# Patient Record
Sex: Male | Born: 1945 | Race: White | Hispanic: No | Smoking: Former smoker
Health system: Southern US, Community
[De-identification: ages and names within clinical notes are randomized; demographics above are authoritative.]

## PROBLEM LIST (undated history)

## (undated) DIAGNOSIS — I739 Peripheral vascular disease, unspecified: Secondary | ICD-10-CM

## (undated) DIAGNOSIS — I83009 Varicose veins of unspecified lower extremity with ulcer of unspecified site: Secondary | ICD-10-CM

## (undated) DIAGNOSIS — N390 Urinary tract infection, site not specified: Secondary | ICD-10-CM

## (undated) DIAGNOSIS — Z515 Encounter for palliative care: Secondary | ICD-10-CM

## (undated) DIAGNOSIS — M109 Gout, unspecified: Secondary | ICD-10-CM

## (undated) DIAGNOSIS — N179 Acute kidney failure, unspecified: Secondary | ICD-10-CM

## (undated) DIAGNOSIS — Z8719 Personal history of other diseases of the digestive system: Secondary | ICD-10-CM

## (undated) DIAGNOSIS — I10 Essential (primary) hypertension: Secondary | ICD-10-CM

## (undated) DIAGNOSIS — E7800 Pure hypercholesterolemia, unspecified: Secondary | ICD-10-CM

## (undated) DIAGNOSIS — N189 Chronic kidney disease, unspecified: Secondary | ICD-10-CM

## (undated) DIAGNOSIS — I5032 Chronic diastolic (congestive) heart failure: Secondary | ICD-10-CM

## (undated) DIAGNOSIS — L02419 Cutaneous abscess of limb, unspecified: Secondary | ICD-10-CM

## (undated) DIAGNOSIS — M1990 Unspecified osteoarthritis, unspecified site: Secondary | ICD-10-CM

## (undated) DIAGNOSIS — I482 Chronic atrial fibrillation, unspecified: Secondary | ICD-10-CM

## (undated) DIAGNOSIS — R739 Hyperglycemia, unspecified: Secondary | ICD-10-CM

## (undated) DIAGNOSIS — R600 Localized edema: Secondary | ICD-10-CM

## (undated) DIAGNOSIS — I4891 Unspecified atrial fibrillation: Secondary | ICD-10-CM

## (undated) DIAGNOSIS — J9601 Acute respiratory failure with hypoxia: Secondary | ICD-10-CM

## (undated) DIAGNOSIS — J810 Acute pulmonary edema: Secondary | ICD-10-CM

## (undated) DIAGNOSIS — L97909 Non-pressure chronic ulcer of unspecified part of unspecified lower leg with unspecified severity: Secondary | ICD-10-CM

## (undated) DIAGNOSIS — R0902 Hypoxemia: Secondary | ICD-10-CM

## (undated) DIAGNOSIS — A419 Sepsis, unspecified organism: Secondary | ICD-10-CM

## (undated) DIAGNOSIS — R609 Edema, unspecified: Secondary | ICD-10-CM

## (undated) DIAGNOSIS — L03119 Cellulitis of unspecified part of limb: Secondary | ICD-10-CM

## (undated) DIAGNOSIS — J9602 Acute respiratory failure with hypercapnia: Secondary | ICD-10-CM

## (undated) DIAGNOSIS — G9340 Encephalopathy, unspecified: Secondary | ICD-10-CM

## (undated) DIAGNOSIS — Z9989 Dependence on other enabling machines and devices: Secondary | ICD-10-CM

## (undated) DIAGNOSIS — G4733 Obstructive sleep apnea (adult) (pediatric): Secondary | ICD-10-CM

## (undated) DIAGNOSIS — D649 Anemia, unspecified: Secondary | ICD-10-CM

## (undated) DIAGNOSIS — Z7189 Other specified counseling: Secondary | ICD-10-CM

## (undated) DIAGNOSIS — L89300 Pressure ulcer of unspecified buttock, unstageable: Secondary | ICD-10-CM

## (undated) HISTORY — DX: Encounter for palliative care: Z51.5

## (undated) HISTORY — DX: Localized edema: R60.0

## (undated) HISTORY — DX: Sepsis, unspecified organism: A41.9

## (undated) HISTORY — DX: Varicose veins of unspecified lower extremity with ulcer of unspecified site: I83.009

## (undated) HISTORY — DX: Acute respiratory failure with hypoxia: J96.01

## (undated) HISTORY — PX: NO PAST SURGERIES: SHX2092

## (undated) HISTORY — DX: Acute respiratory failure with hypercapnia: J96.02

## (undated) HISTORY — DX: Other specified counseling: Z71.89

## (undated) HISTORY — DX: Obstructive sleep apnea (adult) (pediatric): G47.33

## (undated) HISTORY — DX: Dependence on other enabling machines and devices: Z99.89

## (undated) HISTORY — DX: Personal history of other diseases of the digestive system: Z87.19

## (undated) HISTORY — DX: Hypoxemia: R09.02

## (undated) HISTORY — DX: Unspecified atrial fibrillation: I48.91

## (undated) HISTORY — DX: Urinary tract infection, site not specified: N39.0

## (undated) HISTORY — DX: Edema, unspecified: R60.9

## (undated) HISTORY — DX: Encephalopathy, unspecified: G93.40

## (undated) HISTORY — DX: Essential (primary) hypertension: I10

## (undated) HISTORY — DX: Acute pulmonary edema: J81.0

## (undated) HISTORY — DX: Varicose veins of unspecified lower extremity with ulcer of unspecified site: L97.909

---

## 2002-12-08 DIAGNOSIS — I5032 Chronic diastolic (congestive) heart failure: Secondary | ICD-10-CM

## 2002-12-08 HISTORY — DX: Chronic diastolic (congestive) heart failure: I50.32

## 2003-01-27 ENCOUNTER — Ambulatory Visit (HOSPITAL_COMMUNITY): Admission: RE | Admit: 2003-01-27 | Discharge: 2003-01-27 | Payer: Self-pay | Admitting: Internal Medicine

## 2003-01-27 ENCOUNTER — Encounter: Payer: Self-pay | Admitting: Internal Medicine

## 2004-08-08 ENCOUNTER — Ambulatory Visit: Payer: Self-pay | Admitting: *Deleted

## 2004-08-16 ENCOUNTER — Ambulatory Visit: Payer: Self-pay | Admitting: Family Medicine

## 2004-11-20 ENCOUNTER — Ambulatory Visit: Payer: Self-pay | Admitting: Family Medicine

## 2004-12-04 ENCOUNTER — Ambulatory Visit: Payer: Self-pay | Admitting: Family Medicine

## 2005-04-08 ENCOUNTER — Ambulatory Visit: Payer: Self-pay | Admitting: Family Medicine

## 2005-05-01 ENCOUNTER — Ambulatory Visit: Payer: Self-pay | Admitting: Family Medicine

## 2005-06-02 ENCOUNTER — Ambulatory Visit: Payer: Self-pay | Admitting: Family Medicine

## 2011-08-12 ENCOUNTER — Ambulatory Visit: Payer: Self-pay | Admitting: Cardiology

## 2011-09-10 ENCOUNTER — Encounter: Payer: Self-pay | Admitting: Cardiology

## 2011-09-11 ENCOUNTER — Ambulatory Visit: Payer: Self-pay | Admitting: Cardiology

## 2011-09-11 ENCOUNTER — Encounter (HOSPITAL_BASED_OUTPATIENT_CLINIC_OR_DEPARTMENT_OTHER): Payer: Medicare HMO | Attending: Internal Medicine

## 2011-10-09 ENCOUNTER — Encounter (HOSPITAL_BASED_OUTPATIENT_CLINIC_OR_DEPARTMENT_OTHER): Payer: Self-pay

## 2011-11-07 ENCOUNTER — Ambulatory Visit: Payer: Self-pay | Admitting: Cardiology

## 2011-11-27 ENCOUNTER — Ambulatory Visit: Payer: Self-pay | Admitting: Cardiology

## 2012-01-01 ENCOUNTER — Ambulatory Visit: Payer: Self-pay | Admitting: Cardiology

## 2012-01-29 ENCOUNTER — Ambulatory Visit: Payer: Self-pay | Admitting: Cardiology

## 2012-03-04 ENCOUNTER — Ambulatory Visit: Payer: Self-pay | Admitting: Cardiology

## 2012-04-20 ENCOUNTER — Encounter: Payer: Self-pay | Admitting: *Deleted

## 2012-04-26 ENCOUNTER — Ambulatory Visit (INDEPENDENT_AMBULATORY_CARE_PROVIDER_SITE_OTHER): Payer: Self-pay | Admitting: Cardiology

## 2012-04-26 ENCOUNTER — Encounter: Payer: Self-pay | Admitting: Cardiology

## 2012-04-26 VITALS — BP 180/104 | HR 80 | Ht 69.0 in | Wt 323.1 lb

## 2012-04-26 DIAGNOSIS — I509 Heart failure, unspecified: Secondary | ICD-10-CM

## 2012-04-26 DIAGNOSIS — R0602 Shortness of breath: Secondary | ICD-10-CM

## 2012-04-26 DIAGNOSIS — I10 Essential (primary) hypertension: Secondary | ICD-10-CM

## 2012-04-26 DIAGNOSIS — I4891 Unspecified atrial fibrillation: Secondary | ICD-10-CM

## 2012-04-26 MED ORDER — APIXABAN 5 MG PO TABS: 5.0000 mg | ORAL_TABLET | Freq: Two times a day (BID) | ORAL | Status: DC

## 2012-04-26 MED ORDER — CARVEDILOL 25 MG PO TABS: 25.0000 mg | ORAL_TABLET | Freq: Two times a day (BID) | ORAL | Status: DC

## 2012-04-26 MED ORDER — POTASSIUM CHLORIDE 20 MEQ PO PACK: 20.0000 meq | PACK | Freq: Every day | ORAL | Status: DC

## 2012-04-26 MED ORDER — FUROSEMIDE 40 MG PO TABS: 40.0000 mg | ORAL_TABLET | Freq: Every day | ORAL | Status: DC

## 2012-04-26 NOTE — Patient Instructions (Signed)
Your physician recommends that you schedule a follow-up appointment in: 2 weeks with Dr. Shirlee Latch.  (OK to overbook)  Your physician has requested that you have an echocardiogram in 1 week.. Echocardiography is a painless test that uses sound waves to create images of your heart. It provides your doctor with information about the size and shape of your heart and how well your heart's chambers and valves are working. This procedure takes approximately one hour. There are no restrictions for this procedure.  Your physician recommends that you return for lab work in: 1 week  BMET, BNP, Lipids  Stop Metoprolol  Try to get Eliquis (Apixiban) if too expensive, call us to change.  Start Coreg 25mg  twice daily  Lasix 40mg  daily  Potassium chloride daily

## 2012-04-27 ENCOUNTER — Telehealth: Payer: Self-pay | Admitting: Cardiology

## 2012-04-27 DIAGNOSIS — I4891 Unspecified atrial fibrillation: Secondary | ICD-10-CM | POA: Insufficient documentation

## 2012-04-27 NOTE — Telephone Encounter (Addendum)
Per pt he has a bad reaction to is Lebatolol please let Dr. Shirlee Latch. Pt also wanted to let us know that the apixaban is a little expensive can he get something that is cheaper

## 2012-04-27 NOTE — Telephone Encounter (Signed)
Patient called no answer.LMTC. 

## 2012-04-27 NOTE — Assessment & Plan Note (Addendum)
Uncontrolled HTN x years.  Likely this played a significant role in his atrial fibrillation.  As above, stopping metoprolol and using high dose Coreg.  Also starting Lasix.  Will likely need additional medication titration.  I will await echo and LV fxn evaluation to decide on this.

## 2012-04-27 NOTE — Assessment & Plan Note (Addendum)
Jesus [name] is in atrial fibrillation today and was in atrial fibrillation in 8/12.  Rate is controlled on tid metoprolol.  He is not on anticoagulation. CHADSVASC score is at least 3, so he ought to be on anticoagulation.  Transportation was an issue for coumadin.   - Start apixaban 5 mg bid (check creatinine today).  If too expensive, will assess for rivaroxaban or dabigatran.  - Given severe HTN, will stop metoprolol and have him take Coreg 25 mg bid.  - Cardioversion would be an option in the future (needs at least 3 wks apixaban prior).

## 2012-04-27 NOTE — Assessment & Plan Note (Addendum)
Patient is significantly volume overloaded on exam today with elevate neck veins and impressive edema.  He has chronic orthopnea.  I am going to start him on Lasix 40 mg daily + KCl 20 mEq daily.  I will get an echo to assess LV systolic function.  BMET/BNP in 1 week.   I will check lipids.

## 2012-04-27 NOTE — Progress Notes (Signed)
PCP: Dr. Shanda Bumps Copland  66 yo with history of atrial fibrillation and CHF presents for cardiology evaluation.  Jesus [name] has a long history of lower extremity edema.  He was told as early as 2003 that he has CHF but never saw a cardiologist prior to today.  He was seen by Dr. Patsy Lager in 8/12 at [facility] and was noted to be in atrial fibrillation.  CXR showed cardiomegaly.  He was started on coumadin but stopped it and did not followup again at Urgent Care due to transportation problems for INR checks.  He is in atrial fibrillation still today.  He is not very mobile because of obesity and severe bilateral knee pain from arthritis.  He has some exertional dyspnea but greater limiting factor is knee pain.  He has chronic orthopnea and sleeps in a recliner.  No recent PND.  No chest pain.  BP today is 180/104, which he says is good for him. Usually when checked, SBP runs in the 200s-210s.   ECG: atrial fibrillation at 83, LAFB, lateral TWIs, poor anterior R wave progression.   Labs (8/12): K 4.6, creatinine 0.62, BNP 227, HCT 39.9  PMH: 1. Obesity 2. Atrial fibrillation: First noted in 8/12, probably persistent.  3. CHF 4. HTN: since late teens, poor control.  5. Chronic lower extremity edema.  6. OA knees: severe.   FH: Father with atrial fibrillation.   SH: Divorced.  Lives in [place].  Used to be an appliance repairman but now on disability.  Nonsmoker.    ROS: All systems reviewed and negative except as per HPI.   Current Outpatient Prescriptions  Medication Sig Dispense Refill  . lisinopril (PRINIVIL,ZESTRIL) 40 MG tablet Take 0.5 tablets (20 mg total) by mouth 2 (two) times daily. 1/2 tablet dail      . apixaban (ELIQUIS) 5 MG TABS tablet Take 1 tablet (5 mg total) by mouth 2 (two) times daily.  60 tablet  3  . carvedilol (COREG) 25 MG tablet Take 1 tablet (25 mg total) by mouth 2 (two) times daily with a meal.  60 tablet  3  . furosemide (LASIX) 40 MG tablet Take 1 tablet (40  mg total) by mouth daily.  30 tablet  3  . potassium chloride (K-LOR) 20 MEQ packet Take 20 mEq by mouth daily.  30 tablet  3    BP 180/104  Pulse 80  Ht 5\' 9"  (1.753 m)  Wt 323 lb 1.9 oz (146.566 kg)  BMI 47.72 kg/m2 General: NAD, obese.  Neck: JVP 12-14 cm, no thyromegaly or thyroid nodule.  Lungs: Clear to auscultation bilaterally with normal respiratory effort. CV: Nondisplaced PMI.  Heart irregular S1/S2, no S3/S4, 1/6 SEM RUSB. 2+ chronic edema to knees bilaterally.  No carotid bruit.  Unable to palpate pedal pulses due to edema. .  Abdomen: Soft, nontender, no hepatosplenomegaly, no distention.  Skin: Intact without lesions or rashes.  Neurologic: Alert and oriented x 3.  Psych: Normal affect. Extremities: No clubbing or cyanosis.  HEENT: Normal.

## 2012-04-28 NOTE — Telephone Encounter (Signed)
Patient called back was told Dr.McLean wanted to know name of blood pressure medication he could not take.States he took Labetalol 10 months ago but had to stop due to it making him feel bad.Also stated apixaban is too expensive.Fowarded to Dr.McLean for advice.

## 2012-04-28 NOTE — Telephone Encounter (Signed)
Patient retuning nurse CP call, he can be reached at [phone]. Patient will be home for the next hour

## 2012-04-28 NOTE — Telephone Encounter (Signed)
Patient called phone rings busy. 

## 2012-04-28 NOTE — Telephone Encounter (Signed)
Patient called no answer.LMTC. 

## 2012-04-28 NOTE — Telephone Encounter (Signed)
Can check with his insurance on Xarelto 20 mg daily.  If that is too expensive, will need to do coumadin.

## 2012-04-30 NOTE — Telephone Encounter (Signed)
Patient called no answer.LMTC. 

## 2012-05-05 ENCOUNTER — Encounter: Payer: Self-pay | Admitting: Cardiology

## 2012-05-05 NOTE — Telephone Encounter (Signed)
LMTCB

## 2012-05-06 MED ORDER — APIXABAN 5 MG PO TABS: 5.0000 mg | ORAL_TABLET | Freq: Two times a day (BID) | ORAL | Status: DC

## 2012-05-06 MED ORDER — RIVAROXABAN 20 MG PO TABS: 20.0000 mg | ORAL_TABLET | Freq: Every day | ORAL | Status: DC

## 2012-05-06 NOTE — Telephone Encounter (Signed)
Spoke with pt again. Xarelto 20mg  is going to be $45 the same as apixaban. Pt is still trying to make a decision about apixaban/ xarelto or coumadin. We discussed and I emphasized the importance of taking one of these medications and stroke risk with at fib. Pt states for right now he will take apixaban but will call me back today if he decides on coumadin. Pt states he is not having a problem with any other medication. I emphasized the importance of coming for lab and echo 05/07/12.

## 2012-05-06 NOTE — Telephone Encounter (Signed)
Spoke with pt

## 2012-05-07 ENCOUNTER — Ambulatory Visit (INDEPENDENT_AMBULATORY_CARE_PROVIDER_SITE_OTHER): Payer: 59 | Admitting: *Deleted

## 2012-05-07 ENCOUNTER — Other Ambulatory Visit: Payer: Self-pay

## 2012-05-07 ENCOUNTER — Telehealth: Payer: Self-pay | Admitting: Cardiology

## 2012-05-07 ENCOUNTER — Ambulatory Visit (HOSPITAL_COMMUNITY): Payer: Medicare FFS | Attending: Cardiology

## 2012-05-07 DIAGNOSIS — I10 Essential (primary) hypertension: Secondary | ICD-10-CM

## 2012-05-07 DIAGNOSIS — R0989 Other specified symptoms and signs involving the circulatory and respiratory systems: Secondary | ICD-10-CM | POA: Insufficient documentation

## 2012-05-07 DIAGNOSIS — I4891 Unspecified atrial fibrillation: Secondary | ICD-10-CM

## 2012-05-07 DIAGNOSIS — I509 Heart failure, unspecified: Secondary | ICD-10-CM | POA: Insufficient documentation

## 2012-05-07 DIAGNOSIS — R0609 Other forms of dyspnea: Secondary | ICD-10-CM | POA: Insufficient documentation

## 2012-05-07 DIAGNOSIS — I517 Cardiomegaly: Secondary | ICD-10-CM | POA: Insufficient documentation

## 2012-05-07 DIAGNOSIS — R609 Edema, unspecified: Secondary | ICD-10-CM

## 2012-05-07 LAB — BASIC METABOLIC PANEL
BUN: 15 mg/dL (ref 6–23)
Chloride: 90 mEq/L — ABNORMAL LOW (ref 96–112)
Potassium: 3.3 mEq/L — ABNORMAL LOW (ref 3.5–5.1)

## 2012-05-07 LAB — LIPID PANEL
Cholesterol: 164 mg/dL (ref 0–200)
HDL: 30.4 mg/dL — ABNORMAL LOW (ref >39.00)
VLDL: 17.4 mg/dL (ref 0.0–40.0)

## 2012-05-07 LAB — BRAIN NATRIURETIC PEPTIDE: Pro B Natriuretic peptide (BNP): 388 pg/mL — ABNORMAL HIGH (ref 0.0–100.0)

## 2012-05-07 NOTE — Telephone Encounter (Signed)
Error

## 2012-05-11 ENCOUNTER — Other Ambulatory Visit: Payer: Self-pay | Admitting: *Deleted

## 2012-05-11 DIAGNOSIS — I429 Cardiomyopathy, unspecified: Secondary | ICD-10-CM

## 2012-05-12 ENCOUNTER — Other Ambulatory Visit (INDEPENDENT_AMBULATORY_CARE_PROVIDER_SITE_OTHER): Payer: 59

## 2012-05-12 DIAGNOSIS — I429 Cardiomyopathy, unspecified: Secondary | ICD-10-CM

## 2012-05-12 DIAGNOSIS — I10 Essential (primary) hypertension: Secondary | ICD-10-CM

## 2012-05-12 LAB — BASIC METABOLIC PANEL
BUN: 11 mg/dL (ref 6–23)
Calcium: 9 mg/dL (ref 8.4–10.5)
GFR: 114.27 mL/min (ref >60.00)
Glucose, Bld: 142 mg/dL — ABNORMAL HIGH (ref 70–99)
Sodium: 137 mEq/L (ref 135–145)

## 2012-05-18 ENCOUNTER — Encounter: Payer: Self-pay | Admitting: Cardiology

## 2012-05-18 ENCOUNTER — Ambulatory Visit (INDEPENDENT_AMBULATORY_CARE_PROVIDER_SITE_OTHER): Payer: 59 | Admitting: Cardiology

## 2012-05-18 VITALS — BP 165/100 | HR 78 | Ht 69.0 in | Wt 336.0 lb

## 2012-05-18 DIAGNOSIS — I4891 Unspecified atrial fibrillation: Secondary | ICD-10-CM

## 2012-05-18 DIAGNOSIS — I509 Heart failure, unspecified: Secondary | ICD-10-CM

## 2012-05-18 DIAGNOSIS — I10 Essential (primary) hypertension: Secondary | ICD-10-CM

## 2012-05-18 MED ORDER — FUROSEMIDE 40 MG PO TABS: 40.0000 mg | ORAL_TABLET | Freq: Two times a day (BID) | ORAL | Status: DC

## 2012-05-18 MED ORDER — AMLODIPINE BESYLATE 5 MG PO TABS: 5.0000 mg | ORAL_TABLET | Freq: Every day | ORAL | Status: DC

## 2012-05-18 MED ORDER — WARFARIN SODIUM 5 MG PO TABS: ORAL_TABLET | ORAL | Status: DC

## 2012-05-18 MED ORDER — POTASSIUM CHLORIDE CRYS ER 20 MEQ PO TBCR: EXTENDED_RELEASE_TABLET | ORAL | Status: DC

## 2012-05-18 NOTE — Assessment & Plan Note (Signed)
Resistant HTN.  BP better but still quite high.  -  I will have him start amlodipine 5 mg daily in addition to his other meds today.   -  Resistant HTN could be due to high levels of aldosterone.  Next step will be addition of spironolactone.  - Will need eventual evaluation for OSA.    He will followup with the PA in 2 weeks and me in 1 month.

## 2012-05-18 NOTE — Assessment & Plan Note (Signed)
Jesus [name] is in atrial fibrillation still today.  Rate is controlled on Coreg.  He is still not on anticoagulation. CHADSVASC score is at least 3, so he ought to be on anticoagulation.   - Unable to afford apixaban or Xarelto.  Will start coumadin.   - Coreg for rate control.   - Cardioversion would be an option in the future (needs at least 3 wks apixaban prior).  Holding NSR may help with CHF.

## 2012-05-18 NOTE — Progress Notes (Signed)
PCP: Dr. Shanda Bumps Copland  66 yo with history of atrial fibrillation and diastolic CHF presents for cardiology evaluation.  Jesus [name] has a long history of lower extremity edema.  He was told as early as 2003 that he has CHF.  He was seen by Dr. Patsy Lager in 8/12 at [facility] and was noted to be in atrial fibrillation.  CXR showed cardiomegaly.  He was started on coumadin but stopped it and did not followup again at Urgent [facility] due to transportation problems for INR checks.  He has been in atrial fibrillation when seen in this office.  He is not very mobile because of obesity and severe bilateral knee pain from arthritis.  He has some exertional dyspnea but greater limiting factor is knee pain.  He has chronic orthopnea and sleeps in a recliner.  No recent PND.  No chest pain.  At last appointment, I adjusted his BP regimen.  BP is still high but seems to be lower on average.  I also started him on Lasix at last appointment.  Per patient, this has not affected his breathing or his lower extremity edema.  He now has some venous stasis ulcerations on his right lower leg.  He followsup with Dr. Lajoyce Corners for this.  I wanted him to start either apixaban or Xarelto for anticoagulation, but they were too expensive.  He is still not on a blood thinner.   Labs (8/12): K 4.6, creatinine 0.62, BNP 227, HCT 39.9 Labs (5/13): LDL 116, HDL 30, proBNP 388 Labs (6/13): K 3.1, creatinine 0.7, proBNP 282, immunofixation with no monoclonal protein.   PMH: 1. Obesity 2. Atrial fibrillation: First noted in 8/12, probably persistent.  3. Diastolic CHF: Echo (5/13) with severe LVH, biatrial enlargement, EF 50-55%.  4. HTN: since late teens, poor control.  5. Chronic lower extremity edema.  6. OA knees: severe.   FH: Father with atrial fibrillation.   SH: Divorced.  Lives in [place].  Used to be an appliance repairman but now on disability.  Nonsmoker.    ROS: All systems reviewed and negative except as per HPI.    Current Outpatient Prescriptions  Medication Sig Dispense Refill  . carvedilol (COREG) 25 MG tablet Take 1 tablet (25 mg total) by mouth 2 (two) times daily with a meal.  60 tablet  3  . lisinopril (PRINIVIL,ZESTRIL) 40 MG tablet Take 20 mg by mouth 2 (two) times daily.       Marland Kitchen amLODipine (NORVASC) 5 MG tablet Take 1 tablet (5 mg total) by mouth daily.  30 tablet  6  . furosemide (LASIX) 40 MG tablet Take 1 tablet (40 mg total) by mouth 2 (two) times daily.  60 tablet  6  . potassium chloride SA (K-DUR,KLOR-CON) 20 MEQ tablet Take 2 tablets twice a day  120 tablet  6  . warfarin (COUMADIN) 5 MG tablet Take one daily or as directed  30 tablet  0    BP 165/100  Pulse 78  Ht 5\' 9"  (1.753 m)  Wt 152.409 kg (336 lb)  BMI 49.62 kg/m2  SpO2 95% General: NAD, obese.  Neck: JVP 10 cm, no thyromegaly or thyroid nodule.  Lungs: Clear to auscultation bilaterally with normal respiratory effort. CV: Nondisplaced PMI.  Heart irregular S1/S2, no S3/S4, 1/6 SEM RUSB. 2+ chronic edema to knees bilaterally.  No carotid bruit.  Unable to palpate pedal pulses due to edema. .  Abdomen: Soft, nontender, no hepatosplenomegaly, no distention.  Skin: Right lower leg is erythematous  and has probable venous stasis ulcerations.   Neurologic: Alert and oriented x 3.  Psych: Normal affect. Extremities: No clubbing or cyanosis.

## 2012-05-18 NOTE — Assessment & Plan Note (Signed)
Acute on chronic diastolic CHF.  He continues to be volume overloaded on exam.  He has venous stasis ulcers on his right lower leg.  This leg is also erythematous though he says that this is a considerable improvement.  - Increase Lasix to 40 mg bid and KCl to 40 mEq bid.   - BMET/BNP on Monday.

## 2012-05-18 NOTE — Patient Instructions (Signed)
Increase lasix(furosemide) to 40mg  twice a day.  Increase KCL(potassium) to 40 mEq twice a day. This will be two 20 mEq tablets twice a day.  Start amlodipine 5mg  daily.  Start coumadin(warfarin) 5mg  daily.  Schedule an appointment in [facility] on Monday June 17,2013.  Your physician recommends that you return for lab work on Monday June 17,2013.---BMET/BNP  Your physician recommends that you schedule a follow-up appointment in: 2 weeks with Jesus [name]   Your physician recommends that you schedule a follow-up appointment in: 1 month with Jesus [name].

## 2012-05-19 ENCOUNTER — Telehealth: Payer: Self-pay | Admitting: *Deleted

## 2012-05-19 NOTE — Telephone Encounter (Signed)
I have attempted to send new prescriptions for amlodipine, warfarin, lasix, and KCl into [facility] since yesterday. I have been in contact with the pt several times to let him know I have not been in touch with [facility] either electronically or by telephone to get these prescriptions to them. Pt declined to have these filled at another pharmacy. Pt told me he would go to [facility] to see what was going on. I have not been able to contact the pt since earlier today.
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
## 2012-05-24 ENCOUNTER — Other Ambulatory Visit (INDEPENDENT_AMBULATORY_CARE_PROVIDER_SITE_OTHER): Payer: 59

## 2012-05-24 ENCOUNTER — Ambulatory Visit (INDEPENDENT_AMBULATORY_CARE_PROVIDER_SITE_OTHER): Payer: 59 | Admitting: *Deleted

## 2012-05-24 DIAGNOSIS — I4891 Unspecified atrial fibrillation: Secondary | ICD-10-CM

## 2012-05-24 DIAGNOSIS — I10 Essential (primary) hypertension: Secondary | ICD-10-CM

## 2012-05-24 DIAGNOSIS — Z7901 Long term (current) use of anticoagulants: Secondary | ICD-10-CM

## 2012-05-24 LAB — POCT INR: INR: 1.3

## 2012-05-24 NOTE — Patient Instructions (Addendum)

## 2012-05-25 LAB — BASIC METABOLIC PANEL
CO2: 30 mEq/L (ref 19–32)
Calcium: 9.1 mg/dL (ref 8.4–10.5)
Chloride: 95 mEq/L — ABNORMAL LOW (ref 96–112)
Creatinine, Ser: 0.7 mg/dL (ref 0.4–1.5)
Glucose, Bld: 91 mg/dL (ref 70–99)

## 2012-05-26 ENCOUNTER — Telehealth: Payer: Self-pay | Admitting: Cardiology

## 2012-05-26 NOTE — Telephone Encounter (Signed)
Spoke with pt. Pt states he started amlodipine about 7 days ago. He has swelling in both legs that pt states began the day he started amlodipine. The swelling in his legs has gradually progressed to above his knees to his testicles. Pt states he increased furosemide the same day he started amlodipine. Pt denies any increase in SOB. Pt has not taken amlodipine today. I will forward to Dr Shirlee Latch for review and recommendations.

## 2012-05-26 NOTE — Telephone Encounter (Signed)
New msg Pt wants to know about side effects from amlodipine. He is having some swelling. Please call back

## 2012-05-26 NOTE — Telephone Encounter (Signed)
Have him stop amlodipine (swelling is a side effect) and start spironolactone 25 mg daily with BMET in 1 week.

## 2012-05-27 ENCOUNTER — Other Ambulatory Visit: Payer: 59

## 2012-05-27 NOTE — Telephone Encounter (Signed)
LMTCB

## 2012-05-28 ENCOUNTER — Other Ambulatory Visit: Payer: Self-pay | Admitting: *Deleted

## 2012-05-28 NOTE — Telephone Encounter (Signed)
Spoke with pt. He has not taken amlodipine since 05/26/12. His swelling may have improved slightly. Pt states he does not have any other symptoms.  He will not take anymore amlodipine. He does not want to start spironolactone right now. He states he will call me back the first of the week to let me know his decision about starting spironolactone.

## 2012-05-28 NOTE — Telephone Encounter (Signed)
He is not going to get better until he gets his BP controlled.  Would call him next week to see what he decides.

## 2012-05-31 NOTE — Telephone Encounter (Signed)
LMTCB

## 2012-06-01 ENCOUNTER — Ambulatory Visit: Payer: 59 | Admitting: Physician Assistant

## 2012-06-01 ENCOUNTER — Emergency Department (HOSPITAL_COMMUNITY)
Admission: EM | Admit: 2012-06-01 | Discharge: 2012-06-01 | Disposition: A | Discharge status: Home / Self Care | Payer: Medicare HMO | Attending: Emergency Medicine | Admitting: Emergency Medicine

## 2012-06-01 ENCOUNTER — Encounter (HOSPITAL_COMMUNITY): Payer: Self-pay | Admitting: *Deleted

## 2012-06-01 DIAGNOSIS — I4891 Unspecified atrial fibrillation: Secondary | ICD-10-CM | POA: Insufficient documentation

## 2012-06-01 DIAGNOSIS — I509 Heart failure, unspecified: Secondary | ICD-10-CM | POA: Insufficient documentation

## 2012-06-01 DIAGNOSIS — Z7901 Long term (current) use of anticoagulants: Secondary | ICD-10-CM | POA: Insufficient documentation

## 2012-06-01 DIAGNOSIS — Z79899 Other long term (current) drug therapy: Secondary | ICD-10-CM | POA: Insufficient documentation

## 2012-06-01 DIAGNOSIS — N5089 Other specified disorders of the male genital organs: Secondary | ICD-10-CM | POA: Insufficient documentation

## 2012-06-01 DIAGNOSIS — R600 Localized edema: Secondary | ICD-10-CM

## 2012-06-01 DIAGNOSIS — E669 Obesity, unspecified: Secondary | ICD-10-CM | POA: Insufficient documentation

## 2012-06-01 DIAGNOSIS — R609 Edema, unspecified: Secondary | ICD-10-CM | POA: Insufficient documentation

## 2012-06-01 DIAGNOSIS — I10 Essential (primary) hypertension: Secondary | ICD-10-CM | POA: Insufficient documentation

## 2012-06-01 LAB — BASIC METABOLIC PANEL
BUN: 7 mg/dL (ref 6–23)
CO2: 32 mEq/L (ref 19–32)
Calcium: 9.5 mg/dL (ref 8.4–10.5)
Chloride: 88 mEq/L — ABNORMAL LOW (ref 96–112)
Creatinine, Ser: 0.62 mg/dL (ref 0.50–1.35)
GFR calc Af Amer: >90 mL/min (ref >90)
GFR calc non Af Amer: >90 mL/min (ref >90)
Glucose, Bld: 127 mg/dL — ABNORMAL HIGH (ref 70–99)
Potassium: 2.9 mEq/L — ABNORMAL LOW (ref 3.5–5.1)
Sodium: 131 mEq/L — ABNORMAL LOW (ref 135–145)

## 2012-06-01 LAB — URINALYSIS, ROUTINE W REFLEX MICROSCOPIC
Bilirubin Urine: NEGATIVE
Glucose, UA: NEGATIVE mg/dL
Hgb urine dipstick: NEGATIVE
Ketones, ur: NEGATIVE mg/dL
Leukocytes, UA: NEGATIVE
Nitrite: NEGATIVE
Protein, ur: 100 mg/dL — AB
Specific Gravity, Urine: 1.011 (ref 1.005–1.030)
Urobilinogen, UA: 1 mg/dL (ref 0.0–1.0)
pH: 5.5 (ref 5.0–8.0)

## 2012-06-01 LAB — PROTIME-INR
INR: 1.23 (ref 0.00–1.49)
Prothrombin Time: 15.8 seconds — ABNORMAL HIGH (ref 11.6–15.2)

## 2012-06-01 LAB — URINE MICROSCOPIC-ADD ON

## 2012-06-01 MED ORDER — POTASSIUM CHLORIDE CRYS ER 20 MEQ PO TBCR
60.0000 meq | EXTENDED_RELEASE_TABLET | Freq: Once | ORAL | Status: AC
  Administered 2012-06-01: 60 meq via ORAL
  Filled 2012-06-01: qty 3

## 2012-06-01 MED ORDER — FUROSEMIDE 10 MG/ML IJ SOLN
80.0000 mg | Freq: Once | INTRAMUSCULAR | Status: AC
  Administered 2012-06-01: 80 mg via INTRAVENOUS
  Filled 2012-06-01: qty 8

## 2012-06-01 NOTE — ED Notes (Signed)
Patient states that he has had increase in groin swelling since Friday.  Pt was recently started on new medication and has had increase in swelling in his legs, abdomen and testicles.  Pt denies any increase in SOB.  Pt complaining of pain to his groin.

## 2012-06-01 NOTE — Discharge Instructions (Signed)
Take your medications are prescribed. Your lasix (furosemide) will help with your swelling. You need to take your coumadin (warfarin) or other medications as prescribed as well. I encourage you to discuss with your PCP and cardiologist more if you have further concerns with any medications you are prescribed.  Peripheral Edema You have swelling in your legs (peripheral edema). This swelling is due to excess accumulation of salt and water in your body. Edema may be a sign of heart, kidney or liver disease, or a side effect of a medication. It may also be due to problems in the leg veins. Elevating your legs and using special support stockings may be very helpful, if the cause of the swelling is due to poor venous circulation. Avoid long periods of standing, whatever the cause. Treatment of edema depends on identifying the cause. Chips, pretzels, pickles and other salty foods should be avoided. Restricting salt in your diet is almost always needed. Water pills (diuretics) are often used to remove the excess salt and water from your body via urine. These medicines prevent the kidney from reabsorbing sodium. This increases urine flow. Diuretic treatment may also result in lowering of potassium levels in your body. Potassium supplements may be needed if you have to use diuretics daily. Daily weights can help you keep track of your progress in clearing your edema. You should call your caregiver for follow up care as recommended. SEEK IMMEDIATE MEDICAL CARE IF:   You have increased swelling, pain, redness, or heat in your legs.   You develop shortness of breath, especially when lying down.   You develop chest or abdominal pain, weakness, or fainting.   You have a fever.  Document Released: 01/01/2005 Document Revised: 11/13/2011 Document Reviewed: 12/12/2009 [facility] Patient Information 2012 [place], [place].  Edema Edema is a buildup of fluids. It is most common in the feet, ankles, and legs. This  happens more as a person ages. It may affect one or both legs. HOME CARE   Raise (elevate) the legs or ankles above the level of the heart while lying down.   Avoid sitting or standing still for a long time.   Exercise the legs to help the puffiness (swelling) go down.   A low-salt diet may help lessen the puffiness.   Only take medicine as told by your doctor.  GET HELP RIGHT AWAY IF:   You develop shortness of breath or chest pain.   You cannot breathe when you lie down.   You have more puffiness that does not go away with treatment.   You develop pain or redness in the areas that are puffy.   You have a temperature by mouth above 102 F (38.9 C), not controlled by medicine.   You gain 3 lb/1.4 kg or more in 1 day or 5 lb/2.3 kg in a week.  MAKE SURE YOU:   Understand these instructions.   Will watch your condition.   Will get help right away if you are not doing well or get worse.  Document Released: 05/12/2008 Document Revised: 11/13/2011 Document Reviewed: 05/12/2008 [facility] Patient Information 2012 [place], [place].Scrotal Swelling Scrotal swelling may occur on one or both sides of the scrotum. Pain may also occur with swelling. Early evaluation with your caregiver and treatment is important.  CAUSES   Injury.   Infection.   An ingrown hair or abrasion in the area.   Repeated rubbing from tight-fitting underwear.   Poor hygiene.   A weakened area in the muscles around the  groin (hernia). A hernia can allow abdominal contents to push into the scrotum.   Fluid around the testicle (hydrocele).   Enlarged vein around the testicle (varicocele).   Certain medical treatments or existing conditions.   A recent genital surgery or procedure.   The spermatic cord becomes twisted in the scrotum, which cuts off blood supply (testicular torsion).   Testicular cancer.  DIAGNOSIS A physical exam and medical history may be performed. You will be asked questions  about recent activity and where and when the swelling began. Further tests may be performed to confirm the diagnosis, such as an imaging test (ultrasound or MRI). TREATMENT Treatment will depend on the cause of the swelling. Treatment may include:  Self care (rest, ice, limiting activity, scrotal support).   Pain medicine.   Taking medicines to treat an infection.   Surgery or help from a specialist (urologist).  HOME CARE INSTRUCTIONS  Rest and limit activity until the swelling goes away. Lying down is the preferred position.   Put ice on the scrotum.   Put ice in a plastic bag.   Place a towel between your skin and the bag.   Leave the ice on for 15 to 20 minutes, 3 to 4 times a day for 1 to 2 days.   Place a rolled towel under the testicles for support.   Wear loose-fitting clothing or an athletic support cup for comfort.   Take all medicines as directed by your caregiver.   Perform a monthly self-exam of the scrotum and penis. Feel for changes. Ask your caregiver how to perform a monthly self-exam if you are unsure.  There are a few things you can do to help prevent injury or infection that can lead to swelling:  Wear an athletic support and protective cup during sports activities.   Practice safe sex. Wear condoms.   Follow all of your caregiver's recommendations after a surgical procedure. Discuss appropriate time frames for healing and bedrest with your surgeon.   Get vaccinated against mumps, measles, and rubella (MMR).   Use good body mechanics during activity and when lifting heavy objects. Avoid bearing down or straining.   Drink enough fluids to keep your urine clear or pale yellow. Always empty the bladder completely when urinating to avoid infection.  Finding out the results of your test Ask when your test results will be ready. Make sure you get your test results. SEEK MEDICAL CARE IF:  You have a sudden (acute) onset of pain that is persistent and not  improving.   You notice a heavy feeling or fluid in the scrotum.   You have pain or burning while urinating.   You have blood in the urine or semen.   You feel a lump around the testicle.   You notice that one testicle is larger than the other (slight variation is normal).   You have a persistent dull ache or pain in the groin or scrotum.   You need instruction on performing a monthly self-exam.  SEEK IMMEDIATE MEDICAL CARE IF:  The pain does not go away or becomes severe.   You have a fever.   You have pain or vomiting that cannot be controlled.   You notice significant redness or swelling of one or both sides of the scrotum.  MAKE SURE YOU:  Understand these instructions.   Will watch your condition.   Will get help right away if you are not doing well or get worse.  Document Released: 12/27/2010 Document  Revised: 11/13/2011 Document Reviewed: 12/27/2010 [facility] Patient Information 2012 [place], [place].Edema Edema is a buildup of fluids. It is most common in the feet, ankles, and legs. This happens more as a person ages. It may affect one or both legs. HOME CARE   Raise (elevate) the legs or ankles above the level of the heart while lying down.   Avoid sitting or standing still for a long time.   Exercise the legs to help the puffiness (swelling) go down.   A low-salt diet may help lessen the puffiness.   Only take medicine as told by your doctor.  GET HELP RIGHT AWAY IF:   You develop shortness of breath or chest pain.   You cannot breathe when you lie down.   You have more puffiness that does not go away with treatment.   You develop pain or redness in the areas that are puffy.   You have a temperature by mouth above 102 F (38.9 C), not controlled by medicine.   You gain 3 lb/1.4 kg or more in 1 day or 5 lb/2.3 kg in a week.  MAKE SURE YOU:   Understand these instructions.   Will watch your condition.   Will get help right away if you are not  doing well or get worse.  Document Released: 05/12/2008 Document Revised: 11/13/2011 Document Reviewed: 05/12/2008 [facility] Patient Information 2012 [place], [place].

## 2012-06-01 NOTE — ED Provider Notes (Signed)
History    66 rolled male with worsening lower extremity edema. Patient has a history of edema but has been worsening over the last several days. Patient's medications recently changed. Was recently started amlodipine but this was just stopped yesterday because it felt it may be contributing. Patient took himself off several his medications out of frustration. He stopped taking multiple medications including his Lasix and Coumadin. Patient with no other complaints. Denies any chest pain or shortness of breath. No fevers or chills. No urinary complaints.  CSN: [id number]  Arrival date & time 06/01/12  4098   First MD Initiated Contact with Patient 06/01/12 [id number]      Chief Complaint  Patient presents with  . Groin Swelling    (Consider location/radiation/quality/duration/timing/severity/associated sxs/prior treatment) HPI  Past Medical History  Diagnosis Date  . Hypertension   . Obese   . H/O: GI bleed   . Venous stasis ulcer   . Atrial fibrillation   . Edema of lower extremity   . CHF (congestive heart failure)     History reviewed. No pertinent past surgical history.  Family History  Problem Relation Age of Onset  . Arrhythmia Father     History  Substance Use Topics  . Smoking status: Never Smoker   . Smokeless tobacco: Not on file  . Alcohol Use: Not on file      Review of Systems   Review of symptoms negative unless otherwise noted in HPI.   Allergies  Amlodipine besylate  Home Medications   Current Outpatient Rx  Name Route Sig Dispense Refill  . CARVEDILOL 25 MG PO TABS Oral Take 25 mg by mouth 2 (two) times daily with a meal.    . FUROSEMIDE 40 MG PO TABS Oral Take 40 mg by mouth 2 (two) times daily.    Marland Kitchen LISINOPRIL 40 MG PO TABS Oral Take 20 mg by mouth 2 (two) times daily. Either takes 1/2 tablet (20mg ) twice a day or 1 tablet (40mg ) once a day    . METOPROLOL TARTRATE 100 MG PO TABS Oral Take 100 mg by mouth 2 (two) times daily.    Marland Kitchen POTASSIUM  CHLORIDE CRYS ER 20 MEQ PO TBCR Oral Take 40 mEq by mouth 2 (two) times daily.    . WARFARIN SODIUM 5 MG PO TABS Oral Take 5 mg by mouth daily.      BP 193/96  Pulse 87  Temp 97.9 F (36.6 C) (Oral)  Resp 18  SpO2 98%  Physical Exam  Nursing note and vitals reviewed. Constitutional: No distress.       Laying in bed. NAD. Obese.  HENT:  Head: Normocephalic and atraumatic.  Eyes: Conjunctivae are normal. Right eye exhibits no discharge. Left eye exhibits no discharge.  Neck: Neck supple.  Cardiovascular: Normal rate, regular rhythm and normal heart sounds.  Exam reveals no gallop and no friction rub.   No murmur heard. Pulmonary/Chest: Effort normal and breath sounds normal. No respiratory distress.  Abdominal: Soft. He exhibits no distension. There is no tenderness.  Musculoskeletal: He exhibits edema. He exhibits no tenderness.       b/l LE wrapped. Edema extending up thighs and scrotum.  Neurological: He is alert.  Skin: Skin is warm and dry.  Psychiatric: He has a normal mood and affect. His behavior is normal. Thought content normal.    ED Course  Procedures (including critical care time)  Labs Reviewed  BASIC METABOLIC PANEL - Abnormal; Notable for the following:    Sodium  131 (*)     Potassium 2.9 (*)     Chloride 88 (*)     Glucose, Bld 127 (*)     All other components within normal limits  URINALYSIS, ROUTINE W REFLEX MICROSCOPIC - Abnormal; Notable for the following:    Protein, ur 100 (*)     All other components within normal limits  PROTIME-INR - Abnormal; Notable for the following:    Prothrombin Time 15.8 (*)     All other components within normal limits  URINE MICROSCOPIC-ADD ON - Abnormal; Notable for the following:    Casts HYALINE CASTS (*)     All other components within normal limits   No results found.   1. Bilateral leg edema   2. Scrotal edema       MDM  66 year old male with worsening lower extremity edema and scrotal edema. Suspect  multifactorial. Possibly partially related to amlodipine use which was just discontinued. Patient has also chosen to stop taking his Lasix and Coumadin because he was frustrated and felt that they may be continuing to his edema as well. He did not discuss this with his prescribing physician first. Patient is not sure why he is taking the Coumadin, but I assume is because of his history of atrial fibrillation. Discussed with patient the risk of stroke and the benefit of anticoagulation. INR is normal consistent with his history of noncompliance. Pt states he will begin retaking.  Discussed the need to have an INR rechecked at the end of this week or early next week. Patient seems reasonable now that he better understands benefits of taking and that not contributing to his swelling. Explained to patient that by also not taking Lasix is probably actually making symptoms worse. He was given an IV dose here in emergency room. Understands the benefit taking this medicine as prescribed. Hypokalemia. This was repleted. Patient was instructed the importance of taking his potassium supplements at home as well. Hypertension remains poorly controlled at this time but no indication for emergent reduction. Feel that medications changes or adjustments are more appropriate for those that can continue to follow him as an outpatient. Patient does have edema, but he has no respiratory complaints. Renal function is normal. Pt does drink beer but he has no known history of hepatic dysfunction. Doubt DVT. Other medication on patient's med list was reviewed with him as well. I think a large part of his frustration is simply education. Seems to have a better understanding at this time expresses intent on being compliant. Encouraged to follow-up with his PCP and cardiologist and that he needs to discuss any concerns he has in the future concerning medicine or other aspects of his care.        Raeford Razor, MD 06/06/12 2340

## 2012-06-01 NOTE — ED Notes (Signed)
R.N notified of abnormal BP, patient a little anxious, will recheck.

## 2012-06-01 NOTE — ED Notes (Signed)
Patients abdomen is distended at this time.  Pt states that it is non tender. Pt abdomen is soft upon palpation.  Daughter states that abdomen is distended. Pt has a wound care nurse that keeps his lower extremities wrapped with coban to help reduced lower leg weeping.  Pt states that he has noticed swelling in bilateral thighs. Pitting edema noted at this time.

## 2012-06-02 ENCOUNTER — Inpatient Hospital Stay (HOSPITAL_COMMUNITY)
Admission: AD | Admit: 2012-06-02 | Discharge: 2012-06-11 | DRG: 292 | Disposition: A | Discharge status: Home Health | Payer: Medicare HMO | Source: Ambulatory Visit | Attending: Cardiology | Admitting: Cardiology

## 2012-06-02 ENCOUNTER — Encounter (HOSPITAL_COMMUNITY): Payer: Self-pay | Admitting: Physician Assistant

## 2012-06-02 ENCOUNTER — Telehealth: Payer: Self-pay | Admitting: Cardiology

## 2012-06-02 ENCOUNTER — Telehealth: Payer: Self-pay | Admitting: Internal Medicine

## 2012-06-02 DIAGNOSIS — I4891 Unspecified atrial fibrillation: Secondary | ICD-10-CM | POA: Diagnosis present

## 2012-06-02 DIAGNOSIS — Z79899 Other long term (current) drug therapy: Secondary | ICD-10-CM

## 2012-06-02 DIAGNOSIS — I5033 Acute on chronic diastolic (congestive) heart failure: Secondary | ICD-10-CM

## 2012-06-02 DIAGNOSIS — E876 Hypokalemia: Secondary | ICD-10-CM | POA: Diagnosis present

## 2012-06-02 DIAGNOSIS — M1990 Unspecified osteoarthritis, unspecified site: Secondary | ICD-10-CM | POA: Diagnosis present

## 2012-06-02 DIAGNOSIS — I509 Heart failure, unspecified: Secondary | ICD-10-CM | POA: Diagnosis present

## 2012-06-02 DIAGNOSIS — N289 Disorder of kidney and ureter, unspecified: Secondary | ICD-10-CM | POA: Diagnosis not present

## 2012-06-02 DIAGNOSIS — I10 Essential (primary) hypertension: Secondary | ICD-10-CM

## 2012-06-02 DIAGNOSIS — Z6841 Body Mass Index (BMI) 40.0 and over, adult: Secondary | ICD-10-CM

## 2012-06-02 DIAGNOSIS — Z7901 Long term (current) use of anticoagulants: Secondary | ICD-10-CM

## 2012-06-02 DIAGNOSIS — R7309 Other abnormal glucose: Secondary | ICD-10-CM | POA: Diagnosis present

## 2012-06-02 DIAGNOSIS — I872 Venous insufficiency (chronic) (peripheral): Secondary | ICD-10-CM | POA: Diagnosis present

## 2012-06-02 DIAGNOSIS — M109 Gout, unspecified: Secondary | ICD-10-CM | POA: Diagnosis not present

## 2012-06-02 DIAGNOSIS — L98499 Non-pressure chronic ulcer of skin of other sites with unspecified severity: Secondary | ICD-10-CM | POA: Diagnosis present

## 2012-06-02 HISTORY — DX: Chronic diastolic (congestive) heart failure: I50.32

## 2012-06-02 HISTORY — DX: Morbid (severe) obesity due to excess calories: E66.01

## 2012-06-02 HISTORY — DX: Hyperglycemia, unspecified: R73.9

## 2012-06-02 HISTORY — DX: Gout, unspecified: M10.9

## 2012-06-02 LAB — DIFFERENTIAL
Lymphocytes Relative: 9 % — ABNORMAL LOW (ref 12–46)
Lymphs Abs: 0.6 10*3/uL — ABNORMAL LOW (ref 0.7–4.0)
Monocytes Relative: 12 % (ref 3–12)
Neutro Abs: 5.3 10*3/uL (ref 1.7–7.7)
Neutrophils Relative %: 75 % (ref 43–77)

## 2012-06-02 LAB — URINALYSIS, ROUTINE W REFLEX MICROSCOPIC
Glucose, UA: NEGATIVE mg/dL
Hgb urine dipstick: NEGATIVE
Ketones, ur: NEGATIVE mg/dL
Protein, ur: NEGATIVE mg/dL

## 2012-06-02 LAB — APTT: aPTT: 33 seconds (ref 24–37)

## 2012-06-02 LAB — CBC
Hemoglobin: 13.3 g/dL (ref 13.0–17.0)
RBC: 4.46 MIL/uL (ref 4.22–5.81)
WBC: 7 10*3/uL (ref 4.0–10.5)

## 2012-06-02 LAB — PROTIME-INR: INR: 1.17 (ref 0.00–1.49)

## 2012-06-02 MED ORDER — NITROGLYCERIN 0.4 MG SL SUBL: 0.4000 mg | SUBLINGUAL_TABLET | SUBLINGUAL | Status: DC | PRN

## 2012-06-02 MED ORDER — ENOXAPARIN SODIUM 150 MG/ML ~~LOC~~ SOLN
150.0000 mg | Freq: Two times a day (BID) | SUBCUTANEOUS | Status: DC
  Filled 2012-06-02 (×2): qty 1

## 2012-06-02 MED ORDER — LISINOPRIL 20 MG PO TABS
20.0000 mg | ORAL_TABLET | Freq: Two times a day (BID) | ORAL | Status: DC
  Administered 2012-06-02 – 2012-06-11 (×18): 20 mg via ORAL
  Filled 2012-06-02 (×20): qty 1

## 2012-06-02 MED ORDER — CARVEDILOL 25 MG PO TABS
25.0000 mg | ORAL_TABLET | Freq: Two times a day (BID) | ORAL | Status: DC
  Administered 2012-06-02 – 2012-06-11 (×18): 25 mg via ORAL
  Filled 2012-06-02 (×20): qty 1

## 2012-06-02 MED ORDER — ENOXAPARIN SODIUM 150 MG/ML ~~LOC~~ SOLN
150.0000 mg | SUBCUTANEOUS | Status: AC
  Administered 2012-06-02: 150 mg via SUBCUTANEOUS
  Filled 2012-06-02: qty 1

## 2012-06-02 MED ORDER — SODIUM CHLORIDE 0.9 % IV SOLN: 250.0000 mL | INTRAVENOUS | Status: DC | PRN

## 2012-06-02 MED ORDER — WARFARIN - PHARMACIST DOSING INPATIENT
Freq: Every day | Status: DC
  Administered 2012-06-04 – 2012-06-09 (×3)

## 2012-06-02 MED ORDER — POTASSIUM CHLORIDE CRYS ER 20 MEQ PO TBCR
40.0000 meq | EXTENDED_RELEASE_TABLET | Freq: Two times a day (BID) | ORAL | Status: DC
  Administered 2012-06-02 – 2012-06-03 (×3): 40 meq via ORAL
  Filled 2012-06-02: qty 4
  Filled 2012-06-02 (×4): qty 2

## 2012-06-02 MED ORDER — WARFARIN SODIUM 7.5 MG PO TABS
7.5000 mg | ORAL_TABLET | Freq: Once | ORAL | Status: DC
  Filled 2012-06-02: qty 1

## 2012-06-02 MED ORDER — ZOLPIDEM TARTRATE 5 MG PO TABS: 5.0000 mg | ORAL_TABLET | Freq: Every evening | ORAL | Status: DC | PRN

## 2012-06-02 MED ORDER — SODIUM CHLORIDE 0.9 % IJ SOLN
3.0000 mL | Freq: Two times a day (BID) | INTRAMUSCULAR | Status: DC
  Administered 2012-06-02 – 2012-06-10 (×13): 3 mL via INTRAVENOUS
  Administered 2012-06-10: 10:00:00 via INTRAVENOUS
  Administered 2012-06-11: 3 mL via INTRAVENOUS

## 2012-06-02 MED ORDER — ACETAMINOPHEN 325 MG PO TABS: 650.0000 mg | ORAL_TABLET | ORAL | Status: DC | PRN

## 2012-06-02 MED ORDER — FUROSEMIDE 10 MG/ML IJ SOLN
80.0000 mg | Freq: Two times a day (BID) | INTRAMUSCULAR | Status: DC
  Administered 2012-06-02 – 2012-06-04 (×5): 80 mg via INTRAVENOUS
  Filled 2012-06-02 (×7): qty 8

## 2012-06-02 MED ORDER — WARFARIN SODIUM 10 MG PO TABS
10.0000 mg | ORAL_TABLET | Freq: Once | ORAL | Status: DC
  Filled 2012-06-02: qty 1

## 2012-06-02 MED ORDER — POTASSIUM CHLORIDE CRYS ER 20 MEQ PO TBCR
40.0000 meq | EXTENDED_RELEASE_TABLET | Freq: Once | ORAL | Status: AC
  Administered 2012-06-02: 40 meq via ORAL

## 2012-06-02 MED ORDER — SODIUM CHLORIDE 0.9 % IJ SOLN: 3.0000 mL | INTRAMUSCULAR | Status: DC | PRN

## 2012-06-02 MED ORDER — ALPRAZOLAM 0.25 MG PO TABS: 0.2500 mg | ORAL_TABLET | Freq: Two times a day (BID) | ORAL | Status: DC | PRN

## 2012-06-02 MED ORDER — ONDANSETRON HCL 4 MG/2ML IJ SOLN: 4.0000 mg | Freq: Four times a day (QID) | INTRAMUSCULAR | Status: DC | PRN

## 2012-06-02 MED ORDER — WARFARIN SODIUM 5 MG PO TABS
5.0000 mg | ORAL_TABLET | Freq: Once | ORAL | Status: AC
  Administered 2012-06-02: 5 mg via ORAL
  Filled 2012-06-02: qty 1

## 2012-06-02 MED ORDER — ENOXAPARIN SODIUM 150 MG/ML ~~LOC~~ SOLN
150.0000 mg | Freq: Once | SUBCUTANEOUS | Status: DC
  Filled 2012-06-02: qty 1

## 2012-06-02 NOTE — Telephone Encounter (Signed)
Per pt call he wants to talk to Jesus [name] because he spoke with her regarding this on Friday and he wants to talk to her right away regarding swelling in his extreme scrotum

## 2012-06-02 NOTE — H&P (Signed)
History and Physical  Patient ID: Marqueze Evangelist Patient ID: Jaser Fullen MRN: [id number], DOB/AGE: 02-12-1946 66 y.o. Date of Encounter: 06/02/2012  Primary Physician: Kaleen Mask, MD Primary Cardiologist: DM  Chief Complaint:  Swelling  HPI: Mr. Luecke is a 66 year old male with a history of heart failure. He was seen by Dr. Shirlee Latch and had some edema so his Lasix and potassium were increased. The patient feels that he is compliant with his medications and denies any recent salt load. However, he feels that he has put on a lot of fluid. He does not weigh himself daily but states that his abdominal girth is larger and he has significant scrotal edema. Scrotal edema worsened over the last 48 hours and he called the office. Admission was arranged for him as an outpatient and he is here for diuresis. He denies a significant change in his chronic dyspnea on exertion, and has not had PND or orthopnea.  Past Medical History  Diagnosis Date  . Hypertension   . Obese   . H/O: GI bleed   . Venous stasis ulcer   . Atrial fibrillation   . Edema of lower extremity   . CHF (congestive heart failure)      Surgical History: No pertinent past surgical history.   I have reviewed the patient's current medications. Medication Sig  carvedilol (COREG) 25 MG tablet Take 25 mg by mouth 2 (two) times daily with a meal.  furosemide (LASIX) 40 MG tablet Take 40 mg by mouth 2 (two) times daily.  lisinopril (PRINIVIL,ZESTRIL) 40 MG tablet Take 20 mg by mouth 2 (two) times daily. Either takes 1/2 tablet (20mg ) twice a day or 1 tablet (40mg ) once a day  metoprolol (LOPRESSOR) 100 MG tablet Take 100 mg by mouth 2 (two) times daily.  potassium chloride SA (K-DUR,KLOR-CON) 20 MEQ tablet Take 40 mEq by mouth 2 (two) times daily.  warfarin (COUMADIN) 5 MG tablet Take 5 mg by mouth daily.   Allergies:  Allergies  Allergen Reactions  . Amlodipine Besylate Swelling   History   Social History    . Marital Status: Divorced    Spouse Name: N/A    Number of Children: N/A  . Years of Education: N/A   Occupational History  . Retired    Social History Main Topics  . Smoking status: Never Smoker   . Smokeless tobacco: Not on file  . Alcohol Use: Not on file  . Drug Use: No  . Sexually Active:    Other Topics Concern  . Not on file   Social History Narrative   Patient lives alone.   Family History  Problem Relation Age of Onset  . Arrhythmia Father     Review of Systems:  he has chronic lower extremity edema. His legs were wrapped 3 days ago and be wrapping is to be changed once a week. He has not had recent illnesses fevers or chills. He does not cough or wheeze. He has not had any GI issues. He has had some difficulty urinating today because of the scrotal and penile swelling but otherwise no dysuria. He denies any bleeding issues.   Full 14-point review of systems otherwise negative except as noted above.   Physical Exam: General: Well developed, well nourished, male in no acute distress. Head: Normocephalic, atraumatic, sclera non-icteric, no xanthomas, nares are without discharge. Dentition:  for  Neck:  No carotid bruits. JVD not elevated. No thyromegally Lungs: Good expansion bilaterally. without wheezes or rhonchi. Some Rales but  no crackles  Heart: IRRegular rate and rhythm with S1 S2.  No S3 or S4.  No murmur, no rubs, or gallops appreciated. Abdomen: Soft, non-tender, distended with normoactive bowel sounds. No hepatomegaly. No rebound/guarding. No obvious abdominal masses, obvious scrotal swelling Msk:  Strength and tone appear normal for age. No joint deformities or effusions, no spine or costo-vertebral angle tenderness. Extremities: No clubbing or cyanosis. bilateral lower extremity  Edema, not further assessed because of compression wraps.  Distal pedal pulses are 2+ in upper extrem, not palpable and lower extremities because of the compression wraps, capillary  refill approximately 5 seconds  Neuro: Alert and oriented X 3. Moves all extremities spontaneously. No focal deficits noted. Psych:  Responds to questions appropriately with a normal affect. Skin: No rashes or lesions noted  Labs:   [facility] 06/01/12 1208  INR 1.23    Lab 06/01/12 1208  NA 131*  K 2.9*  CL 88*  CO2 32  BUN 7  CREATININE 0.62  CALCIUM 9.5  PROT --  BILITOT --  ALKPHOS --  ALT --  AST --  GLUCOSE 127*    Lab Results  Component Value Date   CHOL 164 05/07/2012   HDL 30.40* 05/07/2012   LDLCALC 116* 05/07/2012   TRIG 87.0 05/07/2012   Pro B Natriuretic peptide (BNP)  Date/Time Value Range Status  05/24/2012  3:08 PM 390.0* 0.0 - 100.0 pg/mL Final  05/12/2012 10:48 AM 382.0* 0.0 - 100.0 pg/mL Final   Radiology/Studies:  No results found.   Echo: 05/07/2012  Study Conclusions - Left ventricle: The cavity size was normal. Wall thickness was increased in a pattern of severe LVH. Systolic function was normal. The estimated ejection fraction was in the range of 50% to 55%. Wall motion was normal; there were no regional wall motion abnormalities. - Mitral valve: Calcified annulus. - Left atrium: The atrium was moderately dilated. - Right ventricle: The cavity size was mildly dilated. - Right atrium: The atrium was mildly dilated. Impressions: - Technically difficult; severe LVH noted and biatrial enlargement; consider infiltrative cardiomyopathy such as amyloid.  ECG: pending  ASSESSMENT AND PLAN:  Principal Problem:  *Acute on chronic diastolic CHF (congestive heart failure) - admit, diuresis with IV Lasix, monitor electrolytes closely and supplement potassium. Encourage him to obtain scales and consider HHRN to help with compliance.   Otherwise, continue home Rx and follow. Will use full strength Lovenox if INR sub-therapeutic. Give 120 meq K+ today and follow labs. Active Problems:  Atrial fibrillation Anticoagulation with coumadin   Hypertension Hypokalemia  Signed,  Theodore Demark [name] 06/02/2012, 11:33 AM  Cardiology Attending  Patient seen and examined. Agree with above. Lewayne Bunting, M.D.

## 2012-06-02 NOTE — Telephone Encounter (Signed)
Spoke with pt. Pt to be admitted to [facility] today.

## 2012-06-02 NOTE — Telephone Encounter (Signed)
Spoke with pt. Pt states he has been to ED and has also seen a urologist. Pt states he was given IV lasix in the hospital and sent home. Pt states the swelling in his scrotum is worse and not better. The swelling in his feet and legs is about the same as it has been.  I will review with Dr Shirlee Latch.

## 2012-06-02 NOTE — Telephone Encounter (Signed)
Patient contact me tonight (1205am) to discuss testicular swelling.  He was seen by ER today and received IV lasix and was discharged to home.  He reports that he is "frustrated," but his symptoms do not sound like they have changed.  I have recommended that he be evaluated by a physician in the morning to see if his lasix needs to be increased or if her needs outpatient IV lasix.  See ER note and phone notes from Dr. Shirlee Latch for further details.   Gwendalyn Ege

## 2012-06-02 NOTE — Telephone Encounter (Signed)
Reviewed with Dr Shirlee Latch. Per Dr Shirlee Latch will admit to hospital. Spoke with pt and he is aware bed is ready at [facility]. Lavella Hammock has been notified pt to be admitted to hospital.

## 2012-06-02 NOTE — Progress Notes (Signed)
Utilization review completed.  

## 2012-06-02 NOTE — Progress Notes (Addendum)
ANTICOAGULATION CONSULT NOTE - Initial Consult  Pharmacy Consult for lovenox/coumadin Indication: atrial fibrillation  Allergies  Allergen Reactions  . Amlodipine Besylate Swelling   Labs:  [facility] 06/01/12 1208  HGB --  HCT --  PLT --  APTT --  LABPROT 15.8*  INR 1.23  HEPARINUNFRC --  CREATININE 0.62  CKTOTAL --  CKMB --  TROPONINI --    The CrCl is unknown because both a height and weight (above a minimum accepted value) are required for this calculation.   Medical History: Past Medical History  Diagnosis Date  . Hypertension   . Obese   . H/O: GI bleed   . Venous stasis ulcer   . Atrial fibrillation   . Edema of lower extremity   . CHF (congestive heart failure)     Medications:  Prescriptions prior to admission  Medication Sig Dispense Refill  . carvedilol (COREG) 25 MG tablet Take 25 mg by mouth 2 (two) times daily with a meal.      . furosemide (LASIX) 40 MG tablet Take 40 mg by mouth 2 (two) times daily.      Marland Kitchen lisinopril (PRINIVIL,ZESTRIL) 40 MG tablet Take 20 mg by mouth 2 (two) times daily. Either takes 1/2 tablet (20mg ) twice a day or 1 tablet (40mg ) once a day      . metoprolol (LOPRESSOR) 100 MG tablet Take 100 mg by mouth 2 (two) times daily.      . potassium chloride SA (K-DUR,KLOR-CON) 20 MEQ tablet Take 40 mEq by mouth 2 (two) times daily.      Marland Kitchen warfarin (COUMADIN) 5 MG tablet Take 5 mg by mouth daily.        Assessment: 66 yo who was admitted for CHF. He has been on coumadin for afib. His admission INR is 1.23. Lovenox has been ordered for bridging while his INR is subtherapeutic.   Goal of Therapy:  INR 2-3 Monitor platelets by anticoagulation protocol: Yes   Plan:  Coumadin 10mg  x1 Daily PT/INR Lovenox 150mg  SQ bid CBC q3days  Jesus [name] 06/02/2012,12:25 PM  Wt documented incorrect. Pt received one dose of lovenox 150mg  which comes out to be 1.5mg /kg/dose. Change to prophylaxis this AM

## 2012-06-03 ENCOUNTER — Encounter (HOSPITAL_COMMUNITY): Payer: Self-pay | Admitting: General Practice

## 2012-06-03 DIAGNOSIS — I5033 Acute on chronic diastolic (congestive) heart failure: Principal | ICD-10-CM

## 2012-06-03 DIAGNOSIS — I509 Heart failure, unspecified: Secondary | ICD-10-CM

## 2012-06-03 DIAGNOSIS — I4891 Unspecified atrial fibrillation: Secondary | ICD-10-CM

## 2012-06-03 LAB — BASIC METABOLIC PANEL
CO2: 36 mEq/L — ABNORMAL HIGH (ref 19–32)
CO2: 33 mEq/L — ABNORMAL HIGH (ref 19–32)
Calcium: 9.1 mg/dL (ref 8.4–10.5)
Chloride: 91 mEq/L — ABNORMAL LOW (ref 96–112)
Chloride: 92 mEq/L — ABNORMAL LOW (ref 96–112)
Chloride: 90 mEq/L — ABNORMAL LOW (ref 96–112)
GFR calc Af Amer: >90 mL/min (ref >90)
GFR calc non Af Amer: >90 mL/min (ref >90)
Potassium: 3.1 mEq/L — ABNORMAL LOW (ref 3.5–5.1)
Potassium: 3.3 mEq/L — ABNORMAL LOW (ref 3.5–5.1)
Sodium: 138 mEq/L (ref 135–145)
Sodium: 135 mEq/L (ref 135–145)

## 2012-06-03 LAB — PROTIME-INR: Prothrombin Time: 15.4 seconds — ABNORMAL HIGH (ref 11.6–15.2)

## 2012-06-03 LAB — HEMOGLOBIN A1C
Hgb A1c MFr Bld: 6.8 % — ABNORMAL HIGH (ref <5.7)
Mean Plasma Glucose: 148 mg/dL — ABNORMAL HIGH (ref <117)

## 2012-06-03 LAB — CBC
MCH: 29.9 pg (ref 26.0–34.0)
Platelets: 231 10*3/uL (ref 150–400)
RBC: 4.32 MIL/uL (ref 4.22–5.81)
RDW: 13.7 % (ref 11.5–15.5)
WBC: 6.5 10*3/uL (ref 4.0–10.5)

## 2012-06-03 LAB — MAGNESIUM: Magnesium: 1.4 mg/dL — ABNORMAL LOW (ref 1.5–2.5)

## 2012-06-03 MED ORDER — WARFARIN SODIUM 10 MG PO TABS
10.0000 mg | ORAL_TABLET | Freq: Once | ORAL | Status: AC
  Administered 2012-06-03: 10 mg via ORAL
  Filled 2012-06-03: qty 1

## 2012-06-03 MED ORDER — POTASSIUM CHLORIDE CRYS ER 20 MEQ PO TBCR
EXTENDED_RELEASE_TABLET | ORAL | Status: AC
  Administered 2012-06-03: 20 meq
  Filled 2012-06-03: qty 2

## 2012-06-03 MED ORDER — ENOXAPARIN SODIUM 40 MG/0.4ML ~~LOC~~ SOLN
40.0000 mg | SUBCUTANEOUS | Status: DC
Start: 2012-06-03 — End: 2012-06-11
  Administered 2012-06-03 – 2012-06-10 (×8): 40 mg via SUBCUTANEOUS
  Filled 2012-06-03 (×9): qty 0.4

## 2012-06-03 MED ORDER — SPIRONOLACTONE 25 MG PO TABS
25.0000 mg | ORAL_TABLET | Freq: Every day | ORAL | Status: DC
  Administered 2012-06-03 – 2012-06-11 (×9): 25 mg via ORAL
  Filled 2012-06-03 (×10): qty 1

## 2012-06-03 MED ORDER — WARFARIN SODIUM 7.5 MG PO TABS
7.5000 mg | ORAL_TABLET | Freq: Once | ORAL | Status: DC
  Filled 2012-06-03: qty 1

## 2012-06-03 MED ORDER — POTASSIUM CHLORIDE CRYS ER 20 MEQ PO TBCR
40.0000 meq | EXTENDED_RELEASE_TABLET | Freq: Three times a day (TID) | ORAL | Status: DC
  Administered 2012-06-03 – 2012-06-10 (×22): 40 meq via ORAL
  Filled 2012-06-03 (×27): qty 2

## 2012-06-03 MED ORDER — MAGNESIUM SULFATE 40 MG/ML IJ SOLN
2.0000 g | Freq: Once | INTRAMUSCULAR | Status: AC
  Administered 2012-06-03: 2 g via INTRAVENOUS
  Filled 2012-06-03: qty 50

## 2012-06-03 MED ORDER — POTASSIUM CHLORIDE CRYS ER 20 MEQ PO TBCR
40.0000 meq | EXTENDED_RELEASE_TABLET | ORAL | Status: AC
  Administered 2012-06-03 (×2): 40 meq via ORAL

## 2012-06-03 MED ORDER — ENOXAPARIN SODIUM 100 MG/ML ~~LOC~~ SOLN
100.0000 mg | Freq: Two times a day (BID) | SUBCUTANEOUS | Status: DC
  Filled 2012-06-03 (×2): qty 1

## 2012-06-03 MED ORDER — POTASSIUM CHLORIDE CRYS ER 20 MEQ PO TBCR
40.0000 meq | EXTENDED_RELEASE_TABLET | Freq: Once | ORAL | Status: AC
  Administered 2012-06-03: 40 meq via ORAL

## 2012-06-03 MED ORDER — POTASSIUM CHLORIDE 20 MEQ PO PACK: 40.0000 meq | PACK | Freq: Once | ORAL | Status: DC

## 2012-06-03 NOTE — Progress Notes (Signed)
Patient ID: Jesus [name], male   DOB: Apr 11, 1946, 66 y.o.   MRN: [id number]    SUBJECTIVE: Patient diuresed well yesterday.  Still very swollen.      . carvedilol  25 mg Oral BID WC  . enoxaparin (LOVENOX) injection  150 mg Subcutaneous NOW  . enoxaparin (LOVENOX) injection  40 mg Subcutaneous Q24H  . furosemide  80 mg Intravenous BID  . lisinopril  20 mg Oral BID  . potassium chloride  40 mEq Oral Once  . potassium chloride SA  40 mEq Oral BID  . potassium chloride  40 mEq Oral Once  . sodium chloride  3 mL Intravenous Q12H  . spironolactone  25 mg Oral Daily  . warfarin  5 mg Oral ONCE-1800  . Warfarin - Pharmacist Dosing Inpatient   Does not apply q1800  . DISCONTD: enoxaparin (LOVENOX) injection  100 mg Subcutaneous Q12H  . DISCONTD: enoxaparin (LOVENOX) injection  150 mg Subcutaneous Once  . DISCONTD: enoxaparin (LOVENOX) injection  150 mg Subcutaneous Q12H  . DISCONTD: warfarin  10 mg Oral ONCE-1800  . DISCONTD: warfarin  7.5 mg Oral ONCE-1800      Filed Vitals:   06/02/12 1642 06/02/12 2138 06/03/12 0500 06/03/12 0600  BP: 161/84 155/91 184/95 164/89  Pulse: 88 65 71   Temp:  98.2 F (36.8 C) 98.7 F (37.1 C)   TempSrc:  Oral Oral   Resp:  20 20   Height:      Weight:   107.4 kg (236 lb 12.4 oz)   SpO2:  94% 94%     Intake/Output Summary (Last 24 hours) at 06/03/12 0821 Last data filed at 06/03/12 0556  Gross per 24 hour  Intake    240 ml  Output   2725 ml  Net  -2485 ml    LABS: Basic Metabolic Panel:  Basename 06/03/12 0500 06/02/12 1315 06/01/12 1208  NA 137 -- 131*  K 3.1* -- 2.9*  CL 91* -- 88*  CO2 33* -- 32  GLUCOSE 116* -- 127*  BUN 10 -- 7  CREATININE 0.72 -- 0.62  CALCIUM 9.4 -- 9.5  MG -- 1.5 --  PHOS -- -- --   Liver Function Tests: No results found for this basename: AST:2,ALT:2,ALKPHOS:2,BILITOT:2,PROT:2,ALBUMIN:2 in the last 72 hours No results found for this basename: LIPASE:2,AMYLASE:2 in the last 72 hours CBC:  Basename  06/03/12 0500 06/02/12 1315  WBC 6.5 7.0  NEUTROABS -- 5.3  HGB 12.9* 13.3  HCT 38.7* 39.6  MCV 89.6 88.8  PLT 231 215   Cardiac Enzymes: No results found for this basename: CKTOTAL:3,CKMB:3,CKMBINDEX:3,TROPONINI:3 in the last 72 hours BNP: No components found with this basename: POCBNP:3 D-Dimer: No results found for this basename: DDIMER:2 in the last 72 hours Hemoglobin A1C:  Basename 06/02/12 1315  HGBA1C 6.8*   Fasting Lipid Panel: No results found for this basename: CHOL,HDL,LDLCALC,TRIG,CHOLHDL,LDLDIRECT in the last 72 hours Thyroid Function Tests: No results found for this basename: TSH,T4TOTAL,FREET3,T3FREE,THYROIDAB in the last 72 hours Anemia Panel: No results found for this basename: VITAMINB12,FOLATE,FERRITIN,TIBC,IRON,RETICCTPCT in the last 72 hours  RADIOLOGY: No results found.  PHYSICAL EXAM General: NAD Neck: JVP 12 cm, no thyromegaly or thyroid nodule.  Lungs: Clear to auscultation bilaterally with normal respiratory effort. CV: Nondisplaced PMI.  Heart irregular S1/S2, no S3/S4, no murmur.  2+ chronic edema to thighs.  Scrotal edema.  No carotid bruit.  Abdomen: Soft, nontender, no hepatosplenomegaly, no distention.  Neurologic: Alert and oriented x 3.  Psych: Normal affect. Extremities:  No clubbing or cyanosis.   TELEMETRY: Reviewed telemetry pt in atrial fibrillation  ASSESSMENT AND PLAN:  66 yo with history of chronic atrial fibrillation and diastolic CHF was admitted with volume overload.  1. Atrial fibrillation: Continue coumadin, don't have to overlap with therapeutic Lovenox.  Good rate control with Coreg.  2. Acute on chronic diastolic CHF: Needs more diuresis. Continue IV Lasix (good I/Os).  Replete K.  3. HTN: Uncontrolled.  Add spironolactone 25 daily.  Cannot take norvasc (worsens edema).  Next step probably clonidine.   Jesus [name] 06/03/2012 8:24 AM

## 2012-06-03 NOTE — Care Management Note (Unsigned)
    Page 1 of 2   06/09/2012     4:13:28 PM   CARE MANAGEMENT NOTE 06/09/2012  Patient:  Jesus [name], Jesus [name]   Account Number:  [id number]  Date Initiated:  06/02/2012  Documentation initiated by:  Donn Pierini  Subjective/Objective Assessment:   Pt admitted with CHF     Action/Plan:   PTA pt lived at home   Anticipated DC Date:  06/05/2012   Anticipated DC Plan:  HOME W HOME HEALTH SERVICES      DC Planning Services  CM consult      Choice offered to / List presented to:             Status of service:  In process, will continue to follow Medicare Important Message given?   (If response is "NO", the following Medicare IM given date fields will be blank) Date Medicare IM given:   Date Additional Medicare IM given:    Discharge Disposition:    Per UR Regulation:  Reviewed for med. necessity/level of care/duration of stay  If discussed at Long Length of Stay Meetings, dates discussed:   06/09/2012    Comments:  PCP- Windle Guard OLIVER  06-09-12 [address] RN,BSN [phone] CSW did offer choice to pt and he was not ready to choose [facility] at this time. CM will continue to monitor for disposition needs.  06-09-12 Pt was discussed in Long Length of stay meeting today.  06-07-12 1440 Tomi Bamberger, RN, BSN [phone] Pt will benefit from PT/OT consult. CM will continue to f/u for disposition needs.   06/03/12- 1530- Donn Pierini RN, BSN [phone] Spoke with pt at bedside regarding potential d/c needs. Per conversation pt lives alone, has sister [name]) and close friend [name])  to assist. Pt uses cane at times. Pt states that he goes to Dr. Jeannetta Nap for is yearly physical, but is also followed by Dr. Waldron Labs for his compression wraps on bil. LE. Pt uses local pharm. for medications ([facility]) and has medication coverage. Spoke to pt about possible HH-RN to follow him at discharge- pt states that he does not feel like he will need HH services-  and declines at this time. He does say however that if something changes he will be open to discussion. NCM to continue to follow.  06/02/12- 1345- Donn Pierini RN, BSN [phone] UR completed,

## 2012-06-03 NOTE — Consult Note (Signed)
WOC consult Note Reason for Consult: Consult requested for bilat leg wraps.  Pt states he is followed by [place] [facility] for compression wraps Q week.  These were applied Monday and are not due to be changed until next Monday.  He denies any wounds under wraps and requests that wraps be left in place until he can resume follow-up with [place] [facility]. Please re-consult if patient still in Hospital after Monday and assistance is needed with changing wraps. Will not plan to follow further unless re-consulted.  [address] RN, MSN, [employer]  [phone]

## 2012-06-03 NOTE — Clinical Documentation Improvement (Signed)
BMI DOCUMENTATION CLARIFICATION QUERY  THIS DOCUMENT IS NOT A PERMANENT PART OF THE MEDICAL RECORD  TO RESPOND TO THE THIS QUERY, FOLLOW THE INSTRUCTIONS BELOW:  1. If needed, update documentation for the patient's encounter via the notes activity.  2. Access this query again and click edit on the In [employer].  3. After updating, or not, click F2 to complete all highlighted (required) fields concerning your review. Select "additional documentation in the medical record" OR "no additional documentation provided".  4. Click Sign note button.  5. The deficiency will fall out of your In Basket *Please let us know if you are not able to complete this workflow by phone or e-mail (listed below).         06/03/12  Dear Dr. Shirlee Latch Marton Redwood  In an effort to better capture your patient's severity of illness, reflect appropriate length of stay and utilization of resources, a review of the patient medical record has revealed the following indicators.    Based on your clinical judgment, please clarify and document in a progress note and/or discharge summary the clinical condition associated with the following supporting information:  In responding to this query please exercise your independent judgment.  The fact that a query is asked, does not imply that any particular answer is desired or expected.   Possible Clinical conditions:  Morbid Obesity W/ BMI= 49.7  Other condition  Cannot Clinically determine    Risk Factors: Noted obese per 6/26 progress notes.  Sign & Symptoms: Weight: 336lbs  Height:  5'9" BMI=    49.7   Reviewed: No additional documentation provided.                               Thank You,  Marciano Sequin,  Clinical Documentation Specialist:  Pager: [phone]  Health Information Management [place]

## 2012-06-03 NOTE — Progress Notes (Addendum)
ANTICOAGULATION CONSULT NOTE - Initial Consult  Pharmacy Consult for coumadin Indication: atrial fibrillation  Allergies  Allergen Reactions  . Amlodipine Besylate Swelling   Labs:  Basename 06/03/12 0500 06/02/12 1315 06/01/12 1208  HGB 12.9* 13.3 --  HCT 38.7* 39.6 --  PLT 231 215 --  APTT -- 33 --  LABPROT 15.4* 15.1 15.8*  INR 1.19 1.17 1.23  HEPARINUNFRC -- -- --  CREATININE 0.72 -- 0.62  CKTOTAL -- -- --  CKMB -- -- --  TROPONINI -- -- --    Estimated Creatinine Clearance: 109.7 ml/min (by C-G formula based on Cr of 0.72).   Medical History: Past Medical History  Diagnosis Date  . Hypertension   . Obese   . H/O: GI bleed   . Venous stasis ulcer   . Atrial fibrillation   . Edema of lower extremity   . CHF (congestive heart failure)     Medications:  Prescriptions prior to admission  Medication Sig Dispense Refill  . carvedilol (COREG) 25 MG tablet Take 25 mg by mouth 2 (two) times daily with a meal.      . furosemide (LASIX) 40 MG tablet Take 40 mg by mouth 2 (two) times daily.      Marland Kitchen lisinopril (PRINIVIL,ZESTRIL) 40 MG tablet Take 20 mg by mouth 2 (two) times daily. Either takes 1/2 tablet (20mg ) twice a day or 1 tablet (40mg ) once a day      . metoprolol (LOPRESSOR) 100 MG tablet Take 100 mg by mouth 2 (two) times daily.      . potassium chloride SA (K-DUR,KLOR-CON) 20 MEQ tablet Take 40 mEq by mouth 2 (two) times daily.      Marland Kitchen warfarin (COUMADIN) 5 MG tablet Take 5 mg by mouth daily.        Assessment: 66 yo who was admitted for CHF. He has been on coumadin for afib. His admission INR is 1.19. Lovenox has been changed to prophylaxis dose since his afib is stable.   Goal of Therapy:  INR 2-3 Monitor platelets by anticoagulation protocol: Yes   Plan:  Coumadin 10mg  PO x1 Daily PT/INR Lovenox 40mg  qday

## 2012-06-04 LAB — PROTIME-INR: INR: 1.26 (ref 0.00–1.49)

## 2012-06-04 LAB — BASIC METABOLIC PANEL
CO2: 34 mEq/L — ABNORMAL HIGH (ref 19–32)
GFR calc Af Amer: >90 mL/min (ref >90)
GFR calc non Af Amer: >90 mL/min (ref >90)
GFR calc non Af Amer: >90 mL/min (ref >90)
Glucose, Bld: 113 mg/dL — ABNORMAL HIGH (ref 70–99)
Glucose, Bld: 93 mg/dL (ref 70–99)
Potassium: 3.6 mEq/L (ref 3.5–5.1)
Potassium: 3.1 mEq/L — ABNORMAL LOW (ref 3.5–5.1)
Sodium: 136 mEq/L (ref 135–145)
Sodium: 135 mEq/L (ref 135–145)

## 2012-06-04 LAB — CBC
HCT: 39.6 % (ref 39.0–52.0)
RDW: 13.8 % (ref 11.5–15.5)
WBC: 6.5 10*3/uL (ref 4.0–10.5)

## 2012-06-04 MED ORDER — MAGNESIUM OXIDE 400 (241.3 MG) MG PO TABS
400.0000 mg | ORAL_TABLET | Freq: Every day | ORAL | Status: DC
  Administered 2012-06-04 – 2012-06-11 (×8): 400 mg via ORAL
  Filled 2012-06-04 (×8): qty 1

## 2012-06-04 MED ORDER — MAGNESIUM SULFATE IN D5W 10-5 MG/ML-% IV SOLN
1.0000 g | Freq: Once | INTRAVENOUS | Status: AC
  Administered 2012-06-04: 1 g via INTRAVENOUS
  Filled 2012-06-04: qty 100

## 2012-06-04 MED ORDER — MAGNESIUM SULFATE 50 % IJ SOLN: 1.0000 g | Freq: Once | INTRAMUSCULAR | Status: DC

## 2012-06-04 MED ORDER — CLONIDINE HCL 0.1 MG PO TABS
0.1000 mg | ORAL_TABLET | Freq: Two times a day (BID) | ORAL | Status: DC
  Administered 2012-06-04 – 2012-06-06 (×6): 0.1 mg via ORAL
  Filled 2012-06-04 (×8): qty 1

## 2012-06-04 MED ORDER — FUROSEMIDE 10 MG/ML IJ SOLN
80.0000 mg | Freq: Three times a day (TID) | INTRAMUSCULAR | Status: DC
  Administered 2012-06-04 – 2012-06-09 (×15): 80 mg via INTRAVENOUS
  Filled 2012-06-04 (×18): qty 8

## 2012-06-04 MED ORDER — WARFARIN SODIUM 10 MG PO TABS
10.0000 mg | ORAL_TABLET | Freq: Once | ORAL | Status: AC
  Administered 2012-06-04: 10 mg via ORAL
  Filled 2012-06-04: qty 1

## 2012-06-04 NOTE — Progress Notes (Signed)
Patient ID: Jesus [name], male   DOB: Aug 05, 1946, 66 y.o.   MRN: [id number]     SUBJECTIVE: Continuing to diurese.  He is concerned because his scrotum is still very swollen.      . carvedilol  25 mg Oral BID WC  . cloNIDine  0.1 mg Oral BID  . enoxaparin (LOVENOX) injection  40 mg Subcutaneous Q24H  . furosemide  80 mg Intravenous BID  . lisinopril  20 mg Oral BID  . magnesium sulfate 1 - 4 g bolus IVPB  2 g Intravenous Once  . potassium chloride SA      . potassium chloride  40 mEq Oral Once  . potassium chloride SA  40 mEq Oral TID  . potassium chloride  40 mEq Oral Q3H  . sodium chloride  3 mL Intravenous Q12H  . spironolactone  25 mg Oral Daily  . warfarin  10 mg Oral ONCE-1800  . warfarin  10 mg Oral ONCE-1800  . Warfarin - Pharmacist Dosing Inpatient   Does not apply q1800  . DISCONTD: potassium chloride  40 mEq Oral Once  . DISCONTD: potassium chloride SA  40 mEq Oral BID  . DISCONTD: warfarin  7.5 mg Oral ONCE-1800      Filed Vitals:   06/03/12 1736 06/03/12 2049 06/03/12 2148 06/04/12 0512  BP: 170/81 167/114 170/81 166/81  Pulse: 68 73  66  Temp:  98.4 F (36.9 C)  99.1 F (37.3 C)  TempSrc:  Oral  Oral  Resp:  20  20  Height:      Weight:    144.8 kg (319 lb 3.6 oz)  SpO2:  90%  93%    Intake/Output Summary (Last 24 hours) at 06/04/12 0826 Last data filed at 06/04/12 0325  Gross per 24 hour  Intake      0 ml  Output   2550 ml  Net  -2550 ml    LABS: Basic Metabolic Panel:  Basename 06/04/12 0605 06/03/12 2143 06/03/12 1425  NA 136 135 --  K 3.6 3.3* --  CL 91* 90* --  CO2 34* 33* --  GLUCOSE 113* 185* --  BUN 10 10 --  CREATININE 0.68 0.71 --  CALCIUM 9.5 9.1 --  MG 1.7 -- 1.4*  PHOS -- -- --   Liver Function Tests: No results found for this basename: AST:2,ALT:2,ALKPHOS:2,BILITOT:2,PROT:2,ALBUMIN:2 in the last 72 hours No results found for this basename: LIPASE:2,AMYLASE:2 in the last 72 hours CBC:  Basename 06/04/12 0605  06/03/12 0500 06/02/12 1315  WBC 6.5 6.5 --  NEUTROABS -- -- 5.3  HGB 12.7* 12.9* --  HCT 39.6 38.7* --  MCV 91.7 89.6 --  PLT 218 231 --   Cardiac Enzymes: No results found for this basename: CKTOTAL:3,CKMB:3,CKMBINDEX:3,TROPONINI:3 in the last 72 hours BNP: No components found with this basename: POCBNP:3 D-Dimer: No results found for this basename: DDIMER:2 in the last 72 hours Hemoglobin A1C:  Basename 06/02/12 1315  HGBA1C 6.8*   Fasting Lipid Panel: No results found for this basename: CHOL,HDL,LDLCALC,TRIG,CHOLHDL,LDLDIRECT in the last 72 hours Thyroid Function Tests: No results found for this basename: TSH,T4TOTAL,FREET3,T3FREE,THYROIDAB in the last 72 hours Anemia Panel: No results found for this basename: VITAMINB12,FOLATE,FERRITIN,TIBC,IRON,RETICCTPCT in the last 72 hours  RADIOLOGY: No results found.  PHYSICAL EXAM General: NAD Neck: JVP 12 cm, no thyromegaly or thyroid nodule.  Lungs: Clear to auscultation bilaterally with normal respiratory effort. CV: Nondisplaced PMI.  Heart irregular S1/S2, no S3/S4, no murmur.  2+ chronic edema to thighs.  Scrotal edema.  No carotid bruit.  Abdomen: Soft, nontender, no hepatosplenomegaly, no distention.  Neurologic: Alert and oriented x 3.  Psych: Normal affect. Extremities: No clubbing or cyanosis.   TELEMETRY: Reviewed telemetry pt in atrial fibrillation  ASSESSMENT AND PLAN:  66 yo with history of chronic atrial fibrillation and diastolic CHF was admitted with volume overload.  1. Atrial fibrillation: Continue coumadin, don't have to overlap with therapeutic Lovenox.  Good rate control with Coreg.  2. Acute on chronic diastolic CHF: Needs more diuresis. Continue IV Lasix (good I/Os) but increase to q 8 hrs.  Replete K.  3. HTN: Uncontrolled.  Add clonidine today.  Cannot take amlodipine.   [name] 06/04/2012 8:26 AM

## 2012-06-04 NOTE — Progress Notes (Signed)
Nutrition Brief Note  RD pulled to patient due to Nutrition Risk Report: unintentional weight loss > 10 lbs within the past month per admission nutrition screen. Patient reports this is due to fluid loss; currently diuresing. PO intake 100% on a Heart Healthy diet. BMI = 49.7 (Obesity Class III). No nutrition intervention indicated at this time. Please consult RD as needed.  Alger Memos Pager #: [phone]

## 2012-06-04 NOTE — Progress Notes (Signed)
ANTICOAGULATION CONSULT NOTE - Follow Up Consult  Pharmacy Consult for coumadin Indication: atrial fibrillation  Allergies  Allergen Reactions  . Amlodipine Besylate Swelling    "started off low and ended up severe; if, in fact, that is what's causing the swelling" (06/03/12)    Patient Measurements: Height: 5\' 9"  (175.3 cm) Weight: 319 lb 3.6 oz (144.8 kg) IBW/kg (Calculated) : 70.7   Vital Signs: Temp: 99.1 F (37.3 C) (06/28 0512) Temp src: Oral (06/28 0512) BP: 166/81 mmHg (06/28 0512) Pulse Rate: 66  (06/28 0512)  Labs:  Basename 06/04/12 0605 06/03/12 2143 06/03/12 1425 06/03/12 0500 06/02/12 1315  HGB 12.7* -- -- 12.9* --  HCT 39.6 -- -- 38.7* 39.6  PLT 218 -- -- 231 215  APTT -- -- -- -- 33  LABPROT 16.1* -- -- 15.4* 15.1  INR 1.26 -- -- 1.19 1.17  HEPARINUNFRC -- -- -- -- --  CREATININE 0.68 0.71 0.76 -- --  CKTOTAL -- -- -- -- --  CKMB -- -- -- -- --  TROPONINI -- -- -- -- --    Estimated Creatinine Clearance: 128.9 ml/min (by C-G formula based on Cr of 0.68).   Medications:  Scheduled:    . carvedilol  25 mg Oral BID WC  . cloNIDine  0.1 mg Oral BID  . enoxaparin (LOVENOX) injection  40 mg Subcutaneous Q24H  . furosemide  80 mg Intravenous BID  . lisinopril  20 mg Oral BID  . magnesium sulfate 1 - 4 g bolus IVPB  2 g Intravenous Once  . potassium chloride SA      . potassium chloride  40 mEq Oral Once  . potassium chloride SA  40 mEq Oral TID  . potassium chloride  40 mEq Oral Q3H  . sodium chloride  3 mL Intravenous Q12H  . spironolactone  25 mg Oral Daily  . warfarin  10 mg Oral ONCE-1800  . Warfarin - Pharmacist Dosing Inpatient   Does not apply q1800  . DISCONTD: enoxaparin (LOVENOX) injection  100 mg Subcutaneous Q12H  . DISCONTD: enoxaparin (LOVENOX) injection  150 mg Subcutaneous Once  . DISCONTD: enoxaparin (LOVENOX) injection  150 mg Subcutaneous Q12H  . DISCONTD: potassium chloride  40 mEq Oral Once  . DISCONTD: potassium chloride SA   40 mEq Oral BID  . DISCONTD: warfarin  7.5 mg Oral ONCE-1800    Assessment: 66 yo who was admitted for CHF. He has been on coumadin for afib and INR was subtherapeutic on coumadin 5mg /day PTA. INR=1.26 today  Goal of Therapy:  INR 2-3 Monitor platelets by anticoagulation protocol: Yes   Plan:  -Coumadin 10mg  po x1 today -PT/INR daily  Harland German, Pharm D 06/04/2012 7:52 AM

## 2012-06-04 NOTE — Progress Notes (Signed)
Inpatient Diabetes Program Recommendations  AACE/ADA: New Consensus Statement on Inpatient Glycemic Control (2009)  Target Ranges:  Prepandial:   less than 140 mg/dL      Peak postprandial:   less than 180 mg/dL (1-2 hours)      Critically ill patients:  140 - 180 mg/dL   Reason for Visit: WNUU7O of 6.8%  Inpatient Diabetes Program Recommendations HgbA1C: HgbA1C is 6.8%   Question diagnosis of diabetes?  Consider checking CBGs AC & HS.  Note:

## 2012-06-05 DIAGNOSIS — I10 Essential (primary) hypertension: Secondary | ICD-10-CM

## 2012-06-05 LAB — BASIC METABOLIC PANEL
BUN: 12 mg/dL (ref 6–23)
Chloride: 89 mEq/L — ABNORMAL LOW (ref 96–112)
GFR calc Af Amer: >90 mL/min (ref >90)
Potassium: 3.7 mEq/L (ref 3.5–5.1)
Sodium: 134 mEq/L — ABNORMAL LOW (ref 135–145)

## 2012-06-05 LAB — PROTIME-INR
INR: 1.38 (ref 0.00–1.49)
Prothrombin Time: 17.2 seconds — ABNORMAL HIGH (ref 11.6–15.2)

## 2012-06-05 MED ORDER — DILTIAZEM HCL ER COATED BEADS 180 MG PO CP24
180.0000 mg | ORAL_CAPSULE | Freq: Every day | ORAL | Status: DC
  Administered 2012-06-05 – 2012-06-06 (×2): 180 mg via ORAL
  Filled 2012-06-05 (×3): qty 1

## 2012-06-05 MED ORDER — WARFARIN SODIUM 2.5 MG PO TABS
12.5000 mg | ORAL_TABLET | Freq: Once | ORAL | Status: AC
  Administered 2012-06-05: 12.5 mg via ORAL
  Filled 2012-06-05: qty 1

## 2012-06-05 NOTE — Progress Notes (Signed)
PROGRESS NOTE  Subjective:   Pt is a 66 yo with chronic diastolic CHF and A-fib.  I and O are not accurate.  He cannot measure his urine output because of swollen scrotum and the need to urinate into a towel.  Wt has not been done yet today. EF is 50-55%.  Severe LVH with DD.   Objective:    Vital Signs:   Temp:  [98 F (36.7 C)-98.2 F (36.8 C)] 98 F (36.7 C) 06-23-2023 2200) Pulse Rate:  [71-75] 75  06-23-23 2200) Resp:  [15-19] 15  June 23, 2023 2200) BP: (168-194)/(87-110) 176/87 mmHg 06-23-2023 2249) SpO2:  [93 %] 93 % Jun 23, 2023 2200)  Last BM Date: 06/03/12   24-hour weight change: Weight change:   Weight trends: Filed Weights   06/02/12 1136 06/03/12 0500 22-Jun-2012 0512  Weight: 336 lb (152.409 kg) 236 lb 12.4 oz (107.4 kg) 319 lb 3.6 oz (144.8 kg)    Intake/Output:  06-23-23 0701 - 06/29 0700 In: 1080 [P.O.:1080] Out: 325 [Urine:325] Total I/O In: 960 [P.O.:960] Out: -    Physical Exam: BP 176/87  Pulse 75  Temp 98 F (36.7 C) (Oral)  Resp 15  Ht 5\' 9"  (1.753 m)  Wt 319 lb 3.6 oz (144.8 kg)  BMI 47.14 kg/m2  SpO2 93%  General: Vital signs reviewed and noted. Well-developed, well-nourished, in no acute distress; alert, appropriate and cooperative .  Head: Normocephalic, atraumatic.  Eyes: conjunctivae/corneas clear.  EOM's intact.   Throat: normal  Neck: Supple. Normal carotids. No JVD  Lungs:  Clear to auscultation  Heart: Irregularly irregular,  With normal  S1 S2. No murmurs, gallops or rubs  Abdomen:  Soft, non-tender, non-distended with normoactive bowel sounds. No hepatomegaly. No rebound/guarding. No abdominal masses.  Edematous scrotum.  Extremities: Tense, brawny edema bilaterally.  Neurologic: A&O X3, CN II - XII are grossly intact. Motor strength is 5/5 in the all 4 extremities.  Psych: Responds to questions appropriately with normal affect.    Labs: BMET:  Basename 06/05/12 0605 2012-06-22 1638 Jun 22, 2012 0605 06/03/12 1425  NA 134* 135 -- --  K 3.7 3.1*  -- --  CL 89* 88* -- --  CO2 33* 37* -- --  GLUCOSE 114* 93 -- --  BUN 12 11 -- --  CREATININE 0.75 0.75 -- --  CALCIUM 9.4 9.7 -- --  MG -- -- 1.7 1.4*  PHOS -- -- -- --    Liver function tests: No results found for this basename: AST:2,ALT:2,ALKPHOS:2,BILITOT:2,PROT:2,ALBUMIN:2 in the last 72 hours No results found for this basename: LIPASE:2,AMYLASE:2 in the last 72 hours  CBC:  Basename Jun 22, 2012 0605 06/03/12 0500 06/02/12 1315  WBC 6.5 6.5 --  NEUTROABS -- -- 5.3  HGB 12.7* 12.9* --  HCT 39.6 38.7* --  MCV 91.7 89.6 --  PLT 218 231 --    Cardiac Enzymes: No results found for this basename: CKTOTAL:4,CKMB:4,TROPONINI:4 in the last 72 hours  Coagulation Studies:  Basename 06/05/12 0605 Jun 22, 2012 0605 06/03/12 0500 06/02/12 1315  LABPROT 17.2* 16.1* 15.4* 15.1  INR 1.38 1.26 1.19 1.17     Tele  : a-Fib  Medications:    Infusions:    Scheduled Medications:    . carvedilol  25 mg Oral BID WC  . cloNIDine  0.1 mg Oral BID  . enoxaparin (LOVENOX) injection  40 mg Subcutaneous Q24H  . furosemide  80 mg Intravenous Q8H  . lisinopril  20 mg Oral BID  . magnesium oxide  400 mg Oral Daily  .  potassium chloride SA  40 mEq Oral TID  . sodium chloride  3 mL Intravenous Q12H  . spironolactone  25 mg Oral Daily  . warfarin  10 mg Oral ONCE-1800  . warfarin  12.5 mg Oral ONCE-1800  . Warfarin - Pharmacist Dosing Inpatient   Does not apply q1800    Assessment/ Plan:     Acute on chronic diastolic CHF (congestive heart failure) (06/02/2012)   Continue aggressive diuresis.  Atrial fibrillation (04/27/2012) Rate is well controlled.  Hypertension (04/27/2012)  will add Diltiazem 180 CD qd.  - should also help with his diastolic dysfunction  Disposition:   Length of Stay: 3  Vesta Mixer, Montez Hageman., MD, [facility] 06/05/2012, 12:35 PM Office [phone] Pager [phone]

## 2012-06-05 NOTE — Progress Notes (Signed)
ANTICOAGULATION CONSULT NOTE - Follow Up Consult  Pharmacy Consult for coumadin Indication: atrial fibrillation  Allergies  Allergen Reactions  . Amlodipine Besylate Swelling    "started off low and ended up severe; if, in fact, that is what's causing the swelling" (06/03/12)    Patient Measurements: Height: 5\' 9"  (175.3 cm) Weight: 319 lb 3.6 oz (144.8 kg) IBW/kg (Calculated) : 70.7   Vital Signs: Temp: 98 F (36.7 C) (06/28 2200) BP: 176/87 mmHg (06/28 2249) Pulse Rate: 75  (06/28 2200)  Labs:  Basename 06/05/12 0605 06/04/12 1638 06/04/12 0605 06/03/12 0500 06/02/12 1315  HGB -- -- 12.7* 12.9* --  HCT -- -- 39.6 38.7* 39.6  PLT -- -- 218 231 215  APTT -- -- -- -- 33  LABPROT 17.2* -- 16.1* 15.4* --  INR 1.38 -- 1.26 1.19 --  HEPARINUNFRC -- -- -- -- --  CREATININE 0.75 0.75 0.68 -- --  CKTOTAL -- -- -- -- --  CKMB -- -- -- -- --  TROPONINI -- -- -- -- --    Estimated Creatinine Clearance: 128.9 ml/min (by C-G formula based on Cr of 0.75).   Medications:  Scheduled:     . carvedilol  25 mg Oral BID WC  . cloNIDine  0.1 mg Oral BID  . enoxaparin (LOVENOX) injection  40 mg Subcutaneous Q24H  . furosemide  80 mg Intravenous Q8H  . lisinopril  20 mg Oral BID  . magnesium oxide  400 mg Oral Daily  . magnesium sulfate 1 - 4 g bolus IVPB  1 g Intravenous Once  . potassium chloride SA  40 mEq Oral TID  . sodium chloride  3 mL Intravenous Q12H  . spironolactone  25 mg Oral Daily  . warfarin  10 mg Oral ONCE-1800  . Warfarin - Pharmacist Dosing Inpatient   Does not apply q1800  . DISCONTD: furosemide  80 mg Intravenous BID  . DISCONTD: magnesium sulfate  1 g Intravenous Once    Assessment: 66 yo who was admitted for CHF. He has been on coumadin for afib and INR was subtherapeutic (1.23) on coumadin 5mg /day PTA. INR=1.38 today. Minimal INR response after 2 doses of 10mg .  Goal of Therapy:  INR 2-3 Monitor platelets by anticoagulation protocol: Yes   Plan:    -Coumadin 12.5mg  po x1 today -If for home today consider coumadin 10mg /day with INR check on Monday. -PT/INR daily  Harland German, Pharm D 06/05/2012 7:47 AM

## 2012-06-05 NOTE — Progress Notes (Addendum)
Patient had a 2.01 second pause @ 3:24 AM. Patient was asymptomatic and resting comfortably. Afebrile P 60 R22 BP 180/72 02 dropping to the 80's on RA. Patient placed on 2.5 L N/C. 02 saturations increased to 98 % on 2.5 L N/C. Dr. On call Notified and made aware. Will continue to monitor patient.

## 2012-06-06 LAB — CBC
MCHC: 32.8 g/dL (ref 30.0–36.0)
Platelets: 232 10*3/uL (ref 150–400)
RDW: 14.1 % (ref 11.5–15.5)

## 2012-06-06 LAB — PROTIME-INR
INR: 1.39 (ref 0.00–1.49)
Prothrombin Time: 17.3 seconds — ABNORMAL HIGH (ref 11.6–15.2)

## 2012-06-06 LAB — URIC ACID: Uric Acid, Serum: 10.2 mg/dL — ABNORMAL HIGH (ref 4.0–7.8)

## 2012-06-06 MED ORDER — BARRIER CREAM NON-SPECIFIED
1.0000 "application " | TOPICAL_CREAM | Freq: Two times a day (BID) | TOPICAL | Status: DC
  Administered 2012-06-06 – 2012-06-11 (×11): 1 via TOPICAL
  Filled 2012-06-06: qty 1

## 2012-06-06 MED ORDER — WARFARIN SODIUM 10 MG PO TABS
12.5000 mg | ORAL_TABLET | Freq: Once | ORAL | Status: AC
  Administered 2012-06-06: 12.5 mg via ORAL
  Filled 2012-06-06: qty 1

## 2012-06-06 NOTE — Progress Notes (Addendum)
SUBJECTIVE:Depressed. Breathing some better, but continues scrotal edema.   Principal Problem:  *Acute on chronic diastolic CHF (congestive heart failure) Active Problems:  Atrial fibrillation  Hypertension   LABS: Basic Metabolic Panel:  Basename 06/05/12 0605 06/04/12 1638 06/04/12 0605 06/03/12 1425  NA 134* 135 -- --  K 3.7 3.1* -- --  CL 89* 88* -- --  CO2 33* 37* -- --  GLUCOSE 114* 93 -- --  BUN 12 11 -- --  CREATININE 0.75 0.75 -- --  CALCIUM 9.4 9.7 -- --  MG -- -- 1.7 1.4*  PHOS -- -- -- --   Liver Function Tests: CBC:  Basename 06/06/12 0515 06/04/12 0605  WBC 9.3 6.5  NEUTROABS -- --  HGB 12.3* 12.7*  HCT 37.5* 39.6  MCV 90.8 91.7  PLT 232 218   Echocardiogram: Left ventricle: The cavity size was normal. Wall thickness was increased in a pattern of severe LVH. Systolic function was normal. The estimated ejection fraction was in the range of 50% to 55%. Wall motion was normal; there were no regional wall motion abnormalities. - Mitral valve: Calcified annulus. - Left atrium: The atrium was moderately dilated. - Right ventricle: The cavity size was mildly dilated. - Right atrium: The atrium was mildly dilated. Impressions:   Technically difficult; severe LVH noted and biatrial enlargement; consider infiltrative cardiomyopathy such as amyloid.    PHYSICAL EXAM BP 138/74  Pulse 56  Temp 98.1 F (36.7 C) (Oral)  Resp 20  Ht 5\' 9"  (1.753 m)  Wt 307 lb 15.7 oz (139.7 kg)  BMI 45.48 kg/m2  SpO2 94%Wt Loss: 8 lbs from highest recorded weight on 06/04/12, but 21 lbs from admission recorded weight. General: Well developed, well nourished, in no acute distress Head: Eyes PERRLA, No xanthomas.   Normal cephalic and atramatic  Lungs: Clear bilaterally to auscultation and percussion. Heart: HRRR S1 S2, No MRG .  Pulses are 2+ & equal.            No carotid bruit. No JVD.  No abdominal bruits. No femoral bruits. Abdomen: Bowel sounds are positive,  abdomen soft and non-tender without masses or                  Hernia's noted. Msk:  Back normal, normal gait. Normal strength and tone for age. Massive scrotal edema.  Extremities: No clubbing, cyanosis 1-2+ edema .  DP +1 Neuro: Alert and oriented X 3. Psych: Flat affect, responds appropriately  TELEMETRY: Reviewed telemetry pt in Atrial fib with LVH  ASSESSMENT AND PLAN:  1. Acute on Chronic Diastolic Heart Failure: I am uncertain of his dry wt. He continues to have good diuresis but into a towel as his scrotum is very edematous. He is down 21 lbs since admission weight. He is still on Lasix, 80 mg Q 8 hours and spironolactone 25 mg with no diureses recorded because of not being able to measure. Going by weights only. I think he needs another 20 lbs more of diureses. Creatinine 0.75. He is not anemic. Will check CMET to evaluate albumin status and LFT's. Continue current diuretic regimen. Consider scrotal sling and peri-care to avoid skin breakdown with use of towel to urinate into. Consider urology consults for insertion of urinary catheter.  2. Hypertension: Blood pressure controlled at present. Cardizem 180 mg was added yesterday by Dr. Elease Hashimoto.   3. Atrial fibrillation: Heart rate is well controlled at present. He remains on coumadin per pharmacy.  Bettey Mare. Lyman Bishop NP Conley Rolls  [facility] 06/06/2012, 8:58 AM  Attending Note:   The patient was seen and examined.  Agree with assessment and plan as noted above.  Pt continues to diurese.  Doing well with Lasix.  I am concerned about skin break down. We may be able to consider Foley if his edema improves a bit more. Lungs remain clear   Alvia Grove., MD, [facility] 06/06/2012, 1:06 PM

## 2012-06-06 NOTE — Progress Notes (Signed)
ANTICOAGULATION CONSULT NOTE - Follow Up Consult  Pharmacy Consult for coumadin Indication: atrial fibrillation  Allergies  Allergen Reactions  . Amlodipine Besylate Swelling    "started off low and ended up severe; if, in fact, that is what's causing the swelling" (06/03/12)    Patient Measurements: Height: 5\' 9"  (175.3 cm) Weight: 307 lb 15.7 oz (139.7 kg) IBW/kg (Calculated) : 70.7   Vital Signs: Temp: 98.1 F (36.7 C) (06/30 0500) BP: 138/74 mmHg (06/30 0500) Pulse Rate: 56  (06/30 0500)  Labs:  Basename 06/06/12 0515 06/05/12 0605 06/04/12 1638 06/04/12 0605  HGB 12.3* -- -- 12.7*  HCT 37.5* -- -- 39.6  PLT 232 -- -- 218  APTT -- -- -- --  LABPROT 17.3* 17.2* -- 16.1*  INR 1.39 1.38 -- 1.26  HEPARINUNFRC -- -- -- --  CREATININE -- 0.75 0.75 0.68  CKTOTAL -- -- -- --  CKMB -- -- -- --  TROPONINI -- -- -- --    Estimated Creatinine Clearance: 126.3 ml/min (by C-G formula based on Cr of 0.75).   Medications:  Scheduled:     . carvedilol  25 mg Oral BID WC  . cloNIDine  0.1 mg Oral BID  . diltiazem  180 mg Oral Daily  . enoxaparin (LOVENOX) injection  40 mg Subcutaneous Q24H  . furosemide  80 mg Intravenous Q8H  . lisinopril  20 mg Oral BID  . magnesium oxide  400 mg Oral Daily  . potassium chloride SA  40 mEq Oral TID  . sodium chloride  3 mL Intravenous Q12H  . spironolactone  25 mg Oral Daily  . warfarin  12.5 mg Oral ONCE-1800  . Warfarin - Pharmacist Dosing Inpatient   Does not apply q1800    Assessment: 66 yo who was admitted for CHF. He has been on coumadin for afib and INR was subtherapeutic (1.23) on coumadin 5mg /day PTA. INR=1.39 today. Minimal INR response after 2 doses of 10mg  and 1 dose of 12.5mg . Dose increased to 12.5mg  06/05/12.  Goal of Therapy:  INR 2-3 Monitor platelets by anticoagulation protocol: Yes   Plan:  -Coumadin 12.5mg  po x1 today -If for home today consider coumadin 12.5mg /day    Harland German, Pharm D 06/06/2012 8:03  AM

## 2012-06-07 LAB — COMPREHENSIVE METABOLIC PANEL
AST: 14 U/L (ref 0–37)
Albumin: 3.3 g/dL — ABNORMAL LOW (ref 3.5–5.2)
BUN: 25 mg/dL — ABNORMAL HIGH (ref 6–23)
Calcium: 9.5 mg/dL (ref 8.4–10.5)
Chloride: 91 mEq/L — ABNORMAL LOW (ref 96–112)
Creatinine, Ser: 0.89 mg/dL (ref 0.50–1.35)
Total Bilirubin: 0.9 mg/dL (ref 0.3–1.2)

## 2012-06-07 LAB — PROTIME-INR: Prothrombin Time: 20.9 seconds — ABNORMAL HIGH (ref 11.6–15.2)

## 2012-06-07 MED ORDER — HYDROCERIN EX CREA
TOPICAL_CREAM | Freq: Every day | CUTANEOUS | Status: DC
  Administered 2012-06-07 – 2012-06-11 (×5): via TOPICAL
  Filled 2012-06-07: qty 113

## 2012-06-07 MED ORDER — CLONIDINE HCL 0.2 MG PO TABS
0.2000 mg | ORAL_TABLET | Freq: Two times a day (BID) | ORAL | Status: DC
  Administered 2012-06-07 (×2): 0.2 mg via ORAL
  Filled 2012-06-07 (×4): qty 1

## 2012-06-07 MED ORDER — METOLAZONE 2.5 MG PO TABS
2.5000 mg | ORAL_TABLET | Freq: Once | ORAL | Status: AC
  Administered 2012-06-07: 2.5 mg via ORAL
  Filled 2012-06-07: qty 1

## 2012-06-07 MED ORDER — WARFARIN SODIUM 2.5 MG PO TABS
12.5000 mg | ORAL_TABLET | Freq: Once | ORAL | Status: AC
  Administered 2012-06-07: 12.5 mg via ORAL
  Filled 2012-06-07: qty 1

## 2012-06-07 NOTE — Progress Notes (Signed)
Pt's HR dropping down to 38 nonsustained with rate returning to 40-50s afib while sleeping.  Pt awakened and denies any complaints.  Oxygen reapplied at 2L per [place], noted pt has paused at night previously.  Will continue to monitor.

## 2012-06-07 NOTE — Plan of Care (Signed)
Problem: Phase II Progression Outcomes Goal: Walk in hall or up in chair TID Outcome: Completed/Met Date Met:  06/07/12 Up to chair/recliner most of the day today

## 2012-06-07 NOTE — Progress Notes (Signed)
ANTICOAGULATION CONSULT NOTE - Follow Up Consult  Pharmacy Consult for coumadin Indication: atrial fibrillation  Allergies  Allergen Reactions  . Amlodipine Besylate Swelling    "started off low and ended up severe; if, in fact, that is what's causing the swelling" (06/03/12)   Assessment: 66 yo who was admitted for CHF. He has been on coumadin for afib and INR was subtherapeutic (1.23) on coumadin 5mg /day PTA. INR=1.77 today. Minimal INR response after 2 doses of 10mg  and 1 dose of 12.5mg . Dose increased to 12.5mg  06/05/12.  He has started to trend up now with dose increase.  No bleeding complications noted.    Goal of Therapy:  INR 2-3 Monitor platelets by anticoagulation protocol: Yes   Plan:  -Coumadin 12.5mg  po x1 today -Monitor CBC for bleeding complications -Monitor AM PT/INR  Patient Measurements: Height: 5\' 9"  (175.3 cm) Weight: 306 lb 7 oz (139 kg) IBW/kg (Calculated) : 70.7   Vital Signs: Temp: 98.3 F (36.8 C) (07/01 0500) BP: 149/81 mmHg (07/01 0500) Pulse Rate: 55  (07/01 0500)  Labs:  Basename 06/07/12 0630 06/06/12 0515 06/05/12 0605 06/04/12 1638  HGB -- 12.3* -- --  HCT -- 37.5* -- --  PLT -- 232 -- --  APTT -- -- -- --  LABPROT 20.9* 17.3* 17.2* --  INR 1.77* 1.39 1.38 --  HEPARINUNFRC -- -- -- --  CREATININE 0.89 -- 0.75 0.75  CKTOTAL -- -- -- --  CKMB -- -- -- --  TROPONINI -- -- -- --   Estimated Creatinine Clearance: 113.2 ml/min (by C-G formula based on Cr of 0.89).  Medications:  Scheduled:     . barrier cream  1 application Topical Q12H  . carvedilol  25 mg Oral BID WC  . cloNIDine  0.2 mg Oral BID  . enoxaparin (LOVENOX) injection  40 mg Subcutaneous Q24H  . furosemide  80 mg Intravenous Q8H  . lisinopril  20 mg Oral BID  . magnesium oxide  400 mg Oral Daily  . potassium chloride SA  40 mEq Oral TID  . sodium chloride  3 mL Intravenous Q12H  . spironolactone  25 mg Oral Daily  . warfarin  12.5 mg Oral ONCE-1800  . Warfarin -  Pharmacist Dosing Inpatient   Does not apply [id number]   Nadara Mustard, PharmD., MS Clinical Pharmacist Pager:  [phone]  Thank you for allowing pharmacy to be part of this patients care team.  06/07/2012 9:18 AM

## 2012-06-07 NOTE — Progress Notes (Signed)
Patient ID: Jesus [name], male   DOB: March 31, 1946, 66 y.o.   MRN: [id number]     SUBJECTIVE: Continuing to diurese.  Still with scrotal swelling and having to urinate into towel.  Weight coming down, not as fast yesterday.       . barrier cream  1 application Topical Q12H  . carvedilol  25 mg Oral BID WC  . cloNIDine  0.2 mg Oral BID  . enoxaparin (LOVENOX) injection  40 mg Subcutaneous Q24H  . furosemide  80 mg Intravenous Q8H  . lisinopril  20 mg Oral BID  . magnesium oxide  400 mg Oral Daily  . potassium chloride SA  40 mEq Oral TID  . sodium chloride  3 mL Intravenous Q12H  . spironolactone  25 mg Oral Daily  . warfarin  12.5 mg Oral ONCE-1800  . Warfarin - Pharmacist Dosing Inpatient   Does not apply q1800  . DISCONTD: cloNIDine  0.1 mg Oral BID  . DISCONTD: diltiazem  180 mg Oral Daily      Filed Vitals:   06/06/12 0500 06/06/12 1400 06/06/12 2100 06/07/12 0500  BP: 138/74 137/77 165/93 149/81  Pulse: 56 68 78 55  Temp: 98.1 F (36.7 C) 98.4 F (36.9 C) 99.3 F (37.4 C) 98.3 F (36.8 C)  TempSrc:  Oral    Resp: 20 20 20 20   Height:      Weight: 139.7 kg (307 lb 15.7 oz)   139 kg (306 lb 7 oz)  SpO2: 94% 93% 91% 94%    Intake/Output Summary (Last 24 hours) at 06/07/12 0742 Last data filed at 06/06/12 2100  Gross per 24 hour  Intake   1200 ml  Output      0 ml  Net   1200 ml    LABS: Basic Metabolic Panel:  Basename 06/05/12 0605 06/04/12 1638  NA 134* 135  K 3.7 3.1*  CL 89* 88*  CO2 33* 37*  GLUCOSE 114* 93  BUN 12 11  CREATININE 0.75 0.75  CALCIUM 9.4 9.7  MG -- --  PHOS -- --   Liver Function Tests: No results found for this basename: AST:2,ALT:2,ALKPHOS:2,BILITOT:2,PROT:2,ALBUMIN:2 in the last 72 hours No results found for this basename: LIPASE:2,AMYLASE:2 in the last 72 hours CBC:  Basename 06/06/12 0515  WBC 9.3  NEUTROABS --  HGB 12.3*  HCT 37.5*  MCV 90.8  PLT 232    RADIOLOGY: No results found.  PHYSICAL EXAM General:  NAD Neck: JVP 10 cm, no thyromegaly or thyroid nodule.  Lungs: Clear to auscultation bilaterally with normal respiratory effort. CV: Nondisplaced PMI.  Heart irregular S1/S2, no S3/S4, no murmur.  2+ chronic edema to thighs.  Scrotal edema.  No carotid bruit.  Abdomen: Soft, nontender, no hepatosplenomegaly, no distention.  Neurologic: Alert and oriented x 3.  Psych: Normal affect. Extremities: No clubbing or cyanosis.   TELEMETRY: Reviewed telemetry pt in atrial fibrillation.  Occasional brady to upper 30s during sleep.   ASSESSMENT AND PLAN:  66 yo with history of chronic atrial fibrillation and diastolic CHF was admitted with volume overload.  1. Atrial fibrillation: Continue coumadin, don't have to overlap with therapeutic Lovenox.  Good rate control with Coreg.  Will stop diltiazem given increased edema with CCBs as well as nocturnal bradycardia.  2. Acute on chronic diastolic CHF: Needs more diuresis. Continue IV Lasix.  Replete K.  If creatinine stable this morning, will likely add a dose of metolazone.  Refuses foley this morning.  Will have wound nurse  look at wrapped legs and scrotum.  If he has significant scrotal breakdown, will push foley harder.  3. HTN: Better.  Increase clonidine.  As above, will not use diltiazem.   [name] 06/07/2012 7:42 AM

## 2012-06-07 NOTE — Progress Notes (Signed)
UR Completed Kishia Shackett Graves-Bigelow, RN,BSN [phone]  

## 2012-06-07 NOTE — Progress Notes (Signed)
CSW received referral for potential disposition needs. .Clinical social worker continuing to follow pt to assist with pt dc plans and further csw needs.   Catha Gosselin, Theresia Majors  [phone] .06/07/2012 1246pm

## 2012-06-07 NOTE — Consult Note (Signed)
WOC consult Note Reason for Consult: Consult requested for BLE.  Pt has been followed by Dr Lajoyce Corners as an outpatient for leg wraps and states they were due to be discontinued today in the office.  Denies any open wounds under the bilat compression wraps, and states he is to start wearing compression stockings which he will have someone bring in from home. Wound type: Removed a combination of 3 layer Profore and also una boot wraps. No open wounds or drainage noted, dry scaly skin to BLE. No further need for topical wound care.  Plan:  Pt will begin wearing compression stockings as was previously ordered.  Eucerin cream to assist with removal of dry scaly skin. Will not plan to follow further unless re-consulted.  [address], RN, MSN, [employer]  [phone]

## 2012-06-08 LAB — BASIC METABOLIC PANEL
Calcium: 10.1 mg/dL (ref 8.4–10.5)
Chloride: 87 mEq/L — ABNORMAL LOW (ref 96–112)
Creatinine, Ser: 0.9 mg/dL (ref 0.50–1.35)
GFR calc Af Amer: >90 mL/min (ref >90)
Sodium: 135 mEq/L (ref 135–145)

## 2012-06-08 LAB — PROTIME-INR
INR: 1.85 — ABNORMAL HIGH (ref 0.00–1.49)
Prothrombin Time: 21.7 seconds — ABNORMAL HIGH (ref 11.6–15.2)

## 2012-06-08 MED ORDER — CLONIDINE HCL 0.3 MG PO TABS
0.3000 mg | ORAL_TABLET | Freq: Two times a day (BID) | ORAL | Status: DC
  Administered 2012-06-08 – 2012-06-10 (×6): 0.3 mg via ORAL
  Filled 2012-06-08 (×8): qty 1

## 2012-06-08 MED ORDER — POTASSIUM CHLORIDE 20 MEQ PO PACK: 40.0000 meq | PACK | Freq: Once | ORAL | Status: DC

## 2012-06-08 MED ORDER — COLCHICINE 0.6 MG PO TABS
0.6000 mg | ORAL_TABLET | Freq: Two times a day (BID) | ORAL | Status: DC
  Administered 2012-06-08 – 2012-06-11 (×7): 0.6 mg via ORAL
  Filled 2012-06-08 (×8): qty 1

## 2012-06-08 MED ORDER — WARFARIN SODIUM 7.5 MG PO TABS
15.0000 mg | ORAL_TABLET | Freq: Once | ORAL | Status: AC
  Administered 2012-06-08: 15 mg via ORAL
  Filled 2012-06-08: qty 2

## 2012-06-08 MED ORDER — METOLAZONE 2.5 MG PO TABS
2.5000 mg | ORAL_TABLET | Freq: Once | ORAL | Status: AC
  Administered 2012-06-08: 2.5 mg via ORAL
  Filled 2012-06-08: qty 1

## 2012-06-08 MED ORDER — POTASSIUM CHLORIDE CRYS ER 20 MEQ PO TBCR
40.0000 meq | EXTENDED_RELEASE_TABLET | Freq: Once | ORAL | Status: AC
  Administered 2012-06-08: 40 meq via ORAL

## 2012-06-08 NOTE — Clinical Social Work Psychosocial (Signed)
     Clinical Social Work Department BRIEF PSYCHOSOCIAL ASSESSMENT 06/08/2012  Patient:  Jesus [name], Jesus [name]     Account Number:  [id number]     Admit date:  06/02/2012  Clinical Social Worker:  Doree Albee  Date/Time:  06/08/2012 02:00 PM  Referred by:  RN  Date Referred:  06/08/2012 Referred for  SNF Placement   Other Referral:   Interview type:  Patient Other interview type:    PSYCHOSOCIAL DATA Living Status:  ALONE Admitted from facility:   Level of care:   Primary support name:  [name] Martinezlopez Primary support relationship to patient:  CHILD, ADULT Degree of support available:   strong    CURRENT CONCERNS Current Concerns  Post-Acute Placement   Other Concerns:    SOCIAL WORK ASSESSMENT / PLAN CSW and Rn case manager met with pt to discuss pt current living enivronemnt and pt discharge plans.    Pt shared that he currently lives alone however has very supportive son and daughter. Pt states that he has been dealing with the gout pain and the aches from previous injuries. Pt states some days are worse than the other but he makes the best of it.    Pt is very concerned that the medical team is going to push patient to do something he does not wish to do so. CSW and Rn case manager ensure patient that all decisions would be made by patient as long as pt was said to be competent.    Pt, Rn case mnager, and csw discussed the importance of working with phsycial therapy as patient MD will not allow patient to discharge home if not up and walking.    Pt felt that PT would say he had to go to rehab. CSW and RN said that pt had the right to make the decision that was best for him as long as pt was willing to work with physical therapy and provide evidence that patient is able to get up and provide care for himself.    Pt agreed to work with physical therapy again. At this time pt plan is to dc home iwth home health services. CSW will sign off at this time.    Assessment/plan status:  No Further Intervention Required Other assessment/ plan:   Information/referral to community resources:   Home health, Skilled nursing for short term rehab, physical therapy information    PATIENTS/FAMILYS RESPONSE TO PLAN OF CARE: Pt thanked csw for concern and support. Pt thanked csw for understanding and respecting pt concern to to make his own decisions. CSW ensured pt that rn case manager and csw would advocate for pt wishes as long as it was a safe discharge plan.

## 2012-06-08 NOTE — Progress Notes (Signed)
PT Cancellation Note  Treatment cancelled today due to patient's refusal to participate secondary to gout pain. Patient feels that this will decrease over the next day or so and will be happy to participate at this time, but currently at rest he has no pain and does not wish to stand from chair as it is too painful.  Edwyna Perfect, PT  Pager [phone]  06/08/2012, 8:55 AM

## 2012-06-08 NOTE — Progress Notes (Signed)
ANTICOAGULATION CONSULT NOTE - Follow Up Consult  Pharmacy Consult for coumadin Indication: atrial fibrillation  Allergies  Allergen Reactions  . Amlodipine Besylate Swelling    "started off low and ended up severe; if, in fact, that is what's causing the swelling" (06/03/12)   Assessment: 66 yo who was admitted for CHF. He has chronic Atrial Fibrillation for which he has been on chronic Warfarin therapy.   INR=1.85 today, no CBC but no s/s of bleeding. Dose summary as follows:    6/26 Warfarin 5 mg  6/27   Warfarin 10mg   6/28   Warfarin 10mg   6/29   Warfarin 12.5mg   6/30 Warfarin 12.5mg   7/01 Warfarin 12.5mg   Home Dose:  Listed as Warfarin 5mg  daily - but was sub-therapeutic on admit with INR = 1.23  Goal of Therapy:  INR 2-3 Monitor platelets by anticoagulation protocol: Yes   Plan:  -Coumadin 15mg  po x1 today -Monitor CBC for bleeding complications -Monitor AM PT/INR  Patient Measurements: Height: 5\' 9"  (175.3 cm) Weight: 299 lb 4.8 oz (135.762 kg) IBW/kg (Calculated) : 70.7   Vital Signs: Temp: 98.2 F (36.8 C) (07/02 0500) Temp src: Oral (07/02 0500) BP: 154/82 mmHg (07/02 0500) Pulse Rate: 76  (07/02 0500)  Labs:  Basename 06/08/12 0540 06/07/12 0630 06/06/12 0515  HGB -- -- 12.3*  HCT -- -- 37.5*  PLT -- -- 232  APTT -- -- --  LABPROT 21.7* 20.9* 17.3*  INR 1.85* 1.77* 1.39  HEPARINUNFRC -- -- --  CREATININE 0.90 0.89 --  CKTOTAL -- -- --  CKMB -- -- --  TROPONINI -- -- --   Estimated Creatinine Clearance: 110.4 ml/min (by C-G formula based on Cr of 0.9).  Medications:  Scheduled:     . barrier cream  1 application Topical Q12H  . carvedilol  25 mg Oral BID WC  . cloNIDine  0.2 mg Oral BID  . enoxaparin (LOVENOX) injection  40 mg Subcutaneous Q24H  . furosemide  80 mg Intravenous Q8H  . lisinopril  20 mg Oral BID  . magnesium oxide  400 mg Oral Daily  . potassium chloride SA  40 mEq Oral TID  . sodium chloride  3 mL Intravenous Q12H  .  spironolactone  25 mg Oral Daily  . warfarin  12.5 mg Oral ONCE-1800  . Warfarin - Pharmacist Dosing Inpatient   Does not apply [id number]   Nadara Mustard, PharmD., MS Clinical Pharmacist Pager:  [phone]  Thank you for allowing pharmacy to be part of this patients care team.  06/08/2012 8:55 AM

## 2012-06-08 NOTE — Progress Notes (Signed)
Patient ID: Jesus [name], male   DOB: Sep 23, 1946, 66 y.o.   MRN: [id number]     SUBJECTIVE: Good diuresis yesterday with addition of metolazone.  Scrotal swelling coming down.  Now with right knee pain making it hard to bear weight.  Has history of gout.        . barrier cream  1 application Topical Q12H  . carvedilol  25 mg Oral BID WC  . cloNIDine  0.3 mg Oral BID  . colchicine  0.6 mg Oral BID  . enoxaparin (LOVENOX) injection  40 mg Subcutaneous Q24H  . furosemide  80 mg Intravenous Q8H  . hydrocerin   Topical Daily  . lisinopril  20 mg Oral BID  . magnesium oxide  400 mg Oral Daily  . metolazone  2.5 mg Oral Once  . metolazone  2.5 mg Oral Once  . potassium chloride  40 mEq Oral Once  . potassium chloride SA  40 mEq Oral TID  . sodium chloride  3 mL Intravenous Q12H  . spironolactone  25 mg Oral Daily  . warfarin  12.5 mg Oral ONCE-1800  . Warfarin - Pharmacist Dosing Inpatient   Does not apply q1800  . DISCONTD: cloNIDine  0.2 mg Oral BID      Filed Vitals:   06/07/12 1038 06/07/12 1354 06/07/12 2100 06/08/12 0500  BP: 128/89 121/65 140/68 154/82  Pulse:  58 70 76  Temp:  97.5 F (36.4 C) 98.2 F (36.8 C) 98.2 F (36.8 C)  TempSrc:  Oral Oral Oral  Resp:  18 20 18   Height:      Weight:    135.762 kg (299 lb 4.8 oz)  SpO2:  94% 97% 95%    Intake/Output Summary (Last 24 hours) at 06/08/12 0813 Last data filed at 06/07/12 1355  Gross per 24 hour  Intake    643 ml  Output      0 ml  Net    643 ml    LABS: Basic Metabolic Panel:  Basename 06/08/12 0540 06/07/12 0630  NA 135 135  K 3.7 4.3  CL 87* 91*  CO2 36* 33*  GLUCOSE 119* 108*  BUN 28* 25*  CREATININE 0.90 0.89  CALCIUM 10.1 9.5  MG -- --  PHOS -- --   Liver Function Tests:  Basename 06/07/12 0630  AST 14  ALT 11  ALKPHOS 154*  BILITOT 0.9  PROT 7.0  ALBUMIN 3.3*   No results found for this basename: LIPASE:2,AMYLASE:2 in the last 72 hours CBC:  Basename 06/06/12 0515  WBC 9.3   NEUTROABS --  HGB 12.3*  HCT 37.5*  MCV 90.8  PLT 232    RADIOLOGY: No results found.  PHYSICAL EXAM General: NAD Neck: JVP 8-9 cm, no thyromegaly or thyroid nodule.  Lungs: Clear to auscultation bilaterally with normal respiratory effort. CV: Nondisplaced PMI.  Heart irregular S1/S2, no S3/S4, no murmur.  1+ chronic edema to thighs.  Scrotal edema.  No carotid bruit.  Abdomen: Soft, nontender, no hepatosplenomegaly, no distention.  Neurologic: Alert and oriented x 3.  Psych: Normal affect. Extremities: No clubbing or cyanosis.  MSK: Right knee warm  TELEMETRY: Reviewed telemetry pt in atrial fibrillation.    ASSESSMENT AND PLAN:  66 yo with history of chronic atrial fibrillation and diastolic CHF was admitted with volume overload.  1. Atrial fibrillation: Continue coumadin, don't have to overlap with therapeutic Lovenox.  Good rate control with Coreg.   2. Acute on chronic diastolic CHF: Improving volume status.  Leg and scrotal swelling down, JVP lower.  Will give additional dose of metolazone 2.5 along with Lasix today.  Will probably transition to po torsemide tomorrow if he diureses well.  3. HTN: Better.  Increase clonidine to 0.3 bid.  Avoid CCBs with concern that this worsened edema.  4. Knee pain: Concern for gout in right knee.  Will start colchicine 0.6 mg bid today, then once daily.  Will check uric acid.  He was on allopurinol at some point in past.  5. Dispo: Possible discharge Thursday (after 1 day po diuretics).  He will need to be able to walk.  PT/OT.   [name] 06/08/2012 8:13 AM

## 2012-06-09 LAB — CBC
MCH: 29.9 pg (ref 26.0–34.0)
MCHC: 33.2 g/dL (ref 30.0–36.0)
Platelets: 339 10*3/uL (ref 150–400)
RBC: 4.61 MIL/uL (ref 4.22–5.81)

## 2012-06-09 LAB — URIC ACID: Uric Acid, Serum: 12.5 mg/dL — ABNORMAL HIGH (ref 4.0–7.8)

## 2012-06-09 LAB — PROTIME-INR
INR: 1.8 — ABNORMAL HIGH (ref 0.00–1.49)
Prothrombin Time: 21.2 seconds — ABNORMAL HIGH (ref 11.6–15.2)

## 2012-06-09 LAB — BASIC METABOLIC PANEL
Calcium: 10.4 mg/dL (ref 8.4–10.5)
GFR calc Af Amer: 85 mL/min — ABNORMAL LOW (ref >90)
GFR calc non Af Amer: 74 mL/min — ABNORMAL LOW (ref >90)
Potassium: 3.7 mEq/L (ref 3.5–5.1)
Sodium: 133 mEq/L — ABNORMAL LOW (ref 135–145)

## 2012-06-09 MED ORDER — WARFARIN SODIUM 7.5 MG PO TABS
15.0000 mg | ORAL_TABLET | Freq: Once | ORAL | Status: AC
  Administered 2012-06-09: 15 mg via ORAL
  Filled 2012-06-09: qty 2

## 2012-06-09 MED ORDER — POTASSIUM CHLORIDE 20 MEQ PO PACK
20.0000 meq | PACK | Freq: Once | ORAL | Status: DC
  Filled 2012-06-09: qty 1

## 2012-06-09 MED ORDER — TORSEMIDE 20 MG PO TABS
60.0000 mg | ORAL_TABLET | Freq: Every day | ORAL | Status: DC
  Administered 2012-06-09 – 2012-06-11 (×3): 60 mg via ORAL
  Filled 2012-06-09 (×4): qty 3

## 2012-06-09 MED ORDER — POTASSIUM CHLORIDE CRYS ER 20 MEQ PO TBCR
20.0000 meq | EXTENDED_RELEASE_TABLET | Freq: Once | ORAL | Status: AC
  Administered 2012-06-09: 20 meq via ORAL

## 2012-06-09 NOTE — Progress Notes (Signed)
ANTICOAGULATION CONSULT NOTE - Follow Up Consult  Pharmacy Consult for coumadin Indication: atrial fibrillation  Allergies  Allergen Reactions  . Amlodipine Besylate Swelling    "started off low and ended up severe; if, in fact, that is what's causing the swelling" (06/03/12)   Assessment: 66 yo who was admitted for CHF. He has chronic Atrial Fibrillation for which he has been on chronic Warfarin therapy - although admitting INR was only 1.2.  I discussed his therapy with him and told him he would likely need a higher dose at discharge.  He seemed to think it was ok if his level was low since he had not had a stroke or clots.  I explained to him that he was fortunate and that he was putting himself at risk for either.  He understood and seemed to be ok with higher dose.   INR=1.80 today, CBC is stable and he has no s/s of bleeding.  His INR dropped a little, but expect an up trend tomorrow after bumping his dose to 15mg .   (He will definitely need a higher discharge dose when he goes home).  Dose summary as follows:    6/26 Warfarin 5 mg  6/27   Warfarin 10mg   6/28   Warfarin 10mg   6/29   Warfarin 12.5mg   6/30 Warfarin 12.5mg   7/01 Warfarin 12.5mg   7/02 Warfarin 15 mg   Home Dose:  Listed as Warfarin 5mg  daily - but was sub-therapeutic on admit with INR = 1.23  Goal of Therapy:  INR 2-3 Monitor platelets by anticoagulation protocol: Yes   Plan:  -Coumadin 15mg  po x1 today -Monitor CBC for bleeding complications -Monitor AM PT/INR  Patient Measurements: Height: 5\' 9"  (175.3 cm) Weight: 293 lb 4.8 oz (133.04 kg) IBW/kg (Calculated) : 70.7   Vital Signs: Temp: 98.5 F (36.9 C) (07/03 0500) Temp src: Oral (07/03 0500) BP: 139/85 mmHg (07/03 0500) Pulse Rate: 67  (07/03 0500)  Labs:  Basename 06/09/12 0500 06/08/12 0540 06/07/12 0630  HGB 13.8 -- --  HCT 41.6 -- --  PLT 339 -- --  APTT -- -- --  LABPROT 21.2* 21.7* 20.9*  INR 1.80* 1.85* 1.77*  HEPARINUNFRC -- -- --    CREATININE 1.03 0.90 0.89  CKTOTAL -- -- --  CKMB -- -- --  TROPONINI -- -- --   Estimated Creatinine Clearance: 95.4 ml/min (by C-G formula based on Cr of 1.03).  Medications:  Scheduled:     . barrier cream  1 application Topical Q12H  . carvedilol  25 mg Oral BID WC  . cloNIDine  0.2 mg Oral BID  . enoxaparin (LOVENOX) injection  40 mg Subcutaneous Q24H  . furosemide  80 mg Intravenous Q8H  . lisinopril  20 mg Oral BID  . magnesium oxide  400 mg Oral Daily  . potassium chloride SA  40 mEq Oral TID  . sodium chloride  3 mL Intravenous Q12H  . spironolactone  25 mg Oral Daily  . warfarin  12.5 mg Oral ONCE-1800  . Warfarin - Pharmacist Dosing Inpatient   Does not apply [id number]   Nadara Mustard, PharmD., MS Clinical Pharmacist Pager:  [phone]  Thank you for allowing pharmacy to be part of this patients care team.  06/09/2012 10:21 AM

## 2012-06-09 NOTE — Progress Notes (Signed)
Csw provided patient with list of home health agencies to patient. Pt stated that he is still unsure if he will use home health services. Pt plans to discuss with pt son regarding if patient will utilize home health services. .No further Clinical Social Work needs, signing off.   Catha Gosselin, Theresia Majors  [phone] .06/09/2012 1552pm

## 2012-06-09 NOTE — Progress Notes (Signed)
06-09-12 Pt was discussed in Long Length of stay meeting today.   

## 2012-06-09 NOTE — Evaluation (Signed)
Physical Therapy Evaluation Patient Details Name: Jesus [name] MRN: [id number] DOB: February 26, 1946 Today's Date: 06/09/2012 Time: 4098-1191 PT Time Calculation (min): 22 min  PT Assessment / Plan / Recommendation Clinical Impression   Patient presents with CHF and gout exacerbations. He is currently limited functionally by his left knee pain. I feel that he will progress well with decreased pain and be able to return home with home health therapies. We will follow acutely to facilitate that transition.     PT Assessment  Patient needs continued PT services    Follow Up Recommendations  Home health PT;Supervision - Intermittent    Barriers to Discharge  None      Equipment Recommendations  Rolling walker with 5" wheels    Recommendations for Other Services  None  Frequency Min 3X/week    Precautions / Restrictions Precautions Precautions: Fall   Pertinent Vitals/Pain Pain of left knee related to gout exacerbation.      Mobility  Bed Mobility Details for Bed Mobility Assistance: N/T - Patient has been sleeping in the recliner. Transfers Transfers: Sit to Stand Sit to Stand: 4: Min assist;With upper extremity assist;From chair/3-in-1 Stand to Sit: 4: Min guard;With upper extremity assist;To chair/3-in-1 Details for Transfer Assistance: Patient required education on correct hand placement to and from device. He has difficulty initiating stand from lower surface secondary to insufficient left knee flexion due to pain and pain with weight bearing left leg.  Ambulation/Gait Ambulation/Gait Assistance: 4: Min guard Ambulation Distance (Feet): 30 Feet Assistive device: Rolling walker Ambulation/Gait Assistance Details: Improved stride length with increased distance. Education on safe use of device. Patient self correcting posture.  Gait Pattern: Step-through pattern;Decreased stride length;Decreased stance time - left Stood and stepped on and off the scale with verbal cues for step  sequencing and required minimal assistance only.     PT Diagnosis: Difficulty walking;Generalized weakness  PT Problem List: Decreased strength;Decreased range of motion;Decreased activity tolerance;Decreased mobility;Pain;Decreased knowledge of use of DME PT Treatment Interventions: DME instruction;Gait training;Stair training;Therapeutic activities;Therapeutic exercise;Patient/family education   PT Goals Acute Rehab PT Goals PT Goal Formulation: With patient Time For Goal Achievement: 06/16/12 Potential to Achieve Goals: Good Pt will go Supine/Side to Sit: with modified independence PT Goal: Supine/Side to Sit - Progress: Goal set today Pt will go Sit to Supine/Side: with modified independence PT Goal: Sit to Supine/Side - Progress: Goal set today Pt will go Sit to Stand: with modified independence;with upper extremity assist PT Goal: Sit to Stand - Progress: Goal set today Pt will go Stand to Sit: with modified independence;with upper extremity assist PT Goal: Stand to Sit - Progress: Goal set today Pt will Ambulate: 51 - 150 feet;with modified independence;with least restrictive assistive device PT Goal: Ambulate - Progress: Goal set today Pt will Go Up / Down Stairs: 1-2 stairs (simulated door frame use) PT Goal: Up/Down Stairs - Progress: Goal set today  Visit Information  Last PT Received On: 06/09/12 Assistance Needed: +1    Subjective Data  Subjective: Patient reports that his left knee is feeling a little better Patient Stated Goal: Home at discharge   Prior Functioning  Home Living Lives With: Alone Available Help at Discharge: Family;Available PRN/intermittently Type of Home: House Home Access: Stairs to enter [employer] of Steps: 1 Entrance Stairs-Rails:  (post/door frame) Home Layout: One level Bathroom Shower/Tub: Engineer, manufacturing systems: Standard Home Adaptive Equipment: Straight cane Prior Function Level of Independence: Independent  with assistive device(s) Able to Take Stairs?: Yes Driving: Yes Vocation:  Retired Musician: No difficulties Dominant Hand: Right    Cognition  Overall Cognitive Status: Appears within functional limits for tasks assessed/performed Arousal/Alertness: Awake/alert Orientation Level: Appears intact for tasks assessed Behavior During Session: [facility] for tasks performed    Extremity/Trunk Assessment Right Upper Extremity Assessment RUE ROM/Strength/Tone: Within functional levels RUE Coordination: WFL - gross/fine motor Left Upper Extremity Assessment LUE ROM/Strength/Tone: Within functional levels LUE Coordination: WFL - gross/fine motor Trunk Assessment Trunk Assessment: Normal Limited left knee flexion secondary to pain limitations. Left knee strength 3/5. Pain also with SLR limiting his ability to move left leg into hip abduction/adduction on the recliner without assistance.    Balance Balance Balance Assessed: Yes Static Standing Balance Static Standing - Level of Assistance: 4: Min assist Static Standing - Comment/# of Minutes: 7 High Level Balance High Level Balance Activites: Side stepping;Turns High Level Balance Comments: with walker without imbalance. Restrictions related to left stance only.   End of Session PT - End of Session Equipment Utilized During Treatment: Gait belt Activity Tolerance: Patient limited by pain Patient left:  (with OT) Nurse Communication: Mobility status   Edwyna Perfect, PT  Pager [phone]  06/09/2012, 9:23 AM

## 2012-06-09 NOTE — Evaluation (Addendum)
Occupational Therapy Evaluation Patient Details Name: Jesus [name] MRN: [id number] DOB: [birth year] Today's Date: 06/09/2012 Time: 7829-5621 OT Time Calculation (min): 19 min  OT Assessment / Plan / Recommendation Clinical Impression  66 yo male admitted for scrotal edema and fluid overload. Pt progressing well with therapy and will improve as swelling and gout pain decrease. OT to follow acutely.     OT Assessment  Patient needs continued OT Services    Follow Up Recommendations  Home health OT    Barriers to Discharge      Equipment Recommendations  None recommended by OT    Recommendations for Other Services    Frequency  Min 2X/week    Precautions / Restrictions Precautions Precautions: Fall   Pertinent Vitals/Pain Monitored and stable Pt has scrotal sling present in room however still too swollen to don at this time. OT to address sling management when appropriate and swelling under control.     ADL  Eating/Feeding: Performed;Modified independent Where Assessed - Eating/Feeding: Chair Grooming: Performed;Teeth care;Min guard Where Assessed - Grooming: Unsupported standing Lower Body Bathing: Performed;Moderate assistance Where Assessed - Lower Body Bathing: Supported sitting Upper Body Dressing: Performed;Supervision/safety Where Assessed - Upper Body Dressing: Supported sitting Equipment Used: Rolling walker ADL Comments: Pt completing PT evaluation and standing at sink level for OT to  begin OT evaluation. Pt completed oral care standing. pt completed UB bathing arm pits. Pt requesting to sit to perform peri hygiene. pt with enlarged swollen scrotum. Pt educated on redness at buttock and pressure relief in sitting position. pt is able to complete weight shiting independently. Pt educated on using towels to get elevation at scrotum to decrease edema and decrease pain. Pt experiencing gout in LEs and reports Lt > Rt. Pt was able to apply lotion and barrier cream to  scrotum to decrease risk of skin break down. Pt with uncontrollable urgency to void bladder. (pt has sling for scrotum present in room )    OT Diagnosis: Generalized weakness;Acute pain  OT Problem List: Decreased activity tolerance;Impaired balance (sitting and/or standing);Decreased knowledge of use of DME or AE;Decreased knowledge of precautions;Pain OT Treatment Interventions: Self-care/ADL training;DME and/or AE instruction;Therapeutic activities;Patient/family education;Balance training   OT Goals Acute Rehab OT Goals OT Goal Formulation: With patient Time For Goal Achievement: 06/23/12 Potential to Achieve Goals: Good ADL Goals Pt Will Perform Upper Body Bathing: with modified independence;Standing at sink ADL Goal: Upper Body Bathing - Progress: Goal set today Pt Will Perform Lower Body Bathing: with modified independence;Standing at sink;with adaptive equipment ADL Goal: Lower Body Bathing - Progress: Goal set today Pt Will Perform Upper Body Dressing: with modified independence;Sit to stand from chair;Sit to stand from bed ADL Goal: Upper Body Dressing - Progress: Goal set today Pt Will Perform Lower Body Dressing: with modified independence;Sit to stand from chair;Sit to stand from bed;with adaptive equipment ADL Goal: Lower Body Dressing - Progress: Goal set today Pt Will Transfer to Toilet: with modified independence;3-in-1;Ambulation ADL Goal: Toilet Transfer - Progress: Goal set today Pt Will Perform Toileting - Clothing Manipulation: with modified independence;Sitting on 3-in-1 or toilet ADL Goal: Toileting - Clothing Manipulation - Progress: Goal set today Pt Will Perform Toileting - Hygiene: with modified independence;Sit to stand from 3-in-1/toilet ADL Goal: Toileting - Hygiene - Progress: Goal set today Miscellaneous OT Goals Miscellaneous OT Goal #1: Pt will don /doff sling to help with scrotal edema mod I as precursor to d/c home. OT Goal: Miscellaneous Goal #1 -  Progress: Goal set today  Visit Information  Last OT Received On: 06/09/12 Assistance Needed: +1    Subjective Data  Subjective: "this whole experience has been a bit humiliating"- pt expressing experience with swollen scrotum Patient Stated Goal: pt get back to being able to get around   Prior Functioning  Home Living Lives With: Alone Available Help at Discharge: Family;Available PRN/intermittently Type of Home: House Home Access: Stairs to enter [employer] of Steps: 1 Home Layout: One level Bathroom Shower/Tub: Engineer, manufacturing systems: Standard Home Adaptive Equipment: Straight cane Prior Function Level of Independence: Independent with assistive device(s) Able to Take Stairs?: Yes Driving: Yes Vocation: Retired Musician: No difficulties Dominant Hand: Right    Cognition  Overall Cognitive Status: Appears within functional limits for tasks assessed/performed Arousal/Alertness: Awake/alert Orientation Level: Oriented X4 / Intact Behavior During Session: WFL for tasks performed    Extremity/Trunk Assessment Right Upper Extremity Assessment RUE ROM/Strength/Tone: Within functional levels RUE Coordination: WFL - gross/fine motor Left Upper Extremity Assessment LUE ROM/Strength/Tone: Within functional levels LUE Coordination: WFL - gross/fine motor Trunk Assessment Trunk Assessment: Normal   Mobility Transfers Transfers: Stand to Sit Stand to Sit: 4: Min guard;To chair/3-in-1;With upper extremity assist Details for Transfer Assistance: pt sequencing task by saying the steps out loud to verify with OT that sequence is correct. Pt able to correctly position RW against chair reach back with BIL UE and lower body into chair with controlled descend.   Exercise    Balance Balance Balance Assessed: Yes Static Standing Balance Static Standing - Level of Assistance: 4: Min assist Static Standing - Comment/# of Minutes: 7  End of  Session OT - End of Session Activity Tolerance: Patient tolerated treatment well Patient left: in chair;with call bell/phone within reach Nurse Communication: Mobility status   Discussed with pt the risk of UTI currently with voiding into towels in chair. Need for frequent hygiene and skin break down risk. Pt demonstrated understanding and completed LB hygiene during session.    Harrel Carina [facility] 06/09/2012, 8:52 AM Pager: [phone]

## 2012-06-09 NOTE — Progress Notes (Signed)
Patient ID: Jesus [name], male   DOB: 05-03-1946, 66 y.o.   MRN: [id number]     SUBJECTIVE: Good diuresis yesterday with metolazone.  Scrotal swelling coming down.  Getting colchicine for gout in knee, feeling a bit better. Walked a little yesterday.    . barrier cream  1 application Topical Q12H  . carvedilol  25 mg Oral BID WC  . cloNIDine  0.3 mg Oral BID  . colchicine  0.6 mg Oral BID  . enoxaparin (LOVENOX) injection  40 mg Subcutaneous Q24H  . hydrocerin   Topical Daily  . lisinopril  20 mg Oral BID  . magnesium oxide  400 mg Oral Daily  . metolazone  2.5 mg Oral Once  . potassium chloride  20 mEq Oral Once  . potassium chloride SA  40 mEq Oral TID  . potassium chloride  40 mEq Oral Once  . sodium chloride  3 mL Intravenous Q12H  . spironolactone  25 mg Oral Daily  . torsemide  60 mg Oral Daily  . warfarin  15 mg Oral ONCE-1800  . Warfarin - Pharmacist Dosing Inpatient   Does not apply q1800  . DISCONTD: furosemide  80 mg Intravenous Q8H      Filed Vitals:   06/08/12 1300 06/08/12 2100 06/09/12 0500 06/09/12 0800  BP: 126/70 137/79 139/85   Pulse: 62 63 67   Temp: 98.2 F (36.8 C) 99.5 F (37.5 C) 98.5 F (36.9 C)   TempSrc:  Oral Oral   Resp: 20 18 18    Height:      Weight:    133.04 kg (293 lb 4.8 oz)  SpO2: 94% 93% 94%    No intake or output data in the 24 hours ending 06/09/12 0852  LABS: Basic Metabolic Panel:  Basename 06/09/12 0500 06/08/12 0540  NA 133* 135  K 3.7 3.7  CL 84* 87*  CO2 36* 36*  GLUCOSE 126* 119*  BUN 37* 28*  CREATININE 1.03 0.90  CALCIUM 10.4 10.1  MG -- --  PHOS -- --   Liver Function Tests:  Basename 06/07/12 0630  AST 14  ALT 11  ALKPHOS 154*  BILITOT 0.9  PROT 7.0  ALBUMIN 3.3*   No results found for this basename: LIPASE:2,AMYLASE:2 in the last 72 hours CBC:  Basename 06/09/12 0500  WBC 7.1  NEUTROABS --  HGB 13.8  HCT 41.6  MCV 90.2  PLT 339    RADIOLOGY: No results found.  PHYSICAL  EXAM General: NAD Neck: JVP 7 cm, no thyromegaly or thyroid nodule.  Lungs: Clear to auscultation bilaterally with normal respiratory effort. CV: Nondisplaced PMI.  Heart irregular S1/S2, no S3/S4, no murmur.  1+ chronic edema 1/3 to knees bilateral.  Scrotal edema improved.  No carotid bruit.  Abdomen: Soft, nontender, no hepatosplenomegaly, no distention.  Neurologic: Alert and oriented x 3.  Psych: Normal affect. Extremities: No clubbing or cyanosis.  MSK: Right knee warm  TELEMETRY: Reviewed telemetry pt in atrial fibrillation.    ASSESSMENT AND PLAN:  66 yo with history of chronic atrial fibrillation and diastolic CHF was admitted with volume overload.  1. Atrial fibrillation: Continue coumadin, don't have to overlap with therapeutic Lovenox.  Good rate control with Coreg.   2. Acute on chronic diastolic CHF: Volume much improved, 42 lbs down from admission.  BUN higher today.  Change to po torsemide and stop IV Lasix.  No more metolazone.  3. HTN: Now well-controlled.  4. Knee pain: Concern for gout in right knee.  Uric acid high.  Doing better with bid colchicine. 5. Dispo: Possible discharge Thursday.  Needs to walk today.  Will need home health/PT and coumadin followup.   I will need to see him in the office in 1 week.   Jesus [name] 06/09/2012 8:52 AM

## 2012-06-10 LAB — BASIC METABOLIC PANEL
BUN: 49 mg/dL — ABNORMAL HIGH (ref 6–23)
Calcium: 10.3 mg/dL (ref 8.4–10.5)
Creatinine, Ser: 1.28 mg/dL (ref 0.50–1.35)
GFR calc Af Amer: 66 mL/min — ABNORMAL LOW (ref >90)
GFR calc non Af Amer: 57 mL/min — ABNORMAL LOW (ref >90)

## 2012-06-10 LAB — PROTIME-INR
INR: 2.15 — ABNORMAL HIGH (ref 0.00–1.49)
Prothrombin Time: 24.4 seconds — ABNORMAL HIGH (ref 11.6–15.2)

## 2012-06-10 MED ORDER — WARFARIN SODIUM 7.5 MG PO TABS
15.0000 mg | ORAL_TABLET | Freq: Once | ORAL | Status: AC
  Administered 2012-06-10: 15 mg via ORAL
  Filled 2012-06-10: qty 2

## 2012-06-10 NOTE — Progress Notes (Signed)
ANTICOAGULATION CONSULT NOTE - Follow Up Consult  Pharmacy Consult for coumadin Indication: atrial fibrillation  Allergies  Allergen Reactions  . Amlodipine Besylate Swelling    "started off low and ended up severe; if, in fact, that is what's causing the swelling" (06/03/12)   Assessment: 66 yo who was admitted for CHF. He has chronic Atrial Fibrillation for which he has been on chronic Warfarin therapy - although admitting INR was only 1.2.  I discussed his therapy with him and told him he would likely need a higher dose at discharge.  INR= 2.15 today and within the desired goal range, CBC is stable and he has no s/s of bleeding.      (He will definitely need a higher discharge dose when he goes home).   Dose summary as follows:    6/26 Warfarin 5 mg  6/27   Warfarin 10mg   6/28   Warfarin 10mg   6/29   Warfarin 12.5mg   6/30 Warfarin 12.5mg   7/01 Warfarin 12.5mg   7/02 Warfarin 15 mg  7/03 Warfarin 15 mg    Home Dose:  Listed as Warfarin 5mg  daily - but was sub-therapeutic on admit with INR = 1.23  Goal of Therapy:  INR 2-3 Monitor platelets by anticoagulation protocol: Yes   Plan:   Will give Warfarin 15 mg again today and see if he continues to trend up.  It would be nice if we could avoid an alternating regimen due to compliance issues.  Monitor CBC for bleeding complications  Monitor AM PT/INR  Patient Measurements: Height: 5\' 9"  (175.3 cm) Weight: 293 lb 4.8 oz (133.04 kg) IBW/kg (Calculated) : 70.7   Vital Signs: Temp: 98.5 F (36.9 C) (07/04 0500) Temp src: Oral (07/04 0500) BP: 126/76 mmHg (07/04 1014) Pulse Rate: 69  (07/04 0500)  Labs:  Basename 06/10/12 0608 06/09/12 0500 06/08/12 0540  HGB -- 13.8 --  HCT -- 41.6 --  PLT -- 339 --  APTT -- -- --  LABPROT 24.4* 21.2* 21.7*  INR 2.15* 1.80* 1.85*  HEPARINUNFRC -- -- --  CREATININE 1.28 1.03 0.90  CKTOTAL -- -- --  CKMB -- -- --  TROPONINI -- -- --   Estimated Creatinine Clearance: 76.8 ml/min  (by C-G formula based on Cr of 1.28).  Medications:  Scheduled:     . barrier cream  1 application Topical Q12H  . carvedilol  25 mg Oral BID WC  . cloNIDine  0.2 mg Oral BID  . enoxaparin (LOVENOX) injection  40 mg Subcutaneous Q24H  . furosemide  80 mg Intravenous Q8H  . lisinopril  20 mg Oral BID  . magnesium oxide  400 mg Oral Daily  . potassium chloride SA  40 mEq Oral TID  . sodium chloride  3 mL Intravenous Q12H  . spironolactone  25 mg Oral Daily  . warfarin  12.5 mg Oral ONCE-1800  . Warfarin - Pharmacist Dosing Inpatient   Does not apply [id number]   Nadara Mustard, PharmD., MS Clinical Pharmacist Pager:  [phone]  Thank you for allowing pharmacy to be part of this patients care team.  06/10/2012 10:33 AM

## 2012-06-10 NOTE — Progress Notes (Signed)
Patient ID: Jesus [name], male   DOB: 02-15-1946, 66 y.o.   MRN: [id number]   SUBJECTIVE: Patient is feeling better. He still has some scrotal edema. There is still some peripheral edema. Unfortunately measurement of his I&O is very incomplete. Be continued as significant weight loss. He's lost 43 pounds.   Filed Vitals:   06/09/12 0800 06/09/12 1400 06/09/12 2100 06/10/12 0500  BP:  147/77 95/60 153/85  Pulse:  69 61 69  Temp:  98.6 F (37 C) 98.4 F (36.9 C) 98.5 F (36.9 C)  TempSrc:  Oral Oral Oral  Resp:  18 17 17   Height:      Weight: 293 lb 4.8 oz (133.04 kg)     SpO2:  96% 96% 93%    Intake/Output Summary (Last 24 hours) at 06/10/12 0840 Last data filed at 06/09/12 2300  Gross per 24 hour  Intake      0 ml  Output      1 ml  Net     -1 ml    LABS: Basic Metabolic Panel:  Basename 06/10/12 0608 06/09/12 0500  NA 133* 133*  K 4.2 3.7  CL 84* 84*  CO2 34* 36*  GLUCOSE 123* 126*  BUN 49* 37*  CREATININE 1.28 1.03  CALCIUM 10.3 10.4  MG -- --  PHOS -- --   Liver Function Tests: No results found for this basename: AST:2,ALT:2,ALKPHOS:2,BILITOT:2,PROT:2,ALBUMIN:2 in the last 72 hours No results found for this basename: LIPASE:2,AMYLASE:2 in the last 72 hours CBC:  Basename 06/09/12 0500  WBC 7.1  NEUTROABS --  HGB 13.8  HCT 41.6  MCV 90.2  PLT 339   Cardiac Enzymes: No results found for this basename: CKTOTAL:3,CKMB:3,CKMBINDEX:3,TROPONINI:3 in the last 72 hours BNP: No components found with this basename: POCBNP:3 D-Dimer: No results found for this basename: DDIMER:2 in the last 72 hours Hemoglobin A1C: No results found for this basename: HGBA1C in the last 72 hours Fasting Lipid Panel: No results found for this basename: CHOL,HDL,LDLCALC,TRIG,CHOLHDL,LDLDIRECT in the last 72 hours Thyroid Function Tests: No results found for this basename: TSH,T4TOTAL,FREET3,T3FREE,THYROIDAB in the last 72 hours  RADIOLOGY: No results found.  PHYSICAL EXAM    Patient is oriented to person time and place. Affect is normal. He's sitting in a chair. Lungs reveal scattered rales. Cardiac exam reveals S1 and S2. There no clicks or significant murmurs. He still has 1+ edema in his legs. There is significant venous stasis change. He still has scrotal edema although it is markedly improved.   TELEMETRY: I have reviewed telemetry June 10, 2012, there is atrial fibrillation with controlled rate.   ASSESSMENT AND PLAN:   *Acute on chronic diastolic CHF (congestive heart failure)     There is continued improvement. I feel the patient is not ready for discharge today.   Atrial fibrillation   Atrial fibrillation rate is controlled.   Hypertension   Systolic pressure is mildly elevated. No change in medicine today.   Renal insufficiency   There is slight increase in BUN and creatinine today. We will continue to follow this.   Willa Rough 06/10/2012 8:40 AM

## 2012-06-11 ENCOUNTER — Encounter (HOSPITAL_COMMUNITY): Payer: Self-pay | Admitting: Physician Assistant

## 2012-06-11 LAB — BASIC METABOLIC PANEL
BUN: 54 mg/dL — ABNORMAL HIGH (ref 6–23)
Calcium: 10.6 mg/dL — ABNORMAL HIGH (ref 8.4–10.5)
Creatinine, Ser: 1.24 mg/dL (ref 0.50–1.35)
GFR calc non Af Amer: 59 mL/min — ABNORMAL LOW (ref >90)
Glucose, Bld: 122 mg/dL — ABNORMAL HIGH (ref 70–99)
Potassium: 4.4 mEq/L (ref 3.5–5.1)

## 2012-06-11 MED ORDER — COLCHICINE 0.6 MG PO TABS: 0.6000 mg | ORAL_TABLET | Freq: Every day | ORAL | Status: DC

## 2012-06-11 MED ORDER — MAGNESIUM OXIDE 400 (241.3 MG) MG PO TABS: 400.0000 mg | ORAL_TABLET | Freq: Every day | ORAL | Status: AC

## 2012-06-11 MED ORDER — MAGNESIUM OXIDE 400 (241.3 MG) MG PO TABS: 400.0000 mg | ORAL_TABLET | Freq: Every day | ORAL | Status: DC

## 2012-06-11 MED ORDER — WARFARIN SODIUM 5 MG PO TABS: 5.0000 mg | ORAL_TABLET | ORAL | Status: DC

## 2012-06-11 MED ORDER — LISINOPRIL 40 MG PO TABS: 20.0000 mg | ORAL_TABLET | Freq: Two times a day (BID) | ORAL | Status: DC

## 2012-06-11 MED ORDER — CLONIDINE HCL 0.2 MG PO TABS
0.4000 mg | ORAL_TABLET | Freq: Two times a day (BID) | ORAL | Status: DC
  Administered 2012-06-11: 0.4 mg via ORAL
  Filled 2012-06-11 (×2): qty 2

## 2012-06-11 MED ORDER — POTASSIUM CHLORIDE CRYS ER 20 MEQ PO TBCR
40.0000 meq | EXTENDED_RELEASE_TABLET | Freq: Two times a day (BID) | ORAL | Status: DC
  Administered 2012-06-11: 40 meq via ORAL

## 2012-06-11 MED ORDER — WARFARIN SODIUM 2.5 MG PO TABS
12.5000 mg | ORAL_TABLET | Freq: Once | ORAL | Status: DC
  Filled 2012-06-11: qty 1

## 2012-06-11 MED ORDER — TORSEMIDE 20 MG PO TABS: 60.0000 mg | ORAL_TABLET | Freq: Every day | ORAL | Status: DC

## 2012-06-11 MED ORDER — CLONIDINE HCL 0.2 MG PO TABS: 0.4000 mg | ORAL_TABLET | Freq: Two times a day (BID) | ORAL | Status: DC

## 2012-06-11 MED ORDER — SPIRONOLACTONE 25 MG PO TABS: 25.0000 mg | ORAL_TABLET | Freq: Every day | ORAL | Status: DC

## 2012-06-11 MED ORDER — POTASSIUM CHLORIDE CRYS ER 20 MEQ PO TBCR: 40.0000 meq | EXTENDED_RELEASE_TABLET | Freq: Every day | ORAL | Status: DC

## 2012-06-11 NOTE — Progress Notes (Signed)
Patient ID: Jesus [name], male   DOB: 1946/04/07, 66 y.o.   MRN: [id number]     SUBJECTIVE: On po diuretic now, weight stable.  Able to walk down hall with PT this morning.  Knee pain is better.    . barrier cream  1 application Topical Q12H  . carvedilol  25 mg Oral BID WC  . cloNIDine  0.4 mg Oral BID  . colchicine  0.6 mg Oral BID  . hydrocerin   Topical Daily  . lisinopril  20 mg Oral BID  . magnesium oxide  400 mg Oral Daily  . potassium chloride SA  40 mEq Oral TID  . sodium chloride  3 mL Intravenous Q12H  . spironolactone  25 mg Oral Daily  . torsemide  60 mg Oral Daily  . warfarin  15 mg Oral ONCE-1800  . Warfarin - Pharmacist Dosing Inpatient   Does not apply q1800  . DISCONTD: cloNIDine  0.3 mg Oral BID  . DISCONTD: enoxaparin (LOVENOX) injection  40 mg Subcutaneous Q24H      Filed Vitals:   06/10/12 1400 06/10/12 2116 06/11/12 0631 06/11/12 0638  BP: 148/84 143/44 170/98 132/68  Pulse: 66 76 61   Temp: 97.8 F (36.6 C) 98.2 F (36.8 C) 97.6 F (36.4 C)   TempSrc: Oral Oral Oral   Resp: 17 20 20    Height:      Weight:   133.358 kg (294 lb)   SpO2: 97% 96% 96%     Intake/Output Summary (Last 24 hours) at 06/11/12 0746 Last data filed at 06/10/12 0900  Gross per 24 hour  Intake    240 ml  Output      0 ml  Net    240 ml    LABS: Basic Metabolic Panel:  Basename 06/10/12 0608 06/09/12 0500  NA 133* 133*  K 4.2 3.7  CL 84* 84*  CO2 34* 36*  GLUCOSE 123* 126*  BUN 49* 37*  CREATININE 1.28 1.03  CALCIUM 10.3 10.4  MG -- --  PHOS -- --   Liver Function Tests: No results found for this basename: AST:2,ALT:2,ALKPHOS:2,BILITOT:2,PROT:2,ALBUMIN:2 in the last 72 hours No results found for this basename: LIPASE:2,AMYLASE:2 in the last 72 hours CBC:  Basename 06/09/12 0500  WBC 7.1  NEUTROABS --  HGB 13.8  HCT 41.6  MCV 90.2  PLT 339    RADIOLOGY: No results found.  PHYSICAL EXAM General: NAD Neck: JVP 7 cm, no thyromegaly or thyroid  nodule.  Lungs: Clear to auscultation bilaterally with normal respiratory effort. CV: Nondisplaced PMI.  Heart irregular S1/S2, no S3/S4, no murmur.  1+ chronic edema 1/3 to knees bilateral.  Scrotal edema improved.  No carotid bruit.  Abdomen: Soft, nontender, no hepatosplenomegaly, no distention.  Neurologic: Alert and oriented x 3.  Psych: Normal affect. Extremities: No clubbing or cyanosis.  MSK: Right knee less warm  TELEMETRY: Reviewed telemetry pt in atrial fibrillation.    ASSESSMENT AND PLAN:  66 yo with history of chronic atrial fibrillation and diastolic CHF was admitted with volume overload.  1. Atrial fibrillation: Continue coumadin, INR now therapeutic.  2. Acute on chronic diastolic CHF: Volume much improved, 42 lbs down from admission.  He is now on po torsemide with stable weight for the last couple days. 3. HTN: Better.  Increase clonidine to 0.4 mg bid.  Avoid CCBs with worsening edema.  4. Knee pain: Concern for gout in right knee.  Uric acid high.  Doing better with colchicine. 5. Dispo:  Can be discharged today.  Will need home health and PT.  Will need to see me in the office next week, BMET next week, and will need INR at coumadin clinic (can overbook to see me).  Meds: torsemide 60 daily, Coreg 25 bid, clonidine 0.4 bid, colchicine 0.6 daily, lisinopril 20 bid, KCl 40 daily, spironolactone 25 daily, warfarin.  [name] 06/11/2012 7:46 AM

## 2012-06-11 NOTE — Discharge Summary (Signed)
Discharge Summary   Patient ID: Jesus [name] MRN: [id number], DOB/AGE: September 27, 1946 66 y.o. Admit date: 06/02/2012 D/C date:     06/11/2012   Primary Discharge Diagnoses:  1. Acute on chronic diastolic CHF - diuresed 42lbs on IV Lasix/po metolazone - EF 50-55% by echo 04/2012 - concern noted on that echo for ?infiltrative CM but immunofixation 05/12/12 normal per Dr. Alford Highland notes 2. Atrial fibrillation - first noted in 8/12, probably persistent 3. HTN, uncontrolled - concern for orsened edema with CCB so these are avoided in this patient 4. Knee pain possibly secondary to gout 5. Morbid obesity BMI 43.4 6. Hyperglycemia (possible diabetes mellitus), for f/u with PCP  Secondary Discharge Diagnoses:  1. Chronic LEE 2. Osteoarthritis 3. H/o GI bleed reported in chart 4. Chronic venous stasis ulcers  Hospital Course: Jesus [name] is a 66 y/o M with a hx of CHF, HTN, atrial fibrillation who presented to [facility] with complaints of abdominal swelling and scotal edema. He was seen in the office earlier in June by Dr. Shirlee Latch and Lasix/K were increased. However, he continued to feel as though he was retaining more fluid. He reported compliance with medications and denied any recent salt load. Admission for 06/02/12 was arranged for him as an outpatient for diuresis. He was placed on IV Lasix and supplemental potassium. The patient's INR was subtherapeutic at 1.23 on admission and he was initially started on Lovenox for bridging but this was stopped and he was just placed on Coumadin (note in Dr. Alford Highland 05/2010 note, the patient did not follow up previously as an OP due to transportation problems for INR checks and could not afford the newer agents). Spironolactone was added for uncontrolled HTN. Clonidine and diltiazem were subsequently added, but there was some concern that calcium channel blockers were contributing to his edema so diltiazem was stopped. He was noted to have nocturnal  bradycardia but was asymptomatic and this was while sleeping. On 06/07/12 he was diuresing but still had significant scrotal edema requiring him to urinate into a towel, thus metolazone was added to his regimen which did help. He also reported knee pain and Dr. Shirlee Latch was concerned for gout and thus started colchicine (uric acid was high) which improved his pain. On 06/09/12, IV lasix was stopped and changed to PO torsemide. There was slight bump in BUN/Cr and this was monitored, d/c Cr is 1.54. He was extensively counselled regarding Coumadin compliance. He is overall down 42lbs from admission. He is feeling better and has ambulated in the hall. Dr. Shirlee Latch has seen and examined him today and feels he is stable for discharge. Per pharmacy recommendations, due to high dosing required in hospital of Coumadin, he will likely need an alternating dose of Coumadin 15mg  and 12.5mg  to maintain target range.   Discharge Vitals: Blood pressure 132/68, pulse 61, temperature 97.6 F (36.4 C), temperature source Oral, resp. rate 20, height 5\' 9"  (1.753 m), weight 294 lb (133.358 kg), SpO2 96.00%.  Labs: Lab Results  Component Value Date   WBC 7.1 06/09/2012   HGB 13.8 06/09/2012   HCT 41.6 06/09/2012   MCV 90.2 06/09/2012   PLT 339 06/09/2012     Lab 06/11/12 0615 06/07/12 0630  NA 132* --  K 4.4 --  CL 84* --  CO2 33* --  BUN 54* --  CREATININE 1.24 --  CALCIUM 10.6* --  PROT -- 7.0  BILITOT -- 0.9  ALKPHOS -- 154*  ALT -- 11  AST -- 14  GLUCOSE 122* --     Diagnostic Studies/Procedures   1. Prior echo 05/07/12 - 2D Echo 05/07/12 Study Conclusions - Left ventricle: The cavity size was normal. Wall thickness was increased in a pattern of severe LVH. Systolic function was normal. The estimated ejection fraction was in the range of 50% to 55%. Wall motion was normal; there were no regional wall motion abnormalities. - Mitral valve: Calcified annulus. - Left atrium: The atrium was moderately dilated. - Right  ventricle: The cavity size was mildly dilated. - Right atrium: The atrium was mildly dilated. Impressions: - Technically difficult; severe LVH noted and biatrial enlargement; consider infiltrative cardiomyopathy such as amyloid. F/u immunofixation results 05/12/12 - normal  Discharge Medications   Medication List  As of 06/11/2012  9:09 AM   STOP taking these medications         furosemide 40 MG tablet      metoprolol 100 MG tablet         TAKE these medications         carvedilol 25 MG tablet   Commonly known as: COREG   Take 25 mg by mouth 2 (two) times daily with a meal.      cloNIDine 0.2 MG tablet   Commonly known as: CATAPRES   Take 2 tablets (0.4 mg total) by mouth 2 (two) times daily.      colchicine 0.6 MG tablet   Take 1 tablet (0.6 mg total) by mouth daily.      lisinopril 40 MG tablet   Commonly known as: PRINIVIL,ZESTRIL   Take 0.5 tablets (20 mg total) by mouth 2 (two) times daily.      magnesium oxide 400 (241.3 MG) MG tablet   Commonly known as: MAG-OX   Take 1 tablet (400 mg total) by mouth daily.      potassium chloride SA 20 MEQ tablet   Commonly known as: K-DUR,KLOR-CON   Take 2 tablets (40 mEq total) by mouth daily.      spironolactone 25 MG tablet   Commonly known as: ALDACTONE   Take 1 tablet (25 mg total) by mouth daily.      torsemide 20 MG tablet   Commonly known as: DEMADEX   Take 3 tablets (60 mg total) by mouth daily.      warfarin 5 MG tablet   Commonly known as: COUMADIN   Take 1 tablet (5 mg total) by mouth as directed. FOR NOW, YOU WILL ALTERNATE 2.5 TABLETS (12.5mg ) AND 3 TABLETS (15mg ) EVERY OTHER DAY. Take 2 and a half tablets on Friday 06/11/12. Take 3 tablets on Saturday 06/12/12. Take 2 and a half tablets on Sunday 06/13/12. On Monday, please have home health draw your blood for your Coumadin level check and they will tell you what your next dose should be.           Note that the patient will go home with [facility], PT, OT. Initially  Home health Aide was selected by Dr. Shirlee Latch but this was addended by Dr. Myrtis Ser to choose [facility] so that the patient could have lab draws at home to help encourage compliance (especially with Coumadin).  Disposition   The patient will be discharged in stable condition to home. Discharge Orders    Future Appointments: Provider: Department: Dept Phone: Center:   06/18/2012 11:30 AM Laurey Morale, MD [facility] [phone] [id number]     Future Orders Please Complete By Expires   Diet - low sodium heart healthy  Increase activity slowly      Discharge instructions      Comments:   Please review the special instructions for heart failure patients at the bottom of your discharge after-visit summary paperwork.     Follow-up Information    Follow up with Primary Care Doctor. (Your blood sugars were elevated in the hospital indicating that you may be at risk for diabetes. Please see your primary care doctor  within 1 week to discuss this diagnosis. They will also need to follow your gout.)       Follow up with [name], MD. (July 12th, 2013 at 11:30AM )    Contact information:   [address] [place] [address] [phone]       Follow up with Home Health. (Please have home health draw your INR ("Coumadin level") on Monday 06/14/12, results to [facility]. Please have them draw a BMET and Magnesium level on Thursday 06/17/12, results to Dr. Alford Highland office. )            Duration of Discharge Encounter: Greater than 30 minutes including physician and PA time.  Signed, Trystyn Dolley [name] 06/11/2012, 9:09 AM

## 2012-06-11 NOTE — Progress Notes (Signed)
   CARE MANAGEMENT NOTE 06/11/2012  Patient:  Jesus [name], Jesus [name]   Account Number:  [id number]  Date Initiated:  06/02/2012  Documentation initiated by:  Donn Pierini  Subjective/Objective Assessment:   Pt admitted with CHF     Action/Plan:   PTA pt lived at home  Met with pt re HH needs and d/c plans, pt selected [facility] for [facility] and will need to order DME from [place] due to insurance. Pt does not wish to order 3:1 now, Rolling walker only.   Anticipated DC Date:  06/11/2012   Anticipated DC Plan:  HOME W HOME HEALTH SERVICES      DC Planning Services  CM consult      PAC Choice  DURABLE MEDICAL EQUIPMENT  HOME HEALTH   Choice offered to / List presented to:  C-1 Patient   DME arranged  WALKER - ROLLING      DME agency  [facility]     HH arranged  HH-1 RN  HH-2 PT  HH-3 OT      [facility] agency  [facility]   Status of service:  Completed, signed off Medicare Important Message given?   (If response is "NO", the following Medicare IM given date fields will be blank) Date Medicare IM given:   Date Additional Medicare IM given:    Discharge Disposition:  HOME W HOME HEALTH SERVICES  Per UR Regulation:  Reviewed for med. necessity/level of care/duration of stay  If discussed at Long Length of Stay Meetings, dates discussed:   06/09/2012    Comments:  PCP- Windle Guard OLIVER 06/11/2012 CM met with pt who was not clear on cost of HH, this CM explained insurance coverage of HH and DME and pt was agreeabe [facility] and [employer] for rolling walker. Johny Shock RN MPH case Manager [phone]  06-09-12 [address], [place] [phone] CSW did offer choice to pt and he was not ready to choose [facility] at this time. CM will continue to monitor for disposition needs.  06-09-12 Pt was discussed in Long Length of stay meeting today.  06-07-12 1440 Tomi Bamberger, RN, BSN [phone] Pt will benefit from PT/OT consult. CM will continue to f/u for  disposition needs.   06/03/12- 1530- Donn Pierini RN, BSN [phone] Spoke with pt at bedside regarding potential d/c needs. Per conversation pt lives alone, has sister [name]) and close friend [name])  to assist. Pt uses cane at times. Pt states that he goes to Dr. Jeannetta Nap for is yearly physical, but is also followed by Dr. Waldron Labs for his compression wraps on bil. LE. Pt uses local pharm. for medications ([facility]) and has medication coverage. Spoke to pt about possible HH-RN to follow him at discharge- pt states that he does not feel like he will need HH services- and declines at this time. He does say however that if something changes he will be open to discussion. NCM to continue to follow.  06/02/12- 1345- Donn Pierini RN, BSN [phone] UR completed,

## 2012-06-11 NOTE — Progress Notes (Signed)
Reviewed discharge instructions with patient and family, they stated their understanding.  Reinforced daily weights and when to call doctor.  Also discussed following up with primary care physician regarding blood sugars.  Patient awaiting rolling walker delivery before discharge home.  Colman Cater

## 2012-06-11 NOTE — Progress Notes (Signed)
Physical Therapy Treatment Patient Details Name: Jesus [name] MRN: [id number] DOB: 1946-06-11 Today's Date: 06/11/2012 Time: 3086-5784 PT Time Calculation (min): 20 min  PT Assessment / Plan / Recommendation Comments on Treatment Session  Patient with improved functional mobility. I would recommend home health PT but patient would like to know equipment costs etc before he agrees.     Follow Up Recommendations  Home health PT;Supervision - Intermittent    Barriers to Discharge  None      Equipment Recommendations  Rolling walker with 5" wheels;3 in 1 bedside comode    Recommendations for Other Services  None  Frequency Min 3X/week   Plan Discharge plan remains appropriate;Frequency remains appropriate    Precautions / Restrictions Precautions Precautions: Fall   Pertinent Vitals/Pain VSS/ Pain of left posterior knee, but decreased since earlier in the week.    Mobility  Transfers Sit to Stand: 5: Supervision;With upper extremity assist;From chair/3-in-1 Stand to Sit: 5: Supervision;With upper extremity assist;To chair/3-in-1 Details for Transfer Assistance: Reviewed correct hand placement and foot positioning for improved initiation of stand. Improved knee flexion secondary to decr. pain and decreased scrotal swelling allowing patient to place feet closer together and underneath self.  Ambulation/Gait Ambulation/Gait Assistance: 5: Supervision Ambulation Distance (Feet): 75 Feet Assistive device: Rolling walker Ambulation/Gait Assistance Details: Improved speed and efficiency with increased distance secondary to increased stride length.  Gait Pattern: Step-through pattern;Decreased stride length;Decreased stance time - left    Exercises Other Exercises Other Exercises: Reviewed ankle pumps and seated knee flexion and extension exercises. Patient has been performing by himself as well as massaging his knee. Technique is correct.    PT Goals Acute Rehab PT Goals PT Goal:  Sit to Stand - Progress: Progressing toward goal PT Goal: Stand to Sit - Progress: Progressing toward goal PT Goal: Ambulate - Progress: Progressing toward goal  Visit Information  Last PT Received On: 06/11/12 Assistance Needed: +1    Subjective Data  Subjective: Patient reports knee much improved - only pain is at the posterior knee with tenderness of the hamstring tendon area. Patient Stated Goal: Home, but would like to know cost of needed therapy and equipment etc.    Cognition  Overall Cognitive Status: Appears within functional limits for tasks assessed/performed Arousal/Alertness: Awake/alert Orientation Level: Appears intact for tasks assessed Behavior During Session: [facility] for tasks performed    Balance  High Level Balance High Level Balance Comments: No evidence of imbalance with gait with walker  End of Session PT - End of Session Equipment Utilized During Treatment: Gait belt Activity Tolerance: Patient limited by pain Patient left: in chair;with call bell/phone within reach Nurse Communication: Mobility status   Edwyna Perfect, PT  Pager [phone]  06/11/2012, 7:51 AM

## 2012-06-11 NOTE — Progress Notes (Addendum)
ANTICOAGULATION CONSULT NOTE - Follow Up Consult  Pharmacy Consult for coumadin Indication: atrial fibrillation  Allergies  Allergen Reactions  . Amlodipine Besylate Swelling    "started off low and ended up severe; if, in fact, that is what's causing the swelling" (06/03/12)   Assessment: 66 yo who was admitted for CHF. He has chronic Atrial Fibrillation for which he has been on chronic Warfarin therapy - although admitting INR was only 1.2.  I discussed his therapy with him and told him he would likely need a higher dose at discharge.  INR= 2.45 today and within the desired goal range, CBC is stable and he has no s/s of bleeding.      (He will definitely need a higher discharge dose when he goes home).   Dose summary as follows:    6/26 Warfarin 5 mg  6/27   Warfarin 10mg   6/28   Warfarin 10mg   6/29   Warfarin 12.5mg   6/30 Warfarin 12.5mg   7/01 Warfarin 12.5mg   7/02 Warfarin 15 mg  7/03 Warfarin 15 mg  7/04 Warfarin 15 mg    Home Dose:  Listed as Warfarin 5mg  daily - but was sub-therapeutic on admit with INR = 1.23  Goal of Therapy:  INR 2-3 Monitor platelets by anticoagulation protocol: Yes   Plan:   He will likely need an alternating dose of Warfarin 15 mg and 12.5mg  to maintain target range.  Will give 12.5mg  Warfarin today if not discharged.  Monitor CBC for bleeding complications  Monitor AM PT/INR  Patient Measurements: Height: 5\' 9"  (175.3 cm) Weight: 294 lb (133.358 kg) IBW/kg (Calculated) : 70.7   Vital Signs: Temp: 97.6 F (36.4 C) (07/05 0631) Temp src: Oral (07/05 0631) BP: 132/68 mmHg (07/05 0638) Pulse Rate: 61  (07/05 0631)  Labs:  Basename 06/11/12 0615 06/10/12 0608 06/09/12 0500  HGB -- -- 13.8  HCT -- -- 41.6  PLT -- -- 339  APTT -- -- --  LABPROT 27.0* 24.4* 21.2*  INR 2.45* 2.15* 1.80*  HEPARINUNFRC -- -- --  CREATININE 1.24 1.28 1.03  CKTOTAL -- -- --  CKMB -- -- --  TROPONINI -- -- --   Estimated Creatinine Clearance: 79.4  ml/min (by C-G formula based on Cr of 1.24).  Medications:  Scheduled:     . barrier cream  1 application Topical Q12H  . carvedilol  25 mg Oral BID WC  . cloNIDine  0.2 mg Oral BID  . enoxaparin (LOVENOX) injection  40 mg Subcutaneous Q24H  . furosemide  80 mg Intravenous Q8H  . lisinopril  20 mg Oral BID  . magnesium oxide  400 mg Oral Daily  . potassium chloride SA  40 mEq Oral TID  . sodium chloride  3 mL Intravenous Q12H  . spironolactone  25 mg Oral Daily  . warfarin  12.5 mg Oral ONCE-1800  . Warfarin - Pharmacist Dosing Inpatient   Does not apply [id number]   Nadara Mustard, PharmD., MS Clinical Pharmacist Pager:  [phone]  Thank you for allowing pharmacy to be part of this patients care team.  06/11/2012 8:06 AM

## 2012-06-14 ENCOUNTER — Ambulatory Visit: Payer: Self-pay | Admitting: Cardiology

## 2012-06-14 ENCOUNTER — Telehealth: Payer: Self-pay | Admitting: Cardiology

## 2012-06-14 DIAGNOSIS — I4891 Unspecified atrial fibrillation: Secondary | ICD-10-CM

## 2012-06-14 DIAGNOSIS — I10 Essential (primary) hypertension: Secondary | ICD-10-CM

## 2012-06-14 LAB — POCT INR: INR: 4.7

## 2012-06-14 NOTE — Telephone Encounter (Signed)
Pt was discharged Friday and on his papers said that he needs INR on Monday and home health is to do it but he has not heard anything and he is not going to sit around all day waiting so he needs to know what to do

## 2012-06-17 ENCOUNTER — Encounter: Payer: Self-pay | Admitting: Cardiology

## 2012-06-17 ENCOUNTER — Ambulatory Visit: Payer: Self-pay | Admitting: Cardiology

## 2012-06-17 DIAGNOSIS — I10 Essential (primary) hypertension: Secondary | ICD-10-CM

## 2012-06-17 DIAGNOSIS — I4891 Unspecified atrial fibrillation: Secondary | ICD-10-CM

## 2012-06-17 LAB — POCT INR: INR: 2.6

## 2012-06-18 ENCOUNTER — Ambulatory Visit (INDEPENDENT_AMBULATORY_CARE_PROVIDER_SITE_OTHER): Payer: Medicare FFS | Admitting: Cardiology

## 2012-06-18 ENCOUNTER — Encounter: Payer: Self-pay | Admitting: Cardiology

## 2012-06-18 VITALS — BP 132/78 | HR 70 | Ht 69.0 in | Wt 301.0 lb

## 2012-06-18 DIAGNOSIS — I509 Heart failure, unspecified: Secondary | ICD-10-CM

## 2012-06-18 DIAGNOSIS — I10 Essential (primary) hypertension: Secondary | ICD-10-CM

## 2012-06-18 DIAGNOSIS — I4891 Unspecified atrial fibrillation: Secondary | ICD-10-CM

## 2012-06-18 LAB — BRAIN NATRIURETIC PEPTIDE: Pro B Natriuretic peptide (BNP): 319 pg/mL — ABNORMAL HIGH (ref 0.0–100.0)

## 2012-06-18 LAB — BASIC METABOLIC PANEL
Calcium: 9.9 mg/dL (ref 8.4–10.5)
Chloride: 89 mEq/L — ABNORMAL LOW (ref 96–112)
Creatinine, Ser: 1.2 mg/dL (ref 0.4–1.5)
GFR: 61.98 mL/min (ref >60.00)

## 2012-06-18 NOTE — Patient Instructions (Addendum)
Your physician recommends that you have  lab work today--BMET/BNP  .Your physician recommends that you schedule a follow-up appointment in: 3 weeks with Dr Shirlee Latch.

## 2012-06-20 NOTE — Assessment & Plan Note (Signed)
Chronic diastolic CHF in setting of long-standing HTN.  Suspect hypertensive cardiomyopathy.  He was diuresed over 40 lbs while in the hospital.  Volume status looks much better today.  BP is controlled.  Continue current torsemide and KCl for now.  Will check BMET/BNP today.  Followup in 3 wks.

## 2012-06-20 NOTE — Assessment & Plan Note (Signed)
BP is much better controlled on current regimen.  Continue current medications. Hopefully BP control will help control CHF.

## 2012-06-20 NOTE — Progress Notes (Signed)
Patient ID: Jesus [name], male   DOB: October 13, 1946, 66 y.o.   MRN: [id number] PCP: Dr. Jeannetta Nap  66 yo with history of atrial fibrillation, resistant HTN, and diastolic CHF presents for cardiology followup.  Jesus [name] has a long history of lower extremity edema.  He was told as early as 2003 that he has CHF.  He was seen by Dr. Patsy Lager in 8/12 at Urgent Care and was noted to be in atrial fibrillation.  CXR showed cardiomegaly.  He was started on coumadin but stopped it and did not followup again at Urgent Care due to transportation problems for INR checks.  He has been in atrial fibrillation when seen in this office.  He is not very mobile because of obesity and severe bilateral knee pain from arthritis.  He developed progressive lower extremity edema and scrotal edema to the point that he was not able to walk due to discomfort.  I admitted him recently for over a week.  He was diuresed > 40 lbs with significantly improved edema.  He developed a gout flare while in the hospital that improved with colchicine.    Currently, he is doing well.  He does not have significant exertional dyspnea but is limited by his knee pain from OA.  No chest pain.  He remains in atrial fibrillation today.    Labs (8/12): K 4.6, creatinine 0.62, BNP 227, HCT 39.9 Labs (5/13): LDL 116, HDL 30, proBNP 388 Labs (6/13): K 3.1, creatinine 0.7, proBNP 282, immunofixation with no monoclonal protein.  Labs (7/13): K 4.4, creatinine 1.24  PMH: 1. Obesity 2. Atrial fibrillation: First noted in 8/12, persistent.  3. Diastolic CHF: Echo (5/13) with severe LVH, biatrial enlargement, EF 50-55%.  4. HTN: since late teens, poor control.  5. Chronic lower extremity edema.  6. OA knees: severe.  7. Gout  FH: Father with atrial fibrillation.   SH: Divorced.  Lives in [place].  Used to be an appliance repairman but now on disability.  Nonsmoker.    ROS: All systems reviewed and negative except as per HPI.   Current Outpatient  Prescriptions  Medication Sig Dispense Refill  . carvedilol (COREG) 25 MG tablet Take 25 mg by mouth 2 (two) times daily with a meal.      . cloNIDine (CATAPRES) 0.2 MG tablet Take 2 tablets (0.4 mg total) by mouth 2 (two) times daily.  120 tablet  6  . colchicine 0.6 MG tablet Take 1 tablet (0.6 mg total) by mouth daily.  30 tablet  0  . lisinopril (PRINIVIL,ZESTRIL) 40 MG tablet Take 0.5 tablets (20 mg total) by mouth 2 (two) times daily.      . magnesium oxide (MAG-OX) 400 (241.3 MG) MG tablet Take 1 tablet (400 mg total) by mouth daily.  30 tablet  2  . potassium chloride SA (K-DUR,KLOR-CON) 20 MEQ tablet Take 2 tablets (40 mEq total) by mouth daily.      Marland Kitchen spironolactone (ALDACTONE) 25 MG tablet Take 1 tablet (25 mg total) by mouth daily.  30 tablet  6  . torsemide (DEMADEX) 20 MG tablet Take 3 tablets (60 mg total) by mouth daily.  90 tablet  6  . warfarin (COUMADIN) 5 MG tablet Take 1 tablet (5 mg total) by mouth as directed. FOR NOW, YOU WILL ALTERNATE 2.5 TABLETS (12.5mg ) AND 3 TABLETS (15mg ) EVERY OTHER DAY. Take 2 and a half tablets on Friday 06/11/12. Take 3 tablets on Saturday 06/12/12. Take 2 and a half tablets on Sunday 06/13/12.  On Monday, please have home health draw your blood for your Coumadin level check and they will tell you what your next dose should be.  90 tablet  2    BP 132/78  Pulse 70  Ht 5\' 9"  (1.753 m)  Wt 301 lb (136.533 kg)  BMI 44.45 kg/m2  SpO2 98% General: NAD, obese.  Neck: JVP 7-8 cm, no thyromegaly or thyroid nodule.  Lungs: Clear to auscultation bilaterally with normal respiratory effort. CV: Nondisplaced PMI.  Heart irregular S1/S2, no S3/S4, 1/6 SEM RUSB. 1+ edema 1/4 to knees bilaterally.  No carotid bruit.  Unable to palpate pedal pulses due to edema. .  Abdomen: Soft, nontender, no hepatosplenomegaly, no distention.  Skin: Right lower leg is erythematous and has probable venous stasis ulcerations.   Neurologic: Alert and oriented x 3.  Psych: Normal  affect. Extremities: No clubbing or cyanosis.

## 2012-06-20 NOTE — Assessment & Plan Note (Signed)
Chronic atrial fibrillation on coumadin.  He has been in atrial fibrillation for at least a year. I do not think cardioversion would be likely to be successful given the time in atrial fibrillation, and he is not interested in cardioversion anyway.    He snores loudly.  Will need to address the issue of sleep apnea at next appointment.

## 2012-06-21 ENCOUNTER — Telehealth: Payer: Self-pay | Admitting: Cardiology

## 2012-06-21 NOTE — Telephone Encounter (Signed)
Patient returning nurse call, he can be reached at [phone]

## 2012-06-21 NOTE — Telephone Encounter (Signed)
Pt.notified

## 2012-06-22 ENCOUNTER — Ambulatory Visit: Payer: Self-pay | Admitting: Cardiology

## 2012-06-22 DIAGNOSIS — I4891 Unspecified atrial fibrillation: Secondary | ICD-10-CM

## 2012-06-22 DIAGNOSIS — I10 Essential (primary) hypertension: Secondary | ICD-10-CM

## 2012-06-22 LAB — POCT INR: INR: 3.1

## 2012-06-29 ENCOUNTER — Ambulatory Visit: Payer: Self-pay | Admitting: Pharmacist

## 2012-06-29 DIAGNOSIS — I4891 Unspecified atrial fibrillation: Secondary | ICD-10-CM

## 2012-06-29 DIAGNOSIS — I10 Essential (primary) hypertension: Secondary | ICD-10-CM

## 2012-07-06 ENCOUNTER — Ambulatory Visit: Payer: Self-pay | Admitting: Pharmacist

## 2012-07-06 DIAGNOSIS — I10 Essential (primary) hypertension: Secondary | ICD-10-CM

## 2012-07-06 DIAGNOSIS — I4891 Unspecified atrial fibrillation: Secondary | ICD-10-CM

## 2012-07-07 ENCOUNTER — Ambulatory Visit (INDEPENDENT_AMBULATORY_CARE_PROVIDER_SITE_OTHER): Payer: Medicare HMO | Admitting: Cardiology

## 2012-07-07 ENCOUNTER — Encounter: Payer: Self-pay | Admitting: Cardiology

## 2012-07-07 VITALS — BP 132/82 | HR 75 | Ht 69.0 in | Wt 296.0 lb

## 2012-07-07 DIAGNOSIS — I509 Heart failure, unspecified: Secondary | ICD-10-CM

## 2012-07-07 DIAGNOSIS — I4891 Unspecified atrial fibrillation: Secondary | ICD-10-CM

## 2012-07-07 DIAGNOSIS — I5032 Chronic diastolic (congestive) heart failure: Secondary | ICD-10-CM

## 2012-07-07 DIAGNOSIS — I10 Essential (primary) hypertension: Secondary | ICD-10-CM

## 2012-07-07 NOTE — Patient Instructions (Addendum)
Your physician recommends that you return for a FASTING lipid profile in 2 months.  Your physician recommends that you schedule a follow-up appointment in: 2 months with Dr Shirlee Latch. You can have the lab done the same day you see Dr Shirlee Latch.  Your physician has recommended that you have a sleep study. This test records several body functions during sleep, including: brain activity, eye movement, oxygen and carbon dioxide blood levels, heart rate and rhythm, breathing rate and rhythm, the flow of air through your mouth and nose, snoring, body muscle movements, and chest and belly movement. Home sleep study done by [facility] Pulmonary [facility].

## 2012-07-07 NOTE — Assessment & Plan Note (Signed)
BP is much better controlled on current regimen.  Continue current medications. Hopefully BP control will help control CHF.  He has symptoms/signs that could be consistent with OSA.  This certainly can perpetuate HTN.  I will see if we can arrange for a home sleep study.

## 2012-07-07 NOTE — Progress Notes (Signed)
Patient ID: Jesus [name], male   DOB: 21-Feb-1946, 66 y.o.   MRN: [id number] PCP: Dr. Jeannetta Nap  66 yo with history of atrial fibrillation, resistant HTN, and diastolic CHF presents for cardiology followup.  Jesus [name] has a long history of lower extremity edema.  He was told as early as 2003 that he has CHF.  He was seen by Dr. Patsy Lager in 8/12 at [facility] and was noted to be in atrial fibrillation.  CXR showed cardiomegaly.  He was started on coumadin but stopped it and did not followup again at Urgent Care due to transportation problems for INR checks.  He has been in atrial fibrillation when seen in this office.  He is not very mobile because of obesity and severe bilateral knee pain from arthritis.  He developed progressive lower extremity edema and scrotal edema to the point that he was not able to walk due to discomfort.  I admitted him in 6/13 and he was diuresed > 40 lbs with significantly improved edema.    Currently, he is doing well.  Weight is down another 5 lbs compared to prior appointment.  He does not have significant exertional dyspnea but is limited by his knee pain from OA.  No chest pain.  He remains in atrial fibrillation today.  He does report significantly snoring and some daytime sleepiness.  BP remains controlled.   Labs (8/12): K 4.6, creatinine 0.62, BNP 227, HCT 39.9 Labs (5/13): LDL 116, HDL 30, proBNP 388 Labs (6/13): K 3.1, creatinine 0.7, proBNP 282, immunofixation with no monoclonal protein.  Labs (7/13): K 4.4, creatinine 1.24, BNP 319  PMH: 1. Obesity 2. Atrial fibrillation: First noted in 8/12, persistent.  3. Diastolic CHF: Echo (5/13) with severe LVH, biatrial enlargement, EF 50-55%.  4. HTN: since late teens.  5. Chronic lower extremity edema.  6. OA knees: severe.  7. Gout  FH: Father with atrial fibrillation.   SH: Divorced.  Lives in [place].  Used to be an appliance repairman but now on disability.  Nonsmoker.    ROS: All systems reviewed and  negative except as per HPI.   Current Outpatient Prescriptions  Medication Sig Dispense Refill  . carvedilol (COREG) 25 MG tablet Take 25 mg by mouth 2 (two) times daily with a meal.      . cloNIDine (CATAPRES) 0.2 MG tablet Take 2 tablets (0.4 mg total) by mouth 2 (two) times daily.  120 tablet  6  . colchicine 0.6 MG tablet Take 1 tablet (0.6 mg total) by mouth daily.  30 tablet  0  . lisinopril (PRINIVIL,ZESTRIL) 40 MG tablet Take 0.5 tablets (20 mg total) by mouth 2 (two) times daily.      . magnesium oxide (MAG-OX) 400 (241.3 MG) MG tablet Take 1 tablet (400 mg total) by mouth daily.  30 tablet  2  . potassium chloride SA (K-DUR,KLOR-CON) 20 MEQ tablet Take 2 tablets (40 mEq total) by mouth daily.      Marland Kitchen spironolactone (ALDACTONE) 25 MG tablet Take 1 tablet (25 mg total) by mouth daily.  30 tablet  6  . torsemide (DEMADEX) 20 MG tablet Take 3 tablets (60 mg total) by mouth daily.  90 tablet  6  . warfarin (COUMADIN) 5 MG tablet Take 1 tablet (5 mg total) by mouth as directed. FOR NOW, YOU WILL ALTERNATE 2.5 TABLETS (12.5mg ) AND 3 TABLETS (15mg ) EVERY OTHER DAY. Take 2 and a half tablets on Friday 06/11/12. Take 3 tablets on Saturday 06/12/12. Take 2  and a half tablets on Sunday 06/13/12. On Monday, please have home health draw your blood for your Coumadin level check and they will tell you what your next dose should be.  90 tablet  2    BP 132/82  Pulse 75  Ht 5\' 9"  (1.753 m)  Wt 296 lb (134.265 kg)  BMI 43.71 kg/m2  SpO2 97% General: NAD, obese.  Neck: Thick, JVP 7 cm, no thyromegaly or thyroid nodule.  Lungs: Clear to auscultation bilaterally with normal respiratory effort. CV: Nondisplaced PMI.  Heart irregular S1/S2, no S3/S4, 1/6 SEM RUSB. 1+ edema 1/4 to knees bilaterally.  No carotid bruit.  Unable to palpate pedal pulses due to edema. .  Abdomen: Soft, nontender, no hepatosplenomegaly, no distention.  Skin: Right lower leg is erythematous and has probable venous stasis ulcerations.     Neurologic: Alert and oriented x 3.  Psych: Normal affect. Extremities: No clubbing or cyanosis.

## 2012-07-07 NOTE — Assessment & Plan Note (Signed)
Chronic atrial fibrillation on coumadin.  He has been in atrial fibrillation for at least a year. I do not think cardioversion would be likely to be successful given the time in atrial fibrillation, and he is not interested in cardioversion anyway.

## 2012-07-07 NOTE — Assessment & Plan Note (Signed)
Chronic diastolic CHF in setting of long-standing HTN.  Suspect hypertensive cardiomyopathy.  He was diuresed over 40 lbs while in the hospital, weight is down another 6 lbs since last appointment.  BP is controlled.  Continue current torsemide and KCl.

## 2012-07-08 ENCOUNTER — Telehealth: Payer: Self-pay | Admitting: Cardiology

## 2012-07-08 NOTE — Telephone Encounter (Signed)
Received order for home sleep test for patient.  [employer] states no authorization is required, however in order for complete payment the study must be performed in conjunction with a complete and comprehensive sleep evaluation.    I spoke with [facility] to confirm this.    Per Dr. Shirlee Latch he is okay with the patient being set up for a sleep consult as long as the patient agrees.  I left a message for Mr. Lamoreaux yesterday and he returned my call.  He verbalized full understanding as to why he would need the consult first and appreciated [place] not going ahead with the test and having him end up with an unexpected bill.    He was not ready to make the decision if he wanted the consult, but stated he would think about it and give [place] a call back.  I gave him my direct number and advised him I would speak with Dr. Alford Highland nurse to get the orders in so we could set up the consult as soon as he was ready and I also made sure he knew he could ask for his nurse directly if he would prefer.    Mr. Delapena stated he would call in a few days and did mention he would probably go along with the consult.  I will follow up with him next week if we haven't heard from him.

## 2012-07-09 NOTE — Telephone Encounter (Signed)
LMTCB for pt. I was going to see if pt wants to schedule a consult with pulmonary  for sleep consult.

## 2012-07-09 NOTE — Telephone Encounter (Signed)
LMTCB

## 2012-07-09 NOTE — Telephone Encounter (Signed)
F/u  Patient returning call back to nurse. Will call back on Monday.

## 2012-07-12 ENCOUNTER — Telehealth: Payer: Self-pay | Admitting: Cardiology

## 2012-07-12 ENCOUNTER — Other Ambulatory Visit: Payer: Self-pay | Admitting: *Deleted

## 2012-07-12 MED ORDER — WARFARIN SODIUM 5 MG PO TABS: 5.0000 mg | ORAL_TABLET | ORAL | Status: DC

## 2012-07-12 MED ORDER — TORSEMIDE 20 MG PO TABS: 60.0000 mg | ORAL_TABLET | Freq: Every day | ORAL | Status: DC

## 2012-07-12 MED ORDER — POTASSIUM CHLORIDE CRYS ER 20 MEQ PO TBCR: 40.0000 meq | EXTENDED_RELEASE_TABLET | Freq: Every day | ORAL | Status: DC

## 2012-07-12 MED ORDER — SPIRONOLACTONE 25 MG PO TABS: 25.0000 mg | ORAL_TABLET | Freq: Every day | ORAL | Status: DC

## 2012-07-12 NOTE — Telephone Encounter (Signed)
Close  

## 2012-07-13 NOTE — Telephone Encounter (Signed)
LMTCB

## 2012-07-14 NOTE — Telephone Encounter (Signed)
F/u  Patient calling wants to put  sleep study  On hold for right now.

## 2012-07-20 ENCOUNTER — Ambulatory Visit: Payer: Self-pay | Admitting: Cardiovascular Disease

## 2012-07-20 DIAGNOSIS — I10 Essential (primary) hypertension: Secondary | ICD-10-CM

## 2012-07-20 DIAGNOSIS — I4891 Unspecified atrial fibrillation: Secondary | ICD-10-CM

## 2012-07-26 ENCOUNTER — Other Ambulatory Visit: Payer: Self-pay | Admitting: *Deleted

## 2012-07-26 MED ORDER — LISINOPRIL 40 MG PO TABS: 20.0000 mg | ORAL_TABLET | Freq: Two times a day (BID) | ORAL | Status: DC

## 2012-07-26 MED ORDER — CARVEDILOL 25 MG PO TABS: 25.0000 mg | ORAL_TABLET | Freq: Two times a day (BID) | ORAL | Status: DC

## 2012-07-26 MED ORDER — CLONIDINE HCL 0.2 MG PO TABS: 0.4000 mg | ORAL_TABLET | Freq: Two times a day (BID) | ORAL | Status: DC

## 2012-07-28 ENCOUNTER — Other Ambulatory Visit: Payer: Self-pay | Admitting: *Deleted

## 2012-07-28 MED ORDER — CARVEDILOL 25 MG PO TABS: 25.0000 mg | ORAL_TABLET | Freq: Two times a day (BID) | ORAL | Status: DC

## 2012-07-30 ENCOUNTER — Other Ambulatory Visit: Payer: Self-pay | Admitting: *Deleted

## 2012-07-30 MED ORDER — CLONIDINE HCL 0.2 MG PO TABS: 0.4000 mg | ORAL_TABLET | Freq: Two times a day (BID) | ORAL | Status: DC

## 2012-07-30 MED ORDER — LISINOPRIL 40 MG PO TABS: 20.0000 mg | ORAL_TABLET | Freq: Two times a day (BID) | ORAL | Status: DC

## 2012-07-30 MED ORDER — CARVEDILOL 25 MG PO TABS: 25.0000 mg | ORAL_TABLET | Freq: Two times a day (BID) | ORAL | Status: DC

## 2012-08-10 ENCOUNTER — Ambulatory Visit: Payer: Self-pay | Admitting: Cardiovascular Disease

## 2012-08-10 DIAGNOSIS — I4891 Unspecified atrial fibrillation: Secondary | ICD-10-CM

## 2012-08-10 DIAGNOSIS — I10 Essential (primary) hypertension: Secondary | ICD-10-CM

## 2012-09-06 ENCOUNTER — Other Ambulatory Visit (INDEPENDENT_AMBULATORY_CARE_PROVIDER_SITE_OTHER): Payer: Medicare HMO

## 2012-09-06 ENCOUNTER — Ambulatory Visit (INDEPENDENT_AMBULATORY_CARE_PROVIDER_SITE_OTHER): Payer: Medicare HMO

## 2012-09-06 ENCOUNTER — Ambulatory Visit (INDEPENDENT_AMBULATORY_CARE_PROVIDER_SITE_OTHER): Payer: Medicare HMO | Admitting: Cardiology

## 2012-09-06 ENCOUNTER — Encounter: Payer: Self-pay | Admitting: Cardiology

## 2012-09-06 VITALS — BP 152/84 | HR 62 | Ht 69.0 in | Wt 296.1 lb

## 2012-09-06 DIAGNOSIS — R0989 Other specified symptoms and signs involving the circulatory and respiratory systems: Secondary | ICD-10-CM

## 2012-09-06 DIAGNOSIS — I4891 Unspecified atrial fibrillation: Secondary | ICD-10-CM

## 2012-09-06 DIAGNOSIS — I509 Heart failure, unspecified: Secondary | ICD-10-CM

## 2012-09-06 DIAGNOSIS — Z7901 Long term (current) use of anticoagulants: Secondary | ICD-10-CM

## 2012-09-06 DIAGNOSIS — I10 Essential (primary) hypertension: Secondary | ICD-10-CM

## 2012-09-06 DIAGNOSIS — I5032 Chronic diastolic (congestive) heart failure: Secondary | ICD-10-CM

## 2012-09-06 LAB — BASIC METABOLIC PANEL
BUN: 36 mg/dL — ABNORMAL HIGH (ref 6–23)
Chloride: 89 mEq/L — ABNORMAL LOW (ref 96–112)
GFR: 49.34 mL/min — ABNORMAL LOW (ref >60.00)
Potassium: 5.5 mEq/L — ABNORMAL HIGH (ref 3.5–5.1)
Sodium: 123 mEq/L — ABNORMAL LOW (ref 135–145)

## 2012-09-06 LAB — LIPID PANEL
Cholesterol: 204 mg/dL — ABNORMAL HIGH (ref 0–200)
HDL: 36.5 mg/dL — ABNORMAL LOW (ref >39.00)
Triglycerides: 106 mg/dL (ref 0.0–149.0)
VLDL: 21.2 mg/dL (ref 0.0–40.0)

## 2012-09-06 LAB — BRAIN NATRIURETIC PEPTIDE: Pro B Natriuretic peptide (BNP): 351 pg/mL — ABNORMAL HIGH (ref 0.0–100.0)

## 2012-09-06 LAB — POCT INR: INR: 1.9

## 2012-09-06 NOTE — Progress Notes (Signed)
Patient ID: Jesus [name], male   DOB: November 10, 1946, 66 y.o.   MRN: [id number] PCP: Dr. Jeannetta Nap  66 yo with history of atrial fibrillation, resistant HTN, and diastolic CHF presents for cardiology followup.  Jesus [name] has a long history of lower extremity edema.  He was told as early as 2003 that he has CHF.  He was seen by Dr. Patsy Lager in 8/12 at Urgent Care and was noted to be in atrial fibrillation.  CXR showed cardiomegaly.  He was started on coumadin but stopped it and did not followup again at Urgent Care due to transportation problems for INR checks.  He has been in atrial fibrillation when seen in this office.  He is not very mobile because of obesity and severe bilateral knee pain from arthritis.  He developed progressive lower extremity edema and scrotal edema to the point that he was not able to walk due to discomfort.  I admitted him in 6/13 and he was diuresed > 40 lbs with significantly improved edema.    Currently, he is doing well.  Weight is stable compared to prior appointment.  He does not have significant exertional dyspnea but is limited by his knee pain from OA.  No chest pain.  He remains in atrial fibrillation today.  He does report significantly snoring and some daytime sleepiness but has not wanted to have a sleep study done.  BP is mildly elevated today but he has not taken his clonidine.  When he checks, his BP tends to range from 120s-150s.  Labs (8/12): K 4.6, creatinine 0.62, BNP 227, HCT 39.9 Labs (5/13): LDL 116, HDL 30, proBNP 388 Labs (6/13): K 3.1, creatinine 0.7, proBNP 282, immunofixation with no monoclonal protein.  Labs (7/13): K 4.4, creatinine 1.24, BNP 319  PMH: 1. Obesity 2. Atrial fibrillation: First noted in 8/12, persistent.  3. Diastolic CHF: Echo (5/13) with severe LVH, biatrial enlargement, EF 50-55%.  4. HTN: since late teens.  5. Chronic lower extremity edema.  6. OA knees: severe.  7. Gout  FH: Father with atrial fibrillation.   SH: Divorced.   Lives in [place].  Used to be an appliance repairman but now on disability.  Nonsmoker.    Current Outpatient Prescriptions  Medication Sig Dispense Refill  . carvedilol (COREG) 25 MG tablet Take 1 tablet (25 mg total) by mouth 2 (two) times daily with a meal.  180 tablet  1  . cloNIDine (CATAPRES) 0.2 MG tablet Take 2 tablets (0.4 mg total) by mouth 2 (two) times daily.  360 tablet  1  . colchicine 0.6 MG tablet Take 1 tablet (0.6 mg total) by mouth daily.  30 tablet  0  . lisinopril (PRINIVIL,ZESTRIL) 40 MG tablet Take 0.5 tablets (20 mg total) by mouth 2 (two) times daily.  90 tablet  1  . magnesium oxide (MAG-OX) 400 (241.3 MG) MG tablet Take 1 tablet (400 mg total) by mouth daily.  30 tablet  2  . potassium chloride SA (K-DUR,KLOR-CON) 20 MEQ tablet Take 2 tablets (40 mEq total) by mouth daily.  180 tablet  1  . spironolactone (ALDACTONE) 25 MG tablet Take 1 tablet (25 mg total) by mouth daily.  90 tablet  1  . torsemide (DEMADEX) 20 MG tablet Take 3 tablets (60 mg total) by mouth daily.  270 tablet  1  . warfarin (COUMADIN) 5 MG tablet Take 1 tablet (5 mg total) by mouth as directed.  105 tablet  1  BP 152/84  Pulse 62  Ht 5\' 9"  (1.753 m)  Wt 296 lb 1.9 oz (134.319 kg)  BMI 43.73 kg/m2  SpO2 92% General: NAD, obese.  Neck: Thick, JVP 7 cm, no thyromegaly or thyroid nodule.  Lungs: Clear to auscultation bilaterally with normal respiratory effort. CV: Nondisplaced PMI.  Heart irregular S1/S2, no S3/S4, 1/6 SEM RUSB. 1+ edema 1/4 to knees bilaterally.  No carotid bruit.  Unable to palpate pedal pulses due to edema. .  Abdomen: Soft, nontender, no hepatosplenomegaly, no distention.  Skin: Right lower leg is erythematous and has probable venous stasis ulcerations.   Neurologic: Alert and oriented x 3.  Psych: Normal affect. Extremities: No clubbing or cyanosis.   Assessment/Plan: Chronic Diastolic CHF    Chronic diastolic CHF in setting of long-standing HTN. Suspect hypertensive cardiomyopathy. BP is under better control.  Volume looks ok today. Continue current torsemide and KCl. Atrial fibrillation Chronic atrial fibrillation on coumadin. He has been in atrial fibrillation for at least a year. I do not think cardioversion would be likely to be successful given the time in atrial fibrillation, and he is not interested in cardioversion anyway.  I offered to let him use Xarelto or apixaban instead of coumadin, but he is uncomfortable with these medications because they have not been available for long.  Hypertension  BP is doing well most of the time.  I will continue current regimen for now.  Suspect OSA contributes to HTN, but he does not want a sleep study.   [name] 09/06/2012 12:10 PM

## 2012-09-06 NOTE — Patient Instructions (Addendum)
Your physician recommends that you have a FASTING lipid profile /BMET/BNP today.  Your physician wants you to follow-up in: 4 months with Dr Shirlee Latch. (January 2014).  You will receive a reminder letter in the mail two months in advance. If you don't receive a letter, please call our office to schedule the follow-up appointment.

## 2012-09-07 ENCOUNTER — Other Ambulatory Visit: Payer: Self-pay | Admitting: *Deleted

## 2012-09-07 DIAGNOSIS — E875 Hyperkalemia: Secondary | ICD-10-CM

## 2012-09-10 ENCOUNTER — Other Ambulatory Visit (INDEPENDENT_AMBULATORY_CARE_PROVIDER_SITE_OTHER): Payer: Medicare HMO

## 2012-09-10 DIAGNOSIS — Z79899 Other long term (current) drug therapy: Secondary | ICD-10-CM

## 2012-09-10 DIAGNOSIS — I5032 Chronic diastolic (congestive) heart failure: Secondary | ICD-10-CM

## 2012-09-10 DIAGNOSIS — I10 Essential (primary) hypertension: Secondary | ICD-10-CM

## 2012-09-10 DIAGNOSIS — E875 Hyperkalemia: Secondary | ICD-10-CM

## 2012-09-10 LAB — BASIC METABOLIC PANEL
CO2: 29 mEq/L (ref 19–32)
Chloride: 85 mEq/L — ABNORMAL LOW (ref 96–112)
Potassium: 4.9 mEq/L (ref 3.5–5.1)
Sodium: 123 mEq/L — ABNORMAL LOW (ref 135–145)

## 2012-09-10 LAB — LIPID PANEL
Total CHOL/HDL Ratio: 6
VLDL: 32 mg/dL (ref 0.0–40.0)

## 2012-09-10 LAB — LDL CHOLESTEROL, DIRECT: Direct LDL: 138.2 mg/dL

## 2012-09-13 ENCOUNTER — Other Ambulatory Visit: Payer: Self-pay | Admitting: *Deleted

## 2012-09-13 DIAGNOSIS — I5032 Chronic diastolic (congestive) heart failure: Secondary | ICD-10-CM

## 2012-09-21 ENCOUNTER — Other Ambulatory Visit (INDEPENDENT_AMBULATORY_CARE_PROVIDER_SITE_OTHER): Payer: Medicare HMO

## 2012-09-21 DIAGNOSIS — I5032 Chronic diastolic (congestive) heart failure: Secondary | ICD-10-CM

## 2012-09-21 LAB — BASIC METABOLIC PANEL
CO2: 27 mEq/L (ref 19–32)
Calcium: 9.7 mg/dL (ref 8.4–10.5)
Glucose, Bld: 105 mg/dL — ABNORMAL HIGH (ref 70–99)
Potassium: 4.7 mEq/L (ref 3.5–5.1)
Sodium: 125 mEq/L — ABNORMAL LOW (ref 135–145)

## 2012-09-22 ENCOUNTER — Other Ambulatory Visit: Payer: Self-pay | Admitting: *Deleted

## 2012-09-22 MED ORDER — CLONIDINE HCL 0.2 MG PO TABS: 0.4000 mg | ORAL_TABLET | Freq: Two times a day (BID) | ORAL | Status: DC

## 2012-09-22 MED ORDER — SPIRONOLACTONE 25 MG PO TABS: 25.0000 mg | ORAL_TABLET | Freq: Every day | ORAL | Status: DC

## 2012-09-22 MED ORDER — TORSEMIDE 20 MG PO TABS: 60.0000 mg | ORAL_TABLET | Freq: Every day | ORAL | Status: DC

## 2012-09-22 MED ORDER — CARVEDILOL 25 MG PO TABS: 25.0000 mg | ORAL_TABLET | Freq: Two times a day (BID) | ORAL | Status: DC

## 2012-09-27 ENCOUNTER — Ambulatory Visit (INDEPENDENT_AMBULATORY_CARE_PROVIDER_SITE_OTHER): Payer: Medicare HMO | Admitting: *Deleted

## 2012-09-27 DIAGNOSIS — Z7901 Long term (current) use of anticoagulants: Secondary | ICD-10-CM

## 2012-09-27 DIAGNOSIS — I4891 Unspecified atrial fibrillation: Secondary | ICD-10-CM

## 2012-09-27 DIAGNOSIS — I10 Essential (primary) hypertension: Secondary | ICD-10-CM

## 2012-09-27 LAB — POCT INR: INR: 1.4

## 2012-10-22 ENCOUNTER — Ambulatory Visit: Payer: Self-pay | Admitting: Cardiology

## 2012-10-22 ENCOUNTER — Telehealth: Payer: Self-pay | Admitting: *Deleted

## 2012-10-22 DIAGNOSIS — I10 Essential (primary) hypertension: Secondary | ICD-10-CM

## 2012-10-22 DIAGNOSIS — I4891 Unspecified atrial fibrillation: Secondary | ICD-10-CM

## 2012-10-22 NOTE — Telephone Encounter (Signed)
Pt called to inform us that he is not going to take his coumadin anymore. He doesn't want to try any of the other drugs on market. He states he is aware of his risks, states he will take his chances.

## 2013-04-05 ENCOUNTER — Other Ambulatory Visit: Payer: Self-pay | Admitting: *Deleted

## 2013-04-05 MED ORDER — TORSEMIDE 20 MG PO TABS: 60.0000 mg | ORAL_TABLET | Freq: Every day | ORAL | Status: DC

## 2013-04-05 MED ORDER — LISINOPRIL 40 MG PO TABS: 20.0000 mg | ORAL_TABLET | Freq: Two times a day (BID) | ORAL | Status: DC

## 2013-04-06 ENCOUNTER — Telehealth: Payer: Self-pay | Admitting: Cardiology

## 2013-04-06 NOTE — Telephone Encounter (Addendum)
New problem   Torsemide  20 mg     Lisinopril  40 mg .    [facility]

## 2013-04-07 NOTE — Telephone Encounter (Signed)
Returned patient's call about medication. Was unable to reach patient. Medication was sent in yesterday 04/06/13 to [facility] with a 90 day rx with 1RF with a reminder to make a follow up appointment.   Micki Riley, CMA

## 2013-04-07 NOTE — Telephone Encounter (Signed)
Follow up call     Patient calling back regarding rx refills.

## 2013-04-08 ENCOUNTER — Other Ambulatory Visit: Payer: Self-pay | Admitting: *Deleted

## 2013-04-08 DIAGNOSIS — R209 Unspecified disturbances of skin sensation: Secondary | ICD-10-CM

## 2013-04-14 ENCOUNTER — Encounter: Payer: Self-pay | Admitting: Vascular Surgery

## 2013-04-15 ENCOUNTER — Ambulatory Visit (INDEPENDENT_AMBULATORY_CARE_PROVIDER_SITE_OTHER): Payer: Medicare HMO | Admitting: Vascular Surgery

## 2013-04-15 ENCOUNTER — Encounter (INDEPENDENT_AMBULATORY_CARE_PROVIDER_SITE_OTHER): Payer: Medicare HMO | Admitting: *Deleted

## 2013-04-15 ENCOUNTER — Encounter: Payer: Self-pay | Admitting: Vascular Surgery

## 2013-04-15 VITALS — BP 174/95 | HR 64 | Ht 69.0 in | Wt 301.7 lb

## 2013-04-15 DIAGNOSIS — I739 Peripheral vascular disease, unspecified: Secondary | ICD-10-CM

## 2013-04-15 DIAGNOSIS — I872 Venous insufficiency (chronic) (peripheral): Secondary | ICD-10-CM

## 2013-04-15 DIAGNOSIS — R209 Unspecified disturbances of skin sensation: Secondary | ICD-10-CM

## 2013-04-15 DIAGNOSIS — I83893 Varicose veins of bilateral lower extremities with other complications: Secondary | ICD-10-CM

## 2013-04-15 NOTE — Addendum Note (Signed)
Addended by: Adria Dill L on: 04/15/2013 01:40 PM   Modules accepted: Orders

## 2013-04-15 NOTE — Progress Notes (Signed)
[facility]  Referred by:  Kaleen Mask, MD [address] [place], [place] [address]  Reason for referral: Swollen bilateral legs  History of Present Illness  Jesus [name] is a 66 y.o. (06-Jun-1946) male who presents with chief complaint: B swollen leg.  Patient notes, onset of swelling 4-5 years ago, associated with no obvious triggers.  The patient has had no history of DVT, no history of varicose vein, known history of venous stasis ulcers, no history of  Lymphedema and known history of skin changes in lower legs.  There is no family history of venous disorders.  The patient has used OTC knee high compression stockings in the past.  Previously he has had an episode with frank ulceration in his lower leg requiring compressive therapy.  The patient denies any bursting sensation but does accumulate fluid in his lower leg as the day progresses.  Past Medical History  Diagnosis Date  . Hypertension     Note: possible increased edema in past with CCBs, so these are avoided in this patient  . Morbid obesity   . H/O: GI bleed     "cause I took too much aspirin"  . Venous stasis ulcer   . Atrial fibrillation     First noted in 8/12, probably persistent  . Edema of lower extremity   . Chronic diastolic CHF (congestive heart failure) 12/2002    a. EF 50-55% by echo 04/2012  . Gout   . Hyperglycemia     Noted 05/2012    Past Surgical History  Procedure Laterality Date  . No past surgeries      History   Social History  . Marital Status: Divorced    Spouse Name: N/A    Number of Children: N/A  . Years of Education: N/A   Occupational History  . Retired    Social History Main Topics  . Smoking status: Former Smoker    Types: Cigarettes    Quit date: 12/08/1965  . Smokeless tobacco: Never Used     Comment: "social cigarette smoker in my late teens"  . Alcohol Use: 8.4 oz/week    14 Cans of beer per week  . Drug Use: No  . Sexually  Active: Not Currently   Other Topics Concern  . Not on file   Social History Narrative   Patient lives alone.    Family History  Problem Relation Age of Onset  . Arrhythmia Father   . Hypertension Father   . Cancer Mother     cervical    Current Outpatient Prescriptions on File Prior to Visit  Medication Sig Dispense Refill  . carvedilol (COREG) 25 MG tablet Take 1 tablet (25 mg total) by mouth 2 (two) times daily with a meal.  180 tablet  3  . lisinopril (PRINIVIL,ZESTRIL) 40 MG tablet Take 0.5 tablets (20 mg total) by mouth 2 (two) times daily.  90 tablet  1  . spironolactone (ALDACTONE) 25 MG tablet Take 1 tablet (25 mg total) by mouth daily.  90 tablet  3  . torsemide (DEMADEX) 20 MG tablet Take 3 tablets (60 mg total) by mouth daily.  270 tablet  1  . cloNIDine (CATAPRES) 0.2 MG tablet Take 2 tablets (0.4 mg total) by mouth 2 (two) times daily.  360 tablet  3  . colchicine 0.6 MG tablet Take 1 tablet (0.6 mg total) by mouth daily.  30 tablet  0  . magnesium oxide (MAG-OX) 400 (241.3 MG) MG  tablet Take 1 tablet (400 mg total) by mouth daily.  30 tablet  2  . warfarin (COUMADIN) 5 MG tablet Take 1 tablet (5 mg total) by mouth as directed.  105 tablet  1   No current facility-administered medications on file prior to visit.    Allergies  Allergen Reactions  . Amlodipine Besylate Swelling    "started off low and ended up severe; if, in fact, that is what's causing the swelling" (06/03/12)    REVIEW OF SYSTEMS:  (Positives checked otherwise negative)  CARDIOVASCULAR:  [ ]  chest pain, [ ]  chest pressure, [ ]  palpitations, [ ]  shortness of breath when laying flat, [ ]  shortness of breath with exertion,   [x]  pain in feet when walking, [ ]  pain in feet when laying flat, [ ]  history of blood clot in veins (DVT), [ ]  history of phlebitis, [x]  swelling in legs, [ ]  varicose veins  PULMONARY:  [ ]  productive cough, [ ]  asthma, [ ]  wheezing  NEUROLOGIC:  [ ]  weakness in arms or  legs, [x]  numbness in arms or legs, [ ]  difficulty speaking or slurred speech, [ ]  temporary loss of vision in one eye, [ ]  dizziness  HEMATOLOGIC:  [ ]  bleeding problems, [ ]  problems with blood clotting too easily  MUSCULOSKEL:  [x]  joint pain: L ankle and R knee injury , [ ]  joint swelling  GASTROINTEST:  [ ]   Vomiting blood, [ ]   Blood in stool     GENITOURINARY:  [ ]   Burning with urination, [ ]   Blood in urine  PSYCHIATRIC:  [ ]  history of major depression  INTEGUMENTARY:  [ ]  rashes, [ ]  ulcers  CONSTITUTIONAL:  [ ]  fever, [ ]  chills  Physical Examination  Filed Vitals:   04/15/13 1142  BP: 174/95  Pulse: 64  Height: 5\' 9"  (1.753 m)  Weight: 301 lb 11.2 oz (136.85 kg)  SpO2: 98%   Body mass index is 44.53 kg/(m^2).  General: A&O x 3, WD, morbidly obese  Head: [place]/AT  Ear/Nose/Throat: Hearing grossly intact, nares w/o erythema or drainage, oropharynx w/o Erythema/Exudate  Eyes: PERRLA, EOMI  Neck: Supple, no nuchal rigidity, no palpable LAD  Pulmonary: Sym exp, good air movt, CTAB, no rales, rhonchi, & wheezing  Cardiac: RRR, Nl S1, S2, no Murmurs, rubs or gallops  Vascular: Vessel Right Left  Radial Palpable Palpable  Ulnar Palpable Palpable  Brachial Palpable Palpable  Carotid Palpable, without bruit Palpable, without bruit  Aorta Not palpable N/A  Femoral Palpable Palpable  Popliteal Not palpable Not palpable  PT Faintly Palpable Faintly Palpable  DP Faintly Palpable Faintly Palpable   Gastrointestinal: soft, NTND, -G/R, - HSM, - masses, - CVAT B, aorta not palpable due to obesity  Musculoskeletal: M/S 5/5 throughout , Extremities without ischemic changes , B lower leg cyanotic, visible varicosities, LDS bilaterally, 1-2+ edema  Neurologic: CN 2-12 intact , Pain and light touch intact in extremities , Motor exam as listed above  Psychiatric: Judgment intact, Mood & affect appropriate for pt's clinical situation  Dermatologic: See M/S exam for  extremity exam, no rashes otherwise noted  Lymph : No Cervical, Axillary, or Inguinal lymphadenopathy   Non-Invasive Vascular Imaging  ABI (Date: 04/15/13)  R: [place], DP: tri, PT: bi  L: 1.04, DP: Bi, PT: Bi  Outside Studies/Documentation 5 pages of outside documents were reviewed including: outside clinic chart.  Medical Decision Making  Jesus [name] is a 67 y.o. male who presents with: chronic venous insufficiency (  C5), minimal BLE PAD.   The patient clinically has evidence of healed prior venous stasis ulcer with evidence of lipodermatosclerosis.  To prevent development of further progression and venous stasis ulceration, confirmatory studies and compressive therapy will be initiated.  Based on the patient's history and examination, I recommend: thigh high compression stockings 20-30 mm Hg.  The patient will follow up in 4 weeks with the following studies: BLE venous insufficiency duplex.  Thank you for allowing [place] to participate in this patient's care.  Leonides Sake, MD [facility] Office: [phone] Pager: [phone]  04/15/2013, 12:30 PM

## 2013-04-20 ENCOUNTER — Encounter: Payer: Self-pay | Admitting: Nephrology

## 2013-05-26 ENCOUNTER — Encounter: Payer: Self-pay | Admitting: Vascular Surgery

## 2013-05-27 ENCOUNTER — Ambulatory Visit: Payer: Medicare HMO | Admitting: Vascular Surgery

## 2013-06-08 ENCOUNTER — Telehealth: Payer: Self-pay | Admitting: Cardiology

## 2013-06-08 NOTE — Telephone Encounter (Signed)
New Prob     Pt states he has gained some weight and wants to know if an increase in fluid pill is necessary. Please call.

## 2013-06-08 NOTE — Telephone Encounter (Signed)
Pt.notified

## 2013-06-08 NOTE — Telephone Encounter (Signed)
Increase torsemide for now to 80 mg daily, get BMET/BNP in 1 week, needs to see me in the office ASAP. Suspect he has significant volume.

## 2013-06-08 NOTE — Telephone Encounter (Signed)
Pt states his weight was 288lbs when he was discharged from the hospital 1 year ago. In the last 2 months his weight has gradually increased to 315lbs. Pt states he does weigh regularly and it can vary up or down by as much as 10 pounds in 24 hours. He thinks his eating habits are not good, he eats late at night and the wrong foods. He does notice a some  increase in the size of his calves, he does use bilateral knee high support stockings. He has been to a vascular doctor for evaluation.  He takes torsemide 60mg  daily and is asking if this should be increased. I will forward to Dr Shirlee Latch for review.

## 2013-06-08 NOTE — Telephone Encounter (Signed)
Spoke with patient. Pt states he has gained weight in the last 2 months.

## 2013-06-09 ENCOUNTER — Encounter: Payer: Self-pay | Admitting: Cardiology

## 2013-06-09 ENCOUNTER — Ambulatory Visit (INDEPENDENT_AMBULATORY_CARE_PROVIDER_SITE_OTHER): Payer: Medicare HMO | Admitting: Cardiology

## 2013-06-09 VITALS — BP 148/90 | HR 55 | Ht 69.0 in | Wt 317.0 lb

## 2013-06-09 DIAGNOSIS — I4891 Unspecified atrial fibrillation: Secondary | ICD-10-CM

## 2013-06-09 DIAGNOSIS — I5032 Chronic diastolic (congestive) heart failure: Secondary | ICD-10-CM

## 2013-06-09 DIAGNOSIS — I10 Essential (primary) hypertension: Secondary | ICD-10-CM

## 2013-06-09 LAB — BASIC METABOLIC PANEL
CO2: 29 mEq/L (ref 19–32)
Calcium: 9.1 mg/dL (ref 8.4–10.5)
Creatinine, Ser: 1 mg/dL (ref 0.4–1.5)
GFR: 81.08 mL/min (ref >60.00)
Glucose, Bld: 133 mg/dL — ABNORMAL HIGH (ref 70–99)

## 2013-06-09 MED ORDER — POTASSIUM CHLORIDE CRYS ER 20 MEQ PO TBCR: EXTENDED_RELEASE_TABLET | ORAL | Status: DC

## 2013-06-09 MED ORDER — METOLAZONE 2.5 MG PO TABS: ORAL_TABLET | ORAL | Status: DC

## 2013-06-09 MED ORDER — TORSEMIDE 20 MG PO TABS: ORAL_TABLET | ORAL | Status: DC

## 2013-06-09 NOTE — Patient Instructions (Addendum)
Increase torsemide to 100mg  daily in the morning. This will be 5 of your 20mg  tablet daily in the morning at the same time.  Start metolazone(zaroxlyn) 2.5 mg weekly on Fridays 30 minutes before you take torsemide.  Start KCL 40 mEq(2 of a 20 mEq tablet) weekly on Fridays when you take metolazone.   Your physician recommends that you have  lab work today---BMET/BNP   Your physician has requested that you have an echocardiogram. Echocardiography is a painless test that uses sound waves to create images of your heart. It provides your doctor with information about the size and shape of your heart and how well your heart's chambers and valves are working. This procedure takes approximately one hour. There are no restrictions for this procedure.   Your physician recommends that you return for lab work in: 2 weeks--BMET/BNP.   Your physician recommends that you schedule a follow-up appointment in: 1 month with PA/NP.

## 2013-06-09 NOTE — Progress Notes (Signed)
Patient ID: Jesus [name], male   DOB: [birth year], 67 y.o.   MRN: [id number] PCP: Dr. Jeannetta Nap  67 yo with history of chronic atrial fibrillation, resistant HTN, and diastolic CHF presents for cardiology followup.  [name] has a long history of lower extremity edema.  He was told as early as 2003 that he has CHF.  He was seen by Dr. Patsy [name] in 8/12 at [facility] and was noted to be in atrial fibrillation.  He has been in atrial fibrillation chronically since that time.  He is not very mobile because of obesity and severe bilateral knee pain from arthritis.  He developed progressive lower extremity edema and scrotal edema to the point that he was not able to walk due to discomfort.  I admitted him in 6/13 and he was diuresed > 40 lbs with significantly improved edema.    He has not followed up since last fall.  At that time, I had him on coumadin but he stopped it some time around 11/13.  He has not had stroke-like symptoms.  He also stopped clonidine because it made him feel "strange."  He says that SBP tends to run around 140.  He checks it daily.  Today, BP was 148/90. He is taking his other medications as ordered except he increased his torsemide from 60 to 80 about a month ago due to increased weight and lower extremity edema.  Today, weight is up 21 lbs compared to last appointment.  He states that his legs and abdomen have been swelling more.  He is not very active but does not think that exertional dyspnea has worsened . He is mainly limited by knee pain.  He does get short of breath walking longer distances (100 yards).  He has not had labs done in this office since 10/13 but says that he has been getting labs through his nephrologist.   ECG: atrial fibrillation at 55, LAFB, lateral T wave inversions  Labs (8/12): K 4.6, creatinine 0.62, BNP 227, HCT 39.9 Labs (5/13): LDL 116, HDL 30, proBNP 388 Labs (6/13): K 3.1, creatinine 0.7, proBNP 282, immunofixation with no monoclonal protein.  Labs  (7/13): K 4.4, creatinine 1.24, BNP 319 Labs (10/13): K 4.7, creatinine 1.3, LDL 138  PMH: 1. Obesity 2. Atrial fibrillation: First noted in 8/12, persistent.  3. Diastolic CHF: Echo (5/13) with severe LVH, biatrial enlargement, EF 50-55%.  4. HTN: since late teens.  5. Chronic lower extremity edema/venous insufficiency.  6. OA knees: severe.  7. Gout  FH: Father with atrial fibrillation.   SH: Divorced.  Lives in [place].  Used to be an appliance repairman but now on disability.  Nonsmoker.    ROS: All systems reviewed and negative except as per HPI.   Current Outpatient Prescriptions  Medication Sig Dispense Refill  . carvedilol (COREG) 25 MG tablet Take 1 tablet (25 mg total) by mouth 2 (two) times daily with a meal.  180 tablet  3  . colchicine 0.6 MG tablet Take 1 tablet (0.6 mg total) by mouth daily.  30 tablet  0  . lisinopril (PRINIVIL,ZESTRIL) 40 MG tablet Take 0.5 tablets (20 mg total) by mouth 2 (two) times daily.  90 tablet  1  . magnesium oxide (MAG-OX) 400 (241.3 MG) MG tablet Take 1 tablet (400 mg total) by mouth daily.  30 tablet  2  . spironolactone (ALDACTONE) 25 MG tablet Take 1 tablet (25 mg total) by mouth daily.  90 tablet  3  . warfarin (COUMADIN)  5 MG tablet Take 1 tablet (5 mg total) by mouth as directed.  105 tablet  1  . metolazone (ZAROXOLYN) 2.5 MG tablet 1 tablet weekly on Fridays 30 minutes before you take torsemide  10 tablet  1  . potassium chloride SA (K-DUR,KLOR-CON) 20 MEQ tablet 2 tablets (total 40 mEq) daily on the day you take metolazone (zaroxlyn)  15 tablet  1  . torsemide (DEMADEX) 20 MG tablet 5 tablets (total 100mg ) at the same time in the morning  450 tablet  0   No current facility-administered medications for this visit.                                                                BP 148/90  Pulse 55  Ht 5\' 9"  (1.753 m)  Wt 317 lb (143.79 kg)  BMI 46.79 kg/m2 General: NAD, obese.  Neck: Thick, JVP 10 cm, no  thyromegaly or thyroid nodule.  Lungs: Clear to auscultation bilaterally with normal respiratory effort. CV: Nondisplaced PMI.  Heart irregular S1/S2, no S3/S4, 1/6 SEM RUSB. 1+ edema to knees bilaterally.  No carotid bruit.  Unable to palpate pedal pulses due to edema. .  Abdomen: Soft, nontender, no hepatosplenomegaly, mild distention. Skin: Right lower leg is erythematous and has probable venous stasis ulcerations.   Neurologic: Alert and oriented x 3.  Psych: Normal affect. Extremities: No clubbing or cyanosis.   Assessment/Plan: Chronic Diastolic CHF  Chronic diastolic CHF in setting of long-standing HTN and atrial fibrillation. Suspect hypertensive cardiomyopathy based on last echo.  Weight is up and he looks volume overloaded on exam.  He already increased torsemide to 80 mg daily about a month ago.   - Echo to reassess LV and RV function. - Increase torsemide to 100 mg daily witih metolazone 2.5 mg 30 minutes pre-torsemide once a week. On metolazone days only, he will take KCl 40 mEq. - BMET/BNP today and in 2 wks.  - Followup in office in 1 month.  Atrial fibrillation Chronic atrial fibrillation.  Rate is controlled.  We talked at length about anticoagulation today.  He does not want to take coumadin anymore ("my father lived to 18 with atrial fibrillation and did not have a stroke") and he does not want to take a NOAC ("I researched them on the internet").  I strongly encouraged anticoagulation.  He will think more about coumadin but does not think that he will restart it.  Hypertension  Hypertensive cardiomyopathy with severe LVH.  BP seems reasonably controlled, typically SBP < 150. He is no longer taking clonidine.  He is taking Coreg, lisinopril, and spironolactone.  Could consider of hydralazine in the future but not sure he would be compliant with it (cannot take amlodipine due to worsening swelling).  Suspect OSA contributes to HTN, but he does not want a sleep study.  I  emphasized to him the need for regular BMETs given spironolactone and lisinopril use.  He understands this.   Jesus [name] 06/09/2013 11:02 AM

## 2013-06-23 ENCOUNTER — Other Ambulatory Visit: Payer: Medicare HMO

## 2013-06-23 ENCOUNTER — Other Ambulatory Visit (HOSPITAL_COMMUNITY): Payer: Medicare HMO

## 2013-07-06 ENCOUNTER — Other Ambulatory Visit: Payer: Medicare HMO

## 2013-07-06 ENCOUNTER — Other Ambulatory Visit (HOSPITAL_COMMUNITY): Payer: Medicare HMO

## 2013-07-08 ENCOUNTER — Ambulatory Visit: Payer: Medicare HMO | Admitting: Vascular Surgery

## 2013-07-12 ENCOUNTER — Other Ambulatory Visit: Payer: Self-pay | Admitting: *Deleted

## 2013-07-12 DIAGNOSIS — I5032 Chronic diastolic (congestive) heart failure: Secondary | ICD-10-CM

## 2013-07-12 MED ORDER — POTASSIUM CHLORIDE CRYS ER 20 MEQ PO TBCR: EXTENDED_RELEASE_TABLET | ORAL | Status: DC

## 2013-07-14 ENCOUNTER — Other Ambulatory Visit: Payer: Self-pay

## 2013-07-14 DIAGNOSIS — I5032 Chronic diastolic (congestive) heart failure: Secondary | ICD-10-CM

## 2013-07-14 MED ORDER — SPIRONOLACTONE 25 MG PO TABS: 25.0000 mg | ORAL_TABLET | Freq: Every day | ORAL | Status: DC

## 2013-07-14 MED ORDER — CARVEDILOL 25 MG PO TABS: 25.0000 mg | ORAL_TABLET | Freq: Two times a day (BID) | ORAL | Status: DC

## 2013-07-14 MED ORDER — METOLAZONE 2.5 MG PO TABS: ORAL_TABLET | ORAL | Status: DC

## 2013-07-14 MED ORDER — TORSEMIDE 20 MG PO TABS: ORAL_TABLET | ORAL | Status: DC

## 2013-07-14 MED ORDER — LISINOPRIL 40 MG PO TABS: 20.0000 mg | ORAL_TABLET | Freq: Two times a day (BID) | ORAL | Status: DC

## 2013-07-14 MED ORDER — POTASSIUM CHLORIDE CRYS ER 20 MEQ PO TBCR: EXTENDED_RELEASE_TABLET | ORAL | Status: DC

## 2013-07-18 ENCOUNTER — Other Ambulatory Visit: Payer: Self-pay | Admitting: *Deleted

## 2013-07-18 DIAGNOSIS — I5032 Chronic diastolic (congestive) heart failure: Secondary | ICD-10-CM

## 2013-07-18 MED ORDER — TORSEMIDE 20 MG PO TABS: ORAL_TABLET | ORAL | Status: DC

## 2013-07-20 ENCOUNTER — Encounter: Payer: Self-pay | Admitting: Vascular Surgery

## 2013-07-21 ENCOUNTER — Ambulatory Visit: Payer: Medicare HMO | Admitting: Vascular Surgery

## 2013-07-21 ENCOUNTER — Encounter: Payer: Self-pay | Admitting: Vascular Surgery

## 2013-07-22 ENCOUNTER — Ambulatory Visit: Payer: Medicare HMO | Admitting: Vascular Surgery

## 2013-07-28 ENCOUNTER — Encounter: Payer: Self-pay | Admitting: Cardiology

## 2013-07-28 NOTE — Telephone Encounter (Deleted)
Error

## 2013-08-01 ENCOUNTER — Ambulatory Visit: Payer: Medicare HMO | Admitting: Physician Assistant

## 2013-08-25 ENCOUNTER — Encounter: Payer: Self-pay | Admitting: Vascular Surgery

## 2013-08-26 ENCOUNTER — Ambulatory Visit: Payer: Medicare HMO | Admitting: Vascular Surgery

## 2013-09-09 ENCOUNTER — Ambulatory Visit: Payer: Medicare HMO | Admitting: Vascular Surgery

## 2013-09-09 ENCOUNTER — Other Ambulatory Visit (HOSPITAL_COMMUNITY): Payer: Medicare HMO

## 2013-09-30 ENCOUNTER — Ambulatory Visit: Payer: Medicare HMO | Admitting: Vascular Surgery

## 2013-09-30 ENCOUNTER — Other Ambulatory Visit (HOSPITAL_COMMUNITY): Payer: Medicare HMO

## 2014-02-09 ENCOUNTER — Encounter: Payer: Self-pay | Admitting: Vascular Surgery

## 2014-02-10 ENCOUNTER — Encounter (HOSPITAL_COMMUNITY): Payer: Medicare HMO

## 2014-02-10 ENCOUNTER — Ambulatory Visit: Payer: Medicare HMO | Admitting: Vascular Surgery

## 2014-02-16 ENCOUNTER — Encounter: Payer: Self-pay | Admitting: Vascular Surgery

## 2014-02-17 ENCOUNTER — Encounter: Payer: Self-pay | Admitting: Vascular Surgery

## 2014-02-17 ENCOUNTER — Ambulatory Visit (INDEPENDENT_AMBULATORY_CARE_PROVIDER_SITE_OTHER): Payer: Medicare HMO | Admitting: Vascular Surgery

## 2014-02-17 VITALS — BP 173/85 | HR 104 | Ht 69.0 in | Wt 279.5 lb

## 2014-02-17 DIAGNOSIS — I779 Disorder of arteries and arterioles, unspecified: Secondary | ICD-10-CM | POA: Insufficient documentation

## 2014-02-17 DIAGNOSIS — I701 Atherosclerosis of renal artery: Secondary | ICD-10-CM | POA: Insufficient documentation

## 2014-02-17 DIAGNOSIS — I743 Embolism and thrombosis of arteries of the lower extremities: Secondary | ICD-10-CM

## 2014-02-17 DIAGNOSIS — I872 Venous insufficiency (chronic) (peripheral): Secondary | ICD-10-CM

## 2014-02-17 NOTE — Addendum Note (Signed)
Addended by: Fransisco HertzHEN, BRIAN L on: 02/17/2014 06:31 PM   Modules accepted: Level of Service

## 2014-02-17 NOTE — Progress Notes (Signed)
Referred by:  Gerre Pebbles, [name] [address] [place], [place] [address]  Reason for referral: abnormal CTA  History of Present Illness  Jesus [name] is a 68 y.o. (12/22/1945) male who presents with cc: re-establishing care.  This patient has previous been seen in this office for chronic venous insufficiency (C5) and mild PAD.  The patient denies any recent problems with his CVI and denies any rest pain or IC.  Apparently, he underwent recently extensive work in [place] including non-invasive studies and CTA.  He returns to evaluation of those results.  The patient denies any recent ulcers or any gangrene.  Past Medical History  Diagnosis Date  . Hypertension     Note: possible increased edema in past with CCBs, so these are avoided in this patient  . Morbid obesity   . H/O: GI bleed     "cause I took too much aspirin"  . Venous stasis ulcer   . Atrial fibrillation     First noted in 8/12, probably persistent  . Edema of lower extremity   . Chronic diastolic CHF (congestive heart failure) 12/2002    a. EF 50-55% by echo 04/2012  . Gout   . Hyperglycemia     Noted 05/2012    Past Surgical History  Procedure Laterality Date  . No past surgeries     History   Social History  . Marital Status: Divorced    Spouse Name: N/A    Number of Children: N/A  . Years of Education: N/A   Occupational History  . Retired    Social History Main Topics  . Smoking status: Former Smoker    Types: Cigarettes    Quit date: 12/08/1965  . Smokeless tobacco: Never Used     Comment: "social cigarette smoker in my late teens"  . Alcohol Use: 8.4 oz/week    14 Cans of beer per week  . Drug Use: No  . Sexual Activity: Not Currently   Other Topics Concern  . Not on file   Social History Narrative   Patient lives alone.   Family History  Problem Relation Age of Onset  . Arrhythmia Father   . Hypertension Father   . Cancer Mother     cervical   Current Outpatient  Prescriptions on File Prior to Visit  Medication Sig Dispense Refill  . carvedilol (COREG) 25 MG tablet Take 1 tablet (25 mg total) by mouth 2 (two) times daily with a meal.  180 tablet  3  . lisinopril (PRINIVIL,ZESTRIL) 40 MG tablet Take 0.5 tablets (20 mg total) by mouth 2 (two) times daily.  90 tablet  1  . metolazone (ZAROXOLYN) 2.5 MG tablet 1 tablet weekly on Fridays 30 minutes before you take torsemide  10 tablet  1  . potassium chloride SA (K-DUR,KLOR-CON) 20 MEQ tablet 2 tablets (total 40 mEq) on the day you take metolazone (zaroxlyn)  16 tablet  1  . torsemide (DEMADEX) 20 MG tablet 5 tablets (total 100mg ) at the same time in the morning  450 tablet  0  . colchicine 0.6 MG tablet Take 1 tablet (0.6 mg total) by mouth daily.  30 tablet  0  . spironolactone (ALDACTONE) 25 MG tablet Take 1 tablet (25 mg total) by mouth daily.  90 tablet  3   No current facility-administered medications on file prior to visit.   Allergies  Allergen Reactions  . Amlodipine Besylate Swelling    "started off low and ended up  severe; if, in fact, that is what's causing the swelling" (06/03/12)   REVIEW OF SYSTEMS:  (Positives checked otherwise negative)  CARDIOVASCULAR:  []  chest pain, []  chest pressure, []  palpitations, []  shortness of breath when laying flat, []  shortness of breath with exertion,  []  pain in feet when walking, []  pain in feet when laying flat, []  history of blood clot in veins (DVT), []  history of phlebitis, [x]  swelling in legs, []  varicose veins  PULMONARY:  []  productive cough, []  asthma, []  wheezing  NEUROLOGIC:  []  weakness in arms or legs, []  numbness in arms or legs, []  difficulty speaking or slurred speech, []  temporary loss of vision in one eye, []  dizziness  HEMATOLOGIC:  []  bleeding problems, []  problems with blood clotting too easily  MUSCULOSKEL:  []  joint pain, []  joint swelling  GASTROINTEST:  []  vomiting blood, []  blood in stool     GENITOURINARY:  []  burning with  urination, []  blood in urine  PSYCHIATRIC:  []  history of major depression  INTEGUMENTARY:  []  rashes, [x]  h/o venous ulcers  CONSTITUTIONAL:  []  fever, []  chills  For VQI Use Only  PRE-ADM LIVING: Home  AMB STATUS: Ambulatory  CAD Sx: None  PRIOR CHF: None  STRESS TEST: [x]  No, [ ]  Normal, [ ]  + ischemia, [ ]  + MI, [ ]  Both  Physical Examination  Filed Vitals:   02/17/14 0946  BP: 173/85  Pulse: 104  Height: 5\' 9"  (1.753 m)  Weight: 279 lb 8 oz (126.78 kg)  SpO2: 99%   Body mass index is 41.26 kg/(m^2).  General: A&O x 3, WD, morbidly obese  Head: [place]/AT  Ear/Nose/Throat: Hearing grossly intact, nares w/o erythema or drainage, oropharynx w/o Erythema/Exudate, Mallampati score: 3  Eyes: PERRLA, EOMI  Neck: Supple, no nuchal rigidity, no palpable LAD  Pulmonary: Sym exp, good air movt, CTAB, no rales, rhonchi, & wheezing  Cardiac: RRR, Nl S1, S2, no Murmurs, rubs or gallops  Vascular: Vessel Right Left  Radial Palpable Palpable  Brachial Palpable Palpable  Carotid Palpable, without bruit Palpable, without bruit  Aorta Not palpable N/A  Femoral Palpable Palpable  Popliteal Not palpable Not palpable  PT Palpable Palpable  DP Palpable Palpable   Gastrointestinal: soft, NTND, -G/R, - HSM, - masses, - CVAT B  Musculoskeletal: M/S 5/5 throughout , Extremities without ischemic changes , B LDS, edema 1+  Neurologic: CN 2-12 intact , Pain and light touch intact in extremities  Motor exam as listed above  Psychiatric: Judgment intact, Mood & affect appropriate for pt's clinical situation  Dermatologic: See M/S exam for extremity exam, no rashes otherwise noted  Lymph : No Cervical, Axillary, or Inguinal lymphadenopathy   Non-Invasive Vascular Imaging  Outside ABI (Date: 12/26/13)  R: 1.19  L: 0.83 I cannot review the images as I do not have access to the study.  Outside CTA (Date: 01/25/14)  No significant aortoiliac stenosis  50% narrowing mid  right renal artery  60-70% stenosis in the proximal left renal artery  50% narrowing in th eupper right popliteal artery.  Three vessel runoff to the right ankle.  50% narrowing in the distal left superficial femoral artery and 50% narrowing in the distal left popliteal artery.  Three vessel runoff to the left ankle.  2.8 cm cystic lesion in the uncinate processs of the pancreas.  MRCP is recommended. I cannot review the images as I do not have access to the study.  Outside Studies/Documentation 20 pages of outside documents were reviewed  including: outpatient clinic notes, recent CTA and non-invasive studies.  Medical Decision Making  Jesus [name] is a [age] y.o. male who presents with: BLE CVI (C5), asx BLE PAD, possible B RAS   I would continued with compressive therapy (20-30 mm Hg) for this patient's CVI.  The patient notes compliance has been spotty.  This patient CTA and non-invasive demonstrate multiple bed of atherosclerosis with already evidence of renal stenosis and BLE arterial stenosis.  The CORAL trial essentially has been the nail in the coffin for renal artery stenting, so except for definite cases of renovascular hypertension or kidney salvage, I no longer perform RA stenting prophylactically.  The PAD should be best managed medically and through lifestyle changes  I discussed in depth with the patient the nature of atherosclerosis, and emphasized the importance of maximal medical management including strict control of blood pressure, blood glucose, and lipid levels, antiplatelet agent, obtaining regular exercise, and cessation of smoking.    The patient is aware that without maximal medical management the underlying atherosclerotic disease process will progress, limiting the benefit of any interventions. The patient is currently on a statin: Lipitor. The patient is currently not on an anti-platelet.  I told the patient to start taking ASA 81 mg PO daily.  I would  continued with annual ABI in this patient to monitor the progression of his PAD.  Thank you for allowing us to participate in this patient's care.  Leonides SakeBrian Chen, MD [facility] Office: [phone] Pager: [phone]  02/17/2014, 6:21 PM

## 2014-02-20 NOTE — Addendum Note (Signed)
Addended by: Sharee PimpleMCCHESNEY, MARILYN K on: 02/20/2014 04:29 PM   Modules accepted: Orders

## 2014-02-22 ENCOUNTER — Encounter: Payer: Self-pay | Admitting: Family Medicine

## 2014-03-21 NOTE — Telephone Encounter (Signed)
This encounter was created in error - please disregard.

## 2014-07-28 ENCOUNTER — Ambulatory Visit: Payer: Medicare HMO | Admitting: Medical

## 2014-07-28 DIAGNOSIS — Z0289 Encounter for other administrative examinations: Secondary | ICD-10-CM

## 2014-08-24 ENCOUNTER — Encounter: Payer: Self-pay | Admitting: Vascular Surgery

## 2014-08-25 ENCOUNTER — Ambulatory Visit: Payer: Medicare HMO | Admitting: Vascular Surgery

## 2014-08-25 ENCOUNTER — Encounter (HOSPITAL_COMMUNITY): Payer: Medicare HMO

## 2014-12-11 DIAGNOSIS — L03116 Cellulitis of left lower limb: Secondary | ICD-10-CM | POA: Diagnosis not present

## 2014-12-11 DIAGNOSIS — I10 Essential (primary) hypertension: Secondary | ICD-10-CM | POA: Diagnosis not present

## 2014-12-11 DIAGNOSIS — L03115 Cellulitis of right lower limb: Secondary | ICD-10-CM | POA: Diagnosis not present

## 2014-12-11 DIAGNOSIS — I5030 Unspecified diastolic (congestive) heart failure: Secondary | ICD-10-CM | POA: Diagnosis not present

## 2014-12-13 DIAGNOSIS — L03115 Cellulitis of right lower limb: Secondary | ICD-10-CM | POA: Diagnosis not present

## 2014-12-13 DIAGNOSIS — I5030 Unspecified diastolic (congestive) heart failure: Secondary | ICD-10-CM | POA: Diagnosis not present

## 2014-12-13 DIAGNOSIS — L03116 Cellulitis of left lower limb: Secondary | ICD-10-CM | POA: Diagnosis not present

## 2014-12-13 DIAGNOSIS — I10 Essential (primary) hypertension: Secondary | ICD-10-CM | POA: Diagnosis not present

## 2014-12-15 DIAGNOSIS — L03115 Cellulitis of right lower limb: Secondary | ICD-10-CM | POA: Diagnosis not present

## 2014-12-15 DIAGNOSIS — I10 Essential (primary) hypertension: Secondary | ICD-10-CM | POA: Diagnosis not present

## 2014-12-15 DIAGNOSIS — I5030 Unspecified diastolic (congestive) heart failure: Secondary | ICD-10-CM | POA: Diagnosis not present

## 2014-12-15 DIAGNOSIS — L03116 Cellulitis of left lower limb: Secondary | ICD-10-CM | POA: Diagnosis not present

## 2014-12-18 DIAGNOSIS — L03116 Cellulitis of left lower limb: Secondary | ICD-10-CM | POA: Diagnosis not present

## 2014-12-18 DIAGNOSIS — I5030 Unspecified diastolic (congestive) heart failure: Secondary | ICD-10-CM | POA: Diagnosis not present

## 2014-12-18 DIAGNOSIS — L03115 Cellulitis of right lower limb: Secondary | ICD-10-CM | POA: Diagnosis not present

## 2014-12-18 DIAGNOSIS — I10 Essential (primary) hypertension: Secondary | ICD-10-CM | POA: Diagnosis not present

## 2014-12-19 DIAGNOSIS — I509 Heart failure, unspecified: Secondary | ICD-10-CM | POA: Diagnosis not present

## 2014-12-19 DIAGNOSIS — R600 Localized edema: Secondary | ICD-10-CM | POA: Diagnosis not present

## 2014-12-20 DIAGNOSIS — I517 Cardiomegaly: Secondary | ICD-10-CM | POA: Diagnosis not present

## 2014-12-20 DIAGNOSIS — I10 Essential (primary) hypertension: Secondary | ICD-10-CM | POA: Diagnosis not present

## 2014-12-20 DIAGNOSIS — I509 Heart failure, unspecified: Secondary | ICD-10-CM | POA: Diagnosis not present

## 2014-12-20 DIAGNOSIS — E780 Pure hypercholesterolemia: Secondary | ICD-10-CM | POA: Diagnosis not present

## 2014-12-20 DIAGNOSIS — J811 Chronic pulmonary edema: Secondary | ICD-10-CM | POA: Diagnosis not present

## 2014-12-21 DIAGNOSIS — I509 Heart failure, unspecified: Secondary | ICD-10-CM | POA: Diagnosis not present

## 2014-12-21 DIAGNOSIS — R0600 Dyspnea, unspecified: Secondary | ICD-10-CM | POA: Diagnosis not present

## 2014-12-22 DIAGNOSIS — L03116 Cellulitis of left lower limb: Secondary | ICD-10-CM | POA: Diagnosis not present

## 2014-12-22 DIAGNOSIS — L03115 Cellulitis of right lower limb: Secondary | ICD-10-CM | POA: Diagnosis not present

## 2014-12-22 DIAGNOSIS — I10 Essential (primary) hypertension: Secondary | ICD-10-CM | POA: Diagnosis not present

## 2014-12-22 DIAGNOSIS — I5030 Unspecified diastolic (congestive) heart failure: Secondary | ICD-10-CM | POA: Diagnosis not present

## 2014-12-25 DIAGNOSIS — I5030 Unspecified diastolic (congestive) heart failure: Secondary | ICD-10-CM | POA: Diagnosis not present

## 2014-12-25 DIAGNOSIS — I10 Essential (primary) hypertension: Secondary | ICD-10-CM | POA: Diagnosis not present

## 2014-12-25 DIAGNOSIS — L03116 Cellulitis of left lower limb: Secondary | ICD-10-CM | POA: Diagnosis not present

## 2014-12-25 DIAGNOSIS — L03115 Cellulitis of right lower limb: Secondary | ICD-10-CM | POA: Diagnosis not present

## 2014-12-27 DIAGNOSIS — I5030 Unspecified diastolic (congestive) heart failure: Secondary | ICD-10-CM | POA: Diagnosis not present

## 2014-12-27 DIAGNOSIS — I10 Essential (primary) hypertension: Secondary | ICD-10-CM | POA: Diagnosis not present

## 2014-12-27 DIAGNOSIS — L03115 Cellulitis of right lower limb: Secondary | ICD-10-CM | POA: Diagnosis not present

## 2014-12-27 DIAGNOSIS — L03116 Cellulitis of left lower limb: Secondary | ICD-10-CM | POA: Diagnosis not present

## 2014-12-30 DIAGNOSIS — I5030 Unspecified diastolic (congestive) heart failure: Secondary | ICD-10-CM | POA: Diagnosis not present

## 2014-12-30 DIAGNOSIS — I10 Essential (primary) hypertension: Secondary | ICD-10-CM | POA: Diagnosis not present

## 2014-12-30 DIAGNOSIS — L03115 Cellulitis of right lower limb: Secondary | ICD-10-CM | POA: Diagnosis not present

## 2014-12-30 DIAGNOSIS — L03116 Cellulitis of left lower limb: Secondary | ICD-10-CM | POA: Diagnosis not present

## 2015-01-01 DIAGNOSIS — I10 Essential (primary) hypertension: Secondary | ICD-10-CM | POA: Diagnosis not present

## 2015-01-01 DIAGNOSIS — I5030 Unspecified diastolic (congestive) heart failure: Secondary | ICD-10-CM | POA: Diagnosis not present

## 2015-01-01 DIAGNOSIS — L03116 Cellulitis of left lower limb: Secondary | ICD-10-CM | POA: Diagnosis not present

## 2015-01-01 DIAGNOSIS — L03115 Cellulitis of right lower limb: Secondary | ICD-10-CM | POA: Diagnosis not present

## 2015-01-02 DIAGNOSIS — I4891 Unspecified atrial fibrillation: Secondary | ICD-10-CM | POA: Diagnosis not present

## 2015-01-02 DIAGNOSIS — I739 Peripheral vascular disease, unspecified: Secondary | ICD-10-CM | POA: Diagnosis not present

## 2015-01-02 DIAGNOSIS — K922 Gastrointestinal hemorrhage, unspecified: Secondary | ICD-10-CM | POA: Diagnosis not present

## 2015-01-02 DIAGNOSIS — I5023 Acute on chronic systolic (congestive) heart failure: Secondary | ICD-10-CM | POA: Diagnosis not present

## 2015-01-02 DIAGNOSIS — R600 Localized edema: Secondary | ICD-10-CM | POA: Diagnosis not present

## 2015-01-02 DIAGNOSIS — I10 Essential (primary) hypertension: Secondary | ICD-10-CM | POA: Diagnosis not present

## 2015-01-02 DIAGNOSIS — I509 Heart failure, unspecified: Secondary | ICD-10-CM | POA: Diagnosis not present

## 2015-01-03 DIAGNOSIS — I10 Essential (primary) hypertension: Secondary | ICD-10-CM | POA: Diagnosis not present

## 2015-01-03 DIAGNOSIS — I5030 Unspecified diastolic (congestive) heart failure: Secondary | ICD-10-CM | POA: Diagnosis not present

## 2015-01-03 DIAGNOSIS — L03115 Cellulitis of right lower limb: Secondary | ICD-10-CM | POA: Diagnosis not present

## 2015-01-03 DIAGNOSIS — L03116 Cellulitis of left lower limb: Secondary | ICD-10-CM | POA: Diagnosis not present

## 2015-01-04 ENCOUNTER — Encounter (HOSPITAL_COMMUNITY): Payer: Self-pay | Admitting: Emergency Medicine

## 2015-01-04 ENCOUNTER — Emergency Department (HOSPITAL_COMMUNITY): Payer: Commercial Managed Care - HMO

## 2015-01-04 ENCOUNTER — Inpatient Hospital Stay (HOSPITAL_COMMUNITY)
Admission: EM | Admit: 2015-01-04 | Discharge: 2015-01-12 | DRG: 291 | Disposition: A | Discharge status: Home / Self Care | Payer: Commercial Managed Care - HMO | Attending: Internal Medicine | Admitting: Internal Medicine

## 2015-01-04 DIAGNOSIS — I5033 Acute on chronic diastolic (congestive) heart failure: Secondary | ICD-10-CM | POA: Diagnosis not present

## 2015-01-04 DIAGNOSIS — I5030 Unspecified diastolic (congestive) heart failure: Secondary | ICD-10-CM

## 2015-01-04 DIAGNOSIS — N508 Other specified disorders of male genital organs: Secondary | ICD-10-CM | POA: Diagnosis present

## 2015-01-04 DIAGNOSIS — I4891 Unspecified atrial fibrillation: Secondary | ICD-10-CM | POA: Diagnosis present

## 2015-01-04 DIAGNOSIS — Z888 Allergy status to other drugs, medicaments and biological substances status: Secondary | ICD-10-CM

## 2015-01-04 DIAGNOSIS — R32 Unspecified urinary incontinence: Secondary | ICD-10-CM | POA: Diagnosis present

## 2015-01-04 DIAGNOSIS — Z87891 Personal history of nicotine dependence: Secondary | ICD-10-CM

## 2015-01-04 DIAGNOSIS — M109 Gout, unspecified: Secondary | ICD-10-CM | POA: Diagnosis present

## 2015-01-04 DIAGNOSIS — I872 Venous insufficiency (chronic) (peripheral): Secondary | ICD-10-CM | POA: Diagnosis not present

## 2015-01-04 DIAGNOSIS — Z6841 Body Mass Index (BMI) 40.0 and over, adult: Secondary | ICD-10-CM

## 2015-01-04 DIAGNOSIS — Z7982 Long term (current) use of aspirin: Secondary | ICD-10-CM

## 2015-01-04 DIAGNOSIS — E871 Hypo-osmolality and hyponatremia: Secondary | ICD-10-CM | POA: Diagnosis not present

## 2015-01-04 DIAGNOSIS — I10 Essential (primary) hypertension: Secondary | ICD-10-CM | POA: Diagnosis not present

## 2015-01-04 DIAGNOSIS — I130 Hypertensive heart and chronic kidney disease with heart failure and stage 1 through stage 4 chronic kidney disease, or unspecified chronic kidney disease: Principal | ICD-10-CM | POA: Diagnosis present

## 2015-01-04 DIAGNOSIS — I517 Cardiomegaly: Secondary | ICD-10-CM | POA: Diagnosis not present

## 2015-01-04 DIAGNOSIS — I739 Peripheral vascular disease, unspecified: Secondary | ICD-10-CM | POA: Diagnosis not present

## 2015-01-04 DIAGNOSIS — I43 Cardiomyopathy in diseases classified elsewhere: Secondary | ICD-10-CM | POA: Diagnosis present

## 2015-01-04 DIAGNOSIS — R0989 Other specified symptoms and signs involving the circulatory and respiratory systems: Secondary | ICD-10-CM | POA: Insufficient documentation

## 2015-01-04 DIAGNOSIS — R609 Edema, unspecified: Secondary | ICD-10-CM | POA: Diagnosis not present

## 2015-01-04 DIAGNOSIS — I481 Persistent atrial fibrillation: Secondary | ICD-10-CM | POA: Diagnosis not present

## 2015-01-04 DIAGNOSIS — D649 Anemia, unspecified: Secondary | ICD-10-CM | POA: Diagnosis present

## 2015-01-04 DIAGNOSIS — E876 Hypokalemia: Secondary | ICD-10-CM | POA: Diagnosis present

## 2015-01-04 DIAGNOSIS — N189 Chronic kidney disease, unspecified: Secondary | ICD-10-CM | POA: Diagnosis present

## 2015-01-04 DIAGNOSIS — M170 Bilateral primary osteoarthritis of knee: Secondary | ICD-10-CM | POA: Diagnosis present

## 2015-01-04 DIAGNOSIS — I509 Heart failure, unspecified: Secondary | ICD-10-CM | POA: Diagnosis not present

## 2015-01-04 DIAGNOSIS — I482 Chronic atrial fibrillation: Secondary | ICD-10-CM | POA: Diagnosis not present

## 2015-01-04 DIAGNOSIS — I480 Paroxysmal atrial fibrillation: Secondary | ICD-10-CM | POA: Diagnosis not present

## 2015-01-04 DIAGNOSIS — Z602 Problems related to living alone: Secondary | ICD-10-CM | POA: Diagnosis present

## 2015-01-04 DIAGNOSIS — R600 Localized edema: Secondary | ICD-10-CM | POA: Insufficient documentation

## 2015-01-04 DIAGNOSIS — J811 Chronic pulmonary edema: Secondary | ICD-10-CM | POA: Diagnosis not present

## 2015-01-04 DIAGNOSIS — N179 Acute kidney failure, unspecified: Secondary | ICD-10-CM | POA: Diagnosis not present

## 2015-01-04 LAB — BASIC METABOLIC PANEL
ANION GAP: 8 (ref 5–15)
BUN: 6 mg/dL (ref 6–23)
CO2: 35 mmol/L — AB (ref 19–32)
Calcium: 8.8 mg/dL (ref 8.4–10.5)
Chloride: 88 mmol/L — ABNORMAL LOW (ref 96–112)
Creatinine, Ser: 0.93 mg/dL (ref 0.50–1.35)
GFR calc Af Amer: >90 mL/min (ref >90)
GFR calc non Af Amer: 84 mL/min — ABNORMAL LOW (ref >90)
Glucose, Bld: 106 mg/dL — ABNORMAL HIGH (ref 70–99)
Potassium: 3.2 mmol/L — ABNORMAL LOW (ref 3.5–5.1)
SODIUM: 131 mmol/L — AB (ref 135–145)

## 2015-01-04 LAB — CBC WITH DIFFERENTIAL/PLATELET
BASOS ABS: 0 10*3/uL (ref 0.0–0.1)
Basophils Relative: 0 % (ref 0–1)
Eosinophils Absolute: 0.6 10*3/uL (ref 0.0–0.7)
Eosinophils Relative: 8 % — ABNORMAL HIGH (ref 0–5)
HCT: 35.4 % — ABNORMAL LOW (ref 39.0–52.0)
Hemoglobin: 11.4 g/dL — ABNORMAL LOW (ref 13.0–17.0)
Lymphocytes Relative: 6 % — ABNORMAL LOW (ref 12–46)
Lymphs Abs: 0.4 10*3/uL — ABNORMAL LOW (ref 0.7–4.0)
MCH: 26 pg (ref 26.0–34.0)
MCHC: 32.2 g/dL (ref 30.0–36.0)
MCV: 80.8 fL (ref 78.0–100.0)
MONOS PCT: 12 % (ref 3–12)
Monocytes Absolute: 0.9 10*3/uL (ref 0.1–1.0)
NEUTROS ABS: 5.2 10*3/uL (ref 1.7–7.7)
Neutrophils Relative %: 74 % (ref 43–77)
Platelets: 294 10*3/uL (ref 150–400)
RBC: 4.38 MIL/uL (ref 4.22–5.81)
RDW: 14.7 % (ref 11.5–15.5)
WBC: 7.1 10*3/uL (ref 4.0–10.5)

## 2015-01-04 LAB — I-STAT TROPONIN, ED: TROPONIN I, POC: 0.01 ng/mL (ref 0.00–0.08)

## 2015-01-04 LAB — BRAIN NATRIURETIC PEPTIDE: B Natriuretic Peptide: 218.4 pg/mL — ABNORMAL HIGH (ref 0.0–100.0)

## 2015-01-04 MED ORDER — ASPIRIN EC 81 MG PO TBEC
81.0000 mg | DELAYED_RELEASE_TABLET | Freq: Every day | ORAL | Status: DC
  Administered 2015-01-05 – 2015-01-07 (×3): 81 mg via ORAL
  Filled 2015-01-04 (×4): qty 1

## 2015-01-04 MED ORDER — ACETAMINOPHEN 650 MG RE SUPP: 650.0000 mg | Freq: Four times a day (QID) | RECTAL | Status: DC | PRN

## 2015-01-04 MED ORDER — CARVEDILOL 25 MG PO TABS
25.0000 mg | ORAL_TABLET | Freq: Two times a day (BID) | ORAL | Status: DC
  Administered 2015-01-05 – 2015-01-08 (×7): 25 mg via ORAL
  Filled 2015-01-04 (×12): qty 1

## 2015-01-04 MED ORDER — FUROSEMIDE 10 MG/ML IJ SOLN
40.0000 mg | Freq: Once | INTRAMUSCULAR | Status: AC
  Administered 2015-01-04: 40 mg via INTRAVENOUS
  Filled 2015-01-04: qty 4

## 2015-01-04 MED ORDER — ONDANSETRON HCL 4 MG/2ML IJ SOLN: 4.0000 mg | Freq: Four times a day (QID) | INTRAMUSCULAR | Status: DC | PRN

## 2015-01-04 MED ORDER — COLCHICINE 0.6 MG PO TABS
0.6000 mg | ORAL_TABLET | Freq: Every day | ORAL | Status: DC
Start: 2015-01-05 — End: 2015-01-12
  Administered 2015-01-05 – 2015-01-12 (×6): 0.6 mg via ORAL
  Filled 2015-01-04 (×8): qty 1

## 2015-01-04 MED ORDER — POTASSIUM CHLORIDE CRYS ER 20 MEQ PO TBCR
20.0000 meq | EXTENDED_RELEASE_TABLET | Freq: Two times a day (BID) | ORAL | Status: DC
  Administered 2015-01-04 – 2015-01-05 (×2): 20 meq via ORAL
  Filled 2015-01-04 (×3): qty 1

## 2015-01-04 MED ORDER — ATORVASTATIN CALCIUM 80 MG PO TABS
80.0000 mg | ORAL_TABLET | Freq: Every day | ORAL | Status: DC
  Administered 2015-01-05 – 2015-01-10 (×6): 80 mg via ORAL
  Filled 2015-01-04 (×8): qty 1

## 2015-01-04 MED ORDER — ONDANSETRON HCL 4 MG PO TABS: 4.0000 mg | ORAL_TABLET | Freq: Four times a day (QID) | ORAL | Status: DC | PRN

## 2015-01-04 MED ORDER — HEPARIN SODIUM (PORCINE) 5000 UNIT/ML IJ SOLN
5000.0000 [IU] | Freq: Three times a day (TID) | INTRAMUSCULAR | Status: DC
  Filled 2015-01-04 (×5): qty 1

## 2015-01-04 MED ORDER — ACETAMINOPHEN 325 MG PO TABS: 650.0000 mg | ORAL_TABLET | Freq: Four times a day (QID) | ORAL | Status: DC | PRN

## 2015-01-04 MED ORDER — LISINOPRIL 20 MG PO TABS
20.0000 mg | ORAL_TABLET | Freq: Two times a day (BID) | ORAL | Status: DC
  Administered 2015-01-04 – 2015-01-07 (×7): 20 mg via ORAL
  Filled 2015-01-04 (×9): qty 1

## 2015-01-04 MED ORDER — SPIRONOLACTONE 25 MG PO TABS
25.0000 mg | ORAL_TABLET | Freq: Every day | ORAL | Status: DC
Start: 2015-01-05 — End: 2015-01-12
  Administered 2015-01-05 – 2015-01-12 (×8): 25 mg via ORAL
  Filled 2015-01-04 (×8): qty 1

## 2015-01-04 MED ORDER — DOCUSATE SODIUM 100 MG PO CAPS
100.0000 mg | ORAL_CAPSULE | Freq: Two times a day (BID) | ORAL | Status: DC
  Administered 2015-01-06 – 2015-01-11 (×6): 100 mg via ORAL
  Filled 2015-01-04 (×17): qty 1

## 2015-01-04 MED ORDER — SODIUM CHLORIDE 0.9 % IJ SOLN
3.0000 mL | Freq: Two times a day (BID) | INTRAMUSCULAR | Status: DC
  Administered 2015-01-04 – 2015-01-12 (×6): 3 mL via INTRAVENOUS

## 2015-01-04 MED ORDER — FUROSEMIDE 10 MG/ML IJ SOLN
80.0000 mg | Freq: Two times a day (BID) | INTRAMUSCULAR | Status: DC
  Administered 2015-01-05: 80 mg via INTRAVENOUS
  Filled 2015-01-04 (×3): qty 8

## 2015-01-04 NOTE — Progress Notes (Signed)
Pt refusing subQ heparin tonight. Pt states he will decide in the morning whether he wants it. Pt educated on importance of taking medicine and reason for medicine. Pt verbalized understanding.

## 2015-01-04 NOTE — ED Provider Notes (Signed)
CSN: [id number]     Arrival date & time 01/04/15  1827 History   First MD Initiated Contact with Patient 01/04/15 1908     Chief Complaint  Patient presents with  . Shortness of Breath     (Consider location/radiation/quality/duration/timing/severity/associated sxs/prior Treatment) HPI   This is a 69 year old male with a history of chronic diastolic heart failure. His primary care physician is Gerre Pebbles in [facility]. The patient is brought in by his daughter for one month of worsening swelling and shortness of breath. The patient states that he has had significantly worsening lower extremity edema and now is having scrotal swelling as well. He states that his scrotum is "the size of small cantaloupes." He states that this happens once every few years and he has to be admitted for diuresis. The patient takes 40 mg of Lasix 3 times daily. He states he has been doing this, however, has noticed significant weight gain. He is also having significant weeping from the bilateral lower extremities and his chronic venous insufficiency. Patient has had worsening shortness of breath with ambulation over the past month and it has progressively worsened. He denies any chest pain. Denies fevers, chills, myalgias, arthralgias. Denies DOE, SOB, chest tightness or pressure, radiation to left arm, jaw or back, or diaphoresis. Denies dysuria, flank pain, suprapubic pain, frequency, urgency, or hematuria. Denies headaches, light headedness, weakness, visual disturbances. Denies abdominal pain, nausea, vomiting, diarrhea or constipation.    Past Medical History  Diagnosis Date  . Hypertension   . Morbid obesity   . H/O: GI bleed     "cause I took too much aspirin"  . Venous stasis ulcer   . Atrial fibrillation     a. chronic   . Edema of lower extremity   . Chronic diastolic CHF (congestive heart failure) 12/2002    a. EF 50-55% by echo 04/2012  . Gout   . Hyperglycemia     Noted 05/2012    Past Surgical History  Procedure Laterality Date  . No past surgeries     Family History  Problem Relation Age of Onset  . Arrhythmia Father   . Hypertension Father   . Cancer Mother     cervical   History  Substance Use Topics  . Smoking status: Former Smoker    Types: Cigarettes    Quit date: 12/08/1965  . Smokeless tobacco: Never Used     Comment: "social cigarette smoker in my late teens"  . Alcohol Use: 8.4 oz/week    14 Cans of beer per week    Review of Systems  Ten systems reviewed and are negative for acute change, except as noted in the HPI.    Allergies  Amlodipine besylate  Home Medications   Prior to Admission medications   Medication Sig Start Date End Date Taking? Authorizing Provider  atorvastatin (LIPITOR) 80 MG tablet Take 80 mg by mouth daily.   Yes Historical Provider, MD  carvedilol (COREG) 25 MG tablet Take 1 tablet (25 mg total) by mouth 2 (two) times daily with a meal. 07/14/13  Yes Laurey Morale, MD  lisinopril (PRINIVIL,ZESTRIL) 40 MG tablet Take 0.5 tablets (20 mg total) by mouth 2 (two) times daily. Patient taking differently: Take 60 mg by mouth 2 (two) times daily.  07/14/13  Yes Laurey Morale, MD  metolazone (ZAROXOLYN) 2.5 MG tablet 1 tablet weekly on Fridays 30 minutes before you take torsemide 07/14/13  Yes Laurey Morale, MD  pantoprazole (PROTONIX) 40  MG tablet Take 40 mg by mouth daily.   Yes Historical Provider, MD  potassium chloride SA (K-DUR,KLOR-CON) 20 MEQ tablet 2 tablets (total 40 mEq) on the day you take metolazone (zaroxlyn) 07/14/13  Yes Laurey Morale, MD  torsemide (DEMADEX) 20 MG tablet 5 tablets (total ) at the same time in the morning 07/18/13  Yes Laurey Morale, MD  colchicine 0.6 MG tablet Take 1 tablet (0.6 mg total) by mouth daily. Patient not taking: Reported on 01/05/2015 06/11/12 06/11/13  Dayna N Dunn, [name]  spironolactone (ALDACTONE) 25 MG tablet Take 1 tablet (25 mg total) by mouth daily. Patient not  taking: Reported on 01/05/2015 07/14/13 07/14/14  Laurey Morale, MD   BP 107/53 mmHg  Pulse 68  Temp(Src) 97.9 F (36.6 C) (Oral)  Resp 18  Ht  (1.778 m)  Wt 292 lb 15.9 oz (132.9 kg)  BMI 42.04 kg/m2  SpO2 99% Physical Exam  Constitutional: He is oriented to person, place, and time. He appears well-developed and well-nourished. No distress.  HENT:  Head: Normocephalic and atraumatic.  Eyes: Conjunctivae are normal. No scleral icterus.  Neck: Normal range of motion. Neck supple.  Cardiovascular: Normal rate, regular rhythm and normal heart sounds.   Bilateral significant peripheral edema, weeping legs, chronic venous stasis dermatitis.  Pulmonary/Chest: Effort normal. No respiratory distress. He has rales (crackles heard in the bilateral lung bases).  Abdominal: Soft. There is no tenderness.  Genitourinary:  Significant swelling of the bilateral scrotum. Enlarged(size of 2 small grapefruits) . No signs of cellulitis.  Musculoskeletal: He exhibits no edema.  Neurological: He is alert and oriented to person, place, and time.  Skin: Skin is warm and dry. He is not diaphoretic.  Psychiatric: His behavior is normal.  Nursing note and vitals reviewed.   ED Course  Procedures (including critical care time) Labs Review Labs Reviewed  BASIC METABOLIC PANEL - Abnormal; Notable for the following:    Sodium 131 (*)    Potassium 3.2 (*)    Chloride 88 (*)    CO2 35 (*)    Glucose, Bld 106 (*)    GFR calc non Af Amer 84 (*)    All other components within normal limits  BRAIN NATRIURETIC PEPTIDE - Abnormal; Notable for the following:    B Natriuretic Peptide 218.4 (*)    All other components within normal limits  CBC WITH DIFFERENTIAL/PLATELET - Abnormal; Notable for the following:    Hemoglobin 11.4 (*)    HCT 35.4 (*)    Lymphocytes Relative 6 (*)    Lymphs Abs 0.4 (*)    Eosinophils Relative 8 (*)    All other components within normal limits  BASIC METABOLIC PANEL -  Abnormal; Notable for the following:    Sodium 133 (*)    Potassium 3.0 (*)    Chloride 90 (*)    CO2 37 (*)    Glucose, Bld 112 (*)    GFR calc non Af Amer 85 (*)    All other components within normal limits  CBC WITH DIFFERENTIAL/PLATELET - Abnormal; Notable for the following:    Hemoglobin 11.0 (*)    HCT 34.2 (*)    MCH 25.9 (*)    Lymphocytes Relative 7 (*)    Lymphs Abs 0.5 (*)    Monocytes Relative 13 (*)    Eosinophils Relative 7 (*)    All other components within normal limits  IRON AND TIBC - Abnormal; Notable for the following:  Iron 26 (*)    Saturation Ratios 8 (*)    All other components within normal limits  BASIC METABOLIC PANEL - Abnormal; Notable for the following:    Sodium 133 (*)    Potassium 3.3 (*)    Chloride 90 (*)    CO2 35 (*)    Glucose, Bld 105 (*)    Creatinine, Ser 1.43 (*)    GFR calc non Af Amer 49 (*)    GFR calc Af Amer 57 (*)    All other components within normal limits  BASIC METABOLIC PANEL - Abnormal; Notable for the following:    Sodium 132 (*)    Chloride 88 (*)    CO2 35 (*)    Glucose, Bld 123 (*)    Creatinine, Ser 1.55 (*)    GFR calc non Af Amer 44 (*)    GFR calc Af Amer 51 (*)    All other components within normal limits  BASIC METABOLIC PANEL - Abnormal; Notable for the following:    Sodium 132 (*)    Chloride 85 (*)    CO2 37 (*)    Glucose, Bld 186 (*)    BUN 26 (*)    Creatinine, Ser 1.82 (*)    GFR calc non Af Amer 36 (*)    GFR calc Af Amer 42 (*)    All other components within normal limits  BASIC METABOLIC PANEL - Abnormal; Notable for the following:    Sodium 131 (*)    Chloride 84 (*)    CO2 35 (*)    Glucose, Bld 146 (*)    BUN 30 (*)    Creatinine, Ser 1.77 (*)    GFR calc non Af Amer 38 (*)    GFR calc Af Amer 44 (*)    All other components within normal limits  CBC - Abnormal; Notable for the following:    Hemoglobin 11.0 (*)    HCT 34.8 (*)    MCH 25.7 (*)    All other components within  normal limits  BASIC METABOLIC PANEL - Abnormal; Notable for the following:    Sodium 130 (*)    Potassium 3.3 (*)    Chloride 81 (*)    CO2 39 (*)    Glucose, Bld 111 (*)    BUN 34 (*)    Creatinine, Ser 1.56 (*)    GFR calc non Af Amer 44 (*)    GFR calc Af Amer 51 (*)    All other components within normal limits  GLUCOSE, CAPILLARY - Abnormal; Notable for the following:    Glucose-Capillary 168 (*)    All other components within normal limits  GLUCOSE, CAPILLARY - Abnormal; Notable for the following:    Glucose-Capillary 116 (*)    All other components within normal limits  GLUCOSE, CAPILLARY - Abnormal; Notable for the following:    Glucose-Capillary 183 (*)    All other components within normal limits  GLUCOSE, CAPILLARY - Abnormal; Notable for the following:    Glucose-Capillary 108 (*)    All other components within normal limits  BASIC METABOLIC PANEL - Abnormal; Notable for the following:    Sodium 128 (*)    Chloride 80 (*)    CO2 33 (*)    Glucose, Bld 171 (*)    BUN 39 (*)    Creatinine, Ser 1.52 (*)    GFR calc non Af Amer 45 (*)    GFR calc Af Amer 53 (*)  All other components within normal limits  GLUCOSE, CAPILLARY - Abnormal; Notable for the following:    Glucose-Capillary 170 (*)    All other components within normal limits  FERRITIN  I-STAT TROPOININ, ED  I-STAT TROPOININ, ED    Imaging Review No results found.   EKG Interpretation   Date/Time:  Thursday January 04 2015 18:53:19 EST Ventricular Rate:  79 PR Interval:    QRS Duration: 104 QT Interval:  412 QTC Calculation: 472 R Axis:   9 Text Interpretation:  Atrial fibrillation Inferior infarct , age  undetermined T wave abnormality, consider lateral ischemia or digitalis  effect Abnormal ECG ED PHYSICIAN INTERPRETATION AVAILABLE IN [facility] Confirmed by TEST, Record ([id number]) on 01/06/2015 10:00:27 AM      MDM   Final diagnoses:  Diastolic congestive heart failure,  unspecified congestive heart failure chronicity  Peripheral edema  Pulmonary vascular congestion    Patient here with severe peripheral edema secondary to his heart failure. I have ordered 40 mg of IV Lasix. His EKG showed chronic atrial fibrillation. It did not see the patient takes in a coagulation therapy.  Patient with pulmonary vascular congestion . Pro bnp does not appear elevated however the patient appears to be in chf.  Hypokalemia, repleted orally. Patient will be admitted .       Arthor Captain, [name] 01/11/15 1556  Richardean Canal, MD 01/11/15 [phone]

## 2015-01-04 NOTE — ED Notes (Signed)
Report attempted 

## 2015-01-04 NOTE — Progress Notes (Signed)
Pt arrived to floor in NAD, pt A/Ox4, VSS. Pt steady on feet, independent at home and no history of falls. Pt oriented to room and floor and updated on plan of care. Pt requesting to be hooked up to an IV to get the fluid off of his legs. Pt educated that he will be taking IV lasix twice a day to help with fluid. Pt also educated on importance of measuring output. Will continue to monitor. Huel Coventryosenberger, Diamone Whistler A, RN

## 2015-01-04 NOTE — H&P (Signed)
[facility] History and Physical  Jesus [name] ZHY:[id number] DOB: 1946-03-21 DOA: 01/04/2015  Referring physician: ED physician PCP: [name],[name], [name]   Chief Complaint: Swelling  HPI:  [name] is a 69yo man with PMH of Afib (not on coumadin), diastolic CHF, HTN, PVD, CAD who presents for swelling.  [name] notes that in the last month he has had a progressive swelling that starts in his legs and progresses upwards.  He now has scrotal swelling and weeping from his legs.  He has had something like this before about 2 years ago.  He weighs himself daily and has noted a 20 pound weight gain.  He sleeps in a recliner, so does not have orthopnea.  He denies PND or chest pain.  He denies DOE to me. He does report a dull sense of fullness in his legs and occasional lightheadedness.  He has been taking all of his medications as prescribed.  He reports being on Lasix 40mg  TID though his med rec notes torsemide and zaroxolyn.   His last visit with Cardiology was about 1.5 years ago.  Per that note, he is not on anticoagulation due to personal choice.  He was on torsemide and metolazone at that time and did not follow up with them.   Assessment and Plan: Acute on chronic diastolic CHF (congestive heart failure) - BNP only mildly elevated - He reports being on lasix, it is not clear if he is following with a cardiologist at this time - Admit to telemetry - Lasix 80mg  BID, monitor renal function  - strict I/O, daily weight - EKG with atrial fibrillation, he denies chest pain, TnI X 1 negative  - Consider HF consult in the AM - Repeat TTE ordered (last in 2013, EF normal but severe LVH) - Continue beta blocker, ace-I, statin - Consider starting baby aspirin  Hypokalemia - Replete orally - Check daily, BID if needed while on high dose lasix  Atrial fibrillation - Not on coumadin due to personal choice - Coreg continued as it is a chronic medications - Telemetry     Hypertension - Continuing home medications including coreg, lisinopril, diuretics - Monitor renal function    Chronic venous insufficiency - Wound care in the AM, consider UNNA boots  Normocytic anemia - Check iron panel, MCV borderline low.   Radiological Exams on Admission: Dg Chest 2 View  01/04/2015   CLINICAL DATA:  Chronic shortness of breath. Lower extremity swelling. Congestive heart failure.  EXAM: CHEST  2 VIEW  COMPARISON:  None.  FINDINGS: Mild to moderate cardiomegaly noted as well as ectasia of the thoracic aorta. Pulmonary vascular congestion is seen, however there is no evidence of frank pulmonary edema or focal infiltrate. No evidence of pleural effusion. Old bilateral rib fracture deformities incidentally noted.  IMPRESSION: Cardiomegaly and pulmonary vascular congestion.  No acute findings.   Electronically Signed   By: Jesus RosenthalJohn  [name] M.D.   On: 01/04/2015 19:34   Code Status: Full Family Communication: Pt at bedside Disposition Plan: Admit for further evaluation    Review of Systems:  Constitutional: Negative for fever, chills and malaise/fatigue. Negative for diaphoresis.  HENT: Negative for hearing loss, ear pain, nosebleeds Eyes: Negative for blurred vision, double vision Respiratory: Negative for cough, hemoptysis, sputum production, shortness of breath, wheezing  Cardiovascular: + for peripheral edema, scrotal swelling. Negative for chest pain, palpitations, orthopnea, claudication  Gastrointestinal: Negative for nausea, vomiting and abdominal pain. Negative for heartburn, constipation, blood in stool and melena.  Genitourinary: +  scrotal swelling. Negative for dysuria, urgency, frequency  Musculoskeletal: Negative for myalgias, back pain, joint pain and falls.  Skin: Negative for itching and rash.  Neurological: Negative for dizziness and weakness. Negative for tingling, tremors, sensory change, speech change, focal weakness, loss of consciousness and headaches.   Endo/Heme/Allergies: Negative for environmental allergies and polydipsia. Does not bruise/bleed easily.  Psychiatric/Behavioral: The patient is not nervous/anxious.      Past Medical History  Diagnosis Date  . Hypertension     Note: possible increased edema in past with CCBs, so these are avoided in this patient  . Morbid obesity   . H/O: GI bleed     "cause I took too much aspirin"  . Venous stasis ulcer   . Atrial fibrillation     First noted in 8/12, probably persistent  . Edema of lower extremity   . Chronic diastolic CHF (congestive heart failure) 12/2002    a. EF 50-55% by echo 04/2012  . Gout   . Hyperglycemia     Noted 05/2012    Past Surgical History  Procedure Laterality Date  . No past surgeries      Social History:  reports that he quit smoking about 49 years ago. His smoking use included Cigarettes. He has never used smokeless tobacco. He reports that he drinks about 8.4 oz of alcohol per week. He reports that he does not use illicit drugs.  Allergies  Allergen Reactions  . Amlodipine Besylate Swelling    "started off low and ended up severe; if, in fact, that is what's causing the swelling" (06/03/12)    Family History  Problem Relation Age of Onset  . Arrhythmia Father   . Hypertension Father   . Cancer Mother     cervical    Prior to Admission medications   Medication Sig Start Date End Date Taking? Authorizing Provider  atorvastatin (LIPITOR) 80 MG tablet Take 80 mg by mouth daily at 6 PM.  11/29/13 11/29/14  Historical Provider, MD  carvedilol (COREG) 25 MG tablet Take 1 tablet (25 mg total) by mouth 2 (two) times daily with a meal. 07/14/13   Laurey Morale, MD  colchicine 0.6 MG tablet Take 1 tablet (0.6 mg total) by mouth daily. 06/11/12 06/11/13  Dayna N Dunn, [name]  lisinopril (PRINIVIL,ZESTRIL) 20 MG tablet Take 20 mg by mouth. 11/29/13 11/29/14  Historical Provider, MD  lisinopril (PRINIVIL,ZESTRIL) 40 MG tablet Take 0.5 tablets (20 mg total) by mouth  2 (two) times daily. 07/14/13   Laurey Morale, MD  metolazone (ZAROXOLYN) 2.5 MG tablet 1 tablet weekly on Fridays 30 minutes before you take torsemide 07/14/13   Laurey Morale, MD  potassium chloride SA (K-DUR,KLOR-CON) 20 MEQ tablet 2 tablets (total 40 mEq) on the day you take metolazone (zaroxlyn) 07/14/13   Laurey Morale, MD  spironolactone (ALDACTONE) 25 MG tablet Take 1 tablet (25 mg total) by mouth daily. 07/14/13 07/14/14  Laurey Morale, MD  torsemide (DEMADEX) 20 MG tablet 5 tablets (total ) at the same time in the morning 07/18/13   Laurey Morale, MD    Physical Exam: Filed Vitals:   01/04/15 2115 01/04/15 2130 01/04/15 2200 01/04/15 2243  BP: 129/67 143/69 143/81 147/70  Pulse: 80 88 89 69  Temp:    98.8 F (37.1 C)  TempSrc:    Oral  Resp: Height:     (1.778 m)  Weight:    298 lb 11.6 oz (135.5  kg)  SpO2: 97% 98% 97% 98%    Physical Exam  Constitutional: Appears well-developed and well-nourished. No distress.  HENT: Normocephalic.  Oropharynx is clear and moist.  Eyes: Conjunctivae normal. no scleral icterus.  Neck: Normal ROM. Neck supple. + JVD. CVS: RR, NR, S1/S2 +, no murmurs Pulmonary: Effort and breath sounds normal, no rhonchi, wheezes, rales.  Abdominal: Soft. BS +,  no distension, tenderness Musculoskeletal: + LE edema to knees, + weeping wounds in LE bilaterally, + changes of chronic stasis to mid calf bilaterally.  + dry skin on feet.   GU: + significant scrotal swelling.  Neuro: Alert. Grossly normal.  Skin: Skin on LE is moist, weeping clear fluid, no apparent infections.  Skin is chronically changed with dark patches and pink moist patches.   Psychiatric: Normal mood and affect. Behavior, judgment, thought content normal.   Labs on Admission:  Basic Metabolic Panel:  Recent Labs Lab 01/04/15 1853  NA 131*  K 3.2*  CL 88*  CO2 35*  GLUCOSE 106*  BUN 6  CREATININE 0.93  CALCIUM 8.8   CBC:  Recent Labs Lab  01/04/15 1853  WBC 7.1  NEUTROABS 5.2  HGB 11.4*  HCT 35.4*  MCV 80.8  PLT 294    EKG: Atrial fibrillation, no acute ST changes or TW changes.   Debe Coder, MD  [phone]  If 7PM-7AM, please contact night-coverage [email or web address] Password [email or web address] 01/05/2015, 1:23 AM

## 2015-01-04 NOTE — ED Notes (Signed)
Pt here with hx of CHF, fluid overload and SOB; pt sts hx of same; pt with swollen testicles and feet and legs with wheeping wounds

## 2015-01-05 ENCOUNTER — Encounter (HOSPITAL_COMMUNITY): Payer: Self-pay | Admitting: Urology

## 2015-01-05 DIAGNOSIS — I5033 Acute on chronic diastolic (congestive) heart failure: Secondary | ICD-10-CM

## 2015-01-05 DIAGNOSIS — I481 Persistent atrial fibrillation: Secondary | ICD-10-CM

## 2015-01-05 LAB — BASIC METABOLIC PANEL
ANION GAP: 6 (ref 5–15)
BUN: 8 mg/dL (ref 6–23)
CO2: 37 mmol/L — ABNORMAL HIGH (ref 19–32)
Calcium: 8.6 mg/dL (ref 8.4–10.5)
Chloride: 90 mmol/L — ABNORMAL LOW (ref 96–112)
Creatinine, Ser: 0.91 mg/dL (ref 0.50–1.35)
GFR calc non Af Amer: 85 mL/min — ABNORMAL LOW (ref >90)
GLUCOSE: 112 mg/dL — AB (ref 70–99)
POTASSIUM: 3 mmol/L — AB (ref 3.5–5.1)
SODIUM: 133 mmol/L — AB (ref 135–145)

## 2015-01-05 LAB — CBC WITH DIFFERENTIAL/PLATELET
Basophils Absolute: 0 10*3/uL (ref 0.0–0.1)
Basophils Relative: 0 % (ref 0–1)
Eosinophils Absolute: 0.5 10*3/uL (ref 0.0–0.7)
Eosinophils Relative: 7 % — ABNORMAL HIGH (ref 0–5)
HEMATOCRIT: 34.2 % — AB (ref 39.0–52.0)
HEMOGLOBIN: 11 g/dL — AB (ref 13.0–17.0)
LYMPHS PCT: 7 % — AB (ref 12–46)
Lymphs Abs: 0.5 10*3/uL — ABNORMAL LOW (ref 0.7–4.0)
MCH: 25.9 pg — ABNORMAL LOW (ref 26.0–34.0)
MCHC: 32.2 g/dL (ref 30.0–36.0)
MCV: 80.5 fL (ref 78.0–100.0)
MONO ABS: 0.9 10*3/uL (ref 0.1–1.0)
Monocytes Relative: 13 % — ABNORMAL HIGH (ref 3–12)
Neutro Abs: 5 10*3/uL (ref 1.7–7.7)
Neutrophils Relative %: 73 % (ref 43–77)
PLATELETS: 294 10*3/uL (ref 150–400)
RBC: 4.25 MIL/uL (ref 4.22–5.81)
RDW: 15 % (ref 11.5–15.5)
WBC: 6.8 10*3/uL (ref 4.0–10.5)

## 2015-01-05 LAB — IRON AND TIBC
IRON: 26 ug/dL — AB (ref 42–165)
Saturation Ratios: 8 % — ABNORMAL LOW (ref 20–55)
TIBC: 332 ug/dL (ref 215–435)
UIBC: 306 ug/dL (ref 125–400)

## 2015-01-05 LAB — FERRITIN: FERRITIN: 69 ng/mL (ref 22–322)

## 2015-01-05 MED ORDER — POTASSIUM CHLORIDE CRYS ER 20 MEQ PO TBCR
20.0000 meq | EXTENDED_RELEASE_TABLET | Freq: Once | ORAL | Status: AC
  Administered 2015-01-05: 20 meq via ORAL
  Filled 2015-01-05: qty 1

## 2015-01-05 MED ORDER — APIXABAN 5 MG PO TABS
5.0000 mg | ORAL_TABLET | Freq: Two times a day (BID) | ORAL | Status: DC
  Administered 2015-01-05 – 2015-01-12 (×15): 5 mg via ORAL
  Filled 2015-01-05 (×16): qty 1

## 2015-01-05 MED ORDER — POTASSIUM CHLORIDE CRYS ER 20 MEQ PO TBCR
40.0000 meq | EXTENDED_RELEASE_TABLET | Freq: Two times a day (BID) | ORAL | Status: DC
  Administered 2015-01-05 – 2015-01-12 (×15): 40 meq via ORAL
  Filled 2015-01-05 (×19): qty 2

## 2015-01-05 MED ORDER — METOLAZONE 5 MG PO TABS
5.0000 mg | ORAL_TABLET | Freq: Two times a day (BID) | ORAL | Status: DC
  Administered 2015-01-05 – 2015-01-11 (×14): 5 mg via ORAL
  Filled 2015-01-05 (×16): qty 1

## 2015-01-05 MED ORDER — FUROSEMIDE 10 MG/ML IJ SOLN
15.0000 mg/h | INTRAMUSCULAR | Status: DC
  Administered 2015-01-05 – 2015-01-11 (×7): 15 mg/h via INTRAVENOUS
  Filled 2015-01-05 (×20): qty 25

## 2015-01-05 MED ORDER — IBUPROFEN 800 MG PO TABS
800.0000 mg | ORAL_TABLET | Freq: Once | ORAL | Status: AC
  Administered 2015-01-05: 800 mg via ORAL
  Filled 2015-01-05: qty 1

## 2015-01-05 NOTE — Progress Notes (Signed)
Patient ID: Jesus [name]  male  NFA:[id number]    DOB: 10/22/1946    DOA: 01/04/2015  PCP: Miki Kins  Primary cardiologist: Dr. Shirlee Latch  Brief history of present illness  Patient is a 69 year old male with atrial fibrillation not on Coumadin, diastolic CHF, hypertension, PVD, CAD presented with peripheral edema and gaining of weight, 20 pounds. Patient noted that in the last month he has had progressive swelling that started in his legs and progressively upwards, now has scrotal swelling and weeping from his legs. Patient has been weighing himself daily and noted a 20 pound weight gain. He sleeps in a recliner, denies chest pain or dyspnea on exertion. Patient reports that he has been taking his medications. However he has not followed up with cardiology since last year as he moved to [place], [place].  Assessment/Plan: Principal Problem:   Acute on chronic diastolic CHF (congestive heart failure) - Continue strict I's and O's, daily weights, placed on IV diuresis. - Follow 2-D echo, wound care also consulted, will need Unna boots - Continue aspirin, beta blocker, statin - Cardiology consult called  - Lives alone, will benefit from [facility], needs compliance with cardiology appointments.  Active Problems:   Atrial fibrillation - Currently heart rate controlled, continue beta blocker. - CHADS Vasc 3.  - Per office notes, Dr. Shirlee Latch has discussed regarding anticoagulation in the past, patient does not want to be on anticoagulation. He was on Coumadin prior however did not want to take Coumadin anymore and does not want to be on NOAC either.    Hypertension With hypertensive cardiomyopathy, severe LVH - Continue beta blocker, lisinopril, spironolactone, Lasix    Morbid obesity - Patient counseled on diet and weight control    Peripheral vascular disease - Continue aspirin    Chronic venous insufficiency -wound Care consulted   DVT Prophylaxis:Heparin subcutaneous    Code Status:Full code   Family Communication:  Disposition:  Consultants:  Cardiology   Procedures:  None   Antibiotics:  none    Subjective: Patient seen and examined, significant peripheral edema, weeping with edema up to scrotum, no chest pain, fevers or chills or coughing   Objective: Weight change:   Intake/Output Summary (Last 24 hours) at 01/05/15 1141 Last data filed at 01/05/15 0839  Gross per 24 hour  Intake    980 ml  Output    450 ml  Net    530 ml   Blood pressure 126/59, pulse 60, temperature 98.2 F (36.8 C), temperature source Oral, resp. rate 18, height  (1.778 m), weight 135.5 kg (298 lb 11.6 oz), SpO2 97 %.  Physical Exam: General: Alert and awake, oriented x3, not in any acute distress. CVS: S1-S2 clear, no murmur rubs or gallops Chest: Decreased breath sounds at the bases Abdomen: soft nontender, nondistended, normal bowel sounds  Extremities: no cyanosis, clubbing. Scrotal edema with 3+ pitting edema, weeping Neuro: Cranial nerves II-XII intact, no focal neurological deficits  Lab Results: Basic Metabolic Panel:  Recent Labs Lab 01/04/15 1853 01/05/15 0425  NA 131* 133*  K 3.2* 3.0*  CL 88* 90*  CO2 35* 37*  GLUCOSE 106* 112*  BUN 6 8  CREATININE 0.93 0.91  CALCIUM 8.8 8.6   Liver Function Tests: No results for input(s): AST, ALT, ALKPHOS, BILITOT, PROT, ALBUMIN in the last 168 hours. No results for input(s): LIPASE, AMYLASE in the last 168 hours. No results for input(s): AMMONIA in the last 168 hours. CBC:  Recent Labs Lab 01/04/15 1853  01/05/15 0425  WBC 7.1 6.8  NEUTROABS 5.2 5.0  HGB 11.4* 11.0*  HCT 35.4* 34.2*  MCV 80.8 80.5  PLT 294 294   Cardiac Enzymes: No results for input(s): CKTOTAL, CKMB, CKMBINDEX, TROPONINI in the last 168 hours. BNP: Invalid input(s): POCBNP CBG: No results for input(s): GLUCAP in the last 168 hours.   Micro Results: No results found for this or any previous visit  (from the past 240 hour(s)).  Studies/Results: Dg Chest 2 View  01/04/2015   CLINICAL DATA:  Chronic shortness of breath. Lower extremity swelling. Congestive heart failure.  EXAM: CHEST  2 VIEW  COMPARISON:  None.  FINDINGS: Mild to moderate cardiomegaly noted as well as ectasia of the thoracic aorta. Pulmonary vascular congestion is seen, however there is no evidence of frank pulmonary edema or focal infiltrate. No evidence of pleural effusion. Old bilateral rib fracture deformities incidentally noted.  IMPRESSION: Cardiomegaly and pulmonary vascular congestion.  No acute findings.   Electronically Signed   By: Myles RosenthalJohn  Stahl M.D.   On: 01/04/2015 19:34    Medications: Scheduled Meds: . aspirin EC  81 mg Oral Daily  . atorvastatin  80 mg Oral q1800  . carvedilol  25 mg Oral BID WC  . colchicine  0.6 mg Oral Daily  . docusate sodium  100 mg Oral BID  . furosemide  80 mg Intravenous Q12H  . heparin  5,000 Units Subcutaneous 3 times per day  . lisinopril  20 mg Oral BID  . potassium chloride SA  20 mEq Oral BID  . sodium chloride  3 mL Intravenous Q12H  . spironolactone  25 mg Oral Daily   Time spent 25 minutes   LOS: 1 day   Telitha Plath M.D. [facility] 01/05/2015, 11:41 AM Pager: [phone]  If 7PM-7AM, please contact night-coverage [email or web address] Password [email or web address]

## 2015-01-05 NOTE — Progress Notes (Addendum)
Pt still requesting to be hooked to an IV to get the fluid off of his body. Pt asking to speak to the on call doctor and requesting that on call doctor visits him to see how much fluid he has on his body. Pt states that he is concerned that he is not voiding as much as he should be. Pt states he could be taking pills at home if that is all that he is getting in the hospital. Pt informed that he is receiving IV lasix here at the hospital.   K Schorr paged via amion textpage, pt has had 2 incontinent episodes since first dose of lasix, and 450ml output since second dose on floor. Pt refusing to have RN assist him in elevating legs and use scrotal support at this time. Pt sitting in recliner with feet on floor. Will continue to monitor Jesus Coventryosenberger, Jesus Wahba A, RN

## 2015-01-05 NOTE — Consult Note (Signed)
CARDIOLOGY CONSULT NOTE   Patient ID: Jesus [name] MRN: [id number] DOB/AGE: [birth year] 69 y.o.  Admit date: 01/04/2015  Primary Physician   [name],[name], [name] Primary Cardiologist   Dr. Shirlee Latch Reason for Consultation   CHF  HPI: Jesus [name] is a 69 y.o. male with a history of morbid obesity, chronic atrial fibrillation, resistant HTN, chronic LE edema and chronic diastolic CHF who was admitted to [facility] on [facility]04[facility]15 for LE edema.   Jesus [name] was previously followed by Dr. Shirlee Latch but moved to [facility] and transferred care to Dr. Bing Matter. He last saw him about 2 weeks ago and was doing well. Over the past 9 days he noted that he has had significant weight gain, LE edema with weeping and now scrotal edema. He weighs himself daily and said he is now up 20 lbs above his dry weight of 280 lbs. He denies recent salt indiscretion. No recent illnesses, coughs, fevers or chills. He denies SOB, he sleeps in a recliner as he has chronic orthopnea.He denies PND or chest pain. He denies DOE. He does report a dull sense of fullness in his legs and occasional lightheadedness. He has been taking all of his medications as prescribed. He reports being on Lasix  TID.  His last visit with Cardiology was about 1.5 years ago. Per that note, he is not on anticoagulation due to personal choice. He was on torsemide and metolazone at that time.   Past Medical History  Diagnosis Date  . Hypertension     Note: possible increased edema in past with CCBs, so these are avoided in this patient  . Morbid obesity   . H/O: GI bleed     "cause I took too much aspirin"  . Venous stasis ulcer   . Atrial fibrillation     First noted in 8/12, probably persistent  . Edema of lower extremity   . Chronic diastolic CHF (congestive heart failure) 12/2002    a. EF 50-55% by echo 04/2012  . Gout   . Hyperglycemia     Noted 05/2012     Past Surgical History  Procedure Laterality Date  . No past surgeries       Allergies  Allergen Reactions  . Amlodipine Besylate Swelling    "started off low and ended up severe; if, in fact, that is what's causing the swelling" (06/03/12)    I have reviewed the patient's current medications . aspirin EC  81 mg Oral Daily  . atorvastatin  80 mg Oral q1800  . carvedilol  25 mg Oral BID WC  . colchicine  0.6 mg Oral Daily  . docusate sodium  100 mg Oral BID  . furosemide  80 mg Intravenous Q12H  . heparin  5,000 Units Subcutaneous 3 times per day  . lisinopril  20 mg Oral BID  . potassium chloride SA  20 mEq Oral BID  . sodium chloride  3 mL Intravenous Q12H  . spironolactone  25 mg Oral Daily     acetaminophen **OR** acetaminophen, ondansetron **OR** ondansetron (ZOFRAN) IV  Prior to Admission medications   Medication Sig Start Date End Date Taking? Authorizing Provider  atorvastatin (LIPITOR) 80 MG tablet Take 80 mg by mouth daily.   Yes Historical Provider, MD  carvedilol (COREG) 25 MG tablet Take 1 tablet (25 mg total) by mouth 2 (two) times daily with a meal. 07/14/13  Yes Laurey Morale, MD  lisinopril (PRINIVIL,ZESTRIL) 40 MG tablet Take 0.5 tablets (20 mg total) by  mouth 2 (two) times daily. Patient taking differently: Take 60 mg by mouth 2 (two) times daily.  07/14/13  Yes Laurey Morale, MD  metolazone (ZAROXOLYN) 2.5 MG tablet 1 tablet weekly on Fridays 30 minutes before you take torsemide 07/14/13  Yes Laurey Morale, MD  pantoprazole (PROTONIX) 40 MG tablet Take 40 mg by mouth daily.   Yes Historical Provider, MD  potassium chloride SA (K-DUR,KLOR-CON) 20 MEQ tablet 2 tablets (total 40 mEq) on the day you take metolazone (zaroxlyn) 07/14/13  Yes Laurey Morale, MD  torsemide (DEMADEX) 20 MG tablet 5 tablets (total 100mg ) at the same time in the morning 07/18/13  Yes Laurey Morale, MD  colchicine 0.6 MG tablet Take 1 tablet (0.6 mg total) by mouth daily. Patient not taking: Reported on 01/05/2015 06/11/12 06/11/13  Dayna N Dunn, [name]  spironolactone  (ALDACTONE) 25 MG tablet Take 1 tablet (25 mg total) by mouth daily. Patient not taking: Reported on 01/05/2015 07/14/13 07/14/14  Laurey Morale, MD     History   Social History  . Marital Status: Divorced    Spouse Name: N/A    Number of Children: N/A  . Years of Education: N/A   Occupational History  . Retired    Social History Main Topics  . Smoking status: Former Smoker    Types: Cigarettes    Quit date: 12/08/1965  . Smokeless tobacco: Never Used     Comment: "social cigarette smoker in my late teens"  . Alcohol Use: 8.4 oz/week    14 Cans of beer per week  . Drug Use: No  . Sexual Activity: Not Currently   Other Topics Concern  . Not on file   Social History Narrative   Patient lives alone.    Family Status  Relation Status Death Age  . Father Deceased   . Mother Deceased    Family History  Problem Relation Age of Onset  . Arrhythmia Father   . Hypertension Father   . Cancer Mother     cervical     ROS:  Full 14 point review of systems complete and found to be negative unless listed above.  Physical Exam: Blood pressure 126/59, pulse 60, temperature 98.2 F (36.8 C), temperature source Oral, resp. rate 18, height 5\' 10"  (1.778 m), weight 298 lb 11.6 oz (135.5 kg), SpO2 97 %.  General: Well developed, well nourished, male in no acute distress Head: Eyes PERRLA, No xanthomas.   Normocephalic and atraumatic, oropharynx without edema or exudate.   Lungs: decreased lung sounds at bases Heart: irregularly irregular Neck: No carotid bruits. No lymphadenopathy. I did not see JVD due to body habitus. Abdomen: Bowel sounds present, abdomen soft and non-tender without masses or hernias noted. Msk:  No spine or cva tenderness. No weakness, no joint deformities or effusions. Extremities: No clubbing or cyanosis. 2+ LE edema bilaterally. Legs are wrapped Neuro: Alert and oriented X 3. No focal deficits noted. Psych:  Good affect, responds appropriately Skin: No rashes  or lesions noted.  Labs:   Lab Results  Component Value Date   WBC 6.8 01/05/2015   HGB 11.0* 01/05/2015   HCT 34.2* 01/05/2015   MCV 80.5 01/05/2015   PLT 294 01/05/2015   No results for input(s): INR in the last 72 hours.  Recent Labs Lab 01/05/15 0425  NA 133*  K 3.0*  CL 90*  CO2 37*  BUN 8  CREATININE 0.91  CALCIUM 8.6  GLUCOSE 112*  Recent Labs  01/04/15 1930  TROPIPOC 0.01     2D ECHO Study Date: 05/07/2012 LV EF: 50% - 55% Study Conclusions - Left ventricle: The cavity size was normal. Wall thickness   was increased in a pattern of severe LVH. Systolic   function was normal. The estimated ejection fraction was   in the range of 50% to 55%. Wall motion was normal; there   were no regional wall motion abnormalities. - Mitral valve: Calcified annulus. - Left atrium: The atrium was moderately dilated. - Right ventricle: The cavity size was mildly dilated. - Right atrium: The atrium was mildly dilated. Impressions: - Technically difficult; severe LVH noted and biatrial   enlargement; consider infiltrative cardiomyopathy such as   amyloid.   ECG: HR 64. Atrial fibrillation   Radiology:  Dg Chest 2 View  01/04/2015   CLINICAL DATA:  Chronic shortness of breath. Lower extremity swelling. Congestive heart failure.  EXAM: CHEST  2 VIEW  COMPARISON:  None.  FINDINGS: Mild to moderate cardiomegaly noted as well as ectasia of the thoracic aorta. Pulmonary vascular congestion is seen, however there is no evidence of frank pulmonary edema or focal infiltrate. No evidence of pleural effusion. Old bilateral rib fracture deformities incidentally noted.  IMPRESSION: Cardiomegaly and pulmonary vascular congestion.  No acute findings.   Electronically Signed   By: Myles RosenthalJohn  Stahl M.D.   On: 01/04/2015 19:34    ASSESSMENT AND PLAN:    Principal Problem:   Acute on chronic diastolic CHF (congestive heart failure) Active Problems:   Atrial fibrillation   Hypertension    Peripheral vascular disease   Chronic venous insufficiency   Acute exacerbation of CHF (congestive heart failure)  Jesus [name] is a 69 y.o. male with a history of morbid obesity, chronic atrial fibrillation, resistant HTN, chronic LE edema and chronic diastolic CHF who [facility] for LE edema.   Acute on chronic diastolic CHF- with R>L sx -- Echo (5/13) with severe LVH, biatrial enlargement, EF 50-55%. Repeat ECHO pending -- Placed on 80mg  IV Lasix BID. Net positive 520mL. No subsequent weights, will make sure daily weights are ordered with strict I/Os. -- Continue spironolactone 25mg .  -- Will start Lasix gtt at 15mg /hr and metolazone 5mg  BID. Will supplement K with Kdur 40mg  BID. Patient reports that Lasix gtt was the only thing that has worked for him in the past.  Chronic atrial fibrillation. Rate is controlled. Previously declined long term anticoagulation. I talked to him about starting coumadin or NOAC (renal function okay). He is willing to try something for thromboembolic prophylaxis  -- Will start Eliqis 5mg  BID and have care management see to ensure he has coverage for this.   Chronic LE edema- long history of this, will be hard to distinguish what is chronic from CHF exacerbation.   Obesity- diet and exercise. 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 Shirlee LatchMcLean and travel in from [place] for his appointments.  SignedAllena Katz: THOMPSON, KATHRYN R, [name] 01/05/2015 11:33 AM  Pager [phone]  Co-Sign MD  Patient seen and examined with Carlean JewsKatie Thompson, [name]. We discussed all aspects of the encounter. I agree with the assessment and plan as stated above. He has marked volume overload with R >>L HF not responding well to bolus IV lasix. He says lasix gtt worked in  the past. Will switch to lasix gtt and  metolazone. Repeat echo. Will need anticoagulation for AF. Favor Eliquis if he can afford. Will check with case manager. Will also need outpatient sleep study. Switch back to torsemide at d/c.   Deionna Marcantonio,MD 1:55 PM

## 2015-01-05 NOTE — Progress Notes (Signed)
2Orthopedic Tech Progress Note Patient Details:  Jesus [name] 06-29-1946 [id number]  Ortho Devices Type of Ortho Device: Roland RackUnna boot Ortho Device/Splint Location: bilateral Ortho Device/Splint Interventions: Application   [name] 01/05/2015, 10:16 AM

## 2015-01-05 NOTE — Consult Note (Addendum)
WOC wound consult note Reason for Consult: Consult requested for bilat legs. He has chronic venous stasis changes to feet and calf areas.  Wound type: No open wounds Drainage (amount, consistency, odor) Large amt yellow drainage weeping from skin, no odor Periwound: Dry crusted peeling skin to BLE Dressing procedure/placement/frequency: Pt was wearing Una boots to reduce edema prior to admission and these were being changed 3Xweek. He appears to be well-informed regarding topical treatment. Continue present paln of care.  [facility].  Paged ortho tech to apply to BLE and change Q M/W/F while in hospital. Please re-consult if further assistance is needed.  Thank-you,  Cammie Mcgeeawn Madix Blowe MSN, RN, CWOCN, [place]AP, CNS [phone]

## 2015-01-05 NOTE — Care Management Note (Signed)
    Page 1 of 2   01/12/2015     11:00:25 AM CARE MANAGEMENT NOTE 01/12/2015  Patient:  Jesus [name],Jesus [name]   Account Number:  [id number]  Date Initiated:  01/05/2015  Documentation initiated by:  Norina Cowper  Subjective/Objective Assessment:   Pt adm on 01/04/15 with LE swelling, CHF.  PTA, pt resides at home alone.  [facility].     Action/Plan:   Will need res[facility] care [facility] to [facility].  Will cont to follow for dc needs as pt progresses.  Pt agrees to community case mgmt follow up [facility].   Anticipated DC Date:  01/12/2015   Anticipated DC Plan:  HOME W HOME HEALTH SERVICES      DC Planning Services  CM consult  Other      [facility] Choice  Resumption Of Svcs/PTA Provider   Choice offered to / List presented to:  C-1 Patient        HH arranged  HH-1 RN  HH-2 PT  HH-10 DISEASE MANAGEMENT      HH [facility]   Status of service:  Completed, signed off Medicare Important Message given?  YES (If response is "NO", the following Medicare IM given date fields will be blank) Date Medicare IM given:  01/08/2015 Medicare IM given by:  Margy Sumler Date Additional Medicare IM given:  01/11/2015 Additional Medicare IM given by:  Sidney AceJULIE Ming Mcmannis  [facility]charge Disposition:  [facility]  Per UR Regulation:  Reviewed for med. necessity/level of care/duration of stay  If discussed at Long Length of Stay Meetings, dates discussed:   01/11/2015    Comments:  01/12/15 Sidney AceJulie Harlee Pursifull, RN, BSN [phone] Pt for dc home today; MD to write [facility] orders [facility].  Start of care 24-48h post dc date.  Will fax [facility] at [phone].  per Francine [employer] rep:  $47.00/ no auth required  patient can use: [facility], [facility], and [facility]   01/11/15 Sidney AceJulie Vear Staton, RN, BSN [phone] Pt on Eliquis therapy.  Will check copay for med, and give free 30 trial card.  01/05/15 Sidney AceJulie Akili Cuda, RN, BSN  [phone] CM consult to see if NOACs are covered:  Pt has [facility] HMO through [facility], and  I am sure he will have some sort of coverage for NOACs.  Unfortunately, at the time of receipt of this consult, I am unable to check any benefit information, as it is after hours.  Best advice would be to start on desired NOAC, and if pt still here on Monday, will be happy to check copay information.

## 2015-01-05 NOTE — Consult Note (Signed)
Referral received from inpatient RNCM in progression meeting. Met with the patient at bedside to offer [place] Management services as benefit of [employer]. Patient agreeable to services and will receive post hospital discharge call and will be evaluated for monthly home visits. Consent form signed and folder with [place] Management information given. Patient is currently active with [facility] for dressing changes to bilateral legs. Of note, [facility] Care Management services does not replace or interfere with any services that are arranged by inpatient case management or social work. For additional questions or referrals please contact, Natividad Brood, RN BSN CCM, [facility] Liaison at [phone].

## 2015-01-05 NOTE — Progress Notes (Signed)
Pt notified RN that he was treated for fluid overload "2 years ago" and was "hooked up to an IV all of the time". Pt states this is the only thing that worked for him. Pt states that "you are doing nothing to help me" and that he could do the same thing for himself at home. Pt educated on current medication orders, pt receiving 80mg  IV lasix BID. Pt states he doesn't want to stay in hospital multiple days if he is wasting his time.   Pt also educated on importance of keeping legs elevated and scrotum supported in bed. Pt remains sitting on side of bed without scrotal support.   Will continue to educate and monitor patient tonight.

## 2015-01-05 NOTE — Progress Notes (Signed)
Pt's HR sustaining 40's and low 50's, pt sleeping comfortably with no complaints.  Will continue to monitor.

## 2015-01-06 DIAGNOSIS — I10 Essential (primary) hypertension: Secondary | ICD-10-CM

## 2015-01-06 DIAGNOSIS — I509 Heart failure, unspecified: Secondary | ICD-10-CM

## 2015-01-06 DIAGNOSIS — I482 Chronic atrial fibrillation: Secondary | ICD-10-CM

## 2015-01-06 LAB — BASIC METABOLIC PANEL
Anion gap: 8 (ref 5–15)
BUN: 15 mg/dL (ref 6–23)
CO2: 35 mmol/L — ABNORMAL HIGH (ref 19–32)
Calcium: 9.2 mg/dL (ref 8.4–10.5)
Chloride: 90 mmol/L — ABNORMAL LOW (ref 96–112)
Creatinine, Ser: 1.43 mg/dL — ABNORMAL HIGH (ref 0.50–1.35)
GFR calc non Af Amer: 49 mL/min — ABNORMAL LOW (ref >90)
GFR, EST AFRICAN AMERICAN: 57 mL/min — AB (ref >90)
GLUCOSE: 105 mg/dL — AB (ref 70–99)
Potassium: 3.3 mmol/L — ABNORMAL LOW (ref 3.5–5.1)
Sodium: 133 mmol/L — ABNORMAL LOW (ref 135–145)

## 2015-01-06 NOTE — Progress Notes (Signed)
  Echocardiogram 2D Echocardiogram has been performed.  Janalyn HarderWest, Nadiya Pieratt R 01/06/2015, 2:48 PM

## 2015-01-06 NOTE — Progress Notes (Addendum)
Patient ID: Jesus [name]  male  [id number]    DOB: 1946-03-23    DOA: 01/04/2015  PCP: Jesus KinsAVIS,[name], [name]  Primary cardiologist: Dr. Shirlee LatchMclean  Brief history of present illness  Patient is a 69 year old male with atrial fibrillation not on Coumadin, diastolic CHF, hypertension, PVD, CAD presented with peripheral edema and gaining of weight, 20 pounds. Patient noted that in the last month he has had progressive swelling that started in his legs and progressively upwards, now has scrotal swelling and weeping from his legs. Patient has been weighing himself daily and noted a 20 pound weight gain. He sleeps in a recliner, denies chest pain or dyspnea on exertion. Patient reports that he has been taking his medications. However he has not followed up with cardiology since last year as [place] moved [place].  Assessment/Plan: Principal Problem:   Acute on chronic diastolic CHF (congestive heart failure) - Placed on Lasix drip, heart failure team following still positive balance of 1.5 L, weight has not changed at all from admission  - 2-D echo pending, legs wrapped by wound care - Continue aspirin, beta blocker, statin -  Lives alone, [facility], needs compliance with cardiology appointments.  Active Problems:   Atrial fibrillation - Currently heart rate controlled, continue beta blocker. - CHADS Vasc 3, started on eliquis    Hypertension With hypertensive cardiomyopathy, severe LVH - Continue beta blocker, lisinopril, spironolactone, Lasix drip    Morbid obesity - Patient counseled on diet and weight control    Peripheral vascular disease - Continue aspirin    Chronic venous insufficiency -wound Care consulted   DVT Prophylaxis:Heparin subcutaneous   Code Status:Full code   Family Communication:  Disposition:  Consultants:  Cardiology   Procedures:  None   Antibiotics:  none    Subjective: Patient seen and examined, feeling somewhat  better, sitting in the recliner, no acute issues overnight  Objective: Weight change: 0.035 kg (1.2 oz)  Intake/Output Summary (Last 24 hours) at 01/06/15 1007 Last data filed at 01/06/15 0905  Gross per 24 hour  Intake 1329.75 ml  Output    350 ml  Net 979.75 ml   Blood pressure 114/68, pulse 58, temperature 98.4 F (36.9 C), temperature source Oral, resp. rate 18, height 5\' 10"  (1.778 m), weight 135.535 kg (298 lb 12.8 oz), SpO2 99 %.  Physical Exam: General: Alert and awake, oriented x3, not in any acute distress. CVS: S1-S2 clear, no murmur rubs or gallops Chest: Decreased breath sounds at the bases Abdomen: soft nontender, nondistended, normal bowel sounds  Extremities: no cyanosis, clubbing. Legs wrapped Neuro: Cranial nerves II-XII intact, no focal neurological deficits  Lab Results: Basic Metabolic Panel:  Recent Labs Lab 01/05/15 0425 01/06/15 0541  NA 133* 133*  K 3.0* 3.3*  CL 90* 90*  CO2 37* 35*  GLUCOSE 112* 105*  BUN 8 15  CREATININE 0.91 1.43*  CALCIUM 8.6 9.2   Liver Function Tests: No results for input(s): AST, ALT, ALKPHOS, BILITOT, PROT, ALBUMIN in the last 168 hours. No results for input(s): LIPASE, AMYLASE in the last 168 hours. No results for input(s): AMMONIA in the last 168 hours. CBC:  Recent Labs Lab 01/04/15 1853 01/05/15 0425  WBC 7.1 6.8  NEUTROABS 5.2 5.0  HGB 11.4* 11.0*  HCT 35.4* 34.2*  MCV 80.8 80.5  PLT 294 294   Cardiac Enzymes: No results for input(s): CKTOTAL, CKMB, CKMBINDEX, TROPONINI in the last 168 hours. BNP: Invalid input(s): POCBNP CBG: No results  for input(s): GLUCAP in the last 168 hours.   Micro Results: No results found for this or any previous visit (from the past 240 hour(s)).  Studies/Results: Dg Chest 2 View  01/04/2015   CLINICAL DATA:  Chronic shortness of breath. Lower extremity swelling. Congestive heart failure.  EXAM: CHEST  2 VIEW  COMPARISON:  None.  FINDINGS: Mild to moderate  cardiomegaly noted as well as ectasia of the thoracic aorta. Pulmonary vascular congestion is seen, however there is no evidence of frank pulmonary edema or focal infiltrate. No evidence of pleural effusion. Old bilateral rib fracture deformities incidentally noted.  IMPRESSION: Cardiomegaly and pulmonary vascular congestion.  No acute findings.   Electronically Signed   By: Myles Rosenthal M.D.   On: 01/04/2015 19:34    Medications: Scheduled Meds: . apixaban  5 mg Oral BID  . aspirin EC  81 mg Oral Daily  . atorvastatin  80 mg Oral q1800  . carvedilol  25 mg Oral BID WC  . colchicine  0.6 mg Oral Daily  . docusate sodium  100 mg Oral BID  . lisinopril  20 mg Oral BID  . metolazone  5 mg Oral BID  . potassium chloride  40 mEq Oral BID  . sodium chloride  3 mL Intravenous Q12H  . spironolactone  25 mg Oral Daily   Time spent 25 minutes   LOS: 2 days   [name] M.D. [facility] 01/06/2015, 10:07 AM Pager: [phone]  If 7PM-7AM, please contact night-coverage [email or web address] Password [email or web address]

## 2015-01-06 NOTE — Progress Notes (Signed)
Pt is on lasix drip and is incontinent at times, refused condom cath.

## 2015-01-06 NOTE — Progress Notes (Signed)
Patient Name: Jesus [name] Date of Encounter: 01/06/2015  Principal Problem:   Acute on chronic diastolic CHF (congestive heart failure) Active Problems:   Atrial fibrillation   Hypertension   Morbid obesity   Peripheral vascular disease   Chronic venous insufficiency   Length of Stay: 2  SUBJECTIVE  69 y.o. male with a history of morbid obesity, chronic atrial fibrillation, resistant HTN, chronic LE edema and chronic diastolic CHF who was admitted to [facility] on 01/04/15 for LE edema/scrotal edema, roughly 20 lb weight gain. Despite furosemide drip and metolazone, no change in weight yet. Incontinent, unable to measure UO accurately. Refuses urinary catheter.  CURRENT MEDS . apixaban  5 mg Oral BID  . aspirin EC  81 mg Oral Daily  . atorvastatin  80 mg Oral q1800  . carvedilol  25 mg Oral BID WC  . colchicine  0.6 mg Oral Daily  . docusate sodium  100 mg Oral BID  . lisinopril  20 mg Oral BID  . metolazone  5 mg Oral BID  . potassium chloride  40 mEq Oral BID  . sodium chloride  3 mL Intravenous Q12H  . spironolactone  25 mg Oral Daily    OBJECTIVE   Intake/Output Summary (Last 24 hours) at 01/06/15 1006 Last data filed at 01/06/15 0905  Gross per 24 hour  Intake 1329.75 ml  Output    350 ml  Net 979.75 ml   Filed Weights   01/04/15 2243 01/06/15 0400  Weight: 298 lb 11.6 oz (135.5 kg) 298 lb 12.8 oz (135.535 kg)    PHYSICAL EXAM Filed Vitals:   01/05/15 1419 01/05/15 2052 01/05/15 2347 01/06/15 0400  BP: 113/54 137/70 116/63 114/68  Pulse:  64 70 58  Temp:  98.6 F (37 C) 98 F (36.7 C) 98.4 F (36.9 C)  TempSrc:  Oral Oral Oral  Resp:  18 20 18   Height:      Weight:    298 lb 12.8 oz (135.535 kg)  SpO2:  97% 96% 99%   General: Alert, oriented x3, no distress Head: no evidence of trauma, PERRL, EOMI, no exophtalmos or lid lag, no myxedema, no xanthelasma; normal ears, nose and oropharynx Neck: normal jugular venous pulsations and no hepatojugular  reflux; brisk carotid pulses without delay and no carotid bruits Chest: clear to auscultation, no signs of consolidation by percussion or palpation, normal fremitus, symmetrical and full respiratory excursions Cardiovascular: normal position and quality of the apical impulse, regular rhythm, normal first and second heart sounds, no rubs or gallops, no murmur Abdomen: no tenderness or distention, no masses by palpation, no abnormal pulsatility or arterial bruits, normal bowel sounds, no hepatosplenomegaly Extremities: no clubbing, cyanosis, massive bilateral edema and scrotal edema; 2+ radial, ulnar and brachial pulses bilaterally; 2+ right femoral, 2+ left femoral, unable to palpate posterior tibial and dorsalis pedis pulses; no subclavian or femoral bruits Neurological: grossly nonfocal  LABS  CBC  Recent Labs  01/04/15 1853 01/05/15 0425  WBC 7.1 6.8  NEUTROABS 5.2 5.0  HGB 11.4* 11.0*  HCT 35.4* 34.2*  MCV 80.8 80.5  PLT 294 294   Basic Metabolic Panel  Recent Labs  01/05/15 0425 01/06/15 0541  NA 133* 133*  K 3.0* 3.3*  CL 90* 90*  CO2 37* 35*  GLUCOSE 112* 105*  BUN 8 15  CREATININE 0.91 1.43*  CALCIUM 8.6 9.2    Radiology Studies Imaging results have been reviewed and Dg Chest 2 View  01/04/2015   CLINICAL DATA:  Chronic shortness of breath. Lower extremity swelling. Congestive heart failure.  EXAM: CHEST  2 VIEW  COMPARISON:  None.  FINDINGS: Mild to moderate cardiomegaly noted as well as ectasia of the thoracic aorta. Pulmonary vascular congestion is seen, however there is no evidence of frank pulmonary edema or focal infiltrate. No evidence of pleural effusion. Old bilateral rib fracture deformities incidentally noted.  IMPRESSION: Cardiomegaly and pulmonary vascular congestion.  No acute findings.   Electronically Signed   By: Myles Rosenthal M.D.   On: 01/04/2015 19:34    TELE AF, controlled rate  ECG AF, old sharp inferior Q waves  ASSESSMENT AND  PLAN  Massive volume overload, unable to quickly evaluate response to diuretic due to inability to quantify UO.  Continue IV lasix, will monitor via daily weights and degree of edema. AF well rate controlled. On Eliquis. Monitor and replace K.  Thurmon Fair, MD, [facility] [phone] office [phone] pager 01/06/2015 10:06 AM

## 2015-01-07 DIAGNOSIS — R609 Edema, unspecified: Secondary | ICD-10-CM | POA: Insufficient documentation

## 2015-01-07 LAB — BASIC METABOLIC PANEL
Anion gap: 9 (ref 5–15)
BUN: 22 mg/dL (ref 6–23)
CALCIUM: 9.2 mg/dL (ref 8.4–10.5)
CO2: 35 mmol/L — AB (ref 19–32)
CREATININE: 1.55 mg/dL — AB (ref 0.50–1.35)
Chloride: 88 mmol/L — ABNORMAL LOW (ref 96–112)
GFR calc Af Amer: 51 mL/min — ABNORMAL LOW (ref >90)
GFR, EST NON AFRICAN AMERICAN: 44 mL/min — AB (ref >90)
Glucose, Bld: 123 mg/dL — ABNORMAL HIGH (ref 70–99)
POTASSIUM: 3.6 mmol/L (ref 3.5–5.1)
Sodium: 132 mmol/L — ABNORMAL LOW (ref 135–145)

## 2015-01-07 NOTE — Progress Notes (Addendum)
Patient Name: Jesus [name] Date of Encounter: 01/07/2015  Principal Problem:   Acute on chronic diastolic CHF (congestive heart failure) Active Problems:   Atrial fibrillation   Hypertension   Morbid obesity   Peripheral vascular disease   Chronic venous insufficiency   Length of Stay: 3  SUBJECTIVE  Still has massive lower extremity and scrotal edema Unable to measure urine output Echocardiogram was a frustratingly limited study due to his unwillingness to lie down. Images were sufficient to state that left ventricular systolic function remains normal, but unable to assess left ventricular filling pressure or pulmonary artery pressure. No obvious left-sided valve abnormalities, but tricuspid valve could not be well assessed.  CURRENT MEDS . apixaban  5 mg Oral BID  . aspirin EC  81 mg Oral Daily  . atorvastatin  80 mg Oral q1800  . carvedilol  25 mg Oral BID WC  . colchicine  0.6 mg Oral Daily  . docusate sodium  100 mg Oral BID  . lisinopril  20 mg Oral BID  . metolazone  5 mg Oral BID  . potassium chloride  40 mEq Oral BID  . sodium chloride  3 mL Intravenous Q12H  . spironolactone  25 mg Oral Daily    OBJECTIVE   Intake/Output Summary (Last 24 hours) at 01/07/15 0911 Last data filed at 01/07/15 [id number]  Gross per 24 hour  Intake   1815 ml  Output      0 ml  Net   1815 ml   Filed Weights   01/04/15 2243 01/06/15 0400 01/07/15 0500  Weight: 298 lb 11.6 oz (135.5 kg) 298 lb 12.8 oz (135.535 kg) 297 lb 12.8 oz (135.081 kg)    PHYSICAL EXAM Filed Vitals:   01/06/15 1448 01/06/15 2053 01/07/15 0014 01/07/15 0500  BP: 116/55 104/60 102/57 107/93  Pulse: 97 64 60 70  Temp: 97.4 F (36.3 C) 97.3 F (36.3 C) 98 F (36.7 C) 98 F (36.7 C)  TempSrc: Oral Oral Oral Oral  Resp: 20 20 20 18   Height:      Weight:    297 lb 12.8 oz (135.081 kg)  SpO2: 99% 96% 98% 96%   General: Alert, oriented x3, no distress, morbid obesity Head: no evidence of trauma, PERRL,  EOMI, no exophtalmos or lid lag, no myxedema, no xanthelasma; normal ears, nose and oropharynx Neck: normal jugular venous pulsations and no hepatojugular reflux; brisk carotid pulses without delay and no carotid bruits Chest: clear to auscultation, no signs of consolidation by percussion or palpation, normal fremitus, symmetrical and full respiratory excursions Cardiovascular: normal position and quality of the apical impulse, irregular rhythm, normal first and second heart sounds, no rubs or gallops, no murmur Abdomen: no tenderness or distention, no masses by palpation, no abnormal pulsatility or arterial bruits, normal bowel sounds, no hepatosplenomegaly Extremities: no clubbing, cyanosis, massive bilateral edema and scrotal edema; 2+ radial, ulnar and brachial pulses bilaterally; 2+ right femoral, 2+ left femoral, unable to palpate posterior tibial and dorsalis pedis pulses; no subclavian or femoral bruits Neurological: grossly nonfocal  LABS  CBC  Recent Labs  01/04/15 1853 01/05/15 0425  WBC 7.1 6.8  NEUTROABS 5.2 5.0  HGB 11.4* 11.0*  HCT 35.4* 34.2*  MCV 80.8 80.5  PLT 294 294   Basic Metabolic Panel  Recent Labs  01/06/15 0541 01/07/15 0323  NA 133* 132*  K 3.3* 3.6  CL 90* 88*  CO2 35* 35*  GLUCOSE 105* 123*  BUN 15 22  CREATININE 1.43* 1.55*  CALCIUM 9.2 9.2   Radiology Studies Imaging results have been reviewed and No results found.  TELE No significant new arrhythmia   ASSESSMENT AND PLAN Frustrating limitations in clinical and laboratory data due to his body habitus, incontinence and poor quality echo images. He seems to be slowly losing weight, which is the only real way we have of monitoring his diuresis. Renal function has deteriorated slightly. He seems to have advanced right heart failure, but unfortunately cannot directly assess the right ventricle by echocardiography or measure his PA pressure. May need right heart catheterization if renal function  deteriorates further.   Thurmon Fair, MD, [facility] [phone] office [phone] pager 01/07/2015 9:11 AM

## 2015-01-07 NOTE — Progress Notes (Signed)
Patient ID: Jesus [name]  male  ZOX:[id number]    DOB: [birth year]    DOA: 01/04/2015  PCP: Miki Kins  Primary cardiologist: Dr. Shirlee Latch  Brief history of present illness  Patient is a 69 year old male with atrial fibrillation not on Coumadin, diastolic CHF, hypertension, PVD, CAD presented with peripheral edema and gaining of weight, 20 pounds. Patient noted that in the last month he has had progressive swelling that started in his legs and progressively upwards, now has scrotal swelling and weeping from his legs. Patient has been weighing himself daily and noted a 20 pound weight gain. He sleeps in a recliner, denies chest pain or dyspnea on exertion. Patient reports that he has been taking his medications. However he has not followed up with cardiology since last year as he moved to [place], [place].  Assessment/Plan: Principal Problem:   Acute on chronic diastolic CHF (congestive heart failure) - Placed on Lasix drip, heart failure team following, 3.3 L in positive balance, weight only down 1lbs?  - legs wrapped by wound care. 2-D echo showed EF of 55-60% however poor study - Continue aspirin, beta blocker, statin -  Lives alone, will benefit from [facility] network, needs compliance with cardiology appointments.  Active Problems:   Atrial fibrillation - Currently heart rate controlled, continue beta blocker. - CHADS Vasc 3, started on eliquis    Hypertension With hypertensive cardiomyopathy, severe LVH - Continue beta blocker, lisinopril, spironolactone, Lasix drip    Morbid obesity - Patient counseled on diet and weight control    Peripheral vascular disease - Continue aspirin    Chronic venous insufficiency -wound Care consulted   DVT Prophylaxis:Heparin subcutaneous   Code Status:Full code   Family Communication:  Disposition:  Consultants:  Cardiology   Procedures:  None   Antibiotics:  none    Subjective: Still significant edema, sitting in the  recliner, no shortness of breath, no chest pain  Objective: Weight change: -0.454 kg (-1 lb)  Intake/Output Summary (Last 24 hours) at 01/07/15 1039 Last data filed at 01/07/15 0814  Gross per 24 hour  Intake   1815 ml  Output      0 ml  Net   1815 ml   Blood pressure 107/93, pulse 70, temperature 98 F (36.7 C), temperature source Oral, resp. rate 18, height  (1.778 m), weight 135.081 kg (297 lb 12.8 oz), SpO2 96 %.  Physical Exam: General: Alert and awake, oriented x3, not in any acute distress. CVS: S1-S2 clear, no murmur rubs or gallops Chest: Decreased breath sounds at the bases Abdomen: soft nontender, nondistended, normal bowel sounds  Extremities: no cyanosis, clubbing. Legs wrapped, scrotal edema Neuro: Cranial nerves II-XII intact, no focal neurological deficits  Lab Results: Basic Metabolic Panel:  Recent Labs Lab 01/06/15 0541 01/07/15 0323  NA 133* 132*  K 3.3* 3.6  CL 90* 88*  CO2 35* 35*  GLUCOSE 105* 123*  BUN 15 22  CREATININE 1.43* 1.55*  CALCIUM 9.2 9.2   Liver Function Tests: No results for input(s): AST, ALT, ALKPHOS, BILITOT, PROT, ALBUMIN in the last 168 hours. No results for input(s): LIPASE, AMYLASE in the last 168 hours. No results for input(s): AMMONIA in the last 168 hours. CBC:  Recent Labs Lab 01/04/15 1853 01/05/15 0425  WBC 7.1 6.8  NEUTROABS 5.2 5.0  HGB 11.4* 11.0*  HCT 35.4* 34.2*  MCV 80.8 80.5  PLT 294 294   Cardiac Enzymes: No results for input(s): CKTOTAL, CKMB, CKMBINDEX, TROPONINI in the last 168  hours. BNP: Invalid input(s): POCBNP CBG: No results for input(s): GLUCAP in the last 168 hours.   Micro Results: No results found for this or any previous visit (from the past 240 hour(s)).  Studies/Results: Dg Chest 2 View  01/04/2015   CLINICAL DATA:  Chronic shortness of breath. Lower extremity swelling. Congestive heart failure.  EXAM: CHEST  2 VIEW  COMPARISON:  None.  FINDINGS: Mild to moderate  cardiomegaly noted as well as ectasia of the thoracic aorta. Pulmonary vascular congestion is seen, however there is no evidence of frank pulmonary edema or focal infiltrate. No evidence of pleural effusion. Old bilateral rib fracture deformities incidentally noted.  IMPRESSION: Cardiomegaly and pulmonary vascular congestion.  No acute findings.   Electronically Signed   By: Myles RosenthalJohn  Stahl M.D.   On: 01/04/2015 19:34    Medications: Scheduled Meds: . apixaban  5 mg Oral BID  . aspirin EC  81 mg Oral Daily  . atorvastatin  80 mg Oral q1800  . carvedilol  25 mg Oral BID WC  . colchicine  0.6 mg Oral Daily  . docusate sodium  100 mg Oral BID  . lisinopril  20 mg Oral BID  . metolazone  5 mg Oral BID  . potassium chloride  40 mEq Oral BID  . sodium chloride  3 mL Intravenous Q12H  . spironolactone  25 mg Oral Daily   Time spent 25 minutes   LOS: 3 days   Loany Neuroth M.D. [facility] 01/07/2015, 10:39 AM Pager: [phone]  If 7PM-7AM, please contact night-coverage [email or web address] Password [email or web address]

## 2015-01-08 DIAGNOSIS — N179 Acute kidney failure, unspecified: Secondary | ICD-10-CM

## 2015-01-08 LAB — BASIC METABOLIC PANEL
ANION GAP: 10 (ref 5–15)
BUN: 26 mg/dL — AB (ref 6–23)
CO2: 37 mmol/L — ABNORMAL HIGH (ref 19–32)
CREATININE: 1.82 mg/dL — AB (ref 0.50–1.35)
Calcium: 9 mg/dL (ref 8.4–10.5)
Chloride: 85 mmol/L — ABNORMAL LOW (ref 96–112)
GFR calc Af Amer: 42 mL/min — ABNORMAL LOW (ref >90)
GFR, EST NON AFRICAN AMERICAN: 36 mL/min — AB (ref >90)
Glucose, Bld: 186 mg/dL — ABNORMAL HIGH (ref 70–99)
Potassium: 3.6 mmol/L (ref 3.5–5.1)
SODIUM: 132 mmol/L — AB (ref 135–145)

## 2015-01-08 MED ORDER — POTASSIUM CHLORIDE CRYS ER 20 MEQ PO TBCR
40.0000 meq | EXTENDED_RELEASE_TABLET | Freq: Once | ORAL | Status: AC
  Administered 2015-01-08: 40 meq via ORAL

## 2015-01-08 NOTE — Progress Notes (Signed)
Patient ID: Jesus [name], male   DOB: January 16, 1946, 69 y.o.   MRN: [id number]   SUBJECTIVE: Still very edematous.  Unable to measure UOP (urinating into towel).  Creatinine up slightly to 1.5.   Scheduled Meds: . apixaban  5 mg Oral BID  . atorvastatin  80 mg Oral q1800  . carvedilol  25 mg Oral BID WC  . colchicine  0.6 mg Oral Daily  . docusate sodium  100 mg Oral BID  . metolazone  5 mg Oral BID  . potassium chloride  40 mEq Oral BID  . potassium chloride  40 mEq Oral Once  . sodium chloride  3 mL Intravenous Q12H  . spironolactone  25 mg Oral Daily   Continuous Infusions: . furosemide (LASIX) infusion 15 mg/hr (01/06/15 2114)   PRN Meds:.acetaminophen **OR** acetaminophen, ondansetron **OR** ondansetron (ZOFRAN) IV    Filed Vitals:   01/07/15 1730 01/07/15 2027 01/08/15 0227 01/08/15 0533  BP: 92/50 91/52 127/76 114/53  Pulse:  67 77 63  Temp:  97.7 F (36.5 C)    TempSrc:  Oral    Resp:  18  20  Height:      Weight:    297 lb 8 oz (134.945 kg)  SpO2:  93% 99% 95%    Intake/Output Summary (Last 24 hours) at 01/08/15 0755 Last data filed at 01/08/15 0530  Gross per 24 hour  Intake   2325 ml  Output    925 ml  Net   1400 ml    LABS: Basic Metabolic Panel:  Recent Labs  40/98/1101/30/16 0541 01/07/15 0323  NA 133* 132*  K 3.3* 3.6  CL 90* 88*  CO2 35* 35*  GLUCOSE 105* 123*  BUN 15 22  CREATININE 1.43* 1.55*  CALCIUM 9.2 9.2   Liver Function Tests: No results for input(s): AST, ALT, ALKPHOS, BILITOT, PROT, ALBUMIN in the last 72 hours. No results for input(s): LIPASE, AMYLASE in the last 72 hours. CBC: No results for input(s): WBC, NEUTROABS, HGB, HCT, MCV, PLT in the last 72 hours. Cardiac Enzymes: No results for input(s): CKTOTAL, CKMB, CKMBINDEX, TROPONINI in the last 72 hours. BNP: Invalid input(s): POCBNP D-Dimer: No results for input(s): DDIMER in the last 72 hours. Hemoglobin A1C: No results for input(s): HGBA1C in the last 72 hours. Fasting  Lipid Panel: No results for input(s): CHOL, HDL, LDLCALC, TRIG, CHOLHDL, LDLDIRECT in the last 72 hours. Thyroid Function Tests: No results for input(s): TSH, T4TOTAL, T3FREE, THYROIDAB in the last 72 hours.  Invalid input(s): FREET3 Anemia Panel: No results for input(s): VITAMINB12, FOLATE, FERRITIN, TIBC, IRON, RETICCTPCT in the last 72 hours.  RADIOLOGY: Dg Chest 2 View  01/04/2015   CLINICAL DATA:  Chronic shortness of breath. Lower extremity swelling. Congestive heart failure.  EXAM: CHEST  2 VIEW  COMPARISON:  None.  FINDINGS: Mild to moderate cardiomegaly noted as well as ectasia of the thoracic aorta. Pulmonary vascular congestion is seen, however there is no evidence of frank pulmonary edema or focal infiltrate. No evidence of pleural effusion. Old bilateral rib fracture deformities incidentally noted.  IMPRESSION: Cardiomegaly and pulmonary vascular congestion.  No acute findings.   Electronically Signed   By: Jesus [name]  Stahl M.D.   On: 01/04/2015 19:34    PHYSICAL EXAM General: NAD Neck: JVP difficult, ?8-9 cm, no thyromegaly or thyroid nodule.  Lungs: Clear to auscultation bilaterally with normal respiratory effort. CV: Nondisplaced PMI.  Heart irregular S1/S2, no S3/S4, no murmur. 2+ edema to thighs, scrotal edema.  Abdomen: Obese, soft, nontender, no hepatosplenomegaly, no distention.  Neurologic: Alert and oriented x 3.  Psych: Normal affect. Extremities: No clubbing or cyanosis.   TELEMETRY: Reviewed telemetry pt in atrial fibrillation HR 60s  ASSESSMENT AND PLAN: 69 yo with history of chronic atrial fibrillation, diastolic CHF, HTN presented with acute on chronic diastolic CHF.  1. Atrial fibrillation: Chronic x years.  Good rate control with Coreg.  He agrees to start Eliquis (had refused anticoagulation in the past).  2. Acute on chronic diastolic CHF: Prominent leg and scrotal edema but JVP difficult to assess.  Creatinine is up some.  Very difficult to assess progress  as he is incontinent of urine and refuses a foley.  Probably still has volume overload (not just venous insufficiency).  Diurese today, make NPO at midnight for probable RHC tomorrow. Continue Lasix gtt and metolazone.  3. AKI: Creatinine up to 1.5.  BP soft, stop lisinopril.  Watch closely with further diuresis, plan RHC tomorrow.   Jesus [name] 01/08/2015 7:59 AM

## 2015-01-08 NOTE — Progress Notes (Signed)
Heart Failure Navigator Consult Note  Presentation: Jesus [name] is a 69 year old male with atrial fibrillation not on Coumadin, diastolic CHF, hypertension, PVD, CAD presented with peripheral edema and gaining of weight, 20 pounds. Patient noted that in the last month he has had progressive swelling that started in his legs and progressively upwards, now has scrotal swelling and weeping from his legs. Patient has been weighing himself daily and noted a 20 pound weight gain. He sleeps in a recliner, denies chest pain or dyspnea on exertion. Patient reports that he has been taking his medications. However he has not followed up with cardiology since last year as he moved to [place], [place]   Past Medical History  Diagnosis Date  . Hypertension   . Morbid obesity   . H/O: GI bleed     "cause I took too much aspirin"  . Venous stasis ulcer   . Atrial fibrillation     a. chronic   . Edema of lower extremity   . Chronic diastolic CHF (congestive heart failure) 12/2002    a. EF 50-55% by echo 04/2012  . Gout   . Hyperglycemia     Noted 05/2012    History   Social History  . Marital Status: Divorced    Spouse Name: N/A    Number of Children: N/A  . Years of Education: N/A   Occupational History  . Retired    Social History Main Topics  . Smoking status: Former Smoker    Types: Cigarettes    Quit date: 12/08/1965  . Smokeless tobacco: Never Used     Comment: "social cigarette smoker in my late teens"  . Alcohol Use: 8.4 oz/week    14 Cans of beer per week  . Drug Use: No  . Sexual Activity: Not Currently   Other Topics Concern  . None   Social History Narrative   Patient lives alone.    ECHO:Study Conclusions--01/06/15  - Procedure narrative: Transthoracic echocardiography. Image quality was adequate. The study was technically difficult, as a result of poor acoustic windows, poor sound wave transmission, poor patient compliance, restricted patient mobility, and  body habitus. - Left ventricle: There was moderate concentric hypertrophy. Systolic function was normal. The estimated ejection fraction was in the range of 55% to 60%. Although no diagnostic regional wall motion abnormality was identified, this possibility cannot be completely excluded on the basis of this study. - Left atrium: The atrium was dilated. - Right ventricle: The cavity size was dilated. - Right atrium: The atrium was dilated. - Atrial septum: No defect or patent foramen ovale was identified.  Impressions:  - Very limited study. No apical views or Doppler analysis.  Transthoracic echocardiography. M-mode, complete 2D, spectral Doppler, and color Doppler. Birthdate: Patient birthdate: 1946-03-18. Age: Patient is 69 yr old. Sex: Gender: male. BMI: 42.8 kg/m^2. Blood pressure:   114/68 Patient status: Inpatient. Study date: Study date: 01/06/2015. Study time: 02:01 PM. Location: Bedside.  BNP    Component Value Date/Time   PROBNP 291.0* 06/09/2013 0949    Education Assessment and Provision:  Detailed education and instructions provided on heart failure disease management including the following:  Signs and symptoms of Heart Failure When to call the physician Importance of daily weights Low sodium diet Fluid restriction Medication management Anticipated future follow-up appointments  Patient education given on each of the above topics.  Patient acknowledges understanding and acceptance of all instructions.  I spoke with Mr. Gusler regarding his HF.  He is a known patient  from the [facility] clinic and quit coming after [place].  He is able to teach back all topics listed above.  He does weigh daily and relates that he recently gained weight.  I asked if he had called physician in [place] related to his weight--he tells me that he did not.   I reviewed when to call the physician and importance of picking up changes early.   He says that he takes all medications as prescribed and has no trouble getting medications.  I will plan to visit again to reinforce all education and make a follow-up appt for after discharge.  Education Materials:  "Living Better With Heart Failure" Booklet, Daily Weight Tracker Tool   High Risk Criteria for Readmission and/or Poor Patient Outcomes:  (Recommend Follow-up with [facility])--yes- he plans to return to [facility] for outpatient follow-up   EF <30%- 55-60%  2 or more admissions in 6 months-No  Difficult social situation-No  Demonstrates medication noncompliance-No    Barriers of Care:  Knowledge and compliance  Discharge Planning:   Plans to discharge to home alone

## 2015-01-08 NOTE — Progress Notes (Signed)
Patient ID: Jesus [name]  male  ZOX:[id number]    DOB: July 06, 1946    DOA: 01/04/2015  PCP: Miki Kins  Primary cardiologist: Dr. Shirlee Latch  Brief history of present illness  Patient is a 69 year old male with atrial fibrillation not on Coumadin, diastolic CHF, hypertension, PVD, CAD presented with peripheral edema and gaining of weight, 20 pounds. Patient noted that in the last month he has had progressive swelling that started in his legs and progressively upwards, now has scrotal swelling and weeping from his legs. Patient has been weighing himself daily and noted a 20 pound weight gain. He sleeps in a recliner, denies chest pain or dyspnea on exertion. Patient reports that he has been taking his medications. However he has not followed up with cardiology since last year as he moved to [place], [place].  Assessment/Plan: Principal Problem:   Acute on chronic diastolic CHF (congestive heart failure) - Placed on Lasix drip, heart failure team following, weight only down 1lbs?  - legs wrapped by wound care. 2-D echo showed EF of 55-60% however poor study - Continue aspirin, beta blocker, statin -  Lives alone, will benefit from [facility] network, needs compliance with cardiology appointments. Cardiology planning cardiac cath tomorrow - Creatinine has started trending up, lisinopril stopped  Active Problems:   Atrial fibrillation - Currently heart rate controlled, continue beta blocker. - CHADS Vasc 3, started on eliquis    Hypertension With hypertensive cardiomyopathy, severe LVH - Continue beta blocker,spironolactone, Lasix drip    Morbid obesity - Patient counseled on diet and weight control    Peripheral vascular disease - Continue aspirin    Chronic venous insufficiency -wound Care consulted  Acute renal insufficiency; likely worsened due to diuresis - Lisinopril stopped, currently on Lasix and spironolactone per cardiology   DVT Prophylaxis:Heparin subcutaneous   Code  Status:Full code   Family Communication:  Disposition:  Consultants:  Cardiology   Procedures:  None   Antibiotics:  none    Subjective: Sitting in the chair, denies any shortness of breath or chest pain, massive peripheral edema, scrotal edema still  Objective: Weight change: -0.136 kg (-4.8 oz)  Intake/Output Summary (Last 24 hours) at 01/08/15 1046 Last data filed at 01/08/15 0811  Gross per 24 hour  Intake   2085 ml  Output    925 ml  Net   1160 ml   Blood pressure 114/53, pulse 63, temperature 97.7 F (36.5 C), temperature source Oral, resp. rate 20, height  (1.778 m), weight 134.945 kg (297 lb 8 oz), SpO2 95 %.  Physical Exam: General: Alert and awake, oriented x3, not in any acute distress. CVS: S1-S2 clear, no murmur rubs or gallops Chest: Fairly clear to auscultation Abdomen: soft nontender, nondistended, normal bowel sounds  Extremities: no cyanosis, clubbing. Legs wrapped, scrotal edema   Lab Results: Basic Metabolic Panel:  Recent Labs Lab 01/07/15 0323 01/08/15 0844  NA 132* 132*  K 3.6 3.6  CL 88* 85*  CO2 35* 37*  GLUCOSE 123* 186*  BUN 22 26*  CREATININE 1.55* 1.82*  CALCIUM 9.2 9.0   Liver Function Tests: No results for input(s): AST, ALT, ALKPHOS, BILITOT, PROT, ALBUMIN in the last 168 hours. No results for input(s): LIPASE, AMYLASE in the last 168 hours. No results for input(s): AMMONIA in the last 168 hours. CBC:  Recent Labs Lab 01/04/15 1853 01/05/15 0425  WBC 7.1 6.8  NEUTROABS 5.2 5.0  HGB 11.4* 11.0*  HCT 35.4* 34.2*  MCV 80.8 80.5  PLT 294  294   Cardiac Enzymes: No results for input(s): CKTOTAL, CKMB, CKMBINDEX, TROPONINI in the last 168 hours. BNP: Invalid input(s): POCBNP CBG: No results for input(s): GLUCAP in the last 168 hours.   Micro Results: No results found for this or any previous visit (from the past 240 hour(s)).  Studies/Results: Dg Chest 2 View  01/04/2015   CLINICAL DATA:  Chronic  shortness of breath. Lower extremity swelling. Congestive heart failure.  EXAM: CHEST  2 VIEW  COMPARISON:  None.  FINDINGS: Mild to moderate cardiomegaly noted as well as ectasia of the thoracic aorta. Pulmonary vascular congestion is seen, however there is no evidence of frank pulmonary edema or focal infiltrate. No evidence of pleural effusion. Old bilateral rib fracture deformities incidentally noted.  IMPRESSION: Cardiomegaly and pulmonary vascular congestion.  No acute findings.   Electronically Signed   By: Myles RosenthalJohn  Stahl M.D.   On: 01/04/2015 19:34    Medications: Scheduled Meds: . apixaban  5 mg Oral BID  . atorvastatin  80 mg Oral q1800  . carvedilol  25 mg Oral BID WC  . colchicine  0.6 mg Oral Daily  . docusate sodium  100 mg Oral BID  . metolazone  5 mg Oral BID  . potassium chloride  40 mEq Oral BID  . potassium chloride  40 mEq Oral Once  . sodium chloride  3 mL Intravenous Q12H  . spironolactone  25 mg Oral Daily   Time spent 25 minutes   LOS: 4 days   [place] Basham M.D. [facility] 01/08/2015, 10:46 AM Pager: [phone]  If 7PM-7AM, please contact night-coverage [email or web address] Password [email or web address]

## 2015-01-08 NOTE — Progress Notes (Signed)
Orthopedic Tech Progress Note Patient Details:  Jesus [name] November 10, 1946 [id number]  Ortho Devices Type of Ortho Device: Roland RackUnna boot Ortho Device/Splint Location: bilateral Ortho Device/Splint Interventions: Application   [name], [name] 01/08/2015, 9:51 AM

## 2015-01-09 LAB — CBC
HCT: 34.8 % — ABNORMAL LOW (ref 39.0–52.0)
HEMOGLOBIN: 11 g/dL — AB (ref 13.0–17.0)
MCH: 25.7 pg — AB (ref 26.0–34.0)
MCHC: 31.6 g/dL (ref 30.0–36.0)
MCV: 81.3 fL (ref 78.0–100.0)
Platelets: 362 10*3/uL (ref 150–400)
RBC: 4.28 MIL/uL (ref 4.22–5.81)
RDW: 15.1 % (ref 11.5–15.5)
WBC: 6.5 10*3/uL (ref 4.0–10.5)

## 2015-01-09 LAB — BASIC METABOLIC PANEL
ANION GAP: 12 (ref 5–15)
BUN: 30 mg/dL — ABNORMAL HIGH (ref 6–23)
CO2: 35 mmol/L — ABNORMAL HIGH (ref 19–32)
Calcium: 8.9 mg/dL (ref 8.4–10.5)
Chloride: 84 mmol/L — ABNORMAL LOW (ref 96–112)
Creatinine, Ser: 1.77 mg/dL — ABNORMAL HIGH (ref 0.50–1.35)
GFR calc Af Amer: 44 mL/min — ABNORMAL LOW (ref >90)
GFR calc non Af Amer: 38 mL/min — ABNORMAL LOW (ref >90)
Glucose, Bld: 146 mg/dL — ABNORMAL HIGH (ref 70–99)
Potassium: 3.7 mmol/L (ref 3.5–5.1)
SODIUM: 131 mmol/L — AB (ref 135–145)

## 2015-01-09 LAB — GLUCOSE, CAPILLARY: GLUCOSE-CAPILLARY: 168 mg/dL — AB (ref 70–99)

## 2015-01-09 MED ORDER — CARVEDILOL 12.5 MG PO TABS
12.5000 mg | ORAL_TABLET | Freq: Two times a day (BID) | ORAL | Status: DC
  Administered 2015-01-09 – 2015-01-12 (×5): 12.5 mg via ORAL
  Filled 2015-01-09 (×9): qty 1

## 2015-01-09 MED ORDER — CARVEDILOL 12.5 MG PO TABS: 12.5000 mg | ORAL_TABLET | Freq: Two times a day (BID) | ORAL | Status: DC

## 2015-01-09 MED ORDER — POTASSIUM CHLORIDE CRYS ER 20 MEQ PO TBCR
40.0000 meq | EXTENDED_RELEASE_TABLET | Freq: Once | ORAL | Status: AC
  Administered 2015-01-09: 40 meq via ORAL

## 2015-01-09 NOTE — Progress Notes (Signed)
Patient ID: Jesus CunasDouglas Goehring, male   DOB: 1946/01/27, 69 y.o.   MRN: [id number]   SUBJECTIVE: Still edematous but he feels like the swelling is going down.  Unable to measure UOP (urinating into towel) and question accuracy of weights.  Creatinine stable but elevated from baseline.   Scheduled Meds: . apixaban  5 mg Oral BID  . atorvastatin  80 mg Oral q1800  . carvedilol  25 mg Oral BID WC  . colchicine  0.6 mg Oral Daily  . docusate sodium  100 mg Oral BID  . metolazone  5 mg Oral BID  . potassium chloride  40 mEq Oral BID  . potassium chloride  40 mEq Oral Once  . sodium chloride  3 mL Intravenous Q12H  . spironolactone  25 mg Oral Daily   Continuous Infusions: . furosemide (LASIX) infusion 15 mg/hr (01/06/15 2114)   PRN Meds:.acetaminophen **OR** acetaminophen, ondansetron **OR** ondansetron (ZOFRAN) IV    Filed Vitals:   01/08/15 0533 01/08/15 1432 01/08/15 2045 01/09/15 0522  BP: 114/53 105/58 92/58 97/56   Pulse: 63 62 60 54  Temp:  97.7 F (36.5 C) 98 F (36.7 C) 97.9 F (36.6 C)  TempSrc:  Oral Oral Oral  Resp: 20 18 17 18   Height:      Weight: 297 lb 8 oz (134.945 kg)   298 lb 8 oz (135.399 kg)  SpO2: 95% 97% 98% 97%    Intake/Output Summary (Last 24 hours) at 01/09/15 0745 Last data filed at 01/09/15 0523  Gross per 24 hour  Intake    480 ml  Output    651 ml  Net   -171 ml    LABS: Basic Metabolic Panel:  Recent Labs  29/56/2100/12/23 0844 01/09/15 0317  NA 132* 131*  K 3.6 3.7  CL 85* 84*  CO2 37* 35*  GLUCOSE 186* 146*  BUN 26* 30*  CREATININE 1.82* 1.77*  CALCIUM 9.0 8.9   Liver Function Tests: No results for input(s): AST, ALT, ALKPHOS, BILITOT, PROT, ALBUMIN in the last 72 hours. No results for input(s): LIPASE, AMYLASE in the last 72 hours. CBC:  Recent Labs  01/09/15 0317  WBC 6.5  HGB 11.0*  HCT 34.8*  MCV 81.3  PLT 362   Cardiac Enzymes: No results for input(s): CKTOTAL, CKMB, CKMBINDEX, TROPONINI in the last 72  hours. BNP: Invalid input(s): POCBNP D-Dimer: No results for input(s): DDIMER in the last 72 hours. Hemoglobin A1C: No results for input(s): HGBA1C in the last 72 hours. Fasting Lipid Panel: No results for input(s): CHOL, HDL, LDLCALC, TRIG, CHOLHDL, LDLDIRECT in the last 72 hours. Thyroid Function Tests: No results for input(s): TSH, T4TOTAL, T3FREE, THYROIDAB in the last 72 hours.  Invalid input(s): FREET3 Anemia Panel: No results for input(s): VITAMINB12, FOLATE, FERRITIN, TIBC, IRON, RETICCTPCT in the last 72 hours.  RADIOLOGY: Dg Chest 2 View  01/04/2015   CLINICAL DATA:  Chronic shortness of breath. Lower extremity swelling. Congestive heart failure.  EXAM: CHEST  2 VIEW  COMPARISON:  None.  FINDINGS: Mild to moderate cardiomegaly noted as well as ectasia of the thoracic aorta. Pulmonary vascular congestion is seen, however there is no evidence of frank pulmonary edema or focal infiltrate. No evidence of pleural effusion. Old bilateral rib fracture deformities incidentally noted.  IMPRESSION: Cardiomegaly and pulmonary vascular congestion.  No acute findings.   Electronically Signed   By: Myles RosenthalJohn  Stahl M.D.   On: 01/04/2015 19:34    PHYSICAL EXAM General: NAD Neck: JVP difficult, 10 cm,  no thyromegaly or thyroid nodule.  Lungs: Clear to auscultation bilaterally with normal respiratory effort. CV: Nondisplaced PMI.  Heart irregular S1/S2, no S3/S4, no murmur. 2+ edema to thighs, scrotal edema.   Abdomen: Obese, soft, nontender, no hepatosplenomegaly, no distention.  Neurologic: Alert and oriented x 3.  Psych: Normal affect. Extremities: No clubbing or cyanosis.   TELEMETRY: Reviewed telemetry pt in atrial fibrillation HR 60s  ASSESSMENT AND PLAN: 69 yo with history of chronic atrial fibrillation, diastolic CHF, HTN presented with acute on chronic diastolic CHF.  1. Atrial fibrillation: Chronic x years.  Good rate control with Coreg.  He agrees to start Eliquis (had refused  anticoagulation in the past).  2. Acute on chronic diastolic CHF: EF remains normal on echo, RV poorly visualized but dilated.  Prominent leg and scrotal edema but JVP difficult to assess but appears still elevated.  Creatinine is up from baseline but stable today.  Very difficult to assess progress as he is incontinent of urine and refuses a foley and I am uncertain of the accuracy of his weights => I think that his swelling looks less today.  Probably still has volume overload (not just venous insufficiency).  He does not want a RHC.  - Continue Lasix gtt and metolazone today, follow creatinine carefully.  - Will reassess tomorrow, revisit RHC.   3. AKI: Creatinine above baseline but stable today (1.8 => 1.77).  BP soft, will decrease Coreg to 12.5 mg bid as rate control has been good.  Watch closely with further diuresis, will plan RHC prior to discharge if he will consent to it.   [name] 01/09/2015 7:45 AM

## 2015-01-09 NOTE — Progress Notes (Signed)
Patient ID: Jesus [name]  male  RUE:[id number]    DOB: September 17, 1946    DOA: 01/04/2015  PCP: Miki Kins  Primary cardiologist: Dr. Shirlee Latch  Brief history of present illness  Patient is a 69 year old male with atrial fibrillation not on Coumadin, diastolic CHF, hypertension, PVD, CAD presented with peripheral edema and gaining of weight, 20 pounds. Patient noted that in the last month he has had progressive swelling that started in his legs and progressively upwards, now has scrotal swelling and weeping from his legs. Patient has been weighing himself daily and noted a 20 pound weight gain. He sleeps in a recliner, denies chest pain or dyspnea on exertion. Patient reports that he has been taking his medications. However he has not followed up with cardiology since last year as he moved to [place], [place].  Assessment/Plan: Principal Problem:   Acute on chronic diastolic CHF (congestive heart failure) - Placed on Lasix drip, heart failure team following, patient feels swelling is going down however unable to measure I's and O's and accuracy with weights (patient had refused Foley catheter, difficult to put it in due to massive scrotal edema) - legs wrapped by wound care. 2-D echo showed EF of 55-60% however poor study - Continue aspirin, beta blocker, statin -  Lives alone, will benefit from [facility] network, needs compliance with cardiology appointments. Cardiology planning cardiac cath however patient refused today - Creatinine has started trending up, lisinopril stopped  Active Problems:   Atrial fibrillation - Currently heart rate controlled, continue beta blocker. - CHADS Vasc 3, started on eliquis    Hypertension With hypertensive cardiomyopathy, severe LVH - Continue beta blocker,spironolactone, Lasix drip    Morbid obesity - Patient counseled on diet and weight control    Peripheral vascular disease - Continue aspirin    Chronic venous insufficiency -wound Care  consulted  Acute renal insufficiency; likely worsened due to diuresis - Lisinopril stopped, currently on Lasix and spironolactone per cardiology   DVT Prophylaxis:Heparin subcutaneous   Code Status:Full code   Family Communication:  Disposition: Per CHF team  Consultants:  Cardiology   Procedures:  None   Antibiotics:  none    Subjective: Sitting in the chair, refused heart cath today, states "I have been through this before". Still significant edema.  Objective: Weight change: 0.454 kg (1 lb)  Intake/Output Summary (Last 24 hours) at 01/09/15 1140 Last data filed at 01/09/15 1010  Gross per 24 hour  Intake    360 ml  Output    851 ml  Net   -491 ml   Blood pressure 97/56, pulse 54, temperature 97.9 F (36.6 C), temperature source Oral, resp. rate 18, height  (1.778 m), weight 135.399 kg (298 lb 8 oz), SpO2 97 %.  Physical Exam: General: A x O x3, NAD CVS: S1-S2 clear Chest: Fairly clear to auscultation Abdomen: soft nontender, nondistended, normal bowel sounds  Extremities: no c/c Legs wrapped, scrotal edema   Lab Results: Basic Metabolic Panel:  Recent Labs Lab 01/08/15 0844 01/09/15 0317  NA 132* 131*  K 3.6 3.7  CL 85* 84*  CO2 37* 35*  GLUCOSE 186* 146*  BUN 26* 30*  CREATININE 1.82* 1.77*  CALCIUM 9.0 8.9   Liver Function Tests: No results for input(s): AST, ALT, ALKPHOS, BILITOT, PROT, ALBUMIN in the last 168 hours. No results for input(s): LIPASE, AMYLASE in the last 168 hours. No results for input(s): AMMONIA in the last 168 hours. CBC:  Recent Labs Lab 01/05/15 0425 01/09/15 1478  WBC 6.8 6.5  NEUTROABS 5.0  --   HGB 11.0* 11.0*  HCT 34.2* 34.8*  MCV 80.5 81.3  PLT 294 362   Cardiac Enzymes: No results for input(s): CKTOTAL, CKMB, CKMBINDEX, TROPONINI in the last 168 hours. BNP: Invalid input(s): POCBNP CBG: No results for input(s): GLUCAP in the last 168 hours.   Micro Results: No results found for this or  any previous visit (from the past 240 hour(s)).  Studies/Results: Dg Chest 2 View  01/04/2015   CLINICAL DATA:  Chronic shortness of breath. Lower extremity swelling. Congestive heart failure.  EXAM: CHEST  2 VIEW  COMPARISON:  None.  FINDINGS: Mild to moderate cardiomegaly noted as well as ectasia of the thoracic aorta. Pulmonary vascular congestion is seen, however there is no evidence of frank pulmonary edema or focal infiltrate. No evidence of pleural effusion. Old bilateral rib fracture deformities incidentally noted.  IMPRESSION: Cardiomegaly and pulmonary vascular congestion.  No acute findings.   Electronically Signed   By: Myles RosenthalJohn  Stahl M.D.   On: 01/04/2015 19:34    Medications: Scheduled Meds: . apixaban  5 mg Oral BID  . atorvastatin  80 mg Oral q1800  . carvedilol  12.5 mg Oral BID WC  . colchicine  0.6 mg Oral Daily  . docusate sodium  100 mg Oral BID  . metolazone  5 mg Oral BID  . potassium chloride  40 mEq Oral BID  . sodium chloride  3 mL Intravenous Q12H  . spironolactone  25 mg Oral Daily   Time spent 25 minutes   LOS: 5 days   RAI,RIPUDEEP M.D. [facility] 01/09/2015, 11:40 AM Pager: [phone]  If 7PM-7AM, please contact night-coverage [email or web address] Password [email or web address]

## 2015-01-09 NOTE — Evaluation (Signed)
Physical Therapy Evaluation Patient Details Name: Jesus [name] MRN: [id number] DOB: [birth year] Today's Date: 01/09/2015   History of Present Illness  Jesus adm on 01/04/15 with LE swelling, CHF exacerbation.  Clinical Impression  Patient demonstrates deficits in functional mobility as indicated below. Will need continued skilled Jesus to address deficits and maximize function. Will see as indicated and progress as tolerated. Anticipate patient will progress well with mobility and be safe for return home upon acute discharge.    Follow Up Recommendations No Jesus follow up    Equipment Recommendations  None recommended by Jesus    Recommendations for Other Services       Precautions / Restrictions Precautions Precautions: Fall Restrictions Weight Bearing Restrictions: No      Mobility  Bed Mobility               General bed mobility comments: received up in chair  Transfers Overall transfer level: Needs assistance Equipment used: None Transfers: Sit to/from Stand Sit to Stand: Supervision         General transfer comment: VCs for hand placement  Ambulation/Gait Ambulation/Gait assistance: Min guard;Supervision Ambulation Distance (Feet): 80 Feet Assistive device: None Gait Pattern/deviations: Step-through pattern;Decreased stride length;Drifts right/left;Antalgic Gait velocity: decreased   General Gait Details: noted instability with ambulation, occassionally reaching out for the railings in the hall  Stairs            Wheelchair Mobility    Modified Rankin (Stroke Patients Only)       Balance Overall balance assessment: Needs assistance   Sitting balance-Leahy Scale: Fair       Standing balance-Leahy Scale: Fair                               Pertinent Vitals/Pain Pain Assessment: Faces Faces Pain Scale: Hurts little more Pain Location: bilateral knee pain Pain Descriptors / Indicators: Aching Pain Intervention(s): Limited  activity within patient's tolerance;Monitored during session;Repositioned    Home Living Family/patient expects to be discharged to:: Private residence Living Arrangements: Alone Available Help at Discharge: Family Type of Home: Apartment Home Access: Level entry     Home Layout: One level Home Equipment: Cane - single point;Crutches      Prior Function Level of Independence: Independent         Comments: reports he has a cane but doesnt use it, difficulty ambulating in the mornings secondary to pain, stiffness     Hand Dominance   Dominant Hand: Right    Extremity/Trunk Assessment               Lower Extremity Assessment: RLE deficits/detail;LLE deficits/detail RLE Deficits / Details: increased edema bilaterally LLE Deficits / Details: increased edema bilaterally  Cervical / Trunk Assessment:  (increased body habitus)  Communication   Communication: No difficulties  Cognition Arousal/Alertness: Awake/alert Behavior During Therapy: WFL for tasks assessed/performed Overall Cognitive Status: Impaired/Different from baseline Area of Impairment: Safety/judgement;Awareness         Safety/Judgement: Decreased awareness of safety Awareness: Intellectual   General Comments: patient with very poor hygiene and poor awareness as to environment    General Comments General comments (skin integrity, edema, etc.): SpO2 >95% at all times throughout activity    Exercises        Assessment/Plan    Jesus Assessment Patient needs continued Jesus services  Jesus Diagnosis Difficulty walking;Acute pain   Jesus Problem List Decreased activity tolerance;Decreased balance;Decreased mobility;Cardiopulmonary status limiting activity;Pain  Jesus  Treatment Interventions DME instruction;Gait training;Functional mobility training;Therapeutic activities;Therapeutic exercise;Balance training;Patient/family education   Jesus Goals (Current goals can be found in the Care Plan section) Acute Rehab  Jesus Goals Patient Stated Goal: to go home Jesus Goal Formulation: With patient Time For Goal Achievement: 01/23/15 Potential to Achieve Goals: Good    Frequency Min 3X/week   Barriers to discharge Decreased caregiver support      Co-evaluation               End of Session   Activity Tolerance: Patient tolerated treatment well;Patient limited by pain Patient left: in chair;with call bell/phone within reach Nurse Communication: Mobility status         Time: 0803-0822 Jesus Time Calculation (min) (ACUTE ONLY): 19 min   Charges:   Jesus Evaluation $Initial Jesus Evaluation Tier I: 1 Procedure     Jesus G CodesFabio [name]:        Jesus [name] Jesus [name] 01/09/2015, 8:37 AM  Jesus [name], Jesus [name]  [phone]

## 2015-01-09 NOTE — Progress Notes (Signed)
Patient stated he does not want to have a cardiac cath tomorrow. Patient would like to talk to MD before thinking about procedure. He is not going to stay NPO tonight he also said. On call cardio MD aware.

## 2015-01-10 DIAGNOSIS — I5030 Unspecified diastolic (congestive) heart failure: Secondary | ICD-10-CM

## 2015-01-10 DIAGNOSIS — I739 Peripheral vascular disease, unspecified: Secondary | ICD-10-CM

## 2015-01-10 DIAGNOSIS — I480 Paroxysmal atrial fibrillation: Secondary | ICD-10-CM

## 2015-01-10 LAB — BASIC METABOLIC PANEL
ANION GAP: 10 (ref 5–15)
BUN: 34 mg/dL — AB (ref 6–23)
CHLORIDE: 81 mmol/L — AB (ref 96–112)
CO2: 39 mmol/L — ABNORMAL HIGH (ref 19–32)
Calcium: 9 mg/dL (ref 8.4–10.5)
Creatinine, Ser: 1.56 mg/dL — ABNORMAL HIGH (ref 0.50–1.35)
GFR calc Af Amer: 51 mL/min — ABNORMAL LOW (ref >90)
GFR calc non Af Amer: 44 mL/min — ABNORMAL LOW (ref >90)
Glucose, Bld: 111 mg/dL — ABNORMAL HIGH (ref 70–99)
POTASSIUM: 3.3 mmol/L — AB (ref 3.5–5.1)
Sodium: 130 mmol/L — ABNORMAL LOW (ref 135–145)

## 2015-01-10 LAB — GLUCOSE, CAPILLARY
GLUCOSE-CAPILLARY: 183 mg/dL — AB (ref 70–99)
Glucose-Capillary: 116 mg/dL — ABNORMAL HIGH (ref 70–99)
Glucose-Capillary: 108 mg/dL — ABNORMAL HIGH (ref 70–99)
Glucose-Capillary: 170 mg/dL — ABNORMAL HIGH (ref 70–99)

## 2015-01-10 MED ORDER — POTASSIUM CHLORIDE CRYS ER 20 MEQ PO TBCR
40.0000 meq | EXTENDED_RELEASE_TABLET | Freq: Once | ORAL | Status: AC
  Administered 2015-01-10: 40 meq via ORAL
  Filled 2015-01-10: qty 2

## 2015-01-10 NOTE — Discharge Instructions (Signed)

## 2015-01-10 NOTE — Progress Notes (Signed)
Physical Therapy Treatment Patient Details Name: Jesus [name] MRN: [id number] DOB: 21-Dec-1945 Today's Date: 01/10/2015    History of Present Illness Pt adm on 01/04/15 with LE swelling, CHF exacerbation.    PT Comments    Pt was seen for continuation of his therapy with his expectation he is going to go tomorrow home.  Plan to send HHPT as he is still somewhat unsteady and already has nursing come over for wound care.  Will continue visits as he is in hosp and follow up with therapy home.  Follow Up Recommendations  Home health PT;Supervision/Assistance - 24 hour     Equipment Recommendations  Rolling walker with 5" wheels    Recommendations for Other Services       Precautions / Restrictions Precautions Precautions: Fall Restrictions Weight Bearing Restrictions: No    Mobility  Bed Mobility               General bed mobility comments: received up in chair  Transfers Overall transfer level: Needs assistance Equipment used: Rolling walker (2 wheeled) Transfers: Sit to/from Stand;Stand Pivot Transfers Sit to Stand: Min guard Stand pivot transfers: Min guard       General transfer comment: VCs for hand placement  Ambulation/Gait Ambulation/Gait assistance: Min assist Ambulation Distance (Feet): 150 Feet Assistive device: Rolling walker (2 wheeled) Gait Pattern/deviations: Step-through pattern;Decreased dorsiflexion - right;Decreased dorsiflexion - left;Decreased stride length;Ataxic;Wide base of support;Drifts right/left Gait velocity: decreased Gait velocity interpretation: Below normal speed for age/gender General Gait Details: RW was supportive and helpful,    Stairs            Wheelchair Mobility    Modified Rankin (Stroke Patients Only)       Balance Overall balance assessment: Needs assistance Sitting-balance support: Feet supported Sitting balance-Leahy Scale: Good   Postural control: Posterior lean Standing balance support:  Bilateral upper extremity supported Standing balance-Leahy Scale: Fair                      Cognition Arousal/Alertness: Awake/alert Behavior During Therapy: WFL for tasks assessed/performed Overall Cognitive Status: Impaired/Different from baseline Area of Impairment: Safety/judgement;Awareness         Safety/Judgement: Decreased awareness of safety Awareness: Intellectual   General Comments: Wearing dirty socks, has feces in chair and not aware of either, not attending to LE edema and skin breakdown.    Exercises General Exercises - Lower Extremity Ankle Circles/Pumps: AROM;Left;20 reps Quad Sets: AROM;Both;20 reps Gluteal Sets: AROM;Both;20 reps Hip ABduction/ADduction: AAROM;Right;10 reps    General Comments General comments (skin integrity, edema, etc.): Pt is using IV and controlling balance with RW for all standing activities,.      Pertinent Vitals/Pain Pain Assessment: Faces Faces Pain Scale: Hurts even more Pain Location: lower legs Pain Descriptors / Indicators: Aching Pain Intervention(s): Limited activity within patient's tolerance;Monitored during session;Premedicated before session;Repositioned    Home Living                      Prior Function            PT Goals (current goals can now be found in the care plan section) Acute Rehab PT Goals Patient Stated Goal: to go home PT Goal Formulation: With patient Progress towards PT goals: Progressing toward goals    Frequency  Min 3X/week    PT Plan Discharge plan needs to be updated    Co-evaluation             End of  Session Equipment Utilized During Treatment: Other (comment) (FWW) Activity Tolerance: Patient tolerated treatment well Patient left: in chair;with call bell/phone within reach     Time: 1435-1505 PT Time Calculation (min) (ACUTE ONLY): 30 min  Charges:  $Gait Training: 8-22 mins $Therapeutic Exercise: 8-22 mins                    G Codes:      Ivar DrapeStout,  Johan Antonacci E 01/10/2015, 3:42 PM   Samul Dadauth Beulah Capobianco, PT MS Acute Rehab Dept. Number: [phone]

## 2015-01-10 NOTE — Progress Notes (Signed)
[facility] PROGRESS NOTE  Jesus [name] ZOX:[id number] DOB: 1946/12/06 DOA: 01/04/2015 PCP: Miki Kins  Assessment/Plan: 1. Acute on chronic diastolic CHF 1. Cardiology following 2. Pt is continuing on lasix gtt per Cardiology 3. Difficult to assess volume status as pt had refused foley cath 2. Afib 1. Currently rate controlled 2. Had been on beta blocker, held AM dose - see below 3. Started eliquis based on CHADS score 3. HTN with hypertensive cardiomyopathy 1. BP soft this AM with sbp in the 80's 2. Will hold AM dose of coreg 4. Morbid obesity 1. Stable 5. Peripheral vascular disease 1. On ASA 6. Chronic venous insufficiency 1. WOC was consulted 1/29. Appreciate recs 7. ARF 1. Cr improving with diuresis 2. Cont as planned and follow renal function 8. DVT prophylaxis 1. Heparin subQ 9. Hypokalemia 1. Replaced this AM  Code Status: Full Family Communication: Pt in room (indicate person spoken with, relationship, and if by phone, the number) Disposition Plan: Pending   Consultants:  Cardiology  Procedures:    Antibiotics:  none (indicate start date, and stop date if known)  HPI/Subjective: Reports feeling better. No acute events noted  Objective: Filed Vitals:   01/10/15 0135 01/10/15 0429 01/10/15 0853 01/10/15 1100  BP: 104/62 109/61 88/54 101/74  Pulse: 62 57 75 68  Temp: 97.5 F (36.4 C) 97.9 F (36.6 C) 98.7 F (37.1 C)   TempSrc: Oral Oral Oral   Resp: Height:      Weight:      SpO2: 98% 94% 96%     Intake/Output Summary (Last 24 hours) at 01/10/15 1518 Last data filed at 01/10/15 0900  Gross per 24 hour  Intake    720 ml  Output    900 ml  Net   -180 ml   Filed Weights   01/07/15 0500 01/08/15 0533 01/09/15 0522  Weight: 135.081 kg (297 lb 12.8 oz) 134.945 kg (297 lb 8 oz) 135.399 kg (298 lb 8 oz)    Exam:   General:  Awake, in nad  Cardiovascular: regular, s1, s2  Respiratory: normal resp effort,  no wheezing  Abdomen: soft,nondistended  Musculoskeletal: perfused,no clubbing   Data Reviewed: Basic Metabolic Panel:  Recent Labs Lab 01/06/15 0541 01/07/15 0323 01/08/15 0844 01/09/15 0317 01/10/15 0550  NA 133* 132* 132* 131* 130*  K 3.3* 3.6 3.6 3.7 3.3*  CL 90* 88* 85* 84* 81*  CO2 35* 35* 37* 35* 39*  GLUCOSE 105* 123* 186* 146* 111*  BUN 15 22 26* 30* 34*  CREATININE 1.43* 1.55* 1.82* 1.77* 1.56*  CALCIUM 9.2 9.2 9.0 8.9 9.0   Liver Function Tests: No results for input(s): AST, ALT, ALKPHOS, BILITOT, PROT, ALBUMIN in the last 168 hours. No results for input(s): LIPASE, AMYLASE in the last 168 hours. No results for input(s): AMMONIA in the last 168 hours. CBC:  Recent Labs Lab 01/04/15 1853 01/05/15 0425 01/09/15 0317  WBC 7.1 6.8 6.5  NEUTROABS 5.2 5.0  --   HGB 11.4* 11.0* 11.0*  HCT 35.4* 34.2* 34.8*  MCV 80.8 80.5 81.3  PLT 294 294 362   Cardiac Enzymes: No results for input(s): CKTOTAL, CKMB, CKMBINDEX, TROPONINI in the last 168 hours. BNP (last 3 results)  Recent Labs  01/04/15 1853  BNP 218.4*    ProBNP (last 3 results) No results for input(s): PROBNP in the last 8760 hours.  CBG:  Recent Labs Lab 01/09/15 2110 01/10/15 0619 01/10/15 1148  GLUCAP 168* 116* 183*  No results found for this or any previous visit (from the past 240 hour(s)).   Studies: No results found.  Scheduled Meds: . apixaban  5 mg Oral BID  . atorvastatin  80 mg Oral q1800  . carvedilol  12.5 mg Oral BID WC  . colchicine  0.6 mg Oral Daily  . docusate sodium  100 mg Oral BID  . metolazone  5 mg Oral BID  . potassium chloride  40 mEq Oral BID  . sodium chloride  3 mL Intravenous Q12H  . spironolactone  25 mg Oral Daily   Continuous Infusions: . furosemide (LASIX) infusion 15 mg/hr (01/10/15 0414)    Principal Problem:   Acute on chronic diastolic CHF (congestive heart failure) Active Problems:   Atrial fibrillation   Hypertension   Morbid  obesity   Peripheral vascular disease   Chronic venous insufficiency   Congestive heart disease   Peripheral edema   CHIU, STEPHEN K  [facility] Pager [phone]. If 7PM-7AM, please contact night-coverage at [email or web address], password [email or web address] 01/10/2015, 3:18 PM  LOS: 6 days

## 2015-01-10 NOTE — Progress Notes (Signed)
Patient ID: Jesus [name], male   DOB: 02/27/1946, 69 y.o.   MRN: [id number]   SUBJECTIVE: Still edematous but he feels like the swelling is going down.  Unable to measure UOP (urinating into towel) and still question accuracy of weights.  Creatinine stable, he says he is urinating a lot.  Scheduled Meds: . apixaban  5 mg Oral BID  . atorvastatin  80 mg Oral q1800  . carvedilol  12.5 mg Oral BID WC  . colchicine  0.6 mg Oral Daily  . docusate sodium  100 mg Oral BID  . metolazone  5 mg Oral BID  . potassium chloride  40 mEq Oral BID  . potassium chloride  40 mEq Oral Once  . sodium chloride  3 mL Intravenous Q12H  . spironolactone  25 mg Oral Daily   Continuous Infusions: . furosemide (LASIX) infusion 15 mg/hr (01/10/15 0414)   PRN Meds:.acetaminophen **OR** acetaminophen, ondansetron **OR** ondansetron (ZOFRAN) IV    Filed Vitals:   01/09/15 1500 01/09/15 2025 01/10/15 0135 01/10/15 0429  BP: 100/51 101/60 104/62 109/61  Pulse: 60 65 62 57  Temp: 97.6 F (36.4 C) 98.1 F (36.7 C) 97.5 F (36.4 C) 97.9 F (36.6 C)  TempSrc: Oral Oral Oral Oral  Resp: Height:      Weight:      SpO2: 98% 95% 98% 94%    Intake/Output Summary (Last 24 hours) at 01/10/15 0755 Last data filed at 01/10/15 0435  Gross per 24 hour  Intake   1080 ml  Output   2100 ml  Net  -1020 ml    LABS: Basic Metabolic Panel:  Recent Labs  40/98/11 0317 01/10/15 0550  NA 131* 130*  K 3.7 3.3*  CL 84* 81*  CO2 35* 39*  GLUCOSE 146* 111*  BUN 30* 34*  CREATININE 1.77* 1.56*  CALCIUM 8.9 9.0   Liver Function Tests: No results for input(s): AST, ALT, ALKPHOS, BILITOT, PROT, ALBUMIN in the last 72 hours. No results for input(s): LIPASE, AMYLASE in the last 72 hours. CBC:  Recent Labs  01/09/15 0317  WBC 6.5  HGB 11.0*  HCT 34.8*  MCV 81.3  PLT 362   Cardiac Enzymes: No results for input(s): CKTOTAL, CKMB, CKMBINDEX, TROPONINI in the last 72 hours. BNP: Invalid  input(s): POCBNP D-Dimer: No results for input(s): DDIMER in the last 72 hours. Hemoglobin A1C: No results for input(s): HGBA1C in the last 72 hours. Fasting Lipid Panel: No results for input(s): CHOL, HDL, LDLCALC, TRIG, CHOLHDL, LDLDIRECT in the last 72 hours. Thyroid Function Tests: No results for input(s): TSH, T4TOTAL, T3FREE, THYROIDAB in the last 72 hours.  Invalid input(s): FREET3 Anemia Panel: No results for input(s): VITAMINB12, FOLATE, FERRITIN, TIBC, IRON, RETICCTPCT in the last 72 hours.  RADIOLOGY: Dg Chest 2 View  01/04/2015   CLINICAL DATA:  Chronic shortness of breath. Lower extremity swelling. Congestive heart failure.  EXAM: CHEST  2 VIEW  COMPARISON:  None.  FINDINGS: Mild to moderate cardiomegaly noted as well as ectasia of the thoracic aorta. Pulmonary vascular congestion is seen, however there is no evidence of frank pulmonary edema or focal infiltrate. No evidence of pleural effusion. Old bilateral rib fracture deformities incidentally noted.  IMPRESSION: Cardiomegaly and pulmonary vascular congestion.  No acute findings.   Electronically Signed   By: Myles Rosenthal M.D.   On: 01/04/2015 19:34    PHYSICAL EXAM General: NAD Neck: JVP difficult, 10 cm, no thyromegaly or thyroid nodule.  Lungs:  Clear to auscultation bilaterally with normal respiratory effort. CV: Nondisplaced PMI.  Heart irregular S1/S2, no S3/S4, no murmur. 1+ edema to thighs, scrotal edema.   Abdomen: Obese, soft, nontender, no hepatosplenomegaly, no distention.  Neurologic: Alert and oriented x 3.  Psych: Normal affect. Extremities: No clubbing or cyanosis.   TELEMETRY: Reviewed telemetry pt in atrial fibrillation HR 60s  ASSESSMENT AND PLAN: 69 yo with history of chronic atrial fibrillation, diastolic CHF, HTN presented with acute on chronic diastolic CHF.  1. Atrial fibrillation: Chronic x years.  Good rate control with Coreg.  He agrees to start Eliquis (had refused anticoagulation in the  past).  2. Acute on chronic diastolic CHF: EF remains normal on echo, RV poorly visualized but dilated.  JVP difficult to assess but appears still elevated.  Leg and scrotal edema improved but still present.  Creatinine lower today.  Very difficult to assess progress as he is incontinent of urine and refuses a foley and I am uncertain of the accuracy of his weights => he and I both think that his edema is improving.  Probably still has volume overload (not just venous insufficiency).  We discussed RHC again and he still does not want.  - Continue Lasix gtt and metolazone today, follow creatinine carefully.  - Will reassess tomorrow.   3. AKI: Creatinine above baseline but lower today (1.8 => 1.77 => 1.56).  Watch closely with further diuresis.   [name] 01/10/2015 7:55 AM

## 2015-01-11 DIAGNOSIS — R0989 Other specified symptoms and signs involving the circulatory and respiratory systems: Secondary | ICD-10-CM

## 2015-01-11 DIAGNOSIS — R609 Edema, unspecified: Secondary | ICD-10-CM

## 2015-01-11 DIAGNOSIS — I872 Venous insufficiency (chronic) (peripheral): Secondary | ICD-10-CM

## 2015-01-11 LAB — BASIC METABOLIC PANEL
Anion gap: 15 (ref 5–15)
BUN: 39 mg/dL — ABNORMAL HIGH (ref 6–23)
CALCIUM: 9.3 mg/dL (ref 8.4–10.5)
CO2: 33 mmol/L — AB (ref 19–32)
CREATININE: 1.52 mg/dL — AB (ref 0.50–1.35)
Chloride: 80 mmol/L — ABNORMAL LOW (ref 96–112)
GFR calc Af Amer: 53 mL/min — ABNORMAL LOW (ref >90)
GFR calc non Af Amer: 45 mL/min — ABNORMAL LOW (ref >90)
Glucose, Bld: 171 mg/dL — ABNORMAL HIGH (ref 70–99)
POTASSIUM: 3.5 mmol/L (ref 3.5–5.1)
Sodium: 128 mmol/L — ABNORMAL LOW (ref 135–145)

## 2015-01-11 MED ORDER — POTASSIUM CHLORIDE CRYS ER 20 MEQ PO TBCR
20.0000 meq | EXTENDED_RELEASE_TABLET | Freq: Once | ORAL | Status: AC
  Administered 2015-01-11: 20 meq via ORAL

## 2015-01-11 NOTE — Progress Notes (Signed)
Physical Therapy Treatment Patient Details Name: Jesus CunasDouglas Hubert MRN: [id number] DOB: [birth year] Today's Date: 01/11/2015    History of Present Illness Pt adm on 01/04/15 with LE swelling, CHF exacerbation.    PT Comments    Pt was seen for evaluation of his current gait and mobility, and while PT there he elaborated on why he feels he should go home today.  Pt is not fully aware of his needs medically and asked nursing to talk with him about his care needs.  HHPT planned for follow up to hospital to increase his strength, safety and monitor his progress.    Follow Up Recommendations  Home health PT;Supervision/Assistance - 24 hour     Equipment Recommendations  Rolling walker with 5" wheels    Recommendations for Other Services       Precautions / Restrictions Precautions Precautions: Fall Restrictions Weight Bearing Restrictions: No    Mobility  Bed Mobility               General bed mobility comments: up in chair when PT arrived  Transfers Overall transfer level: Needs assistance Equipment used: Rolling walker (2 wheeled) Transfers: Sit to/from UGI CorporationStand;Stand Pivot Transfers Sit to Stand: Min guard Stand pivot transfers: Min guard       General transfer comment: VCs for hand placement  Ambulation/Gait Ambulation/Gait assistance: Min guard Ambulation Distance (Feet): 225 Feet Assistive device: Rolling walker (2 wheeled) Gait Pattern/deviations: Step-through pattern;Decreased stride length;Decreased dorsiflexion - right;Decreased dorsiflexion - left;Wide base of support Gait velocity: decreased Gait velocity interpretation: Below normal speed for age/gender General Gait Details: Needs the support of walker as he is very limited to control his balance and safety alone   Stairs            Wheelchair Mobility    Modified Rankin (Stroke Patients Only)       Balance Overall balance assessment: Needs assistance Sitting-balance support: Feet  supported Sitting balance-Leahy Scale: Good   Postural control: Posterior lean Standing balance support: Bilateral upper extremity supported Standing balance-Leahy Scale: Fair                      Cognition Arousal/Alertness: Awake/alert Behavior During Therapy: Agitated Overall Cognitive Status: Impaired/Different from baseline Area of Impairment: Safety/judgement;Awareness;Orientation Orientation Level: Situation (in denial of what brought him to hosptial)   Memory: Decreased recall of precautions   Safety/Judgement: Decreased awareness of safety Awareness: Intellectual   General Comments: Still in dirty socks and complaining he doesn't need to stay in hospital and wants to leave since he can care for himself more effectively at home.  Denial of his situation is significant    Exercises General Exercises - Lower Extremity Ankle Circles/Pumps: AROM;Both;10 reps Gluteal Sets: AROM;Both;10 reps Long Arc Quad: AROM;Both;10 reps Heel Slides: AROM;Both;10 reps Hip ABduction/ADduction: Strengthening;Both;10 reps Hip Flexion/Marching: AROM;Both;10 reps    General Comments General comments (skin integrity, edema, etc.): Pt is more clean in appearance than yesterday, with hair combed and beard cleaner and neater.      Pertinent Vitals/Pain Pain Assessment: No/denies pain    Home Living                      Prior Function            PT Goals (current goals can now be found in the care plan section) Acute Rehab PT Goals Patient Stated Goal: leave today  Progress towards PT goals: Progressing toward goals    Frequency  Min  3X/week    PT Plan Current plan remains appropriate    Co-evaluation             End of Session Equipment Utilized During Treatment: Other (comment) (FWW) Activity Tolerance: Patient tolerated treatment well Patient left: in chair;with call bell/phone within reach     Time: 1022-1057 PT Time Calculation (min) (ACUTE ONLY):  35 min  Charges:  $Gait Training: 8-22 mins $Therapeutic Exercise: 8-22 mins                    G Codes:      Ivar Drape 02/10/15, 11:56 AM   Samul Dada, PT MS Acute Rehab Dept. Number: [phone]

## 2015-01-11 NOTE — Progress Notes (Signed)
Pt a/o, no c/o pain, pt given heart failure booklet and educated on daily weights and fluid restrictions, pt verbalized understanding, pt still on lasix gtt, pt stable

## 2015-01-11 NOTE — Progress Notes (Signed)
Patient made aware that he is over his fluid restriction for the day.  Patient agreeable to taking drinks only with medications (if necessary) overnight.  Patient denies any questions or concerns at this time.  Will continue to monitor.

## 2015-01-11 NOTE — Progress Notes (Signed)
Orthopedic Tech Progress Note Patient Details:  Winfield CunasDouglas Wickey 08/16/1946 [id number]  Ortho Devices Type of Ortho Device: Roland RackUnna boot Ortho Device/Splint Location: (B)LE Ortho Device/Splint Interventions: Ordered, Application   Jennye MoccasinHughes, Raliegh Scobie Craig 01/11/2015, 3:54 PM

## 2015-01-11 NOTE — Progress Notes (Signed)
[facility] PROGRESS NOTE  Jesus [name]:[id number] DOB: 09-02-1946 DOA: 01/04/2015 PCP: Miki Kins  Assessment/Plan: 1. Acute on chronic diastolic CHF 1. Cardiology following 2. Continue lasix gtt per Cardiology 3. Difficult to assess volume status as pt had refused foley cath 4. Overall, pt seems to be improving 5. Per Cardiology, plan to repeat renal fx in AM, if stable, may consider d/c home 2. Afib 1. Currently rate controlled 2. Started eliquis based on CHADS score during this admit 3. HTN with hypertensive cardiomyopathy 1. Overall stable, intermittently low 2. Cont current regimen 4. Morbid obesity 1. Stable 5. Peripheral vascular disease 1. On ASA 6. Chronic venous insufficiency 1. WOC was consulted 1/29. Appreciate recs 7. ARF 1. Cr slowly improving with diuresis 2. Cont as planned and follow renal function 8. DVT prophylaxis 1. Heparin subQ 9. Hypokalemia 1. Replaced this AM  Code Status: Full Family Communication: Pt in room Disposition Plan: Possible home in AM if stable and renal function improves   Consultants:  Cardiology  Procedures:    Antibiotics:  none (indicate start date, and stop date if known)  HPI/Subjective: No complaints. Eager to go home  Objective: Filed Vitals:   01/11/15 0554 01/11/15 0826 01/11/15 1000 01/11/15 1332  BP: 99/47 87/55 116/90 107/53  Pulse: 55 71  68  Temp: 98.3 F (36.8 C) 97.9 F (36.6 C)  97.9 F (36.6 C)  TempSrc: Oral Oral  Oral  Resp: Height:      Weight: 132.9 kg (292 lb 15.9 oz)     SpO2: 95% 98%  99%    Intake/Output Summary (Last 24 hours) at 01/11/15 1724 Last data filed at 01/11/15 1600  Gross per 24 hour  Intake   2467 ml  Output   2101 ml  Net    366 ml   Filed Weights   01/08/15 0533 01/09/15 0522 01/11/15 0554  Weight: 134.945 kg (297 lb 8 oz) 135.399 kg (298 lb 8 oz) 132.9 kg (292 lb 15.9 oz)    Exam:   General:  Sitting in chair, awake, in  nad  Cardiovascular: regular, s1, s2  Respiratory: normal resp effort, no wheezing, no crackles  Abdomen: soft,nondistended  Musculoskeletal: perfused,no clubbing   Data Reviewed: Basic Metabolic Panel:  Recent Labs Lab 01/07/15 0323 01/08/15 0844 01/09/15 0317 01/10/15 0550 01/11/15 0500  NA 132* 132* 131* 130* 128*  K 3.6 3.6 3.7 3.3* 3.5  CL 88* 85* 84* 81* 80*  CO2 35* 37* 35* 39* 33*  GLUCOSE 123* 186* 146* 111* 171*  BUN 22 26* 30* 34* 39*  CREATININE 1.55* 1.82* 1.77* 1.56* 1.52*  CALCIUM 9.2 9.0 8.9 9.0 9.3   Liver Function Tests: No results for input(s): AST, ALT, ALKPHOS, BILITOT, PROT, ALBUMIN in the last 168 hours. No results for input(s): LIPASE, AMYLASE in the last 168 hours. No results for input(s): AMMONIA in the last 168 hours. CBC:  Recent Labs Lab 01/04/15 1853 01/05/15 0425 01/09/15 0317  WBC 7.1 6.8 6.5  NEUTROABS 5.2 5.0  --   HGB 11.4* 11.0* 11.0*  HCT 35.4* 34.2* 34.8*  MCV 80.8 80.5 81.3  PLT 294 294 362   Cardiac Enzymes: No results for input(s): CKTOTAL, CKMB, CKMBINDEX, TROPONINI in the last 168 hours. BNP (last 3 results)  Recent Labs  01/04/15 1853  BNP 218.4*    ProBNP (last 3 results) No results for input(s): PROBNP in the last 8760 hours.  CBG:  Recent Labs Lab 01/09/15 2110 01/10/15  [id number] 01/10/15 1148 01/10/15 1609 01/10/15 2116  GLUCAP 168* 116* 183* 108* 170*    No results found for this or any previous visit (from the past 240 hour(s)).   Studies: No results found.  Scheduled Meds: . apixaban  5 mg Oral BID  . atorvastatin  80 mg Oral q1800  . carvedilol  12.5 mg Oral BID WC  . colchicine  0.6 mg Oral Daily  . docusate sodium  100 mg Oral BID  . metolazone  5 mg Oral BID  . potassium chloride  40 mEq Oral BID  . sodium chloride  3 mL Intravenous Q12H  . spironolactone  25 mg Oral Daily   Continuous Infusions: . furosemide (LASIX) infusion 15 mg/hr (01/10/15 2311)    Principal Problem:    Acute on chronic diastolic CHF (congestive heart failure) Active Problems:   Atrial fibrillation   Hypertension   Morbid obesity   Peripheral vascular disease   Chronic venous insufficiency   Congestive heart disease   Peripheral edema   Gennaro Lizotte K  [facility] Pager [phone]. If 7PM-7AM, please contact night-coverage at [email or web address], password [email or web address] 01/11/2015, 5:24 PM  LOS: 7 days

## 2015-01-11 NOTE — Progress Notes (Signed)
Patient ID: Jesus [name], male   DOB: 04/06/1946, 69 y.o.   MRN: [id number]   SUBJECTIVE: Still edematous but he feels like the swelling is going down.  Unable to measure UOP (urinating into towel) and still question accuracy of weights.  Creatinine stable, he says he is urinating a lot.  Weight down 6 lbs today.   Scheduled Meds: . apixaban  5 mg Oral BID  . atorvastatin  80 mg Oral q1800  . carvedilol  12.5 mg Oral BID WC  . colchicine  0.6 mg Oral Daily  . docusate sodium  100 mg Oral BID  . metolazone  5 mg Oral BID  . potassium chloride  20 mEq Oral Once  . potassium chloride  40 mEq Oral BID  . sodium chloride  3 mL Intravenous Q12H  . spironolactone  25 mg Oral Daily   Continuous Infusions: . furosemide (LASIX) infusion 15 mg/hr (01/10/15 2311)   PRN Meds:.acetaminophen **OR** acetaminophen, ondansetron **OR** ondansetron (ZOFRAN) IV    Filed Vitals:   01/10/15 1100 01/10/15 1430 01/10/15 2100 01/11/15 0554  BP: 101/74 122/69 113/56 99/47  Pulse: 68 71 80 55  Temp:  97.8 F (36.6 C) 97.9 F (36.6 C) 98.3 F (36.8 C)  TempSrc:  Oral Oral Oral  Resp:  20 20 19   Height:      Weight:    292 lb 15.9 oz (132.9 kg)  SpO2:  97% 94% 95%    Intake/Output Summary (Last 24 hours) at 01/11/15 0825 Last data filed at 01/11/15 0700  Gross per 24 hour  Intake   2097 ml  Output   1150 ml  Net    947 ml    LABS: Basic Metabolic Panel:  Recent Labs  40/98/1101/02/20 0550 01/11/15 0500  NA 130* 128*  K 3.3* 3.5  CL 81* 80*  CO2 39* 33*  GLUCOSE 111* 171*  BUN 34* 39*  CREATININE 1.56* 1.52*  CALCIUM 9.0 9.3   Liver Function Tests: No results for input(s): AST, ALT, ALKPHOS, BILITOT, PROT, ALBUMIN in the last 72 hours. No results for input(s): LIPASE, AMYLASE in the last 72 hours. CBC:  Recent Labs  01/09/15 0317  WBC 6.5  HGB 11.0*  HCT 34.8*  MCV 81.3  PLT 362   Cardiac Enzymes: No results for input(s): CKTOTAL, CKMB, CKMBINDEX, TROPONINI in the last 72  hours. BNP: Invalid input(s): POCBNP D-Dimer: No results for input(s): DDIMER in the last 72 hours. Hemoglobin A1C: No results for input(s): HGBA1C in the last 72 hours. Fasting Lipid Panel: No results for input(s): CHOL, HDL, LDLCALC, TRIG, CHOLHDL, LDLDIRECT in the last 72 hours. Thyroid Function Tests: No results for input(s): TSH, T4TOTAL, T3FREE, THYROIDAB in the last 72 hours.  Invalid input(s): FREET3 Anemia Panel: No results for input(s): VITAMINB12, FOLATE, FERRITIN, TIBC, IRON, RETICCTPCT in the last 72 hours.  RADIOLOGY: Dg Chest 2 View  01/04/2015   CLINICAL DATA:  Chronic shortness of breath. Lower extremity swelling. Congestive heart failure.  EXAM: CHEST  2 VIEW  COMPARISON:  None.  FINDINGS: Mild to moderate cardiomegaly noted as well as ectasia of the thoracic aorta. Pulmonary vascular congestion is seen, however there is no evidence of frank pulmonary edema or focal infiltrate. No evidence of pleural effusion. Old bilateral rib fracture deformities incidentally noted.  IMPRESSION: Cardiomegaly and pulmonary vascular congestion.  No acute findings.   Electronically Signed   By: Myles RosenthalJohn  Stahl M.D.   On: 01/04/2015 19:34    PHYSICAL EXAM General: NAD Neck:  JVP difficult, 10 cm, no thyromegaly or thyroid nodule.  Lungs: Clear to auscultation bilaterally with normal respiratory effort. CV: Nondisplaced PMI.  Heart irregular S1/S2, no S3/S4, no murmur. 1+ edema to thighs, scrotal edema.   Abdomen: Obese, soft, nontender, no hepatosplenomegaly, no distention.  Neurologic: Alert and oriented x 3.  Psych: Normal affect. Extremities: No clubbing or cyanosis.   TELEMETRY: Reviewed telemetry pt in atrial fibrillation HR 60s  ASSESSMENT AND PLAN: 69 yo with history of chronic atrial fibrillation, diastolic CHF, HTN presented with acute on chronic diastolic CHF.  1. Atrial fibrillation: Chronic x years.  Good rate control with Coreg.  He agrees to start Eliquis (had refused  anticoagulation in the past).  2. Acute on chronic diastolic CHF: EF remains normal on echo, RV poorly visualized but dilated.  JVP difficult to assess but appears still elevated.  Leg and scrotal edema improved but still present.  Creatinine stable today.  Very difficult to assess progress as he is incontinent of urine and refuses a foley and I am uncertain of the accuracy of his weights => he and I both think that his edema is improving.  Probably still has volume overload (not just venous insufficiency).  We discussed RHC again and he still does not want.  - Continue Lasix gtt and metolazone today, follow creatinine carefully.  - Will reassess tomorrow, maybe to po meds and home tomorrow.   3. AKI: Creatinine above baseline but lower today (1.8 => 1.77 => 1.56 => 1.52).  Watch closely with further diuresis.  4. Hyponatremia: Fluid restrict to 1800 cc.   [name] 01/11/2015 8:25 AM

## 2015-01-12 LAB — CBC
HCT: 36.9 % — ABNORMAL LOW (ref 39.0–52.0)
Hemoglobin: 12 g/dL — ABNORMAL LOW (ref 13.0–17.0)
MCH: 25.8 pg — ABNORMAL LOW (ref 26.0–34.0)
MCHC: 32.5 g/dL (ref 30.0–36.0)
MCV: 79.2 fL (ref 78.0–100.0)
Platelets: 369 10*3/uL (ref 150–400)
RBC: 4.66 MIL/uL (ref 4.22–5.81)
RDW: 15 % (ref 11.5–15.5)
WBC: 6.9 10*3/uL (ref 4.0–10.5)

## 2015-01-12 LAB — BASIC METABOLIC PANEL
Anion gap: 8 (ref 5–15)
BUN: 40 mg/dL — AB (ref 6–23)
CHLORIDE: 81 mmol/L — AB (ref 96–112)
CO2: 41 mmol/L — AB (ref 19–32)
Calcium: 9.8 mg/dL (ref 8.4–10.5)
Creatinine, Ser: 1.54 mg/dL — ABNORMAL HIGH (ref 0.50–1.35)
GFR calc non Af Amer: 45 mL/min — ABNORMAL LOW (ref >90)
GFR, EST AFRICAN AMERICAN: 52 mL/min — AB (ref >90)
Glucose, Bld: 122 mg/dL — ABNORMAL HIGH (ref 70–99)
Potassium: 3.6 mmol/L (ref 3.5–5.1)
SODIUM: 130 mmol/L — AB (ref 135–145)

## 2015-01-12 MED ORDER — APIXABAN 5 MG PO TABS: 5.0000 mg | ORAL_TABLET | Freq: Two times a day (BID) | ORAL | Status: DC

## 2015-01-12 MED ORDER — TORSEMIDE 20 MG PO TABS
80.0000 mg | ORAL_TABLET | Freq: Every day | ORAL | Status: DC
  Administered 2015-01-12: 80 mg via ORAL
  Filled 2015-01-12 (×2): qty 4

## 2015-01-12 MED ORDER — TORSEMIDE 20 MG PO TABS: 40.0000 mg | ORAL_TABLET | Freq: Every evening | ORAL | Status: DC

## 2015-01-12 MED ORDER — TORSEMIDE 20 MG PO TABS: 80.0000 mg | ORAL_TABLET | Freq: Every day | ORAL | Status: DC

## 2015-01-12 MED ORDER — TORSEMIDE 20 MG PO TABS
40.0000 mg | ORAL_TABLET | Freq: Every evening | ORAL | Status: DC
  Filled 2015-01-12: qty 2

## 2015-01-12 MED ORDER — CARVEDILOL 12.5 MG PO TABS: 12.5000 mg | ORAL_TABLET | Freq: Two times a day (BID) | ORAL | Status: DC

## 2015-01-12 MED ORDER — SPIRONOLACTONE 25 MG PO TABS: 25.0000 mg | ORAL_TABLET | Freq: Every day | ORAL | Status: DC

## 2015-01-12 MED ORDER — POTASSIUM CHLORIDE CRYS ER 20 MEQ PO TBCR: 40.0000 meq | EXTENDED_RELEASE_TABLET | Freq: Two times a day (BID) | ORAL | Status: DC

## 2015-01-12 NOTE — Progress Notes (Signed)
CRITICAL VALUE ALERT  Critical value received:  CO2 41  Date of notification:  01/12/2015  Time of notification:  05:47  Critical value read back:Yes.    Nurse who received alert:  Doroteo GlassmanEmily Shatana Saxton, RN  MD notified (1st page):  Dr. Onalee HuaAlvarez  Time of first page:  05:52

## 2015-01-12 NOTE — Discharge Summary (Signed)
Physician Discharge Summary  Jesus [name] ZOX:[id number] DOB: February 04, 1946 DOA: 01/04/2015  PCP: Miki Kins  Admit date: 01/04/2015 Discharge date: 01/12/2015  Time spent: 25 minutes  Recommendations for Outpatient Follow-up:  1. Follow up with PCP in 1-2 weeks 2. Follow up with CHF clinic in 1 week 3. Follow up with Cardiology as scheduled  Discharge Diagnoses:  Principal Problem:   Acute on chronic diastolic CHF (congestive heart failure) Active Problems:   Atrial fibrillation   Hypertension   Morbid obesity   Peripheral vascular disease   Chronic venous insufficiency   Congestive heart disease   Peripheral edema   Pulmonary vascular congestion   Discharge Condition: Improved  Diet recommendation: Heart healthy  Filed Weights   01/09/15 0522 01/11/15 0554 01/12/15 0559  Weight: 135.399 kg (298 lb 8 oz) 132.9 kg (292 lb 15.9 oz) 129.91 kg (286 lb 6.4 oz)    History of present illness:  Please see admit h and p from 1/28 for details. Briefly, pt presents with worsening swelling and volume overload in the setting of known diastolic CHF. The patient was admitted for further work up.  Hospital Course:  1. Acute on chronic diastolic CHF 1. Cardiology was consulted and had been following 2. Patient was initially continued on lasix gtt per Cardiology 3. Difficult to assess volume status as pt had refused foley cath 4. Overall, pt gradually improved 5. By 2/5, the patient was stable for discharge with close outpatient follow up. 2. Afib 1. Remained rate controlled 2. Started eliquis based on CHADS score during this admit. Pt had previously refused anticoagulation 3. HTN with hypertensive cardiomyopathy 1. Overall stable, intermittently low bp 2. Cont current regimen 4. Morbid obesity 1. Stable 5. Peripheral vascular disease 1. On ASA 6. Chronic venous insufficiency 1. WOC was consulted 1/29. Appreciate recs 7. ARF 1. Cr slowly improving with diuresis 2. Cont  as planned and follow renal function 8. DVT prophylaxis 1. Heparin subQ 9. Hypokalemia 1. Replaced this AM  Consultations:  Cardiology  Discharge Exam: Filed Vitals:   01/11/15 1332 01/11/15 2017 01/12/15 0559 01/12/15 0937  BP: 107/53 110/56 106/69 103/88  Pulse: 68 69 74 71  Temp: 97.9 F (36.6 C) 97.9 F (36.6 C) 97.8 F (36.6 C)   TempSrc: Oral Oral Oral   Resp: Height:      Weight:   129.91 kg (286 lb 6.4 oz)   SpO2: 99% 98% 100%     General: Awake, in nad Cardiovascular: regular, s1, s2 Respiratory: normal resp effort, no wheezing  Discharge Instructions     Medication List    STOP taking these medications        lisinopril 40 MG tablet  Commonly known as:  PRINIVIL,ZESTRIL     metolazone 2.5 MG tablet  Commonly known as:  ZAROXOLYN      TAKE these medications        apixaban 5 MG Tabs tablet  Commonly known as:  ELIQUIS  Take 1 tablet (5 mg total) by mouth 2 (two) times daily.     atorvastatin 80 MG tablet  Commonly known as:  LIPITOR  Take 80 mg by mouth daily.     carvedilol 12.5 MG tablet  Commonly known as:  COREG  Take 1 tablet (12.5 mg total) by mouth 2 (two) times daily with a meal.     colchicine 0.6 MG tablet  Take 1 tablet (0.6 mg total) by mouth daily.     pantoprazole 40 MG  tablet  Commonly known as:  PROTONIX  Take 40 mg by mouth daily.     potassium chloride SA 20 MEQ tablet  Commonly known as:  K-DUR,KLOR-CON  2 tablets (total 40 mEq) on the day you take metolazone (zaroxlyn)     potassium chloride SA 20 MEQ tablet  Commonly known as:  K-DUR,KLOR-CON  Take 2 tablets (40 mEq total) by mouth 2 (two) times daily.     spironolactone 25 MG tablet  Commonly known as:  ALDACTONE  Take 1 tablet (25 mg total) by mouth daily.     spironolactone 25 MG tablet  Commonly known as:  ALDACTONE  Take 1 tablet (25 mg total) by mouth daily.     torsemide 20 MG tablet  Commonly known as:  DEMADEX  Take 2 tablets (40 mg  total) by mouth every evening.     torsemide 20 MG tablet  Commonly known as:  DEMADEX  Take 4 tablets (80 mg total) by mouth daily after breakfast.       Allergies  Allergen Reactions  . Amlodipine Besylate Swelling    "started off low and ended up severe; if, in fact, that is what's causing the swelling" (06/03/12)   Follow-up Information    Follow up with Marca Anconaalton McLean, MD On 01/17/2015.   Specialty:  Cardiology   Why:  at 1:40pm in the [facility]--Gate code [id number]--please bring medications;   Contact information:   [address] [place] [address] [phone]       Follow up with [name],[name], [name]. Schedule an appointment as soon as possible for a visit in 1 week.   Specialty:  Physician Assistant   Contact information:   [address], [address] [place] [address]        The results of significant diagnostics from this hospitalization (including imaging, microbiology, ancillary and laboratory) are listed below for reference.    Significant Diagnostic Studies: Dg Chest 2 View  01/04/2015   CLINICAL DATA:  Chronic shortness of breath. Lower extremity swelling. Congestive heart failure.  EXAM: CHEST  2 VIEW  COMPARISON:  None.  FINDINGS: Mild to moderate cardiomegaly noted as well as ectasia of the thoracic aorta. Pulmonary vascular congestion is seen, however there is no evidence of frank pulmonary edema or focal infiltrate. No evidence of pleural effusion. Old bilateral rib fracture deformities incidentally noted.  IMPRESSION: Cardiomegaly and pulmonary vascular congestion.  No acute findings.   Electronically Signed   By: Myles RosenthalJohn  Stahl M.D.   On: 01/04/2015 19:34    Microbiology: No results found for this or any previous visit (from the past 240 hour(s)).   Labs: Basic Metabolic Panel:  Recent Labs Lab 01/08/15 0844 01/09/15 0317 01/10/15 0550 01/11/15 0500 01/12/15 0438  NA 132* 131* 130* 128* 130*  K 3.6 3.7 3.3* 3.5 3.6   CL 85* 84* 81* 80* 81*  CO2 37* 35* 39* 33* 41*  GLUCOSE 186* 146* 111* 171* 122*  BUN 26* 30* 34* 39* 40*  CREATININE 1.82* 1.77* 1.56* 1.52* 1.54*  CALCIUM 9.0 8.9 9.0 9.3 9.8   Liver Function Tests: No results for input(s): AST, ALT, ALKPHOS, BILITOT, PROT, ALBUMIN in the last 168 hours. No results for input(s): LIPASE, AMYLASE in the last 168 hours. No results for input(s): AMMONIA in the last 168 hours. CBC:  Recent Labs Lab 01/09/15 0317 01/12/15 0438  WBC 6.5 6.9  HGB 11.0* 12.0*  HCT 34.8* 36.9*  MCV 81.3 79.2  PLT 362 369   Cardiac Enzymes: No results for input(s): CKTOTAL, CKMB, CKMBINDEX, TROPONINI in the last 168 hours. BNP: BNP (last 3 results)  Recent Labs  01/04/15 1853  BNP 218.4*    ProBNP (last 3 results) No results for input(s): PROBNP in the last 8760 hours.  CBG:  Recent Labs Lab 01/09/15 2110 01/10/15 0619 01/10/15 1148 01/10/15 1609 01/10/15 2116  GLUCAP 168* 116* 183* 108* 170*   Signed:  Sahiba Granholm K  [facility] 01/12/2015, 10:48 AM

## 2015-01-12 NOTE — Progress Notes (Signed)
Patient ID: Winfield CunasDouglas Mast, male   DOB: 08-17-1946, 69 y.o.   MRN: [id number]   SUBJECTIVE: Weight down another 6 lbs.  Leg and scrotal edema are down.  No dyspnea.  Scheduled Meds: . apixaban  5 mg Oral BID  . atorvastatin  80 mg Oral q1800  . carvedilol  12.5 mg Oral BID WC  . colchicine  0.6 mg Oral Daily  . docusate sodium  100 mg Oral BID  . potassium chloride  40 mEq Oral BID  . sodium chloride  3 mL Intravenous Q12H  . spironolactone  25 mg Oral Daily  . torsemide  40 mg Oral QPM  . torsemide  80 mg Oral QPC breakfast   Continuous Infusions:   PRN Meds:.acetaminophen **OR** acetaminophen, ondansetron **OR** ondansetron (ZOFRAN) IV    Filed Vitals:   01/11/15 1000 01/11/15 1332 01/11/15 2017 01/12/15 0559  BP: 116/90 107/53 110/56 106/69  Pulse:  68 69 74  Temp:  97.9 F (36.6 C) 97.9 F (36.6 C) 97.8 F (36.6 C)  TempSrc:  Oral Oral Oral  Resp:  18 16 18   Height:      Weight:    286 lb 6.4 oz (129.91 kg)  SpO2:  99% 98% 100%    Intake/Output Summary (Last 24 hours) at 01/12/15 0749 Last data filed at 01/12/15 0705  Gross per 24 hour  Intake   3655 ml  Output   3277 ml  Net    378 ml    LABS: Basic Metabolic Panel:  Recent Labs  40/98/1101/03/23 0500 01/12/15 0438  NA 128* 130*  K 3.5 3.6  CL 80* 81*  CO2 33* 41*  GLUCOSE 171* 122*  BUN 39* 40*  CREATININE 1.52* 1.54*  CALCIUM 9.3 9.8   Liver Function Tests: No results for input(s): AST, ALT, ALKPHOS, BILITOT, PROT, ALBUMIN in the last 72 hours. No results for input(s): LIPASE, AMYLASE in the last 72 hours. CBC:  Recent Labs  01/12/15 0438  WBC 6.9  HGB 12.0*  HCT 36.9*  MCV 79.2  PLT 369   Cardiac Enzymes: No results for input(s): CKTOTAL, CKMB, CKMBINDEX, TROPONINI in the last 72 hours. BNP: Invalid input(s): POCBNP D-Dimer: No results for input(s): DDIMER in the last 72 hours. Hemoglobin A1C: No results for input(s): HGBA1C in the last 72 hours. Fasting Lipid Panel: No results for  input(s): CHOL, HDL, LDLCALC, TRIG, CHOLHDL, LDLDIRECT in the last 72 hours. Thyroid Function Tests: No results for input(s): TSH, T4TOTAL, T3FREE, THYROIDAB in the last 72 hours.  Invalid input(s): FREET3 Anemia Panel: No results for input(s): VITAMINB12, FOLATE, FERRITIN, TIBC, IRON, RETICCTPCT in the last 72 hours.  RADIOLOGY: Dg Chest 2 View  01/04/2015   CLINICAL DATA:  Chronic shortness of breath. Lower extremity swelling. Congestive heart failure.  EXAM: CHEST  2 VIEW  COMPARISON:  None.  FINDINGS: Mild to moderate cardiomegaly noted as well as ectasia of the thoracic aorta. Pulmonary vascular congestion is seen, however there is no evidence of frank pulmonary edema or focal infiltrate. No evidence of pleural effusion. Old bilateral rib fracture deformities incidentally noted.  IMPRESSION: Cardiomegaly and pulmonary vascular congestion.  No acute findings.   Electronically Signed   By: Myles RosenthalJohn  Stahl M.D.   On: 01/04/2015 19:34    PHYSICAL EXAM General: NAD Neck: JVP difficult, ?8 cm, no thyromegaly or thyroid nodule.  Lungs: Clear to auscultation bilaterally with normal respiratory effort. CV: Nondisplaced PMI.  Heart irregular S1/S2, no S3/S4, no murmur. 1+ ankle edema, scrotal edema  mostly resolved.   Abdomen: Obese, soft, nontender, no hepatosplenomegaly, no distention.  Neurologic: Alert and oriented x 3.  Psych: Normal affect. Extremities: No clubbing or cyanosis.   TELEMETRY: Reviewed telemetry pt in atrial fibrillation HR 60s  ASSESSMENT AND PLAN: 69 yo with history of chronic atrial fibrillation, diastolic CHF, HTN presented with acute on chronic diastolic CHF.  1. Atrial fibrillation: Chronic x years.  Good rate control with Coreg.  He has agreed to start Eliquis (had refused anticoagulation in the past).  2. Acute on chronic diastolic CHF: EF remains normal on echo, RV poorly visualized but dilated.  JVP difficult to assess but appears improved.  Leg and scrotal edema  improved.  Creatinine stable today.  He has refused RHC.  I think that he is improved, I will transition his diuretics to torsemide 80 qam/40 qpm.   3. AKI on CKD: Creatinine above baseline but has been stable (1.8 => 1.77 => 1.56 => 1.52 => 1.57).   4. Hyponatremia: He drinks too much fluid, should follow 2 L fluid restriction at home.  5. Disposition: I think he can go home with home health today.  He will need followup in CHF clinic next week. He should follow 2 L fluid restriction.  Meds for home: Coreg 12.5 mg bid, Eliquis 5 mg bid, spironolactone 25 mg daily, torsemide 80 mg qam/40 mg qpm, KCl 20 bid.  Stop lisinopril.   [name] 01/12/2015 7:49 AM

## 2015-01-12 NOTE — Progress Notes (Signed)
I stopped back in to review education sheet regarding fluid restriction with [name].  I gave him information about common cup sizes and how much fluid is in each as well as common foods/drinks and fluid content in those items.  He expressed appreciation and says that he plans to follow fluid restriction at home..  I reviewed his follow-up appt date and time in the AHF clinic.  I also gave him a map to assist with finding the AHF clinic.

## 2015-01-15 ENCOUNTER — Telehealth (HOSPITAL_COMMUNITY): Payer: Self-pay | Admitting: Surgery

## 2015-01-15 NOTE — Telephone Encounter (Signed)
[facility] Follow-up Telephone Call  I called Jesus [name] to check on him after his recent discharge from the hospital.  He says that he has been weighing daily and that weight today was 286 lbs -(his discharge weight on 01/12/15 was 286.4lbs).  He also tells me that he has been following 2 L fluid restriction as well as low sodium diet.  He was able to get all medications and claims to be taking as directed.   I attempted to verify his follow- up appt--he seemed confused regarding the follow-up appt scheduled in the AHF clinic--asking me several times where is was and when it was.  I was able to reinforce his appt date, time, location and directions to get here.  Of note-- I did deliver him a map and reviewed directions while he was inpatient.  He acknowledges that he will be at the upcoming appt and I have given him the phone number to the AHF clinic if needed for further directions.

## 2015-01-17 ENCOUNTER — Encounter (HOSPITAL_COMMUNITY): Payer: Self-pay

## 2015-01-17 ENCOUNTER — Ambulatory Visit (HOSPITAL_COMMUNITY)
Admit: 2015-01-17 | Discharge: 2015-01-17 | Disposition: A | Discharge status: Home / Self Care | Payer: Commercial Managed Care - HMO | Source: Ambulatory Visit | Attending: Cardiology | Admitting: Cardiology

## 2015-01-17 VITALS — BP 109/65 | HR 76 | Resp 20 | Wt 293.2 lb

## 2015-01-17 DIAGNOSIS — I10 Essential (primary) hypertension: Secondary | ICD-10-CM | POA: Diagnosis not present

## 2015-01-17 DIAGNOSIS — I482 Chronic atrial fibrillation: Secondary | ICD-10-CM | POA: Insufficient documentation

## 2015-01-17 DIAGNOSIS — I5033 Acute on chronic diastolic (congestive) heart failure: Secondary | ICD-10-CM

## 2015-01-17 DIAGNOSIS — L03119 Cellulitis of unspecified part of limb: Secondary | ICD-10-CM

## 2015-01-17 DIAGNOSIS — I5022 Chronic systolic (congestive) heart failure: Secondary | ICD-10-CM

## 2015-01-17 DIAGNOSIS — I5032 Chronic diastolic (congestive) heart failure: Secondary | ICD-10-CM | POA: Insufficient documentation

## 2015-01-17 DIAGNOSIS — L03116 Cellulitis of left lower limb: Secondary | ICD-10-CM | POA: Diagnosis not present

## 2015-01-17 DIAGNOSIS — L03115 Cellulitis of right lower limb: Secondary | ICD-10-CM | POA: Insufficient documentation

## 2015-01-17 MED ORDER — DOXYCYCLINE HYCLATE 100 MG PO TABS: 100.0000 mg | ORAL_TABLET | Freq: Two times a day (BID) | ORAL | Status: DC

## 2015-01-17 MED ORDER — TORSEMIDE 20 MG PO TABS: 40.0000 mg | ORAL_TABLET | Freq: Two times a day (BID) | ORAL | Status: DC

## 2015-01-17 NOTE — Patient Instructions (Addendum)
Increase Torsemide to 40 mg (2 tabs) Twice daily  Take Metolazone 2.5 mg today and tomorrow  Take an extra 2 potassium tabs today and tomorrow with Metolazone  Start Doxycycline 100 mg Twice daily   [facility], they will contact you to schedule a time to come out  Your physician recommends that you schedule a follow-up appointment in: 1 week

## 2015-01-17 NOTE — Progress Notes (Signed)
Patient ID: Jesus [name], male   DOB: 09/08/1946, 69 y.o.   MRN: [id number] PCP: Dr. Jeannetta Nap  69 yo with history of obesity, chronic atrial fibrillation, resistant HTN, and diastolic CHF.  He returns for post hospital follow up. Just discharged last week after being diuresed. Discharge weight was 286 pounds. Overall feeling bad. Mild dyspnea with exertion. Ongoing lower extremity edema. R and LLE erythema noted with exudate noted. Limited mobility. Taking all medications.   Labs (8/12): K 4.6, creatinine 0.62, BNP 227, HCT 39.9 Labs (5/13): LDL 116, HDL 30, proBNP 388 Labs (6/13): K 3.1, creatinine 0.7, proBNP 282, immunofixation with no monoclonal protein.  Labs (7/13): K 4.4, creatinine 1.24, BNP 319 Labs (10/13): K 4.7, creatinine 1.3, LDL 138 Labs 01/12/2015: k 3.6 Creatinine 1.54  PMH: 1. Obesity 2. Atrial fibrillation: First noted in 8/12, persistent.  3. Diastolic CHF: Echo (5/13) with severe LVH, biatrial enlargement, EF 50-55%.  4. HTN: since late teens.  5. Chronic lower extremity edema/venous insufficiency.  6. OA knees: severe.  7. Gout  FH: Father with atrial fibrillation.   SH: Divorced.  Lives in [place].  Used to be an appliance repairman but now on disability.  Nonsmoker.    ROS: All systems reviewed and negative except as per HPI.   Current Outpatient Prescriptions  Medication Sig Dispense Refill  . apixaban (ELIQUIS) 5 MG TABS tablet Take 1 tablet (5 mg total) by mouth 2 (two) times daily. 60 tablet 0  . atorvastatin (LIPITOR) 80 MG tablet Take 80 mg by mouth daily.    . carvedilol (COREG) 12.5 MG tablet Take 1 tablet (12.5 mg total) by mouth 2 (two) times daily with a meal. 60 tablet 0  . colchicine 0.6 MG tablet Take 1 tablet (0.6 mg total) by mouth daily. (Patient not taking: Reported on 01/05/2015) 30 tablet 0  . pantoprazole (PROTONIX) 40 MG tablet Take 40 mg by mouth daily.    . potassium chloride SA (K-DUR,KLOR-CON) 20 MEQ tablet 2 tablets (total 40 mEq)  on the day you take metolazone (zaroxlyn) 16 tablet 1  . potassium chloride SA (K-DUR,KLOR-CON) 20 MEQ tablet Take 2 tablets (40 mEq total) by mouth 2 (two) times daily. 60 tablet 0  . spironolactone (ALDACTONE) 25 MG tablet Take 1 tablet (25 mg total) by mouth daily. (Patient not taking: Reported on 01/05/2015) 90 tablet 3  . spironolactone (ALDACTONE) 25 MG tablet Take 1 tablet (25 mg total) by mouth daily. 30 tablet 0  . torsemide (DEMADEX) 20 MG tablet Take 2 tablets (40 mg total) by mouth every evening. 30 tablet 0  . torsemide (DEMADEX) 20 MG tablet Take 4 tablets (80 mg total) by mouth daily after breakfast. 30 tablet 0   No current facility-administered medications for this encounter.                                                                BP 109/65 mmHg  Pulse 76  Resp 20  Wt 293 lb 4 oz (133.017 kg)  SpO2 98% General: NAD, obese.Arrived in a wheel chair.   Neck: Thick, JVP to jaw  no thyromegaly or thyroid nodule.  Lungs: Clear to auscultation bilaterally with normal respiratory effort. CV: Nondisplaced PMI.  Heart irregular S1/S2, no S3/S4, 1/6 SEM RUSB. Jesus [name]  LLE 2+ edema to knees bilaterally.  No carotid bruit.  Unable to palpate pedal pulses due to edema. .  Abdomen: Soft, nontender, no hepatosplenomegaly, mild distention. Skin: Right lower leg is erythematous and has probable venous stasis ulcerations.   Neurologic: Alert and oriented x 3.  Psych: Normal affect. Extremities: No clubbing or cyanosis.  R and LLE 2+ edema with partial thickness wounds noted and moderate exudate. Redness noted  Assessment/Plan: 1) Chronic Diastolic CHF  Chronic diastolic CHF in setting of long-standing HTN and atrial fibrillation. Suspect hypertensive cardiomyopathy based on  echo.  NYHA IIIb.  Volume status elevated. Weight up 7 pounds from discharge. Increase torsemide to 40 mg twice a day and give 2.5 mg metolazone for 2 days. Take extra potassium today.  Reinforced daily  weights, low salt diet, and limiting fluid intake to , 2 liters per day.   2) Atrial fibrillation- Chronic  ChadsVasc Score= 3.  Rate is controlled on current BB dose. On eliquis 5 mg twice a day. No bleeding problems.  3) Hypertension  Hypertensive cardiomyopathy with severe LVH.  BP seems reasonably controlled, typically SBP < 150.  4) Cellulitis  R and LLE . Add doxycycline 100 mg twice a day. Wrap legs with unna boot. Change 3 days a week. [facility]  Follow up next week to reassess volume. Check BMET BNP next visit. Marland Kitchen.   [name],AMY NP-C  06/09/2013 1:52 PM   Patient seen and examined with Jesus BecketAmy Clegg, NP. We discussed all aspects of the encounter. I agree with the assessment and plan as stated above.   Volume status is up. Agree with increasing demadex and giving 2 doses of metolazone with extra potassium. Will treat with doxy and leg wraps for probable LE cellulitis. Continue Eliquis for AF.  Jesus Haywardaniel Elfreida Heggs,MD 7:41 PM

## 2015-01-19 ENCOUNTER — Telehealth (HOSPITAL_COMMUNITY): Payer: Self-pay

## 2015-01-19 ENCOUNTER — Other Ambulatory Visit (HOSPITAL_COMMUNITY): Payer: Self-pay

## 2015-01-19 DIAGNOSIS — I4891 Unspecified atrial fibrillation: Secondary | ICD-10-CM | POA: Diagnosis not present

## 2015-01-19 DIAGNOSIS — L03115 Cellulitis of right lower limb: Secondary | ICD-10-CM | POA: Diagnosis not present

## 2015-01-19 DIAGNOSIS — I871 Compression of vein: Secondary | ICD-10-CM | POA: Diagnosis not present

## 2015-01-19 DIAGNOSIS — I739 Peripheral vascular disease, unspecified: Secondary | ICD-10-CM | POA: Diagnosis not present

## 2015-01-19 DIAGNOSIS — I872 Venous insufficiency (chronic) (peripheral): Secondary | ICD-10-CM | POA: Diagnosis not present

## 2015-01-19 DIAGNOSIS — I5033 Acute on chronic diastolic (congestive) heart failure: Secondary | ICD-10-CM | POA: Diagnosis not present

## 2015-01-19 MED ORDER — DOXYCYCLINE HYCLATE 100 MG PO TABS: 100.0000 mg | ORAL_TABLET | Freq: Two times a day (BID) | ORAL | Status: DC

## 2015-01-19 NOTE — Telephone Encounter (Signed)
[facility]urse with [facility] called for initial post hospital follow up on patient (discharged 01/12/15).  States he has been very noncompliant with medications, had not taken anything in 2 days, and was drunk.  Daughter was rpesent during visit and states he is drunk almost every day, and that she cannot be there everyday but will try her best to stop by after work (5 minutes away) to ensure he is taking medications appropriately.  Nurse did get a weight of 292lb 2 oz, and applied bilateral una boots.  Unable to set up telemonitoring service due to noncompliance and lack of support. [facility] our office shortly after and asked for another Rx to be sent in for doxycycline as the patient/daughter could not find it.  Ave FilterBradley, Ceanna Wareing Genevea

## 2015-01-22 DIAGNOSIS — I739 Peripheral vascular disease, unspecified: Secondary | ICD-10-CM | POA: Diagnosis not present

## 2015-01-22 DIAGNOSIS — L03115 Cellulitis of right lower limb: Secondary | ICD-10-CM | POA: Diagnosis not present

## 2015-01-22 DIAGNOSIS — I5033 Acute on chronic diastolic (congestive) heart failure: Secondary | ICD-10-CM | POA: Diagnosis not present

## 2015-01-22 DIAGNOSIS — I871 Compression of vein: Secondary | ICD-10-CM | POA: Diagnosis not present

## 2015-01-22 DIAGNOSIS — I872 Venous insufficiency (chronic) (peripheral): Secondary | ICD-10-CM | POA: Diagnosis not present

## 2015-01-22 DIAGNOSIS — I4891 Unspecified atrial fibrillation: Secondary | ICD-10-CM | POA: Diagnosis not present

## 2015-01-24 ENCOUNTER — Ambulatory Visit (HOSPITAL_COMMUNITY)
Admission: RE | Admit: 2015-01-24 | Discharge: 2015-01-24 | Disposition: A | Discharge status: Home / Self Care | Payer: Commercial Managed Care - HMO | Source: Ambulatory Visit | Attending: Adult Health | Admitting: Adult Health

## 2015-01-24 ENCOUNTER — Encounter (HOSPITAL_COMMUNITY): Payer: Self-pay

## 2015-01-24 VITALS — BP 140/82 | HR 70 | Resp 22 | Wt 293.5 lb

## 2015-01-24 DIAGNOSIS — I5032 Chronic diastolic (congestive) heart failure: Secondary | ICD-10-CM

## 2015-01-24 DIAGNOSIS — I509 Heart failure, unspecified: Secondary | ICD-10-CM | POA: Insufficient documentation

## 2015-01-24 DIAGNOSIS — I4891 Unspecified atrial fibrillation: Secondary | ICD-10-CM | POA: Diagnosis not present

## 2015-01-24 DIAGNOSIS — I159 Secondary hypertension, unspecified: Secondary | ICD-10-CM

## 2015-01-24 DIAGNOSIS — I872 Venous insufficiency (chronic) (peripheral): Secondary | ICD-10-CM | POA: Diagnosis not present

## 2015-01-24 DIAGNOSIS — I739 Peripheral vascular disease, unspecified: Secondary | ICD-10-CM | POA: Diagnosis not present

## 2015-01-24 DIAGNOSIS — I482 Chronic atrial fibrillation, unspecified: Secondary | ICD-10-CM

## 2015-01-24 DIAGNOSIS — I871 Compression of vein: Secondary | ICD-10-CM | POA: Diagnosis not present

## 2015-01-24 DIAGNOSIS — L03115 Cellulitis of right lower limb: Secondary | ICD-10-CM | POA: Diagnosis not present

## 2015-01-24 DIAGNOSIS — I5033 Acute on chronic diastolic (congestive) heart failure: Secondary | ICD-10-CM | POA: Diagnosis not present

## 2015-01-24 MED ORDER — TORSEMIDE 20 MG PO TABS: 40.0000 mg | ORAL_TABLET | Freq: Two times a day (BID) | ORAL | Status: DC

## 2015-01-24 NOTE — Progress Notes (Signed)
Patient ID: Jesus [name], male   DOB: [birth year], 69 y.o.   MRN: [id number] PCP: Gerre PebblesSally [name] [name] / Dr Sedalia Mutaox   69 yo with history of obesity, chronic atrial fibrillation, resistant HTN, and diastolic CHF.  He returns for follow up. Last visit torsemide was increased and doxycycline was added due to cellulitis. Overall feeling a little better. Weight at home 289-294 pounds. Legs with less edema and erythema. Eating high salt foods. Uses motorized scooter. R and LLE erythema noted with exudate noted. Limited mobility. Taking all medications. Daughter helps him with his medications. [facility]   Labs (8/12): K 4.6, creatinine 0.62, BNP 227, HCT 39.9 Labs (5/13): LDL 116, HDL 30, proBNP 388 Labs (6/13): K 3.1, creatinine 0.7, proBNP 282, immunofixation with no monoclonal protein.  Labs (7/13): K 4.4, creatinine 1.24, BNP 319 Labs (10/13): K 4.7, creatinine 1.3, LDL 138 Labs (01/12/2015): k 3.6 Creatinine 1.54  PMH: 1. Obesity 2. Atrial fibrillation: First noted in 8/12, persistent.  3. Diastolic CHF: Echo (5/13) with severe LVH, biatrial enlargement, EF 50-55%.  4. HTN: since late teens.  5. Chronic lower extremity edema/venous insufficiency.  6. OA knees: severe.  7. Gout  FH: Father with atrial fibrillation.   SH: Divorced.  [place] in [place].  Used to be an appliance repairman but now on disability.  Nonsmoker.    ROS: All systems reviewed and negative except as per HPI.   Current Outpatient Prescriptions  Medication Sig Dispense Refill  . apixaban (ELIQUIS) 5 MG TABS tablet Take 1 tablet (5 mg total) by mouth 2 (two) times daily. 60 tablet 0  . atorvastatin (LIPITOR) 80 MG tablet Take 80 mg by mouth daily.    . carvedilol (COREG) 12.5 MG tablet Take 1 tablet (12.5 mg total) by mouth 2 (two) times daily with a meal. 60 tablet 0  . pantoprazole (PROTONIX) 40 MG tablet Take 40 mg by mouth 2 (two) times daily before a meal.    . potassium chloride SA  (K-DUR,KLOR-CON) 20 MEQ tablet Take 2 tablets (40 mEq total) by mouth 2 (two) times daily. 60 tablet 0  . spironolactone (ALDACTONE) 25 MG tablet Take 1 tablet (25 mg total) by mouth daily. 30 tablet 0  . torsemide (DEMADEX) 20 MG tablet Take 2 tablets (40 mg total) by mouth 2 (two) times daily. 60 tablet 3  . colchicine 0.6 MG tablet Take 1 tablet (0.6 mg total) by mouth daily. (Patient not taking: Reported on 01/05/2015) 30 tablet 0   No current facility-administered medications for this encounter.   BP 140/82 mmHg  Pulse 70  Resp 22  Wt 293 lb 8 oz (133.131 kg)  SpO2 94% General: NAD, obese.Arrived in a wheel chair.   Neck: Thick, JVP 8-9  no thyromegaly or thyroid nodule.  Lungs: Clear to auscultation bilaterally with normal respiratory effort. CV: Nondisplaced PMI.  Heart irregular S1/S2, no S3/S4, 1/6 SEM RUSB.  No carotid bruit.  .  Abdomen: Soft, nontender, no hepatosplenomegaly, mild distention. Neurologic: Alert and oriented x 3.  Psych: Normal affect. Extremities: No clubbing or cyanosis.  R and LLE compression wraps in place.   Assessment/Plan: 1) Chronic Diastolic CHF  Chronic diastolic CHF in setting of long-standing HTN and atrial fibrillation. Suspect hypertensive cardiomyopathy based on  echo.  NYHA IIIb. Volume status improved. Continue torsemide 40 mg twice a day and he is instructed to take an extra 20 mg torsemide for weight greater than 296 pounds.  Reinforced daily  weights, low salt diet, and limiting fluid intake to , 2 liters per day.   2) Atrial fibrillation- Chronic  ChadsVasc Score= 3.  Rate is controlled on current BB dose. On eliquis 5 mg twice a day. No bleeding problems.  3) Hypertension  Hypertensive cardiomyopathy with severe LVH.   4) Cellulitis - Completed 7 days of doxycycline 100 mg twice a day. Continue compression wraps  Follow up 6 in weeks.   CLEGG,AMY NP-C  06/09/2013 4:16 PM

## 2015-01-24 NOTE — Patient Instructions (Signed)
Take extra 20mg  tablet once daily for weight 296lb or greater.  Follow up 6 weeks.  Do the following things EVERYDAY: 1) Weigh yourself in the morning before breakfast. Write it down and keep it in a log. 2) Take your medicines as prescribed 3) Eat low salt foods-Limit salt (sodium) to 2000 mg per day.  4) Stay as active as you can everyday 5) Limit all fluids for the day to less than 2 liters

## 2015-01-26 ENCOUNTER — Telehealth (HOSPITAL_COMMUNITY): Payer: Self-pay | Admitting: Vascular Surgery

## 2015-01-26 DIAGNOSIS — I4891 Unspecified atrial fibrillation: Secondary | ICD-10-CM | POA: Diagnosis not present

## 2015-01-26 DIAGNOSIS — I739 Peripheral vascular disease, unspecified: Secondary | ICD-10-CM | POA: Diagnosis not present

## 2015-01-26 DIAGNOSIS — I872 Venous insufficiency (chronic) (peripheral): Secondary | ICD-10-CM | POA: Diagnosis not present

## 2015-01-26 DIAGNOSIS — I5033 Acute on chronic diastolic (congestive) heart failure: Secondary | ICD-10-CM | POA: Diagnosis not present

## 2015-01-26 DIAGNOSIS — L03115 Cellulitis of right lower limb: Secondary | ICD-10-CM | POA: Diagnosis not present

## 2015-01-26 DIAGNOSIS — I871 Compression of vein: Secondary | ICD-10-CM | POA: Diagnosis not present

## 2015-01-26 DIAGNOSIS — I509 Heart failure, unspecified: Secondary | ICD-10-CM | POA: Diagnosis not present

## 2015-01-26 DIAGNOSIS — I10 Essential (primary) hypertension: Secondary | ICD-10-CM | POA: Diagnosis not present

## 2015-01-26 NOTE — Telephone Encounter (Signed)
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 Please advise

## 2015-01-26 NOTE — Telephone Encounter (Signed)
Attempted to call back, no answer.

## 2015-01-29 DIAGNOSIS — I509 Heart failure, unspecified: Secondary | ICD-10-CM | POA: Diagnosis not present

## 2015-01-29 DIAGNOSIS — E538 Deficiency of other specified B group vitamins: Secondary | ICD-10-CM | POA: Diagnosis not present

## 2015-01-29 DIAGNOSIS — I10 Essential (primary) hypertension: Secondary | ICD-10-CM | POA: Diagnosis not present

## 2015-01-29 DIAGNOSIS — I4891 Unspecified atrial fibrillation: Secondary | ICD-10-CM | POA: Diagnosis not present

## 2015-01-29 DIAGNOSIS — M109 Gout, unspecified: Secondary | ICD-10-CM | POA: Diagnosis not present

## 2015-01-29 DIAGNOSIS — E780 Pure hypercholesterolemia: Secondary | ICD-10-CM | POA: Diagnosis not present

## 2015-01-29 DIAGNOSIS — I871 Compression of vein: Secondary | ICD-10-CM | POA: Diagnosis not present

## 2015-01-29 DIAGNOSIS — I872 Venous insufficiency (chronic) (peripheral): Secondary | ICD-10-CM | POA: Diagnosis not present

## 2015-01-29 DIAGNOSIS — I739 Peripheral vascular disease, unspecified: Secondary | ICD-10-CM | POA: Diagnosis not present

## 2015-01-29 DIAGNOSIS — Z125 Encounter for screening for malignant neoplasm of prostate: Secondary | ICD-10-CM | POA: Diagnosis not present

## 2015-01-29 DIAGNOSIS — I5033 Acute on chronic diastolic (congestive) heart failure: Secondary | ICD-10-CM | POA: Diagnosis not present

## 2015-01-29 DIAGNOSIS — L03115 Cellulitis of right lower limb: Secondary | ICD-10-CM | POA: Diagnosis not present

## 2015-01-31 DIAGNOSIS — I871 Compression of vein: Secondary | ICD-10-CM | POA: Diagnosis not present

## 2015-01-31 DIAGNOSIS — I5033 Acute on chronic diastolic (congestive) heart failure: Secondary | ICD-10-CM | POA: Diagnosis not present

## 2015-01-31 DIAGNOSIS — L03115 Cellulitis of right lower limb: Secondary | ICD-10-CM | POA: Diagnosis not present

## 2015-01-31 DIAGNOSIS — I872 Venous insufficiency (chronic) (peripheral): Secondary | ICD-10-CM | POA: Diagnosis not present

## 2015-01-31 DIAGNOSIS — I739 Peripheral vascular disease, unspecified: Secondary | ICD-10-CM | POA: Diagnosis not present

## 2015-01-31 DIAGNOSIS — I4891 Unspecified atrial fibrillation: Secondary | ICD-10-CM | POA: Diagnosis not present

## 2015-02-02 DIAGNOSIS — I872 Venous insufficiency (chronic) (peripheral): Secondary | ICD-10-CM | POA: Diagnosis not present

## 2015-02-02 DIAGNOSIS — I5033 Acute on chronic diastolic (congestive) heart failure: Secondary | ICD-10-CM | POA: Diagnosis not present

## 2015-02-02 DIAGNOSIS — L03115 Cellulitis of right lower limb: Secondary | ICD-10-CM | POA: Diagnosis not present

## 2015-02-02 DIAGNOSIS — I871 Compression of vein: Secondary | ICD-10-CM | POA: Diagnosis not present

## 2015-02-02 DIAGNOSIS — I739 Peripheral vascular disease, unspecified: Secondary | ICD-10-CM | POA: Diagnosis not present

## 2015-02-02 DIAGNOSIS — I4891 Unspecified atrial fibrillation: Secondary | ICD-10-CM | POA: Diagnosis not present

## 2015-02-05 DIAGNOSIS — I4891 Unspecified atrial fibrillation: Secondary | ICD-10-CM | POA: Diagnosis not present

## 2015-02-05 DIAGNOSIS — I871 Compression of vein: Secondary | ICD-10-CM | POA: Diagnosis not present

## 2015-02-05 DIAGNOSIS — L03115 Cellulitis of right lower limb: Secondary | ICD-10-CM | POA: Diagnosis not present

## 2015-02-05 DIAGNOSIS — I5033 Acute on chronic diastolic (congestive) heart failure: Secondary | ICD-10-CM | POA: Diagnosis not present

## 2015-02-05 DIAGNOSIS — I872 Venous insufficiency (chronic) (peripheral): Secondary | ICD-10-CM | POA: Diagnosis not present

## 2015-02-05 DIAGNOSIS — I739 Peripheral vascular disease, unspecified: Secondary | ICD-10-CM | POA: Diagnosis not present

## 2015-02-07 ENCOUNTER — Telehealth (HOSPITAL_COMMUNITY): Payer: Self-pay | Admitting: Vascular Surgery

## 2015-02-07 DIAGNOSIS — I871 Compression of vein: Secondary | ICD-10-CM | POA: Diagnosis not present

## 2015-02-07 DIAGNOSIS — L03115 Cellulitis of right lower limb: Secondary | ICD-10-CM | POA: Diagnosis not present

## 2015-02-07 DIAGNOSIS — I5033 Acute on chronic diastolic (congestive) heart failure: Secondary | ICD-10-CM | POA: Diagnosis not present

## 2015-02-07 DIAGNOSIS — I4891 Unspecified atrial fibrillation: Secondary | ICD-10-CM | POA: Diagnosis not present

## 2015-02-07 DIAGNOSIS — I872 Venous insufficiency (chronic) (peripheral): Secondary | ICD-10-CM | POA: Diagnosis not present

## 2015-02-07 DIAGNOSIS — I739 Peripheral vascular disease, unspecified: Secondary | ICD-10-CM | POA: Diagnosis not present

## 2015-02-07 NOTE — Telephone Encounter (Signed)
Pt daughter called .Marland Kitchen. Pt is on Eliquis [employer]  Co pay is 131.00 .Marland Kitchen. Pt will not pay that much for the medicine. [employer] told the pt daughter to contact the office to get a prior Auth to bring the co Pay down... Daughter wants to speak to someone about this matter.. Please advise

## 2015-02-09 DIAGNOSIS — I5033 Acute on chronic diastolic (congestive) heart failure: Secondary | ICD-10-CM | POA: Diagnosis not present

## 2015-02-09 DIAGNOSIS — I872 Venous insufficiency (chronic) (peripheral): Secondary | ICD-10-CM | POA: Diagnosis not present

## 2015-02-09 DIAGNOSIS — I739 Peripheral vascular disease, unspecified: Secondary | ICD-10-CM | POA: Diagnosis not present

## 2015-02-09 DIAGNOSIS — I871 Compression of vein: Secondary | ICD-10-CM | POA: Diagnosis not present

## 2015-02-09 DIAGNOSIS — I4891 Unspecified atrial fibrillation: Secondary | ICD-10-CM | POA: Diagnosis not present

## 2015-02-09 DIAGNOSIS — L03115 Cellulitis of right lower limb: Secondary | ICD-10-CM | POA: Diagnosis not present

## 2015-02-12 DIAGNOSIS — I871 Compression of vein: Secondary | ICD-10-CM | POA: Diagnosis not present

## 2015-02-12 DIAGNOSIS — L03115 Cellulitis of right lower limb: Secondary | ICD-10-CM | POA: Diagnosis not present

## 2015-02-12 DIAGNOSIS — I4891 Unspecified atrial fibrillation: Secondary | ICD-10-CM | POA: Diagnosis not present

## 2015-02-12 DIAGNOSIS — I872 Venous insufficiency (chronic) (peripheral): Secondary | ICD-10-CM | POA: Diagnosis not present

## 2015-02-12 DIAGNOSIS — I5033 Acute on chronic diastolic (congestive) heart failure: Secondary | ICD-10-CM | POA: Diagnosis not present

## 2015-02-12 DIAGNOSIS — I739 Peripheral vascular disease, unspecified: Secondary | ICD-10-CM | POA: Diagnosis not present

## 2015-02-14 DIAGNOSIS — L03115 Cellulitis of right lower limb: Secondary | ICD-10-CM | POA: Diagnosis not present

## 2015-02-14 DIAGNOSIS — I872 Venous insufficiency (chronic) (peripheral): Secondary | ICD-10-CM | POA: Diagnosis not present

## 2015-02-14 DIAGNOSIS — I4891 Unspecified atrial fibrillation: Secondary | ICD-10-CM | POA: Diagnosis not present

## 2015-02-14 DIAGNOSIS — I871 Compression of vein: Secondary | ICD-10-CM | POA: Diagnosis not present

## 2015-02-14 DIAGNOSIS — I5033 Acute on chronic diastolic (congestive) heart failure: Secondary | ICD-10-CM | POA: Diagnosis not present

## 2015-02-14 DIAGNOSIS — I739 Peripheral vascular disease, unspecified: Secondary | ICD-10-CM | POA: Diagnosis not present

## 2015-02-16 DIAGNOSIS — I872 Venous insufficiency (chronic) (peripheral): Secondary | ICD-10-CM | POA: Diagnosis not present

## 2015-02-16 DIAGNOSIS — I5033 Acute on chronic diastolic (congestive) heart failure: Secondary | ICD-10-CM | POA: Diagnosis not present

## 2015-02-16 DIAGNOSIS — I739 Peripheral vascular disease, unspecified: Secondary | ICD-10-CM | POA: Diagnosis not present

## 2015-02-16 DIAGNOSIS — I871 Compression of vein: Secondary | ICD-10-CM | POA: Diagnosis not present

## 2015-02-16 DIAGNOSIS — L03115 Cellulitis of right lower limb: Secondary | ICD-10-CM | POA: Diagnosis not present

## 2015-02-16 DIAGNOSIS — I4891 Unspecified atrial fibrillation: Secondary | ICD-10-CM | POA: Diagnosis not present

## 2015-02-19 DIAGNOSIS — I871 Compression of vein: Secondary | ICD-10-CM | POA: Diagnosis not present

## 2015-02-19 DIAGNOSIS — I5033 Acute on chronic diastolic (congestive) heart failure: Secondary | ICD-10-CM | POA: Diagnosis not present

## 2015-02-19 DIAGNOSIS — L03115 Cellulitis of right lower limb: Secondary | ICD-10-CM | POA: Diagnosis not present

## 2015-02-19 DIAGNOSIS — I739 Peripheral vascular disease, unspecified: Secondary | ICD-10-CM | POA: Diagnosis not present

## 2015-02-19 DIAGNOSIS — I872 Venous insufficiency (chronic) (peripheral): Secondary | ICD-10-CM | POA: Diagnosis not present

## 2015-02-19 DIAGNOSIS — I4891 Unspecified atrial fibrillation: Secondary | ICD-10-CM | POA: Diagnosis not present

## 2015-02-21 DIAGNOSIS — L03115 Cellulitis of right lower limb: Secondary | ICD-10-CM | POA: Diagnosis not present

## 2015-02-21 DIAGNOSIS — I5033 Acute on chronic diastolic (congestive) heart failure: Secondary | ICD-10-CM | POA: Diagnosis not present

## 2015-02-21 DIAGNOSIS — I4891 Unspecified atrial fibrillation: Secondary | ICD-10-CM | POA: Diagnosis not present

## 2015-02-21 DIAGNOSIS — I871 Compression of vein: Secondary | ICD-10-CM | POA: Diagnosis not present

## 2015-02-21 DIAGNOSIS — I739 Peripheral vascular disease, unspecified: Secondary | ICD-10-CM | POA: Diagnosis not present

## 2015-02-21 DIAGNOSIS — I872 Venous insufficiency (chronic) (peripheral): Secondary | ICD-10-CM | POA: Diagnosis not present

## 2015-02-23 ENCOUNTER — Encounter: Payer: Self-pay | Admitting: Internal Medicine

## 2015-02-23 DIAGNOSIS — I871 Compression of vein: Secondary | ICD-10-CM | POA: Diagnosis not present

## 2015-02-23 DIAGNOSIS — L03115 Cellulitis of right lower limb: Secondary | ICD-10-CM | POA: Diagnosis not present

## 2015-02-23 DIAGNOSIS — I4891 Unspecified atrial fibrillation: Secondary | ICD-10-CM | POA: Diagnosis not present

## 2015-02-23 DIAGNOSIS — I5033 Acute on chronic diastolic (congestive) heart failure: Secondary | ICD-10-CM | POA: Diagnosis not present

## 2015-02-23 DIAGNOSIS — I872 Venous insufficiency (chronic) (peripheral): Secondary | ICD-10-CM | POA: Diagnosis not present

## 2015-02-23 DIAGNOSIS — I739 Peripheral vascular disease, unspecified: Secondary | ICD-10-CM | POA: Diagnosis not present

## 2015-02-26 DIAGNOSIS — I739 Peripheral vascular disease, unspecified: Secondary | ICD-10-CM | POA: Diagnosis not present

## 2015-02-26 DIAGNOSIS — I871 Compression of vein: Secondary | ICD-10-CM | POA: Diagnosis not present

## 2015-02-26 DIAGNOSIS — L03115 Cellulitis of right lower limb: Secondary | ICD-10-CM | POA: Diagnosis not present

## 2015-02-26 DIAGNOSIS — I5033 Acute on chronic diastolic (congestive) heart failure: Secondary | ICD-10-CM | POA: Diagnosis not present

## 2015-02-26 DIAGNOSIS — I872 Venous insufficiency (chronic) (peripheral): Secondary | ICD-10-CM | POA: Diagnosis not present

## 2015-02-26 DIAGNOSIS — I4891 Unspecified atrial fibrillation: Secondary | ICD-10-CM | POA: Diagnosis not present

## 2015-02-27 DIAGNOSIS — I509 Heart failure, unspecified: Secondary | ICD-10-CM | POA: Diagnosis not present

## 2015-02-27 DIAGNOSIS — I4891 Unspecified atrial fibrillation: Secondary | ICD-10-CM | POA: Diagnosis not present

## 2015-02-27 DIAGNOSIS — I10 Essential (primary) hypertension: Secondary | ICD-10-CM | POA: Diagnosis not present

## 2015-02-28 DIAGNOSIS — I4891 Unspecified atrial fibrillation: Secondary | ICD-10-CM | POA: Diagnosis not present

## 2015-02-28 DIAGNOSIS — L03115 Cellulitis of right lower limb: Secondary | ICD-10-CM | POA: Diagnosis not present

## 2015-02-28 DIAGNOSIS — I739 Peripheral vascular disease, unspecified: Secondary | ICD-10-CM | POA: Diagnosis not present

## 2015-02-28 DIAGNOSIS — I872 Venous insufficiency (chronic) (peripheral): Secondary | ICD-10-CM | POA: Diagnosis not present

## 2015-02-28 DIAGNOSIS — I871 Compression of vein: Secondary | ICD-10-CM | POA: Diagnosis not present

## 2015-02-28 DIAGNOSIS — I5033 Acute on chronic diastolic (congestive) heart failure: Secondary | ICD-10-CM | POA: Diagnosis not present

## 2015-03-02 DIAGNOSIS — I4891 Unspecified atrial fibrillation: Secondary | ICD-10-CM | POA: Diagnosis not present

## 2015-03-02 DIAGNOSIS — L03115 Cellulitis of right lower limb: Secondary | ICD-10-CM | POA: Diagnosis not present

## 2015-03-02 DIAGNOSIS — I872 Venous insufficiency (chronic) (peripheral): Secondary | ICD-10-CM | POA: Diagnosis not present

## 2015-03-02 DIAGNOSIS — I871 Compression of vein: Secondary | ICD-10-CM | POA: Diagnosis not present

## 2015-03-02 DIAGNOSIS — I5033 Acute on chronic diastolic (congestive) heart failure: Secondary | ICD-10-CM | POA: Diagnosis not present

## 2015-03-02 DIAGNOSIS — I739 Peripheral vascular disease, unspecified: Secondary | ICD-10-CM | POA: Diagnosis not present

## 2015-03-05 DIAGNOSIS — I5033 Acute on chronic diastolic (congestive) heart failure: Secondary | ICD-10-CM | POA: Diagnosis not present

## 2015-03-05 DIAGNOSIS — I4891 Unspecified atrial fibrillation: Secondary | ICD-10-CM | POA: Diagnosis not present

## 2015-03-05 DIAGNOSIS — I871 Compression of vein: Secondary | ICD-10-CM | POA: Diagnosis not present

## 2015-03-05 DIAGNOSIS — I739 Peripheral vascular disease, unspecified: Secondary | ICD-10-CM | POA: Diagnosis not present

## 2015-03-05 DIAGNOSIS — I872 Venous insufficiency (chronic) (peripheral): Secondary | ICD-10-CM | POA: Diagnosis not present

## 2015-03-05 DIAGNOSIS — L03115 Cellulitis of right lower limb: Secondary | ICD-10-CM | POA: Diagnosis not present

## 2015-03-07 ENCOUNTER — Ambulatory Visit (HOSPITAL_COMMUNITY)
Admission: RE | Admit: 2015-03-07 | Discharge: 2015-03-07 | Disposition: A | Discharge status: Home / Self Care | Payer: Commercial Managed Care - HMO | Source: Ambulatory Visit | Attending: Internal Medicine | Admitting: Internal Medicine

## 2015-03-07 ENCOUNTER — Encounter (HOSPITAL_COMMUNITY): Payer: Self-pay

## 2015-03-07 VITALS — BP 158/79 | HR 85 | Resp 18 | Wt 302.8 lb

## 2015-03-07 DIAGNOSIS — I482 Chronic atrial fibrillation, unspecified: Secondary | ICD-10-CM

## 2015-03-07 DIAGNOSIS — R0600 Dyspnea, unspecified: Secondary | ICD-10-CM

## 2015-03-07 DIAGNOSIS — I4891 Unspecified atrial fibrillation: Secondary | ICD-10-CM | POA: Diagnosis not present

## 2015-03-07 DIAGNOSIS — I10 Essential (primary) hypertension: Secondary | ICD-10-CM | POA: Insufficient documentation

## 2015-03-07 DIAGNOSIS — I5032 Chronic diastolic (congestive) heart failure: Secondary | ICD-10-CM | POA: Diagnosis not present

## 2015-03-07 DIAGNOSIS — I871 Compression of vein: Secondary | ICD-10-CM | POA: Diagnosis not present

## 2015-03-07 DIAGNOSIS — I872 Venous insufficiency (chronic) (peripheral): Secondary | ICD-10-CM | POA: Diagnosis not present

## 2015-03-07 DIAGNOSIS — I739 Peripheral vascular disease, unspecified: Secondary | ICD-10-CM | POA: Diagnosis not present

## 2015-03-07 DIAGNOSIS — L03115 Cellulitis of right lower limb: Secondary | ICD-10-CM | POA: Diagnosis not present

## 2015-03-07 DIAGNOSIS — I5033 Acute on chronic diastolic (congestive) heart failure: Secondary | ICD-10-CM | POA: Diagnosis not present

## 2015-03-07 LAB — BASIC METABOLIC PANEL
Anion gap: 11 (ref 5–15)
BUN: 23 mg/dL (ref 6–23)
CO2: 23 mmol/L (ref 19–32)
Calcium: 9.2 mg/dL (ref 8.4–10.5)
Chloride: 93 mmol/L — ABNORMAL LOW (ref 96–112)
Creatinine, Ser: 1.16 mg/dL (ref 0.50–1.35)
GFR calc Af Amer: 73 mL/min — ABNORMAL LOW (ref >90)
GFR calc non Af Amer: 63 mL/min — ABNORMAL LOW (ref >90)
Glucose, Bld: 157 mg/dL — ABNORMAL HIGH (ref 70–99)
Potassium: 5 mmol/L (ref 3.5–5.1)
Sodium: 127 mmol/L — ABNORMAL LOW (ref 135–145)

## 2015-03-07 LAB — BRAIN NATRIURETIC PEPTIDE: B Natriuretic Peptide: 401.4 pg/mL — ABNORMAL HIGH (ref 0.0–100.0)

## 2015-03-07 MED ORDER — CARVEDILOL 12.5 MG PO TABS: 18.7500 mg | ORAL_TABLET | Freq: Two times a day (BID) | ORAL | Status: DC

## 2015-03-07 MED ORDER — TORSEMIDE 20 MG PO TABS: 60.0000 mg | ORAL_TABLET | Freq: Two times a day (BID) | ORAL | Status: DC

## 2015-03-07 NOTE — Patient Instructions (Signed)
INCREASE Torsemide to 60mg  (3 tablets) twice daily.  INCREASE Carvedilol (Coreg) to 18.75mg  (1.5 tablets) twice daily.  Return in 1-2 weeks for lab work.  Follow up 4 weeks.  Do the following things EVERYDAY: 1) Weigh yourself in the morning before breakfast. Write it down and keep it in a log. 2) Take your medicines as prescribed 3) Eat low salt foods-Limit salt (sodium) to 2000 mg per day.  4) Stay as active as you can everyday 5) Limit all fluids for the day to less than 2 liters

## 2015-03-07 NOTE — Progress Notes (Signed)
Patient ID: Jesus [name], male   DOB: 12/13/1945, 69 y.o.   MRN: [id number] PCP: Gerre PebblesSally [name] [name] / Dr Sedalia Mutaox   69 yo with history of obesity, chronic atrial fibrillation, resistant HTN, and diastolic CHF.  He was admitted with acute/chornic diastolic CHF in 1/16 and diuresed.  At that time, he agreed to start anticoagulation.    He returns for follow up. [facility]. Weight has trended up.  He is not particularly compliant with low sodium diet and apparently drinks a fair amount of beer.  He denies exertional dyspnea but is not very active due to knee pain.  No chest pain. No lightheadedness or palpitations.  BP is running high again today.   Labs (8/12): K 4.6, creatinine 0.62, BNP 227, HCT 39.9 Labs (5/13): LDL 116, HDL 30, proBNP 388 Labs (6/13): K 3.1, creatinine 0.7, proBNP 282, immunofixation with no monoclonal protein.  Labs (7/13): K 4.4, creatinine 1.24, BNP 319 Labs (10/13): K 4.7, creatinine 1.3, LDL 138 Labs (2/16): k 3.6 => 4.2, Creatinine 1.54 => 1.2, HCT 36.9  PMH: 1. Obesity 2. Atrial fibrillation: First noted in 8/12, persistent.  3. Diastolic CHF: Echo (5/13) with severe LVH, biatrial enlargement, EF 50-55%.  Echo (1/16) with EF 55-60%, moderate LVH, mild RV dilation with normal systolic function.  4. HTN: since late teens.  5. Chronic lower extremity edema/venous insufficiency.  6. OA knees: severe.  7. Gout  FH: Father with atrial fibrillation.   SH: Divorced.  [place] in [place].  Used to be an appliance repairman but now on disability.  Nonsmoker.    ROS: All systems reviewed and negative except as per HPI.   Current Outpatient Prescriptions  Medication Sig Dispense Refill  . apixaban (ELIQUIS) 5 MG TABS tablet Take 1 tablet (5 mg total) by mouth 2 (two) times daily. 60 tablet 0  . atorvastatin (LIPITOR) 80 MG tablet Take 80 mg by mouth daily.    . carvedilol (COREG) 12.5 MG tablet Take 1.5 tablets (18.75 mg total) by mouth 2  (two) times daily with a meal. 90 tablet 3  . colchicine 0.6 MG tablet Take 1 tablet (0.6 mg total) by mouth daily. 30 tablet 0  . pantoprazole (PROTONIX) 40 MG tablet Take 40 mg by mouth 2 (two) times daily before a meal.    . potassium chloride SA (K-DUR,KLOR-CON) 20 MEQ tablet Take 2 tablets (40 mEq total) by mouth 2 (two) times daily. 60 tablet 0  . spironolactone (ALDACTONE) 25 MG tablet Take 1 tablet (25 mg total) by mouth daily. 30 tablet 0  . torsemide (DEMADEX) 20 MG tablet Take 3 tablets (60 mg total) by mouth 2 (two) times daily. Take additional 20mg  tablet once daily for weight 296lb or greater. 180 tablet 3   No current facility-administered medications for this encounter.   BP 158/79 mmHg  Pulse 85  Resp 18  Wt 302 lb 12 oz (137.326 kg)  SpO2 99% General: NAD, obese.   Neck: Thick, JVP 8-9 cm,  no thyromegaly or thyroid nodule.  Lungs: Clear to auscultation bilaterally with normal respiratory effort. CV: Nondisplaced PMI.  Heart irregular S1/S2, no S3/S4, 1/6 SEM RUSB.  No carotid bruit.  .  Abdomen: Soft, nontender, no hepatosplenomegaly, mild distention. Neurologic: Alert and oriented x 3.  Psych: Normal affect. Extremities: No clubbing or cyanosis.  R and LLE compression wraps in place, 1+ edema ankles.   Assessment/Plan: 1) Chronic Diastolic CHF  Chronic diastolic CHF in setting  of long-standing HTN and atrial fibrillation. Suspect hypertensive cardiomyopathy based on  echo.  NYHA III probably though not very active. Weight is up and he looks to volume overloaded on exam.  - We discussed diet (low sodium, keep < 2000 cc/day fluid).  - Increase torsemide to 60 mg bid.  - BMET/BNP today and repeat BMET 10 days.  - Followup in 1 month.  2) Atrial fibrillation Chronic.  ChadsVasc Score = 3.  Rate is controlled with Coreg. On eliquis 5 mg twice a day. No bleeding problems. HCT stable in 2/16.  3) Hypertension  Hypertensive cardiomyopathy with severe LVH.  BP remains  high, will increase Coreg to 18.75 mg bid.   Follow up 4 weeks.   [name]  03/07/2015

## 2015-03-09 DIAGNOSIS — I872 Venous insufficiency (chronic) (peripheral): Secondary | ICD-10-CM | POA: Diagnosis not present

## 2015-03-09 DIAGNOSIS — I4891 Unspecified atrial fibrillation: Secondary | ICD-10-CM | POA: Diagnosis not present

## 2015-03-09 DIAGNOSIS — I5033 Acute on chronic diastolic (congestive) heart failure: Secondary | ICD-10-CM | POA: Diagnosis not present

## 2015-03-09 DIAGNOSIS — I739 Peripheral vascular disease, unspecified: Secondary | ICD-10-CM | POA: Diagnosis not present

## 2015-03-09 DIAGNOSIS — I871 Compression of vein: Secondary | ICD-10-CM | POA: Diagnosis not present

## 2015-03-09 DIAGNOSIS — L03115 Cellulitis of right lower limb: Secondary | ICD-10-CM | POA: Diagnosis not present

## 2015-03-12 DIAGNOSIS — I872 Venous insufficiency (chronic) (peripheral): Secondary | ICD-10-CM | POA: Diagnosis not present

## 2015-03-12 DIAGNOSIS — I5033 Acute on chronic diastolic (congestive) heart failure: Secondary | ICD-10-CM | POA: Diagnosis not present

## 2015-03-12 DIAGNOSIS — I871 Compression of vein: Secondary | ICD-10-CM | POA: Diagnosis not present

## 2015-03-12 DIAGNOSIS — L03115 Cellulitis of right lower limb: Secondary | ICD-10-CM | POA: Diagnosis not present

## 2015-03-12 DIAGNOSIS — I739 Peripheral vascular disease, unspecified: Secondary | ICD-10-CM | POA: Diagnosis not present

## 2015-03-12 DIAGNOSIS — I4891 Unspecified atrial fibrillation: Secondary | ICD-10-CM | POA: Diagnosis not present

## 2015-03-14 DIAGNOSIS — I871 Compression of vein: Secondary | ICD-10-CM | POA: Diagnosis not present

## 2015-03-14 DIAGNOSIS — L03115 Cellulitis of right lower limb: Secondary | ICD-10-CM | POA: Diagnosis not present

## 2015-03-14 DIAGNOSIS — I4891 Unspecified atrial fibrillation: Secondary | ICD-10-CM | POA: Diagnosis not present

## 2015-03-14 DIAGNOSIS — I872 Venous insufficiency (chronic) (peripheral): Secondary | ICD-10-CM | POA: Diagnosis not present

## 2015-03-14 DIAGNOSIS — I739 Peripheral vascular disease, unspecified: Secondary | ICD-10-CM | POA: Diagnosis not present

## 2015-03-14 DIAGNOSIS — I5033 Acute on chronic diastolic (congestive) heart failure: Secondary | ICD-10-CM | POA: Diagnosis not present

## 2015-03-16 DIAGNOSIS — I5033 Acute on chronic diastolic (congestive) heart failure: Secondary | ICD-10-CM | POA: Diagnosis not present

## 2015-03-16 DIAGNOSIS — I4891 Unspecified atrial fibrillation: Secondary | ICD-10-CM | POA: Diagnosis not present

## 2015-03-16 DIAGNOSIS — I739 Peripheral vascular disease, unspecified: Secondary | ICD-10-CM | POA: Diagnosis not present

## 2015-03-16 DIAGNOSIS — I872 Venous insufficiency (chronic) (peripheral): Secondary | ICD-10-CM | POA: Diagnosis not present

## 2015-03-16 DIAGNOSIS — L03115 Cellulitis of right lower limb: Secondary | ICD-10-CM | POA: Diagnosis not present

## 2015-03-16 DIAGNOSIS — I871 Compression of vein: Secondary | ICD-10-CM | POA: Diagnosis not present

## 2015-03-19 DIAGNOSIS — I739 Peripheral vascular disease, unspecified: Secondary | ICD-10-CM | POA: Diagnosis not present

## 2015-03-19 DIAGNOSIS — I5033 Acute on chronic diastolic (congestive) heart failure: Secondary | ICD-10-CM | POA: Diagnosis not present

## 2015-03-19 DIAGNOSIS — L03115 Cellulitis of right lower limb: Secondary | ICD-10-CM | POA: Diagnosis not present

## 2015-03-19 DIAGNOSIS — I4891 Unspecified atrial fibrillation: Secondary | ICD-10-CM | POA: Diagnosis not present

## 2015-03-19 DIAGNOSIS — I872 Venous insufficiency (chronic) (peripheral): Secondary | ICD-10-CM | POA: Diagnosis not present

## 2015-03-19 DIAGNOSIS — I871 Compression of vein: Secondary | ICD-10-CM | POA: Diagnosis not present

## 2015-03-19 DIAGNOSIS — I10 Essential (primary) hypertension: Secondary | ICD-10-CM | POA: Diagnosis not present

## 2015-03-19 DIAGNOSIS — R0602 Shortness of breath: Secondary | ICD-10-CM | POA: Diagnosis not present

## 2015-03-20 DIAGNOSIS — I872 Venous insufficiency (chronic) (peripheral): Secondary | ICD-10-CM | POA: Diagnosis not present

## 2015-03-20 DIAGNOSIS — I4891 Unspecified atrial fibrillation: Secondary | ICD-10-CM | POA: Diagnosis not present

## 2015-03-20 DIAGNOSIS — L03115 Cellulitis of right lower limb: Secondary | ICD-10-CM | POA: Diagnosis not present

## 2015-03-20 DIAGNOSIS — I871 Compression of vein: Secondary | ICD-10-CM | POA: Diagnosis not present

## 2015-03-20 DIAGNOSIS — I739 Peripheral vascular disease, unspecified: Secondary | ICD-10-CM | POA: Diagnosis not present

## 2015-03-20 DIAGNOSIS — I5033 Acute on chronic diastolic (congestive) heart failure: Secondary | ICD-10-CM | POA: Diagnosis not present

## 2015-03-20 NOTE — Telephone Encounter (Signed)
Pt PA approved on 03/03/15 x 1 year

## 2015-03-21 ENCOUNTER — Other Ambulatory Visit (HOSPITAL_COMMUNITY): Payer: Commercial Managed Care - HMO

## 2015-03-21 DIAGNOSIS — L03115 Cellulitis of right lower limb: Secondary | ICD-10-CM | POA: Diagnosis not present

## 2015-03-21 DIAGNOSIS — I5033 Acute on chronic diastolic (congestive) heart failure: Secondary | ICD-10-CM | POA: Diagnosis not present

## 2015-03-21 DIAGNOSIS — I871 Compression of vein: Secondary | ICD-10-CM | POA: Diagnosis not present

## 2015-03-21 DIAGNOSIS — I872 Venous insufficiency (chronic) (peripheral): Secondary | ICD-10-CM | POA: Diagnosis not present

## 2015-03-21 DIAGNOSIS — I739 Peripheral vascular disease, unspecified: Secondary | ICD-10-CM | POA: Diagnosis not present

## 2015-03-21 DIAGNOSIS — I4891 Unspecified atrial fibrillation: Secondary | ICD-10-CM | POA: Diagnosis not present

## 2015-03-21 DIAGNOSIS — I509 Heart failure, unspecified: Secondary | ICD-10-CM | POA: Diagnosis not present

## 2015-03-21 DIAGNOSIS — I10 Essential (primary) hypertension: Secondary | ICD-10-CM | POA: Diagnosis not present

## 2015-03-23 DIAGNOSIS — I5033 Acute on chronic diastolic (congestive) heart failure: Secondary | ICD-10-CM | POA: Diagnosis not present

## 2015-03-23 DIAGNOSIS — I871 Compression of vein: Secondary | ICD-10-CM | POA: Diagnosis not present

## 2015-03-23 DIAGNOSIS — I872 Venous insufficiency (chronic) (peripheral): Secondary | ICD-10-CM | POA: Diagnosis not present

## 2015-03-23 DIAGNOSIS — I739 Peripheral vascular disease, unspecified: Secondary | ICD-10-CM | POA: Diagnosis not present

## 2015-03-23 DIAGNOSIS — I4891 Unspecified atrial fibrillation: Secondary | ICD-10-CM | POA: Diagnosis not present

## 2015-03-23 DIAGNOSIS — L03115 Cellulitis of right lower limb: Secondary | ICD-10-CM | POA: Diagnosis not present

## 2015-03-26 DIAGNOSIS — I4891 Unspecified atrial fibrillation: Secondary | ICD-10-CM | POA: Diagnosis not present

## 2015-03-26 DIAGNOSIS — I5033 Acute on chronic diastolic (congestive) heart failure: Secondary | ICD-10-CM | POA: Diagnosis not present

## 2015-03-26 DIAGNOSIS — L03115 Cellulitis of right lower limb: Secondary | ICD-10-CM | POA: Diagnosis not present

## 2015-03-26 DIAGNOSIS — I871 Compression of vein: Secondary | ICD-10-CM | POA: Diagnosis not present

## 2015-03-26 DIAGNOSIS — I872 Venous insufficiency (chronic) (peripheral): Secondary | ICD-10-CM | POA: Diagnosis not present

## 2015-03-26 DIAGNOSIS — I739 Peripheral vascular disease, unspecified: Secondary | ICD-10-CM | POA: Diagnosis not present

## 2015-03-28 DIAGNOSIS — I4891 Unspecified atrial fibrillation: Secondary | ICD-10-CM | POA: Diagnosis not present

## 2015-03-28 DIAGNOSIS — I871 Compression of vein: Secondary | ICD-10-CM | POA: Diagnosis not present

## 2015-03-28 DIAGNOSIS — I739 Peripheral vascular disease, unspecified: Secondary | ICD-10-CM | POA: Diagnosis not present

## 2015-03-28 DIAGNOSIS — I872 Venous insufficiency (chronic) (peripheral): Secondary | ICD-10-CM | POA: Diagnosis not present

## 2015-03-28 DIAGNOSIS — L03115 Cellulitis of right lower limb: Secondary | ICD-10-CM | POA: Diagnosis not present

## 2015-03-28 DIAGNOSIS — I5033 Acute on chronic diastolic (congestive) heart failure: Secondary | ICD-10-CM | POA: Diagnosis not present

## 2015-03-30 DIAGNOSIS — I739 Peripheral vascular disease, unspecified: Secondary | ICD-10-CM | POA: Diagnosis not present

## 2015-03-30 DIAGNOSIS — I4891 Unspecified atrial fibrillation: Secondary | ICD-10-CM | POA: Diagnosis not present

## 2015-03-30 DIAGNOSIS — L03115 Cellulitis of right lower limb: Secondary | ICD-10-CM | POA: Diagnosis not present

## 2015-03-30 DIAGNOSIS — I5033 Acute on chronic diastolic (congestive) heart failure: Secondary | ICD-10-CM | POA: Diagnosis not present

## 2015-03-30 DIAGNOSIS — I872 Venous insufficiency (chronic) (peripheral): Secondary | ICD-10-CM | POA: Diagnosis not present

## 2015-03-30 DIAGNOSIS — I871 Compression of vein: Secondary | ICD-10-CM | POA: Diagnosis not present

## 2015-04-02 ENCOUNTER — Ambulatory Visit (HOSPITAL_COMMUNITY)
Admission: RE | Admit: 2015-04-02 | Discharge: 2015-04-02 | Disposition: A | Discharge status: Home / Self Care | Payer: Commercial Managed Care - HMO | Source: Ambulatory Visit | Attending: Cardiology | Admitting: Cardiology

## 2015-04-02 VITALS — BP 102/78 | HR 85 | Wt 300.4 lb

## 2015-04-02 DIAGNOSIS — Z7902 Long term (current) use of antithrombotics/antiplatelets: Secondary | ICD-10-CM | POA: Insufficient documentation

## 2015-04-02 DIAGNOSIS — I482 Chronic atrial fibrillation, unspecified: Secondary | ICD-10-CM

## 2015-04-02 DIAGNOSIS — Z79899 Other long term (current) drug therapy: Secondary | ICD-10-CM | POA: Insufficient documentation

## 2015-04-02 DIAGNOSIS — I70269 Atherosclerosis of native arteries of extremities with gangrene, unspecified extremity: Secondary | ICD-10-CM | POA: Diagnosis not present

## 2015-04-02 DIAGNOSIS — I872 Venous insufficiency (chronic) (peripheral): Secondary | ICD-10-CM | POA: Diagnosis not present

## 2015-04-02 DIAGNOSIS — M109 Gout, unspecified: Secondary | ICD-10-CM | POA: Diagnosis not present

## 2015-04-02 DIAGNOSIS — I739 Peripheral vascular disease, unspecified: Secondary | ICD-10-CM | POA: Diagnosis not present

## 2015-04-02 DIAGNOSIS — I5032 Chronic diastolic (congestive) heart failure: Secondary | ICD-10-CM | POA: Insufficient documentation

## 2015-04-02 DIAGNOSIS — I4891 Unspecified atrial fibrillation: Secondary | ICD-10-CM | POA: Diagnosis not present

## 2015-04-02 DIAGNOSIS — I10 Essential (primary) hypertension: Secondary | ICD-10-CM | POA: Insufficient documentation

## 2015-04-02 DIAGNOSIS — I5033 Acute on chronic diastolic (congestive) heart failure: Secondary | ICD-10-CM | POA: Diagnosis not present

## 2015-04-02 DIAGNOSIS — E669 Obesity, unspecified: Secondary | ICD-10-CM | POA: Diagnosis not present

## 2015-04-02 DIAGNOSIS — I110 Hypertensive heart disease with heart failure: Secondary | ICD-10-CM | POA: Insufficient documentation

## 2015-04-02 DIAGNOSIS — I43 Cardiomyopathy in diseases classified elsewhere: Secondary | ICD-10-CM | POA: Insufficient documentation

## 2015-04-02 DIAGNOSIS — M170 Bilateral primary osteoarthritis of knee: Secondary | ICD-10-CM | POA: Diagnosis not present

## 2015-04-02 DIAGNOSIS — L03115 Cellulitis of right lower limb: Secondary | ICD-10-CM | POA: Diagnosis not present

## 2015-04-02 DIAGNOSIS — I871 Compression of vein: Secondary | ICD-10-CM | POA: Diagnosis not present

## 2015-04-02 LAB — LIPID PANEL
CHOLESTEROL: 156 mg/dL (ref 0–200)
HDL: 29 mg/dL — ABNORMAL LOW (ref >39)
LDL CALC: 98 mg/dL (ref 0–99)
TRIGLYCERIDES: 145 mg/dL (ref <150)
Total CHOL/HDL Ratio: 5.4 RATIO
VLDL: 29 mg/dL (ref 0–40)

## 2015-04-02 LAB — COMPREHENSIVE METABOLIC PANEL
ALT: 10 U/L (ref 0–53)
AST: 15 U/L (ref 0–37)
Albumin: 3.9 g/dL (ref 3.5–5.2)
Alkaline Phosphatase: 121 U/L — ABNORMAL HIGH (ref 39–117)
Anion gap: 11 (ref 5–15)
BILIRUBIN TOTAL: 0.8 mg/dL (ref 0.3–1.2)
BUN: 27 mg/dL — AB (ref 6–23)
CALCIUM: 9.4 mg/dL (ref 8.4–10.5)
CO2: 25 mmol/L (ref 19–32)
Chloride: 91 mmol/L — ABNORMAL LOW (ref 96–112)
Creatinine, Ser: 1.1 mg/dL (ref 0.50–1.35)
GFR calc Af Amer: 78 mL/min — ABNORMAL LOW (ref >90)
GFR calc non Af Amer: 67 mL/min — ABNORMAL LOW (ref >90)
GLUCOSE: 90 mg/dL (ref 70–99)
Potassium: 4.8 mmol/L (ref 3.5–5.1)
Sodium: 127 mmol/L — ABNORMAL LOW (ref 135–145)
Total Protein: 7.3 g/dL (ref 6.0–8.3)

## 2015-04-02 LAB — BRAIN NATRIURETIC PEPTIDE: B Natriuretic Peptide: 223.9 pg/mL — ABNORMAL HIGH (ref 0.0–100.0)

## 2015-04-02 MED ORDER — CARVEDILOL 12.5 MG PO TABS: 18.7500 mg | ORAL_TABLET | Freq: Two times a day (BID) | ORAL | Status: DC

## 2015-04-02 MED ORDER — POTASSIUM CHLORIDE CRYS ER 20 MEQ PO TBCR: 40.0000 meq | EXTENDED_RELEASE_TABLET | Freq: Every day | ORAL | Status: DC

## 2015-04-02 MED ORDER — TORSEMIDE 20 MG PO TABS: 60.0000 mg | ORAL_TABLET | Freq: Two times a day (BID) | ORAL | Status: DC

## 2015-04-02 NOTE — Patient Instructions (Signed)
PLEASE SEE MEDICATION LIST BELOW  PLEASE TAKE ELIQUIS, CARVEDILOL AND TORSEMIDE TWICE A DAY  Labs today  Your physician recommends that you schedule a follow-up appointment in: 1 month

## 2015-04-02 NOTE — Progress Notes (Signed)
Patient ID: Jesus [name], male   DOB: 19-Apr-1946, 69 y.o.   MRN: [id number] PCP: Gerre PebblesSally [name] [name] / Dr Sedalia Mutaox   69 yo with history of obesity, chronic atrial fibrillation, resistant HTN, and diastolic CHF.  He is taking all his bid meds once daily.  Weight is down about 2 lbs.  He is having his lower legs wrapped by home care which is helping to limit his lower extremity edema.  Eating high salt foods. Uses motorized scooter. He is short of breath after walking about 100 feet.  Knees also limit him.  He is able to walk into stores but then uses the motorized scooter.    Labs (8/12): K 4.6, creatinine 0.62, BNP 227, HCT 39.9 Labs (5/13): LDL 116, HDL 30, proBNP 388 Labs (6/13): K 3.1, creatinine 0.7, proBNP 282, immunofixation with no monoclonal protein.  Labs (7/13): K 4.4, creatinine 1.24, BNP 319 Labs (10/13): K 4.7, creatinine 1.3, LDL 138 Labs (01/12/2015): k 3.6 Creatinine 1.54 Labs (3/16): K 5, creatinine 1.16, BNP 404, Na 127  PMH: 1. Obesity 2. Atrial fibrillation: First noted in 8/12, persistent.  3. Diastolic CHF: Echo (5/13) with severe LVH, biatrial enlargement, EF 50-55%.  4. HTN: since late teens.  5. Chronic lower extremity edema/venous insufficiency.  6. OA knees: severe.  7. Gout 8. Chronic hyponatremia  FH: Father with atrial fibrillation.   SH: Divorced.  Lives in [place].  Used to be an appliance repairman but now on disability.  Nonsmoker.    ROS: All systems reviewed and negative except as per HPI.   Current Outpatient Prescriptions  Medication Sig Dispense Refill  . apixaban (ELIQUIS) 5 MG TABS tablet Take 1 tablet (5 mg total) by mouth 2 (two) times daily. 60 tablet 0  . atorvastatin (LIPITOR) 80 MG tablet Take 80 mg by mouth daily.    . carvedilol (COREG) 12.5 MG tablet Take 1.5 tablets (18.75 mg total) by mouth 2 (two) times daily with a meal. 90 tablet 3  . colchicine 0.6 MG tablet Take 1 tablet (0.6 mg total) by mouth daily. 30 tablet 0  . pantoprazole  (PROTONIX) 40 MG tablet Take 40 mg by mouth 2 (two) times daily before a meal.    . potassium chloride SA (K-DUR,KLOR-CON) 20 MEQ tablet Take 2 tablets (40 mEq total) by mouth daily. 60 tablet 0  . spironolactone (ALDACTONE) 25 MG tablet Take 1 tablet (25 mg total) by mouth daily. 30 tablet 0  . torsemide (DEMADEX) 20 MG tablet Take 3 tablets (60 mg total) by mouth 2 (two) times daily. Take additional 20mg  tablet once daily for weight 296lb or greater. 180 tablet 3   No current facility-administered medications for this encounter.   BP 102/78 mmHg  Pulse 85  Wt 300 lb 6.4 oz (136.261 kg)  SpO2 96% General: NAD, obese.  Arrived in a wheel chair.   Neck: Thick, JVP 10-12, no thyromegaly or thyroid nodule.  Lungs: Clear to auscultation bilaterally with normal respiratory effort. CV: Nondisplaced PMI.  Heart irregular S1/S2, no S3/S4, 1/6 SEM RUSB.  No carotid bruit.  .  Abdomen: Soft, nontender, no hepatosplenomegaly, mild distention. Neurologic: Alert and oriented x 3.  Psych: Normal affect. Extremities: No clubbing or cyanosis.  R and LLE compression wraps in place.   Assessment/Plan: 1) Chronic Diastolic CHF  Chronic diastolic CHF in setting of long-standing HTN and atrial fibrillation. NYHA class III symptoms.  He looks volume overloaded on exam.  He has been taking torsemide only once a  day.  - Go back to taking torsemide 60 mg bid.  Continue to take KCl just once daily, last K was 5.  - BMET/BNP today.  2) Atrial fibrillation- Chronic  ChadsVasc Score= 3.  Rate controlled currenlty; needs to increase Coreg back to bid. On eliquis 5 mg twice a day but has been taking qday (change back to bid).  3) Hypertension  Hypertensive cardiomyopathy with severe LVH.  BP now seems controlled.   Followup in 1 month.    [name]  04/02/2015

## 2015-04-04 DIAGNOSIS — I739 Peripheral vascular disease, unspecified: Secondary | ICD-10-CM | POA: Diagnosis not present

## 2015-04-04 DIAGNOSIS — I4891 Unspecified atrial fibrillation: Secondary | ICD-10-CM | POA: Diagnosis not present

## 2015-04-04 DIAGNOSIS — I5033 Acute on chronic diastolic (congestive) heart failure: Secondary | ICD-10-CM | POA: Diagnosis not present

## 2015-04-04 DIAGNOSIS — L03115 Cellulitis of right lower limb: Secondary | ICD-10-CM | POA: Diagnosis not present

## 2015-04-04 DIAGNOSIS — I871 Compression of vein: Secondary | ICD-10-CM | POA: Diagnosis not present

## 2015-04-04 DIAGNOSIS — I872 Venous insufficiency (chronic) (peripheral): Secondary | ICD-10-CM | POA: Diagnosis not present

## 2015-04-06 DIAGNOSIS — I739 Peripheral vascular disease, unspecified: Secondary | ICD-10-CM | POA: Diagnosis not present

## 2015-04-06 DIAGNOSIS — I5033 Acute on chronic diastolic (congestive) heart failure: Secondary | ICD-10-CM | POA: Diagnosis not present

## 2015-04-06 DIAGNOSIS — I871 Compression of vein: Secondary | ICD-10-CM | POA: Diagnosis not present

## 2015-04-06 DIAGNOSIS — I4891 Unspecified atrial fibrillation: Secondary | ICD-10-CM | POA: Diagnosis not present

## 2015-04-06 DIAGNOSIS — L03115 Cellulitis of right lower limb: Secondary | ICD-10-CM | POA: Diagnosis not present

## 2015-04-06 DIAGNOSIS — I872 Venous insufficiency (chronic) (peripheral): Secondary | ICD-10-CM | POA: Diagnosis not present

## 2015-04-09 DIAGNOSIS — I872 Venous insufficiency (chronic) (peripheral): Secondary | ICD-10-CM | POA: Diagnosis not present

## 2015-04-09 DIAGNOSIS — I4891 Unspecified atrial fibrillation: Secondary | ICD-10-CM | POA: Diagnosis not present

## 2015-04-09 DIAGNOSIS — I871 Compression of vein: Secondary | ICD-10-CM | POA: Diagnosis not present

## 2015-04-09 DIAGNOSIS — I739 Peripheral vascular disease, unspecified: Secondary | ICD-10-CM | POA: Diagnosis not present

## 2015-04-09 DIAGNOSIS — L03115 Cellulitis of right lower limb: Secondary | ICD-10-CM | POA: Diagnosis not present

## 2015-04-09 DIAGNOSIS — I5033 Acute on chronic diastolic (congestive) heart failure: Secondary | ICD-10-CM | POA: Diagnosis not present

## 2015-04-11 DIAGNOSIS — I4891 Unspecified atrial fibrillation: Secondary | ICD-10-CM | POA: Diagnosis not present

## 2015-04-11 DIAGNOSIS — I871 Compression of vein: Secondary | ICD-10-CM | POA: Diagnosis not present

## 2015-04-11 DIAGNOSIS — L03115 Cellulitis of right lower limb: Secondary | ICD-10-CM | POA: Diagnosis not present

## 2015-04-11 DIAGNOSIS — I739 Peripheral vascular disease, unspecified: Secondary | ICD-10-CM | POA: Diagnosis not present

## 2015-04-11 DIAGNOSIS — I872 Venous insufficiency (chronic) (peripheral): Secondary | ICD-10-CM | POA: Diagnosis not present

## 2015-04-11 DIAGNOSIS — I5033 Acute on chronic diastolic (congestive) heart failure: Secondary | ICD-10-CM | POA: Diagnosis not present

## 2015-04-13 DIAGNOSIS — I872 Venous insufficiency (chronic) (peripheral): Secondary | ICD-10-CM | POA: Diagnosis not present

## 2015-04-13 DIAGNOSIS — I871 Compression of vein: Secondary | ICD-10-CM | POA: Diagnosis not present

## 2015-04-13 DIAGNOSIS — L03115 Cellulitis of right lower limb: Secondary | ICD-10-CM | POA: Diagnosis not present

## 2015-04-13 DIAGNOSIS — I5033 Acute on chronic diastolic (congestive) heart failure: Secondary | ICD-10-CM | POA: Diagnosis not present

## 2015-04-13 DIAGNOSIS — I739 Peripheral vascular disease, unspecified: Secondary | ICD-10-CM | POA: Diagnosis not present

## 2015-04-13 DIAGNOSIS — I4891 Unspecified atrial fibrillation: Secondary | ICD-10-CM | POA: Diagnosis not present

## 2015-04-16 DIAGNOSIS — I4891 Unspecified atrial fibrillation: Secondary | ICD-10-CM | POA: Diagnosis not present

## 2015-04-16 DIAGNOSIS — I5033 Acute on chronic diastolic (congestive) heart failure: Secondary | ICD-10-CM | POA: Diagnosis not present

## 2015-04-16 DIAGNOSIS — I871 Compression of vein: Secondary | ICD-10-CM | POA: Diagnosis not present

## 2015-04-16 DIAGNOSIS — L03115 Cellulitis of right lower limb: Secondary | ICD-10-CM | POA: Diagnosis not present

## 2015-04-16 DIAGNOSIS — I872 Venous insufficiency (chronic) (peripheral): Secondary | ICD-10-CM | POA: Diagnosis not present

## 2015-04-16 DIAGNOSIS — I739 Peripheral vascular disease, unspecified: Secondary | ICD-10-CM | POA: Diagnosis not present

## 2015-04-18 DIAGNOSIS — I4891 Unspecified atrial fibrillation: Secondary | ICD-10-CM | POA: Diagnosis not present

## 2015-04-18 DIAGNOSIS — I5033 Acute on chronic diastolic (congestive) heart failure: Secondary | ICD-10-CM | POA: Diagnosis not present

## 2015-04-18 DIAGNOSIS — L03115 Cellulitis of right lower limb: Secondary | ICD-10-CM | POA: Diagnosis not present

## 2015-04-18 DIAGNOSIS — I872 Venous insufficiency (chronic) (peripheral): Secondary | ICD-10-CM | POA: Diagnosis not present

## 2015-04-18 DIAGNOSIS — I739 Peripheral vascular disease, unspecified: Secondary | ICD-10-CM | POA: Diagnosis not present

## 2015-04-18 DIAGNOSIS — I871 Compression of vein: Secondary | ICD-10-CM | POA: Diagnosis not present

## 2015-04-20 DIAGNOSIS — L03115 Cellulitis of right lower limb: Secondary | ICD-10-CM | POA: Diagnosis not present

## 2015-04-20 DIAGNOSIS — I5033 Acute on chronic diastolic (congestive) heart failure: Secondary | ICD-10-CM | POA: Diagnosis not present

## 2015-04-20 DIAGNOSIS — I739 Peripheral vascular disease, unspecified: Secondary | ICD-10-CM | POA: Diagnosis not present

## 2015-04-20 DIAGNOSIS — I871 Compression of vein: Secondary | ICD-10-CM | POA: Diagnosis not present

## 2015-04-20 DIAGNOSIS — I872 Venous insufficiency (chronic) (peripheral): Secondary | ICD-10-CM | POA: Diagnosis not present

## 2015-04-20 DIAGNOSIS — I4891 Unspecified atrial fibrillation: Secondary | ICD-10-CM | POA: Diagnosis not present

## 2015-04-23 DIAGNOSIS — I4891 Unspecified atrial fibrillation: Secondary | ICD-10-CM | POA: Diagnosis not present

## 2015-04-23 DIAGNOSIS — L03115 Cellulitis of right lower limb: Secondary | ICD-10-CM | POA: Diagnosis not present

## 2015-04-23 DIAGNOSIS — I872 Venous insufficiency (chronic) (peripheral): Secondary | ICD-10-CM | POA: Diagnosis not present

## 2015-04-23 DIAGNOSIS — I871 Compression of vein: Secondary | ICD-10-CM | POA: Diagnosis not present

## 2015-04-23 DIAGNOSIS — I739 Peripheral vascular disease, unspecified: Secondary | ICD-10-CM | POA: Diagnosis not present

## 2015-04-23 DIAGNOSIS — I5033 Acute on chronic diastolic (congestive) heart failure: Secondary | ICD-10-CM | POA: Diagnosis not present

## 2015-04-25 DIAGNOSIS — I739 Peripheral vascular disease, unspecified: Secondary | ICD-10-CM | POA: Diagnosis not present

## 2015-04-25 DIAGNOSIS — I872 Venous insufficiency (chronic) (peripheral): Secondary | ICD-10-CM | POA: Diagnosis not present

## 2015-04-25 DIAGNOSIS — I4891 Unspecified atrial fibrillation: Secondary | ICD-10-CM | POA: Diagnosis not present

## 2015-04-25 DIAGNOSIS — I871 Compression of vein: Secondary | ICD-10-CM | POA: Diagnosis not present

## 2015-04-25 DIAGNOSIS — L03115 Cellulitis of right lower limb: Secondary | ICD-10-CM | POA: Diagnosis not present

## 2015-04-25 DIAGNOSIS — I5033 Acute on chronic diastolic (congestive) heart failure: Secondary | ICD-10-CM | POA: Diagnosis not present

## 2015-04-27 DIAGNOSIS — I872 Venous insufficiency (chronic) (peripheral): Secondary | ICD-10-CM | POA: Diagnosis not present

## 2015-04-27 DIAGNOSIS — I5033 Acute on chronic diastolic (congestive) heart failure: Secondary | ICD-10-CM | POA: Diagnosis not present

## 2015-04-27 DIAGNOSIS — I871 Compression of vein: Secondary | ICD-10-CM | POA: Diagnosis not present

## 2015-04-27 DIAGNOSIS — L03115 Cellulitis of right lower limb: Secondary | ICD-10-CM | POA: Diagnosis not present

## 2015-04-27 DIAGNOSIS — I4891 Unspecified atrial fibrillation: Secondary | ICD-10-CM | POA: Diagnosis not present

## 2015-04-27 DIAGNOSIS — I739 Peripheral vascular disease, unspecified: Secondary | ICD-10-CM | POA: Diagnosis not present

## 2015-04-30 DIAGNOSIS — I871 Compression of vein: Secondary | ICD-10-CM | POA: Diagnosis not present

## 2015-04-30 DIAGNOSIS — I872 Venous insufficiency (chronic) (peripheral): Secondary | ICD-10-CM | POA: Diagnosis not present

## 2015-04-30 DIAGNOSIS — L03115 Cellulitis of right lower limb: Secondary | ICD-10-CM | POA: Diagnosis not present

## 2015-04-30 DIAGNOSIS — I739 Peripheral vascular disease, unspecified: Secondary | ICD-10-CM | POA: Diagnosis not present

## 2015-04-30 DIAGNOSIS — I5033 Acute on chronic diastolic (congestive) heart failure: Secondary | ICD-10-CM | POA: Diagnosis not present

## 2015-04-30 DIAGNOSIS — I4891 Unspecified atrial fibrillation: Secondary | ICD-10-CM | POA: Diagnosis not present

## 2015-05-02 DIAGNOSIS — I871 Compression of vein: Secondary | ICD-10-CM | POA: Diagnosis not present

## 2015-05-02 DIAGNOSIS — I739 Peripheral vascular disease, unspecified: Secondary | ICD-10-CM | POA: Diagnosis not present

## 2015-05-02 DIAGNOSIS — I4891 Unspecified atrial fibrillation: Secondary | ICD-10-CM | POA: Diagnosis not present

## 2015-05-02 DIAGNOSIS — I872 Venous insufficiency (chronic) (peripheral): Secondary | ICD-10-CM | POA: Diagnosis not present

## 2015-05-02 DIAGNOSIS — I5033 Acute on chronic diastolic (congestive) heart failure: Secondary | ICD-10-CM | POA: Diagnosis not present

## 2015-05-02 DIAGNOSIS — L03115 Cellulitis of right lower limb: Secondary | ICD-10-CM | POA: Diagnosis not present

## 2015-05-04 ENCOUNTER — Inpatient Hospital Stay (HOSPITAL_COMMUNITY): Admission: RE | Admit: 2015-05-04 | Payer: Commercial Managed Care - HMO | Source: Ambulatory Visit

## 2015-05-04 DIAGNOSIS — I4891 Unspecified atrial fibrillation: Secondary | ICD-10-CM | POA: Diagnosis not present

## 2015-05-04 DIAGNOSIS — I871 Compression of vein: Secondary | ICD-10-CM | POA: Diagnosis not present

## 2015-05-04 DIAGNOSIS — I739 Peripheral vascular disease, unspecified: Secondary | ICD-10-CM | POA: Diagnosis not present

## 2015-05-04 DIAGNOSIS — L03115 Cellulitis of right lower limb: Secondary | ICD-10-CM | POA: Diagnosis not present

## 2015-05-04 DIAGNOSIS — I5033 Acute on chronic diastolic (congestive) heart failure: Secondary | ICD-10-CM | POA: Diagnosis not present

## 2015-05-04 DIAGNOSIS — I872 Venous insufficiency (chronic) (peripheral): Secondary | ICD-10-CM | POA: Diagnosis not present

## 2015-05-07 DIAGNOSIS — I872 Venous insufficiency (chronic) (peripheral): Secondary | ICD-10-CM | POA: Diagnosis not present

## 2015-05-07 DIAGNOSIS — I4891 Unspecified atrial fibrillation: Secondary | ICD-10-CM | POA: Diagnosis not present

## 2015-05-07 DIAGNOSIS — I871 Compression of vein: Secondary | ICD-10-CM | POA: Diagnosis not present

## 2015-05-07 DIAGNOSIS — I5033 Acute on chronic diastolic (congestive) heart failure: Secondary | ICD-10-CM | POA: Diagnosis not present

## 2015-05-07 DIAGNOSIS — L03115 Cellulitis of right lower limb: Secondary | ICD-10-CM | POA: Diagnosis not present

## 2015-05-07 DIAGNOSIS — I739 Peripheral vascular disease, unspecified: Secondary | ICD-10-CM | POA: Diagnosis not present

## 2015-05-09 DIAGNOSIS — I4891 Unspecified atrial fibrillation: Secondary | ICD-10-CM | POA: Diagnosis not present

## 2015-05-09 DIAGNOSIS — I5033 Acute on chronic diastolic (congestive) heart failure: Secondary | ICD-10-CM | POA: Diagnosis not present

## 2015-05-09 DIAGNOSIS — I871 Compression of vein: Secondary | ICD-10-CM | POA: Diagnosis not present

## 2015-05-09 DIAGNOSIS — I739 Peripheral vascular disease, unspecified: Secondary | ICD-10-CM | POA: Diagnosis not present

## 2015-05-09 DIAGNOSIS — I872 Venous insufficiency (chronic) (peripheral): Secondary | ICD-10-CM | POA: Diagnosis not present

## 2015-05-09 DIAGNOSIS — L03115 Cellulitis of right lower limb: Secondary | ICD-10-CM | POA: Diagnosis not present

## 2015-05-11 DIAGNOSIS — I5033 Acute on chronic diastolic (congestive) heart failure: Secondary | ICD-10-CM | POA: Diagnosis not present

## 2015-05-11 DIAGNOSIS — I739 Peripheral vascular disease, unspecified: Secondary | ICD-10-CM | POA: Diagnosis not present

## 2015-05-11 DIAGNOSIS — I4891 Unspecified atrial fibrillation: Secondary | ICD-10-CM | POA: Diagnosis not present

## 2015-05-11 DIAGNOSIS — L03115 Cellulitis of right lower limb: Secondary | ICD-10-CM | POA: Diagnosis not present

## 2015-05-11 DIAGNOSIS — I871 Compression of vein: Secondary | ICD-10-CM | POA: Diagnosis not present

## 2015-05-11 DIAGNOSIS — I872 Venous insufficiency (chronic) (peripheral): Secondary | ICD-10-CM | POA: Diagnosis not present

## 2015-05-14 DIAGNOSIS — I739 Peripheral vascular disease, unspecified: Secondary | ICD-10-CM | POA: Diagnosis not present

## 2015-05-14 DIAGNOSIS — L03115 Cellulitis of right lower limb: Secondary | ICD-10-CM | POA: Diagnosis not present

## 2015-05-14 DIAGNOSIS — I4891 Unspecified atrial fibrillation: Secondary | ICD-10-CM | POA: Diagnosis not present

## 2015-05-14 DIAGNOSIS — I5033 Acute on chronic diastolic (congestive) heart failure: Secondary | ICD-10-CM | POA: Diagnosis not present

## 2015-05-14 DIAGNOSIS — I872 Venous insufficiency (chronic) (peripheral): Secondary | ICD-10-CM | POA: Diagnosis not present

## 2015-05-14 DIAGNOSIS — I871 Compression of vein: Secondary | ICD-10-CM | POA: Diagnosis not present

## 2015-05-16 DIAGNOSIS — I4891 Unspecified atrial fibrillation: Secondary | ICD-10-CM | POA: Diagnosis not present

## 2015-05-16 DIAGNOSIS — I739 Peripheral vascular disease, unspecified: Secondary | ICD-10-CM | POA: Diagnosis not present

## 2015-05-16 DIAGNOSIS — L03115 Cellulitis of right lower limb: Secondary | ICD-10-CM | POA: Diagnosis not present

## 2015-05-16 DIAGNOSIS — I872 Venous insufficiency (chronic) (peripheral): Secondary | ICD-10-CM | POA: Diagnosis not present

## 2015-05-16 DIAGNOSIS — I871 Compression of vein: Secondary | ICD-10-CM | POA: Diagnosis not present

## 2015-05-16 DIAGNOSIS — I5033 Acute on chronic diastolic (congestive) heart failure: Secondary | ICD-10-CM | POA: Diagnosis not present

## 2015-05-30 DIAGNOSIS — G3184 Mild cognitive impairment, so stated: Secondary | ICD-10-CM | POA: Diagnosis not present

## 2015-06-06 DIAGNOSIS — I10 Essential (primary) hypertension: Secondary | ICD-10-CM | POA: Diagnosis not present

## 2015-06-06 DIAGNOSIS — E780 Pure hypercholesterolemia: Secondary | ICD-10-CM | POA: Diagnosis not present

## 2015-06-06 DIAGNOSIS — M109 Gout, unspecified: Secondary | ICD-10-CM | POA: Diagnosis not present

## 2015-06-06 DIAGNOSIS — E538 Deficiency of other specified B group vitamins: Secondary | ICD-10-CM | POA: Diagnosis not present

## 2015-06-06 DIAGNOSIS — I4891 Unspecified atrial fibrillation: Secondary | ICD-10-CM | POA: Diagnosis not present

## 2015-06-06 DIAGNOSIS — I509 Heart failure, unspecified: Secondary | ICD-10-CM | POA: Diagnosis not present

## 2015-06-06 DIAGNOSIS — I5023 Acute on chronic systolic (congestive) heart failure: Secondary | ICD-10-CM | POA: Diagnosis not present

## 2015-07-08 ENCOUNTER — Inpatient Hospital Stay (HOSPITAL_COMMUNITY)
Admission: EM | Admit: 2015-07-08 | Discharge: 2015-07-18 | DRG: 292 | Disposition: A | Discharge status: Home Health | Payer: Commercial Managed Care - HMO | Attending: Internal Medicine | Admitting: Internal Medicine

## 2015-07-08 ENCOUNTER — Encounter (HOSPITAL_COMMUNITY): Payer: Self-pay

## 2015-07-08 ENCOUNTER — Emergency Department (HOSPITAL_COMMUNITY): Payer: Commercial Managed Care - HMO

## 2015-07-08 DIAGNOSIS — N179 Acute kidney failure, unspecified: Secondary | ICD-10-CM | POA: Diagnosis not present

## 2015-07-08 DIAGNOSIS — I129 Hypertensive chronic kidney disease with stage 1 through stage 4 chronic kidney disease, or unspecified chronic kidney disease: Secondary | ICD-10-CM | POA: Diagnosis present

## 2015-07-08 DIAGNOSIS — E871 Hypo-osmolality and hyponatremia: Secondary | ICD-10-CM | POA: Diagnosis present

## 2015-07-08 DIAGNOSIS — D638 Anemia in other chronic diseases classified elsewhere: Secondary | ICD-10-CM | POA: Diagnosis present

## 2015-07-08 DIAGNOSIS — Z888 Allergy status to other drugs, medicaments and biological substances status: Secondary | ICD-10-CM | POA: Diagnosis not present

## 2015-07-08 DIAGNOSIS — L03115 Cellulitis of right lower limb: Secondary | ICD-10-CM | POA: Diagnosis not present

## 2015-07-08 DIAGNOSIS — I959 Hypotension, unspecified: Secondary | ICD-10-CM | POA: Diagnosis not present

## 2015-07-08 DIAGNOSIS — Z6841 Body Mass Index (BMI) 40.0 and over, adult: Secondary | ICD-10-CM | POA: Diagnosis not present

## 2015-07-08 DIAGNOSIS — I509 Heart failure, unspecified: Secondary | ICD-10-CM | POA: Diagnosis not present

## 2015-07-08 DIAGNOSIS — E876 Hypokalemia: Secondary | ICD-10-CM | POA: Diagnosis not present

## 2015-07-08 DIAGNOSIS — I482 Chronic atrial fibrillation: Secondary | ICD-10-CM | POA: Diagnosis not present

## 2015-07-08 DIAGNOSIS — N189 Chronic kidney disease, unspecified: Secondary | ICD-10-CM | POA: Diagnosis not present

## 2015-07-08 DIAGNOSIS — T502X5A Adverse effect of carbonic-anhydrase inhibitors, benzothiadiazides and other diuretics, initial encounter: Secondary | ICD-10-CM | POA: Diagnosis present

## 2015-07-08 DIAGNOSIS — R32 Unspecified urinary incontinence: Secondary | ICD-10-CM | POA: Diagnosis present

## 2015-07-08 DIAGNOSIS — D649 Anemia, unspecified: Secondary | ICD-10-CM | POA: Diagnosis not present

## 2015-07-08 DIAGNOSIS — Z7901 Long term (current) use of anticoagulants: Secondary | ICD-10-CM | POA: Diagnosis not present

## 2015-07-08 DIAGNOSIS — I5033 Acute on chronic diastolic (congestive) heart failure: Principal | ICD-10-CM | POA: Diagnosis present

## 2015-07-08 DIAGNOSIS — L03116 Cellulitis of left lower limb: Secondary | ICD-10-CM | POA: Diagnosis not present

## 2015-07-08 DIAGNOSIS — Z8249 Family history of ischemic heart disease and other diseases of the circulatory system: Secondary | ICD-10-CM | POA: Diagnosis not present

## 2015-07-08 DIAGNOSIS — I517 Cardiomegaly: Secondary | ICD-10-CM | POA: Diagnosis not present

## 2015-07-08 DIAGNOSIS — N183 Chronic kidney disease, stage 3 (moderate): Secondary | ICD-10-CM | POA: Diagnosis present

## 2015-07-08 DIAGNOSIS — M109 Gout, unspecified: Secondary | ICD-10-CM | POA: Diagnosis present

## 2015-07-08 DIAGNOSIS — Z87891 Personal history of nicotine dependence: Secondary | ICD-10-CM | POA: Diagnosis not present

## 2015-07-08 DIAGNOSIS — I10 Essential (primary) hypertension: Secondary | ICD-10-CM | POA: Diagnosis not present

## 2015-07-08 DIAGNOSIS — J811 Chronic pulmonary edema: Secondary | ICD-10-CM | POA: Diagnosis not present

## 2015-07-08 LAB — COMPREHENSIVE METABOLIC PANEL
ALT: 9 U/L — ABNORMAL LOW (ref 17–63)
ANION GAP: 10 (ref 5–15)
AST: 12 U/L — AB (ref 15–41)
Albumin: 3.3 g/dL — ABNORMAL LOW (ref 3.5–5.0)
Alkaline Phosphatase: 158 U/L — ABNORMAL HIGH (ref 38–126)
BILIRUBIN TOTAL: 0.8 mg/dL (ref 0.3–1.2)
BUN: 17 mg/dL (ref 6–20)
CHLORIDE: 93 mmol/L — AB (ref 101–111)
CO2: 24 mmol/L (ref 22–32)
CREATININE: 1.22 mg/dL (ref 0.61–1.24)
Calcium: 8.8 mg/dL — ABNORMAL LOW (ref 8.9–10.3)
GFR calc Af Amer: >60 mL/min (ref >60)
GFR calc non Af Amer: 59 mL/min — ABNORMAL LOW (ref >60)
GLUCOSE: 143 mg/dL — AB (ref 65–99)
Potassium: 4.4 mmol/L (ref 3.5–5.1)
Sodium: 127 mmol/L — ABNORMAL LOW (ref 135–145)
Total Protein: 6.5 g/dL (ref 6.5–8.1)

## 2015-07-08 LAB — CBC WITH DIFFERENTIAL/PLATELET
BASOS ABS: 0 10*3/uL (ref 0.0–0.1)
Basophils Relative: 0 % (ref 0–1)
Eosinophils Absolute: 0.6 10*3/uL (ref 0.0–0.7)
Eosinophils Relative: 8 % — ABNORMAL HIGH (ref 0–5)
HCT: 33.3 % — ABNORMAL LOW (ref 39.0–52.0)
Hemoglobin: 11.1 g/dL — ABNORMAL LOW (ref 13.0–17.0)
LYMPHS ABS: 0.4 10*3/uL — AB (ref 0.7–4.0)
LYMPHS PCT: 5 % — AB (ref 12–46)
MCH: 28.6 pg (ref 26.0–34.0)
MCHC: 33.3 g/dL (ref 30.0–36.0)
MCV: 85.8 fL (ref 78.0–100.0)
Monocytes Absolute: 0.9 10*3/uL (ref 0.1–1.0)
Monocytes Relative: 11 % (ref 3–12)
NEUTROS PCT: 76 % (ref 43–77)
Neutro Abs: 6.2 10*3/uL (ref 1.7–7.7)
PLATELETS: 291 10*3/uL (ref 150–400)
RBC: 3.88 MIL/uL — ABNORMAL LOW (ref 4.22–5.81)
RDW: 14.3 % (ref 11.5–15.5)
WBC: 8.2 10*3/uL (ref 4.0–10.5)

## 2015-07-08 LAB — BRAIN NATRIURETIC PEPTIDE: B Natriuretic Peptide: 222.1 pg/mL — ABNORMAL HIGH (ref 0.0–100.0)

## 2015-07-08 MED ORDER — ONDANSETRON HCL 4 MG/2ML IJ SOLN
4.0000 mg | Freq: Once | INTRAMUSCULAR | Status: AC
  Administered 2015-07-08: 4 mg via INTRAVENOUS
  Filled 2015-07-08: qty 2

## 2015-07-08 MED ORDER — FUROSEMIDE 10 MG/ML IJ SOLN
80.0000 mg | Freq: Once | INTRAMUSCULAR | Status: AC
  Administered 2015-07-08: 80 mg via INTRAVENOUS
  Filled 2015-07-08: qty 8

## 2015-07-08 MED ORDER — MORPHINE SULFATE 4 MG/ML IJ SOLN
4.0000 mg | Freq: Once | INTRAMUSCULAR | Status: AC
  Administered 2015-07-08: 4 mg via INTRAVENOUS
  Filled 2015-07-08: qty 1

## 2015-07-08 NOTE — ED Notes (Signed)
Pt reports bilateral lower extremity swelling. Lower extremities appear very red, swollen, and draining serous fluids. Denies CP, SOB. He reports this is a recurrent problem for him.

## 2015-07-08 NOTE — ED Notes (Signed)
Per daughter, pt has not been taking diuretic as prescribed.

## 2015-07-08 NOTE — ED Notes (Signed)
Pt c/o bilateral lower limb edema x 4 days. Reports having this happen a year ago and spends several days in the hospital for diuresis. Bilateral legs red, with crusted areas, and clear drainage. Denies shortness of breath or chest pain

## 2015-07-08 NOTE — ED Provider Notes (Signed)
CSN: [id number]     Arrival date & time 07/08/15  1916 History   First MD Initiated Contact with Patient 07/08/15 2053     Chief Complaint  Patient presents with  . Leg Swelling     (Consider location/radiation/quality/duration/timing/severity/associated sxs/prior Treatment) HPI Comments: Pt c/o bilateral lower limb edema x 4 days. Reports having this happen a year ago and spends several days in the hospital for diuresis. Bilateral legs red, with crusted areas, and clear drainage. Denies shortness of breath or chest pain. States he has been taking his Torsemide as prescribed. Patient's dry weight is 295lbs normally.   Patient is a 69 y.o. male presenting with leg pain. The history is provided by the patient.  Leg Pain Location:  Leg Time since incident:  1 week Leg location:  L lower leg and R lower leg Pain details:    Radiates to:  Does not radiate Chronicity:  Recurrent Associated symptoms: swelling   Associated symptoms: no fever     Past Medical History  Diagnosis Date  . Hypertension   . Morbid obesity   . H/O: GI bleed     "cause I took too much aspirin"  . Venous stasis ulcer   . Atrial fibrillation     a. chronic   . Edema of lower extremity   . Chronic diastolic CHF (congestive heart failure) 12/2002    a. EF 50-55% by echo 04/2012  . Gout   . Hyperglycemia     Noted 05/2012   Past Surgical History  Procedure Laterality Date  . No past surgeries     Family History  Problem Relation Age of Onset  . Arrhythmia Father   . Hypertension Father   . Cancer Mother     cervical   History  Substance Use Topics  . Smoking status: Former Smoker    Types: Cigarettes    Quit date: 12/08/1965  . Smokeless tobacco: Never Used     Comment: "social cigarette smoker in my late teens"  . Alcohol Use: 8.4 oz/week    14 Cans of beer per week    Review of Systems  Constitutional: Negative for fever.       + weight gain  Respiratory: Positive for cough.    Cardiovascular: Positive for leg swelling.  Genitourinary: Positive for scrotal swelling.  All other systems reviewed and are negative.     Allergies  Amlodipine besylate  Home Medications   Prior to Admission medications   Medication Sig Start Date End Date Taking? Authorizing Provider  amLODipine (NORVASC) 5 MG tablet Take 5 mg by mouth daily.   Yes Historical Provider, MD  apixaban (ELIQUIS) 5 MG TABS tablet Take 1 tablet (5 mg total) by mouth 2 (two) times daily. 01/12/15  Yes Jerald Kief, MD  atorvastatin (LIPITOR) 80 MG tablet Take 80 mg by mouth daily.   Yes Historical Provider, MD  carvedilol (COREG) 12.5 MG tablet Take 1.5 tablets (18.75 mg total) by mouth 2 (two) times daily with a meal. 04/02/15  Yes Laurey Morale, MD  lisinopril (PRINIVIL,ZESTRIL) 40 MG tablet Take 40 mg by mouth daily.   Yes Historical Provider, MD  pantoprazole (PROTONIX) 40 MG tablet Take 40 mg by mouth 2 (two) times daily before a meal.   Yes Historical Provider, MD  potassium chloride SA (K-DUR,KLOR-CON) 20 MEQ tablet Take 2 tablets (40 mEq total) by mouth daily. Patient taking differently: Take 40 mEq by mouth 2 (two) times daily.  04/02/15  Yes Dalton  Alford Highland, MD  torsemide (DEMADEX) 20 MG tablet Take 3 tablets (60 mg total) by mouth 2 (two) times daily. Take additional 20mg  tablet once daily for weight 296lb or greater. 04/02/15  Yes Laurey Morale, MD  colchicine 0.6 MG tablet Take 1 tablet (0.6 mg total) by mouth daily. 06/11/12 04/02/15  Dayna N Dunn, [name]  spironolactone (ALDACTONE) 25 MG tablet Take 1 tablet (25 mg total) by mouth daily. 01/12/15   Jerald Kief, MD   BP 123/58 mmHg  Pulse 65  Temp(Src) 99 F (37.2 C) (Oral)  Resp 18  Ht 5\' 10"  (1.778 m)  Wt 301 lb 8 oz (136.76 kg)  BMI 43.26 kg/m2  SpO2 99% Physical Exam  Constitutional: He is oriented to person, place, and time. He appears well-developed and well-nourished.  HENT:  Head: Normocephalic and atraumatic.  Eyes:  Conjunctivae are normal.  Neck: Neck supple.  Cardiovascular: Normal rate, normal heart sounds and intact distal pulses.  An irregularly irregular rhythm present.  Pulmonary/Chest: Effort normal. He has decreased breath sounds.  Diminished breath sounds at baseline  Abdominal: Soft.  Genitourinary:  Scrotal swelling w/o tenderness, erythema, or warmth  Musculoskeletal: He exhibits edema (2+ bilateral LE, erythematous w/o warmth, clear drainage sensation intact. TTP chronic skin changes).  Neurological: He is alert and oriented to person, place, and time.  Skin: Skin is warm and dry.  Nursing note and vitals reviewed.   ED Course  Procedures (including critical care time) Medications  furosemide (LASIX) injection 80 mg (80 mg Intravenous Given 07/08/15 2143)  morphine 4 MG/ML injection 4 mg (4 mg Intravenous Given 07/08/15 2313)  ondansetron (ZOFRAN) injection 4 mg (4 mg Intravenous Given 07/08/15 2313)    Labs Review Labs Reviewed  CBC WITH DIFFERENTIAL/PLATELET - Abnormal; Notable for the following:    RBC 3.88 (*)    Hemoglobin 11.1 (*)    HCT 33.3 (*)    Lymphocytes Relative 5 (*)    Lymphs Abs 0.4 (*)    Eosinophils Relative 8 (*)    All other components within normal limits  COMPREHENSIVE METABOLIC PANEL - Abnormal; Notable for the following:    Sodium 127 (*)    Chloride 93 (*)    Glucose, Bld 143 (*)    Calcium 8.8 (*)    Albumin 3.3 (*)    AST 12 (*)    ALT 9 (*)    Alkaline Phosphatase 158 (*)    GFR calc non Af Amer 59 (*)    All other components within normal limits  BRAIN NATRIURETIC PEPTIDE - Abnormal; Notable for the following:    B Natriuretic Peptide 222.1 (*)    All other components within normal limits    Imaging Review Dg Chest 2 View  07/08/2015   CLINICAL DATA:  Bilateral lower extremity swelling for 3 days.  EXAM: CHEST  2 VIEW  COMPARISON:  01/04/2015  FINDINGS: There is marked cardiomegaly, unchanged. There is mild vascular congestion, unchanged.  No alveolar edema or infiltrate. No large effusion.  IMPRESSION: Unchanged cardiomegaly. Mild vascular congestion, also unchanged and possibly chronic.   Electronically Signed   By: Ellery Plunk M.D.   On: 07/08/2015 21:37     EKG Interpretation   Date/Time:  Sunday July 08 2015 22:48:11 EDT Ventricular Rate:  74 PR Interval:    QRS Duration: 110 QT Interval:  416 QTC Calculation: 461 R Axis:   10 Text Interpretation:  Atrial fibrillation Abnormal R-wave progression,  late transition Inferior infarct, old  Baseline wander in lead(s) V2 No  significant change was found Confirmed by [facility]  MD, TREY ([id number]) on  07/08/2015 10:57:58 PM      MDM   Final diagnoses:  CHF exacerbation    Filed Vitals:   07/08/15 2230  BP: 123/58  Pulse: 65  Temp:   Resp: 18   I have reviewed nursing notes, vital signs, and all lab and all imaging results as noted above.  Patient with CHF exacerbation. Bilateral LE edema noted. Diminished breath sounds at bases. BNP elevated. CXR with pulmonary vascular congestion. IV Lasix ordered. Will admit for further management. Patient d/w with Dr. Loretha Stapler, agrees with plan.    Francee Piccolo, [name] 07/09/15 0001  Blake Divine, MD 07/12/15 [phone]

## 2015-07-08 NOTE — ED Notes (Signed)
Pt given urinal. Scrotum elevated off bed per request.

## 2015-07-09 ENCOUNTER — Encounter (HOSPITAL_COMMUNITY): Payer: Self-pay | Admitting: Internal Medicine

## 2015-07-09 DIAGNOSIS — I10 Essential (primary) hypertension: Secondary | ICD-10-CM

## 2015-07-09 DIAGNOSIS — I5033 Acute on chronic diastolic (congestive) heart failure: Principal | ICD-10-CM

## 2015-07-09 DIAGNOSIS — I482 Chronic atrial fibrillation: Secondary | ICD-10-CM

## 2015-07-09 LAB — COMPREHENSIVE METABOLIC PANEL
ALT: 8 U/L — ABNORMAL LOW (ref 17–63)
AST: 12 U/L — ABNORMAL LOW (ref 15–41)
Albumin: 3.3 g/dL — ABNORMAL LOW (ref 3.5–5.0)
Alkaline Phosphatase: 150 U/L — ABNORMAL HIGH (ref 38–126)
Anion gap: 8 (ref 5–15)
BILIRUBIN TOTAL: 0.9 mg/dL (ref 0.3–1.2)
BUN: 17 mg/dL (ref 6–20)
CALCIUM: 8.8 mg/dL — AB (ref 8.9–10.3)
CO2: 26 mmol/L (ref 22–32)
CREATININE: 1.22 mg/dL (ref 0.61–1.24)
Chloride: 95 mmol/L — ABNORMAL LOW (ref 101–111)
GFR calc Af Amer: >60 mL/min (ref >60)
GFR, EST NON AFRICAN AMERICAN: 59 mL/min — AB (ref >60)
Glucose, Bld: 143 mg/dL — ABNORMAL HIGH (ref 65–99)
Potassium: 4.2 mmol/L (ref 3.5–5.1)
SODIUM: 129 mmol/L — AB (ref 135–145)
Total Protein: 6.7 g/dL (ref 6.5–8.1)

## 2015-07-09 LAB — CBC WITH DIFFERENTIAL/PLATELET
BASOS ABS: 0 10*3/uL (ref 0.0–0.1)
BASOS PCT: 0 % (ref 0–1)
Eosinophils Absolute: 0.8 10*3/uL — ABNORMAL HIGH (ref 0.0–0.7)
Eosinophils Relative: 8 % — ABNORMAL HIGH (ref 0–5)
HCT: 33.1 % — ABNORMAL LOW (ref 39.0–52.0)
Hemoglobin: 10.7 g/dL — ABNORMAL LOW (ref 13.0–17.0)
LYMPHS ABS: 0.6 10*3/uL — AB (ref 0.7–4.0)
Lymphocytes Relative: 6 % — ABNORMAL LOW (ref 12–46)
MCH: 28.4 pg (ref 26.0–34.0)
MCHC: 32.3 g/dL (ref 30.0–36.0)
MCV: 87.8 fL (ref 78.0–100.0)
Monocytes Absolute: 1.2 10*3/uL — ABNORMAL HIGH (ref 0.1–1.0)
Monocytes Relative: 12 % (ref 3–12)
Neutro Abs: 7.1 10*3/uL (ref 1.7–7.7)
Neutrophils Relative %: 74 % (ref 43–77)
PLATELETS: 300 10*3/uL (ref 150–400)
RBC: 3.77 MIL/uL — AB (ref 4.22–5.81)
RDW: 14.5 % (ref 11.5–15.5)
WBC: 9.7 10*3/uL (ref 4.0–10.5)

## 2015-07-09 LAB — TSH: TSH: 2.082 u[IU]/mL (ref 0.350–4.500)

## 2015-07-09 MED ORDER — FUROSEMIDE 10 MG/ML IJ SOLN
80.0000 mg | Freq: Four times a day (QID) | INTRAMUSCULAR | Status: DC
  Administered 2015-07-09 (×2): 80 mg via INTRAVENOUS
  Filled 2015-07-09 (×5): qty 8

## 2015-07-09 MED ORDER — AMLODIPINE BESYLATE 5 MG PO TABS
5.0000 mg | ORAL_TABLET | Freq: Every day | ORAL | Status: DC
  Administered 2015-07-09: 5 mg via ORAL
  Filled 2015-07-09 (×2): qty 1

## 2015-07-09 MED ORDER — DOXYCYCLINE HYCLATE 100 MG PO TABS
100.0000 mg | ORAL_TABLET | Freq: Two times a day (BID) | ORAL | Status: DC
  Filled 2015-07-09 (×2): qty 1

## 2015-07-09 MED ORDER — LISINOPRIL 40 MG PO TABS
40.0000 mg | ORAL_TABLET | Freq: Every day | ORAL | Status: DC
  Administered 2015-07-09: 40 mg via ORAL
  Filled 2015-07-09 (×2): qty 1

## 2015-07-09 MED ORDER — ACETAMINOPHEN 325 MG PO TABS
650.0000 mg | ORAL_TABLET | Freq: Four times a day (QID) | ORAL | Status: DC | PRN
  Administered 2015-07-09: 650 mg via ORAL
  Filled 2015-07-09: qty 2

## 2015-07-09 MED ORDER — DOXYCYCLINE HYCLATE 100 MG PO TABS
100.0000 mg | ORAL_TABLET | Freq: Two times a day (BID) | ORAL | Status: DC
  Administered 2015-07-09 – 2015-07-18 (×18): 100 mg via ORAL
  Filled 2015-07-09 (×19): qty 1

## 2015-07-09 MED ORDER — POTASSIUM CHLORIDE CRYS ER 20 MEQ PO TBCR
40.0000 meq | EXTENDED_RELEASE_TABLET | Freq: Two times a day (BID) | ORAL | Status: DC
  Administered 2015-07-09 – 2015-07-17 (×18): 40 meq via ORAL
  Filled 2015-07-09 (×20): qty 2

## 2015-07-09 MED ORDER — APIXABAN 5 MG PO TABS
5.0000 mg | ORAL_TABLET | Freq: Two times a day (BID) | ORAL | Status: DC
  Administered 2015-07-09 – 2015-07-18 (×20): 5 mg via ORAL
  Filled 2015-07-09 (×21): qty 1

## 2015-07-09 MED ORDER — DOXYCYCLINE HYCLATE 100 MG IV SOLR
100.0000 mg | Freq: Two times a day (BID) | INTRAVENOUS | Status: DC
  Administered 2015-07-09 (×2): 100 mg via INTRAVENOUS
  Filled 2015-07-09 (×3): qty 100

## 2015-07-09 MED ORDER — ACETAMINOPHEN 650 MG RE SUPP: 650.0000 mg | Freq: Four times a day (QID) | RECTAL | Status: DC | PRN

## 2015-07-09 MED ORDER — FUROSEMIDE 10 MG/ML IJ SOLN
15.0000 mg/h | INTRAMUSCULAR | Status: DC
  Administered 2015-07-09 – 2015-07-10 (×2): 15 mg/h via INTRAVENOUS
  Filled 2015-07-09 (×5): qty 25

## 2015-07-09 MED ORDER — SACCHAROMYCES BOULARDII 250 MG PO CAPS
250.0000 mg | ORAL_CAPSULE | Freq: Two times a day (BID) | ORAL | Status: DC
  Administered 2015-07-09 – 2015-07-18 (×20): 250 mg via ORAL
  Filled 2015-07-09 (×21): qty 1

## 2015-07-09 MED ORDER — CARVEDILOL 6.25 MG PO TABS
18.7500 mg | ORAL_TABLET | Freq: Two times a day (BID) | ORAL | Status: DC
  Administered 2015-07-09 – 2015-07-10 (×2): 18.75 mg via ORAL
  Filled 2015-07-09 (×5): qty 1

## 2015-07-09 MED ORDER — SPIRONOLACTONE 12.5 MG HALF TABLET
12.5000 mg | ORAL_TABLET | Freq: Every day | ORAL | Status: DC
  Administered 2015-07-09 – 2015-07-18 (×10): 12.5 mg via ORAL
  Filled 2015-07-09 (×10): qty 1

## 2015-07-09 MED ORDER — ATORVASTATIN CALCIUM 80 MG PO TABS
80.0000 mg | ORAL_TABLET | Freq: Every day | ORAL | Status: DC
  Administered 2015-07-09 – 2015-07-18 (×10): 80 mg via ORAL
  Filled 2015-07-09 (×10): qty 1

## 2015-07-09 MED ORDER — ONDANSETRON HCL 4 MG/2ML IJ SOLN: 4.0000 mg | Freq: Four times a day (QID) | INTRAMUSCULAR | Status: DC | PRN

## 2015-07-09 MED ORDER — SODIUM CHLORIDE 0.9 % IJ SOLN
3.0000 mL | Freq: Two times a day (BID) | INTRAMUSCULAR | Status: DC
  Administered 2015-07-09 – 2015-07-18 (×16): 3 mL via INTRAVENOUS

## 2015-07-09 MED ORDER — ONDANSETRON HCL 4 MG PO TABS: 4.0000 mg | ORAL_TABLET | Freq: Four times a day (QID) | ORAL | Status: DC | PRN

## 2015-07-09 MED ORDER — CARVEDILOL 6.25 MG PO TABS
18.7500 mg | ORAL_TABLET | Freq: Two times a day (BID) | ORAL | Status: DC
  Filled 2015-07-09: qty 1

## 2015-07-09 NOTE — Progress Notes (Signed)
Pt refusing all bed/chair alarms now. RN and NT have educated patient about importance of alarms in preventing patient falls. Pt wants to stand up from chair intermittently while on lasix drip d/t his legs getting stiff. Pt had signed fall prevention documentation upon admission early this AM. Will continue to monitor.

## 2015-07-09 NOTE — Progress Notes (Signed)
Orthopedic Tech Progress Note Patient Details:  Jesus [name] January 09, 1946 [id number]  Ortho Devices Type of Ortho Device: Roland Rack boot Ortho Device/Splint Location: bilateral Ortho Device/Splint Interventions: Application   [name] 07/09/2015, 2:02 PM

## 2015-07-09 NOTE — Patient Outreach (Signed)
[facility]) Care Management  07/09/2015  Jaxyn Rout Hanzlik 11/15/1946 [id number]   Referral from Charlesetta Shanks, RN to assign Community RN, assigned Rowe Pavy, RN.  Corrie Mckusick. Sharlee Blew [facility] Care Management [facility] CM Assistant Phone: [phone] Fax: [phone]

## 2015-07-09 NOTE — Progress Notes (Signed)
Patient seen and examined. Admitted after midnight secondary to worsening BLE swelling and scrotal edema. Patient also with associated SOB. Had hx of chronic atrial fibrillation and diastolic heart failure. Patient reported some diet indiscretion and has not been closely follow at heart failure clinic lately. Please referred to H&P written by Dr. Toniann Fail for further info/details on admission.   Plan: -IV lasix, daily weight, low sodium diet and strict intake and output -since he has not follow with HF recently and might be in needs for overall medications adjustment/titration will ask HF team to assess and help [place] with management -will follow clinical response -no chest pain  -close follow up of renal function   Jesus [name] [phone]

## 2015-07-09 NOTE — Progress Notes (Signed)
Utilization review completed. Vincient Vanaman, RN, BSN. 

## 2015-07-09 NOTE — Consult Note (Signed)
   [facility] Inpatient Consult   07/09/2015  Seann Genther Christiansen 1946/12/05 [id number] Patient was assessed for [facility] for community services. Patient was previously active with [facility].  Met with patient at bedside regarding being restarted with [facility] services. Consent form signed and folder with [facility] information given.  Patient endorses that he is still a patient of [facility] and Adron Bene, [name] is his current primary care provider.  He endorses that he had been active with [facility] for his would care until a few weeks ago.  States, "That's what happened to me is that the nurses stopped coming, my legs just kept getting worse, I tried the best I could with the supplies I had to keep the bandages on them.  I feel like I need them to come back out and work on these legs.  They're real bad now."  Patient sitting in recliner with bilateral legs weeping.  This Probation officer will notify inpatient RNCM of findings.  Of note, [facility] Care Management services does not replace or interfere with any services that are arranged by inpatient case management or social work. For additional questions or referrals please contact: Natividad Brood, RN BSN [facility] Liaison  [phone] business mobile phone

## 2015-07-09 NOTE — Consult Note (Addendum)
WOC wound consult note Reason for Consult: Consult requested for bilat leg cellulitis.  Pt states he wore bilat Una boots in the past and was followed by home health assistance and his leg edema and weeping resolved.  Home health discontinued him as a patient and his legs have declined since that time," patient states. Scrotum/lower abd pannus skin fold with generalized edema, erythremia, dry peeling scabbed skin, and mod amt yellow drainage. Wound type: Bilat legs with generalized erythremia, edema, and large amt weeping.   Measurement: Patchy areas of partial thickness wounds to bilat calves Wound bed: Red, moist woundbeds Drainage (amount, consistency, odor) Large amt yellow drainage and loose peeling skin, some odor. Dressing procedure/placement/frequency: Barrier cream to repel moisture and assist with removal of loose skin to scrotum/abd pannus. Resume previous plan of care with bilat Una boots changed 3Xweek.  Paged ortho tech to apply. Please order home health assistance after dicharge for [facility] boot changes 3X weekly.  Discussed plan of care with patient and he verbalizes understanding. Thank-you, Tobie Hellen [place], CWOCN

## 2015-07-09 NOTE — H&P (Signed)
[facility] History and Physical  Kurt Hoffmeier Karg WUJ:[id number] DOB: [birth year] DOA: 07/08/2015  Referring physician: ER physician. PCP: Miki Kins  Specialists: Dr. Jearld Pies. Cardiologist.  Chief Complaint: Lower extremity edema.  HPI: Jesus [name] is a 69 y.o. male history of chronic diastolic CHF, hypertension, atrial fibrillation presents to the ER because of worsening lower extremity edema. Patient states over the last 3-4 days patient has noticed increased swelling of the lower extremities with erythema. Denies any fever or chills. Patient also noticed worsening scrotal edema. Patient denies any pain in the scrotal area. Patient states he may have gained weight from his baseline. Patient has been admitted for decompensated CHF. Patient denies any chest pain correlated for fever or chills.   Review of Systems: As presented in the history of presenting illness, rest negative.  Past Medical History  Diagnosis Date  . Hypertension   . Morbid obesity   . H/O: GI bleed     "cause I took too much aspirin"  . Venous stasis ulcer   . Atrial fibrillation     a. chronic   . Edema of lower extremity   . Chronic diastolic CHF (congestive heart failure) 12/2002    a. EF 50-55% by echo 04/2012  . Gout   . Hyperglycemia     Noted 05/2012   Past Surgical History  Procedure Laterality Date  . No past surgeries     Social History:  reports that he quit smoking about 49 years ago. His smoking use included Cigarettes. He has never used smokeless tobacco. He reports that he drinks about 8.4 oz of alcohol per week. He reports that he does not use illicit drugs. Where does patient live home. Can patient participate in ADLs? Yes.  Allergies  Allergen Reactions  . Amlodipine Besylate Swelling    "started off low and ended up severe; if, in fact, that is what's causing the swelling" (06/03/12)    Family History:  Family History  Problem Relation Age of Onset  . Arrhythmia  Father   . Hypertension Father   . Cancer Mother     cervical      Prior to Admission medications   Medication Sig Start Date End Date Taking? Authorizing Provider  amLODipine (NORVASC) 5 MG tablet Take 5 mg by mouth daily.   Yes Historical Provider, MD  apixaban (ELIQUIS) 5 MG TABS tablet Take 1 tablet (5 mg total) by mouth 2 (two) times daily. 01/12/15  Yes Jerald Kief, MD  atorvastatin (LIPITOR) 80 MG tablet Take 80 mg by mouth daily.   Yes Historical Provider, MD  carvedilol (COREG) 12.5 MG tablet Take 1.5 tablets (18.75 mg total) by mouth 2 (two) times daily with a meal. 04/02/15  Yes Laurey Morale, MD  lisinopril (PRINIVIL,ZESTRIL) 40 MG tablet Take 40 mg by mouth daily.   Yes Historical Provider, MD  pantoprazole (PROTONIX) 40 MG tablet Take 40 mg by mouth 2 (two) times daily before a meal.   Yes Historical Provider, MD  potassium chloride SA (K-DUR,KLOR-CON) 20 MEQ tablet Take 2 tablets (40 mEq total) by mouth daily. Patient taking differently: Take 40 mEq by mouth 2 (two) times daily.  04/02/15  Yes Laurey Morale, MD  torsemide (DEMADEX) 20 MG tablet Take 3 tablets (60 mg total) by mouth 2 (two) times daily. Take additional 20mg  tablet once daily for weight 296lb or greater. 04/02/15  Yes Laurey Morale, MD  colchicine 0.6 MG tablet Take 1 tablet (0.6 mg total) by  mouth daily. 06/11/12 04/02/15  Dayna N Dunn, [name]  spironolactone (ALDACTONE) 25 MG tablet Take 1 tablet (25 mg total) by mouth daily. 01/12/15   Jerald Kief, MD    Physical Exam: Filed Vitals:   07/08/15 1923 07/08/15 2123 07/08/15 2203 07/08/15 2230  BP: 118/52  125/65 123/58  Pulse: 72  83 65  Temp: 99 F (37.2 C)     TempSrc: Oral     Resp: 18  27 18   Height: 5\' 10"  (1.778 m)     Weight: 133.811 kg (295 lb) 136.76 kg (301 lb 8 oz)    SpO2: 96%  97% 99%     General:  Moderately built and nourished.  Eyes: Anicteric. No pallor.  ENT: No discharge from the ears eyes nose or mouth.  Neck: JVD mildly  elevated. No mass felt.  Cardiovascular: S1-S2 heard.  Respiratory: No rhonchi or crepitations.  Abdomen: Soft nontender bowel sounds present. Scrotal edema.  Skin: Erythema of the right lower extremity with skin weeping.  Musculoskeletal: Bilateral lower extremity edema.  Psychiatric: Appears normal.  Neurologic: Alert awake oriented to time place and person. Moves all extremities.  Labs on Admission:  Basic Metabolic Panel:  Recent Labs Lab 07/08/15 1930  NA 127*  K 4.4  CL 93*  CO2 24  GLUCOSE 143*  BUN 17  CREATININE 1.22  CALCIUM 8.8*   Liver Function Tests:  Recent Labs Lab 07/08/15 1930  AST 12*  ALT 9*  ALKPHOS 158*  BILITOT 0.8  PROT 6.5  ALBUMIN 3.3*   No results for input(s): LIPASE, AMYLASE in the last 168 hours. No results for input(s): AMMONIA in the last 168 hours. CBC:  Recent Labs Lab 07/08/15 1930  WBC 8.2  NEUTROABS 6.2  HGB 11.1*  HCT 33.3*  MCV 85.8  PLT 291   Cardiac Enzymes: No results for input(s): CKTOTAL, CKMB, CKMBINDEX, TROPONINI in the last 168 hours.  BNP (last 3 results)  Recent Labs  03/07/15 1500 04/02/15 1244 07/08/15 1948  BNP 401.4* 223.9* 222.1*    ProBNP (last 3 results) No results for input(s): PROBNP in the last 8760 hours.  CBG: No results for input(s): GLUCAP in the last 168 hours.  Radiological Exams on Admission: Dg Chest 2 View  07/08/2015   CLINICAL DATA:  Bilateral lower extremity swelling for 3 days.  EXAM: CHEST  2 VIEW  COMPARISON:  01/04/2015  FINDINGS: There is marked cardiomegaly, unchanged. There is mild vascular congestion, unchanged. No alveolar edema or infiltrate. No large effusion.  IMPRESSION: Unchanged cardiomegaly. Mild vascular congestion, also unchanged and possibly chronic.   Electronically Signed   By: Ellery Plunk M.D.   On: 07/08/2015 21:37    EKG: Independently reviewed. Atrial fibrillation rate controlled.  Assessment/Plan Active Problems:   CHF (congestive  heart failure)   Hypertension   Acute on chronic diastolic CHF (congestive heart failure)   Chronic atrial fibrillation   Chronic anemia   1. Acute on chronic diastolic CHF last EF measured in January 2016 was 55-60% - patient has received 80 mg IV Lasix and I have placed patient on 80 mg IV every 6 hourly. Closely follow her intake output and metabolic panel and daily weights. Patient does have significant scrotal edema I have instructed the patient that if he has difficulty urinating and may need catheterization. 2. Hypertension - continue present medications. 3. Atrial fibrillation - presently rate controlled. Chads 2 vasc score is more than 2. Patient is on Apixaban and Coreg. 4.  Chronic anemia - follow CBC. 5. Since patient has erythema of the right lower extremity and also some scrotal discoloration for now I have placed patient on doxycycline. Closely observe. Patient presently denies any tenderness and there is no warmth.  I have reviewed patient's old chart labs. I have personally reviewed patient's chest x-ray and EKG.   DVT ProphylaxisApixaban.  Code Status: Full code.  Family Communication: Discussed with patient.  Disposition Plan: Admit to inpatient.    Zaeda Mcferran N. [facility] Pager [phone].  If 7PM-7AM, please contact night-coverage [email or web address] Password [email or web address] 07/09/2015, 12:40 AM

## 2015-07-09 NOTE — Consult Note (Signed)
Advanced Heart Failure Team Consult Note  Referring Physician: Dr Gwenlyn Perking  Primary Physician:Dr Cox  Primary HF Cardiologist:  Dr Shirlee Latch   Reason for Consultation: Heart Failure   HPI:   [name] is a 69 year old with history of obesity, chronic atrial fibrillation, resistant HTN, OA and diastolic CHF admitted with volume overload and suspected RLE cellulitis. HF team consulted to assist with diuretics.   He has been followed in the HF clinic and was last evaluate by Dr Shirlee Latch 4/25. Volume overloaded noted and he was instructed to take torsemide 60 mg twice a day.   He presented to [facility] ED with increased leg and scrotal edema. At baseline he is SOB with exertion but noticed increase dyspnea. Says he was taking his medications. CXR with vascular congestion noted. He has been diuresing with 80 mg IV lasix every 6 hours. Difficult to know mch urine due to incontinence.   Pertinent admission labs included: K 4.2 Creatinine 1.22 Hgb 10.7 BNP 222 TSH 2.0   FH: Father with atrial fibrillation.   SH: Divorced. Lives in [place]. Used to be an appliance repairman but now on disability. Nonsmoker.   Review of Systems: [y] = yes,  = no   General: Weight gain [Y ]; Weight loss ; Anorexia ; Fatigue [Y ]; Fever ; Chills ; Weakness [Y ]  Cardiac: Chest pain/pressure ; Resting SOB ; Exertional SOB [Y ]; Orthopnea [Y ]; Pedal Edema [Y ]; Palpitations ; Syncope ; Presyncope ; Paroxysmal nocturnal dyspnea[ ]   Pulmonary: Cough ; Wheezing[ ] ; Hemoptysis[ ] ; Sputum ; Snoring   GI: Vomiting[ ] ; Dysphagia[ ] ; Melena[ ] ; Hematochezia ; Heartburn[ ] ; Abdominal pain ; Constipation ; Diarrhea ; BRBPR   GU: Hematuria[ ] ; Dysuria ; Nocturia[ ]   Vascular: Pain in legs with walking [Y ]; Pain in feet with lying flat ; Non-healing sores [Y ]; Stroke ; TIA ; Slurred speech ;  Neuro: Headaches[ ] ; Vertigo[ ] ; Seizures[ ] ; Paresthesias[ ] ;Blurred  vision ; Diplopia ; Vision changes   Ortho/Skin: Arthritis ; Joint pain ; Muscle pain ; Joint swelling ; Back Pain ; Rash   Psych: Depression[ Y]; Anxiety[ ]   Heme: Bleeding problems ; Clotting disorders ; Anemia   Endocrine: Diabetes ; Thyroid dysfunction[ ]   Home Medications Prior to Admission medications   Medication Sig Start Date End Date Taking? Authorizing Provider  amLODipine (NORVASC) 5 MG tablet Take 5 mg by mouth daily.   Yes Historical Provider, MD  apixaban (ELIQUIS) 5 MG TABS tablet Take 1 tablet (5 mg total) by mouth 2 (two) times daily. 01/12/15  Yes Jerald Kief, MD  atorvastatin (LIPITOR) 80 MG tablet Take 80 mg by mouth daily.   Yes Historical Provider, MD  carvedilol (COREG) 12.5 MG tablet Take 1.5 tablets (18.75 mg total) by mouth 2 (two) times daily with a meal. 04/02/15  Yes Laurey Morale, MD  lisinopril (PRINIVIL,ZESTRIL) 40 MG tablet Take 40 mg by mouth daily.   Yes Historical Provider, MD  pantoprazole (PROTONIX) 40 MG tablet Take 40 mg by mouth 2 (two) times daily before a meal.   Yes Historical Provider, MD  potassium chloride SA (K-DUR,KLOR-CON) 20 MEQ tablet Take 2 tablets (40 mEq total) by mouth daily. Patient taking differently: Take 40 mEq by mouth 2 (two)  times daily.  04/02/15  Yes Laurey Morale, MD  torsemide (DEMADEX) 20 MG tablet Take 3 tablets (60 mg total) by mouth 2 (two) times daily. Take additional 20mg  tablet once daily for weight 296lb or greater. 04/02/15  Yes Laurey Morale, MD  colchicine 0.6 MG tablet Take 1 tablet (0.6 mg total) by mouth daily. 06/11/12 04/02/15  Dayna N Dunn, [name]  spironolactone (ALDACTONE) 25 MG tablet Take 1 tablet (25 mg total) by mouth daily. 01/12/15   Jerald Kief, MD    Past Medical History: Past Medical History  Diagnosis Date  . Hypertension   . Morbid obesity   . H/O: GI bleed     "cause I took too much aspirin"  . Venous stasis ulcer   . Atrial fibrillation     a.  chronic   . Edema of lower extremity   . Chronic diastolic CHF (congestive heart failure) 12/2002    a. EF 50-55% by echo 04/2012  . Gout   . Hyperglycemia     Noted 05/2012    Past Surgical History: Past Surgical History  Procedure Laterality Date  . No past surgeries      Family History: Family History  Problem Relation Age of Onset  . Arrhythmia Father   . Hypertension Father   . Cancer Mother     cervical    Social History: History   Social History  . Marital Status: Divorced    Spouse Name: N/A  . Number of Children: N/A  . Years of Education: N/A   Occupational History  . Retired    Social History Main Topics  . Smoking status: Former Smoker    Types: Cigarettes    Quit date: 12/08/1965  . Smokeless tobacco: Never Used     Comment: "social cigarette smoker in my late teens"  . Alcohol Use: 8.4 oz/week    14 Cans of beer per week  . Drug Use: No  . Sexual Activity: Not Currently   Other Topics Concern  . None   Social History Narrative   Patient lives alone.    Allergies:  Allergies  Allergen Reactions  . Amlodipine Besylate Swelling    "started off low and ended up severe; if, in fact, that is what's causing the swelling" (06/03/12)    Objective:    Vital Signs:   Temp:  [98.2 F (36.8 C)-99 F (37.2 C)] 98.3 F (36.8 C) (08/01 1033) Pulse Rate:  [54-102] 71 (08/01 1033) Resp:  [18-27] 18 (08/01 1033) BP: (117-125)/(52-66) 124/56 mmHg (08/01 1033) SpO2:  [93 %-99 %] 97 % (08/01 1033) Weight:  [295 lb (133.811 kg)-305 lb 11.2 oz (138.665 kg)] 305 lb 11.2 oz (138.665 kg) (08/01 0040) Last BM Date: 07/08/15  Weight change: Filed Weights   07/08/15 1923 07/08/15 2123 07/09/15 0040  Weight: 295 lb (133.811 kg) 301 lb 8 oz (136.76 kg) 305 lb 11.2 oz (138.665 kg)    Intake/Output:   Intake/Output Summary (Last 24 hours) at 07/09/15 1230 Last data filed at 07/09/15 0858  Gross per 24 hour  Intake    850 ml  Output    650 ml  Net    200  ml     Physical Exam: General:  Well appearing. No resp difficulty. Sitting in chair.  HEENT: normal Neck: supple. JVP to jaw. Carotids 2+ bilat; no bruits. No lymphadenopathy or thryomegaly appreciated. Cor: PMI nondisplaced. Irregular rate & rhythm. No rubs, gallops or murmurs. Lungs: clear Abdomen: soft, nontender, nondistended.  No hepatosplenomegaly. No bruits or masses. Good bowel sounds. Extremities: no cyanosis, clubbing, rash,  RLE and LLE 3+ edema with serous exudate Neuro: alert & orientedx3, cranial nerves grossly intact. moves all 4 extremities w/o difficulty. Affect pleasant  Telemetry: A fib 70s  Labs: Basic Metabolic Panel:  Recent Labs Lab 07/08/15 1930 07/09/15 0250  NA 127* 129*  K 4.4 4.2  CL 93* 95*  CO2 24 26  GLUCOSE 143* 143*  BUN 17 17  CREATININE 1.22 1.22  CALCIUM 8.8* 8.8*    Liver Function Tests:  Recent Labs Lab 07/08/15 1930 07/09/15 0250  AST 12* 12*  ALT 9* 8*  ALKPHOS 158* 150*  BILITOT 0.8 0.9  PROT 6.5 6.7  ALBUMIN 3.3* 3.3*   No results for input(s): LIPASE, AMYLASE in the last 168 hours. No results for input(s): AMMONIA in the last 168 hours.  CBC:  Recent Labs Lab 07/08/15 1930 07/09/15 0250  WBC 8.2 9.7  NEUTROABS 6.2 7.1  HGB 11.1* 10.7*  HCT 33.3* 33.1*  MCV 85.8 87.8  PLT 291 300    Cardiac Enzymes: No results for input(s): CKTOTAL, CKMB, CKMBINDEX, TROPONINI in the last 168 hours.  BNP: BNP (last 3 results)  Recent Labs  03/07/15 1500 04/02/15 1244 07/08/15 1948  BNP 401.4* 223.9* 222.1*    ProBNP (last 3 results) No results for input(s): PROBNP in the last 8760 hours.   CBG: No results for input(s): GLUCAP in the last 168 hours.  Coagulation Studies: No results for input(s): LABPROT, INR in the last 72 hours.  Other results: EKG: A fib 74 bpm.   Imaging: Dg Chest 2 View  07/08/2015   CLINICAL DATA:  Bilateral lower extremity swelling for 3 days.  EXAM: CHEST  2 VIEW  COMPARISON:   01/04/2015  FINDINGS: There is marked cardiomegaly, unchanged. There is mild vascular congestion, unchanged. No alveolar edema or infiltrate. No large effusion.  IMPRESSION: Unchanged cardiomegaly. Mild vascular congestion, also unchanged and possibly chronic.   Electronically Signed   By: Ellery Plunk M.D.   On: 07/08/2015 21:37      Medications:     Current Medications: . amLODipine  5 mg Oral Daily  . apixaban  5 mg Oral BID  . atorvastatin  80 mg Oral Daily  . carvedilol  18.75 mg Oral BID WC  . doxycycline (VIBRAMYCIN) IV  100 mg Intravenous Q12H  . furosemide  80 mg Intravenous Q6H  . lisinopril  40 mg Oral Daily  . potassium chloride SA  40 mEq Oral BID  . saccharomyces boulardii  250 mg Oral BID  . sodium chloride  3 mL Intravenous Q12H     Infusions:      Assessment/Plan   Mr Binette is 69 year old admitted volume overload and suspected cellulitis.  1. A/C Diastolic HF- Most recent ECHO 08/1477 EF 55-60% . NYHA III. Volume status elevated. Stop intermittent lasix and start lasix 15 mg per hour. Continue 40 meq K twice a day. Watch renal function closely.  2. RLE suspected cellulitis- on doxycyline. WBC 9.7 . Consult WOC for leg wraps.  3. A Fib Chronic - ChadsVasc Score= 3. Rate controlled with carvedilol 18.75 twice a day.  On eliquis 5 mg twice a day  4.  HTN - Stable continue current regimen.  5. Hyponatremia- Na 129   Length of Stay: 1  CLEGG,AMY NP-C  07/09/2015, 12:30 PM  Advanced Heart Failure Team Pager [phone] (M-F; 7a - 4p)  Please contact [facility]  for night-coverage after hours (4p -7a ) and weekends on [email or web address]  Patient seen with NP, agree with the above note.  He says that he has been taking all his meds at home but has developed volume overload with significant lower extremity and scrotal edema.  Possible RLE cellulitis.  - Continue leg wraps.   - Lasix 15 mg/hr for diuresis, watch creatinine closely.  - Continue Coreg and Eliquis  for chronic atrial fibrillation.   [name] 07/09/2015 3:31 PM

## 2015-07-10 ENCOUNTER — Other Ambulatory Visit: Payer: Self-pay

## 2015-07-10 DIAGNOSIS — E876 Hypokalemia: Secondary | ICD-10-CM

## 2015-07-10 LAB — CBC WITH DIFFERENTIAL/PLATELET
BASOS PCT: 1 % (ref 0–1)
Basophils Absolute: 0 10*3/uL (ref 0.0–0.1)
Eosinophils Absolute: 0.7 10*3/uL (ref 0.0–0.7)
Eosinophils Relative: 9 % — ABNORMAL HIGH (ref 0–5)
HCT: 33.5 % — ABNORMAL LOW (ref 39.0–52.0)
Hemoglobin: 10.8 g/dL — ABNORMAL LOW (ref 13.0–17.0)
LYMPHS ABS: 0.5 10*3/uL — AB (ref 0.7–4.0)
LYMPHS PCT: 7 % — AB (ref 12–46)
MCH: 28.4 pg (ref 26.0–34.0)
MCHC: 32.2 g/dL (ref 30.0–36.0)
MCV: 88.2 fL (ref 78.0–100.0)
MONO ABS: 0.6 10*3/uL (ref 0.1–1.0)
Monocytes Relative: 7 % (ref 3–12)
NEUTROS ABS: 6.2 10*3/uL (ref 1.7–7.7)
Neutrophils Relative %: 76 % (ref 43–77)
PLATELETS: 306 10*3/uL (ref 150–400)
RBC: 3.8 MIL/uL — ABNORMAL LOW (ref 4.22–5.81)
RDW: 14.6 % (ref 11.5–15.5)
WBC: 8 10*3/uL (ref 4.0–10.5)

## 2015-07-10 LAB — BASIC METABOLIC PANEL
ANION GAP: 9 (ref 5–15)
BUN: 25 mg/dL — ABNORMAL HIGH (ref 6–20)
CALCIUM: 9 mg/dL (ref 8.9–10.3)
CO2: 28 mmol/L (ref 22–32)
CREATININE: 1.46 mg/dL — AB (ref 0.61–1.24)
Chloride: 91 mmol/L — ABNORMAL LOW (ref 101–111)
GFR, EST AFRICAN AMERICAN: 55 mL/min — AB (ref >60)
GFR, EST NON AFRICAN AMERICAN: 47 mL/min — AB (ref >60)
Glucose, Bld: 109 mg/dL — ABNORMAL HIGH (ref 65–99)
Potassium: 4.9 mmol/L (ref 3.5–5.1)
SODIUM: 128 mmol/L — AB (ref 135–145)

## 2015-07-10 MED ORDER — LISINOPRIL 20 MG PO TABS
20.0000 mg | ORAL_TABLET | Freq: Every day | ORAL | Status: DC
  Filled 2015-07-10: qty 1

## 2015-07-10 MED ORDER — CARVEDILOL 6.25 MG PO TABS
6.2500 mg | ORAL_TABLET | Freq: Two times a day (BID) | ORAL | Status: DC
  Administered 2015-07-10 – 2015-07-18 (×15): 6.25 mg via ORAL
  Filled 2015-07-10 (×18): qty 1

## 2015-07-10 NOTE — Consult Note (Signed)
   [facility] CM Inpatient Consult   07/10/2015  Jesus [name] 1946-05-26 [id number] Call received this morning regarding patient's previous encounters with [facility] Community nurse, Marchelle Folks.  This Clinical research associate will follow up with inpatient RNCM and MD regarding potential issues for re-entry in the community for post hospital follow up care needs.  Spoke with [facility] Community Care Coordinator regarding home health concerns and post hospital follow up.  Will continue to follow. Left a message for inpatient RNCM to call this Clinical research associate.  Will follow up.  For questions, please contact: Charlesetta Shanks, RN BSN CCM [facility]  [phone] business mobile phone

## 2015-07-10 NOTE — Progress Notes (Signed)
Pt's BP 85/46. MD paged. MD instructed to leave Lasix drip at current rate of 36mL/hr at this time due to amount of fluid overload. MD also instructed to only decrease Lasix drip to 17mL/hr if pt's SBP was less than 80. Will continue to monitor.

## 2015-07-10 NOTE — Discharge Instructions (Signed)

## 2015-07-10 NOTE — Progress Notes (Signed)
[facility] PROGRESS NOTE Interim History: 69 year old with history of obesity, chronic atrial fibrillation, resistant HTN, OA and diastolic CHF admitted with volume overload and suspected RLE cellulitis. HF team on board.  Filed Weights   07/08/15 2123 07/09/15 0040 07/10/15 0544  Weight: 136.76 kg (301 lb 8 oz) 138.665 kg (305 lb 11.2 oz) 137.576 kg (303 lb 4.8 oz)        Intake/Output Summary (Last 24 hours) at 07/10/15 1743 Last data filed at 07/10/15 1348  Gross per 24 hour  Intake   1080 ml  Output    250 ml  Net    830 ml   Assessment/Plan: 1-acute on chronic diastolic heart failure -will continue unna boots -start lasix drip as recommended by HF team -no CP and breathing ok -will follow low sodium diet, daily weights and strict I's and O's  2-Chronic atrial fibrillation: rate controlled -continue low dose coreg and eliquis  3-Chronic anemia: stable and w/o signs of overt bleeding -will monitor -most likely due to AOCD  4-Hypertension: with hypotension -will d/c lisinopril and amlodipine -coreg adjusted down to 6.25 -will monitor VS  5-presumed superimposed cellulitis -will continue treatment with doxycycline (plan is for 10 days total)  6-morbid obesity -Body mass index is 43.52 kg/(m^2). 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    Consultants:  HF team   Procedures: ECHO: none during this admission  Last echo with EF 55-60%  Antibiotics:  Doxycycline   HPI/Subjective: Afebrile, no CP and denies significant SOB.  Patient still with signs of fluid overload and significant scrotal edema  Objective: Filed Vitals:   07/10/15 0121 07/10/15 0544 07/10/15 0836 07/10/15 1300  BP: 85/46 101/64 100/48 55/53  Pulse: 66 106 75 69  Temp: 97.9 F (36.6 C) 98 F (36.7 C)  98 F (36.7 C)  TempSrc: Oral Oral  Oral  Resp: 20 18  20   Height:      Weight:  137.576 kg (303 lb 4.8 oz)    SpO2: 96% 96%  99%     Exam:  General: Alert, awake, oriented x3, in no acute distress. Still with significant LE swelling and scrotal edema HEENT: No bruits, no goiter. No JVD Heart: Regular rate, without murmurs, rubs, gallops. 2++ edema up to his knees; unna boots in place now. Lungs: Good air movement, no wheezing or crackles Abdomen: Soft, nontender, nondistended, positive bowel sounds.  Neuro: Grossly intact, nonfocal.   Data Reviewed: Basic Metabolic Panel:  Recent Labs Lab 07/08/15 1930 07/09/15 0250 07/10/15 0412  NA 127* 129* 128*  K 4.4 4.2 4.9  CL 93* 95* 91*  CO2 24 26 28   GLUCOSE 143* 143* 109*  BUN 17 17 25*  CREATININE 1.22 1.22 1.46*  CALCIUM 8.8* 8.8* 9.0   Liver Function Tests:  Recent Labs Lab 07/08/15 1930 07/09/15 0250  AST 12* 12*  ALT 9* 8*  ALKPHOS 158* 150*  BILITOT 0.8 0.9  PROT 6.5 6.7  ALBUMIN 3.3* 3.3*   CBC:  Recent Labs Lab 07/08/15 1930 07/09/15 0250 07/10/15 0845  WBC 8.2 9.7 8.0  NEUTROABS 6.2  7.1 6.2  HGB 11.1* 10.7* 10.8*  HCT 33.3* 33.1* 33.5*  MCV 85.8 87.8 88.2  PLT 291 300 306   BNP (last 3 results)  Recent Labs  03/07/15 1500 04/02/15 1244 07/08/15 1948  BNP 401.4* 223.9* 222.1*    Studies: Dg Chest 2 View  07/08/2015   CLINICAL DATA:  Bilateral lower extremity swelling for 3 days.  EXAM: CHEST  2 VIEW  COMPARISON:  01/04/2015  FINDINGS: There is marked cardiomegaly, unchanged. There is mild vascular congestion, unchanged. No alveolar edema or infiltrate. No large effusion.  IMPRESSION: Unchanged cardiomegaly. Mild vascular congestion,  also unchanged and possibly chronic.   Electronically Signed   By: Ellery Plunk M.D.   On: 07/08/2015 21:37    Scheduled Meds: . apixaban  5 mg Oral BID  . atorvastatin  80 mg Oral Daily  . carvedilol  6.25 mg Oral BID WC  . doxycycline  100 mg Oral Q12H  . potassium chloride SA  40 mEq Oral BID  . saccharomyces boulardii  250 mg Oral BID  . sodium chloride  3 mL Intravenous Q12H  . spironolactone  12.5 mg Oral Daily   Continuous Infusions: . furosemide (LASIX) infusion 15 mg/hr (07/10/15 0835)    Time: 30 minutes  Vassie Loll  [facility] Pager [phone]. If 7PM-7AM, please contact night-coverage at [email or web address], password [facility] 07/10/2015, 5:43 PM  LOS: 2 days

## 2015-07-10 NOTE — Progress Notes (Signed)
Pt no longer very active in urinating into collection containers. Pt now letting urine "just come out" onto incontinence pads. Due to pt's anatomy and scrotal edema, unable to place condom cath. If foley needed, may need to be placed by Urology. Will continue to monitor.

## 2015-07-10 NOTE — Progress Notes (Signed)
Heart Failure Navigator Consult Note  Presentation: Jesus [name] is a 69 year old with history of obesity, chronic atrial fibrillation, resistant HTN, OA and diastolic CHF admitted with volume overload and suspected RLE cellulitis. HF team consulted to assist with diuretics.   He has been followed in the HF clinic and was last evaluate by Dr Jesus [name] 4/25. Volume overloaded noted and he was instructed to take torsemide 60 mg twice a day.   He presented to [facility] ED with increased leg and scrotal edema. At baseline he is SOB with exertion but noticed increase dyspnea. Says he was taking his medications. CXR with vascular congestion noted. He has been diuresing with 80 mg IV lasix every 6 hours. Difficult to know mch urine due to incontinence.  Past Medical History  Diagnosis Date  . Hypertension   . Morbid obesity   . H/O: GI bleed     "cause I took too much aspirin"  . Venous stasis ulcer   . Atrial fibrillation     a. chronic   . Edema of lower extremity   . Chronic diastolic CHF (congestive heart failure) 12/2002    a. EF 50-55% by echo 04/2012  . Gout   . Hyperglycemia     Noted 05/2012    History   Social History  . Marital Status: Divorced    Spouse Name: N/A  . Number of Children: N/A  . Years of Education: N/A   Occupational History  . Retired    Social History Main Topics  . Smoking status: Former Smoker    Types: Cigarettes    Quit date: 12/08/1965  . Smokeless tobacco: Never Used     Comment: "social cigarette smoker in my late teens"  . Alcohol Use: 8.4 oz/week    14 Cans of beer per week  . Drug Use: No  . Sexual Activity: Not Currently   Other Topics Concern  . None   Social History Narrative   Patient lives alone.    ECHO:Study Conclusions--01/06/15  - Procedure narrative: Transthoracic echocardiography. Image quality was adequate. The study was technically difficult, as a result of poor acoustic windows, poor sound wave transmission, poor  patient compliance, restricted patient mobility, and body habitus. - Left ventricle: There was moderate concentric hypertrophy. Systolic function was normal. The estimated ejection fraction was in the range of 55% to 60%. Although no diagnostic regional wall motion abnormality was identified, this possibility cannot be completely excluded on the basis of this study. - Left atrium: The atrium was dilated. - Right ventricle: The cavity size was dilated. - Right atrium: The atrium was dilated. - Atrial septum: No defect or patent foramen ovale was identified.  Impressions:  - Very limited study. No apical views or Doppler analysis.  Transthoracic echocardiography. M-mode, complete 2D, spectral Doppler, and color Doppler. Birthdate: Patient birthdate: [birth year]. Age: Patient is 69 yr old. Sex: Gender: male. BMI: 42.8 kg/m^2. Blood pressure:   114/68 Patient status: Inpatient. Study date: Study date: 01/06/2015. Study time: 02:01 PM. Location: Bedside.  BNP    Component Value Date/Time   BNP 222.1* 07/08/2015 1948    ProBNP    Component Value Date/Time   PROBNP 291.0* 06/09/2013 0949     Education Assessment and Provision:  Detailed education and instructions provided on heart failure disease management including the following:  Signs and symptoms of Heart Failure When to call the physician Importance of daily weights Low sodium diet Fluid restriction Medication management Anticipated future follow-up appointments  Patient  education given on each of the above topics.  Patient acknowledges understanding and acceptance of all instructions.  I stopped back in briefly to see Jesus [name] regarding his HF.  He tells me that this is his "yearly trip to [facility]".  He is known from previous admissions and being seen in AHF Clinic.  He was seen in the AHF Clinic on 4/25 and was a "No Show" for May appt.  He says that he takes all medications "without fail" and  is "very careful with salt and sodium" in his diet.  He also says that he weighs at home daily.  I reinforced the importance of daily weights and when to call the physician related to his signs and symptoms.  He will have a follow-up appt in the AHF Clinic at discharge.  Education Materials:  "Living Better With Heart Failure" Booklet, Daily Weight Tracker Tool   High Risk Criteria for Readmission and/or Poor Patient Outcomes:  (Recommend Follow-up with Advanced Heart Failure Clinic)--yes   EF <30%-No-55-60%  2 or more admissions in 6 months- Yes  Difficult social situation- Yes--? Ability to care for himself at home  Demonstrates medication noncompliance- denies   Barriers of Care:  Knowledge and compliance--?ability to care for himself   Discharge Planning:   Plans to return home alone?

## 2015-07-10 NOTE — Progress Notes (Signed)
Advanced Heart Failure Rounding Note  PCP: Dr Sedalia Muta Primary HF Cardiologist: Dr Shirlee Latch  Subjective:    Admitted to [facility] with increased leg and scrotal edema.  DOE at baseline, but worsening.  No new complaints.  Says if swelling is going down, it is going down too slowly for him to tell.  No SOB or CP.   Difficult to watch urine output with incontinence, but down 2 lbs. Weight 286 in February.  Has been in 300s for past several months.  Baseline weight unclear. Has refused foley in the past but may benefit from if willing.  Objective:   Weight Range: 303 lb 4.8 oz (137.576 kg) Body mass index is 43.52 kg/(m^2).   Vital Signs:   Temp:  [97.9 F (36.6 C)-98.3 F (36.8 C)] 98 F (36.7 C) (08/02 0544) Pulse Rate:  [58-106] 106 (08/02 0544) Resp:  [18-20] 18 (08/02 0544) BP: (85-125)/(40-64) 101/64 mmHg (08/02 0544) SpO2:  [96 %-99 %] 96 % (08/02 0544) Weight:  [303 lb 4.8 oz (137.576 kg)] 303 lb 4.8 oz (137.576 kg) (08/02 0544) Last BM Date: 07/08/15  Weight change: Filed Weights   07/08/15 2123 07/09/15 0040 07/10/15 0544  Weight: 301 lb 8 oz (136.76 kg) 305 lb 11.2 oz (138.665 kg) 303 lb 4.8 oz (137.576 kg)    Intake/Output:   Intake/Output Summary (Last 24 hours) at 07/10/15 0733 Last data filed at 07/10/15 0121  Gross per 24 hour  Intake   1341 ml  Output    850 ml  Net    491 ml     Physical Exam: General: Well appearing. No resp difficulty. Sitting in chair.  HEENT: normal Neck: supple. JVP ?10 cm. Carotids 2+ bilat; no bruits. No lymphadenopathy or thryomegaly appreciated. Cor: PMI nondisplaced. Irregular rate & rhythm. No rubs, gallops or murmurs. Lungs: clear Abdomen: soft, nontender, nondistended. No hepatosplenomegaly. No bruits or masses. Good bowel sounds. Extremities: no cyanosis, clubbing, rash, RLE and LLE with UNNA boots in place. 2+ edema to knees Neuro: alert & orientedx3, cranial nerves grossly intact. moves all 4 extremities w/o difficulty.  Affect pleasant  Telemetry:  A fib 50s-80s  Labs: CBC  Recent Labs  07/08/15 1930 07/09/15 0250  WBC 8.2 9.7  NEUTROABS 6.2 7.1  HGB 11.1* 10.7*  HCT 33.3* 33.1*  MCV 85.8 87.8  PLT 291 300   Basic Metabolic Panel  Recent Labs  07/09/15 0250 07/10/15 0412  NA 129* 128*  K 4.2 4.9  CL 95* 91*  CO2 26 28  GLUCOSE 143* 109*  BUN 17 25*  CALCIUM 8.8* 9.0   Liver Function Tests  Recent Labs  07/08/15 1930 07/09/15 0250  AST 12* 12*  ALT 9* 8*  ALKPHOS 158* 150*  BILITOT 0.8 0.9  PROT 6.5 6.7  ALBUMIN 3.3* 3.3*   No results for input(s): LIPASE, AMYLASE in the last 72 hours. Cardiac Enzymes No results for input(s): CKTOTAL, CKMB, CKMBINDEX, TROPONINI in the last 72 hours.  BNP: BNP (last 3 results)  Recent Labs  03/07/15 1500 04/02/15 1244 07/08/15 1948  BNP 401.4* 223.9* 222.1*    ProBNP (last 3 results) No results for input(s): PROBNP in the last 8760 hours.   D-Dimer No results for input(s): DDIMER in the last 72 hours. Hemoglobin A1C No results for input(s): HGBA1C in the last 72 hours. Fasting Lipid Panel No results for input(s): CHOL, HDL, LDLCALC, TRIG, CHOLHDL, LDLDIRECT in the last 72 hours. Thyroid Function Tests  Recent Labs  07/09/15 0250  TSH  2.082    Other results:     Imaging/Studies:  Dg Chest 2 View  07/08/2015   CLINICAL DATA:  Bilateral lower extremity swelling for 3 days.  EXAM: CHEST  2 VIEW  COMPARISON:  01/04/2015  FINDINGS: There is marked cardiomegaly, unchanged. There is mild vascular congestion, unchanged. No alveolar edema or infiltrate. No large effusion.  IMPRESSION: Unchanged cardiomegaly. Mild vascular congestion, also unchanged and possibly chronic.   Electronically Signed   By: Ellery Plunk M.D.   On: 07/08/2015 21:37     Latest Echo  Latest Cath   Medications:     Scheduled Medications: . amLODipine  5 mg Oral Daily  . apixaban  5 mg Oral BID  . atorvastatin  80 mg Oral Daily  .  carvedilol  18.75 mg Oral BID WC  . doxycycline  100 mg Oral Q12H  . lisinopril  40 mg Oral Daily  . potassium chloride SA  40 mEq Oral BID  . saccharomyces boulardii  250 mg Oral BID  . sodium chloride  3 mL Intravenous Q12H  . spironolactone  12.5 mg Oral Daily     Infusions: . furosemide (LASIX) infusion 15 mg/hr (07/09/15 1356)     PRN Medications:  acetaminophen **OR** acetaminophen, ondansetron **OR** ondansetron (ZOFRAN) IV   Assessment/Plan   Jesus [name] is 69 year old admitted volume overload and suspected cellulitis.  1. A/C Diastolic HF- Most recent ECHO 12/6107 EF 55-60% . NYHA III. Volume status elevated.  - Continue lasix 15 mg per hour.  - Continue 40 meq K twice a day. Watch renal function closely.  2. RLE suspected cellulitis - on doxycyline. WBC 9.7 07/09/15 . Consult WOC for leg wraps.  3. A Fib Chronic - ChadsVasc Score= 3.  - Rate controlled with carvedilol.  Will decrease to 6.25 mg bid with low BP. On eliquis 5 mg twice a day  4. HTN - Some hypotension yesterday, will hold amlodipine and lisinopril.  5. Hyponatremia- Na 128. Fluid restrict.  6. AKI on CKD stage 2:  - Cr 1.22 -> 1.46. Baseline 1.10 3 months ago.  As above, stopping lisinopril.  - Will follow closely with diuresis  Length of Stay: 2  Graciella Freer [name] 07/10/2015, 7:33 AM  Advanced Heart Failure Team Pager [phone] (M-F; 7a - 4p)  Please contact [facility] for night-coverage after hours (4p -7a ) and weekends on [email or web address]  Patient seen with PA, agree with the above note.  Still with volume overload.  Weight down 2 lbs with Lasix infusion, unable to measure I/Os accurately.  Creatinine up a bit and BP low.  He says his daughter fixes his pillbox and he does not know what he is taking at home.  I suspect that he may not be taking the BP regimen that he was listed as taking at home.  - Continue Lasix gtt.  - Stop amlodipine and lisinopril with low BP, will decrease  Coreg to 6.25 mg bid.  - Low Na, fluid restrict.  - Continue Eliquis.  - Follow creatinine closely.   [name] 07/10/2015 9:42 AM

## 2015-07-10 NOTE — Patient Outreach (Signed)
Care Coordination: New referral for this patient received. Placed call to Charlesetta Shanks, [facility] hospital liaison, to discuss patients past care with [facility]. ( documentation in legacy system)  Shared concerns about patient being able to manage at home when discharged based on previous contacts and discussions with primary MD, patients daughter and patient.. Patient was not seen at the home by [facility] due to safety concerns.   Suggested an inpatient social work referral.    Plan:  Will continue to collaborate with [facility] Liaison about discharge plans.  Rowe Pavy, RN, BSN, CEN [facility] [phone]

## 2015-07-11 DIAGNOSIS — N179 Acute kidney failure, unspecified: Secondary | ICD-10-CM

## 2015-07-11 DIAGNOSIS — N189 Chronic kidney disease, unspecified: Secondary | ICD-10-CM

## 2015-07-11 LAB — BASIC METABOLIC PANEL
Anion gap: 8 (ref 5–15)
BUN: 31 mg/dL — AB (ref 6–20)
CO2: 29 mmol/L (ref 22–32)
Calcium: 9.3 mg/dL (ref 8.9–10.3)
Chloride: 95 mmol/L — ABNORMAL LOW (ref 101–111)
Creatinine, Ser: 1.36 mg/dL — ABNORMAL HIGH (ref 0.61–1.24)
GFR calc Af Amer: 60 mL/min — ABNORMAL LOW (ref >60)
GFR calc non Af Amer: 52 mL/min — ABNORMAL LOW (ref >60)
Glucose, Bld: 118 mg/dL — ABNORMAL HIGH (ref 65–99)
Potassium: 4.6 mmol/L (ref 3.5–5.1)
Sodium: 132 mmol/L — ABNORMAL LOW (ref 135–145)

## 2015-07-11 LAB — CBC WITH DIFFERENTIAL/PLATELET
Basophils Absolute: 0 10*3/uL (ref 0.0–0.1)
Basophils Relative: 1 % (ref 0–1)
EOS ABS: 0.7 10*3/uL (ref 0.0–0.7)
Eosinophils Relative: 9 % — ABNORMAL HIGH (ref 0–5)
HCT: 33.3 % — ABNORMAL LOW (ref 39.0–52.0)
Hemoglobin: 10.6 g/dL — ABNORMAL LOW (ref 13.0–17.0)
LYMPHS ABS: 0.4 10*3/uL — AB (ref 0.7–4.0)
Lymphocytes Relative: 5 % — ABNORMAL LOW (ref 12–46)
MCH: 27.7 pg (ref 26.0–34.0)
MCHC: 31.8 g/dL (ref 30.0–36.0)
MCV: 87.2 fL (ref 78.0–100.0)
MONO ABS: 1.1 10*3/uL — AB (ref 0.1–1.0)
Monocytes Relative: 13 % — ABNORMAL HIGH (ref 3–12)
Neutro Abs: 5.8 10*3/uL (ref 1.7–7.7)
Neutrophils Relative %: 72 % (ref 43–77)
Platelets: 339 10*3/uL (ref 150–400)
RBC: 3.82 MIL/uL — AB (ref 4.22–5.81)
RDW: 14.6 % (ref 11.5–15.5)
WBC: 8 10*3/uL (ref 4.0–10.5)

## 2015-07-11 MED ORDER — FUROSEMIDE 10 MG/ML IJ SOLN
20.0000 mg/h | INTRAVENOUS | Status: DC
  Administered 2015-07-11 – 2015-07-15 (×6): 20 mg/h via INTRAVENOUS
  Filled 2015-07-11 (×14): qty 25

## 2015-07-11 MED ORDER — METOLAZONE 2.5 MG PO TABS
2.5000 mg | ORAL_TABLET | Freq: Once | ORAL | Status: AC
  Administered 2015-07-11: 2.5 mg via ORAL
  Filled 2015-07-11: qty 1

## 2015-07-11 NOTE — Evaluation (Signed)
Physical Therapy Evaluation Patient Details Name: Jesus [name] MRN: [id number] DOB: [birth year] Today's Date: 07/11/2015   History of Present Illness  69 year old with history of obesity, chronic atrial fibrillation, resistant HTN, OA and diastolic CHF admitted with volume overload and suspected RLE cellulitis.  Clinical Impression  Pt admitted with above diagnosis. Pt currently with functional limitations due to the deficits listed below (see PT Problem List). Pt's mobility currently very limited by quick fatigue and bilateral LE and scrotal swelling, tolerated only 19' and relied on RW for support.  Pt will benefit from skilled PT to increase their independence and safety with mobility to allow discharge to the venue listed below.       Follow Up Recommendations Home health PT    Equipment Recommendations  None recommended by PT    Recommendations for Other Services OT consult     Precautions / Restrictions Precautions Precautions: Fall Restrictions Weight Bearing Restrictions: No      Mobility  Bed Mobility               General bed mobility comments: pt received in chair  Transfers Overall transfer level: Needs assistance Equipment used: Rolling walker (2 wheeled) Transfers: Sit to/from Stand Sit to Stand: Min guard         General transfer comment: pt with increased effort to stand, heavy push on arm rests of chair, wide BOS due to scrotal swelling and pt reports achiness all over  Ambulation/Gait Ambulation/Gait assistance: Min guard Ambulation Distance (Feet): 70 Feet Assistive device: Rolling walker (2 wheeled) Gait Pattern/deviations: Step-through pattern;Wide base of support Gait velocity: decreased Gait velocity interpretation: <1.8 ft/sec, indicative of risk for recurrent falls General Gait Details: veyr wide base with trunk flexion and much wt through RW. Would not have been able to ambulate without RW today. Decreased step height and quick  fatigue  Stairs            Wheelchair Mobility    Modified Rankin (Stroke Patients Only)       Balance Overall balance assessment: Needs assistance Sitting-balance support: Feet supported Sitting balance-Leahy Scale: Good     Standing balance support: Bilateral upper extremity supported Standing balance-Leahy Scale: Poor Standing balance comment: pt off balance with swelling and discomfort, needs UE support at this time                             Pertinent Vitals/Pain Pain Assessment: Faces Faces Pain Scale: Hurts even more Pain Location: scrotum and LE's Pain Intervention(s): Monitored during session;Limited activity within patient's tolerance    Home Living Family/patient expects to be discharged to:: Private residence Living Arrangements: Alone Available Help at Discharge: Family;Available PRN/intermittently Type of Home: Apartment Home Access: Level entry     Home Layout: One level Home Equipment: Cane - single point;Walker - 2 wheels;Crutches      Prior Function Level of Independence: Independent with assistive device(s)         Comments: uses cane off and on     Hand Dominance   Dominant Hand: Right    Extremity/Trunk Assessment   Upper Extremity Assessment: Overall WFL for tasks assessed           Lower Extremity Assessment: Generalized weakness;RLE deficits/detail;LLE deficits/detail RLE Deficits / Details: grossly 3+/5 throughout, serous drainage coming from right LE with foul odor.  LLE Deficits / Details: same as RLE though less swollen  Cervical / Trunk Assessment: Kyphotic  Communication   Communication: No difficulties  Cognition Arousal/Alertness: Awake/alert Behavior During Therapy: WFL for tasks assessed/performed Overall Cognitive Status: Within Functional Limits for tasks assessed                      General Comments      Exercises General Exercises - Lower Extremity Ankle Circles/Pumps:  AROM;Both;10 reps;Seated      Assessment/Plan    PT Assessment Patient needs continued PT services  PT Diagnosis Difficulty walking;Abnormality of gait;Generalized weakness;Acute pain   PT Problem List Decreased strength;Decreased activity tolerance;Decreased range of motion;Decreased balance;Decreased mobility;Decreased knowledge of use of DME;Decreased knowledge of precautions;Pain;Decreased skin integrity;Obesity  PT Treatment Interventions DME instruction;Gait training;Functional mobility training;Therapeutic activities;Therapeutic exercise;Balance training;Patient/family education   PT Goals (Current goals can be found in the Care Plan section) Acute Rehab PT Goals Patient Stated Goal: return home, have legs get better PT Goal Formulation: With patient Time For Goal Achievement: 07/25/15 Potential to Achieve Goals: Good    Frequency Min 3X/week   Barriers to discharge Decreased caregiver support family checks on by phone every few days    Co-evaluation               End of Session   Activity Tolerance: Patient limited by fatigue;Patient limited by pain Patient left: in chair;with call bell/phone within reach Nurse Communication: Mobility status         Time: 1436-1500 PT Time Calculation (min) (ACUTE ONLY): 24 min   Charges:         PT G Codes:       Lyanne Co, PT  Acute Rehab Services  [phone]  Lyanne Co 07/11/2015, 3:58 PM

## 2015-07-11 NOTE — Care Management Important Message (Signed)
Important Message  Patient Details  Name: Jesus [name] MRN: [id number] Date of Birth: 04-Dec-1946   Medicare Important Message Given:  Yes-second notification given    [name] 07/11/2015, 10:48 AM

## 2015-07-11 NOTE — Progress Notes (Signed)
Orthopedic Tech Progress Note Patient Details:  Jesus [name] [birth year] [id number]  Ortho Devices Type of Ortho Device: Roland Rack boot Ortho Device/Splint Location: bilateral Ortho Device/Splint Interventions: Application   [name] 07/11/2015, 10:26 AM

## 2015-07-11 NOTE — Progress Notes (Signed)
[facility] PROGRESS NOTE Interim History: 69 year old with history of obesity, chronic atrial fibrillation, resistant HTN, OA and diastolic CHF admitted with volume overload and suspected RLE cellulitis. HF team on board.  Filed Weights   07/09/15 0040 07/10/15 0544 07/11/15 0522  Weight: 138.665 kg (305 lb 11.2 oz) 137.576 kg (303 lb 4.8 oz) 137.122 kg (302 lb 4.8 oz)        Intake/Output Summary (Last 24 hours) at 07/11/15 1528 Last data filed at 07/11/15 1400  Gross per 24 hour  Intake 1135.75 ml  Output   1526 ml  Net -390.25 ml   Assessment/Plan: 1-acute on chronic diastolic heart failure -will continue unna boots -Lasix IV Gtt increased to 20.  -weight stable.  -HF team following.  -on spirolactone, and coreg.  -received  A dose of metolazone 8-3.  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   6-morbid obesity -Body mass index is 43.38 kg/(m^2). 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    8-Hypokalemia: -due to diuresis  -resolved. On supplement.   Code Status: Full Family Communication: no family at [place] Disposition Plan: home when medically stable.    Consultants:  HF team   Procedures: ECHO: none during this admission  Last echo with EF 55-60%  Antibiotics:  Doxycycline   HPI/Subjective: He is breathing ok, no chest pain.   Objective: Filed Vitals:   07/10/15 2031 07/11/15 0522 07/11/15  0749 07/11/15 1400  BP: 102/64 114/62 98/49 121/61  Pulse: 65 60 95 75  Temp: 98.6 F (37 C) 98 F (36.7 C)  98.9 F (37.2 C)  TempSrc: Oral Oral  Oral  Resp: Height:      Weight:  137.122 kg (302 lb 4.8 oz)    SpO2: 96% 98%  100%     Exam:  General: Alert, awake, oriented x3, in no acute distress.  HEENT: No bruits, no goiter. No JVD Heart: Regular rate, without murmurs, rubs, gallops. Lungs: Good air movement, no wheezing or crackles Abdomen: Soft, nontender, nondistended, positive bowel sounds.  Neuro: Grossly intact, nonfocal. LE; bilateral unna boot.    Data Reviewed: Basic Metabolic Panel:  Recent Labs Lab 07/08/15 1930 07/09/15 0250 07/10/15 0412 07/11/15 0420  NA 127* 129* 128* 132*  K 4.4 4.2 4.9 4.6  CL 93* 95* 91* 95*  CO2 GLUCOSE 143* 143* 109* 118*  BUN 17 17 25* 31*  CREATININE 1.22 1.22 1.46* 1.36*  CALCIUM 8.8* 8.8* 9.0 9.3   Liver Function Tests:  Recent Labs Lab 07/08/15 1930 07/09/15 0250  AST 12* 12*  ALT 9* 8*  ALKPHOS 158* 150*  BILITOT 0.8 0.9  PROT 6.5 6.7  ALBUMIN 3.3* 3.3*   CBC:  Recent Labs Lab 07/08/15 1930 07/09/15 0250 07/10/15 0845 07/11/15 0420  WBC 8.2 9.7 8.0 8.0  NEUTROABS 6.2 7.1 6.2 5.8  HGB 11.1* 10.7* 10.8* 10.6*  HCT 33.3* 33.1* 33.5* 33.3*  MCV 85.8 87.8 88.2 87.2  PLT 291 300 306  339   BNP (last 3 results)  Recent Labs  03/07/15 1500 04/02/15 1244 07/08/15 1948  BNP 401.4* 223.9* 222.1*    Studies: No results found.  Scheduled Meds: . apixaban  5 mg Oral BID  . atorvastatin  80 mg Oral Daily  . carvedilol  6.25 mg Oral BID WC  . doxycycline  100 mg Oral Q12H  . potassium chloride SA  40 mEq Oral BID  . saccharomyces boulardii  250 mg Oral BID  . sodium chloride  3 mL Intravenous Q12H  . spironolactone  12.5 mg Oral Daily   Continuous Infusions: . furosemide (LASIX) infusion 20 mg/hr (07/11/15 0751)    Time: 30 minutes  Phala Schraeder A  [facility] Pager [phone]. If 7PM-7AM, please contact night-coverage at [email or web address], password [facility] 07/11/2015, 3:28 PM  LOS: 3 days

## 2015-07-11 NOTE — Progress Notes (Signed)
Advanced Heart Failure Rounding Note  PCP: Dr Sedalia Muta Primary HF Cardiologist: Dr Shirlee Latch  Subjective:    Admitted to [facility] with increased leg and scrotal edema.  DOE at baseline, but worsening.  Unchanged from yesterday.  Has no specific complaints but says he can't tell if swelling is going down. Says his legs feel better now that they are wrapped.  Denies SOB or CP.   Difficult to record urine output with incontinence, but down 1 lb. Weight 286 in February.  Has been in 300s for past several months. Baseline weight unclear. Has refused foley in the past but may benefit from this admission if willing.  Objective:   Weight Range: 302 lb 4.8 oz (137.122 kg) Body mass index is 43.38 kg/(m^2).   Vital Signs:   Temp:  [98 F (36.7 C)-98.6 F (37 C)] 98 F (36.7 C) (08/03 0522) Pulse Rate:  [60-75] 60 (08/03 0522) Resp:  [18-20] 18 (08/03 0522) BP: (55-114)/(48-64) 114/62 mmHg (08/03 0522) SpO2:  [96 %-99 %] 98 % (08/03 0522) Weight:  [302 lb 4.8 oz (137.122 kg)] 302 lb 4.8 oz (137.122 kg) (08/03 0522) Last BM Date: 07/10/15  Weight change: Filed Weights   07/09/15 0040 07/10/15 0544 07/11/15 0522  Weight: 305 lb 11.2 oz (138.665 kg) 303 lb 4.8 oz (137.576 kg) 302 lb 4.8 oz (137.122 kg)    Intake/Output:   Intake/Output Summary (Last 24 hours) at 07/11/15 0740 Last data filed at 07/11/15 0523  Gross per 24 hour  Intake   1225 ml  Output   1075 ml  Net    150 ml     Physical Exam: General: Well appearing. No resp difficulty. Sitting in chair.  HEENT: normal Neck: supple. JVP 12 cm. Carotids 2+ bilat; no bruits. No lymphadenopathy or thryomegaly appreciated. Cor: PMI nondisplaced. Irregular rate & rhythm. No rubs, gallops or murmurs. Lungs: clear Abdomen: soft, nontender, nondistended. No hepatosplenomegaly. No bruits or masses. Good bowel sounds. Extremities: no cyanosis, clubbing, rash, RLE and LLE with UNNA boots in place. 2+ edema to knees Neuro: alert & orientedx3,  cranial nerves grossly intact. moves all 4 extremities w/o difficulty. Affect pleasant  Telemetry:  A fib 60s-70s  Labs: CBC  Recent Labs  07/10/15 0845 07/11/15 0420  WBC 8.0 8.0  NEUTROABS 6.2 5.8  HGB 10.8* 10.6*  HCT 33.5* 33.3*  MCV 88.2 87.2  PLT 306 339   Basic Metabolic Panel  Recent Labs  07/10/15 0412 07/11/15 0420  NA 128* 132*  K 4.9 4.6  CL 91* 95*  CO2 28 29  GLUCOSE 109* 118*  BUN 25* 31*  CALCIUM 9.0 9.3   Liver Function Tests  Recent Labs  07/08/15 1930 07/09/15 0250  AST 12* 12*  ALT 9* 8*  ALKPHOS 158* 150*  BILITOT 0.8 0.9  PROT 6.5 6.7  ALBUMIN 3.3* 3.3*   No results for input(s): LIPASE, AMYLASE in the last 72 hours. Cardiac Enzymes No results for input(s): CKTOTAL, CKMB, CKMBINDEX, TROPONINI in the last 72 hours.  BNP: BNP (last 3 results)  Recent Labs  03/07/15 1500 04/02/15 1244 07/08/15 1948  BNP 401.4* 223.9* 222.1*    ProBNP (last 3 results) No results for input(s): PROBNP in the last 8760 hours.   D-Dimer No results for input(s): DDIMER in the last 72 hours. Hemoglobin A1C No results for input(s): HGBA1C in the last 72 hours. Fasting Lipid Panel No results for input(s): CHOL, HDL, LDLCALC, TRIG, CHOLHDL, LDLDIRECT in the last 72 hours. Thyroid Function Tests  Recent Labs  07/09/15 0250  TSH 2.082    Other results:     Imaging/Studies:  No results found.  Latest Echo  Latest Cath   Medications:     Scheduled Medications: . apixaban  5 mg Oral BID  . atorvastatin  80 mg Oral Daily  . carvedilol  6.25 mg Oral BID WC  . doxycycline  100 mg Oral Q12H  . potassium chloride SA  40 mEq Oral BID  . saccharomyces boulardii  250 mg Oral BID  . sodium chloride  3 mL Intravenous Q12H  . spironolactone  12.5 mg Oral Daily    Infusions: . furosemide (LASIX) infusion 15 mg/hr (07/10/15 0835)    PRN Medications: acetaminophen **OR** acetaminophen, ondansetron **OR** ondansetron (ZOFRAN)  IV   Assessment/Plan   Jesus [name] is 69 year old admitted volume overload and suspected cellulitis.  1. A/C Diastolic HF- Most recent ECHO 01/1307 EF 55-60% . NYHA III.  - Still considerably fluid overloaded.  - Increase lasix 20 mg per hour with dose of 2.5 mg metolazone today. - Continue 40 meq K twice a day. Watch renal function closely.  2. RLE suspected cellulitis - on doxycyline. WBC 9.7 07/09/15 . Consult WOC for leg wraps.  3. A Fib Chronic - ChadsVasc Score= 3.  - Rate controlled with carvedilol.  Continue 6.25 mg bid with low BPs yesterday. On eliquis 5 mg twice a day  4. HTN - Some hypotension recently, will hold amlodipine and lisinopril for now. 5. Hyponatremia- Na 132, Improved. Continue to fluid restrict.  6. AKI on CKD stage 2:  - Cr 1.22 -> 1.46 -> 1.36. Baseline 1.10 3 months ago.  Holding lisinopril as of yesterday.  - Will follow closely with diuresis  Length of Stay: 3  Jesus Freer [name] 07/11/2015, 7:40 AM  Advanced Heart Failure Team Pager [phone] (M-F; 7a - 4p)  Please contact [facility] for night-coverage after hours (4p -7a ) and weekends on [email or web address]  Patient seen with PA, agree with the above note.  BP stable today off amlodipine and lisinopril and on lower Coreg.  HR controlled in atrial fibrillation.  Still volume overloaded, diuresis not marked yesterday.  Creatinine stable.  - Increase Lasix to 20 mg/hr  - Will give dose of metolazone 2.5 mg x 1.  - Continue to replace K.   Jesus [name] 07/11/2015 11:15 AM

## 2015-07-11 NOTE — Consult Note (Signed)
   [facility] Inpatient Consult   07/11/2015  Jesus [name] Jan 11, 1946 [id number] Patient was discussed in unit progression meeting with inpatient [facility] and attending MD,  for potential barriers for home health needs at discharge for possible wound care.  Patient will likely need a willing participant for wound care.   [facility] Care Management will continue to follow for needs.  For questions, please contact: Charlesetta Shanks, RN BSN CCM [facility]  [phone] business mobile phone

## 2015-07-12 LAB — BASIC METABOLIC PANEL
Anion gap: 10 (ref 5–15)
BUN: 33 mg/dL — ABNORMAL HIGH (ref 6–20)
CALCIUM: 9.1 mg/dL (ref 8.9–10.3)
CO2: 27 mmol/L (ref 22–32)
Chloride: 92 mmol/L — ABNORMAL LOW (ref 101–111)
Creatinine, Ser: 1.24 mg/dL (ref 0.61–1.24)
GFR calc Af Amer: >60 mL/min (ref >60)
GFR, EST NON AFRICAN AMERICAN: 58 mL/min — AB (ref >60)
Glucose, Bld: 100 mg/dL — ABNORMAL HIGH (ref 65–99)
Potassium: 4.4 mmol/L (ref 3.5–5.1)
Sodium: 129 mmol/L — ABNORMAL LOW (ref 135–145)

## 2015-07-12 LAB — CBC WITH DIFFERENTIAL/PLATELET
BASOS ABS: 0.1 10*3/uL (ref 0.0–0.1)
Basophils Relative: 1 % (ref 0–1)
Eosinophils Absolute: 0.6 10*3/uL (ref 0.0–0.7)
Eosinophils Relative: 7 % — ABNORMAL HIGH (ref 0–5)
HCT: 33.3 % — ABNORMAL LOW (ref 39.0–52.0)
Hemoglobin: 10.8 g/dL — ABNORMAL LOW (ref 13.0–17.0)
Lymphocytes Relative: 6 % — ABNORMAL LOW (ref 12–46)
Lymphs Abs: 0.5 10*3/uL — ABNORMAL LOW (ref 0.7–4.0)
MCH: 28.1 pg (ref 26.0–34.0)
MCHC: 32.4 g/dL (ref 30.0–36.0)
MCV: 86.7 fL (ref 78.0–100.0)
Monocytes Absolute: 1.3 10*3/uL — ABNORMAL HIGH (ref 0.1–1.0)
Monocytes Relative: 15 % — ABNORMAL HIGH (ref 3–12)
NEUTROS ABS: 6.1 10*3/uL (ref 1.7–7.7)
Neutrophils Relative %: 71 % (ref 43–77)
Platelets: 335 10*3/uL (ref 150–400)
RBC: 3.84 MIL/uL — AB (ref 4.22–5.81)
RDW: 14.7 % (ref 11.5–15.5)
WBC: 8.6 10*3/uL (ref 4.0–10.5)

## 2015-07-12 MED ORDER — METOLAZONE 5 MG PO TABS
5.0000 mg | ORAL_TABLET | Freq: Once | ORAL | Status: AC
  Administered 2015-07-12: 5 mg via ORAL
  Filled 2015-07-12: qty 1

## 2015-07-12 MED ORDER — METOLAZONE 5 MG PO TABS
5.0000 mg | ORAL_TABLET | Freq: Two times a day (BID) | ORAL | Status: DC
  Administered 2015-07-13: 5 mg via ORAL
  Filled 2015-07-12 (×3): qty 1

## 2015-07-12 NOTE — Progress Notes (Signed)
Advanced Heart Failure Rounding Note  PCP: Dr Sedalia Muta Primary HF Cardiologist: Dr Shirlee Latch  Subjective:    Admitted to [facility] with increased leg and scrotal edema.  DOE at baseline, but worsening.  Feels overall ok. Says he still can't tell if fluid is coming off quickly or swelling is going down. legs feel better with wraps. Denies SOB or CP. Refused foley cath.  Difficult to record urine output with incontinence, weight down 1 lb again today. Weight 286 in February.  Has been in 300s for past several months. Baseline weight unclear.   Objective:   Weight Range: 301 lb 12.8 oz (136.896 kg) Body mass index is 43.3 kg/(m^2).   Vital Signs:   Temp:  [98.4 F (36.9 C)-99.5 F (37.5 C)] 98.4 F (36.9 C) (08/04 0523) Pulse Rate:  [53-95] 61 (08/04 0523) Resp:  [18-20] 18 (08/04 0523) BP: (98-121)/(44-68) 108/68 mmHg (08/04 0523) SpO2:  [97 %-100 %] 98 % (08/04 0523) Weight:  [301 lb 12.8 oz (136.896 kg)] 301 lb 12.8 oz (136.896 kg) (08/04 0523) Last BM Date: 07/10/15  Weight change: Filed Weights   07/10/15 0544 07/11/15 0522 07/12/15 0523  Weight: 303 lb 4.8 oz (137.576 kg) 302 lb 4.8 oz (137.122 kg) 301 lb 12.8 oz (136.896 kg)    Intake/Output:   Intake/Output Summary (Last 24 hours) at 07/12/15 0724 Last data filed at 07/12/15 0521  Gross per 24 hour  Intake 1695.75 ml  Output    926 ml  Net 769.75 ml     Physical Exam: General: Well appearing. No resp difficulty. Sitting in chair.  HEENT: normal Neck: supple. JVP 12 cm. Carotids 2+ bilat; no bruits. No lymphadenopathy or thryomegaly appreciated. Cor: PMI nondisplaced. Irregular rate & rhythm. No rubs, gallops or murmurs. Lungs: clear Abdomen: soft, nontender, nondistended. No hepatosplenomegaly. No bruits or masses. Good bowel sounds. Extremities: no cyanosis, clubbing, rash, RLE and LLE with UNNA boots in place. 2+ edema to knees Neuro: alert & orientedx3, cranial nerves grossly intact. moves all 4 extremities w/o  difficulty. Affect pleasant  Telemetry:  A fib 80s  Labs: CBC  Recent Labs  07/11/15 0420 07/12/15 0318  WBC 8.0 8.6  NEUTROABS 5.8 6.1  HGB 10.6* 10.8*  HCT 33.3* 33.3*  MCV 87.2 86.7  PLT 339 335   Basic Metabolic Panel  Recent Labs  07/11/15 0420 07/12/15 0318  NA 132* 129*  K 4.6 4.4  CL 95* 92*  CO2 29 27  GLUCOSE 118* 100*  BUN 31* 33*  CALCIUM 9.3 9.1   Liver Function Tests No results for input(s): AST, ALT, ALKPHOS, BILITOT, PROT, ALBUMIN in the last 72 hours. No results for input(s): LIPASE, AMYLASE in the last 72 hours. Cardiac Enzymes No results for input(s): CKTOTAL, CKMB, CKMBINDEX, TROPONINI in the last 72 hours.  BNP: BNP (last 3 results)  Recent Labs  03/07/15 1500 04/02/15 1244 07/08/15 1948  BNP 401.4* 223.9* 222.1*    ProBNP (last 3 results) No results for input(s): PROBNP in the last 8760 hours.   D-Dimer No results for input(s): DDIMER in the last 72 hours. Hemoglobin A1C No results for input(s): HGBA1C in the last 72 hours. Fasting Lipid Panel No results for input(s): CHOL, HDL, LDLCALC, TRIG, CHOLHDL, LDLDIRECT in the last 72 hours. Thyroid Function Tests No results for input(s): TSH, T4TOTAL, T3FREE, THYROIDAB in the last 72 hours.  Invalid input(s): FREET3  Other results:     Imaging/Studies:  No results found.  Latest Echo  Latest Cath  Medications:     Scheduled Medications: . apixaban  5 mg Oral BID  . atorvastatin  80 mg Oral Daily  . carvedilol  6.25 mg Oral BID WC  . doxycycline  100 mg Oral Q12H  . potassium chloride SA  40 mEq Oral BID  . saccharomyces boulardii  250 mg Oral BID  . sodium chloride  3 mL Intravenous Q12H  . spironolactone  12.5 mg Oral Daily    Infusions: . furosemide (LASIX) infusion 20 mg/hr (07/11/15 0751)    PRN Medications: acetaminophen **OR** acetaminophen, ondansetron **OR** ondansetron (ZOFRAN) IV   Assessment/Plan   Mr Abdalla is 69 year old admitted  volume overload and suspected cellulitis.  1. A/C Diastolic HF- Most recent ECHO 12/6107 EF 55-60% . NYHA III.  - Still considerably fluid overloaded. Does not seem to be responding well to diuresis. - Continue lasix 20 mg per hour with another dose of metolazone 5 mg today. - Continue 40 meq K twice a day. Watch renal function closely.  2. RLE suspected cellulitis - on doxycyline. WBC 8.6. Consult WOC for leg wraps.  3. A Fib Chronic - ChadsVasc Score= 3.  - Rate controlled with carvedilol.  Continue 6.25 mg bid with low BPs. On eliquis 5 mg twice a day  4. HTN - Some hypotension recently, will hold amlodipine and lisinopril for now. 5. Hyponatremia- Na 129. Continue to fluid restrict.  6. AKI on CKD stage 2:  - Cr 1.22 -> 1.46 -> 1.36 -> 1.24. Baseline 1.10 3 months ago.  Holding lisinopril since 07/10/15 - Will follow closely with diuresis  Length of Stay: 4  Graciella Freer [name] 07/12/2015, 7:24 AM  Advanced Heart Failure Team Pager [phone] (M-F; 7a - 4p)  Please contact [facility] for night-coverage after hours (4p -7a ) and weekends on [email or web address]  Patient seen with PA, agree with the above note.  Diuresing slowly, weight down another lb.  Still volume overloaded.  Creatinine stable.  Increase metolazone to bid today.  If diuresis is not more vigorous, would add acetazolamide tomorrow.   [name] 07/12/2015 1:32 PM

## 2015-07-12 NOTE — Progress Notes (Signed)
[facility] PROGRESS NOTE Interim History: 69 year old with history of obesity, chronic atrial fibrillation, resistant HTN, OA and diastolic CHF admitted with volume overload and suspected RLE cellulitis. HF team on board.  Filed Weights   07/10/15 0544 07/11/15 0522 07/12/15 0523  Weight: 137.576 kg (303 lb 4.8 oz) 137.122 kg (302 lb 4.8 oz) 136.896 kg (301 lb 12.8 oz)        Intake/Output Summary (Last 24 hours) at 07/12/15 1356 Last data filed at 07/12/15 1100  Gross per 24 hour  Intake   1240 ml  Output    825 ml  Net    415 ml   Assessment/Plan: 1-acute on chronic diastolic heart failure -will continue unna boots -Lasix IV Gtt increased to 20.  -weight stable.  -HF team following.  -on spirolactone, and coreg.  - metolazone change to BID.  -weight only one pound down.   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   6-morbid obesity -Body mass index is 43.3 kg/(m^2). -low calorie diet and exercise discussed with patient  7-Acute on chronic kidney disease: stage 2-3 at baseline -due to CHF exacerbation, along with hypotensive episodes and diuresis -will monitor renal function.   8-Hypokalemia: -due to diuresis  -resolved. On supplement.   Code Status: Full Family Communication: no family at [place] Disposition Plan: home when medically stable.    Consultants:  HF team   Procedures: ECHO: none during this admission  Last echo with EF 55-60%  Antibiotics:  Doxycycline   HPI/Subjective: He is breathing ok, no chest pain.  he has scrotal swelling since admission, not worsening  redness or pain. Think scrotal swelling has decreased.   Objective: Filed Vitals:   07/11/15 1400 07/11/15 2101 07/12/15 0135 07/12/15 0523  BP: 121/61 105/44 120/46 108/68  Pulse: 75 71 53 61  Temp: 98.9 F (37.2 C) 99.5 F (37.5 C) 98.6 F (37 C) 98.4 F (36.9 C)  TempSrc: Oral Oral Oral Oral  Resp: Height:      Weight:    136.896 kg (301 lb 12.8 oz)  SpO2: 100% 99% 97% 98%     Exam:  General: Alert, awake, oriented x3, in no acute distress.  HEENT: No bruits, no goiter. No JVD Heart: Regular rate, without murmurs, rubs, gallops. Lungs: Good air movement, no wheezing or crackles Abdomen: Soft, nontender, nondistended, positive bowel sounds. Scrotal swelling  Neuro: Grossly intact, nonfocal. LE; bilateral unna boot. Redness LE    Data Reviewed: Basic Metabolic Panel:  Recent Labs Lab 07/08/15 1930 07/09/15 0250 07/10/15 0412 07/11/15 0420 07/12/15 0318  NA 127* 129* 128* 132* 129*  K 4.4 4.2 4.9 4.6 4.4  CL 93* 95* 91* 95* 92*  CO2 GLUCOSE 143* 143* 109* 118* 100*  BUN 17 17 25* 31* 33*  CREATININE 1.22 1.22 1.46* 1.36* 1.24  CALCIUM 8.8* 8.8* 9.0 9.3 9.1   Liver Function Tests:  Recent Labs Lab 07/08/15 1930 07/09/15 0250  AST 12* 12*  ALT 9* 8*  ALKPHOS 158* 150*  BILITOT 0.8 0.9  PROT 6.5 6.7  ALBUMIN 3.3* 3.3*   CBC:  Recent Labs Lab  07/08/15 1930 07/09/15 0250 07/10/15 0845 07/11/15 0420 07/12/15 0318  WBC 8.2 9.7 8.0 8.0 8.6  NEUTROABS 6.2 7.1 6.2 5.8 6.1  HGB 11.1* 10.7* 10.8* 10.6* 10.8*  HCT 33.3* 33.1* 33.5* 33.3* 33.3*  MCV 85.8 87.8 88.2 87.2 86.7  PLT 291 300 306 339 335   BNP (last 3 results)  Recent Labs  03/07/15 1500 04/02/15 1244 07/08/15 1948  BNP 401.4* 223.9* 222.1*    Studies: No results found.  Scheduled Meds: . apixaban  5 mg Oral BID  . atorvastatin  80 mg Oral Daily  . carvedilol  6.25 mg Oral BID WC  . doxycycline  100 mg Oral Q12H  . metolazone  5 mg Oral Once  .  [START ON 07/13/2015] metolazone  5 mg Oral BID  . potassium chloride SA  40 mEq Oral BID  . saccharomyces boulardii  250 mg Oral BID  . sodium chloride  3 mL Intravenous Q12H  . spironolactone  12.5 mg Oral Daily   Continuous Infusions: . furosemide (LASIX) infusion 20 mg/hr (07/11/15 0751)    Time: 30 minutes  Jerami Tammen A  [facility] Pager [phone]. If 7PM-7AM, please contact night-coverage at [email or web address], password [facility] 07/12/2015, 1:56 PM  LOS: 4 days

## 2015-07-13 LAB — BASIC METABOLIC PANEL
ANION GAP: 11 (ref 5–15)
ANION GAP: 12 (ref 5–15)
BUN: 39 mg/dL — AB (ref 6–20)
BUN: 40 mg/dL — ABNORMAL HIGH (ref 6–20)
CALCIUM: 9.4 mg/dL (ref 8.9–10.3)
CHLORIDE: 88 mmol/L — AB (ref 101–111)
CO2: 31 mmol/L (ref 22–32)
CO2: 29 mmol/L (ref 22–32)
CREATININE: 1.34 mg/dL — AB (ref 0.61–1.24)
Calcium: 9.3 mg/dL (ref 8.9–10.3)
Chloride: 88 mmol/L — ABNORMAL LOW (ref 101–111)
Creatinine, Ser: 1.65 mg/dL — ABNORMAL HIGH (ref 0.61–1.24)
GFR calc Af Amer: >60 mL/min (ref >60)
GFR calc Af Amer: 47 mL/min — ABNORMAL LOW (ref >60)
GFR calc non Af Amer: 41 mL/min — ABNORMAL LOW (ref >60)
GFR, EST NON AFRICAN AMERICAN: 52 mL/min — AB (ref >60)
GLUCOSE: 155 mg/dL — AB (ref 65–99)
Glucose, Bld: 229 mg/dL — ABNORMAL HIGH (ref 65–99)
POTASSIUM: 4 mmol/L (ref 3.5–5.1)
Potassium: 3.9 mmol/L (ref 3.5–5.1)
Sodium: 130 mmol/L — ABNORMAL LOW (ref 135–145)
Sodium: 129 mmol/L — ABNORMAL LOW (ref 135–145)

## 2015-07-13 LAB — CBC WITH DIFFERENTIAL/PLATELET
Basophils Absolute: 0 10*3/uL (ref 0.0–0.1)
Basophils Relative: 0 % (ref 0–1)
Eosinophils Absolute: 0.5 10*3/uL (ref 0.0–0.7)
Eosinophils Relative: 6 % — ABNORMAL HIGH (ref 0–5)
HCT: 34.7 % — ABNORMAL LOW (ref 39.0–52.0)
Hemoglobin: 11.2 g/dL — ABNORMAL LOW (ref 13.0–17.0)
Lymphocytes Relative: 3 % — ABNORMAL LOW (ref 12–46)
Lymphs Abs: 0.3 10*3/uL — ABNORMAL LOW (ref 0.7–4.0)
MCH: 27.7 pg (ref 26.0–34.0)
MCHC: 32.3 g/dL (ref 30.0–36.0)
MCV: 85.7 fL (ref 78.0–100.0)
MONOS PCT: 10 % (ref 3–12)
Monocytes Absolute: 1 10*3/uL (ref 0.1–1.0)
Neutro Abs: 7.7 10*3/uL (ref 1.7–7.7)
Neutrophils Relative %: 81 % — ABNORMAL HIGH (ref 43–77)
PLATELETS: 376 10*3/uL (ref 150–400)
RBC: 4.05 MIL/uL — AB (ref 4.22–5.81)
RDW: 14.5 % (ref 11.5–15.5)
WBC: 9.5 10*3/uL (ref 4.0–10.5)

## 2015-07-13 MED ORDER — METOLAZONE 5 MG PO TABS
5.0000 mg | ORAL_TABLET | Freq: Two times a day (BID) | ORAL | Status: DC
  Filled 2015-07-13 (×2): qty 1

## 2015-07-13 NOTE — Care Management Note (Signed)
Case Management Note  Patient Details  Name: Jesus [name] MRN: [id number] Date of Birth: August 28, 1946  Subjective/Objective:       Admitted with CHF             Action/Plan: Talked to patient about DCP; patient lives alone with family members near by, patient was active with [facility] and request to have them again for [facility] services; referral faxed to [facility]; Patient stated that he has  Crutches and a cane at home but does not use them. Patient has private insurance with [facility] Medicare with prescription drug coverage and does not have any problems getting his medication.  Expected Discharge Date:    possibly 07/15/2015              Expected Discharge Plan:  Home w Home Health Services  Discharge planning Services  CM Consult Choice offered to:  Patient    HH Arranged:  RN, PT, Disease Management HH Agency:  [facility]  Status of Service:  In process, will continue to follow  Medicare Important Message Given:  [facility] notification given  [name] [phone] 07/13/2015, 10:31 AM

## 2015-07-13 NOTE — Progress Notes (Signed)
Advanced Heart Failure Rounding Note  PCP: Dr Sedalia Muta Primary HF Cardiologist: Dr Shirlee Latch  Subjective:    Admitted to [facility] with increased leg and scrotal edema.  DOE at baseline, but worsening.  Feels good today.  He can finally tell a difference in his legs.  Wants to know when he will be able to go home. Denies SOB, CO, or lightheadedness.   Difficult to record urine output with incontinence, weight down 7 lb today. Weight 286 in February.  294 today.  Objective:   Weight Range: 294 lb 4.8 oz (133.494 kg) Body mass index is 42.23 kg/(m^2).   Vital Signs:   Temp:  [98 F (36.7 C)-99.1 F (37.3 C)] 99.1 F (37.3 C) (08/05 0449) Pulse Rate:  [64-90] 90 (08/05 0449) Resp:  [18] 18 (08/05 0449) BP: (97-114)/(55-65) 97/65 mmHg (08/05 0449) SpO2:  [96 %-98 %] 98 % (08/05 0449) Weight:  [294 lb 4.8 oz (133.494 kg)] 294 lb 4.8 oz (133.494 kg) (08/05 0449) Last BM Date: 07/10/15  Weight change: Filed Weights   07/11/15 0522 07/12/15 0523 07/13/15 0449  Weight: 302 lb 4.8 oz (137.122 kg) 301 lb 12.8 oz (136.896 kg) 294 lb 4.8 oz (133.494 kg)    Intake/Output:   Intake/Output Summary (Last 24 hours) at 07/13/15 0736 Last data filed at 07/13/15 0707  Gross per 24 hour  Intake 2442.33 ml  Output   1775 ml  Net 667.33 ml     Physical Exam: General: Well appearing. No resp difficulty. Sitting in chair.  HEENT: normal Neck: supple. JVP 10 cm. Carotids 2+ bilat; no bruits. No lymphadenopathy or thryomegaly appreciated. Cor: PMI nondisplaced. Irregular rate & rhythm. No rubs, gallops or murmurs. Lungs: clear Abdomen: soft, nontender, nondistended. No hepatosplenomegaly. No bruits or masses. Good bowel sounds. Extremities: no cyanosis, clubbing, rash, RLE and LLE with UNNA boots in place. 2+ edema to knees Neuro: alert & orientedx3, cranial nerves grossly intact. moves all 4 extremities w/o difficulty. Affect pleasant  Telemetry:  A fib 80s  Labs: CBC  Recent Labs   07/12/15 0318 07/13/15 0411  WBC 8.6 9.5  NEUTROABS 6.1 7.7  HGB 10.8* 11.2*  HCT 33.3* 34.7*  MCV 86.7 85.7  PLT 335 376   Basic Metabolic Panel  Recent Labs  07/12/15 0318 07/13/15 0411  NA 129* 130*  K 4.4 3.9  CL 92* 88*  CO2 27 31  GLUCOSE 100* 229*  BUN 33* 39*  CALCIUM 9.1 9.4   Liver Function Tests No results for input(s): AST, ALT, ALKPHOS, BILITOT, PROT, ALBUMIN in the last 72 hours. No results for input(s): LIPASE, AMYLASE in the last 72 hours. Cardiac Enzymes No results for input(s): CKTOTAL, CKMB, CKMBINDEX, TROPONINI in the last 72 hours.  BNP: BNP (last 3 results)  Recent Labs  03/07/15 1500 04/02/15 1244 07/08/15 1948  BNP 401.4* 223.9* 222.1*    ProBNP (last 3 results) No results for input(s): PROBNP in the last 8760 hours.   D-Dimer No results for input(s): DDIMER in the last 72 hours. Hemoglobin A1C No results for input(s): HGBA1C in the last 72 hours. Fasting Lipid Panel No results for input(s): CHOL, HDL, LDLCALC, TRIG, CHOLHDL, LDLDIRECT in the last 72 hours. Thyroid Function Tests No results for input(s): TSH, T4TOTAL, T3FREE, THYROIDAB in the last 72 hours.  Invalid input(s): FREET3  Other results:     Imaging/Studies:  No results found.  Latest Echo  Latest Cath   Medications:     Scheduled Medications: . apixaban  5 mg  Oral BID  . atorvastatin  80 mg Oral Daily  . carvedilol  6.25 mg Oral BID WC  . doxycycline  100 mg Oral Q12H  . metolazone  5 mg Oral BID  . potassium chloride SA  40 mEq Oral BID  . saccharomyces boulardii  250 mg Oral BID  . sodium chloride  3 mL Intravenous Q12H  . spironolactone  12.5 mg Oral Daily    Infusions: . furosemide (LASIX) infusion 20 mg/hr (07/13/15 0707)    PRN Medications: acetaminophen **OR** acetaminophen, ondansetron **OR** ondansetron (ZOFRAN) IV   Assessment/Plan   Mr Ronan is 69 year old admitted volume overload and suspected cellulitis.  1. A/C  Diastolic HF- Most recent ECHO 12/6107 EF 55-60% . NYHA III.  - Responded well to diuresis yesterday with 7 lbs down.  I/O skewed by incontinence. - Continue lasix 20 mg per hour with metolazone. - Continue 40 meq K twice a day. Watch renal function closely.  - Cr up and more heme concentrated on labs.  Large component of peripheral edema may be chronic venous insufficiency. - Consider adding acetazolamide.   2. RLE suspected cellulitis - on doxycyline. WBC 8.6. Consult WOC for leg wraps.  3. A Fib Chronic - ChadsVasc Score= 3.  - Rate controlled with carvedilol.  Continue 6.25 mg bid with low BPs. On eliquis 5 mg twice a day  4. HTN - Some hypotension recently, will hold amlodipine and lisinopril for now. 5. Hyponatremia- Na 129. Continue to fluid restrict.  6. AKI on CKD stage 2:  - Cr 1.22 -> 1.46 -> 1.36 -> 1.24 -> 1.34. Baseline 1.10 3 months ago.  Holding lisinopril since 07/10/15 - Will follow closely with diuresis  Dispo: Likely home in next day or so.  Length of Stay: 5  Graciella Freer [name] 07/13/2015, 7:36 AM  Advanced Heart Failure Team Pager [phone] (M-F; 7a - 4p)  Please contact [facility] for night-coverage after hours (4p -7a ) and weekends on [email or web address]  Patient seen with PA, agree with the above note.  Weight down, legs thinner.  Will give one more day Lasix gtt + metolazone 5 mg x 1 today.  Hopefully transition over to po tomorrow.  Creatinine a bit higher.   [name] 07/13/2015 8:51 AM

## 2015-07-13 NOTE — Progress Notes (Signed)
[facility] PROGRESS NOTE Interim History: 69 year old with history of obesity, chronic atrial fibrillation, resistant HTN, OA and diastolic CHF admitted with volume overload and suspected RLE cellulitis. HF team on board.  Filed Weights   07/11/15 0522 07/12/15 0523 07/13/15 0449  Weight: 137.122 kg (302 lb 4.8 oz) 136.896 kg (301 lb 12.8 oz) 133.494 kg (294 lb 4.8 oz)        Intake/Output Summary (Last 24 hours) at 07/13/15 1459 Last data filed at 07/13/15 1300  Gross per 24 hour  Intake 2442.33 ml  Output   1325 ml  Net 1117.33 ml   Assessment/Plan: 1-acute on chronic diastolic heart failure -will continue unna boots -Lasix IV Gtt 20.  -weight slowly trending down.  -HF team following.  -on spirolactone, and coreg.  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   6-morbid obesity -Body mass index is 42.23 kg/(m^2). 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    8-Hypokalemia: -due to diuresis  -resolved. On supplement.   Code Status: Full Family Communication: no family at [place] Disposition Plan: home when medically stable.    Consultants:  HF team   Procedures: ECHO: none during this admission  Last echo with EF 55-60%  Antibiotics:  Doxycycline   HPI/Subjective: He is breathing ok, no chest pain.  dema decreasing slowly Objective: Filed Vitals:   07/13/15 0449 07/13/15 0817 07/13/15  1006 07/13/15 1400  BP: 97/65 130/109 98/53 98/57  Pulse: 90 88 62 73  Temp: 99.1 F (37.3 C)   98.5 F (36.9 C)  TempSrc: Oral   Oral  Resp: 18   18  Height:      Weight: 133.494 kg (294 lb 4.8 oz)     SpO2: 98%   100%     Exam:  General: Alert, awake, oriented x3, in no acute distress.  HEENT: No bruits, no goiter. No JVD Heart: Regular rate, without murmurs, rubs, gallops. Lungs: Good air movement, no wheezing or crackles Abdomen: Soft, nontender, nondistended, positive bowel sounds.  Neuro: Grossly intact, nonfocal. LE; bilateral unna boot.    Data Reviewed: Basic Metabolic Panel:  Recent Labs Lab 07/10/15 0412 07/11/15 0420 07/12/15 0318 07/13/15 0411 07/13/15 0940  NA 128* 132* 129* 130* 129*  K 4.9 4.6 4.4 3.9 4.0  CL 91* 95* 92* 88* 88*  CO2 28 29 27 31 29   GLUCOSE 109* 118* 100* 229* 155*  BUN 25* 31* 33* 39* 40*  CREATININE 1.46* 1.36* 1.24 1.34* 1.65*  CALCIUM 9.0 9.3 9.1 9.4 9.3   Liver Function Tests:  Recent Labs Lab 07/08/15 1930 07/09/15 0250  AST 12* 12*  ALT 9* 8*  ALKPHOS 158* 150*  BILITOT 0.8 0.9  PROT 6.5 6.7  ALBUMIN 3.3* 3.3*   CBC:  Recent Labs Lab 07/09/15 0250 07/10/15 0845 07/11/15 0420 07/12/15 0318 07/13/15 0411  WBC 9.7 8.0 8.0 8.6 9.5  NEUTROABS 7.1 6.2 5.8 6.1 7.7  HGB 10.7* 10.8* 10.6* 10.8* 11.2*  HCT 33.1* 33.5* 33.3* 33.3* 34.7*  MCV 87.8 88.2 87.2 86.7 85.7  PLT 300 306 339 335 376   BNP (last 3 results)  Recent Labs  03/07/15 1500 04/02/15 1244 07/08/15 1948  BNP 401.4* 223.9* 222.1*    Studies: No results found.  Scheduled Meds: . apixaban  5 mg Oral BID  . atorvastatin  80 mg Oral Daily  . carvedilol  6.25 mg Oral BID WC  . doxycycline  100 mg Oral Q12H  . potassium chloride SA  40 mEq Oral BID  . saccharomyces boulardii  250 mg Oral BID  . sodium chloride  3 mL Intravenous Q12H  . spironolactone  12.5 mg Oral Daily   Continuous Infusions: . furosemide (LASIX) infusion 20 mg/hr  (07/13/15 0707)    Time: 30 minutes  Arwin Bisceglia A  [facility] Pager [phone]. If 7PM-7AM, please contact night-coverage at [email or web address], password [facility] 07/13/2015, 2:59 PM  LOS: 5 days

## 2015-07-13 NOTE — Progress Notes (Signed)
Physical Therapy Treatment Patient Details Name: Jesus [name] MRN: [id number] DOB: [birth year] Today's Date: 07/13/2015    History of Present Illness 69 year old with history of obesity, chronic atrial fibrillation, resistant HTN, OA and diastolic CHF admitted with volume overload and suspected RLE cellulitis.    PT Comments    Pt making progress with mobility and was able to stand to pull up pants, ambulate to restroom and into hallway with use of RW at S level with cues for improved gait pattern.  Pt requires total A for peri care in restroom and note large area of skin breakdown and redness between gluteal folds.  RN aware.  Also note that pt has quite a lot of drainage from LLE.  Feel that pt is high level from a mobility stand point, however would likely benefit from ALF for longer term care as he lives on his own.  Unsure if pt willing to consider this.   Follow Up Recommendations  Home health PT     Equipment Recommendations  Rolling walker with 5" wheels (if pt doesn't own one already)    Recommendations for Other Services       Precautions / Restrictions Precautions Precautions: Fall Precaution Comments: B unna boots Restrictions Weight Bearing Restrictions: No    Mobility  Bed Mobility               General bed mobility comments: pt received in chair  Transfers Overall transfer level: Needs assistance Equipment used: Rolling walker (2 wheeled) Transfers: Sit to/from Stand Sit to Stand: Supervision         General transfer comment: Cues for safety and hand placement but able to stand at S level.   Ambulation/Gait Ambulation/Gait assistance: Supervision Ambulation Distance (Feet): 120 Feet Assistive device: Rolling walker (2 wheeled) Gait Pattern/deviations: Step-through pattern;Decreased stride length;Trunk flexed;Wide base of support Gait velocity: decreased   General Gait Details: Continues to have wide BOS due to scrotal edema and trunk  flexion.  cues for posture, however difficult to corect step length due to edema.    Stairs            Wheelchair Mobility    Modified Rankin (Stroke Patients Only)       Balance Overall balance assessment: Needs assistance Sitting-balance support: Feet supported Sitting balance-Leahy Scale: Good     Standing balance support: During functional activity;Single extremity supported Standing balance-Leahy Scale: Fair                      Cognition Arousal/Alertness: Awake/alert Behavior During Therapy: WFL for tasks assessed/performed Overall Cognitive Status: Within Functional Limits for tasks assessed                      Exercises      General Comments        Pertinent Vitals/Pain Pain Assessment: 0-10 Pain Score: 5  Pain Location: scrotum and LEs Pain Descriptors / Indicators: Discomfort Pain Intervention(s): Limited activity within patient's tolerance;Monitored during session;Repositioned    Home Living                      Prior Function            PT Goals (current goals can now be found in the care plan section) Acute Rehab PT Goals Patient Stated Goal: return home, have legs get better PT Goal Formulation: With patient Time For Goal Achievement: 07/25/15 Potential to Achieve Goals: Good Progress towards  PT goals: Progressing toward goals    Frequency  Min 3X/week    PT Plan Current plan remains appropriate    Co-evaluation             End of Session   Activity Tolerance: Patient limited by pain Patient left: in chair;with call bell/phone within reach     Time: 1610-9604 PT Time Calculation (min) (ACUTE ONLY): 39 min  Charges:  $Gait Training: 8-22 mins $Therapeutic Activity: 23-37 mins                    G Codes:      Jesus [name] 07/13/2015, 5:08 PM

## 2015-07-14 DIAGNOSIS — E871 Hypo-osmolality and hyponatremia: Secondary | ICD-10-CM

## 2015-07-14 LAB — BASIC METABOLIC PANEL
Anion gap: 15 (ref 5–15)
BUN: 52 mg/dL — ABNORMAL HIGH (ref 6–20)
CO2: 31 mmol/L (ref 22–32)
Calcium: 9.5 mg/dL (ref 8.9–10.3)
Chloride: 82 mmol/L — ABNORMAL LOW (ref 101–111)
Creatinine, Ser: 1.63 mg/dL — ABNORMAL HIGH (ref 0.61–1.24)
GFR calc non Af Amer: 41 mL/min — ABNORMAL LOW (ref >60)
GFR, EST AFRICAN AMERICAN: 48 mL/min — AB (ref >60)
Glucose, Bld: 110 mg/dL — ABNORMAL HIGH (ref 65–99)
Potassium: 4.1 mmol/L (ref 3.5–5.1)
Sodium: 128 mmol/L — ABNORMAL LOW (ref 135–145)

## 2015-07-14 LAB — CBC WITH DIFFERENTIAL/PLATELET
BASOS ABS: 0.1 10*3/uL (ref 0.0–0.1)
Basophils Relative: 1 % (ref 0–1)
Eosinophils Absolute: 0.7 10*3/uL (ref 0.0–0.7)
Eosinophils Relative: 9 % — ABNORMAL HIGH (ref 0–5)
HCT: 35.7 % — ABNORMAL LOW (ref 39.0–52.0)
Hemoglobin: 11.5 g/dL — ABNORMAL LOW (ref 13.0–17.0)
LYMPHS PCT: 7 % — AB (ref 12–46)
Lymphs Abs: 0.6 10*3/uL — ABNORMAL LOW (ref 0.7–4.0)
MCH: 27.6 pg (ref 26.0–34.0)
MCHC: 32.2 g/dL (ref 30.0–36.0)
MCV: 85.6 fL (ref 78.0–100.0)
MONO ABS: 1.1 10*3/uL — AB (ref 0.1–1.0)
Monocytes Relative: 14 % — ABNORMAL HIGH (ref 3–12)
NEUTROS ABS: 5.5 10*3/uL (ref 1.7–7.7)
Neutrophils Relative %: 69 % (ref 43–77)
Platelets: 385 10*3/uL (ref 150–400)
RBC: 4.17 MIL/uL — ABNORMAL LOW (ref 4.22–5.81)
RDW: 14.5 % (ref 11.5–15.5)
WBC: 7.9 10*3/uL (ref 4.0–10.5)

## 2015-07-14 MED ORDER — METOLAZONE 5 MG PO TABS
5.0000 mg | ORAL_TABLET | Freq: Two times a day (BID) | ORAL | Status: DC
  Administered 2015-07-14 (×2): 5 mg via ORAL
  Filled 2015-07-14 (×4): qty 1

## 2015-07-14 NOTE — Care Management Note (Signed)
Case Management Note  Patient Details  Name: DANIELA HERNAN MRN: [id number] Date of Birth: Apr 17, 1946  Subjective/Objective:                  CHF Exacerbation  Action/Plan: Home w HHRN, HHPT, HHOT   Expected Discharge Date:                  Expected Discharge Plan:  Home w Home Health Services  In-House Referral:     Discharge planning Services  CM Consult  Post Acute Care Choice:    Choice offered to:  Patient  DME Arranged:    DME Agency:     HH Arranged:  RN, PT, Disease Management HH Agency:  [facility]  Status of Service:  In process, will continue to follow  Medicare Important Message Given:  Yes-second notification given Date Medicare IM Given:    Medicare IM give by:    Date Additional Medicare IM Given:    Additional Medicare Important Message give by:     If discussed at Long Length of Stay Meetings, dates discussed:    Additional Comments:  Contacted by weekday CM for follow up ..do to patient issues with [facility] agency his 1 st choice was not available, patient chose [facility] as 2nd choice, Weekday CM asked this writer to contact agency.  T[facility] rep spoken too, services set up for HHPT/ HHOT/ HHRN.  Notified DME rep of need for rolling walker.  No additional needs at this time.     Barnett Applebaum, RN 07/14/2015, 9:27 AM

## 2015-07-14 NOTE — Progress Notes (Signed)
[facility] PROGRESS NOTE Interim History: 69 year old with history of obesity, chronic atrial fibrillation, resistant HTN, OA and diastolic CHF admitted with volume overload and suspected RLE cellulitis. HF team on board.  Filed Weights   07/12/15 0523 07/13/15 0449 07/14/15 0505  Weight: 136.896 kg (301 lb 12.8 oz) 133.494 kg (294 lb 4.8 oz) 134.355 kg (296 lb 3.2 oz)        Intake/Output Summary (Last 24 hours) at 07/14/15 1326 Last data filed at 07/14/15 0909  Gross per 24 hour  Intake 750.33 ml  Output    900 ml  Net -149.67 ml   Assessment/Plan: 1-acute on chronic diastolic heart failure -will continue unna boots -Lasix IV Gtt 20.  -weight slowly trending down.  -HF team following.  -on spirolactone, and coreg.  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    8-Hypokalemia: -due to diuresis  -resolved. On supplement.   Code Status: Full Family Communication: no family at [place] Disposition Plan: home when medically stable.    Consultants:  HF team   Procedures: ECHO: none during this admission  Last echo with EF 55-60%  Antibiotics:  Doxycycline   HPI/Subjective: No chest pain, no abdominal pain.   Objective: Filed Vitals:   07/13/15 1400 07/13/15 2112 07/14/15 0505 07/14/15 0958  BP:  98/57 119/57 100/52 114/58  Pulse: 73 78 58 77  Temp: 98.5 F (36.9 C) 98.3 F (36.8 C) 98 F (36.7 C) 98.5 F (36.9 C)  TempSrc: Oral Oral Oral Oral  Resp: 18 18 18 18   Height:      Weight:   134.355 kg (296 lb 3.2 oz)   SpO2: 100% 100% 98% 97%     Exam:  General: Alert, awake, oriented x3, in no acute distress.  HEENT: No bruits, no goiter. No JVD Heart: Regular rate, without murmurs, rubs, gallops. Lungs: Good air movement, no wheezing or crackles Abdomen: Soft, nontender, nondistended, positive bowel sounds.  Neuro: Grossly intact, nonfocal. LE; bilateral unna boot.    Data Reviewed: Basic Metabolic Panel:  Recent Labs Lab 07/11/15 0420 07/12/15 0318 07/13/15 0411 07/13/15 0940 07/14/15 0331  NA 132* 129* 130* 129* 128*  K 4.6 4.4 3.9 4.0 4.1  CL 95* 92* 88* 88* 82*  CO2 29 27 31 29 31   GLUCOSE 118* 100* 229* 155* 110*  BUN 31* 33* 39* 40* 52*  CREATININE 1.36* 1.24 1.34* 1.65* 1.63*  CALCIUM 9.3 9.1 9.4 9.3 9.5   Liver Function Tests:  Recent Labs Lab 07/08/15 1930 07/09/15 0250  AST 12* 12*  ALT 9* 8*  ALKPHOS 158* 150*  BILITOT 0.8 0.9  PROT 6.5 6.7  ALBUMIN 3.3* 3.3*   CBC:  Recent Labs Lab 07/10/15 0845 07/11/15 0420 07/12/15 0318 07/13/15 0411 07/14/15 0331  WBC 8.0 8.0 8.6 9.5 7.9  NEUTROABS 6.2 5.8 6.1 7.7 5.5  HGB 10.8* 10.6* 10.8* 11.2* 11.5*  HCT 33.5*  33.3* 33.3* 34.7* 35.7*  MCV 88.2 87.2 86.7 85.7 85.6  PLT 306 339 335 376 385   BNP (last 3 results)  Recent Labs  03/07/15 1500 04/02/15 1244 07/08/15 1948  BNP 401.4* 223.9* 222.1*    Studies: No results found.  Scheduled Meds: . apixaban  5 mg Oral BID  . atorvastatin  80 mg Oral Daily  . carvedilol  6.25 mg Oral BID WC  . doxycycline  100 mg Oral Q12H  . metolazone  5 mg Oral BID  . potassium chloride SA  40 mEq Oral BID  . saccharomyces boulardii  250 mg Oral BID  . sodium chloride  3 mL Intravenous Q12H  . spironolactone  12.5 mg Oral Daily    Continuous Infusions: . furosemide (LASIX) infusion 20 mg/hr (07/14/15 1303)    Time: 30 minutes  Jesus [name] A  [facility] Pager [phone]. If 7PM-7AM, please contact night-coverage at [email or web address], password [email or web address] 07/14/2015, 1:26 PM  LOS: 6 days

## 2015-07-14 NOTE — Progress Notes (Signed)
Unna boots changed today by ortho tech.

## 2015-07-14 NOTE — Progress Notes (Signed)
Unable to measure output because patient will not urinate in the urinal. He urinates on the pad in the floor because hes unable to use the urinal.

## 2015-07-14 NOTE — Progress Notes (Signed)
Orthopedic Tech Progress Note Patient Details:  Jesus [name] 12/04/1946 [id number]  Ortho Devices Type of Ortho Device: Roland Rack boot Ortho Device/Splint Location: bilateral Ortho Device/Splint Interventions: Application   [name] 07/14/2015, 11:59 AM

## 2015-07-14 NOTE — Progress Notes (Signed)
Patient Name: Jesus [name] Date of Encounter: 07/14/2015  Active Problems:   CHF (congestive heart failure)   Hypertension   Acute on chronic diastolic CHF (congestive heart failure)   Chronic atrial fibrillation   Chronic anemia   Acute on chronic renal failure   Hypokalemia   Length of Stay: 6  SUBJECTIVE  Not much progress last 24 hours. Weight up 2 lb, still roughly 10 lb above estimated target "dry weight". Remains hyponatremic and slight worsening of renal function  CURRENT MEDS . apixaban  5 mg Oral BID  . atorvastatin  80 mg Oral Daily  . carvedilol  6.25 mg Oral BID WC  . doxycycline  100 mg Oral Q12H  . potassium chloride SA  40 mEq Oral BID  . saccharomyces boulardii  250 mg Oral BID  . sodium chloride  3 mL Intravenous Q12H  . spironolactone  12.5 mg Oral Daily    OBJECTIVE   Intake/Output Summary (Last 24 hours) at 07/14/15 1057 Last data filed at 07/14/15 0909  Gross per 24 hour  Intake   1068 ml  Output   1200 ml  Net   -132 ml   Filed Weights   07/12/15 0523 07/13/15 0449 07/14/15 0505  Weight: 301 lb 12.8 oz (136.896 kg) 294 lb 4.8 oz (133.494 kg) 296 lb 3.2 oz (134.355 kg)    PHYSICAL EXAM Filed Vitals:   07/13/15 1400 07/13/15 2112 07/14/15 0505 07/14/15 0958  BP: 98/57 119/57 100/52 114/58  Pulse: 73 78 58 77  Temp: 98.5 F (36.9 C) 98.3 F (36.8 C) 98 F (36.7 C) 98.5 F (36.9 C)  TempSrc: Oral Oral Oral Oral  Resp: Height:      Weight:   296 lb 3.2 oz (134.355 kg)   SpO2: 100% 100% 98% 97%   General: Alert, oriented x3, no distress Head: no evidence of trauma, PERRL, EOMI, no exophtalmos or lid lag, no myxedema, no xanthelasma; normal ears, nose and oropharynx Neck: unable to see the jugular venous pulsations clearly, but probably to angle of jaw; brisk carotid pulses without delay and no carotid bruits Chest: clear to auscultation, no signs of consolidation by percussion or palpation, normal fremitus,  symmetrical and full respiratory excursions Cardiovascular: unable to find the apical impulse, regular rhythm, normal first and second heart sounds, no rubs or gallops, no murmur Abdomen: no tenderness or distention, no masses by palpation, no abnormal pulsatility or arterial bruits, normal bowel sounds, no hepatosplenomegaly Extremities: no clubbing, cyanosis; massive chronic bilateral calf edema, but not weeping, ACE wraps; 2+ radial, ulnar and brachial pulses bilaterally; 2+ right femoral, posterior tibial and dorsalis pedis pulses; 2+ left femoral, posterior tibial and dorsalis pedis pulses; no subclavian or femoral bruits Neurological: grossly nonfocal  LABS  CBC  Recent Labs  07/13/15 0411 07/14/15 0331  WBC 9.5 7.9  NEUTROABS 7.7 5.5  HGB 11.2* 11.5*  HCT 34.7* 35.7*  MCV 85.7 85.6  PLT 376 385   Basic Metabolic Panel  Recent Labs  07/13/15 0940 07/14/15 0331  NA 129* 128*  K 4.0 4.1  CL 88* 82*  CO2 29 31  GLUCOSE 155* 110*  BUN 40* 52*  CREATININE 1.65* 1.63*  CALCIUM 9.3 9.5   Liver Function Tests  Radiology Studies Imaging results have been reviewed and No results found.  TELE atrial fibrillation, rate controlled    ASSESSMENT AND PLAN: Acute on chronic diastolic HF Hyponatremia Acute on chronic renal failure Permanent atrial fibrillation Morbid  obesity Alcohol use/abuse  Discussed with Dr. Shirlee Latch. Will try to push diuresis one more day with metolazone and switch to PO torsemide tomorrow. Fluid restriction   Thurmon Fair, MD, [facility] [phone] office [phone] pager 07/14/2015 10:57 AM

## 2015-07-15 DIAGNOSIS — E871 Hypo-osmolality and hyponatremia: Secondary | ICD-10-CM

## 2015-07-15 LAB — BASIC METABOLIC PANEL
Anion gap: 13 (ref 5–15)
BUN: 63 mg/dL — AB (ref 6–20)
CHLORIDE: 83 mmol/L — AB (ref 101–111)
CO2: 33 mmol/L — AB (ref 22–32)
Calcium: 9.4 mg/dL (ref 8.9–10.3)
Creatinine, Ser: 1.46 mg/dL — ABNORMAL HIGH (ref 0.61–1.24)
GFR calc non Af Amer: 47 mL/min — ABNORMAL LOW (ref >60)
GFR, EST AFRICAN AMERICAN: 55 mL/min — AB (ref >60)
Glucose, Bld: 97 mg/dL (ref 65–99)
POTASSIUM: 3.2 mmol/L — AB (ref 3.5–5.1)
Sodium: 129 mmol/L — ABNORMAL LOW (ref 135–145)

## 2015-07-15 LAB — CBC WITH DIFFERENTIAL/PLATELET
Basophils Absolute: 0.1 10*3/uL (ref 0.0–0.1)
Basophils Relative: 1 % (ref 0–1)
EOS ABS: 0.7 10*3/uL (ref 0.0–0.7)
Eosinophils Relative: 8 % — ABNORMAL HIGH (ref 0–5)
HCT: 35.6 % — ABNORMAL LOW (ref 39.0–52.0)
Hemoglobin: 11.8 g/dL — ABNORMAL LOW (ref 13.0–17.0)
Lymphocytes Relative: 7 % — ABNORMAL LOW (ref 12–46)
Lymphs Abs: 0.6 10*3/uL — ABNORMAL LOW (ref 0.7–4.0)
MCH: 28.3 pg (ref 26.0–34.0)
MCHC: 33.1 g/dL (ref 30.0–36.0)
MCV: 85.4 fL (ref 78.0–100.0)
Monocytes Absolute: 1.2 10*3/uL — ABNORMAL HIGH (ref 0.1–1.0)
Monocytes Relative: 15 % — ABNORMAL HIGH (ref 3–12)
NEUTROS PCT: 69 % (ref 43–77)
Neutro Abs: 5.9 10*3/uL (ref 1.7–7.7)
Platelets: 386 10*3/uL (ref 150–400)
RBC: 4.17 MIL/uL — ABNORMAL LOW (ref 4.22–5.81)
RDW: 14.3 % (ref 11.5–15.5)
WBC: 8.5 10*3/uL (ref 4.0–10.5)

## 2015-07-15 MED ORDER — METOLAZONE 5 MG PO TABS
5.0000 mg | ORAL_TABLET | Freq: Every day | ORAL | Status: DC
  Administered 2015-07-15 – 2015-07-16 (×2): 5 mg via ORAL
  Filled 2015-07-15 (×3): qty 1

## 2015-07-15 MED ORDER — TORSEMIDE 20 MG PO TABS
80.0000 mg | ORAL_TABLET | Freq: Every day | ORAL | Status: DC
  Administered 2015-07-15 – 2015-07-16 (×2): 80 mg via ORAL
  Filled 2015-07-15 (×3): qty 4

## 2015-07-15 MED ORDER — POTASSIUM CHLORIDE CRYS ER 20 MEQ PO TBCR
40.0000 meq | EXTENDED_RELEASE_TABLET | Freq: Once | ORAL | Status: AC
  Administered 2015-07-15: 40 meq via ORAL
  Filled 2015-07-15: qty 2

## 2015-07-15 NOTE — Progress Notes (Signed)
Patient alert and oriented, denies pain, no shortness of breath, Afib on the monitor. Will continue to monitor patient.

## 2015-07-15 NOTE — Progress Notes (Signed)
[facility] PROGRESS NOTE Interim History: 69 year old with history of obesity, chronic atrial fibrillation, resistant HTN, OA and diastolic CHF admitted with volume overload and suspected RLE cellulitis. HF team on board.  Filed Weights   07/13/15 0449 07/14/15 0505 07/15/15 0503  Weight: 133.494 kg (294 lb 4.8 oz) 134.355 kg (296 lb 3.2 oz) 133.221 kg (293 lb 11.2 oz)        Intake/Output Summary (Last 24 hours) at 07/15/15 1253 Last data filed at 07/15/15 1610  Gross per 24 hour  Intake   1385 ml  Output   1000 ml  Net    385 ml   Assessment/Plan: 1-acute on chronic diastolic heart failure -will continue unna boots -Lasix IV Gtt 20 discontinue 8-7 -weight slowly trending down. 133 -HF team following.  -on spirolactone, and coreg.  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   6-morbid obesity -Body mass index is 42.14 kg/(m^2). 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  -renal function stable. Cr decreasing: 1.6---1.4  8-Hypokalemia: -due to diuresis  -resolved. On supplement.   Code Status: Full Family Communication: no family at [place] Disposition Plan: home when medically stable. Plan for discharge 8-8 if renal function stable   Consultants:  HF team   Procedures: ECHO: none during this admission  Last echo with EF 55-60%  Antibiotics:  Doxycycline     HPI/Subjective: Denies dyspnea, chest pain. Edema LE decreasing,    Objective: Filed Vitals:   07/14/15 1503 07/14/15 2011 07/15/15 0503 07/15/15 0852  BP: 109/54 92/50 119/65 96/72  Pulse: 67 88 88 76  Temp: 98 F (36.7 C) 98.1 F (36.7 C) 97.5 F (36.4 C) 98.4 F (36.9 C)  TempSrc: Oral Oral Oral Oral  Resp: 20 18 18    Height:      Weight:   133.221 kg (293 lb 11.2 oz)   SpO2: 100% 100% 98% 97%     Exam:  General: Alert, awake, oriented x3, in no acute distress.  HEENT: No bruits, no goiter. No JVD Heart: Regular rate, without murmurs, rubs, gallops. Lungs: Good air movement, no wheezing or crackles Abdomen: Soft, nontender, nondistended, positive bowel sounds.  Neuro: Grossly intact, nonfocal. LE; bilateral unna boot.    Data Reviewed: Basic Metabolic Panel:  Recent Labs Lab 07/12/15 0318 07/13/15 0411 07/13/15 0940 07/14/15 0331 07/15/15 0456  NA 129* 130* 129* 128* 129*  K 4.4 3.9 4.0 4.1 3.2*  CL 92* 88* 88* 82* 83*  CO2 27 31 29 31  33*  GLUCOSE 100* 229* 155* 110* 97  BUN 33* 39* 40* 52* 63*  CREATININE 1.24 1.34* 1.65* 1.63* 1.46*  CALCIUM 9.1 9.4 9.3 9.5 9.4   Liver Function Tests:  Recent Labs Lab 07/08/15 1930 07/09/15 0250  AST 12* 12*  ALT 9* 8*  ALKPHOS 158* 150*  BILITOT 0.8 0.9  PROT 6.5 6.7  ALBUMIN 3.3* 3.3*   CBC:  Recent Labs Lab 07/11/15 0420 07/12/15 9604 07/13/15 0411 07/14/15 0331 07/15/15 5409  WBC 8.0 8.6 9.5 7.9 8.5  NEUTROABS 5.8 6.1 7.7 5.5 5.9  HGB 10.6* 10.8* 11.2* 11.5* 11.8*  HCT 33.3* 33.3* 34.7* 35.7* 35.6*  MCV 87.2 86.7 85.7 85.6 85.4  PLT 339 335 376 385 386   BNP (last 3 results)  Recent Labs  03/07/15 1500 04/02/15 1244 07/08/15 1948  BNP 401.4* 223.9* 222.1*    Studies: No results found.  Scheduled Meds: . apixaban  5 mg Oral BID  . atorvastatin  80 mg Oral Daily  . carvedilol  6.25 mg Oral BID WC  . doxycycline  100 mg Oral Q12H  . metolazone  5 mg Oral Daily  . potassium  chloride SA  40 mEq Oral BID  . saccharomyces boulardii  250 mg Oral BID  . sodium chloride  3 mL Intravenous Q12H  . spironolactone  12.5 mg Oral Daily  . torsemide  80 mg Oral Daily   Continuous Infusions:    Time: 30 minutes  Jesus [name] A  [facility] Pager [phone]. If 7PM-7AM, please contact night-coverage at [email or web address], password [facility] 07/15/2015, 12:53 PM  LOS: 7 days

## 2015-07-15 NOTE — Progress Notes (Signed)
Patient Name: Jesus [name] Date of Encounter: 07/15/2015  Patient Care Team: Gerre Pebbles, [name] as PCP - General (Physician Assistant) Laurey Morale, MD as Consulting Physician (Cardiology)   PROBLEM LIST  Principal Problem:   Acute on chronic diastolic CHF (congestive heart failure) Active Problems:   Hypertension   Chronic atrial fibrillation   Chronic anemia   Acute on chronic renal failure   Hypokalemia   Hyponatremia   Cellulitis of right lower extremity     SUBJECTIVE  No chest pain. Breathing stable. Scrotal edema slightly reduced.  CURRENT MEDS . apixaban  5 mg Oral BID  . atorvastatin  80 mg Oral Daily  . carvedilol  6.25 mg Oral BID WC  . doxycycline  100 mg Oral Q12H  . metolazone  5 mg Oral Daily  . potassium chloride SA  40 mEq Oral BID  . potassium chloride  40 mEq Oral Once  . saccharomyces boulardii  250 mg Oral BID  . sodium chloride  3 mL Intravenous Q12H  . spironolactone  12.5 mg Oral Daily  . torsemide  80 mg Oral Daily    OBJECTIVE  Filed Vitals:   07/14/15 0958 07/14/15 1503 07/14/15 2011 07/15/15 0503  BP: 114/58 109/54 92/50 119/65  Pulse: 77 67 88 88  Temp: 98.5 F (36.9 C) 98 F (36.7 C) 98.1 F (36.7 C) 97.5 F (36.4 C)  TempSrc: Oral Oral Oral Oral  Resp: Height:      Weight:    293 lb 11.2 oz (133.221 kg)  SpO2: 97% 100% 100% 98%    Intake/Output Summary (Last 24 hours) at 07/15/15 0825 Last data filed at 07/15/15 0500  Gross per 24 hour  Intake 1694.67 ml  Output   1000 ml  Net 694.67 ml   Filed Weights   07/13/15 0449 07/14/15 0505 07/15/15 0503  Weight: 294 lb 4.8 oz (133.494 kg) 296 lb 3.2 oz (134.355 kg) 293 lb 11.2 oz (133.221 kg)    PHYSICAL EXAM  GEN: Well nourished, well developed, in no acute distress. HEENT: normal. Neck: Unable to assess JVD  Cardiac: Irregularly irregular rhythm, no murmurs, rubs, or gallops. Legs wrapped     Respiratory:  Decreased breath sounds  bilaterally. GI: distended MS: no deformity or atrophy. Skin: warm and dry  Neuro:  Strength and sensation are intact. Psych: Normal affect.  Accessory Clinical Findings  CBC  Recent Labs  07/14/15 0331 07/15/15 0456  WBC 7.9 8.5  NEUTROABS 5.5 5.9  HGB 11.5* 11.8*  HCT 35.7* 35.6*  MCV 85.6 85.4  PLT 385 386   Basic Metabolic Panel  Recent Labs  07/14/15 0331 07/15/15 0456  NA 128* 129*  K 4.1 3.2*  CL 82* 83*  CO2 31 33*  GLUCOSE 110* 97  BUN 52* 63*  CREATININE 1.63* 1.46*  CALCIUM 9.5 9.4   Liver Function Tests No results for input(s): AST, ALT, ALKPHOS, BILITOT, PROT, ALBUMIN in the last 72 hours. No results for input(s): LIPASE, AMYLASE in the last 72 hours. Cardiac Enzymes No results for input(s): CKTOTAL, CKMB, CKMBINDEX, TROPONINI in the last 72 hours. BNP (last 3 results)  Recent Labs  03/07/15 1500 04/02/15 1244 07/08/15 1948  BNP 401.4* 223.9* 222.1*   D-Dimer No results for input(s): DDIMER in the last 72 hours. Hemoglobin A1C No results for input(s): HGBA1C in the last 72 hours. Fasting Lipid Panel No results for input(s): CHOL, HDL, LDLCALC, TRIG, CHOLHDL, LDLDIRECT in the last 72  hours. Thyroid Function Tests No results for input(s): TSH, T4TOTAL, T3FREE, THYROIDAB in the last 72 hours.  Invalid input(s): FREET3  TELE  AF, HR 80s  ECG     RADIOLOGY/STUDIES  Dg Chest 2 View  07/08/2015   CLINICAL DATA:  Bilateral lower extremity swelling for 3 days.  EXAM: CHEST  2 VIEW  COMPARISON:  01/04/2015  FINDINGS: There is marked cardiomegaly, unchanged. There is mild vascular congestion, unchanged. No alveolar edema or infiltrate. No large effusion.  IMPRESSION: Unchanged cardiomegaly. Mild vascular congestion, also unchanged and possibly chronic.   Electronically Signed   By: Ellery Plunk M.D.   On: 07/08/2015 21:37    PATIENT SUMMARY  69 year old male with obesity, chronic AF, resistant HTN, diastolic HF. He presented to the  hospital on the date of admission with increased leg and scrotal edema. He was admitted with volume overload and suspected RLE cellulitis.  He has been slow to diurese.  He has been maintained on Lasix gtt and Metolazone.  ASSESSMENT AND PLAN  1. A/C Diastolic HF- Most recent ECHO 12/6107 EF 55-60% . NYHA III.   - I/O skewed by incontinence.  Wt down since 8/1 (305 >> 293 lbs today).  Down 3lbs since yesterday.  - K+ low >> will replace today.  - Will DC Lasix gtt and resume Torsemide 80 mg bid today (home dose 60 mg bid) and continuing Metolazone 5 mg QD.    - check BMET in AM. 2. RLE suspected cellulitis: per IM  3. A Fib Chronic - ChadsVasc Score= 3.   - Rate controlled with carvedilol. Continue 6.25 mg bid with low BPs. On eliquis 5 mg twice a day  4.HTN - Some hypotension recently.  Amlodipine and lisinopril on hold for now. 5. Hyponatremia- Na 129. Stable.  Continue to fluid restrict.  6. AKI on CKD stage 2:   - Cr 1.22 -> 1.46 -> 1.36 -> 1.24 -> 1.34-> 1.65 -> 1.63 -> 1.46 (Cr stable)  - Baseline 1.10 3 months ago.   - Holding lisinopril since 07/10/15   Signed, Tereso Newcomer, [name]  07/15/2015, 8:25 AM     I have seen and examined the patient along with Tereso Newcomer, [name] .  I have reviewed the chart, notes and new data.  I agree with PA's note.  Key new complaints: feels fairly comfortable, not far from his baseline Key examination changes: still with prominent leg edema, but weight has decreased further Key new findings / data: stable renal function  PLAN: Switch to PO torsemide and add daily metolazone, monitor another 24 hours before DC.  Thurmon Fair, MD, [facility] [phone] 07/15/2015, 8:35 AM

## 2015-07-16 LAB — BASIC METABOLIC PANEL
Anion gap: 14 (ref 5–15)
BUN: 71 mg/dL — ABNORMAL HIGH (ref 6–20)
CALCIUM: 9.8 mg/dL (ref 8.9–10.3)
CO2: 32 mmol/L (ref 22–32)
CREATININE: 1.67 mg/dL — AB (ref 0.61–1.24)
Chloride: 79 mmol/L — ABNORMAL LOW (ref 101–111)
GFR calc non Af Amer: 40 mL/min — ABNORMAL LOW (ref >60)
GFR, EST AFRICAN AMERICAN: 47 mL/min — AB (ref >60)
GLUCOSE: 113 mg/dL — AB (ref 65–99)
Potassium: 4 mmol/L (ref 3.5–5.1)
Sodium: 125 mmol/L — ABNORMAL LOW (ref 135–145)

## 2015-07-16 LAB — CBC WITH DIFFERENTIAL/PLATELET
BASOS ABS: 0.1 10*3/uL (ref 0.0–0.1)
BASOS PCT: 1 % (ref 0–1)
EOS ABS: 0.7 10*3/uL (ref 0.0–0.7)
Eosinophils Relative: 7 % — ABNORMAL HIGH (ref 0–5)
HCT: 35 % — ABNORMAL LOW (ref 39.0–52.0)
HEMOGLOBIN: 11.8 g/dL — AB (ref 13.0–17.0)
LYMPHS ABS: 0.8 10*3/uL (ref 0.7–4.0)
Lymphocytes Relative: 8 % — ABNORMAL LOW (ref 12–46)
MCH: 28.6 pg (ref 26.0–34.0)
MCHC: 33.7 g/dL (ref 30.0–36.0)
MCV: 85 fL (ref 78.0–100.0)
MONOS PCT: 12 % (ref 3–12)
Monocytes Absolute: 1.1 10*3/uL — ABNORMAL HIGH (ref 0.1–1.0)
NEUTROS ABS: 7 10*3/uL (ref 1.7–7.7)
Neutrophils Relative %: 72 % (ref 43–77)
Platelets: 403 10*3/uL — ABNORMAL HIGH (ref 150–400)
RBC: 4.12 MIL/uL — AB (ref 4.22–5.81)
RDW: 14.3 % (ref 11.5–15.5)
WBC: 9.6 10*3/uL (ref 4.0–10.5)

## 2015-07-16 MED ORDER — OXYCODONE-ACETAMINOPHEN 5-325 MG PO TABS
2.0000 | ORAL_TABLET | Freq: Once | ORAL | Status: AC
  Administered 2015-07-16: 2 via ORAL
  Filled 2015-07-16: qty 2

## 2015-07-16 MED ORDER — OXYCODONE-ACETAMINOPHEN 5-325 MG PO TABS
1.0000 | ORAL_TABLET | Freq: Four times a day (QID) | ORAL | Status: DC | PRN
  Administered 2015-07-17: 2 via ORAL
  Filled 2015-07-16 (×2): qty 2

## 2015-07-16 NOTE — Progress Notes (Signed)
Orthopedic Tech Progress Note Patient Details:  Jesus [name] May 24, 1946 [id number]  Ortho Devices Type of Ortho Device: Roland Rack boot Ortho Device/Splint Location: bilateral unna boots Ortho Device/Splint Interventions: Application   Jesus [name], Jesus [name] 07/16/2015, 2:40 PM

## 2015-07-16 NOTE — Care Management Important Message (Signed)
Important Message  Patient Details  Name: Jesus [name] MRN: [id number] Date of Birth: 1946-08-29   Medicare Important Message Given:  Yes-third notification given    [name] 07/16/2015, 2:47 PM

## 2015-07-16 NOTE — Progress Notes (Signed)
[facility] PROGRESS NOTE Interim History: 69 year old with history of obesity, chronic atrial fibrillation, resistant HTN, OA and diastolic CHF admitted with volume overload and suspected RLE cellulitis. HF team on board.  Filed Weights   07/14/15 0505 07/15/15 0503 07/16/15 0555  Weight: 134.355 kg (296 lb 3.2 oz) 133.221 kg (293 lb 11.2 oz) 133.182 kg (293 lb 9.8 oz)        Intake/Output Summary (Last 24 hours) at 07/16/15 1357 Last data filed at 07/16/15 1355  Gross per 24 hour  Intake    840 ml  Output   1100 ml  Net   -260 ml   Assessment/Plan: 1-acute on chronic diastolic heart failure -will continue unna boots -Lasix IV Gtt 20 discontinue 8-7 -weight slowly trending down. Stable at 133 -HF team following.  -on spirolactone, and coreg.  -Continue with metolazone -Started on torsemide 8-7 -Sodium level lower today, renal function worse. He received torsemide already. Will follow HF recommendation.   2-Chronic atrial fibrillation: rate controlled -continue low dose coreg and eliquis  3-Chronic anemia: stable and w/o signs of overt bleeding -will monitor -most likely due to AOCD  4-Hypertension: with hypotension - d/c lisinopril and amlodipine -Coreg adjusted down to 6.25 -will monitor VS  5-presumed superimposed cellulitis -will continue treatment with doxycycline (plan is for 10 days total) Day 6.   6-morbid obesity -Body mass index is 42.13 kg/(m^2). 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  -renal function stable. Cr decreasing: 1.6---1.4--1.6.  8-Hypokalemia: -due to diuresis  -resolved. On supplement.   Code Status: Full Family Communication: no family at [place] Disposition Plan: home when medically stable. Plan for discharge 8-8 if renal function  stable   Consultants:  HF team   Procedures: ECHO: none during this admission  Last echo with EF 55-60%  Antibiotics:  Doxycycline   HPI/Subjective: No new complaints. Swelling going down.    Objective: Filed Vitals:   07/15/15 1439 07/15/15 2124 07/16/15 0555 07/16/15 0809  BP: 130/54 111/61 132/68 114/64  Pulse: 71 77 75 75  Temp: 98.1 F (36.7 C) 97.9 F (36.6 C) 97.7 F (36.5 C)   TempSrc: Oral Oral Oral   Resp: Height:      Weight:   133.182 kg (293 lb 9.8 oz)   SpO2: 100% 97% 100%      Exam:  General: Alert, awake, oriented x3, in no acute distress.  HEENT: No bruits, no goiter. No JVD Heart: Regular rate, without murmurs, rubs, gallops. Lungs: Good air movement, no wheezing or crackles Abdomen: Soft, nontender, nondistended, positive bowel sounds.  Neuro: Grossly intact, nonfocal. LE; bilateral unna boot.    Data Reviewed: Basic Metabolic Panel:  Recent Labs Lab 07/13/15 0411 07/13/15 0940 07/14/15 0331 07/15/15 0456 07/16/15 0406  NA 130* 129* 128* 129* 125*  K 3.9 4.0 4.1 3.2* 4.0  CL 88* 88* 82* 83* 79*  CO2 33* 32  GLUCOSE 229* 155* 110* 97 113*  BUN 39* 40* 52* 63* 71*  CREATININE 1.34* 1.65* 1.63* 1.46* 1.67*  CALCIUM 9.4 9.3 9.5 9.4 9.8   Liver Function Tests: No results for input(s): AST, ALT, ALKPHOS, BILITOT, PROT, ALBUMIN in the last 168 hours. CBC:  Recent Labs Lab 07/12/15 0318 07/13/15 0411 07/14/15 0331 07/15/15 0456 07/16/15 0406  WBC 8.6 9.5 7.9 8.5  9.6  NEUTROABS 6.1 7.7 5.5 5.9 7.0  HGB 10.8* 11.2* 11.5* 11.8* 11.8*  HCT 33.3* 34.7* 35.7* 35.6* 35.0*  MCV 86.7 85.7 85.6 85.4 85.0  PLT 335 376 385 386 403*   BNP (last 3 results)  Recent Labs  03/07/15 1500 04/02/15 1244 07/08/15 1948  BNP 401.4* 223.9* 222.1*    Studies: No results found.  Scheduled Meds: . apixaban  5 mg Oral BID  . atorvastatin  80 mg Oral Daily  . carvedilol  6.25 mg Oral BID WC  . doxycycline  100 mg  Oral Q12H  . metolazone  5 mg Oral Daily  . potassium chloride SA  40 mEq Oral BID  . saccharomyces boulardii  250 mg Oral BID  . sodium chloride  3 mL Intravenous Q12H  . spironolactone  12.5 mg Oral Daily  . torsemide  80 mg Oral Daily   Continuous Infusions:    Time: 30 minutes  Regalado, Belkys A  [facility] Pager [phone]. If 7PM-7AM, please contact night-coverage at [email or web address], password [facility] 07/16/2015, 1:57 PM  LOS: 8 days

## 2015-07-16 NOTE — Progress Notes (Signed)
Advanced Heart Failure Rounding Note  PCP: Dr Sedalia Muta Primary HF Cardiologist: Dr Shirlee Latch  Subjective:    Admitted to [facility] with increased leg and scrotal edema.   Feels better. Switched to po torsemide and metolazone yesterday. Weight stable overnight. Down 12 pounds from admit. Creatinine relatively stable at 1.67   Objective:   Weight Range: 133.182 kg (293 lb 9.8 oz) Body mass index is 42.13 kg/(m^2).   Vital Signs:   Temp:  [97.7 F (36.5 C)-98.1 F (36.7 C)] 98.1 F (36.7 C) (08/08 1423) Pulse Rate:  [72-77] 72 (08/08 1423) Resp:  [18-20] 18 (08/08 1423) BP: (105-132)/(50-68) 105/50 mmHg (08/08 1423) SpO2:  [97 %-100 %] 100 % (08/08 1423) Weight:  [133.182 kg (293 lb 9.8 oz)] 133.182 kg (293 lb 9.8 oz) (08/08 0555) Last BM Date: 07/15/15  Weight change: Filed Weights   07/14/15 0505 07/15/15 0503 07/16/15 0555  Weight: 134.355 kg (296 lb 3.2 oz) 133.221 kg (293 lb 11.2 oz) 133.182 kg (293 lb 9.8 oz)    Intake/Output:   Intake/Output Summary (Last 24 hours) at 07/16/15 1809 Last data filed at 07/16/15 1421  Gross per 24 hour  Intake   1080 ml  Output   1100 ml  Net    -20 ml     Physical Exam: General: obese male No resp difficulty. Sitting in chair.  HEENT: normal Neck: supple. JVP hard to see. Carotids 2+ bilat; no bruits. No lymphadenopathy or thryomegaly appreciated. Cor: PMI nondisplaced. Irregular rate & rhythm. No rubs, gallops or murmurs. Lungs: clear Abdomen: soft, nontender, nondistended. No hepatosplenomegaly. No bruits or masses. Good bowel sounds. Extremities: no cyanosis, clubbing, rash, RLE and LLE with UNNA boots in place. Edema much improved.  Neuro: alert & orientedx3, cranial nerves grossly intact. moves all 4 extremities w/o difficulty. Affect pleasant  Telemetry:  A fib 80s  Labs: CBC  Recent Labs  07/15/15 0456 07/16/15 0406  WBC 8.5 9.6  NEUTROABS 5.9 7.0  HGB 11.8* 11.8*  HCT 35.6* 35.0*  MCV 85.4 85.0  PLT 386 403*    Basic Metabolic Panel  Recent Labs  07/15/15 0456 07/16/15 0406  NA 129* 125*  K 3.2* 4.0  CL 83* 79*  CO2 33* 32  GLUCOSE 97 113*  BUN 63* 71*  CALCIUM 9.4 9.8   Liver Function Tests No results for input(s): AST, ALT, ALKPHOS, BILITOT, PROT, ALBUMIN in the last 72 hours. No results for input(s): LIPASE, AMYLASE in the last 72 hours. Cardiac Enzymes No results for input(s): CKTOTAL, CKMB, CKMBINDEX, TROPONINI in the last 72 hours.  BNP: BNP (last 3 results)  Recent Labs  03/07/15 1500 04/02/15 1244 07/08/15 1948  BNP 401.4* 223.9* 222.1*    ProBNP (last 3 results) No results for input(s): PROBNP in the last 8760 hours.   D-Dimer No results for input(s): DDIMER in the last 72 hours. Hemoglobin A1C No results for input(s): HGBA1C in the last 72 hours. Fasting Lipid Panel No results for input(s): CHOL, HDL, LDLCALC, TRIG, CHOLHDL, LDLDIRECT in the last 72 hours. Thyroid Function Tests No results for input(s): TSH, T4TOTAL, T3FREE, THYROIDAB in the last 72 hours.  Invalid input(s): FREET3  Other results:     Imaging/Studies:  No results found.  Latest Echo  Latest Cath   Medications:     Scheduled Medications: . apixaban  5 mg Oral BID  . atorvastatin  80 mg Oral Daily  . carvedilol  6.25 mg Oral BID WC  . doxycycline  100 mg Oral Q12H  .  metolazone  5 mg Oral Daily  . potassium chloride SA  40 mEq Oral BID  . saccharomyces boulardii  250 mg Oral BID  . sodium chloride  3 mL Intravenous Q12H  . spironolactone  12.5 mg Oral Daily  . torsemide  80 mg Oral Daily    Infusions:    PRN Medications: acetaminophen **OR** acetaminophen, ondansetron **OR** ondansetron (ZOFRAN) IV   Assessment/Plan   Mr Yaffe is 69 year old admitted volume overload and suspected cellulitis.  1. A/C Diastolic HF- EF 55-60% . NYHA III.  2. RLE suspected cellulitis - on doxycyline.  3. A Fib Chronic - ChadsVasc Score= 3.  - Rate controlled with  carvedilol.   On eliquis 5 mg twice a day  4. HTN -  5. Hyponatremia-.  6. AKI on CKD stage 3. - Cr 1.22 -> 1.46 -> 1.36 -> 1.24 -> 1.34. -> 1.65 -> 1.63 -1.67  Overall much improved. Renal function up from baseline but stable. If renal function remains stable overnight should be ready for d/c in am. Will need to be carefull with daily metolazone. May be best to use 2 or 3x per week.   Length of Stay: 8  Bensimhon, Daniel MD 07/16/2015, 6:09 PM  Advanced Heart Failure Team Pager [phone] (M-F; 7a - 4p)  Please contact [facility] for night-coverage after hours (4p -7a ) and weekends on [email or web address]
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
## 2015-07-16 NOTE — Progress Notes (Signed)
Physical Therapy Treatment Patient Details Name: TAOS TAPP MRN: [id number] DOB: 04-03-1946 Today's Date: 07/16/2015    History of Present Illness 69 year old with history of obesity, chronic atrial fibrillation, resistant HTN, OA and diastolic CHF admitted with volume overload and suspected RLE cellulitis.    PT Comments    Pt limited today due to pain in L knee.  Pt states that the pain changes places in his leg.  He was able to stand for several minutes and then sat back down and was setup with lunch.  Will attempt gait again next session.  Follow Up Recommendations  Outpatient PT     Equipment Recommendations  Rolling walker with 5" wheels (if he doesn't own one)    Recommendations for Other Services       Precautions / Restrictions Precautions Precautions: Fall Precaution Comments: B unna boots Restrictions Weight Bearing Restrictions: No    Mobility  Bed Mobility               General bed mobility comments: pt up in recliner upon arrival  Transfers Overall transfer level: Needs assistance Equipment used: Rolling walker (2 wheeled) Transfers: Sit to/from Stand Sit to Stand: Supervision         General transfer comment: Donned front gown (as pt only had t shirt on), then pt stood to RW and donned back gown.  Pt stood for several minutes and reported that his L lateral knee was bothering him and he didn't think he could walk with PT at this time. Sat back down and adjusted in chair as needed for comfort due to swelling in scrotum.  Ambulation/Gait             General Gait Details: Pt declined gait today.   Stairs            Wheelchair Mobility    Modified Rankin (Stroke Patients Only)       Balance             Standing balance-Leahy Scale: Poor Standing balance comment: required UE A due to L knee pain.                    Cognition Arousal/Alertness: Awake/alert Behavior During Therapy: WFL for tasks  assessed/performed Overall Cognitive Status: Within Functional Limits for tasks assessed                      Exercises      General Comments        Pertinent Vitals/Pain Pain Assessment: 0-10 Pain Score: 6  Pain Location: L knee Pain Descriptors / Indicators: Aching Pain Intervention(s): Limited activity within patient's tolerance;Monitored during session;Repositioned    Home Living                      Prior Function            PT Goals (current goals can now be found in the care plan section) Acute Rehab PT Goals Patient Stated Goal: return home, have legs get better PT Goal Formulation: With patient Time For Goal Achievement: 07/25/15 Potential to Achieve Goals: Good Progress towards PT goals: Not progressing toward goals - comment (limited today due to L knee pain)    Frequency  Min 3X/week    PT Plan Discharge plan needs to be updated    Co-evaluation             End of Session Equipment Utilized During Treatment: Gait  belt Activity Tolerance: Patient limited by pain Patient left: in chair;with call bell/phone within reach     Time: 1257-1309 PT Time Calculation (min) (ACUTE ONLY): 12 min  Charges:  $Therapeutic Activity: 8-22 mins                    G Codes:      Herminia Warren LUBECK 07/16/2015, 2:43 PM

## 2015-07-16 NOTE — Progress Notes (Signed)
Pt. C/o pain to his bottom 9/10. Pt. Also pulling at his Neomia Dear boot stating he is taking it off. Text page sent to On call NP, K. Schorr. RN will continue to monitor pt. Devlin Brink, Cheryll Dessert

## 2015-07-17 LAB — CBC WITH DIFFERENTIAL/PLATELET
Basophils Absolute: 0 10*3/uL (ref 0.0–0.1)
Basophils Relative: 0 % (ref 0–1)
EOS ABS: 0.5 10*3/uL (ref 0.0–0.7)
EOS PCT: 6 % — AB (ref 0–5)
HCT: 36 % — ABNORMAL LOW (ref 39.0–52.0)
Hemoglobin: 11.8 g/dL — ABNORMAL LOW (ref 13.0–17.0)
Lymphocytes Relative: 9 % — ABNORMAL LOW (ref 12–46)
Lymphs Abs: 0.8 10*3/uL (ref 0.7–4.0)
MCH: 28 pg (ref 26.0–34.0)
MCHC: 32.8 g/dL (ref 30.0–36.0)
MCV: 85.5 fL (ref 78.0–100.0)
MONOS PCT: 13 % — AB (ref 3–12)
Monocytes Absolute: 1.2 10*3/uL — ABNORMAL HIGH (ref 0.1–1.0)
Neutro Abs: 6.5 10*3/uL (ref 1.7–7.7)
Neutrophils Relative %: 72 % (ref 43–77)
Platelets: 381 10*3/uL (ref 150–400)
RBC: 4.21 MIL/uL — AB (ref 4.22–5.81)
RDW: 14.4 % (ref 11.5–15.5)
WBC: 9.1 10*3/uL (ref 4.0–10.5)

## 2015-07-17 LAB — BASIC METABOLIC PANEL
Anion gap: 11 (ref 5–15)
BUN: 86 mg/dL — ABNORMAL HIGH (ref 6–20)
CALCIUM: 9.8 mg/dL (ref 8.9–10.3)
CO2: 34 mmol/L — ABNORMAL HIGH (ref 22–32)
CREATININE: 1.95 mg/dL — AB (ref 0.61–1.24)
Chloride: 82 mmol/L — ABNORMAL LOW (ref 101–111)
GFR calc non Af Amer: 33 mL/min — ABNORMAL LOW (ref >60)
GFR, EST AFRICAN AMERICAN: 39 mL/min — AB (ref >60)
Glucose, Bld: 110 mg/dL — ABNORMAL HIGH (ref 65–99)
Potassium: 4.5 mmol/L (ref 3.5–5.1)
Sodium: 127 mmol/L — ABNORMAL LOW (ref 135–145)

## 2015-07-17 MED ORDER — POTASSIUM CHLORIDE CRYS ER 20 MEQ PO TBCR
40.0000 meq | EXTENDED_RELEASE_TABLET | Freq: Two times a day (BID) | ORAL | Status: DC
  Administered 2015-07-18: 40 meq via ORAL
  Filled 2015-07-17 (×2): qty 2

## 2015-07-17 NOTE — Progress Notes (Signed)
[facility] PROGRESS NOTE Interim History: 69 year old with history of obesity, chronic atrial fibrillation, resistant HTN, OA and diastolic CHF admitted with volume overload and suspected RLE cellulitis. HF team on board.  Filed Weights   07/15/15 0503 07/16/15 0555 07/17/15 0453  Weight: 133.221 kg (293 lb 11.2 oz) 133.182 kg (293 lb 9.8 oz) 132.541 kg (292 lb 3.2 oz)        Intake/Output Summary (Last 24 hours) at 07/17/15 1602 Last data filed at 07/17/15 1406  Gross per 24 hour  Intake   1080 ml  Output    200 ml  Net    880 ml   Assessment/Plan: 1-acute on chronic diastolic heart failure -will continue unna boots -Lasix IV Gtt 20 discontinue 8-7 -weight slowly trending down. Weight today at 132. -HF team following.  -on spirolactone, and coreg.  -hold metolazone and torsemide due to increase in Cr.   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   6-morbid obesity -Body mass index is 41.93 kg/(m^2). 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  -cr increased to 1.9. Holding diuretics.   8-Hypokalemia: -due to diuresis  -resolved. On supplement.   Code Status: Full Family Communication: no family at [place] Disposition Plan: home when renal function improved.    Consultants:  HF team   Procedures: ECHO: none during this admission  Last echo with EF 55-60%  Antibiotics:  Doxycycline   HPI/Subjective: Feeling  frustrated, because he is still in the hospital, and now his Kidney function is bad.    Objective: Filed Vitals:   07/16/15 1423 07/16/15 2032 07/17/15 0453 07/17/15 1526  BP: 105/50 96/52 106/65 113/69  Pulse: 72 100 67 65  Temp: 98.1 F (36.7 C) 98.7 F (37.1 C) 97.9 F (36.6 C) 98.6 F (37 C)  TempSrc: Oral Oral Oral Oral  Resp: Height:      Weight:   132.541 kg (292 lb 3.2 oz)   SpO2: 100% 98% 98% 100%     Exam:  General: Alert, awake, oriented x3, in no acute distress.  HEENT: No bruits, no goiter. No JVD Heart: Regular rate, without murmurs, rubs, gallops. Lungs: Good air movement, no wheezing or crackles Abdomen: Soft, nontender, nondistended, positive bowel sounds.  Neuro: Grossly intact, nonfocal. LE; bilateral unna boot.    Data Reviewed: Basic Metabolic Panel:  Recent Labs Lab 07/13/15 0940 07/14/15 0331 07/15/15 0456 07/16/15 0406 07/17/15 0545  NA 129* 128* 129* 125* 127*  K 4.0 4.1 3.2* 4.0 4.5  CL 88* 82* 83* 79* 82*  CO2 29 31 33* 32 34*  GLUCOSE 155* 110* 97 113* 110*  BUN 40* 52* 63* 71* 86*  CREATININE 1.65* 1.63* 1.46* 1.67* 1.95*  CALCIUM 9.3 9.5 9.4 9.8 9.8   Liver Function Tests: No results for input(s): AST, ALT, ALKPHOS, BILITOT, PROT, ALBUMIN in the last 168 hours. CBC:  Recent Labs Lab 07/13/15 0411 07/14/15 0331 07/15/15 0456 07/16/15 0406 07/17/15 0545  WBC 9.5 7.9 8.5 9.6 9.1  NEUTROABS  7.7 5.5 5.9 7.0 6.5  HGB 11.2* 11.5* 11.8* 11.8* 11.8*  HCT 34.7* 35.7* 35.6* 35.0* 36.0*  MCV 85.7 85.6 85.4 85.0 85.5  PLT 376 385 386 403* 381   BNP (last 3 results)  Recent Labs  03/07/15 1500 04/02/15 1244 07/08/15 1948  BNP 401.4* 223.9* 222.1*    Studies: No results found.  Scheduled Meds: . apixaban  5 mg Oral BID  . atorvastatin  80 mg Oral Daily  . carvedilol  6.25 mg Oral BID WC  . doxycycline  100 mg Oral Q12H  . [START ON 07/18/2015] potassium chloride SA  40 mEq Oral BID  . saccharomyces  boulardii  250 mg Oral BID  . sodium chloride  3 mL Intravenous Q12H  . spironolactone  12.5 mg Oral Daily   Continuous Infusions:    Time: 30 minutes  Beth Goodlin A  [facility] Pager [phone]. If 7PM-7AM, please contact night-coverage at [email or web address], password [facility] 07/17/2015, 4:02 PM  LOS: 9 days

## 2015-07-17 NOTE — Progress Notes (Signed)
Pt. C/o pain to his BLE demanding that his una boots be removed. On call NP, K. Schorr aware.

## 2015-07-17 NOTE — Progress Notes (Signed)
Physical Therapy Treatment Patient Details Name: Jesus [name] MRN: [id number] DOB: [birth year] Today's Date: 07/17/2015    History of Present Illness 69 year old with history of obesity, chronic atrial fibrillation, resistant HTN, OA and diastolic CHF admitted with volume overload and suspected RLE cellulitis.    PT Comments    Pt able to ambulate today with RW distance that he will need to do at home with decreased L leg pain compared to yesterday. Pt needs frequent cues for task at hand despite cues.   Follow Up Recommendations  Outpatient PT     Equipment Recommendations  None recommended by PT    Recommendations for Other Services       Precautions / Restrictions Precautions Precautions: Fall Precaution Comments: B unna boots Restrictions Weight Bearing Restrictions: No    Mobility  Bed Mobility               General bed mobility comments: pt up in recliner upon arrival  Transfers Overall transfer level: Needs assistance Equipment used: Rolling walker (2 wheeled) Transfers: Sit to/from Stand Sit to Stand: Supervision         General transfer comment: slow transitional movement from sit to stand  Ambulation/Gait Ambulation/Gait assistance: Supervision Ambulation Distance (Feet): 50 Feet Assistive device: Rolling walker (2 wheeled) Gait Pattern/deviations: Antalgic;Decreased step length - right;Decreased step length - left;Wide base of support Gait velocity: decreased   General Gait Details: Cues to keep hands on RW instead of putting one on bed or counter.   Stairs            Wheelchair Mobility    Modified Rankin (Stroke Patients Only)       Balance     Sitting balance-Leahy Scale: Good       Standing balance-Leahy Scale: Poor Standing balance comment: requires UE support                    Cognition Arousal/Alertness: Awake/alert Behavior During Therapy: WFL for tasks assessed/performed Overall Cognitive Status:  Within Functional Limits for tasks assessed                      Exercises General Exercises - Lower Extremity Ankle Circles/Pumps: AROM;Both;10 reps;Seated Long Arc Quad: Strengthening;Both;Seated;10 reps Hip Flexion/Marching: Both;5 reps;Seated    General Comments General comments (skin integrity, edema, etc.): Pt without unna boot wrappings on today.  Donned large geriatric socks for gait.       Pertinent Vitals/Pain Pain Assessment: Faces Faces Pain Scale: Hurts even more Pain Location: L leg Pain Descriptors / Indicators: Aching;Other (Comment) (stiffness) Pain Intervention(s): Monitored during session;Repositioned    Home Living                      Prior Function            PT Goals (current goals can now be found in the care plan section) Acute Rehab PT Goals Patient Stated Goal: return home, have legs get better PT Goal Formulation: With patient Time For Goal Achievement: 07/25/15 Potential to Achieve Goals: Good Progress towards PT goals: Progressing toward goals    Frequency  Min 3X/week    PT Plan Current plan remains appropriate    Co-evaluation             End of Session Equipment Utilized During Treatment: Gait belt Activity Tolerance: Patient tolerated treatment well Patient left: in chair;with call bell/phone within reach     Time: 1135-1200 PT  Time Calculation (min) (ACUTE ONLY): 25 min  Charges:  $Gait Training: 23-37 mins                    G Codes:      Jakiera Ehler LUBECK 07/17/2015, 12:19 PM

## 2015-07-17 NOTE — Progress Notes (Signed)
At 0800Pt had requested to remove unna boots off BLE yesterday on nite shift.  Pt states this am that the wraps were hurting his legs and did not want another  One on.  May be tomorrow.  Will continue to monitor.  Amanda Pea, Charity fundraiser.

## 2015-07-17 NOTE — Progress Notes (Addendum)
Advanced Heart Failure Rounding Note  PCP: Dr Sedalia Muta Primary HF Cardiologist: Dr Shirlee Latch  Subjective:    Admitted to [facility] with increased leg and scrotal edema.   Feels fine symptomatically, says swelling is down and near/at normal. Switched to po torsemide and metolazone 07/15/15. Both being held currently with worsening creatinine. Weight down one pound. Down 13 pounds from admit. Creatinine worsened 1.67 -> 1.95.    Very frustrated to still be in hospital.  Does not understand why his kidney function is down.  Objective:   Weight Range: 292 lb 3.2 oz (132.541 kg) Body mass index is 41.93 kg/(m^2).   Vital Signs:   Temp:  [97.9 F (36.6 C)-98.7 F (37.1 C)] 97.9 F (36.6 C) (08/09 0453) Pulse Rate:  [67-100] 67 (08/09 0453) Resp:  [18] 18 (08/09 0453) BP: (96-106)/(50-65) 106/65 mmHg (08/09 0453) SpO2:  [98 %-100 %] 98 % (08/09 0453) Weight:  [292 lb 3.2 oz (132.541 kg)] 292 lb 3.2 oz (132.541 kg) (08/09 0453) Last BM Date: 07/15/15  Weight change: Filed Weights   07/15/15 0503 07/16/15 0555 07/17/15 0453  Weight: 293 lb 11.2 oz (133.221 kg) 293 lb 9.8 oz (133.182 kg) 292 lb 3.2 oz (132.541 kg)    Intake/Output:   Intake/Output Summary (Last 24 hours) at 07/17/15 1331 Last data filed at 07/17/15 0856  Gross per 24 hour  Intake   1080 ml  Output   1200 ml  Net   -120 ml     Physical Exam: General: obese male No resp difficulty. Sitting in chair.  HEENT: normal Neck: supple. JVP difficult to assess. Carotids 2+ bilat; no bruits. No lymphadenopathy or thryomegaly appreciated. Cor: PMI nondisplaced. Irregular rate & rhythm. No rubs, gallops or murmurs. Lungs: clear Abdomen: soft, nontender, nondistended. No hepatosplenomegaly. No bruits or masses. Good bowel sounds. Extremities: no cyanosis, clubbing, rash, RLE and LLE with UNNA boots in place. Edema much improved.  Neuro: alert & orientedx3, cranial nerves grossly intact. moves all 4 extremities w/o difficulty. Affect  pleasant  Telemetry:  A fib 60s.  Tele shows occasional tachy but double counting.  Labs: CBC  Recent Labs  07/16/15 0406 07/17/15 0545  WBC 9.6 9.1  NEUTROABS 7.0 6.5  HGB 11.8* 11.8*  HCT 35.0* 36.0*  MCV 85.0 85.5  PLT 403* 381   Basic Metabolic Panel  Recent Labs  07/16/15 0406 07/17/15 0545  NA 125* 127*  K 4.0 4.5  CL 79* 82*  CO2 32 34*  GLUCOSE 113* 110*  BUN 71* 86*  CALCIUM 9.8 9.8   Liver Function Tests No results for input(s): AST, ALT, ALKPHOS, BILITOT, PROT, ALBUMIN in the last 72 hours. No results for input(s): LIPASE, AMYLASE in the last 72 hours. Cardiac Enzymes No results for input(s): CKTOTAL, CKMB, CKMBINDEX, TROPONINI in the last 72 hours.  BNP: BNP (last 3 results)  Recent Labs  03/07/15 1500 04/02/15 1244 07/08/15 1948  BNP 401.4* 223.9* 222.1*    ProBNP (last 3 results) No results for input(s): PROBNP in the last 8760 hours.   D-Dimer No results for input(s): DDIMER in the last 72 hours. Hemoglobin A1C No results for input(s): HGBA1C in the last 72 hours. Fasting Lipid Panel No results for input(s): CHOL, HDL, LDLCALC, TRIG, CHOLHDL, LDLDIRECT in the last 72 hours. Thyroid Function Tests No results for input(s): TSH, T4TOTAL, T3FREE, THYROIDAB in the last 72 hours.  Invalid input(s): FREET3  Other results:     Imaging/Studies:  No results found.  Latest Echo  Latest  Cath   Medications:     Scheduled Medications: . apixaban  5 mg Oral BID  . atorvastatin  80 mg Oral Daily  . carvedilol  6.25 mg Oral BID WC  . doxycycline  100 mg Oral Q12H  . potassium chloride SA  40 mEq Oral BID  . saccharomyces boulardii  250 mg Oral BID  . sodium chloride  3 mL Intravenous Q12H  . spironolactone  12.5 mg Oral Daily    Infusions:    PRN Medications: acetaminophen **OR** acetaminophen, ondansetron **OR** ondansetron (ZOFRAN) IV, oxyCODONE-acetaminophen   Assessment/Plan   Jesus [name] is 69 year old admitted  volume overload and suspected cellulitis.  1. A/C Diastolic HF- EF 55-60% . NYHA III.  2. RLE suspected cellulitis - on doxycyline.  3. A Fib Chronic - ChadsVasc Score= 3.  - Rate controlled with carvedilol.   On eliquis 5 mg twice a day  4. HTN -  5. Hyponatremia-.  6. AKI on CKD stage 3. - Cr 1.34. -> 1.65 -> 1.63 -> 1.67 -> 1.95  Creatinine continues to worsen.  Currently holding torsemide and metolazone. Will continue to follow. Will follow electrolytes with adjustment of diuresis. Will hold evening potassium.    Tried to explain delicate euvolemic balance between heart and kidneys.  Pt verbalized understanding and seemed content with observation for at least 1-2 days.  Dispo:  To home pending Renal improvement.  Length of Stay: [address]  Graciella Freer [name] 07/17/2015, 1:31 PM  Advanced Heart Failure Team Pager [phone] (M-F; 7a - 4p)  Please contact [facility] for night-coverage after hours (4p -7a ) and weekends on [email or web address]   Patient seen and examined with Otilio Saber, [name]. We discussed all aspects of the encounter. I agree with the assessment and plan as stated above.   Renal function worse. Agree with holding diuretics. Hopefully will imrpove tomorrow and we can get him home.   Bensimhon, Daniel,MD 7:55 PM

## 2015-07-18 DIAGNOSIS — D649 Anemia, unspecified: Secondary | ICD-10-CM

## 2015-07-18 DIAGNOSIS — L03115 Cellulitis of right lower limb: Secondary | ICD-10-CM

## 2015-07-18 LAB — CBC WITH DIFFERENTIAL/PLATELET
BASOS PCT: 0 % (ref 0–1)
Basophils Absolute: 0 10*3/uL (ref 0.0–0.1)
Eosinophils Absolute: 0.4 10*3/uL (ref 0.0–0.7)
Eosinophils Relative: 3 % (ref 0–5)
HCT: 35.1 % — ABNORMAL LOW (ref 39.0–52.0)
HEMOGLOBIN: 11.7 g/dL — AB (ref 13.0–17.0)
Lymphocytes Relative: 5 % — ABNORMAL LOW (ref 12–46)
Lymphs Abs: 0.5 10*3/uL — ABNORMAL LOW (ref 0.7–4.0)
MCH: 28.4 pg (ref 26.0–34.0)
MCHC: 33.3 g/dL (ref 30.0–36.0)
MCV: 85.2 fL (ref 78.0–100.0)
MONO ABS: 1.3 10*3/uL — AB (ref 0.1–1.0)
MONOS PCT: 12 % (ref 3–12)
Neutro Abs: 8.7 10*3/uL — ABNORMAL HIGH (ref 1.7–7.7)
Neutrophils Relative %: 79 % — ABNORMAL HIGH (ref 43–77)
Platelets: 363 10*3/uL (ref 150–400)
RBC: 4.12 MIL/uL — AB (ref 4.22–5.81)
RDW: 14.5 % (ref 11.5–15.5)
WBC: 11 10*3/uL — AB (ref 4.0–10.5)

## 2015-07-18 LAB — BASIC METABOLIC PANEL
ANION GAP: 14 (ref 5–15)
BUN: 87 mg/dL — ABNORMAL HIGH (ref 6–20)
CALCIUM: 10 mg/dL (ref 8.9–10.3)
CO2: 32 mmol/L (ref 22–32)
Chloride: 81 mmol/L — ABNORMAL LOW (ref 101–111)
Creatinine, Ser: 1.63 mg/dL — ABNORMAL HIGH (ref 0.61–1.24)
GFR, EST AFRICAN AMERICAN: 48 mL/min — AB (ref >60)
GFR, EST NON AFRICAN AMERICAN: 41 mL/min — AB (ref >60)
Glucose, Bld: 129 mg/dL — ABNORMAL HIGH (ref 65–99)
Potassium: 4.8 mmol/L (ref 3.5–5.1)
SODIUM: 127 mmol/L — AB (ref 135–145)

## 2015-07-18 LAB — GLUCOSE, CAPILLARY: GLUCOSE-CAPILLARY: 127 mg/dL — AB (ref 65–99)

## 2015-07-18 MED ORDER — PANTOPRAZOLE SODIUM 40 MG PO TBEC: 40.0000 mg | DELAYED_RELEASE_TABLET | Freq: Every day | ORAL | Status: DC

## 2015-07-18 MED ORDER — SACCHAROMYCES BOULARDII 250 MG PO CAPS: 250.0000 mg | ORAL_CAPSULE | Freq: Two times a day (BID) | ORAL | Status: DC

## 2015-07-18 MED ORDER — SPIRONOLACTONE 25 MG PO TABS: 25.0000 mg | ORAL_TABLET | Freq: Every day | ORAL | Status: DC

## 2015-07-18 MED ORDER — LISINOPRIL 10 MG PO TABS: 10.0000 mg | ORAL_TABLET | Freq: Every day | ORAL | Status: DC

## 2015-07-18 MED ORDER — POTASSIUM CHLORIDE CRYS ER 20 MEQ PO TBCR: 40.0000 meq | EXTENDED_RELEASE_TABLET | Freq: Two times a day (BID) | ORAL | Status: DC

## 2015-07-18 MED ORDER — DOXYCYCLINE HYCLATE 100 MG PO TABS: 100.0000 mg | ORAL_TABLET | Freq: Two times a day (BID) | ORAL | Status: DC

## 2015-07-18 MED ORDER — TORSEMIDE 20 MG PO TABS: 60.0000 mg | ORAL_TABLET | Freq: Two times a day (BID) | ORAL | Status: DC

## 2015-07-18 MED ORDER — ATORVASTATIN CALCIUM 80 MG PO TABS: 80.0000 mg | ORAL_TABLET | Freq: Every day | ORAL | Status: DC

## 2015-07-18 MED ORDER — APIXABAN 5 MG PO TABS: 5.0000 mg | ORAL_TABLET | Freq: Two times a day (BID) | ORAL | Status: DC

## 2015-07-18 MED ORDER — CARVEDILOL 6.25 MG PO TABS: 6.2500 mg | ORAL_TABLET | Freq: Two times a day (BID) | ORAL | Status: DC

## 2015-07-18 NOTE — Progress Notes (Signed)
Advanced Heart Failure Rounding Note  PCP: Dr Sedalia Muta Primary HF Cardiologist: Dr Shirlee Latch  Subjective:    Admitted to [facility] with increased leg and scrotal edema.   Feels fine symptomatically, says swelling is down and near/at normal. Switched to po torsemide and metolazone 07/15/15. Both being held currently with worsening creatinine. Weight down one pound. Down 13 pounds from admit. Creatinine worsened 1.67 -> 1.95.    Very frustrated to still be in hospital.  Does not understand why his kidney function is down.  Objective:   Weight Range: 132.449 kg (292 lb) Body mass index is 41.9 kg/(m^2).   Vital Signs:   Temp:  [98 F (36.7 C)-98.6 F (37 C)] 98 F (36.7 C) (08/10 0641) Pulse Rate:  [65-71] 70 (08/10 0641) Resp:  [18-20] 20 (08/10 0641) BP: (110-115)/(54-69) 110/60 mmHg (08/10 0641) SpO2:  [96 %-100 %] 98 % (08/10 0641) Weight:  [132.449 kg (292 lb)] 132.449 kg (292 lb) (08/10 0641) Last BM Date: 07/17/15  Weight change: Filed Weights   07/16/15 0555 07/17/15 0453 07/18/15 0641  Weight: 133.182 kg (293 lb 9.8 oz) 132.541 kg (292 lb 3.2 oz) 132.449 kg (292 lb)    Intake/Output:   Intake/Output Summary (Last 24 hours) at 07/18/15 0755 Last data filed at 07/17/15 2152  Gross per 24 hour  Intake   1320 ml  Output      0 ml  Net   1320 ml     Physical Exam: General: obese male No resp difficulty. Sitting in chair.  HEENT: normal Neck: supple. JVP difficult to assess. Carotids 2+ bilat; no bruits. No lymphadenopathy or thryomegaly appreciated. Cor: PMI nondisplaced. Irregular rate & rhythm. No rubs, gallops or murmurs. Lungs: clear Abdomen: soft, nontender, nondistended. No hepatosplenomegaly. No bruits or masses. Good bowel sounds. Extremities: no cyanosis, clubbing, rash, RLE and LLE with UNNA boots in place. Edema much improved.  Neuro: alert & orientedx3, cranial nerves grossly intact. moves all 4 extremities w/o difficulty. Affect pleasant  Telemetry:  A fib 60s.   Tele shows occasional tachy but double counting.  Labs: CBC  Recent Labs  07/17/15 0545 07/18/15 0338  WBC 9.1 11.0*  NEUTROABS 6.5 8.7*  HGB 11.8* 11.7*  HCT 36.0* 35.1*  MCV 85.5 85.2  PLT 381 363   Basic Metabolic Panel  Recent Labs  07/17/15 0545 07/18/15 0338  NA 127* 127*  K 4.5 4.8  CL 82* 81*  CO2 34* 32  GLUCOSE 110* 129*  BUN 86* 87*  CALCIUM 9.8 10.0   Liver Function Tests No results for input(s): AST, ALT, ALKPHOS, BILITOT, PROT, ALBUMIN in the last 72 hours. No results for input(s): LIPASE, AMYLASE in the last 72 hours. Cardiac Enzymes No results for input(s): CKTOTAL, CKMB, CKMBINDEX, TROPONINI in the last 72 hours.  BNP: BNP (last 3 results)  Recent Labs  03/07/15 1500 04/02/15 1244 07/08/15 1948  BNP 401.4* 223.9* 222.1*    ProBNP (last 3 results) No results for input(s): PROBNP in the last 8760 hours.   D-Dimer No results for input(s): DDIMER in the last 72 hours. Hemoglobin A1C No results for input(s): HGBA1C in the last 72 hours. Fasting Lipid Panel No results for input(s): CHOL, HDL, LDLCALC, TRIG, CHOLHDL, LDLDIRECT in the last 72 hours. Thyroid Function Tests No results for input(s): TSH, T4TOTAL, T3FREE, THYROIDAB in the last 72 hours.  Invalid input(s): FREET3  Other results:     Imaging/Studies:  No results found.  Latest Echo  Latest Cath   Medications:  Scheduled Medications: . apixaban  5 mg Oral BID  . atorvastatin  80 mg Oral Daily  . carvedilol  6.25 mg Oral BID WC  . doxycycline  100 mg Oral Q12H  . potassium chloride SA  40 mEq Oral BID  . saccharomyces boulardii  250 mg Oral BID  . sodium chloride  3 mL Intravenous Q12H  . spironolactone  12.5 mg Oral Daily    Infusions:    PRN Medications: acetaminophen **OR** acetaminophen, ondansetron **OR** ondansetron (ZOFRAN) IV, oxyCODONE-acetaminophen   Assessment/Plan   Jesus [name] is 69 year old admitted volume overload and suspected  cellulitis.  1. A/C Diastolic HF- EF 55-60% . NYHA III.  2. RLE suspected cellulitis - on doxycyline.  3. A Fib Chronic - ChadsVasc Score= 3.  - Rate controlled with carvedilol.   On eliquis 5 mg twice a day  4. HTN -  5. Hyponatremia-.  6. AKI on CKD stage 3.   Creatinine better. Weight stable.   Can go home today on PTA medicines including torsemide 60 bid and spiro 25 mg daily - would not start until tomorrow. Will need f/u with [facility] early next week.   Length of Stay: 10  Jesus [name], Daniel,MD 7:56 AM   Advanced Heart Failure Team Pager [phone] (M-F; 7a - 4p)  Please contact [facility] for night-coverage after hours (4p -7a ) and weekends on [email or web address]

## 2015-07-18 NOTE — Progress Notes (Signed)
Pt a/o, no c/o pain, pt will be dc to home, pt stable

## 2015-07-18 NOTE — Progress Notes (Signed)
Orthopedic Tech Progress Note Patient Details:  Jesus [name] 1946/06/10 [id number]  Ortho Devices Type of Ortho Device: Roland Rack boot Ortho Device/Splint Location: bilateral Ortho Device/Splint Interventions: Application   Nikki Dom 07/18/2015, 1:44 PM

## 2015-07-18 NOTE — Discharge Summary (Signed)
PATIENT DETAILS Name: Jesus [name] Age: 69 y.o. Sex: male Date of Birth: 07/30/1946 MRN: [id number]. Admitting Physician: Eduard Clos, MD WUJ:[id number],YNWGN, [name]  Admit Date: 07/08/2015 Discharge date: 07/18/2015  Recommendations for Outpatient Follow-up:  1. Please check chemistries at next visit   PRIMARY DISCHARGE DIAGNOSIS:  Principal Problem:   CHF (congestive heart failure) Active Problems:   Hypertension   Acute on chronic diastolic CHF (congestive heart failure)   Chronic atrial fibrillation   Chronic anemia   Acute on chronic renal failure   Hypokalemia   Hyponatremia   Cellulitis of right lower extremity      PAST MEDICAL HISTORY: Past Medical History  Diagnosis Date  . Hypertension   . Morbid obesity   . H/O: GI bleed     "cause I took too much aspirin"  . Venous stasis ulcer   . Atrial fibrillation     a. chronic   . Edema of lower extremity   . Chronic diastolic CHF (congestive heart failure) 12/2002    a. EF 50-55% by echo 04/2012  . Gout   . Hyperglycemia     Noted 05/2012    DISCHARGE MEDICATIONS: Current Discharge Medication List    START taking these medications   Details  doxycycline (VIBRA-TABS) 100 MG tablet Take 1 tablet (100 mg total) by mouth every 12 (twelve) hours. Qty: 6 tablet, Refills: 0    saccharomyces boulardii (FLORASTOR) 250 MG capsule Take 1 capsule (250 mg total) by mouth 2 (two) times daily. Qty: 60 capsule, Refills: 0      CONTINUE these medications which have CHANGED   Details  apixaban (ELIQUIS) 5 MG TABS tablet Take 1 tablet (5 mg total) by mouth 2 (two) times daily. Qty: 60 tablet, Refills: 0    atorvastatin (LIPITOR) 80 MG tablet Take 1 tablet (80 mg total) by mouth daily. Qty: 30 tablet, Refills: 0    carvedilol (COREG) 6.25 MG tablet Take 1 tablet (6.25 mg total) by mouth 2 (two) times daily with a meal. Qty: 60 tablet, Refills: 0    lisinopril (PRINIVIL,ZESTRIL) 10 MG tablet Take 1 tablet (10  mg total) by mouth daily. Qty: 30 tablet, Refills: 0    pantoprazole (PROTONIX) 40 MG tablet Take 1 tablet (40 mg total) by mouth daily. Qty: 30 tablet, Refills: 0    potassium chloride SA (K-DUR,KLOR-CON) 20 MEQ tablet Take 2 tablets (40 mEq total) by mouth 2 (two) times daily. Qty: 60 tablet, Refills: 0    spironolactone (ALDACTONE) 25 MG tablet Take 1 tablet (25 mg total) by mouth daily. Start 8/11 Qty: 30 tablet, Refills: 0    torsemide (DEMADEX) 20 MG tablet Take 3 tablets (60 mg total) by mouth 2 (two) times daily. Start 8/11 Qty: 180 tablet, Refills: 0      STOP taking these medications     amLODipine (NORVASC) 5 MG tablet      colchicine 0.6 MG tablet         ALLERGIES:   Allergies  Allergen Reactions  . Amlodipine Besylate Swelling    "started off low and ended up severe; if, in fact, that is what's causing the swelling" (06/03/12)    BRIEF HPI:  See H&P, Labs, Consult and Test reports for all details in brief, patient  is a 69 year old male with chronic diastolic heart failure, atrial fibrillation on anticoagulation who presented to the ED with worsening lower extremity edema.  CONSULTATIONS:   cardiology  PERTINENT RADIOLOGIC STUDIES: Dg Chest 2 View  07/08/2015   CLINICAL DATA:  Bilateral lower extremity swelling for 3 days.  EXAM: CHEST  2 VIEW  COMPARISON:  01/04/2015  FINDINGS: There is marked cardiomegaly, unchanged. There is mild vascular congestion, unchanged. No alveolar edema or infiltrate. No large effusion.  IMPRESSION: Unchanged cardiomegaly. Mild vascular congestion, also unchanged and possibly chronic.   Electronically Signed   By: Ellery Plunk M.D.   On: 07/08/2015 21:37     PERTINENT LAB RESULTS: CBC:  Recent Labs  07/17/15 0545 07/18/15 0338  WBC 9.1 11.0*  HGB 11.8* 11.7*  HCT 36.0* 35.1*  PLT 381 363   CMET CMP     Component Value Date/Time   NA 127* 07/18/2015 0338   K 4.8 07/18/2015 0338   CL 81* 07/18/2015 0338   CO2  32 07/18/2015 0338   GLUCOSE 129* 07/18/2015 0338   BUN 87* 07/18/2015 0338   CREATININE 1.63* 07/18/2015 0338   CALCIUM 10.0 07/18/2015 0338   PROT 6.7 07/09/2015 0250   ALBUMIN 3.3* 07/09/2015 0250   AST 12* 07/09/2015 0250   ALT 8* 07/09/2015 0250   ALKPHOS 150* 07/09/2015 0250   BILITOT 0.9 07/09/2015 0250   GFRNONAA 41* 07/18/2015 0338   GFRAA 48* 07/18/2015 0338    GFR Estimated Creatinine Clearance: 58.6 mL/min (by C-G formula based on Cr of 1.63). No results for input(s): LIPASE, AMYLASE in the last 72 hours. No results for input(s): CKTOTAL, CKMB, CKMBINDEX, TROPONINI in the last 72 hours. Invalid input(s): POCBNP No results for input(s): DDIMER in the last 72 hours. No results for input(s): HGBA1C in the last 72 hours. No results for input(s): CHOL, HDL, LDLCALC, TRIG, CHOLHDL, LDLDIRECT in the last 72 hours. No results for input(s): TSH, T4TOTAL, T3FREE, THYROIDAB in the last 72 hours.  Invalid input(s): FREET3 No results for input(s): VITAMINB12, FOLATE, FERRITIN, TIBC, IRON, RETICCTPCT in the last 72 hours. Coags: No results for input(s): INR in the last 72 hours.  Invalid input(s): PT Microbiology: No results found for this or any previous visit (from the past 240 hour(s)).   BRIEF HOSPITAL COURSE:   Principal Problem: Acute on chronic diastolic heart failure: Admitted and started on IV diuretics, CHF team was consulted. Much improved at the time of discharge. CHF team recommending starting torsemide and Aldactone on 8/11. Spoke with  Dr Bensimhon-recommended that we continue ACE inhibitor on discharge. Please follow electrolytes closely at next visit.   Active Problems:  Acute on chronic kidney disease stage 2-3: improving, etiology felt to be due to CHF exacerbation and possible use of diuretics. Diuretics were held the last few days of his hospitalization, since creatinine improved, heart failure team recommending restart diuretics on 8/11. Please check  chemistry panel at next visit  Hypokalemia: Secondary to diuretics use, potassium normal at the time of discharge. Continue supplementation.  Bilateral lower extremity cellulitis: Much improved, has chronic skin changes-seen by wound care team, plans are to continue with Unna boot on discharge. Continue doxycycline for a few more days to complete a ten-day course.  Chronic atrial fibrillation: Rate controlled with Coreg and Eliquis.  History of hypertension: Resuming low-dose lisinopril on discharge (see above), continue with Coreg. Have discontinued amlodipine  Hyponatremia: Likely secondary to CHF/volume overload. Continue Demadex and follow sodium closely in the outpatient setting. Hyponatremia is currently mild and patient is asymptomatic.  Morbid obesity: Counseled regarding importance of weight loss  Anemia: Mild-likely secondary to chronic disease. Follow CBC periodically in the outpatient setting   TODAY-DAY OF DISCHARGE:  Subjective:   Jesus [name] today has no headache,no chest abdominal pain,no new weakness tingling or numbness, feels much better wants to go home today.   Objective:   Blood pressure 110/60, pulse 70, temperature 98 F (36.7 C), temperature source Oral, resp. rate 20, height 5\' 10"  (1.778 m), weight 132.449 kg (292 lb), SpO2 98 %.  Intake/Output Summary (Last 24 hours) at 07/18/15 1041 Last data filed at 07/18/15 0835  Gross per 24 hour  Intake   4340 ml  Output      0 ml  Net   4340 ml   Filed Weights   07/16/15 0555 07/17/15 0453 07/18/15 0641  Weight: 133.182 kg (293 lb 9.8 oz) 132.541 kg (292 lb 3.2 oz) 132.449 kg (292 lb)    Exam Awake Alert, Oriented *3, No new F.N deficits, Normal affect [place].AT,PERRAL Supple Neck,No JVD, No cervical lymphadenopathy appriciated.  Symmetrical Chest wall movement, Good air movement bilaterally, CTAB RRR,No Gallops,Rubs or new Murmurs, No Parasternal Heave +ve B.Sounds, Abd Soft, Non tender, No  organomegaly appriciated, No rebound -guarding or rigidity.  DISCHARGE CONDITION: Stable  DISPOSITION: Home with home health services  DISCHARGE INSTRUCTIONS:    Activity:  As tolerated with Full fall precautions use walker/cane & assistance as needed  Diet recommendation: Heart Healthy diet  Discharge Instructions    (HEART FAILURE PATIENTS) Call MD:  Anytime you have any of the following symptoms: 1) 3 pound weight gain in 24 hours or 5 pounds in 1 week 2) shortness of breath, with or without a dry hacking cough 3) swelling in the hands, feet or stomach 4) if you have to sleep on extra pillows at night in order to breathe.    Complete by:  As directed      ACE Inhibitor / ARB already ordered    Complete by:  As directed      AMB Referral to [facility] Care Management    Complete by:  As directed   Reason for consult:  HF exacerbation, hx with [facility] Care Management for community follow up  Expected date of contact:  1-3 days (reserved for hospital discharges)  Please assign to community nurse for transition of care calls and assess for home visits. Patient had a history with [facility] previously and was doing his wound care to his bilateral LEs. Information shared with inpatient RNCM as well.  Questions please call: Charlesetta Shanks, RN BSN CCM [facility]  [phone] business mobile phone     Avoid straining    Complete by:  As directed      Diet - low sodium heart healthy    Complete by:  As directed      Heart Failure patients record your daily weight using the same scale at the same time of day    Complete by:  As directed      Increase activity slowly    Complete by:  As directed      STOP any activity that causes chest pain, shortness of breath, dizziness, sweating, or exessive weakness    Complete by:  As directed            Follow-up Information    Follow up with [name], MD. Go on 07/23/2015.   Specialty:  Cardiology   Why:  at 1030  am for hospital follow up.  Please bring all of your medications. Pt parking code is 0008.   Contact information:   [address] [place]  [address] [phone]       Follow up with [facility].   Why:  for your wound care, una boot change 3 times a week   Contact information:   [address] [place], [place] [address]  Call [phone]        Follow up with [facility].   Why:  they will do your outpatient physical therapy and will contact you at home for a start up date and time for therapy   Contact information:   [address] [place] [address] [phone]       Follow up with [name],[name], [name]. Schedule an appointment as soon as possible for a visit in 1 week.   Specialty:  Physician Assistant   Contact information:   [address] [place] [address]       Total Time spent on discharge equals 45 minutes.  SignedJeoffrey Massed 07/18/2015 10:41 AM

## 2015-07-19 NOTE — Progress Notes (Signed)
Received call from the wound care center stating that they cannot accept the patient. TCT [facility], talked to Fannie Knee, she stated that she will take care of that. Wound care information faxed to [facility] as requested. Abelino Derrick [facility] [phone]

## 2015-07-20 ENCOUNTER — Other Ambulatory Visit: Payer: Self-pay

## 2015-07-20 NOTE — Patient Outreach (Signed)
Transition of care attempt:  2nd attempt to reach patient for transition of care. No answer.   Rowe Pavy, RN, BSN, CEN [facility] [employer] [phone]

## 2015-07-20 NOTE — Patient Outreach (Signed)
Attempt to reach patient. No answer.  Unidentified machine. No message left.  Will attempt again.  Rowe Pavy, RN, BSN, CEN [facility] [employer] [phone]

## 2015-07-23 ENCOUNTER — Other Ambulatory Visit: Payer: Self-pay

## 2015-07-23 ENCOUNTER — Encounter (HOSPITAL_COMMUNITY): Payer: Self-pay

## 2015-07-24 ENCOUNTER — Other Ambulatory Visit: Payer: Self-pay

## 2015-07-24 NOTE — Patient Outreach (Signed)
Transition of care call:  3rd attempt to reach patient.  Patient answered phone on 8/15. Discussed hospitalization.  Patient reports that he had to got  the hospital for swelling.  I asked patient if he went to his hospital follow up appointment today and he said he had no transportation. Reports that his daughter went on vacation for 1 week and will not be home until the end of this week.  Explained importance of timely follow up.  Questioned patient about his primary MD follow up appointment. He states that he dose not have one.  Reports that he would not be able to get to appointment due to transportation concerns.  I offered to assist with making an appointment.  Patient agreed.  CHF: Patient reports that he is weighing daily. Reports weight today of 296. Reminded patient about his discharge weight of 292 when he left the hospital.  He reported that he weighs first thing in the morning after emptying his bladder. Reports that he is following his low salt diet.  Swelling:  Reports that his legs are swollen.States that he removed the [employer]. Patient reports that "someone is supposed to do wraps to his legs" Patient denies knowing what company is coming.  Patient reports that he elevates his legs all the time. Medications:  Unable to review medications because daughter is the only one who knows what he is taking and she is out of town on vacation. Patient reports that he thinks that he has all of his medications.   Phone call with Fannie Knee at [facility]. Placed call to speak with Fannie Knee to determine who is ordered to do wound care. Fannie Knee reports that patient is supposed to go to the wound clinic today. Fannie Knee reports that she informed patients daughter of this appointment. Fannie Knee also reported that she attempted to reach patient multiple times since discharge and has been unsuccessful.  I ask about follow up PCP appointment. Fannie Knee reports that patient has an appointment for 8/16 with Gerre Pebbles PA. Fannie Knee was  planning to call patient.  Call back from Fannie Knee to state that patient was notified of his pending appointment and its importance. Patient normally rides his scooter or has someone bring him into office.  Fannie Knee informed me that patient has not been accepted for therapy by any company due to past history for safety concerns and inappropriate and suggestive comments to home health nurses. Fannie Knee reports that patient will not take responsibilities for his actions.   At this time patient is pending PCP follow up on 8/16 at 11am. Missed heart failure appointment on 8/15. Missed wound clinic appointment on 8/15. Has been difficult to engage due to not answering phone. Plan: Will follow up with [facility] to see if patient attended appointment.  Will continue transition of care call.  Murphy [facility] CM Care Plan Problem One        Patient Outreach Telephone from 07/23/2015 in [facility]   Care Plan Problem One  Recent Admission for heart failure   Care Plan for Problem One  Active   [facility] Long Term Goal (31-90 days)  Patient will be able to report no readmission in the next 31 days.   [facility] Long Term Goal Start Date  07/23/15   Interventions for Problem One Long Term Goal  Discussed importance of follow up with MD's.  Encouraged patient to attend appointments that are scheduled for him.   [facility] CM Short Term Goal #1 (0-30 days)  Patient will  be able to report no readmissions in the next 30 days.   [facility] CM Short Term Goal #1 Start Date  07/23/15   Interventions for Short Term Goal #1  Reviewed importance of following low salt diet. Reviewed importance of going to wound center. Spoke with PCP office to inform of patients perception of no follow up appointments.Discussed case with Livia Snellen.   [facility] CM Short Term Goal #2 (0-30 days)  Patient will be able to verbalize weighing daily for the next 30 days.   [facility] CM Short Term Goal #2 Start Date  07/23/15   Interventions for Short Term Goal #2  Reviewed  importance of daily weights. reviewed heart failure zones. Discussed  if weight gain of greater than 3 pounds in 1 day or 5 pounds in a week to call MD for instructions.     Rowe Pavy, RN, BSN, CEN [facility] Coordinator [phone]

## 2015-07-24 NOTE — Patient Outreach (Signed)
Care Coordination: Placed follow up call to PCP office and patient was a no show to appointment today.  Rowe Pavy, RN, BSN, CEN [facility] [employer] [phone]

## 2015-07-30 ENCOUNTER — Other Ambulatory Visit: Payer: Self-pay | Admitting: *Deleted

## 2015-07-30 NOTE — Patient Outreach (Signed)
[facility]) Care Management  07/30/2015  Pacer Dorn San [birth year] [id number]  Transition of Care  Successful telephone call to patient.  CHF :Mr.Hammerschmidt reports that his weight today is 294 this am, previously stated weight 296.Denies shortness of breath, chest pain, reports swelling in his legs but states "its not any worse"   Medications: Mr.Greer reports that he has taken his medications for this am, he stated to me, that his daughter has all of his pill bottles her home and that she fills his pill box weekly, he states that he only has  his pill box that  is labeled morning and evening, he also states that he does not know the names of his medication. Mr.Gintz gave me his daughter's name and phone number and asked me to call her to review his medications.Unsuccessful attempt to reach her at this time. Unable to review medications at this time.  Appointments: Discussed with Mr.Laday his missed appointment with Gerre Pebbles PA on 8/16, His wound care appointment on 8/15. Mr.Schraeder states that he will call Gerre Pebbles PA to reschedule office appointment, he was unclear on the  wound clinic appointment, he stated that he had not been going to the wound clinic "someone had been coming to my house to change the [employer] dressing.  Discussed with him the missed appointments and the importance of and encouraged to reschedule.  Reviewed with Mr.Westerman importance of notifying his doctors office for increase in weights, 3 pounds in a day, 5 pounds in a week, increased swelling or shortness of breath.  Egbert Garibaldi, RN, [facility] Care Management [phone] Plan: Continue transition of care calls

## 2015-07-31 ENCOUNTER — Other Ambulatory Visit: Payer: Self-pay | Admitting: *Deleted

## 2015-07-31 NOTE — Patient Outreach (Signed)
[facility]) Care Management  07/31/2015  Jesus [name] 1946/05/13 [id number]   Incoming call from [facility] Mr.Coyle's daughter that fills his pill box. [name] returned my phone call from yesterday, regarding reviewing Mr.Derks's medication list, Liliane Shi states that she recently returned home from vacation. I reviewed the updated discharge medication list in EPIC, [facility] stated that she did not have a copy of Mr.Cogar's discharge instructions stating another sibling may have it. I discussed with Mrs.Deforge the changes to his medication list, as well as the new medication and prescriptions to be picked up., she states that she will take care of it. Discussed with Mrs.Deforge that Mr.Schmuhl missed his follow up appointment with Gerre Pebbles PA, and the importance of rescheduling, she also asked about other agencies available to come to home and change the dressings on his legs, she reports that it is not possible for him to go to the wound center, states she will discuss with Gerre Pebbles PA  Egbert Garibaldi, RN, [facility] Care Management [phone]

## 2015-08-07 ENCOUNTER — Other Ambulatory Visit: Payer: Self-pay

## 2015-08-07 NOTE — Patient Outreach (Signed)
Care Coordination/ transition of care call: Placed call to [facility]. Spoke with office manager  Inocencio Homes to get an update about wound care and pending MD appointments. I was informed that patient has a follow up MD appointment with Gerre Pebbles on 8/31 at 10am.  Inocencio Homes states that daughter Laury Deep is to bring patient to appointment.  Unknown if patient ever attended wound clinic appointment. Patient has not followed up at the heart failure clinic. Patient was a no show for post hospital appointment at the heart failure clinic.  Placed call to patient. No answer. Placed call to daughter [facility]. No answer. Left a message reminding daughter of patients pending appointment with primary care provider. Also provided my contact information and ask daughter to return call.  PLAN: Will continue transition of care attempts.  Rowe Pavy, RN, BSN, CEN [facility] [employer] [phone]

## 2015-08-08 DIAGNOSIS — R748 Abnormal levels of other serum enzymes: Secondary | ICD-10-CM | POA: Diagnosis not present

## 2015-08-08 DIAGNOSIS — R5383 Other fatigue: Secondary | ICD-10-CM | POA: Diagnosis not present

## 2015-08-08 DIAGNOSIS — I509 Heart failure, unspecified: Secondary | ICD-10-CM | POA: Diagnosis not present

## 2015-08-08 DIAGNOSIS — I4891 Unspecified atrial fibrillation: Secondary | ICD-10-CM | POA: Diagnosis not present

## 2015-08-08 DIAGNOSIS — E782 Mixed hyperlipidemia: Secondary | ICD-10-CM | POA: Diagnosis not present

## 2015-08-08 DIAGNOSIS — I10 Essential (primary) hypertension: Secondary | ICD-10-CM | POA: Diagnosis not present

## 2015-08-08 DIAGNOSIS — D6489 Other specified anemias: Secondary | ICD-10-CM | POA: Diagnosis not present

## 2015-08-14 ENCOUNTER — Other Ambulatory Visit: Payer: Self-pay

## 2015-08-14 NOTE — Patient Outreach (Signed)
Care coordination:  Placed call to [facility] and spoke with Fannie Knee. She reports that patient did come into office with daughter and saw Gerre Pebbles on 08/08/2015.  Office set up for patient to attend [facility] on Monday, Wednesday and Fridays.  Office also set up [facility] transportation for these appointments. Fannie Knee reports labs were completed. Patients weight on 08/08/2015 was 312 pounds and patient did have swelling and cellulitis. Patient was placed on keflex.  Patient has follow up appointment on 09/10/2015 at [facility].  While on phone with Fannie Knee she as Gerre Pebbles if she had any other concerns. At this time the reply was NO.  Informed office that patient is not engaging with [facility] and I would be attempting outreach today. I did notify the office that patient had no rescheduled his no show appointment at the heart failure clinic.  Placed call to patient. No answer.  PLAN:  Will keep patient open for 1 more week and then close. Rowe Pavy, RN, BSN, CEN [facility] [employer] [phone]

## 2015-08-16 DIAGNOSIS — I890 Lymphedema, not elsewhere classified: Secondary | ICD-10-CM | POA: Diagnosis not present

## 2015-08-16 DIAGNOSIS — M109 Gout, unspecified: Secondary | ICD-10-CM | POA: Diagnosis not present

## 2015-08-16 DIAGNOSIS — I4891 Unspecified atrial fibrillation: Secondary | ICD-10-CM | POA: Diagnosis not present

## 2015-08-16 DIAGNOSIS — L97811 Non-pressure chronic ulcer of other part of right lower leg limited to breakdown of skin: Secondary | ICD-10-CM | POA: Diagnosis not present

## 2015-08-16 DIAGNOSIS — I872 Venous insufficiency (chronic) (peripheral): Secondary | ICD-10-CM | POA: Diagnosis not present

## 2015-08-16 DIAGNOSIS — I5030 Unspecified diastolic (congestive) heart failure: Secondary | ICD-10-CM | POA: Diagnosis not present

## 2015-08-20 DIAGNOSIS — I872 Venous insufficiency (chronic) (peripheral): Secondary | ICD-10-CM | POA: Diagnosis not present

## 2015-08-20 DIAGNOSIS — I890 Lymphedema, not elsewhere classified: Secondary | ICD-10-CM | POA: Diagnosis not present

## 2015-08-20 DIAGNOSIS — L97811 Non-pressure chronic ulcer of other part of right lower leg limited to breakdown of skin: Secondary | ICD-10-CM | POA: Diagnosis not present

## 2015-08-20 NOTE — Patient Outreach (Signed)
Final Transition of care call:  Discharge date-07/18/2015 PLEASE NOTE UNABLE TO REVIEW MEDICATIONS WITH PATIENT AS HE DOES NOT KNOW WHAT MEDICATIONS THAT HE TAKES.  Synopsis: Patient very difficult to engage. Multiple attempts to reach patient in the last 30 days without success. Reached patient today as he is waiting to go to the wound center.  Patient reports that his memory is bad.  Reports that he is doing well and does not need any help except for his leg wraps.    CHF: Patient reports that his scale has not been working for 3 days due to a battery problem. Encouraged patient to get a battery and he states that he will. Reviewed action plan for weight gain. Patient reports his weight range of 295 to 230 pounds. Last weight 3 days ago but is unable to give me a number.  Reports that he is following his low salt diet but he states " as you know everything you put in your mouth has salt"  Reports that his right leg is wrapped but the left one is not. He thinks the left legs needs to be wrapped today because it is "leaking"  Reports that he used [facility] last week for transportation but today his daughter is taking him to his appointment " in 10 minutes".   Discussed follow up with primary Md and he states that he does not know when that appointment is. States that he daughter handles all his appointments. Discussed with patient how he missed his follow up appointment at the Heart Failure clinic. I inquired if patient is planning to reschedule and he said he did not know. He reports that he can walk in to see a cardiologist in [place] without an appointment. Patient reports that he is able to walk using a cane if needed. Reports eating well.  Patient reports that his daughter Jonette Eva is in charge of all of his medications. He reports that she places his pills in a 7 day pill planned and then he takes his medications. THN CM Care Plan Problem One        Most Recent Value   Care Plan Problem One  Recent  Admission for heart failure   Role Documenting the Problem One  Care Management [place] for Problem One  Active   THN Long Term Goal (31-90 days)  Patient will be able to report no readmission in the next 31 days.   THN Long Term Goal Start Date  07/23/15   [facility] Long Term Goal Met Date  08/20/15 [facility] met. no readmissions]   Interventions for Problem One Long Term Goal  Discussed importance of follow up with MD's.  Encouraged patient to attend appointments that are scheduled for him.   [facility]N CM Short Term Goal #1 (0-30 days)  Patient will be able to report no readmissions in the next 30 days.   THN CM Short Term Goal #1 Start Date  07/23/15   [facility] CM Short Term Goal #1 Met Date  08/20/15 [facility] met. no readmissions]   Interventions for Short Term Goal #1  Reviewed importance of following low salt diet. Reviewed importance of going to wound center. Spoke with PCP office to inform of patients perception of no follow up appointments.Discussed case with Bary Castilla.   THN CM Short Term Goal #2 (0-30 days)  Patient will be able to verbalize weighing daily for the next 30 days.   THN CM Short Term Goal #2 Start Date  07/23/15  [facility]N CM Short Term Goal #2 Met Date  08/20/15 [goal not met. scale is not working]   Interventions for Short Term Goal #2  encouraged patient to get a battery and continue to weigh as directed. Reviewed weight parameters to call MD. patient voiced understanding.     Depression screen PHQ 2/9 08/20/2015  Decreased Interest 0  Down, Depressed, Hopeless 0  PHQ - 2 Score 0   Fall Risk  08/20/2015  Falls in the past year? No   Assessment:  Difficult to engage                         Not following up at the heart failure clinic.                          PLAN:       Completed 31 days post admission without a readmission.                     Patient is active with wound center and primary care MD.                   Plan to discharged from [facility] care management as  patient voiced no goals that he wants to work on. Encouraged patient to follow low salt diet, and weigh daily. Encouraged patient to call primary MD for concerns. Will send MD case closure and patient case closure.   Tomasa Rand, RN, BSN, CEN [facility] [employer] [phone]

## 2015-08-21 ENCOUNTER — Other Ambulatory Visit: Payer: Self-pay

## 2015-08-21 NOTE — Patient Outreach (Signed)
[facility]) Care Management  08/21/2015  Zeno Hickel Crisco 06/24/1946 [id number]   Notification from Tomasa Rand, RN to close case due to goals met with [place] Management.  Thanks, Ronnell Freshwater. [place], [place] Assistant Phone: [phone] Fax: [phone]

## 2015-08-22 DIAGNOSIS — I10 Essential (primary) hypertension: Secondary | ICD-10-CM | POA: Diagnosis not present

## 2015-08-22 DIAGNOSIS — I5020 Unspecified systolic (congestive) heart failure: Secondary | ICD-10-CM | POA: Diagnosis not present

## 2015-08-22 DIAGNOSIS — I4891 Unspecified atrial fibrillation: Secondary | ICD-10-CM | POA: Diagnosis not present

## 2015-08-22 DIAGNOSIS — I872 Venous insufficiency (chronic) (peripheral): Secondary | ICD-10-CM | POA: Diagnosis not present

## 2015-08-22 DIAGNOSIS — I890 Lymphedema, not elsewhere classified: Secondary | ICD-10-CM | POA: Diagnosis not present

## 2015-08-24 DIAGNOSIS — I10 Essential (primary) hypertension: Secondary | ICD-10-CM | POA: Diagnosis not present

## 2015-08-24 DIAGNOSIS — I4891 Unspecified atrial fibrillation: Secondary | ICD-10-CM | POA: Diagnosis not present

## 2015-08-24 DIAGNOSIS — I872 Venous insufficiency (chronic) (peripheral): Secondary | ICD-10-CM | POA: Diagnosis not present

## 2015-08-24 DIAGNOSIS — I5020 Unspecified systolic (congestive) heart failure: Secondary | ICD-10-CM | POA: Diagnosis not present

## 2015-08-24 DIAGNOSIS — I890 Lymphedema, not elsewhere classified: Secondary | ICD-10-CM | POA: Diagnosis not present

## 2015-08-27 DIAGNOSIS — L97811 Non-pressure chronic ulcer of other part of right lower leg limited to breakdown of skin: Secondary | ICD-10-CM | POA: Diagnosis not present

## 2015-08-27 DIAGNOSIS — I872 Venous insufficiency (chronic) (peripheral): Secondary | ICD-10-CM | POA: Diagnosis not present

## 2015-08-27 DIAGNOSIS — I890 Lymphedema, not elsewhere classified: Secondary | ICD-10-CM | POA: Diagnosis not present

## 2015-08-29 DIAGNOSIS — I5020 Unspecified systolic (congestive) heart failure: Secondary | ICD-10-CM | POA: Diagnosis not present

## 2015-08-29 DIAGNOSIS — I4891 Unspecified atrial fibrillation: Secondary | ICD-10-CM | POA: Diagnosis not present

## 2015-08-29 DIAGNOSIS — I890 Lymphedema, not elsewhere classified: Secondary | ICD-10-CM | POA: Diagnosis not present

## 2015-08-29 DIAGNOSIS — I10 Essential (primary) hypertension: Secondary | ICD-10-CM | POA: Diagnosis not present

## 2015-08-29 DIAGNOSIS — I872 Venous insufficiency (chronic) (peripheral): Secondary | ICD-10-CM | POA: Diagnosis not present

## 2015-08-31 DIAGNOSIS — I5020 Unspecified systolic (congestive) heart failure: Secondary | ICD-10-CM | POA: Diagnosis not present

## 2015-08-31 DIAGNOSIS — I890 Lymphedema, not elsewhere classified: Secondary | ICD-10-CM | POA: Diagnosis not present

## 2015-08-31 DIAGNOSIS — I10 Essential (primary) hypertension: Secondary | ICD-10-CM | POA: Diagnosis not present

## 2015-08-31 DIAGNOSIS — I4891 Unspecified atrial fibrillation: Secondary | ICD-10-CM | POA: Diagnosis not present

## 2015-08-31 DIAGNOSIS — I872 Venous insufficiency (chronic) (peripheral): Secondary | ICD-10-CM | POA: Diagnosis not present

## 2015-09-03 DIAGNOSIS — I872 Venous insufficiency (chronic) (peripheral): Secondary | ICD-10-CM | POA: Diagnosis not present

## 2015-09-03 DIAGNOSIS — I890 Lymphedema, not elsewhere classified: Secondary | ICD-10-CM | POA: Diagnosis not present

## 2015-09-03 DIAGNOSIS — L97811 Non-pressure chronic ulcer of other part of right lower leg limited to breakdown of skin: Secondary | ICD-10-CM | POA: Diagnosis not present

## 2015-09-05 DIAGNOSIS — I872 Venous insufficiency (chronic) (peripheral): Secondary | ICD-10-CM | POA: Diagnosis not present

## 2015-09-05 DIAGNOSIS — I5020 Unspecified systolic (congestive) heart failure: Secondary | ICD-10-CM | POA: Diagnosis not present

## 2015-09-05 DIAGNOSIS — I10 Essential (primary) hypertension: Secondary | ICD-10-CM | POA: Diagnosis not present

## 2015-09-05 DIAGNOSIS — I4891 Unspecified atrial fibrillation: Secondary | ICD-10-CM | POA: Diagnosis not present

## 2015-09-05 DIAGNOSIS — I890 Lymphedema, not elsewhere classified: Secondary | ICD-10-CM | POA: Diagnosis not present

## 2015-09-07 DIAGNOSIS — I10 Essential (primary) hypertension: Secondary | ICD-10-CM | POA: Diagnosis not present

## 2015-09-07 DIAGNOSIS — I872 Venous insufficiency (chronic) (peripheral): Secondary | ICD-10-CM | POA: Diagnosis not present

## 2015-09-07 DIAGNOSIS — I890 Lymphedema, not elsewhere classified: Secondary | ICD-10-CM | POA: Diagnosis not present

## 2015-09-07 DIAGNOSIS — I4891 Unspecified atrial fibrillation: Secondary | ICD-10-CM | POA: Diagnosis not present

## 2015-09-07 DIAGNOSIS — I5020 Unspecified systolic (congestive) heart failure: Secondary | ICD-10-CM | POA: Diagnosis not present

## 2015-09-10 DIAGNOSIS — I872 Venous insufficiency (chronic) (peripheral): Secondary | ICD-10-CM | POA: Diagnosis not present

## 2015-09-10 DIAGNOSIS — L97811 Non-pressure chronic ulcer of other part of right lower leg limited to breakdown of skin: Secondary | ICD-10-CM | POA: Diagnosis not present

## 2015-09-10 DIAGNOSIS — I890 Lymphedema, not elsewhere classified: Secondary | ICD-10-CM | POA: Diagnosis not present

## 2015-09-11 DIAGNOSIS — I10 Essential (primary) hypertension: Secondary | ICD-10-CM | POA: Diagnosis not present

## 2015-09-11 DIAGNOSIS — D649 Anemia, unspecified: Secondary | ICD-10-CM | POA: Diagnosis not present

## 2015-09-11 DIAGNOSIS — I4891 Unspecified atrial fibrillation: Secondary | ICD-10-CM | POA: Diagnosis not present

## 2015-09-11 DIAGNOSIS — R7301 Impaired fasting glucose: Secondary | ICD-10-CM | POA: Diagnosis not present

## 2015-09-11 DIAGNOSIS — I509 Heart failure, unspecified: Secondary | ICD-10-CM | POA: Diagnosis not present

## 2015-09-12 DIAGNOSIS — I890 Lymphedema, not elsewhere classified: Secondary | ICD-10-CM | POA: Diagnosis not present

## 2015-09-12 DIAGNOSIS — I872 Venous insufficiency (chronic) (peripheral): Secondary | ICD-10-CM | POA: Diagnosis not present

## 2015-09-12 DIAGNOSIS — I5020 Unspecified systolic (congestive) heart failure: Secondary | ICD-10-CM | POA: Diagnosis not present

## 2015-09-12 DIAGNOSIS — I10 Essential (primary) hypertension: Secondary | ICD-10-CM | POA: Diagnosis not present

## 2015-09-12 DIAGNOSIS — I4891 Unspecified atrial fibrillation: Secondary | ICD-10-CM | POA: Diagnosis not present

## 2015-09-14 DIAGNOSIS — I10 Essential (primary) hypertension: Secondary | ICD-10-CM | POA: Diagnosis not present

## 2015-09-14 DIAGNOSIS — I4891 Unspecified atrial fibrillation: Secondary | ICD-10-CM | POA: Diagnosis not present

## 2015-09-14 DIAGNOSIS — I890 Lymphedema, not elsewhere classified: Secondary | ICD-10-CM | POA: Diagnosis not present

## 2015-09-14 DIAGNOSIS — I5020 Unspecified systolic (congestive) heart failure: Secondary | ICD-10-CM | POA: Diagnosis not present

## 2015-09-14 DIAGNOSIS — I872 Venous insufficiency (chronic) (peripheral): Secondary | ICD-10-CM | POA: Diagnosis not present

## 2015-09-17 DIAGNOSIS — I890 Lymphedema, not elsewhere classified: Secondary | ICD-10-CM | POA: Diagnosis not present

## 2015-09-17 DIAGNOSIS — L97811 Non-pressure chronic ulcer of other part of right lower leg limited to breakdown of skin: Secondary | ICD-10-CM | POA: Diagnosis not present

## 2015-09-17 DIAGNOSIS — I872 Venous insufficiency (chronic) (peripheral): Secondary | ICD-10-CM | POA: Diagnosis not present

## 2015-09-19 DIAGNOSIS — I10 Essential (primary) hypertension: Secondary | ICD-10-CM | POA: Diagnosis not present

## 2015-09-19 DIAGNOSIS — I4891 Unspecified atrial fibrillation: Secondary | ICD-10-CM | POA: Diagnosis not present

## 2015-09-19 DIAGNOSIS — I872 Venous insufficiency (chronic) (peripheral): Secondary | ICD-10-CM | POA: Diagnosis not present

## 2015-09-19 DIAGNOSIS — I5020 Unspecified systolic (congestive) heart failure: Secondary | ICD-10-CM | POA: Diagnosis not present

## 2015-09-19 DIAGNOSIS — I890 Lymphedema, not elsewhere classified: Secondary | ICD-10-CM | POA: Diagnosis not present

## 2015-09-21 DIAGNOSIS — I872 Venous insufficiency (chronic) (peripheral): Secondary | ICD-10-CM | POA: Diagnosis not present

## 2015-09-21 DIAGNOSIS — I890 Lymphedema, not elsewhere classified: Secondary | ICD-10-CM | POA: Diagnosis not present

## 2015-09-21 DIAGNOSIS — I5020 Unspecified systolic (congestive) heart failure: Secondary | ICD-10-CM | POA: Diagnosis not present

## 2015-09-21 DIAGNOSIS — I4891 Unspecified atrial fibrillation: Secondary | ICD-10-CM | POA: Diagnosis not present

## 2015-09-21 DIAGNOSIS — I10 Essential (primary) hypertension: Secondary | ICD-10-CM | POA: Diagnosis not present

## 2015-09-24 DIAGNOSIS — I872 Venous insufficiency (chronic) (peripheral): Secondary | ICD-10-CM | POA: Diagnosis not present

## 2015-09-24 DIAGNOSIS — L97811 Non-pressure chronic ulcer of other part of right lower leg limited to breakdown of skin: Secondary | ICD-10-CM | POA: Diagnosis not present

## 2015-09-24 DIAGNOSIS — I890 Lymphedema, not elsewhere classified: Secondary | ICD-10-CM | POA: Diagnosis not present

## 2015-09-26 DIAGNOSIS — I872 Venous insufficiency (chronic) (peripheral): Secondary | ICD-10-CM | POA: Diagnosis not present

## 2015-09-26 DIAGNOSIS — I4891 Unspecified atrial fibrillation: Secondary | ICD-10-CM | POA: Diagnosis not present

## 2015-09-26 DIAGNOSIS — I5020 Unspecified systolic (congestive) heart failure: Secondary | ICD-10-CM | POA: Diagnosis not present

## 2015-09-26 DIAGNOSIS — I10 Essential (primary) hypertension: Secondary | ICD-10-CM | POA: Diagnosis not present

## 2015-09-26 DIAGNOSIS — I890 Lymphedema, not elsewhere classified: Secondary | ICD-10-CM | POA: Diagnosis not present

## 2015-09-28 DIAGNOSIS — I5020 Unspecified systolic (congestive) heart failure: Secondary | ICD-10-CM | POA: Diagnosis not present

## 2015-09-28 DIAGNOSIS — I872 Venous insufficiency (chronic) (peripheral): Secondary | ICD-10-CM | POA: Diagnosis not present

## 2015-09-28 DIAGNOSIS — I10 Essential (primary) hypertension: Secondary | ICD-10-CM | POA: Diagnosis not present

## 2015-09-28 DIAGNOSIS — I890 Lymphedema, not elsewhere classified: Secondary | ICD-10-CM | POA: Diagnosis not present

## 2015-09-28 DIAGNOSIS — I4891 Unspecified atrial fibrillation: Secondary | ICD-10-CM | POA: Diagnosis not present

## 2015-10-01 DIAGNOSIS — L97811 Non-pressure chronic ulcer of other part of right lower leg limited to breakdown of skin: Secondary | ICD-10-CM | POA: Diagnosis not present

## 2015-10-01 DIAGNOSIS — I890 Lymphedema, not elsewhere classified: Secondary | ICD-10-CM | POA: Diagnosis not present

## 2015-10-01 DIAGNOSIS — I872 Venous insufficiency (chronic) (peripheral): Secondary | ICD-10-CM | POA: Diagnosis not present

## 2015-10-03 DIAGNOSIS — I4891 Unspecified atrial fibrillation: Secondary | ICD-10-CM | POA: Diagnosis not present

## 2015-10-03 DIAGNOSIS — I5020 Unspecified systolic (congestive) heart failure: Secondary | ICD-10-CM | POA: Diagnosis not present

## 2015-10-03 DIAGNOSIS — I10 Essential (primary) hypertension: Secondary | ICD-10-CM | POA: Diagnosis not present

## 2015-10-03 DIAGNOSIS — I872 Venous insufficiency (chronic) (peripheral): Secondary | ICD-10-CM | POA: Diagnosis not present

## 2015-10-03 DIAGNOSIS — I890 Lymphedema, not elsewhere classified: Secondary | ICD-10-CM | POA: Diagnosis not present

## 2015-10-08 DIAGNOSIS — I10 Essential (primary) hypertension: Secondary | ICD-10-CM | POA: Diagnosis not present

## 2015-10-08 DIAGNOSIS — I4891 Unspecified atrial fibrillation: Secondary | ICD-10-CM | POA: Diagnosis not present

## 2015-10-08 DIAGNOSIS — I872 Venous insufficiency (chronic) (peripheral): Secondary | ICD-10-CM | POA: Diagnosis not present

## 2015-10-08 DIAGNOSIS — I890 Lymphedema, not elsewhere classified: Secondary | ICD-10-CM | POA: Diagnosis not present

## 2015-10-08 DIAGNOSIS — I5020 Unspecified systolic (congestive) heart failure: Secondary | ICD-10-CM | POA: Diagnosis not present

## 2015-10-10 DIAGNOSIS — I5020 Unspecified systolic (congestive) heart failure: Secondary | ICD-10-CM | POA: Diagnosis not present

## 2015-10-10 DIAGNOSIS — I872 Venous insufficiency (chronic) (peripheral): Secondary | ICD-10-CM | POA: Diagnosis not present

## 2015-10-10 DIAGNOSIS — I890 Lymphedema, not elsewhere classified: Secondary | ICD-10-CM | POA: Diagnosis not present

## 2015-10-10 DIAGNOSIS — I4891 Unspecified atrial fibrillation: Secondary | ICD-10-CM | POA: Diagnosis not present

## 2015-10-10 DIAGNOSIS — I10 Essential (primary) hypertension: Secondary | ICD-10-CM | POA: Diagnosis not present

## 2015-10-12 DIAGNOSIS — I872 Venous insufficiency (chronic) (peripheral): Secondary | ICD-10-CM | POA: Diagnosis not present

## 2015-10-12 DIAGNOSIS — I4891 Unspecified atrial fibrillation: Secondary | ICD-10-CM | POA: Diagnosis not present

## 2015-10-12 DIAGNOSIS — I5020 Unspecified systolic (congestive) heart failure: Secondary | ICD-10-CM | POA: Diagnosis not present

## 2015-10-12 DIAGNOSIS — I10 Essential (primary) hypertension: Secondary | ICD-10-CM | POA: Diagnosis not present

## 2015-10-12 DIAGNOSIS — I890 Lymphedema, not elsewhere classified: Secondary | ICD-10-CM | POA: Diagnosis not present

## 2015-10-15 DIAGNOSIS — L97811 Non-pressure chronic ulcer of other part of right lower leg limited to breakdown of skin: Secondary | ICD-10-CM | POA: Diagnosis not present

## 2015-10-15 DIAGNOSIS — I890 Lymphedema, not elsewhere classified: Secondary | ICD-10-CM | POA: Diagnosis not present

## 2015-10-15 DIAGNOSIS — I872 Venous insufficiency (chronic) (peripheral): Secondary | ICD-10-CM | POA: Diagnosis not present

## 2015-10-17 DIAGNOSIS — I890 Lymphedema, not elsewhere classified: Secondary | ICD-10-CM | POA: Diagnosis not present

## 2015-10-17 DIAGNOSIS — I10 Essential (primary) hypertension: Secondary | ICD-10-CM | POA: Diagnosis not present

## 2015-10-17 DIAGNOSIS — I4891 Unspecified atrial fibrillation: Secondary | ICD-10-CM | POA: Diagnosis not present

## 2015-10-17 DIAGNOSIS — I872 Venous insufficiency (chronic) (peripheral): Secondary | ICD-10-CM | POA: Diagnosis not present

## 2015-10-17 DIAGNOSIS — I5020 Unspecified systolic (congestive) heart failure: Secondary | ICD-10-CM | POA: Diagnosis not present

## 2015-10-19 DIAGNOSIS — I890 Lymphedema, not elsewhere classified: Secondary | ICD-10-CM | POA: Diagnosis not present

## 2015-10-19 DIAGNOSIS — I4891 Unspecified atrial fibrillation: Secondary | ICD-10-CM | POA: Diagnosis not present

## 2015-10-19 DIAGNOSIS — I5020 Unspecified systolic (congestive) heart failure: Secondary | ICD-10-CM | POA: Diagnosis not present

## 2015-10-19 DIAGNOSIS — I872 Venous insufficiency (chronic) (peripheral): Secondary | ICD-10-CM | POA: Diagnosis not present

## 2015-10-19 DIAGNOSIS — I10 Essential (primary) hypertension: Secondary | ICD-10-CM | POA: Diagnosis not present

## 2015-10-22 DIAGNOSIS — I890 Lymphedema, not elsewhere classified: Secondary | ICD-10-CM | POA: Diagnosis not present

## 2015-10-22 DIAGNOSIS — I872 Venous insufficiency (chronic) (peripheral): Secondary | ICD-10-CM | POA: Diagnosis not present

## 2015-10-22 DIAGNOSIS — L97811 Non-pressure chronic ulcer of other part of right lower leg limited to breakdown of skin: Secondary | ICD-10-CM | POA: Diagnosis not present

## 2015-10-24 DIAGNOSIS — I5020 Unspecified systolic (congestive) heart failure: Secondary | ICD-10-CM | POA: Diagnosis not present

## 2015-10-24 DIAGNOSIS — I890 Lymphedema, not elsewhere classified: Secondary | ICD-10-CM | POA: Diagnosis not present

## 2015-10-24 DIAGNOSIS — I10 Essential (primary) hypertension: Secondary | ICD-10-CM | POA: Diagnosis not present

## 2015-10-24 DIAGNOSIS — I872 Venous insufficiency (chronic) (peripheral): Secondary | ICD-10-CM | POA: Diagnosis not present

## 2015-10-24 DIAGNOSIS — I4891 Unspecified atrial fibrillation: Secondary | ICD-10-CM | POA: Diagnosis not present

## 2015-10-26 DIAGNOSIS — I890 Lymphedema, not elsewhere classified: Secondary | ICD-10-CM | POA: Diagnosis not present

## 2015-10-26 DIAGNOSIS — I5020 Unspecified systolic (congestive) heart failure: Secondary | ICD-10-CM | POA: Diagnosis not present

## 2015-10-26 DIAGNOSIS — I872 Venous insufficiency (chronic) (peripheral): Secondary | ICD-10-CM | POA: Diagnosis not present

## 2015-10-26 DIAGNOSIS — I10 Essential (primary) hypertension: Secondary | ICD-10-CM | POA: Diagnosis not present

## 2015-10-26 DIAGNOSIS — I4891 Unspecified atrial fibrillation: Secondary | ICD-10-CM | POA: Diagnosis not present

## 2015-10-29 DIAGNOSIS — I872 Venous insufficiency (chronic) (peripheral): Secondary | ICD-10-CM | POA: Diagnosis not present

## 2015-10-29 DIAGNOSIS — I890 Lymphedema, not elsewhere classified: Secondary | ICD-10-CM | POA: Diagnosis not present

## 2015-10-29 DIAGNOSIS — L97811 Non-pressure chronic ulcer of other part of right lower leg limited to breakdown of skin: Secondary | ICD-10-CM | POA: Diagnosis not present

## 2015-11-02 DIAGNOSIS — I872 Venous insufficiency (chronic) (peripheral): Secondary | ICD-10-CM | POA: Diagnosis not present

## 2015-11-02 DIAGNOSIS — I890 Lymphedema, not elsewhere classified: Secondary | ICD-10-CM | POA: Diagnosis not present

## 2015-11-02 DIAGNOSIS — I5020 Unspecified systolic (congestive) heart failure: Secondary | ICD-10-CM | POA: Diagnosis not present

## 2015-11-02 DIAGNOSIS — I10 Essential (primary) hypertension: Secondary | ICD-10-CM | POA: Diagnosis not present

## 2015-11-02 DIAGNOSIS — I4891 Unspecified atrial fibrillation: Secondary | ICD-10-CM | POA: Diagnosis not present

## 2015-11-05 DIAGNOSIS — L97811 Non-pressure chronic ulcer of other part of right lower leg limited to breakdown of skin: Secondary | ICD-10-CM | POA: Diagnosis not present

## 2015-11-05 DIAGNOSIS — I872 Venous insufficiency (chronic) (peripheral): Secondary | ICD-10-CM | POA: Diagnosis not present

## 2015-11-05 DIAGNOSIS — I890 Lymphedema, not elsewhere classified: Secondary | ICD-10-CM | POA: Diagnosis not present

## 2015-11-12 DIAGNOSIS — I872 Venous insufficiency (chronic) (peripheral): Secondary | ICD-10-CM | POA: Diagnosis not present

## 2015-11-12 DIAGNOSIS — L97811 Non-pressure chronic ulcer of other part of right lower leg limited to breakdown of skin: Secondary | ICD-10-CM | POA: Diagnosis not present

## 2015-11-12 DIAGNOSIS — I890 Lymphedema, not elsewhere classified: Secondary | ICD-10-CM | POA: Diagnosis not present

## 2015-11-14 DIAGNOSIS — I10 Essential (primary) hypertension: Secondary | ICD-10-CM | POA: Diagnosis not present

## 2015-11-14 DIAGNOSIS — I872 Venous insufficiency (chronic) (peripheral): Secondary | ICD-10-CM | POA: Diagnosis not present

## 2015-11-14 DIAGNOSIS — I4891 Unspecified atrial fibrillation: Secondary | ICD-10-CM | POA: Diagnosis not present

## 2015-11-14 DIAGNOSIS — I890 Lymphedema, not elsewhere classified: Secondary | ICD-10-CM | POA: Diagnosis not present

## 2015-11-14 DIAGNOSIS — I5020 Unspecified systolic (congestive) heart failure: Secondary | ICD-10-CM | POA: Diagnosis not present

## 2015-11-15 DIAGNOSIS — I5022 Chronic systolic (congestive) heart failure: Secondary | ICD-10-CM | POA: Diagnosis not present

## 2015-11-15 DIAGNOSIS — I10 Essential (primary) hypertension: Secondary | ICD-10-CM | POA: Diagnosis not present

## 2015-11-15 DIAGNOSIS — R809 Proteinuria, unspecified: Secondary | ICD-10-CM | POA: Diagnosis not present

## 2015-11-15 DIAGNOSIS — D582 Other hemoglobinopathies: Secondary | ICD-10-CM | POA: Diagnosis not present

## 2015-11-15 DIAGNOSIS — R799 Abnormal finding of blood chemistry, unspecified: Secondary | ICD-10-CM | POA: Diagnosis not present

## 2015-11-16 DIAGNOSIS — I872 Venous insufficiency (chronic) (peripheral): Secondary | ICD-10-CM | POA: Diagnosis not present

## 2015-11-16 DIAGNOSIS — I890 Lymphedema, not elsewhere classified: Secondary | ICD-10-CM | POA: Diagnosis not present

## 2015-11-16 DIAGNOSIS — I4891 Unspecified atrial fibrillation: Secondary | ICD-10-CM | POA: Diagnosis not present

## 2015-11-16 DIAGNOSIS — I10 Essential (primary) hypertension: Secondary | ICD-10-CM | POA: Diagnosis not present

## 2015-11-16 DIAGNOSIS — I5020 Unspecified systolic (congestive) heart failure: Secondary | ICD-10-CM | POA: Diagnosis not present

## 2015-11-19 DIAGNOSIS — L97811 Non-pressure chronic ulcer of other part of right lower leg limited to breakdown of skin: Secondary | ICD-10-CM | POA: Diagnosis not present

## 2015-11-19 DIAGNOSIS — L97211 Non-pressure chronic ulcer of right calf limited to breakdown of skin: Secondary | ICD-10-CM | POA: Diagnosis not present

## 2015-11-19 DIAGNOSIS — I872 Venous insufficiency (chronic) (peripheral): Secondary | ICD-10-CM | POA: Diagnosis not present

## 2015-11-19 DIAGNOSIS — I890 Lymphedema, not elsewhere classified: Secondary | ICD-10-CM | POA: Diagnosis not present

## 2015-11-21 DIAGNOSIS — I5020 Unspecified systolic (congestive) heart failure: Secondary | ICD-10-CM | POA: Diagnosis not present

## 2015-11-21 DIAGNOSIS — I10 Essential (primary) hypertension: Secondary | ICD-10-CM | POA: Diagnosis not present

## 2015-11-21 DIAGNOSIS — I4891 Unspecified atrial fibrillation: Secondary | ICD-10-CM | POA: Diagnosis not present

## 2015-11-21 DIAGNOSIS — I890 Lymphedema, not elsewhere classified: Secondary | ICD-10-CM | POA: Diagnosis not present

## 2015-11-21 DIAGNOSIS — I872 Venous insufficiency (chronic) (peripheral): Secondary | ICD-10-CM | POA: Diagnosis not present

## 2015-11-23 DIAGNOSIS — I10 Essential (primary) hypertension: Secondary | ICD-10-CM | POA: Diagnosis not present

## 2015-11-23 DIAGNOSIS — I5020 Unspecified systolic (congestive) heart failure: Secondary | ICD-10-CM | POA: Diagnosis not present

## 2015-11-23 DIAGNOSIS — I890 Lymphedema, not elsewhere classified: Secondary | ICD-10-CM | POA: Diagnosis not present

## 2015-11-23 DIAGNOSIS — I4891 Unspecified atrial fibrillation: Secondary | ICD-10-CM | POA: Diagnosis not present

## 2015-11-23 DIAGNOSIS — I872 Venous insufficiency (chronic) (peripheral): Secondary | ICD-10-CM | POA: Diagnosis not present

## 2015-11-28 DIAGNOSIS — I872 Venous insufficiency (chronic) (peripheral): Secondary | ICD-10-CM | POA: Diagnosis not present

## 2015-11-28 DIAGNOSIS — I5020 Unspecified systolic (congestive) heart failure: Secondary | ICD-10-CM | POA: Diagnosis not present

## 2015-11-28 DIAGNOSIS — I890 Lymphedema, not elsewhere classified: Secondary | ICD-10-CM | POA: Diagnosis not present

## 2015-11-28 DIAGNOSIS — I4891 Unspecified atrial fibrillation: Secondary | ICD-10-CM | POA: Diagnosis not present

## 2015-11-28 DIAGNOSIS — I10 Essential (primary) hypertension: Secondary | ICD-10-CM | POA: Diagnosis not present

## 2015-11-29 DIAGNOSIS — Z1211 Encounter for screening for malignant neoplasm of colon: Secondary | ICD-10-CM | POA: Diagnosis not present

## 2015-11-30 DIAGNOSIS — I10 Essential (primary) hypertension: Secondary | ICD-10-CM | POA: Diagnosis not present

## 2015-11-30 DIAGNOSIS — I4891 Unspecified atrial fibrillation: Secondary | ICD-10-CM | POA: Diagnosis not present

## 2015-11-30 DIAGNOSIS — I872 Venous insufficiency (chronic) (peripheral): Secondary | ICD-10-CM | POA: Diagnosis not present

## 2015-11-30 DIAGNOSIS — I890 Lymphedema, not elsewhere classified: Secondary | ICD-10-CM | POA: Diagnosis not present

## 2015-11-30 DIAGNOSIS — I5020 Unspecified systolic (congestive) heart failure: Secondary | ICD-10-CM | POA: Diagnosis not present

## 2015-12-05 DIAGNOSIS — I890 Lymphedema, not elsewhere classified: Secondary | ICD-10-CM | POA: Diagnosis not present

## 2015-12-05 DIAGNOSIS — I5020 Unspecified systolic (congestive) heart failure: Secondary | ICD-10-CM | POA: Diagnosis not present

## 2015-12-05 DIAGNOSIS — I872 Venous insufficiency (chronic) (peripheral): Secondary | ICD-10-CM | POA: Diagnosis not present

## 2015-12-05 DIAGNOSIS — I4891 Unspecified atrial fibrillation: Secondary | ICD-10-CM | POA: Diagnosis not present

## 2015-12-05 DIAGNOSIS — I10 Essential (primary) hypertension: Secondary | ICD-10-CM | POA: Diagnosis not present

## 2015-12-08 DIAGNOSIS — I10 Essential (primary) hypertension: Secondary | ICD-10-CM | POA: Diagnosis not present

## 2015-12-08 DIAGNOSIS — I872 Venous insufficiency (chronic) (peripheral): Secondary | ICD-10-CM | POA: Diagnosis not present

## 2015-12-08 DIAGNOSIS — I5020 Unspecified systolic (congestive) heart failure: Secondary | ICD-10-CM | POA: Diagnosis not present

## 2015-12-08 DIAGNOSIS — I890 Lymphedema, not elsewhere classified: Secondary | ICD-10-CM | POA: Diagnosis not present

## 2015-12-08 DIAGNOSIS — I4891 Unspecified atrial fibrillation: Secondary | ICD-10-CM | POA: Diagnosis not present

## 2015-12-12 DIAGNOSIS — I110 Hypertensive heart disease with heart failure: Secondary | ICD-10-CM | POA: Diagnosis not present

## 2015-12-12 DIAGNOSIS — I872 Venous insufficiency (chronic) (peripheral): Secondary | ICD-10-CM | POA: Diagnosis not present

## 2015-12-12 DIAGNOSIS — I4891 Unspecified atrial fibrillation: Secondary | ICD-10-CM | POA: Diagnosis not present

## 2015-12-12 DIAGNOSIS — I5030 Unspecified diastolic (congestive) heart failure: Secondary | ICD-10-CM | POA: Diagnosis not present

## 2015-12-12 DIAGNOSIS — Z7901 Long term (current) use of anticoagulants: Secondary | ICD-10-CM | POA: Diagnosis not present

## 2015-12-12 DIAGNOSIS — M109 Gout, unspecified: Secondary | ICD-10-CM | POA: Diagnosis not present

## 2015-12-12 DIAGNOSIS — I890 Lymphedema, not elsewhere classified: Secondary | ICD-10-CM | POA: Diagnosis not present

## 2015-12-14 DIAGNOSIS — M109 Gout, unspecified: Secondary | ICD-10-CM | POA: Diagnosis not present

## 2015-12-14 DIAGNOSIS — I5030 Unspecified diastolic (congestive) heart failure: Secondary | ICD-10-CM | POA: Diagnosis not present

## 2015-12-14 DIAGNOSIS — I4891 Unspecified atrial fibrillation: Secondary | ICD-10-CM | POA: Diagnosis not present

## 2015-12-14 DIAGNOSIS — I872 Venous insufficiency (chronic) (peripheral): Secondary | ICD-10-CM | POA: Diagnosis not present

## 2015-12-14 DIAGNOSIS — Z7901 Long term (current) use of anticoagulants: Secondary | ICD-10-CM | POA: Diagnosis not present

## 2015-12-14 DIAGNOSIS — I110 Hypertensive heart disease with heart failure: Secondary | ICD-10-CM | POA: Diagnosis not present

## 2015-12-14 DIAGNOSIS — I890 Lymphedema, not elsewhere classified: Secondary | ICD-10-CM | POA: Diagnosis not present

## 2015-12-19 DIAGNOSIS — I890 Lymphedema, not elsewhere classified: Secondary | ICD-10-CM | POA: Diagnosis not present

## 2015-12-19 DIAGNOSIS — I4891 Unspecified atrial fibrillation: Secondary | ICD-10-CM | POA: Diagnosis not present

## 2015-12-19 DIAGNOSIS — I110 Hypertensive heart disease with heart failure: Secondary | ICD-10-CM | POA: Diagnosis not present

## 2015-12-19 DIAGNOSIS — Z7901 Long term (current) use of anticoagulants: Secondary | ICD-10-CM | POA: Diagnosis not present

## 2015-12-19 DIAGNOSIS — I5030 Unspecified diastolic (congestive) heart failure: Secondary | ICD-10-CM | POA: Diagnosis not present

## 2015-12-19 DIAGNOSIS — I872 Venous insufficiency (chronic) (peripheral): Secondary | ICD-10-CM | POA: Diagnosis not present

## 2015-12-19 DIAGNOSIS — M109 Gout, unspecified: Secondary | ICD-10-CM | POA: Diagnosis not present

## 2015-12-21 DIAGNOSIS — I110 Hypertensive heart disease with heart failure: Secondary | ICD-10-CM | POA: Diagnosis not present

## 2015-12-21 DIAGNOSIS — I5030 Unspecified diastolic (congestive) heart failure: Secondary | ICD-10-CM | POA: Diagnosis not present

## 2015-12-21 DIAGNOSIS — Z7901 Long term (current) use of anticoagulants: Secondary | ICD-10-CM | POA: Diagnosis not present

## 2015-12-21 DIAGNOSIS — M109 Gout, unspecified: Secondary | ICD-10-CM | POA: Diagnosis not present

## 2015-12-21 DIAGNOSIS — I890 Lymphedema, not elsewhere classified: Secondary | ICD-10-CM | POA: Diagnosis not present

## 2015-12-21 DIAGNOSIS — I872 Venous insufficiency (chronic) (peripheral): Secondary | ICD-10-CM | POA: Diagnosis not present

## 2015-12-21 DIAGNOSIS — I4891 Unspecified atrial fibrillation: Secondary | ICD-10-CM | POA: Diagnosis not present

## 2015-12-26 DIAGNOSIS — M109 Gout, unspecified: Secondary | ICD-10-CM | POA: Diagnosis not present

## 2015-12-26 DIAGNOSIS — I4891 Unspecified atrial fibrillation: Secondary | ICD-10-CM | POA: Diagnosis not present

## 2015-12-26 DIAGNOSIS — Z7901 Long term (current) use of anticoagulants: Secondary | ICD-10-CM | POA: Diagnosis not present

## 2015-12-26 DIAGNOSIS — I5030 Unspecified diastolic (congestive) heart failure: Secondary | ICD-10-CM | POA: Diagnosis not present

## 2015-12-26 DIAGNOSIS — I872 Venous insufficiency (chronic) (peripheral): Secondary | ICD-10-CM | POA: Diagnosis not present

## 2015-12-26 DIAGNOSIS — I890 Lymphedema, not elsewhere classified: Secondary | ICD-10-CM | POA: Diagnosis not present

## 2015-12-26 DIAGNOSIS — I110 Hypertensive heart disease with heart failure: Secondary | ICD-10-CM | POA: Diagnosis not present

## 2015-12-28 DIAGNOSIS — I872 Venous insufficiency (chronic) (peripheral): Secondary | ICD-10-CM | POA: Diagnosis not present

## 2015-12-28 DIAGNOSIS — Z7901 Long term (current) use of anticoagulants: Secondary | ICD-10-CM | POA: Diagnosis not present

## 2015-12-28 DIAGNOSIS — I4891 Unspecified atrial fibrillation: Secondary | ICD-10-CM | POA: Diagnosis not present

## 2015-12-28 DIAGNOSIS — M109 Gout, unspecified: Secondary | ICD-10-CM | POA: Diagnosis not present

## 2015-12-28 DIAGNOSIS — I890 Lymphedema, not elsewhere classified: Secondary | ICD-10-CM | POA: Diagnosis not present

## 2015-12-28 DIAGNOSIS — I5030 Unspecified diastolic (congestive) heart failure: Secondary | ICD-10-CM | POA: Diagnosis not present

## 2015-12-28 DIAGNOSIS — I110 Hypertensive heart disease with heart failure: Secondary | ICD-10-CM | POA: Diagnosis not present

## 2015-12-31 DIAGNOSIS — I872 Venous insufficiency (chronic) (peripheral): Secondary | ICD-10-CM | POA: Diagnosis not present

## 2015-12-31 DIAGNOSIS — I4891 Unspecified atrial fibrillation: Secondary | ICD-10-CM | POA: Diagnosis not present

## 2015-12-31 DIAGNOSIS — I5030 Unspecified diastolic (congestive) heart failure: Secondary | ICD-10-CM | POA: Diagnosis not present

## 2015-12-31 DIAGNOSIS — I890 Lymphedema, not elsewhere classified: Secondary | ICD-10-CM | POA: Diagnosis not present

## 2015-12-31 DIAGNOSIS — M109 Gout, unspecified: Secondary | ICD-10-CM | POA: Diagnosis not present

## 2015-12-31 DIAGNOSIS — L97811 Non-pressure chronic ulcer of other part of right lower leg limited to breakdown of skin: Secondary | ICD-10-CM | POA: Diagnosis not present

## 2015-12-31 DIAGNOSIS — I10 Essential (primary) hypertension: Secondary | ICD-10-CM | POA: Diagnosis not present

## 2015-12-31 DIAGNOSIS — L97211 Non-pressure chronic ulcer of right calf limited to breakdown of skin: Secondary | ICD-10-CM | POA: Diagnosis not present

## 2016-01-02 DIAGNOSIS — I110 Hypertensive heart disease with heart failure: Secondary | ICD-10-CM | POA: Diagnosis not present

## 2016-01-02 DIAGNOSIS — Z7901 Long term (current) use of anticoagulants: Secondary | ICD-10-CM | POA: Diagnosis not present

## 2016-01-02 DIAGNOSIS — M109 Gout, unspecified: Secondary | ICD-10-CM | POA: Diagnosis not present

## 2016-01-02 DIAGNOSIS — I4891 Unspecified atrial fibrillation: Secondary | ICD-10-CM | POA: Diagnosis not present

## 2016-01-02 DIAGNOSIS — I5030 Unspecified diastolic (congestive) heart failure: Secondary | ICD-10-CM | POA: Diagnosis not present

## 2016-01-02 DIAGNOSIS — I890 Lymphedema, not elsewhere classified: Secondary | ICD-10-CM | POA: Diagnosis not present

## 2016-01-02 DIAGNOSIS — I872 Venous insufficiency (chronic) (peripheral): Secondary | ICD-10-CM | POA: Diagnosis not present

## 2016-01-04 DIAGNOSIS — I872 Venous insufficiency (chronic) (peripheral): Secondary | ICD-10-CM | POA: Diagnosis not present

## 2016-01-04 DIAGNOSIS — M109 Gout, unspecified: Secondary | ICD-10-CM | POA: Diagnosis not present

## 2016-01-04 DIAGNOSIS — I890 Lymphedema, not elsewhere classified: Secondary | ICD-10-CM | POA: Diagnosis not present

## 2016-01-04 DIAGNOSIS — I110 Hypertensive heart disease with heart failure: Secondary | ICD-10-CM | POA: Diagnosis not present

## 2016-01-04 DIAGNOSIS — Z7901 Long term (current) use of anticoagulants: Secondary | ICD-10-CM | POA: Diagnosis not present

## 2016-01-04 DIAGNOSIS — I5030 Unspecified diastolic (congestive) heart failure: Secondary | ICD-10-CM | POA: Diagnosis not present

## 2016-01-04 DIAGNOSIS — I4891 Unspecified atrial fibrillation: Secondary | ICD-10-CM | POA: Diagnosis not present

## 2016-01-07 DIAGNOSIS — I872 Venous insufficiency (chronic) (peripheral): Secondary | ICD-10-CM | POA: Diagnosis not present

## 2016-01-07 DIAGNOSIS — I890 Lymphedema, not elsewhere classified: Secondary | ICD-10-CM | POA: Diagnosis not present

## 2016-01-07 DIAGNOSIS — L97811 Non-pressure chronic ulcer of other part of right lower leg limited to breakdown of skin: Secondary | ICD-10-CM | POA: Diagnosis not present

## 2016-01-09 DIAGNOSIS — I5030 Unspecified diastolic (congestive) heart failure: Secondary | ICD-10-CM | POA: Diagnosis not present

## 2016-01-09 DIAGNOSIS — I110 Hypertensive heart disease with heart failure: Secondary | ICD-10-CM | POA: Diagnosis not present

## 2016-01-09 DIAGNOSIS — I890 Lymphedema, not elsewhere classified: Secondary | ICD-10-CM | POA: Diagnosis not present

## 2016-01-09 DIAGNOSIS — I4891 Unspecified atrial fibrillation: Secondary | ICD-10-CM | POA: Diagnosis not present

## 2016-01-09 DIAGNOSIS — M109 Gout, unspecified: Secondary | ICD-10-CM | POA: Diagnosis not present

## 2016-01-09 DIAGNOSIS — I872 Venous insufficiency (chronic) (peripheral): Secondary | ICD-10-CM | POA: Diagnosis not present

## 2016-01-09 DIAGNOSIS — Z7901 Long term (current) use of anticoagulants: Secondary | ICD-10-CM | POA: Diagnosis not present

## 2016-01-11 DIAGNOSIS — I110 Hypertensive heart disease with heart failure: Secondary | ICD-10-CM | POA: Diagnosis not present

## 2016-01-11 DIAGNOSIS — I4891 Unspecified atrial fibrillation: Secondary | ICD-10-CM | POA: Diagnosis not present

## 2016-01-11 DIAGNOSIS — M109 Gout, unspecified: Secondary | ICD-10-CM | POA: Diagnosis not present

## 2016-01-11 DIAGNOSIS — I890 Lymphedema, not elsewhere classified: Secondary | ICD-10-CM | POA: Diagnosis not present

## 2016-01-11 DIAGNOSIS — I5030 Unspecified diastolic (congestive) heart failure: Secondary | ICD-10-CM | POA: Diagnosis not present

## 2016-01-11 DIAGNOSIS — I872 Venous insufficiency (chronic) (peripheral): Secondary | ICD-10-CM | POA: Diagnosis not present

## 2016-01-11 DIAGNOSIS — Z7901 Long term (current) use of anticoagulants: Secondary | ICD-10-CM | POA: Diagnosis not present

## 2016-01-14 DIAGNOSIS — I890 Lymphedema, not elsewhere classified: Secondary | ICD-10-CM | POA: Diagnosis not present

## 2016-01-14 DIAGNOSIS — I872 Venous insufficiency (chronic) (peripheral): Secondary | ICD-10-CM | POA: Diagnosis not present

## 2016-01-14 DIAGNOSIS — Z872 Personal history of diseases of the skin and subcutaneous tissue: Secondary | ICD-10-CM | POA: Diagnosis not present

## 2016-01-14 DIAGNOSIS — Z09 Encounter for follow-up examination after completed treatment for conditions other than malignant neoplasm: Secondary | ICD-10-CM | POA: Diagnosis not present

## 2016-01-16 DIAGNOSIS — I4891 Unspecified atrial fibrillation: Secondary | ICD-10-CM | POA: Diagnosis not present

## 2016-01-16 DIAGNOSIS — Z7901 Long term (current) use of anticoagulants: Secondary | ICD-10-CM | POA: Diagnosis not present

## 2016-01-16 DIAGNOSIS — I5030 Unspecified diastolic (congestive) heart failure: Secondary | ICD-10-CM | POA: Diagnosis not present

## 2016-01-16 DIAGNOSIS — I890 Lymphedema, not elsewhere classified: Secondary | ICD-10-CM | POA: Diagnosis not present

## 2016-01-16 DIAGNOSIS — I872 Venous insufficiency (chronic) (peripheral): Secondary | ICD-10-CM | POA: Diagnosis not present

## 2016-01-16 DIAGNOSIS — I110 Hypertensive heart disease with heart failure: Secondary | ICD-10-CM | POA: Diagnosis not present

## 2016-01-16 DIAGNOSIS — M109 Gout, unspecified: Secondary | ICD-10-CM | POA: Diagnosis not present

## 2016-01-18 DIAGNOSIS — I872 Venous insufficiency (chronic) (peripheral): Secondary | ICD-10-CM | POA: Diagnosis not present

## 2016-01-18 DIAGNOSIS — I4891 Unspecified atrial fibrillation: Secondary | ICD-10-CM | POA: Diagnosis not present

## 2016-01-18 DIAGNOSIS — I110 Hypertensive heart disease with heart failure: Secondary | ICD-10-CM | POA: Diagnosis not present

## 2016-01-18 DIAGNOSIS — M109 Gout, unspecified: Secondary | ICD-10-CM | POA: Diagnosis not present

## 2016-01-18 DIAGNOSIS — I890 Lymphedema, not elsewhere classified: Secondary | ICD-10-CM | POA: Diagnosis not present

## 2016-01-18 DIAGNOSIS — I5030 Unspecified diastolic (congestive) heart failure: Secondary | ICD-10-CM | POA: Diagnosis not present

## 2016-01-18 DIAGNOSIS — Z7901 Long term (current) use of anticoagulants: Secondary | ICD-10-CM | POA: Diagnosis not present

## 2016-01-21 DIAGNOSIS — I872 Venous insufficiency (chronic) (peripheral): Secondary | ICD-10-CM | POA: Diagnosis not present

## 2016-01-21 DIAGNOSIS — Z872 Personal history of diseases of the skin and subcutaneous tissue: Secondary | ICD-10-CM | POA: Diagnosis not present

## 2016-01-21 DIAGNOSIS — I890 Lymphedema, not elsewhere classified: Secondary | ICD-10-CM | POA: Diagnosis not present

## 2016-01-21 DIAGNOSIS — Z09 Encounter for follow-up examination after completed treatment for conditions other than malignant neoplasm: Secondary | ICD-10-CM | POA: Diagnosis not present

## 2016-01-24 DIAGNOSIS — I110 Hypertensive heart disease with heart failure: Secondary | ICD-10-CM | POA: Diagnosis not present

## 2016-01-24 DIAGNOSIS — M109 Gout, unspecified: Secondary | ICD-10-CM | POA: Diagnosis not present

## 2016-01-24 DIAGNOSIS — I890 Lymphedema, not elsewhere classified: Secondary | ICD-10-CM | POA: Diagnosis not present

## 2016-01-24 DIAGNOSIS — I4891 Unspecified atrial fibrillation: Secondary | ICD-10-CM | POA: Diagnosis not present

## 2016-01-24 DIAGNOSIS — I872 Venous insufficiency (chronic) (peripheral): Secondary | ICD-10-CM | POA: Diagnosis not present

## 2016-01-24 DIAGNOSIS — I5030 Unspecified diastolic (congestive) heart failure: Secondary | ICD-10-CM | POA: Diagnosis not present

## 2016-01-24 DIAGNOSIS — Z7901 Long term (current) use of anticoagulants: Secondary | ICD-10-CM | POA: Diagnosis not present

## 2016-01-28 DIAGNOSIS — M109 Gout, unspecified: Secondary | ICD-10-CM | POA: Diagnosis not present

## 2016-01-28 DIAGNOSIS — I110 Hypertensive heart disease with heart failure: Secondary | ICD-10-CM | POA: Diagnosis not present

## 2016-01-28 DIAGNOSIS — I4891 Unspecified atrial fibrillation: Secondary | ICD-10-CM | POA: Diagnosis not present

## 2016-01-28 DIAGNOSIS — I890 Lymphedema, not elsewhere classified: Secondary | ICD-10-CM | POA: Diagnosis not present

## 2016-01-28 DIAGNOSIS — I872 Venous insufficiency (chronic) (peripheral): Secondary | ICD-10-CM | POA: Diagnosis not present

## 2016-01-28 DIAGNOSIS — Z7901 Long term (current) use of anticoagulants: Secondary | ICD-10-CM | POA: Diagnosis not present

## 2016-01-28 DIAGNOSIS — I5030 Unspecified diastolic (congestive) heart failure: Secondary | ICD-10-CM | POA: Diagnosis not present

## 2016-01-29 DIAGNOSIS — I509 Heart failure, unspecified: Secondary | ICD-10-CM | POA: Diagnosis not present

## 2016-01-29 DIAGNOSIS — I10 Essential (primary) hypertension: Secondary | ICD-10-CM | POA: Diagnosis not present

## 2016-02-05 DIAGNOSIS — I110 Hypertensive heart disease with heart failure: Secondary | ICD-10-CM | POA: Diagnosis not present

## 2016-02-05 DIAGNOSIS — I890 Lymphedema, not elsewhere classified: Secondary | ICD-10-CM | POA: Diagnosis not present

## 2016-02-05 DIAGNOSIS — M109 Gout, unspecified: Secondary | ICD-10-CM | POA: Diagnosis not present

## 2016-02-05 DIAGNOSIS — Z7901 Long term (current) use of anticoagulants: Secondary | ICD-10-CM | POA: Diagnosis not present

## 2016-02-05 DIAGNOSIS — I872 Venous insufficiency (chronic) (peripheral): Secondary | ICD-10-CM | POA: Diagnosis not present

## 2016-02-05 DIAGNOSIS — I4891 Unspecified atrial fibrillation: Secondary | ICD-10-CM | POA: Diagnosis not present

## 2016-02-05 DIAGNOSIS — I5030 Unspecified diastolic (congestive) heart failure: Secondary | ICD-10-CM | POA: Diagnosis not present

## 2016-02-08 DIAGNOSIS — I890 Lymphedema, not elsewhere classified: Secondary | ICD-10-CM | POA: Diagnosis not present

## 2016-02-08 DIAGNOSIS — I5030 Unspecified diastolic (congestive) heart failure: Secondary | ICD-10-CM | POA: Diagnosis not present

## 2016-02-08 DIAGNOSIS — I4891 Unspecified atrial fibrillation: Secondary | ICD-10-CM | POA: Diagnosis not present

## 2016-02-08 DIAGNOSIS — L97221 Non-pressure chronic ulcer of left calf limited to breakdown of skin: Secondary | ICD-10-CM | POA: Diagnosis not present

## 2016-02-08 DIAGNOSIS — L97211 Non-pressure chronic ulcer of right calf limited to breakdown of skin: Secondary | ICD-10-CM | POA: Diagnosis not present

## 2016-02-08 DIAGNOSIS — Z7901 Long term (current) use of anticoagulants: Secondary | ICD-10-CM | POA: Diagnosis not present

## 2016-02-08 DIAGNOSIS — M109 Gout, unspecified: Secondary | ICD-10-CM | POA: Diagnosis not present

## 2016-02-08 DIAGNOSIS — I872 Venous insufficiency (chronic) (peripheral): Secondary | ICD-10-CM | POA: Diagnosis not present

## 2016-02-08 DIAGNOSIS — I110 Hypertensive heart disease with heart failure: Secondary | ICD-10-CM | POA: Diagnosis not present

## 2016-02-12 DIAGNOSIS — Z7901 Long term (current) use of anticoagulants: Secondary | ICD-10-CM | POA: Diagnosis not present

## 2016-02-12 DIAGNOSIS — M109 Gout, unspecified: Secondary | ICD-10-CM | POA: Diagnosis not present

## 2016-02-12 DIAGNOSIS — I110 Hypertensive heart disease with heart failure: Secondary | ICD-10-CM | POA: Diagnosis not present

## 2016-02-12 DIAGNOSIS — I5030 Unspecified diastolic (congestive) heart failure: Secondary | ICD-10-CM | POA: Diagnosis not present

## 2016-02-12 DIAGNOSIS — I890 Lymphedema, not elsewhere classified: Secondary | ICD-10-CM | POA: Diagnosis not present

## 2016-02-12 DIAGNOSIS — L97221 Non-pressure chronic ulcer of left calf limited to breakdown of skin: Secondary | ICD-10-CM | POA: Diagnosis not present

## 2016-02-12 DIAGNOSIS — I4891 Unspecified atrial fibrillation: Secondary | ICD-10-CM | POA: Diagnosis not present

## 2016-02-12 DIAGNOSIS — L97211 Non-pressure chronic ulcer of right calf limited to breakdown of skin: Secondary | ICD-10-CM | POA: Diagnosis not present

## 2016-02-12 DIAGNOSIS — I10 Essential (primary) hypertension: Secondary | ICD-10-CM | POA: Diagnosis not present

## 2016-02-12 DIAGNOSIS — I872 Venous insufficiency (chronic) (peripheral): Secondary | ICD-10-CM | POA: Diagnosis not present

## 2016-02-15 DIAGNOSIS — I872 Venous insufficiency (chronic) (peripheral): Secondary | ICD-10-CM | POA: Diagnosis not present

## 2016-02-15 DIAGNOSIS — L97221 Non-pressure chronic ulcer of left calf limited to breakdown of skin: Secondary | ICD-10-CM | POA: Diagnosis not present

## 2016-02-15 DIAGNOSIS — I4891 Unspecified atrial fibrillation: Secondary | ICD-10-CM | POA: Diagnosis not present

## 2016-02-15 DIAGNOSIS — Z7901 Long term (current) use of anticoagulants: Secondary | ICD-10-CM | POA: Diagnosis not present

## 2016-02-15 DIAGNOSIS — I110 Hypertensive heart disease with heart failure: Secondary | ICD-10-CM | POA: Diagnosis not present

## 2016-02-15 DIAGNOSIS — M109 Gout, unspecified: Secondary | ICD-10-CM | POA: Diagnosis not present

## 2016-02-15 DIAGNOSIS — L97211 Non-pressure chronic ulcer of right calf limited to breakdown of skin: Secondary | ICD-10-CM | POA: Diagnosis not present

## 2016-02-15 DIAGNOSIS — I5030 Unspecified diastolic (congestive) heart failure: Secondary | ICD-10-CM | POA: Diagnosis not present

## 2016-02-15 DIAGNOSIS — I890 Lymphedema, not elsewhere classified: Secondary | ICD-10-CM | POA: Diagnosis not present

## 2016-02-19 DIAGNOSIS — L97221 Non-pressure chronic ulcer of left calf limited to breakdown of skin: Secondary | ICD-10-CM | POA: Diagnosis not present

## 2016-02-19 DIAGNOSIS — M109 Gout, unspecified: Secondary | ICD-10-CM | POA: Diagnosis not present

## 2016-02-19 DIAGNOSIS — I4891 Unspecified atrial fibrillation: Secondary | ICD-10-CM | POA: Diagnosis not present

## 2016-02-19 DIAGNOSIS — L97211 Non-pressure chronic ulcer of right calf limited to breakdown of skin: Secondary | ICD-10-CM | POA: Diagnosis not present

## 2016-02-19 DIAGNOSIS — I110 Hypertensive heart disease with heart failure: Secondary | ICD-10-CM | POA: Diagnosis not present

## 2016-02-19 DIAGNOSIS — I872 Venous insufficiency (chronic) (peripheral): Secondary | ICD-10-CM | POA: Diagnosis not present

## 2016-02-19 DIAGNOSIS — I890 Lymphedema, not elsewhere classified: Secondary | ICD-10-CM | POA: Diagnosis not present

## 2016-02-19 DIAGNOSIS — I5030 Unspecified diastolic (congestive) heart failure: Secondary | ICD-10-CM | POA: Diagnosis not present

## 2016-02-19 DIAGNOSIS — Z7901 Long term (current) use of anticoagulants: Secondary | ICD-10-CM | POA: Diagnosis not present

## 2016-02-22 DIAGNOSIS — Z7901 Long term (current) use of anticoagulants: Secondary | ICD-10-CM | POA: Diagnosis not present

## 2016-02-22 DIAGNOSIS — I872 Venous insufficiency (chronic) (peripheral): Secondary | ICD-10-CM | POA: Diagnosis not present

## 2016-02-22 DIAGNOSIS — I110 Hypertensive heart disease with heart failure: Secondary | ICD-10-CM | POA: Diagnosis not present

## 2016-02-22 DIAGNOSIS — L97211 Non-pressure chronic ulcer of right calf limited to breakdown of skin: Secondary | ICD-10-CM | POA: Diagnosis not present

## 2016-02-22 DIAGNOSIS — I890 Lymphedema, not elsewhere classified: Secondary | ICD-10-CM | POA: Diagnosis not present

## 2016-02-22 DIAGNOSIS — I5030 Unspecified diastolic (congestive) heart failure: Secondary | ICD-10-CM | POA: Diagnosis not present

## 2016-02-22 DIAGNOSIS — M109 Gout, unspecified: Secondary | ICD-10-CM | POA: Diagnosis not present

## 2016-02-22 DIAGNOSIS — L97221 Non-pressure chronic ulcer of left calf limited to breakdown of skin: Secondary | ICD-10-CM | POA: Diagnosis not present

## 2016-02-22 DIAGNOSIS — I4891 Unspecified atrial fibrillation: Secondary | ICD-10-CM | POA: Diagnosis not present

## 2016-02-26 DIAGNOSIS — L97221 Non-pressure chronic ulcer of left calf limited to breakdown of skin: Secondary | ICD-10-CM | POA: Diagnosis not present

## 2016-02-26 DIAGNOSIS — I110 Hypertensive heart disease with heart failure: Secondary | ICD-10-CM | POA: Diagnosis not present

## 2016-02-26 DIAGNOSIS — I5030 Unspecified diastolic (congestive) heart failure: Secondary | ICD-10-CM | POA: Diagnosis not present

## 2016-02-26 DIAGNOSIS — I872 Venous insufficiency (chronic) (peripheral): Secondary | ICD-10-CM | POA: Diagnosis not present

## 2016-02-26 DIAGNOSIS — L97211 Non-pressure chronic ulcer of right calf limited to breakdown of skin: Secondary | ICD-10-CM | POA: Diagnosis not present

## 2016-02-26 DIAGNOSIS — M109 Gout, unspecified: Secondary | ICD-10-CM | POA: Diagnosis not present

## 2016-02-26 DIAGNOSIS — Z7901 Long term (current) use of anticoagulants: Secondary | ICD-10-CM | POA: Diagnosis not present

## 2016-02-26 DIAGNOSIS — I890 Lymphedema, not elsewhere classified: Secondary | ICD-10-CM | POA: Diagnosis not present

## 2016-02-26 DIAGNOSIS — I4891 Unspecified atrial fibrillation: Secondary | ICD-10-CM | POA: Diagnosis not present

## 2016-02-28 DIAGNOSIS — I10 Essential (primary) hypertension: Secondary | ICD-10-CM | POA: Diagnosis not present

## 2016-02-28 DIAGNOSIS — I509 Heart failure, unspecified: Secondary | ICD-10-CM | POA: Diagnosis not present

## 2016-02-28 DIAGNOSIS — R5383 Other fatigue: Secondary | ICD-10-CM | POA: Diagnosis not present

## 2016-02-29 DIAGNOSIS — I872 Venous insufficiency (chronic) (peripheral): Secondary | ICD-10-CM | POA: Diagnosis not present

## 2016-02-29 DIAGNOSIS — I4891 Unspecified atrial fibrillation: Secondary | ICD-10-CM | POA: Diagnosis not present

## 2016-02-29 DIAGNOSIS — I5030 Unspecified diastolic (congestive) heart failure: Secondary | ICD-10-CM | POA: Diagnosis not present

## 2016-02-29 DIAGNOSIS — M109 Gout, unspecified: Secondary | ICD-10-CM | POA: Diagnosis not present

## 2016-02-29 DIAGNOSIS — Z7901 Long term (current) use of anticoagulants: Secondary | ICD-10-CM | POA: Diagnosis not present

## 2016-02-29 DIAGNOSIS — L97211 Non-pressure chronic ulcer of right calf limited to breakdown of skin: Secondary | ICD-10-CM | POA: Diagnosis not present

## 2016-02-29 DIAGNOSIS — I110 Hypertensive heart disease with heart failure: Secondary | ICD-10-CM | POA: Diagnosis not present

## 2016-02-29 DIAGNOSIS — L97221 Non-pressure chronic ulcer of left calf limited to breakdown of skin: Secondary | ICD-10-CM | POA: Diagnosis not present

## 2016-02-29 DIAGNOSIS — I890 Lymphedema, not elsewhere classified: Secondary | ICD-10-CM | POA: Diagnosis not present

## 2016-03-04 DIAGNOSIS — I110 Hypertensive heart disease with heart failure: Secondary | ICD-10-CM | POA: Diagnosis not present

## 2016-03-04 DIAGNOSIS — I5030 Unspecified diastolic (congestive) heart failure: Secondary | ICD-10-CM | POA: Diagnosis not present

## 2016-03-04 DIAGNOSIS — Z7901 Long term (current) use of anticoagulants: Secondary | ICD-10-CM | POA: Diagnosis not present

## 2016-03-04 DIAGNOSIS — I4891 Unspecified atrial fibrillation: Secondary | ICD-10-CM | POA: Diagnosis not present

## 2016-03-04 DIAGNOSIS — I872 Venous insufficiency (chronic) (peripheral): Secondary | ICD-10-CM | POA: Diagnosis not present

## 2016-03-04 DIAGNOSIS — L97211 Non-pressure chronic ulcer of right calf limited to breakdown of skin: Secondary | ICD-10-CM | POA: Diagnosis not present

## 2016-03-04 DIAGNOSIS — M109 Gout, unspecified: Secondary | ICD-10-CM | POA: Diagnosis not present

## 2016-03-04 DIAGNOSIS — L97221 Non-pressure chronic ulcer of left calf limited to breakdown of skin: Secondary | ICD-10-CM | POA: Diagnosis not present

## 2016-03-04 DIAGNOSIS — I890 Lymphedema, not elsewhere classified: Secondary | ICD-10-CM | POA: Diagnosis not present

## 2016-03-07 DIAGNOSIS — Z7901 Long term (current) use of anticoagulants: Secondary | ICD-10-CM | POA: Diagnosis not present

## 2016-03-07 DIAGNOSIS — I890 Lymphedema, not elsewhere classified: Secondary | ICD-10-CM | POA: Diagnosis not present

## 2016-03-07 DIAGNOSIS — I4891 Unspecified atrial fibrillation: Secondary | ICD-10-CM | POA: Diagnosis not present

## 2016-03-07 DIAGNOSIS — I110 Hypertensive heart disease with heart failure: Secondary | ICD-10-CM | POA: Diagnosis not present

## 2016-03-07 DIAGNOSIS — I5030 Unspecified diastolic (congestive) heart failure: Secondary | ICD-10-CM | POA: Diagnosis not present

## 2016-03-07 DIAGNOSIS — I872 Venous insufficiency (chronic) (peripheral): Secondary | ICD-10-CM | POA: Diagnosis not present

## 2016-03-07 DIAGNOSIS — L97211 Non-pressure chronic ulcer of right calf limited to breakdown of skin: Secondary | ICD-10-CM | POA: Diagnosis not present

## 2016-03-07 DIAGNOSIS — L97221 Non-pressure chronic ulcer of left calf limited to breakdown of skin: Secondary | ICD-10-CM | POA: Diagnosis not present

## 2016-03-07 DIAGNOSIS — M109 Gout, unspecified: Secondary | ICD-10-CM | POA: Diagnosis not present

## 2016-03-11 DIAGNOSIS — L97221 Non-pressure chronic ulcer of left calf limited to breakdown of skin: Secondary | ICD-10-CM | POA: Diagnosis not present

## 2016-03-11 DIAGNOSIS — I890 Lymphedema, not elsewhere classified: Secondary | ICD-10-CM | POA: Diagnosis not present

## 2016-03-11 DIAGNOSIS — I5030 Unspecified diastolic (congestive) heart failure: Secondary | ICD-10-CM | POA: Diagnosis not present

## 2016-03-11 DIAGNOSIS — M109 Gout, unspecified: Secondary | ICD-10-CM | POA: Diagnosis not present

## 2016-03-11 DIAGNOSIS — Z7901 Long term (current) use of anticoagulants: Secondary | ICD-10-CM | POA: Diagnosis not present

## 2016-03-11 DIAGNOSIS — I110 Hypertensive heart disease with heart failure: Secondary | ICD-10-CM | POA: Diagnosis not present

## 2016-03-11 DIAGNOSIS — I4891 Unspecified atrial fibrillation: Secondary | ICD-10-CM | POA: Diagnosis not present

## 2016-03-11 DIAGNOSIS — L97211 Non-pressure chronic ulcer of right calf limited to breakdown of skin: Secondary | ICD-10-CM | POA: Diagnosis not present

## 2016-03-11 DIAGNOSIS — I872 Venous insufficiency (chronic) (peripheral): Secondary | ICD-10-CM | POA: Diagnosis not present

## 2016-03-14 DIAGNOSIS — I872 Venous insufficiency (chronic) (peripheral): Secondary | ICD-10-CM | POA: Diagnosis not present

## 2016-03-14 DIAGNOSIS — I5030 Unspecified diastolic (congestive) heart failure: Secondary | ICD-10-CM | POA: Diagnosis not present

## 2016-03-14 DIAGNOSIS — Z7901 Long term (current) use of anticoagulants: Secondary | ICD-10-CM | POA: Diagnosis not present

## 2016-03-14 DIAGNOSIS — M109 Gout, unspecified: Secondary | ICD-10-CM | POA: Diagnosis not present

## 2016-03-14 DIAGNOSIS — L97211 Non-pressure chronic ulcer of right calf limited to breakdown of skin: Secondary | ICD-10-CM | POA: Diagnosis not present

## 2016-03-14 DIAGNOSIS — L97221 Non-pressure chronic ulcer of left calf limited to breakdown of skin: Secondary | ICD-10-CM | POA: Diagnosis not present

## 2016-03-14 DIAGNOSIS — I4891 Unspecified atrial fibrillation: Secondary | ICD-10-CM | POA: Diagnosis not present

## 2016-03-14 DIAGNOSIS — I890 Lymphedema, not elsewhere classified: Secondary | ICD-10-CM | POA: Diagnosis not present

## 2016-03-14 DIAGNOSIS — I110 Hypertensive heart disease with heart failure: Secondary | ICD-10-CM | POA: Diagnosis not present

## 2016-03-18 DIAGNOSIS — Z7901 Long term (current) use of anticoagulants: Secondary | ICD-10-CM | POA: Diagnosis not present

## 2016-03-18 DIAGNOSIS — I5030 Unspecified diastolic (congestive) heart failure: Secondary | ICD-10-CM | POA: Diagnosis not present

## 2016-03-18 DIAGNOSIS — L97211 Non-pressure chronic ulcer of right calf limited to breakdown of skin: Secondary | ICD-10-CM | POA: Diagnosis not present

## 2016-03-18 DIAGNOSIS — I110 Hypertensive heart disease with heart failure: Secondary | ICD-10-CM | POA: Diagnosis not present

## 2016-03-18 DIAGNOSIS — I872 Venous insufficiency (chronic) (peripheral): Secondary | ICD-10-CM | POA: Diagnosis not present

## 2016-03-18 DIAGNOSIS — I4891 Unspecified atrial fibrillation: Secondary | ICD-10-CM | POA: Diagnosis not present

## 2016-03-18 DIAGNOSIS — M109 Gout, unspecified: Secondary | ICD-10-CM | POA: Diagnosis not present

## 2016-03-18 DIAGNOSIS — L97221 Non-pressure chronic ulcer of left calf limited to breakdown of skin: Secondary | ICD-10-CM | POA: Diagnosis not present

## 2016-03-18 DIAGNOSIS — I890 Lymphedema, not elsewhere classified: Secondary | ICD-10-CM | POA: Diagnosis not present

## 2016-03-21 DIAGNOSIS — I4891 Unspecified atrial fibrillation: Secondary | ICD-10-CM | POA: Diagnosis not present

## 2016-03-21 DIAGNOSIS — I5030 Unspecified diastolic (congestive) heart failure: Secondary | ICD-10-CM | POA: Diagnosis not present

## 2016-03-21 DIAGNOSIS — I110 Hypertensive heart disease with heart failure: Secondary | ICD-10-CM | POA: Diagnosis not present

## 2016-03-21 DIAGNOSIS — M109 Gout, unspecified: Secondary | ICD-10-CM | POA: Diagnosis not present

## 2016-03-21 DIAGNOSIS — L97211 Non-pressure chronic ulcer of right calf limited to breakdown of skin: Secondary | ICD-10-CM | POA: Diagnosis not present

## 2016-03-21 DIAGNOSIS — I872 Venous insufficiency (chronic) (peripheral): Secondary | ICD-10-CM | POA: Diagnosis not present

## 2016-03-21 DIAGNOSIS — I890 Lymphedema, not elsewhere classified: Secondary | ICD-10-CM | POA: Diagnosis not present

## 2016-03-21 DIAGNOSIS — Z7901 Long term (current) use of anticoagulants: Secondary | ICD-10-CM | POA: Diagnosis not present

## 2016-03-21 DIAGNOSIS — L97221 Non-pressure chronic ulcer of left calf limited to breakdown of skin: Secondary | ICD-10-CM | POA: Diagnosis not present

## 2016-03-25 DIAGNOSIS — I872 Venous insufficiency (chronic) (peripheral): Secondary | ICD-10-CM | POA: Diagnosis not present

## 2016-03-25 DIAGNOSIS — I110 Hypertensive heart disease with heart failure: Secondary | ICD-10-CM | POA: Diagnosis not present

## 2016-03-25 DIAGNOSIS — L97211 Non-pressure chronic ulcer of right calf limited to breakdown of skin: Secondary | ICD-10-CM | POA: Diagnosis not present

## 2016-03-25 DIAGNOSIS — L97221 Non-pressure chronic ulcer of left calf limited to breakdown of skin: Secondary | ICD-10-CM | POA: Diagnosis not present

## 2016-03-25 DIAGNOSIS — M109 Gout, unspecified: Secondary | ICD-10-CM | POA: Diagnosis not present

## 2016-03-25 DIAGNOSIS — Z7901 Long term (current) use of anticoagulants: Secondary | ICD-10-CM | POA: Diagnosis not present

## 2016-03-25 DIAGNOSIS — I5030 Unspecified diastolic (congestive) heart failure: Secondary | ICD-10-CM | POA: Diagnosis not present

## 2016-03-25 DIAGNOSIS — I890 Lymphedema, not elsewhere classified: Secondary | ICD-10-CM | POA: Diagnosis not present

## 2016-03-25 DIAGNOSIS — I4891 Unspecified atrial fibrillation: Secondary | ICD-10-CM | POA: Diagnosis not present

## 2016-04-01 DIAGNOSIS — L97221 Non-pressure chronic ulcer of left calf limited to breakdown of skin: Secondary | ICD-10-CM | POA: Diagnosis not present

## 2016-04-01 DIAGNOSIS — I4891 Unspecified atrial fibrillation: Secondary | ICD-10-CM | POA: Diagnosis not present

## 2016-04-01 DIAGNOSIS — I872 Venous insufficiency (chronic) (peripheral): Secondary | ICD-10-CM | POA: Diagnosis not present

## 2016-04-01 DIAGNOSIS — I890 Lymphedema, not elsewhere classified: Secondary | ICD-10-CM | POA: Diagnosis not present

## 2016-04-01 DIAGNOSIS — M109 Gout, unspecified: Secondary | ICD-10-CM | POA: Diagnosis not present

## 2016-04-01 DIAGNOSIS — Z7901 Long term (current) use of anticoagulants: Secondary | ICD-10-CM | POA: Diagnosis not present

## 2016-04-01 DIAGNOSIS — I110 Hypertensive heart disease with heart failure: Secondary | ICD-10-CM | POA: Diagnosis not present

## 2016-04-01 DIAGNOSIS — L97211 Non-pressure chronic ulcer of right calf limited to breakdown of skin: Secondary | ICD-10-CM | POA: Diagnosis not present

## 2016-04-01 DIAGNOSIS — I5030 Unspecified diastolic (congestive) heart failure: Secondary | ICD-10-CM | POA: Diagnosis not present

## 2016-04-04 DIAGNOSIS — R7301 Impaired fasting glucose: Secondary | ICD-10-CM | POA: Diagnosis not present

## 2016-04-04 DIAGNOSIS — E782 Mixed hyperlipidemia: Secondary | ICD-10-CM | POA: Diagnosis not present

## 2016-04-04 DIAGNOSIS — I10 Essential (primary) hypertension: Secondary | ICD-10-CM | POA: Diagnosis not present

## 2016-04-07 DIAGNOSIS — I4891 Unspecified atrial fibrillation: Secondary | ICD-10-CM | POA: Diagnosis not present

## 2016-04-07 DIAGNOSIS — I509 Heart failure, unspecified: Secondary | ICD-10-CM | POA: Diagnosis not present

## 2016-04-07 DIAGNOSIS — I10 Essential (primary) hypertension: Secondary | ICD-10-CM | POA: Diagnosis not present

## 2016-04-08 DIAGNOSIS — I5030 Unspecified diastolic (congestive) heart failure: Secondary | ICD-10-CM | POA: Diagnosis not present

## 2016-04-08 DIAGNOSIS — I872 Venous insufficiency (chronic) (peripheral): Secondary | ICD-10-CM | POA: Diagnosis not present

## 2016-04-08 DIAGNOSIS — I110 Hypertensive heart disease with heart failure: Secondary | ICD-10-CM | POA: Diagnosis not present

## 2016-04-08 DIAGNOSIS — L97211 Non-pressure chronic ulcer of right calf limited to breakdown of skin: Secondary | ICD-10-CM | POA: Diagnosis not present

## 2016-04-08 DIAGNOSIS — L97221 Non-pressure chronic ulcer of left calf limited to breakdown of skin: Secondary | ICD-10-CM | POA: Diagnosis not present

## 2016-04-08 DIAGNOSIS — I890 Lymphedema, not elsewhere classified: Secondary | ICD-10-CM | POA: Diagnosis not present

## 2016-04-08 DIAGNOSIS — M109 Gout, unspecified: Secondary | ICD-10-CM | POA: Diagnosis not present

## 2016-04-08 DIAGNOSIS — I4891 Unspecified atrial fibrillation: Secondary | ICD-10-CM | POA: Diagnosis not present

## 2016-04-08 DIAGNOSIS — Z7901 Long term (current) use of anticoagulants: Secondary | ICD-10-CM | POA: Diagnosis not present

## 2016-04-11 DIAGNOSIS — L97211 Non-pressure chronic ulcer of right calf limited to breakdown of skin: Secondary | ICD-10-CM | POA: Diagnosis not present

## 2016-04-11 DIAGNOSIS — I5030 Unspecified diastolic (congestive) heart failure: Secondary | ICD-10-CM | POA: Diagnosis not present

## 2016-04-11 DIAGNOSIS — I4891 Unspecified atrial fibrillation: Secondary | ICD-10-CM | POA: Diagnosis not present

## 2016-04-11 DIAGNOSIS — M109 Gout, unspecified: Secondary | ICD-10-CM | POA: Diagnosis not present

## 2016-04-11 DIAGNOSIS — L97221 Non-pressure chronic ulcer of left calf limited to breakdown of skin: Secondary | ICD-10-CM | POA: Diagnosis not present

## 2016-04-11 DIAGNOSIS — I110 Hypertensive heart disease with heart failure: Secondary | ICD-10-CM | POA: Diagnosis not present

## 2016-04-11 DIAGNOSIS — Z7901 Long term (current) use of anticoagulants: Secondary | ICD-10-CM | POA: Diagnosis not present

## 2016-04-11 DIAGNOSIS — I872 Venous insufficiency (chronic) (peripheral): Secondary | ICD-10-CM | POA: Diagnosis not present

## 2016-04-11 DIAGNOSIS — I890 Lymphedema, not elsewhere classified: Secondary | ICD-10-CM | POA: Diagnosis not present

## 2016-04-15 DIAGNOSIS — I4891 Unspecified atrial fibrillation: Secondary | ICD-10-CM | POA: Diagnosis not present

## 2016-04-15 DIAGNOSIS — I5030 Unspecified diastolic (congestive) heart failure: Secondary | ICD-10-CM | POA: Diagnosis not present

## 2016-04-15 DIAGNOSIS — L97221 Non-pressure chronic ulcer of left calf limited to breakdown of skin: Secondary | ICD-10-CM | POA: Diagnosis not present

## 2016-04-15 DIAGNOSIS — I872 Venous insufficiency (chronic) (peripheral): Secondary | ICD-10-CM | POA: Diagnosis not present

## 2016-04-15 DIAGNOSIS — I110 Hypertensive heart disease with heart failure: Secondary | ICD-10-CM | POA: Diagnosis not present

## 2016-04-15 DIAGNOSIS — I890 Lymphedema, not elsewhere classified: Secondary | ICD-10-CM | POA: Diagnosis not present

## 2016-04-15 DIAGNOSIS — L97211 Non-pressure chronic ulcer of right calf limited to breakdown of skin: Secondary | ICD-10-CM | POA: Diagnosis not present

## 2016-04-15 DIAGNOSIS — M109 Gout, unspecified: Secondary | ICD-10-CM | POA: Diagnosis not present

## 2016-04-15 DIAGNOSIS — Z7901 Long term (current) use of anticoagulants: Secondary | ICD-10-CM | POA: Diagnosis not present

## 2016-04-18 DIAGNOSIS — Z7901 Long term (current) use of anticoagulants: Secondary | ICD-10-CM | POA: Diagnosis not present

## 2016-04-18 DIAGNOSIS — L97211 Non-pressure chronic ulcer of right calf limited to breakdown of skin: Secondary | ICD-10-CM | POA: Diagnosis not present

## 2016-04-18 DIAGNOSIS — I110 Hypertensive heart disease with heart failure: Secondary | ICD-10-CM | POA: Diagnosis not present

## 2016-04-18 DIAGNOSIS — I5030 Unspecified diastolic (congestive) heart failure: Secondary | ICD-10-CM | POA: Diagnosis not present

## 2016-04-18 DIAGNOSIS — I890 Lymphedema, not elsewhere classified: Secondary | ICD-10-CM | POA: Diagnosis not present

## 2016-04-18 DIAGNOSIS — L97221 Non-pressure chronic ulcer of left calf limited to breakdown of skin: Secondary | ICD-10-CM | POA: Diagnosis not present

## 2016-04-18 DIAGNOSIS — I872 Venous insufficiency (chronic) (peripheral): Secondary | ICD-10-CM | POA: Diagnosis not present

## 2016-04-18 DIAGNOSIS — I4891 Unspecified atrial fibrillation: Secondary | ICD-10-CM | POA: Diagnosis not present

## 2016-04-18 DIAGNOSIS — M109 Gout, unspecified: Secondary | ICD-10-CM | POA: Diagnosis not present

## 2016-04-22 DIAGNOSIS — I872 Venous insufficiency (chronic) (peripheral): Secondary | ICD-10-CM | POA: Diagnosis not present

## 2016-04-22 DIAGNOSIS — I4891 Unspecified atrial fibrillation: Secondary | ICD-10-CM | POA: Diagnosis not present

## 2016-04-22 DIAGNOSIS — L97221 Non-pressure chronic ulcer of left calf limited to breakdown of skin: Secondary | ICD-10-CM | POA: Diagnosis not present

## 2016-04-22 DIAGNOSIS — Z7901 Long term (current) use of anticoagulants: Secondary | ICD-10-CM | POA: Diagnosis not present

## 2016-04-22 DIAGNOSIS — M109 Gout, unspecified: Secondary | ICD-10-CM | POA: Diagnosis not present

## 2016-04-22 DIAGNOSIS — L97211 Non-pressure chronic ulcer of right calf limited to breakdown of skin: Secondary | ICD-10-CM | POA: Diagnosis not present

## 2016-04-22 DIAGNOSIS — I110 Hypertensive heart disease with heart failure: Secondary | ICD-10-CM | POA: Diagnosis not present

## 2016-04-22 DIAGNOSIS — I890 Lymphedema, not elsewhere classified: Secondary | ICD-10-CM | POA: Diagnosis not present

## 2016-04-22 DIAGNOSIS — I5030 Unspecified diastolic (congestive) heart failure: Secondary | ICD-10-CM | POA: Diagnosis not present

## 2016-04-25 DIAGNOSIS — Z7901 Long term (current) use of anticoagulants: Secondary | ICD-10-CM | POA: Diagnosis not present

## 2016-04-25 DIAGNOSIS — L97211 Non-pressure chronic ulcer of right calf limited to breakdown of skin: Secondary | ICD-10-CM | POA: Diagnosis not present

## 2016-04-25 DIAGNOSIS — I4891 Unspecified atrial fibrillation: Secondary | ICD-10-CM | POA: Diagnosis not present

## 2016-04-25 DIAGNOSIS — L97221 Non-pressure chronic ulcer of left calf limited to breakdown of skin: Secondary | ICD-10-CM | POA: Diagnosis not present

## 2016-04-25 DIAGNOSIS — I5030 Unspecified diastolic (congestive) heart failure: Secondary | ICD-10-CM | POA: Diagnosis not present

## 2016-04-25 DIAGNOSIS — I872 Venous insufficiency (chronic) (peripheral): Secondary | ICD-10-CM | POA: Diagnosis not present

## 2016-04-25 DIAGNOSIS — I110 Hypertensive heart disease with heart failure: Secondary | ICD-10-CM | POA: Diagnosis not present

## 2016-04-25 DIAGNOSIS — M109 Gout, unspecified: Secondary | ICD-10-CM | POA: Diagnosis not present

## 2016-04-25 DIAGNOSIS — I890 Lymphedema, not elsewhere classified: Secondary | ICD-10-CM | POA: Diagnosis not present

## 2016-04-29 DIAGNOSIS — I110 Hypertensive heart disease with heart failure: Secondary | ICD-10-CM | POA: Diagnosis not present

## 2016-04-29 DIAGNOSIS — I872 Venous insufficiency (chronic) (peripheral): Secondary | ICD-10-CM | POA: Diagnosis not present

## 2016-04-29 DIAGNOSIS — L97211 Non-pressure chronic ulcer of right calf limited to breakdown of skin: Secondary | ICD-10-CM | POA: Diagnosis not present

## 2016-04-29 DIAGNOSIS — Z7901 Long term (current) use of anticoagulants: Secondary | ICD-10-CM | POA: Diagnosis not present

## 2016-04-29 DIAGNOSIS — I890 Lymphedema, not elsewhere classified: Secondary | ICD-10-CM | POA: Diagnosis not present

## 2016-04-29 DIAGNOSIS — M109 Gout, unspecified: Secondary | ICD-10-CM | POA: Diagnosis not present

## 2016-04-29 DIAGNOSIS — L97221 Non-pressure chronic ulcer of left calf limited to breakdown of skin: Secondary | ICD-10-CM | POA: Diagnosis not present

## 2016-04-29 DIAGNOSIS — I5030 Unspecified diastolic (congestive) heart failure: Secondary | ICD-10-CM | POA: Diagnosis not present

## 2016-04-29 DIAGNOSIS — I4891 Unspecified atrial fibrillation: Secondary | ICD-10-CM | POA: Diagnosis not present

## 2016-05-02 DIAGNOSIS — I872 Venous insufficiency (chronic) (peripheral): Secondary | ICD-10-CM | POA: Diagnosis not present

## 2016-05-02 DIAGNOSIS — I4891 Unspecified atrial fibrillation: Secondary | ICD-10-CM | POA: Diagnosis not present

## 2016-05-02 DIAGNOSIS — I110 Hypertensive heart disease with heart failure: Secondary | ICD-10-CM | POA: Diagnosis not present

## 2016-05-02 DIAGNOSIS — L97221 Non-pressure chronic ulcer of left calf limited to breakdown of skin: Secondary | ICD-10-CM | POA: Diagnosis not present

## 2016-05-02 DIAGNOSIS — I5030 Unspecified diastolic (congestive) heart failure: Secondary | ICD-10-CM | POA: Diagnosis not present

## 2016-05-02 DIAGNOSIS — I890 Lymphedema, not elsewhere classified: Secondary | ICD-10-CM | POA: Diagnosis not present

## 2016-05-02 DIAGNOSIS — M109 Gout, unspecified: Secondary | ICD-10-CM | POA: Diagnosis not present

## 2016-05-02 DIAGNOSIS — Z7901 Long term (current) use of anticoagulants: Secondary | ICD-10-CM | POA: Diagnosis not present

## 2016-05-02 DIAGNOSIS — L97211 Non-pressure chronic ulcer of right calf limited to breakdown of skin: Secondary | ICD-10-CM | POA: Diagnosis not present

## 2016-05-06 DIAGNOSIS — I4891 Unspecified atrial fibrillation: Secondary | ICD-10-CM | POA: Diagnosis not present

## 2016-05-06 DIAGNOSIS — L97211 Non-pressure chronic ulcer of right calf limited to breakdown of skin: Secondary | ICD-10-CM | POA: Diagnosis not present

## 2016-05-06 DIAGNOSIS — M109 Gout, unspecified: Secondary | ICD-10-CM | POA: Diagnosis not present

## 2016-05-06 DIAGNOSIS — I5030 Unspecified diastolic (congestive) heart failure: Secondary | ICD-10-CM | POA: Diagnosis not present

## 2016-05-06 DIAGNOSIS — I872 Venous insufficiency (chronic) (peripheral): Secondary | ICD-10-CM | POA: Diagnosis not present

## 2016-05-06 DIAGNOSIS — I110 Hypertensive heart disease with heart failure: Secondary | ICD-10-CM | POA: Diagnosis not present

## 2016-05-06 DIAGNOSIS — Z7901 Long term (current) use of anticoagulants: Secondary | ICD-10-CM | POA: Diagnosis not present

## 2016-05-06 DIAGNOSIS — L97221 Non-pressure chronic ulcer of left calf limited to breakdown of skin: Secondary | ICD-10-CM | POA: Diagnosis not present

## 2016-05-06 DIAGNOSIS — I890 Lymphedema, not elsewhere classified: Secondary | ICD-10-CM | POA: Diagnosis not present

## 2016-05-09 DIAGNOSIS — I5030 Unspecified diastolic (congestive) heart failure: Secondary | ICD-10-CM | POA: Diagnosis not present

## 2016-05-09 DIAGNOSIS — I110 Hypertensive heart disease with heart failure: Secondary | ICD-10-CM | POA: Diagnosis not present

## 2016-05-09 DIAGNOSIS — I4891 Unspecified atrial fibrillation: Secondary | ICD-10-CM | POA: Diagnosis not present

## 2016-05-09 DIAGNOSIS — I872 Venous insufficiency (chronic) (peripheral): Secondary | ICD-10-CM | POA: Diagnosis not present

## 2016-05-09 DIAGNOSIS — L97221 Non-pressure chronic ulcer of left calf limited to breakdown of skin: Secondary | ICD-10-CM | POA: Diagnosis not present

## 2016-05-09 DIAGNOSIS — L97211 Non-pressure chronic ulcer of right calf limited to breakdown of skin: Secondary | ICD-10-CM | POA: Diagnosis not present

## 2016-05-09 DIAGNOSIS — I890 Lymphedema, not elsewhere classified: Secondary | ICD-10-CM | POA: Diagnosis not present

## 2016-05-09 DIAGNOSIS — Z7901 Long term (current) use of anticoagulants: Secondary | ICD-10-CM | POA: Diagnosis not present

## 2016-05-09 DIAGNOSIS — M109 Gout, unspecified: Secondary | ICD-10-CM | POA: Diagnosis not present

## 2016-05-13 DIAGNOSIS — Z7901 Long term (current) use of anticoagulants: Secondary | ICD-10-CM | POA: Diagnosis not present

## 2016-05-13 DIAGNOSIS — I5030 Unspecified diastolic (congestive) heart failure: Secondary | ICD-10-CM | POA: Diagnosis not present

## 2016-05-13 DIAGNOSIS — I872 Venous insufficiency (chronic) (peripheral): Secondary | ICD-10-CM | POA: Diagnosis not present

## 2016-05-13 DIAGNOSIS — I110 Hypertensive heart disease with heart failure: Secondary | ICD-10-CM | POA: Diagnosis not present

## 2016-05-13 DIAGNOSIS — I890 Lymphedema, not elsewhere classified: Secondary | ICD-10-CM | POA: Diagnosis not present

## 2016-05-13 DIAGNOSIS — L97221 Non-pressure chronic ulcer of left calf limited to breakdown of skin: Secondary | ICD-10-CM | POA: Diagnosis not present

## 2016-05-13 DIAGNOSIS — M109 Gout, unspecified: Secondary | ICD-10-CM | POA: Diagnosis not present

## 2016-05-13 DIAGNOSIS — I4891 Unspecified atrial fibrillation: Secondary | ICD-10-CM | POA: Diagnosis not present

## 2016-05-13 DIAGNOSIS — L97211 Non-pressure chronic ulcer of right calf limited to breakdown of skin: Secondary | ICD-10-CM | POA: Diagnosis not present

## 2016-05-16 DIAGNOSIS — I890 Lymphedema, not elsewhere classified: Secondary | ICD-10-CM | POA: Diagnosis not present

## 2016-05-16 DIAGNOSIS — I4891 Unspecified atrial fibrillation: Secondary | ICD-10-CM | POA: Diagnosis not present

## 2016-05-16 DIAGNOSIS — I5030 Unspecified diastolic (congestive) heart failure: Secondary | ICD-10-CM | POA: Diagnosis not present

## 2016-05-16 DIAGNOSIS — L97211 Non-pressure chronic ulcer of right calf limited to breakdown of skin: Secondary | ICD-10-CM | POA: Diagnosis not present

## 2016-05-16 DIAGNOSIS — Z7901 Long term (current) use of anticoagulants: Secondary | ICD-10-CM | POA: Diagnosis not present

## 2016-05-16 DIAGNOSIS — I110 Hypertensive heart disease with heart failure: Secondary | ICD-10-CM | POA: Diagnosis not present

## 2016-05-16 DIAGNOSIS — I872 Venous insufficiency (chronic) (peripheral): Secondary | ICD-10-CM | POA: Diagnosis not present

## 2016-05-16 DIAGNOSIS — M109 Gout, unspecified: Secondary | ICD-10-CM | POA: Diagnosis not present

## 2016-05-16 DIAGNOSIS — L97221 Non-pressure chronic ulcer of left calf limited to breakdown of skin: Secondary | ICD-10-CM | POA: Diagnosis not present

## 2016-05-20 DIAGNOSIS — I5030 Unspecified diastolic (congestive) heart failure: Secondary | ICD-10-CM | POA: Diagnosis not present

## 2016-05-20 DIAGNOSIS — L97211 Non-pressure chronic ulcer of right calf limited to breakdown of skin: Secondary | ICD-10-CM | POA: Diagnosis not present

## 2016-05-20 DIAGNOSIS — Z7901 Long term (current) use of anticoagulants: Secondary | ICD-10-CM | POA: Diagnosis not present

## 2016-05-20 DIAGNOSIS — M109 Gout, unspecified: Secondary | ICD-10-CM | POA: Diagnosis not present

## 2016-05-20 DIAGNOSIS — L97221 Non-pressure chronic ulcer of left calf limited to breakdown of skin: Secondary | ICD-10-CM | POA: Diagnosis not present

## 2016-05-20 DIAGNOSIS — I4891 Unspecified atrial fibrillation: Secondary | ICD-10-CM | POA: Diagnosis not present

## 2016-05-20 DIAGNOSIS — I872 Venous insufficiency (chronic) (peripheral): Secondary | ICD-10-CM | POA: Diagnosis not present

## 2016-05-20 DIAGNOSIS — I110 Hypertensive heart disease with heart failure: Secondary | ICD-10-CM | POA: Diagnosis not present

## 2016-05-20 DIAGNOSIS — I890 Lymphedema, not elsewhere classified: Secondary | ICD-10-CM | POA: Diagnosis not present

## 2016-05-23 DIAGNOSIS — L97221 Non-pressure chronic ulcer of left calf limited to breakdown of skin: Secondary | ICD-10-CM | POA: Diagnosis not present

## 2016-05-23 DIAGNOSIS — M109 Gout, unspecified: Secondary | ICD-10-CM | POA: Diagnosis not present

## 2016-05-23 DIAGNOSIS — I110 Hypertensive heart disease with heart failure: Secondary | ICD-10-CM | POA: Diagnosis not present

## 2016-05-23 DIAGNOSIS — I872 Venous insufficiency (chronic) (peripheral): Secondary | ICD-10-CM | POA: Diagnosis not present

## 2016-05-23 DIAGNOSIS — L97211 Non-pressure chronic ulcer of right calf limited to breakdown of skin: Secondary | ICD-10-CM | POA: Diagnosis not present

## 2016-05-23 DIAGNOSIS — I890 Lymphedema, not elsewhere classified: Secondary | ICD-10-CM | POA: Diagnosis not present

## 2016-05-23 DIAGNOSIS — Z7901 Long term (current) use of anticoagulants: Secondary | ICD-10-CM | POA: Diagnosis not present

## 2016-05-23 DIAGNOSIS — I4891 Unspecified atrial fibrillation: Secondary | ICD-10-CM | POA: Diagnosis not present

## 2016-05-23 DIAGNOSIS — I5030 Unspecified diastolic (congestive) heart failure: Secondary | ICD-10-CM | POA: Diagnosis not present

## 2016-05-27 DIAGNOSIS — M109 Gout, unspecified: Secondary | ICD-10-CM | POA: Diagnosis not present

## 2016-05-27 DIAGNOSIS — Z7901 Long term (current) use of anticoagulants: Secondary | ICD-10-CM | POA: Diagnosis not present

## 2016-05-27 DIAGNOSIS — I110 Hypertensive heart disease with heart failure: Secondary | ICD-10-CM | POA: Diagnosis not present

## 2016-05-27 DIAGNOSIS — I872 Venous insufficiency (chronic) (peripheral): Secondary | ICD-10-CM | POA: Diagnosis not present

## 2016-05-27 DIAGNOSIS — I4891 Unspecified atrial fibrillation: Secondary | ICD-10-CM | POA: Diagnosis not present

## 2016-05-27 DIAGNOSIS — L97211 Non-pressure chronic ulcer of right calf limited to breakdown of skin: Secondary | ICD-10-CM | POA: Diagnosis not present

## 2016-05-27 DIAGNOSIS — I5030 Unspecified diastolic (congestive) heart failure: Secondary | ICD-10-CM | POA: Diagnosis not present

## 2016-05-27 DIAGNOSIS — L97221 Non-pressure chronic ulcer of left calf limited to breakdown of skin: Secondary | ICD-10-CM | POA: Diagnosis not present

## 2016-05-27 DIAGNOSIS — I890 Lymphedema, not elsewhere classified: Secondary | ICD-10-CM | POA: Diagnosis not present

## 2016-05-28 DIAGNOSIS — L603 Nail dystrophy: Secondary | ICD-10-CM | POA: Diagnosis not present

## 2016-05-30 DIAGNOSIS — I110 Hypertensive heart disease with heart failure: Secondary | ICD-10-CM | POA: Diagnosis not present

## 2016-05-30 DIAGNOSIS — I872 Venous insufficiency (chronic) (peripheral): Secondary | ICD-10-CM | POA: Diagnosis not present

## 2016-05-30 DIAGNOSIS — M109 Gout, unspecified: Secondary | ICD-10-CM | POA: Diagnosis not present

## 2016-05-30 DIAGNOSIS — I4891 Unspecified atrial fibrillation: Secondary | ICD-10-CM | POA: Diagnosis not present

## 2016-05-30 DIAGNOSIS — I890 Lymphedema, not elsewhere classified: Secondary | ICD-10-CM | POA: Diagnosis not present

## 2016-05-30 DIAGNOSIS — I5030 Unspecified diastolic (congestive) heart failure: Secondary | ICD-10-CM | POA: Diagnosis not present

## 2016-05-30 DIAGNOSIS — Z7901 Long term (current) use of anticoagulants: Secondary | ICD-10-CM | POA: Diagnosis not present

## 2016-05-30 DIAGNOSIS — L97221 Non-pressure chronic ulcer of left calf limited to breakdown of skin: Secondary | ICD-10-CM | POA: Diagnosis not present

## 2016-05-30 DIAGNOSIS — L97211 Non-pressure chronic ulcer of right calf limited to breakdown of skin: Secondary | ICD-10-CM | POA: Diagnosis not present

## 2016-06-02 DIAGNOSIS — I872 Venous insufficiency (chronic) (peripheral): Secondary | ICD-10-CM | POA: Diagnosis not present

## 2016-06-02 DIAGNOSIS — I5030 Unspecified diastolic (congestive) heart failure: Secondary | ICD-10-CM | POA: Diagnosis not present

## 2016-06-02 DIAGNOSIS — I110 Hypertensive heart disease with heart failure: Secondary | ICD-10-CM | POA: Diagnosis not present

## 2016-06-02 DIAGNOSIS — Z7901 Long term (current) use of anticoagulants: Secondary | ICD-10-CM | POA: Diagnosis not present

## 2016-06-02 DIAGNOSIS — L97211 Non-pressure chronic ulcer of right calf limited to breakdown of skin: Secondary | ICD-10-CM | POA: Diagnosis not present

## 2016-06-02 DIAGNOSIS — I890 Lymphedema, not elsewhere classified: Secondary | ICD-10-CM | POA: Diagnosis not present

## 2016-06-02 DIAGNOSIS — L97221 Non-pressure chronic ulcer of left calf limited to breakdown of skin: Secondary | ICD-10-CM | POA: Diagnosis not present

## 2016-06-02 DIAGNOSIS — I4891 Unspecified atrial fibrillation: Secondary | ICD-10-CM | POA: Diagnosis not present

## 2016-06-02 DIAGNOSIS — M109 Gout, unspecified: Secondary | ICD-10-CM | POA: Diagnosis not present

## 2016-06-06 DIAGNOSIS — L97211 Non-pressure chronic ulcer of right calf limited to breakdown of skin: Secondary | ICD-10-CM | POA: Diagnosis not present

## 2016-06-06 DIAGNOSIS — Z7901 Long term (current) use of anticoagulants: Secondary | ICD-10-CM | POA: Diagnosis not present

## 2016-06-06 DIAGNOSIS — I110 Hypertensive heart disease with heart failure: Secondary | ICD-10-CM | POA: Diagnosis not present

## 2016-06-06 DIAGNOSIS — L97221 Non-pressure chronic ulcer of left calf limited to breakdown of skin: Secondary | ICD-10-CM | POA: Diagnosis not present

## 2016-06-06 DIAGNOSIS — I5030 Unspecified diastolic (congestive) heart failure: Secondary | ICD-10-CM | POA: Diagnosis not present

## 2016-06-06 DIAGNOSIS — M109 Gout, unspecified: Secondary | ICD-10-CM | POA: Diagnosis not present

## 2016-06-06 DIAGNOSIS — I4891 Unspecified atrial fibrillation: Secondary | ICD-10-CM | POA: Diagnosis not present

## 2016-06-06 DIAGNOSIS — I890 Lymphedema, not elsewhere classified: Secondary | ICD-10-CM | POA: Diagnosis not present

## 2016-06-06 DIAGNOSIS — I872 Venous insufficiency (chronic) (peripheral): Secondary | ICD-10-CM | POA: Diagnosis not present

## 2016-06-09 DIAGNOSIS — I110 Hypertensive heart disease with heart failure: Secondary | ICD-10-CM | POA: Diagnosis not present

## 2016-06-09 DIAGNOSIS — L97311 Non-pressure chronic ulcer of right ankle limited to breakdown of skin: Secondary | ICD-10-CM | POA: Diagnosis not present

## 2016-06-09 DIAGNOSIS — I890 Lymphedema, not elsewhere classified: Secondary | ICD-10-CM | POA: Diagnosis not present

## 2016-06-09 DIAGNOSIS — I872 Venous insufficiency (chronic) (peripheral): Secondary | ICD-10-CM | POA: Diagnosis not present

## 2016-06-09 DIAGNOSIS — M109 Gout, unspecified: Secondary | ICD-10-CM | POA: Diagnosis not present

## 2016-06-09 DIAGNOSIS — I5030 Unspecified diastolic (congestive) heart failure: Secondary | ICD-10-CM | POA: Diagnosis not present

## 2016-06-09 DIAGNOSIS — Z7901 Long term (current) use of anticoagulants: Secondary | ICD-10-CM | POA: Diagnosis not present

## 2016-06-09 DIAGNOSIS — I4891 Unspecified atrial fibrillation: Secondary | ICD-10-CM | POA: Diagnosis not present

## 2016-06-13 DIAGNOSIS — Z7901 Long term (current) use of anticoagulants: Secondary | ICD-10-CM | POA: Diagnosis not present

## 2016-06-13 DIAGNOSIS — I890 Lymphedema, not elsewhere classified: Secondary | ICD-10-CM | POA: Diagnosis not present

## 2016-06-13 DIAGNOSIS — L97311 Non-pressure chronic ulcer of right ankle limited to breakdown of skin: Secondary | ICD-10-CM | POA: Diagnosis not present

## 2016-06-13 DIAGNOSIS — I872 Venous insufficiency (chronic) (peripheral): Secondary | ICD-10-CM | POA: Diagnosis not present

## 2016-06-13 DIAGNOSIS — M109 Gout, unspecified: Secondary | ICD-10-CM | POA: Diagnosis not present

## 2016-06-13 DIAGNOSIS — I110 Hypertensive heart disease with heart failure: Secondary | ICD-10-CM | POA: Diagnosis not present

## 2016-06-13 DIAGNOSIS — I4891 Unspecified atrial fibrillation: Secondary | ICD-10-CM | POA: Diagnosis not present

## 2016-06-13 DIAGNOSIS — I5030 Unspecified diastolic (congestive) heart failure: Secondary | ICD-10-CM | POA: Diagnosis not present

## 2016-06-16 DIAGNOSIS — I4891 Unspecified atrial fibrillation: Secondary | ICD-10-CM | POA: Diagnosis not present

## 2016-06-16 DIAGNOSIS — L97311 Non-pressure chronic ulcer of right ankle limited to breakdown of skin: Secondary | ICD-10-CM | POA: Diagnosis not present

## 2016-06-16 DIAGNOSIS — I5030 Unspecified diastolic (congestive) heart failure: Secondary | ICD-10-CM | POA: Diagnosis not present

## 2016-06-16 DIAGNOSIS — I110 Hypertensive heart disease with heart failure: Secondary | ICD-10-CM | POA: Diagnosis not present

## 2016-06-16 DIAGNOSIS — I890 Lymphedema, not elsewhere classified: Secondary | ICD-10-CM | POA: Diagnosis not present

## 2016-06-16 DIAGNOSIS — M109 Gout, unspecified: Secondary | ICD-10-CM | POA: Diagnosis not present

## 2016-06-16 DIAGNOSIS — I872 Venous insufficiency (chronic) (peripheral): Secondary | ICD-10-CM | POA: Diagnosis not present

## 2016-06-16 DIAGNOSIS — Z7901 Long term (current) use of anticoagulants: Secondary | ICD-10-CM | POA: Diagnosis not present

## 2016-06-20 DIAGNOSIS — I890 Lymphedema, not elsewhere classified: Secondary | ICD-10-CM | POA: Diagnosis not present

## 2016-06-20 DIAGNOSIS — L97311 Non-pressure chronic ulcer of right ankle limited to breakdown of skin: Secondary | ICD-10-CM | POA: Diagnosis not present

## 2016-06-20 DIAGNOSIS — Z7901 Long term (current) use of anticoagulants: Secondary | ICD-10-CM | POA: Diagnosis not present

## 2016-06-20 DIAGNOSIS — I110 Hypertensive heart disease with heart failure: Secondary | ICD-10-CM | POA: Diagnosis not present

## 2016-06-20 DIAGNOSIS — M109 Gout, unspecified: Secondary | ICD-10-CM | POA: Diagnosis not present

## 2016-06-20 DIAGNOSIS — I872 Venous insufficiency (chronic) (peripheral): Secondary | ICD-10-CM | POA: Diagnosis not present

## 2016-06-20 DIAGNOSIS — I5030 Unspecified diastolic (congestive) heart failure: Secondary | ICD-10-CM | POA: Diagnosis not present

## 2016-06-20 DIAGNOSIS — I4891 Unspecified atrial fibrillation: Secondary | ICD-10-CM | POA: Diagnosis not present

## 2016-06-24 DIAGNOSIS — L97311 Non-pressure chronic ulcer of right ankle limited to breakdown of skin: Secondary | ICD-10-CM | POA: Diagnosis not present

## 2016-06-24 DIAGNOSIS — I5030 Unspecified diastolic (congestive) heart failure: Secondary | ICD-10-CM | POA: Diagnosis not present

## 2016-06-24 DIAGNOSIS — I872 Venous insufficiency (chronic) (peripheral): Secondary | ICD-10-CM | POA: Diagnosis not present

## 2016-06-24 DIAGNOSIS — I890 Lymphedema, not elsewhere classified: Secondary | ICD-10-CM | POA: Diagnosis not present

## 2016-06-24 DIAGNOSIS — M109 Gout, unspecified: Secondary | ICD-10-CM | POA: Diagnosis not present

## 2016-06-24 DIAGNOSIS — Z7901 Long term (current) use of anticoagulants: Secondary | ICD-10-CM | POA: Diagnosis not present

## 2016-06-24 DIAGNOSIS — I110 Hypertensive heart disease with heart failure: Secondary | ICD-10-CM | POA: Diagnosis not present

## 2016-06-24 DIAGNOSIS — I4891 Unspecified atrial fibrillation: Secondary | ICD-10-CM | POA: Diagnosis not present

## 2016-06-27 DIAGNOSIS — I5030 Unspecified diastolic (congestive) heart failure: Secondary | ICD-10-CM | POA: Diagnosis not present

## 2016-06-27 DIAGNOSIS — I890 Lymphedema, not elsewhere classified: Secondary | ICD-10-CM | POA: Diagnosis not present

## 2016-06-27 DIAGNOSIS — I4891 Unspecified atrial fibrillation: Secondary | ICD-10-CM | POA: Diagnosis not present

## 2016-06-27 DIAGNOSIS — M109 Gout, unspecified: Secondary | ICD-10-CM | POA: Diagnosis not present

## 2016-06-27 DIAGNOSIS — L97311 Non-pressure chronic ulcer of right ankle limited to breakdown of skin: Secondary | ICD-10-CM | POA: Diagnosis not present

## 2016-06-27 DIAGNOSIS — Z7901 Long term (current) use of anticoagulants: Secondary | ICD-10-CM | POA: Diagnosis not present

## 2016-06-27 DIAGNOSIS — I872 Venous insufficiency (chronic) (peripheral): Secondary | ICD-10-CM | POA: Diagnosis not present

## 2016-06-27 DIAGNOSIS — I110 Hypertensive heart disease with heart failure: Secondary | ICD-10-CM | POA: Diagnosis not present

## 2016-07-01 DIAGNOSIS — M109 Gout, unspecified: Secondary | ICD-10-CM | POA: Diagnosis not present

## 2016-07-01 DIAGNOSIS — L97311 Non-pressure chronic ulcer of right ankle limited to breakdown of skin: Secondary | ICD-10-CM | POA: Diagnosis not present

## 2016-07-01 DIAGNOSIS — I890 Lymphedema, not elsewhere classified: Secondary | ICD-10-CM | POA: Diagnosis not present

## 2016-07-01 DIAGNOSIS — I110 Hypertensive heart disease with heart failure: Secondary | ICD-10-CM | POA: Diagnosis not present

## 2016-07-01 DIAGNOSIS — I872 Venous insufficiency (chronic) (peripheral): Secondary | ICD-10-CM | POA: Diagnosis not present

## 2016-07-01 DIAGNOSIS — I5030 Unspecified diastolic (congestive) heart failure: Secondary | ICD-10-CM | POA: Diagnosis not present

## 2016-07-01 DIAGNOSIS — I4891 Unspecified atrial fibrillation: Secondary | ICD-10-CM | POA: Diagnosis not present

## 2016-07-01 DIAGNOSIS — Z7901 Long term (current) use of anticoagulants: Secondary | ICD-10-CM | POA: Diagnosis not present

## 2016-07-04 DIAGNOSIS — I110 Hypertensive heart disease with heart failure: Secondary | ICD-10-CM | POA: Diagnosis not present

## 2016-07-04 DIAGNOSIS — I890 Lymphedema, not elsewhere classified: Secondary | ICD-10-CM | POA: Diagnosis not present

## 2016-07-04 DIAGNOSIS — L03116 Cellulitis of left lower limb: Secondary | ICD-10-CM | POA: Diagnosis not present

## 2016-07-04 DIAGNOSIS — L03115 Cellulitis of right lower limb: Secondary | ICD-10-CM | POA: Diagnosis not present

## 2016-07-04 DIAGNOSIS — R600 Localized edema: Secondary | ICD-10-CM | POA: Diagnosis not present

## 2016-07-04 DIAGNOSIS — L97311 Non-pressure chronic ulcer of right ankle limited to breakdown of skin: Secondary | ICD-10-CM | POA: Diagnosis not present

## 2016-07-04 DIAGNOSIS — I872 Venous insufficiency (chronic) (peripheral): Secondary | ICD-10-CM | POA: Diagnosis not present

## 2016-07-04 DIAGNOSIS — I5030 Unspecified diastolic (congestive) heart failure: Secondary | ICD-10-CM | POA: Diagnosis not present

## 2016-07-04 DIAGNOSIS — Z7901 Long term (current) use of anticoagulants: Secondary | ICD-10-CM | POA: Diagnosis not present

## 2016-07-04 DIAGNOSIS — M109 Gout, unspecified: Secondary | ICD-10-CM | POA: Diagnosis not present

## 2016-07-04 DIAGNOSIS — I4891 Unspecified atrial fibrillation: Secondary | ICD-10-CM | POA: Diagnosis not present

## 2016-07-08 DIAGNOSIS — I110 Hypertensive heart disease with heart failure: Secondary | ICD-10-CM | POA: Diagnosis not present

## 2016-07-08 DIAGNOSIS — M109 Gout, unspecified: Secondary | ICD-10-CM | POA: Diagnosis not present

## 2016-07-08 DIAGNOSIS — I872 Venous insufficiency (chronic) (peripheral): Secondary | ICD-10-CM | POA: Diagnosis not present

## 2016-07-08 DIAGNOSIS — I890 Lymphedema, not elsewhere classified: Secondary | ICD-10-CM | POA: Diagnosis not present

## 2016-07-08 DIAGNOSIS — Z7901 Long term (current) use of anticoagulants: Secondary | ICD-10-CM | POA: Diagnosis not present

## 2016-07-08 DIAGNOSIS — I4891 Unspecified atrial fibrillation: Secondary | ICD-10-CM | POA: Diagnosis not present

## 2016-07-08 DIAGNOSIS — L97311 Non-pressure chronic ulcer of right ankle limited to breakdown of skin: Secondary | ICD-10-CM | POA: Diagnosis not present

## 2016-07-08 DIAGNOSIS — I5030 Unspecified diastolic (congestive) heart failure: Secondary | ICD-10-CM | POA: Diagnosis not present

## 2016-07-10 DIAGNOSIS — E784 Other hyperlipidemia: Secondary | ICD-10-CM | POA: Diagnosis not present

## 2016-07-10 DIAGNOSIS — E782 Mixed hyperlipidemia: Secondary | ICD-10-CM | POA: Diagnosis not present

## 2016-07-10 DIAGNOSIS — R799 Abnormal finding of blood chemistry, unspecified: Secondary | ICD-10-CM | POA: Diagnosis not present

## 2016-07-10 DIAGNOSIS — Z125 Encounter for screening for malignant neoplasm of prostate: Secondary | ICD-10-CM | POA: Diagnosis not present

## 2016-07-10 DIAGNOSIS — I482 Chronic atrial fibrillation: Secondary | ICD-10-CM | POA: Diagnosis not present

## 2016-07-10 DIAGNOSIS — I509 Heart failure, unspecified: Secondary | ICD-10-CM | POA: Diagnosis not present

## 2016-07-10 DIAGNOSIS — I4891 Unspecified atrial fibrillation: Secondary | ICD-10-CM | POA: Diagnosis not present

## 2016-07-10 DIAGNOSIS — I10 Essential (primary) hypertension: Secondary | ICD-10-CM | POA: Diagnosis not present

## 2016-07-11 DIAGNOSIS — Z7901 Long term (current) use of anticoagulants: Secondary | ICD-10-CM | POA: Diagnosis not present

## 2016-07-11 DIAGNOSIS — I872 Venous insufficiency (chronic) (peripheral): Secondary | ICD-10-CM | POA: Diagnosis not present

## 2016-07-11 DIAGNOSIS — I890 Lymphedema, not elsewhere classified: Secondary | ICD-10-CM | POA: Diagnosis not present

## 2016-07-11 DIAGNOSIS — I4891 Unspecified atrial fibrillation: Secondary | ICD-10-CM | POA: Diagnosis not present

## 2016-07-11 DIAGNOSIS — I5030 Unspecified diastolic (congestive) heart failure: Secondary | ICD-10-CM | POA: Diagnosis not present

## 2016-07-11 DIAGNOSIS — L97311 Non-pressure chronic ulcer of right ankle limited to breakdown of skin: Secondary | ICD-10-CM | POA: Diagnosis not present

## 2016-07-11 DIAGNOSIS — M109 Gout, unspecified: Secondary | ICD-10-CM | POA: Diagnosis not present

## 2016-07-11 DIAGNOSIS — I110 Hypertensive heart disease with heart failure: Secondary | ICD-10-CM | POA: Diagnosis not present

## 2016-07-15 DIAGNOSIS — L97311 Non-pressure chronic ulcer of right ankle limited to breakdown of skin: Secondary | ICD-10-CM | POA: Diagnosis not present

## 2016-07-15 DIAGNOSIS — I4891 Unspecified atrial fibrillation: Secondary | ICD-10-CM | POA: Diagnosis not present

## 2016-07-15 DIAGNOSIS — I5030 Unspecified diastolic (congestive) heart failure: Secondary | ICD-10-CM | POA: Diagnosis not present

## 2016-07-15 DIAGNOSIS — I872 Venous insufficiency (chronic) (peripheral): Secondary | ICD-10-CM | POA: Diagnosis not present

## 2016-07-15 DIAGNOSIS — Z7901 Long term (current) use of anticoagulants: Secondary | ICD-10-CM | POA: Diagnosis not present

## 2016-07-15 DIAGNOSIS — I890 Lymphedema, not elsewhere classified: Secondary | ICD-10-CM | POA: Diagnosis not present

## 2016-07-15 DIAGNOSIS — I110 Hypertensive heart disease with heart failure: Secondary | ICD-10-CM | POA: Diagnosis not present

## 2016-07-15 DIAGNOSIS — M109 Gout, unspecified: Secondary | ICD-10-CM | POA: Diagnosis not present

## 2016-07-17 DIAGNOSIS — I890 Lymphedema, not elsewhere classified: Secondary | ICD-10-CM | POA: Diagnosis not present

## 2016-07-17 DIAGNOSIS — Z7901 Long term (current) use of anticoagulants: Secondary | ICD-10-CM | POA: Diagnosis not present

## 2016-07-17 DIAGNOSIS — L97311 Non-pressure chronic ulcer of right ankle limited to breakdown of skin: Secondary | ICD-10-CM | POA: Diagnosis not present

## 2016-07-17 DIAGNOSIS — M109 Gout, unspecified: Secondary | ICD-10-CM | POA: Diagnosis not present

## 2016-07-17 DIAGNOSIS — I872 Venous insufficiency (chronic) (peripheral): Secondary | ICD-10-CM | POA: Diagnosis not present

## 2016-07-17 DIAGNOSIS — I110 Hypertensive heart disease with heart failure: Secondary | ICD-10-CM | POA: Diagnosis not present

## 2016-07-17 DIAGNOSIS — I4891 Unspecified atrial fibrillation: Secondary | ICD-10-CM | POA: Diagnosis not present

## 2016-07-17 DIAGNOSIS — I5030 Unspecified diastolic (congestive) heart failure: Secondary | ICD-10-CM | POA: Diagnosis not present

## 2016-07-21 DIAGNOSIS — I110 Hypertensive heart disease with heart failure: Secondary | ICD-10-CM | POA: Diagnosis not present

## 2016-07-21 DIAGNOSIS — I4891 Unspecified atrial fibrillation: Secondary | ICD-10-CM | POA: Diagnosis not present

## 2016-07-21 DIAGNOSIS — I872 Venous insufficiency (chronic) (peripheral): Secondary | ICD-10-CM | POA: Diagnosis not present

## 2016-07-21 DIAGNOSIS — Z7901 Long term (current) use of anticoagulants: Secondary | ICD-10-CM | POA: Diagnosis not present

## 2016-07-21 DIAGNOSIS — I890 Lymphedema, not elsewhere classified: Secondary | ICD-10-CM | POA: Diagnosis not present

## 2016-07-21 DIAGNOSIS — L97311 Non-pressure chronic ulcer of right ankle limited to breakdown of skin: Secondary | ICD-10-CM | POA: Diagnosis not present

## 2016-07-21 DIAGNOSIS — M109 Gout, unspecified: Secondary | ICD-10-CM | POA: Diagnosis not present

## 2016-07-21 DIAGNOSIS — I5030 Unspecified diastolic (congestive) heart failure: Secondary | ICD-10-CM | POA: Diagnosis not present

## 2016-07-24 DIAGNOSIS — I4891 Unspecified atrial fibrillation: Secondary | ICD-10-CM | POA: Diagnosis not present

## 2016-07-24 DIAGNOSIS — I110 Hypertensive heart disease with heart failure: Secondary | ICD-10-CM | POA: Diagnosis not present

## 2016-07-24 DIAGNOSIS — Z7901 Long term (current) use of anticoagulants: Secondary | ICD-10-CM | POA: Diagnosis not present

## 2016-07-24 DIAGNOSIS — L97311 Non-pressure chronic ulcer of right ankle limited to breakdown of skin: Secondary | ICD-10-CM | POA: Diagnosis not present

## 2016-07-24 DIAGNOSIS — I872 Venous insufficiency (chronic) (peripheral): Secondary | ICD-10-CM | POA: Diagnosis not present

## 2016-07-24 DIAGNOSIS — M109 Gout, unspecified: Secondary | ICD-10-CM | POA: Diagnosis not present

## 2016-07-24 DIAGNOSIS — I5030 Unspecified diastolic (congestive) heart failure: Secondary | ICD-10-CM | POA: Diagnosis not present

## 2016-07-24 DIAGNOSIS — I890 Lymphedema, not elsewhere classified: Secondary | ICD-10-CM | POA: Diagnosis not present

## 2016-07-28 DIAGNOSIS — M109 Gout, unspecified: Secondary | ICD-10-CM | POA: Diagnosis not present

## 2016-07-28 DIAGNOSIS — L97311 Non-pressure chronic ulcer of right ankle limited to breakdown of skin: Secondary | ICD-10-CM | POA: Diagnosis not present

## 2016-07-28 DIAGNOSIS — I872 Venous insufficiency (chronic) (peripheral): Secondary | ICD-10-CM | POA: Diagnosis not present

## 2016-07-28 DIAGNOSIS — Z7901 Long term (current) use of anticoagulants: Secondary | ICD-10-CM | POA: Diagnosis not present

## 2016-07-28 DIAGNOSIS — I5030 Unspecified diastolic (congestive) heart failure: Secondary | ICD-10-CM | POA: Diagnosis not present

## 2016-07-28 DIAGNOSIS — I890 Lymphedema, not elsewhere classified: Secondary | ICD-10-CM | POA: Diagnosis not present

## 2016-07-28 DIAGNOSIS — I4891 Unspecified atrial fibrillation: Secondary | ICD-10-CM | POA: Diagnosis not present

## 2016-07-28 DIAGNOSIS — I110 Hypertensive heart disease with heart failure: Secondary | ICD-10-CM | POA: Diagnosis not present

## 2016-07-31 DIAGNOSIS — Z7901 Long term (current) use of anticoagulants: Secondary | ICD-10-CM | POA: Diagnosis not present

## 2016-07-31 DIAGNOSIS — I872 Venous insufficiency (chronic) (peripheral): Secondary | ICD-10-CM | POA: Diagnosis not present

## 2016-07-31 DIAGNOSIS — L97311 Non-pressure chronic ulcer of right ankle limited to breakdown of skin: Secondary | ICD-10-CM | POA: Diagnosis not present

## 2016-07-31 DIAGNOSIS — I110 Hypertensive heart disease with heart failure: Secondary | ICD-10-CM | POA: Diagnosis not present

## 2016-07-31 DIAGNOSIS — I5030 Unspecified diastolic (congestive) heart failure: Secondary | ICD-10-CM | POA: Diagnosis not present

## 2016-07-31 DIAGNOSIS — M109 Gout, unspecified: Secondary | ICD-10-CM | POA: Diagnosis not present

## 2016-07-31 DIAGNOSIS — I890 Lymphedema, not elsewhere classified: Secondary | ICD-10-CM | POA: Diagnosis not present

## 2016-07-31 DIAGNOSIS — I4891 Unspecified atrial fibrillation: Secondary | ICD-10-CM | POA: Diagnosis not present

## 2016-08-04 DIAGNOSIS — I739 Peripheral vascular disease, unspecified: Secondary | ICD-10-CM | POA: Diagnosis not present

## 2016-08-04 DIAGNOSIS — I480 Paroxysmal atrial fibrillation: Secondary | ICD-10-CM | POA: Diagnosis not present

## 2016-08-04 DIAGNOSIS — G3184 Mild cognitive impairment, so stated: Secondary | ICD-10-CM | POA: Diagnosis not present

## 2016-08-04 DIAGNOSIS — I5022 Chronic systolic (congestive) heart failure: Secondary | ICD-10-CM | POA: Diagnosis not present

## 2016-08-05 DIAGNOSIS — I110 Hypertensive heart disease with heart failure: Secondary | ICD-10-CM | POA: Diagnosis not present

## 2016-08-05 DIAGNOSIS — I890 Lymphedema, not elsewhere classified: Secondary | ICD-10-CM | POA: Diagnosis not present

## 2016-08-05 DIAGNOSIS — M109 Gout, unspecified: Secondary | ICD-10-CM | POA: Diagnosis not present

## 2016-08-05 DIAGNOSIS — I5030 Unspecified diastolic (congestive) heart failure: Secondary | ICD-10-CM | POA: Diagnosis not present

## 2016-08-05 DIAGNOSIS — Z7901 Long term (current) use of anticoagulants: Secondary | ICD-10-CM | POA: Diagnosis not present

## 2016-08-05 DIAGNOSIS — L97311 Non-pressure chronic ulcer of right ankle limited to breakdown of skin: Secondary | ICD-10-CM | POA: Diagnosis not present

## 2016-08-05 DIAGNOSIS — I4891 Unspecified atrial fibrillation: Secondary | ICD-10-CM | POA: Diagnosis not present

## 2016-08-05 DIAGNOSIS — I872 Venous insufficiency (chronic) (peripheral): Secondary | ICD-10-CM | POA: Diagnosis not present

## 2016-08-08 DIAGNOSIS — I872 Venous insufficiency (chronic) (peripheral): Secondary | ICD-10-CM | POA: Diagnosis not present

## 2016-08-08 DIAGNOSIS — I5030 Unspecified diastolic (congestive) heart failure: Secondary | ICD-10-CM | POA: Diagnosis not present

## 2016-08-08 DIAGNOSIS — I890 Lymphedema, not elsewhere classified: Secondary | ICD-10-CM | POA: Diagnosis not present

## 2016-08-08 DIAGNOSIS — M109 Gout, unspecified: Secondary | ICD-10-CM | POA: Diagnosis not present

## 2016-08-08 DIAGNOSIS — I110 Hypertensive heart disease with heart failure: Secondary | ICD-10-CM | POA: Diagnosis not present

## 2016-08-08 DIAGNOSIS — I4891 Unspecified atrial fibrillation: Secondary | ICD-10-CM | POA: Diagnosis not present

## 2016-08-08 DIAGNOSIS — Z7901 Long term (current) use of anticoagulants: Secondary | ICD-10-CM | POA: Diagnosis not present

## 2016-08-08 DIAGNOSIS — L97311 Non-pressure chronic ulcer of right ankle limited to breakdown of skin: Secondary | ICD-10-CM | POA: Diagnosis not present

## 2016-08-12 DIAGNOSIS — M109 Gout, unspecified: Secondary | ICD-10-CM | POA: Diagnosis not present

## 2016-08-12 DIAGNOSIS — I110 Hypertensive heart disease with heart failure: Secondary | ICD-10-CM | POA: Diagnosis not present

## 2016-08-12 DIAGNOSIS — Z7901 Long term (current) use of anticoagulants: Secondary | ICD-10-CM | POA: Diagnosis not present

## 2016-08-12 DIAGNOSIS — I872 Venous insufficiency (chronic) (peripheral): Secondary | ICD-10-CM | POA: Diagnosis not present

## 2016-08-12 DIAGNOSIS — I5030 Unspecified diastolic (congestive) heart failure: Secondary | ICD-10-CM | POA: Diagnosis not present

## 2016-08-12 DIAGNOSIS — I890 Lymphedema, not elsewhere classified: Secondary | ICD-10-CM | POA: Diagnosis not present

## 2016-08-12 DIAGNOSIS — L97311 Non-pressure chronic ulcer of right ankle limited to breakdown of skin: Secondary | ICD-10-CM | POA: Diagnosis not present

## 2016-08-12 DIAGNOSIS — I4891 Unspecified atrial fibrillation: Secondary | ICD-10-CM | POA: Diagnosis not present

## 2016-08-13 DIAGNOSIS — I5022 Chronic systolic (congestive) heart failure: Secondary | ICD-10-CM | POA: Diagnosis not present

## 2016-08-13 DIAGNOSIS — I480 Paroxysmal atrial fibrillation: Secondary | ICD-10-CM | POA: Diagnosis not present

## 2016-08-13 DIAGNOSIS — I739 Peripheral vascular disease, unspecified: Secondary | ICD-10-CM | POA: Diagnosis not present

## 2016-08-15 DIAGNOSIS — Z7901 Long term (current) use of anticoagulants: Secondary | ICD-10-CM | POA: Diagnosis not present

## 2016-08-15 DIAGNOSIS — M109 Gout, unspecified: Secondary | ICD-10-CM | POA: Diagnosis not present

## 2016-08-15 DIAGNOSIS — I110 Hypertensive heart disease with heart failure: Secondary | ICD-10-CM | POA: Diagnosis not present

## 2016-08-15 DIAGNOSIS — L97311 Non-pressure chronic ulcer of right ankle limited to breakdown of skin: Secondary | ICD-10-CM | POA: Diagnosis not present

## 2016-08-15 DIAGNOSIS — I4891 Unspecified atrial fibrillation: Secondary | ICD-10-CM | POA: Diagnosis not present

## 2016-08-15 DIAGNOSIS — I5030 Unspecified diastolic (congestive) heart failure: Secondary | ICD-10-CM | POA: Diagnosis not present

## 2016-08-15 DIAGNOSIS — I890 Lymphedema, not elsewhere classified: Secondary | ICD-10-CM | POA: Diagnosis not present

## 2016-08-15 DIAGNOSIS — I872 Venous insufficiency (chronic) (peripheral): Secondary | ICD-10-CM | POA: Diagnosis not present

## 2016-08-19 DIAGNOSIS — Z7901 Long term (current) use of anticoagulants: Secondary | ICD-10-CM | POA: Diagnosis not present

## 2016-08-19 DIAGNOSIS — M109 Gout, unspecified: Secondary | ICD-10-CM | POA: Diagnosis not present

## 2016-08-19 DIAGNOSIS — I110 Hypertensive heart disease with heart failure: Secondary | ICD-10-CM | POA: Diagnosis not present

## 2016-08-19 DIAGNOSIS — I5030 Unspecified diastolic (congestive) heart failure: Secondary | ICD-10-CM | POA: Diagnosis not present

## 2016-08-19 DIAGNOSIS — I4891 Unspecified atrial fibrillation: Secondary | ICD-10-CM | POA: Diagnosis not present

## 2016-08-19 DIAGNOSIS — L97311 Non-pressure chronic ulcer of right ankle limited to breakdown of skin: Secondary | ICD-10-CM | POA: Diagnosis not present

## 2016-08-19 DIAGNOSIS — I872 Venous insufficiency (chronic) (peripheral): Secondary | ICD-10-CM | POA: Diagnosis not present

## 2016-08-19 DIAGNOSIS — I890 Lymphedema, not elsewhere classified: Secondary | ICD-10-CM | POA: Diagnosis not present

## 2016-08-22 DIAGNOSIS — L97311 Non-pressure chronic ulcer of right ankle limited to breakdown of skin: Secondary | ICD-10-CM | POA: Diagnosis not present

## 2016-08-22 DIAGNOSIS — Z7901 Long term (current) use of anticoagulants: Secondary | ICD-10-CM | POA: Diagnosis not present

## 2016-08-22 DIAGNOSIS — I872 Venous insufficiency (chronic) (peripheral): Secondary | ICD-10-CM | POA: Diagnosis not present

## 2016-08-22 DIAGNOSIS — I4891 Unspecified atrial fibrillation: Secondary | ICD-10-CM | POA: Diagnosis not present

## 2016-08-22 DIAGNOSIS — I890 Lymphedema, not elsewhere classified: Secondary | ICD-10-CM | POA: Diagnosis not present

## 2016-08-22 DIAGNOSIS — M109 Gout, unspecified: Secondary | ICD-10-CM | POA: Diagnosis not present

## 2016-08-22 DIAGNOSIS — I110 Hypertensive heart disease with heart failure: Secondary | ICD-10-CM | POA: Diagnosis not present

## 2016-08-22 DIAGNOSIS — I5030 Unspecified diastolic (congestive) heart failure: Secondary | ICD-10-CM | POA: Diagnosis not present

## 2016-08-26 DIAGNOSIS — I872 Venous insufficiency (chronic) (peripheral): Secondary | ICD-10-CM | POA: Diagnosis not present

## 2016-08-26 DIAGNOSIS — I4891 Unspecified atrial fibrillation: Secondary | ICD-10-CM | POA: Diagnosis not present

## 2016-08-26 DIAGNOSIS — I110 Hypertensive heart disease with heart failure: Secondary | ICD-10-CM | POA: Diagnosis not present

## 2016-08-26 DIAGNOSIS — L97311 Non-pressure chronic ulcer of right ankle limited to breakdown of skin: Secondary | ICD-10-CM | POA: Diagnosis not present

## 2016-08-26 DIAGNOSIS — M109 Gout, unspecified: Secondary | ICD-10-CM | POA: Diagnosis not present

## 2016-08-26 DIAGNOSIS — I5030 Unspecified diastolic (congestive) heart failure: Secondary | ICD-10-CM | POA: Diagnosis not present

## 2016-08-26 DIAGNOSIS — I890 Lymphedema, not elsewhere classified: Secondary | ICD-10-CM | POA: Diagnosis not present

## 2016-08-26 DIAGNOSIS — Z7901 Long term (current) use of anticoagulants: Secondary | ICD-10-CM | POA: Diagnosis not present

## 2016-08-29 DIAGNOSIS — I890 Lymphedema, not elsewhere classified: Secondary | ICD-10-CM | POA: Diagnosis not present

## 2016-08-29 DIAGNOSIS — M109 Gout, unspecified: Secondary | ICD-10-CM | POA: Diagnosis not present

## 2016-08-29 DIAGNOSIS — L97311 Non-pressure chronic ulcer of right ankle limited to breakdown of skin: Secondary | ICD-10-CM | POA: Diagnosis not present

## 2016-08-29 DIAGNOSIS — I4891 Unspecified atrial fibrillation: Secondary | ICD-10-CM | POA: Diagnosis not present

## 2016-08-29 DIAGNOSIS — I110 Hypertensive heart disease with heart failure: Secondary | ICD-10-CM | POA: Diagnosis not present

## 2016-08-29 DIAGNOSIS — I5030 Unspecified diastolic (congestive) heart failure: Secondary | ICD-10-CM | POA: Diagnosis not present

## 2016-08-29 DIAGNOSIS — I872 Venous insufficiency (chronic) (peripheral): Secondary | ICD-10-CM | POA: Diagnosis not present

## 2016-08-29 DIAGNOSIS — Z7901 Long term (current) use of anticoagulants: Secondary | ICD-10-CM | POA: Diagnosis not present

## 2016-09-02 DIAGNOSIS — I872 Venous insufficiency (chronic) (peripheral): Secondary | ICD-10-CM | POA: Diagnosis not present

## 2016-09-02 DIAGNOSIS — M109 Gout, unspecified: Secondary | ICD-10-CM | POA: Diagnosis not present

## 2016-09-02 DIAGNOSIS — I5030 Unspecified diastolic (congestive) heart failure: Secondary | ICD-10-CM | POA: Diagnosis not present

## 2016-09-02 DIAGNOSIS — I110 Hypertensive heart disease with heart failure: Secondary | ICD-10-CM | POA: Diagnosis not present

## 2016-09-02 DIAGNOSIS — I4891 Unspecified atrial fibrillation: Secondary | ICD-10-CM | POA: Diagnosis not present

## 2016-09-02 DIAGNOSIS — I890 Lymphedema, not elsewhere classified: Secondary | ICD-10-CM | POA: Diagnosis not present

## 2016-09-02 DIAGNOSIS — Z7901 Long term (current) use of anticoagulants: Secondary | ICD-10-CM | POA: Diagnosis not present

## 2016-09-02 DIAGNOSIS — L97311 Non-pressure chronic ulcer of right ankle limited to breakdown of skin: Secondary | ICD-10-CM | POA: Diagnosis not present

## 2016-09-03 DIAGNOSIS — I872 Venous insufficiency (chronic) (peripheral): Secondary | ICD-10-CM | POA: Diagnosis not present

## 2016-09-03 DIAGNOSIS — I110 Hypertensive heart disease with heart failure: Secondary | ICD-10-CM | POA: Diagnosis not present

## 2016-09-03 DIAGNOSIS — Z7901 Long term (current) use of anticoagulants: Secondary | ICD-10-CM | POA: Diagnosis not present

## 2016-09-03 DIAGNOSIS — I4891 Unspecified atrial fibrillation: Secondary | ICD-10-CM | POA: Diagnosis not present

## 2016-09-03 DIAGNOSIS — L97311 Non-pressure chronic ulcer of right ankle limited to breakdown of skin: Secondary | ICD-10-CM | POA: Diagnosis not present

## 2016-09-03 DIAGNOSIS — I890 Lymphedema, not elsewhere classified: Secondary | ICD-10-CM | POA: Diagnosis not present

## 2016-09-03 DIAGNOSIS — M109 Gout, unspecified: Secondary | ICD-10-CM | POA: Diagnosis not present

## 2016-09-03 DIAGNOSIS — I5030 Unspecified diastolic (congestive) heart failure: Secondary | ICD-10-CM | POA: Diagnosis not present

## 2016-09-04 DIAGNOSIS — Z7901 Long term (current) use of anticoagulants: Secondary | ICD-10-CM | POA: Diagnosis not present

## 2016-09-04 DIAGNOSIS — I5030 Unspecified diastolic (congestive) heart failure: Secondary | ICD-10-CM | POA: Diagnosis not present

## 2016-09-04 DIAGNOSIS — I4891 Unspecified atrial fibrillation: Secondary | ICD-10-CM | POA: Diagnosis not present

## 2016-09-04 DIAGNOSIS — M109 Gout, unspecified: Secondary | ICD-10-CM | POA: Diagnosis not present

## 2016-09-04 DIAGNOSIS — I110 Hypertensive heart disease with heart failure: Secondary | ICD-10-CM | POA: Diagnosis not present

## 2016-09-04 DIAGNOSIS — I872 Venous insufficiency (chronic) (peripheral): Secondary | ICD-10-CM | POA: Diagnosis not present

## 2016-09-04 DIAGNOSIS — I890 Lymphedema, not elsewhere classified: Secondary | ICD-10-CM | POA: Diagnosis not present

## 2016-09-04 DIAGNOSIS — L97311 Non-pressure chronic ulcer of right ankle limited to breakdown of skin: Secondary | ICD-10-CM | POA: Diagnosis not present

## 2016-09-09 DIAGNOSIS — M109 Gout, unspecified: Secondary | ICD-10-CM | POA: Diagnosis not present

## 2016-09-09 DIAGNOSIS — Z7901 Long term (current) use of anticoagulants: Secondary | ICD-10-CM | POA: Diagnosis not present

## 2016-09-09 DIAGNOSIS — I4891 Unspecified atrial fibrillation: Secondary | ICD-10-CM | POA: Diagnosis not present

## 2016-09-09 DIAGNOSIS — I872 Venous insufficiency (chronic) (peripheral): Secondary | ICD-10-CM | POA: Diagnosis not present

## 2016-09-09 DIAGNOSIS — I110 Hypertensive heart disease with heart failure: Secondary | ICD-10-CM | POA: Diagnosis not present

## 2016-09-09 DIAGNOSIS — L97311 Non-pressure chronic ulcer of right ankle limited to breakdown of skin: Secondary | ICD-10-CM | POA: Diagnosis not present

## 2016-09-09 DIAGNOSIS — I890 Lymphedema, not elsewhere classified: Secondary | ICD-10-CM | POA: Diagnosis not present

## 2016-09-09 DIAGNOSIS — I5030 Unspecified diastolic (congestive) heart failure: Secondary | ICD-10-CM | POA: Diagnosis not present

## 2016-09-12 DIAGNOSIS — I4891 Unspecified atrial fibrillation: Secondary | ICD-10-CM | POA: Diagnosis not present

## 2016-09-12 DIAGNOSIS — M109 Gout, unspecified: Secondary | ICD-10-CM | POA: Diagnosis not present

## 2016-09-12 DIAGNOSIS — I5030 Unspecified diastolic (congestive) heart failure: Secondary | ICD-10-CM | POA: Diagnosis not present

## 2016-09-12 DIAGNOSIS — I110 Hypertensive heart disease with heart failure: Secondary | ICD-10-CM | POA: Diagnosis not present

## 2016-09-12 DIAGNOSIS — I872 Venous insufficiency (chronic) (peripheral): Secondary | ICD-10-CM | POA: Diagnosis not present

## 2016-09-12 DIAGNOSIS — Z7901 Long term (current) use of anticoagulants: Secondary | ICD-10-CM | POA: Diagnosis not present

## 2016-09-12 DIAGNOSIS — I890 Lymphedema, not elsewhere classified: Secondary | ICD-10-CM | POA: Diagnosis not present

## 2016-09-12 DIAGNOSIS — L97311 Non-pressure chronic ulcer of right ankle limited to breakdown of skin: Secondary | ICD-10-CM | POA: Diagnosis not present

## 2016-09-16 DIAGNOSIS — I5030 Unspecified diastolic (congestive) heart failure: Secondary | ICD-10-CM | POA: Diagnosis not present

## 2016-09-16 DIAGNOSIS — Z7901 Long term (current) use of anticoagulants: Secondary | ICD-10-CM | POA: Diagnosis not present

## 2016-09-16 DIAGNOSIS — L97311 Non-pressure chronic ulcer of right ankle limited to breakdown of skin: Secondary | ICD-10-CM | POA: Diagnosis not present

## 2016-09-16 DIAGNOSIS — I890 Lymphedema, not elsewhere classified: Secondary | ICD-10-CM | POA: Diagnosis not present

## 2016-09-16 DIAGNOSIS — M109 Gout, unspecified: Secondary | ICD-10-CM | POA: Diagnosis not present

## 2016-09-16 DIAGNOSIS — I872 Venous insufficiency (chronic) (peripheral): Secondary | ICD-10-CM | POA: Diagnosis not present

## 2016-09-16 DIAGNOSIS — I110 Hypertensive heart disease with heart failure: Secondary | ICD-10-CM | POA: Diagnosis not present

## 2016-09-16 DIAGNOSIS — I4891 Unspecified atrial fibrillation: Secondary | ICD-10-CM | POA: Diagnosis not present

## 2016-09-19 DIAGNOSIS — Z7901 Long term (current) use of anticoagulants: Secondary | ICD-10-CM | POA: Diagnosis not present

## 2016-09-19 DIAGNOSIS — M109 Gout, unspecified: Secondary | ICD-10-CM | POA: Diagnosis not present

## 2016-09-19 DIAGNOSIS — I5030 Unspecified diastolic (congestive) heart failure: Secondary | ICD-10-CM | POA: Diagnosis not present

## 2016-09-19 DIAGNOSIS — L97311 Non-pressure chronic ulcer of right ankle limited to breakdown of skin: Secondary | ICD-10-CM | POA: Diagnosis not present

## 2016-09-19 DIAGNOSIS — I4891 Unspecified atrial fibrillation: Secondary | ICD-10-CM | POA: Diagnosis not present

## 2016-09-19 DIAGNOSIS — I110 Hypertensive heart disease with heart failure: Secondary | ICD-10-CM | POA: Diagnosis not present

## 2016-09-19 DIAGNOSIS — I872 Venous insufficiency (chronic) (peripheral): Secondary | ICD-10-CM | POA: Diagnosis not present

## 2016-09-19 DIAGNOSIS — I890 Lymphedema, not elsewhere classified: Secondary | ICD-10-CM | POA: Diagnosis not present

## 2016-09-23 DIAGNOSIS — I4891 Unspecified atrial fibrillation: Secondary | ICD-10-CM | POA: Diagnosis not present

## 2016-09-23 DIAGNOSIS — I872 Venous insufficiency (chronic) (peripheral): Secondary | ICD-10-CM | POA: Diagnosis not present

## 2016-09-23 DIAGNOSIS — I890 Lymphedema, not elsewhere classified: Secondary | ICD-10-CM | POA: Diagnosis not present

## 2016-09-23 DIAGNOSIS — I110 Hypertensive heart disease with heart failure: Secondary | ICD-10-CM | POA: Diagnosis not present

## 2016-09-23 DIAGNOSIS — Z7901 Long term (current) use of anticoagulants: Secondary | ICD-10-CM | POA: Diagnosis not present

## 2016-09-23 DIAGNOSIS — I5030 Unspecified diastolic (congestive) heart failure: Secondary | ICD-10-CM | POA: Diagnosis not present

## 2016-09-23 DIAGNOSIS — L97311 Non-pressure chronic ulcer of right ankle limited to breakdown of skin: Secondary | ICD-10-CM | POA: Diagnosis not present

## 2016-09-23 DIAGNOSIS — M109 Gout, unspecified: Secondary | ICD-10-CM | POA: Diagnosis not present

## 2016-09-26 DIAGNOSIS — I110 Hypertensive heart disease with heart failure: Secondary | ICD-10-CM | POA: Diagnosis not present

## 2016-09-26 DIAGNOSIS — I890 Lymphedema, not elsewhere classified: Secondary | ICD-10-CM | POA: Diagnosis not present

## 2016-09-26 DIAGNOSIS — M109 Gout, unspecified: Secondary | ICD-10-CM | POA: Diagnosis not present

## 2016-09-26 DIAGNOSIS — I872 Venous insufficiency (chronic) (peripheral): Secondary | ICD-10-CM | POA: Diagnosis not present

## 2016-09-26 DIAGNOSIS — L97311 Non-pressure chronic ulcer of right ankle limited to breakdown of skin: Secondary | ICD-10-CM | POA: Diagnosis not present

## 2016-09-26 DIAGNOSIS — I5030 Unspecified diastolic (congestive) heart failure: Secondary | ICD-10-CM | POA: Diagnosis not present

## 2016-09-26 DIAGNOSIS — I4891 Unspecified atrial fibrillation: Secondary | ICD-10-CM | POA: Diagnosis not present

## 2016-09-26 DIAGNOSIS — Z7901 Long term (current) use of anticoagulants: Secondary | ICD-10-CM | POA: Diagnosis not present

## 2016-09-30 DIAGNOSIS — I872 Venous insufficiency (chronic) (peripheral): Secondary | ICD-10-CM | POA: Diagnosis not present

## 2016-09-30 DIAGNOSIS — Z7901 Long term (current) use of anticoagulants: Secondary | ICD-10-CM | POA: Diagnosis not present

## 2016-09-30 DIAGNOSIS — M109 Gout, unspecified: Secondary | ICD-10-CM | POA: Diagnosis not present

## 2016-09-30 DIAGNOSIS — I5030 Unspecified diastolic (congestive) heart failure: Secondary | ICD-10-CM | POA: Diagnosis not present

## 2016-09-30 DIAGNOSIS — I110 Hypertensive heart disease with heart failure: Secondary | ICD-10-CM | POA: Diagnosis not present

## 2016-09-30 DIAGNOSIS — I4891 Unspecified atrial fibrillation: Secondary | ICD-10-CM | POA: Diagnosis not present

## 2016-09-30 DIAGNOSIS — L97311 Non-pressure chronic ulcer of right ankle limited to breakdown of skin: Secondary | ICD-10-CM | POA: Diagnosis not present

## 2016-09-30 DIAGNOSIS — I890 Lymphedema, not elsewhere classified: Secondary | ICD-10-CM | POA: Diagnosis not present

## 2016-10-03 DIAGNOSIS — L97311 Non-pressure chronic ulcer of right ankle limited to breakdown of skin: Secondary | ICD-10-CM | POA: Diagnosis not present

## 2016-10-03 DIAGNOSIS — I872 Venous insufficiency (chronic) (peripheral): Secondary | ICD-10-CM | POA: Diagnosis not present

## 2016-10-03 DIAGNOSIS — M109 Gout, unspecified: Secondary | ICD-10-CM | POA: Diagnosis not present

## 2016-10-03 DIAGNOSIS — I110 Hypertensive heart disease with heart failure: Secondary | ICD-10-CM | POA: Diagnosis not present

## 2016-10-03 DIAGNOSIS — I890 Lymphedema, not elsewhere classified: Secondary | ICD-10-CM | POA: Diagnosis not present

## 2016-10-03 DIAGNOSIS — I4891 Unspecified atrial fibrillation: Secondary | ICD-10-CM | POA: Diagnosis not present

## 2016-10-03 DIAGNOSIS — I5030 Unspecified diastolic (congestive) heart failure: Secondary | ICD-10-CM | POA: Diagnosis not present

## 2016-10-03 DIAGNOSIS — Z7901 Long term (current) use of anticoagulants: Secondary | ICD-10-CM | POA: Diagnosis not present

## 2016-10-07 DIAGNOSIS — I5030 Unspecified diastolic (congestive) heart failure: Secondary | ICD-10-CM | POA: Diagnosis not present

## 2016-10-07 DIAGNOSIS — I4891 Unspecified atrial fibrillation: Secondary | ICD-10-CM | POA: Diagnosis not present

## 2016-10-07 DIAGNOSIS — M109 Gout, unspecified: Secondary | ICD-10-CM | POA: Diagnosis not present

## 2016-10-07 DIAGNOSIS — I872 Venous insufficiency (chronic) (peripheral): Secondary | ICD-10-CM | POA: Diagnosis not present

## 2016-10-07 DIAGNOSIS — I110 Hypertensive heart disease with heart failure: Secondary | ICD-10-CM | POA: Diagnosis not present

## 2016-10-07 DIAGNOSIS — L97311 Non-pressure chronic ulcer of right ankle limited to breakdown of skin: Secondary | ICD-10-CM | POA: Diagnosis not present

## 2016-10-07 DIAGNOSIS — Z7901 Long term (current) use of anticoagulants: Secondary | ICD-10-CM | POA: Diagnosis not present

## 2016-10-07 DIAGNOSIS — I890 Lymphedema, not elsewhere classified: Secondary | ICD-10-CM | POA: Diagnosis not present

## 2016-10-10 DIAGNOSIS — I110 Hypertensive heart disease with heart failure: Secondary | ICD-10-CM | POA: Diagnosis not present

## 2016-10-10 DIAGNOSIS — I890 Lymphedema, not elsewhere classified: Secondary | ICD-10-CM | POA: Diagnosis not present

## 2016-10-10 DIAGNOSIS — I872 Venous insufficiency (chronic) (peripheral): Secondary | ICD-10-CM | POA: Diagnosis not present

## 2016-10-10 DIAGNOSIS — I4891 Unspecified atrial fibrillation: Secondary | ICD-10-CM | POA: Diagnosis not present

## 2016-10-10 DIAGNOSIS — Z7901 Long term (current) use of anticoagulants: Secondary | ICD-10-CM | POA: Diagnosis not present

## 2016-10-10 DIAGNOSIS — I5030 Unspecified diastolic (congestive) heart failure: Secondary | ICD-10-CM | POA: Diagnosis not present

## 2016-10-10 DIAGNOSIS — M109 Gout, unspecified: Secondary | ICD-10-CM | POA: Diagnosis not present

## 2016-10-10 DIAGNOSIS — L97311 Non-pressure chronic ulcer of right ankle limited to breakdown of skin: Secondary | ICD-10-CM | POA: Diagnosis not present

## 2016-10-14 DIAGNOSIS — M109 Gout, unspecified: Secondary | ICD-10-CM | POA: Diagnosis not present

## 2016-10-14 DIAGNOSIS — I110 Hypertensive heart disease with heart failure: Secondary | ICD-10-CM | POA: Diagnosis not present

## 2016-10-14 DIAGNOSIS — I890 Lymphedema, not elsewhere classified: Secondary | ICD-10-CM | POA: Diagnosis not present

## 2016-10-14 DIAGNOSIS — I4891 Unspecified atrial fibrillation: Secondary | ICD-10-CM | POA: Diagnosis not present

## 2016-10-14 DIAGNOSIS — I5030 Unspecified diastolic (congestive) heart failure: Secondary | ICD-10-CM | POA: Diagnosis not present

## 2016-10-14 DIAGNOSIS — Z7901 Long term (current) use of anticoagulants: Secondary | ICD-10-CM | POA: Diagnosis not present

## 2016-10-14 DIAGNOSIS — L97311 Non-pressure chronic ulcer of right ankle limited to breakdown of skin: Secondary | ICD-10-CM | POA: Diagnosis not present

## 2016-10-14 DIAGNOSIS — I872 Venous insufficiency (chronic) (peripheral): Secondary | ICD-10-CM | POA: Diagnosis not present

## 2016-10-17 DIAGNOSIS — L97311 Non-pressure chronic ulcer of right ankle limited to breakdown of skin: Secondary | ICD-10-CM | POA: Diagnosis not present

## 2016-10-17 DIAGNOSIS — I872 Venous insufficiency (chronic) (peripheral): Secondary | ICD-10-CM | POA: Diagnosis not present

## 2016-10-17 DIAGNOSIS — I5030 Unspecified diastolic (congestive) heart failure: Secondary | ICD-10-CM | POA: Diagnosis not present

## 2016-10-17 DIAGNOSIS — I110 Hypertensive heart disease with heart failure: Secondary | ICD-10-CM | POA: Diagnosis not present

## 2016-10-17 DIAGNOSIS — I4891 Unspecified atrial fibrillation: Secondary | ICD-10-CM | POA: Diagnosis not present

## 2016-10-17 DIAGNOSIS — M109 Gout, unspecified: Secondary | ICD-10-CM | POA: Diagnosis not present

## 2016-10-17 DIAGNOSIS — I890 Lymphedema, not elsewhere classified: Secondary | ICD-10-CM | POA: Diagnosis not present

## 2016-10-17 DIAGNOSIS — Z7901 Long term (current) use of anticoagulants: Secondary | ICD-10-CM | POA: Diagnosis not present

## 2016-10-21 DIAGNOSIS — I890 Lymphedema, not elsewhere classified: Secondary | ICD-10-CM | POA: Diagnosis not present

## 2016-10-21 DIAGNOSIS — M109 Gout, unspecified: Secondary | ICD-10-CM | POA: Diagnosis not present

## 2016-10-21 DIAGNOSIS — I4891 Unspecified atrial fibrillation: Secondary | ICD-10-CM | POA: Diagnosis not present

## 2016-10-21 DIAGNOSIS — I5030 Unspecified diastolic (congestive) heart failure: Secondary | ICD-10-CM | POA: Diagnosis not present

## 2016-10-21 DIAGNOSIS — L97311 Non-pressure chronic ulcer of right ankle limited to breakdown of skin: Secondary | ICD-10-CM | POA: Diagnosis not present

## 2016-10-21 DIAGNOSIS — Z7901 Long term (current) use of anticoagulants: Secondary | ICD-10-CM | POA: Diagnosis not present

## 2016-10-21 DIAGNOSIS — I110 Hypertensive heart disease with heart failure: Secondary | ICD-10-CM | POA: Diagnosis not present

## 2016-10-21 DIAGNOSIS — I872 Venous insufficiency (chronic) (peripheral): Secondary | ICD-10-CM | POA: Diagnosis not present

## 2016-10-24 DIAGNOSIS — I110 Hypertensive heart disease with heart failure: Secondary | ICD-10-CM | POA: Diagnosis not present

## 2016-10-24 DIAGNOSIS — I872 Venous insufficiency (chronic) (peripheral): Secondary | ICD-10-CM | POA: Diagnosis not present

## 2016-10-24 DIAGNOSIS — Z7901 Long term (current) use of anticoagulants: Secondary | ICD-10-CM | POA: Diagnosis not present

## 2016-10-24 DIAGNOSIS — I4891 Unspecified atrial fibrillation: Secondary | ICD-10-CM | POA: Diagnosis not present

## 2016-10-24 DIAGNOSIS — M109 Gout, unspecified: Secondary | ICD-10-CM | POA: Diagnosis not present

## 2016-10-24 DIAGNOSIS — I890 Lymphedema, not elsewhere classified: Secondary | ICD-10-CM | POA: Diagnosis not present

## 2016-10-24 DIAGNOSIS — I5030 Unspecified diastolic (congestive) heart failure: Secondary | ICD-10-CM | POA: Diagnosis not present

## 2016-10-24 DIAGNOSIS — L97311 Non-pressure chronic ulcer of right ankle limited to breakdown of skin: Secondary | ICD-10-CM | POA: Diagnosis not present

## 2016-10-28 DIAGNOSIS — M109 Gout, unspecified: Secondary | ICD-10-CM | POA: Diagnosis not present

## 2016-10-28 DIAGNOSIS — I5030 Unspecified diastolic (congestive) heart failure: Secondary | ICD-10-CM | POA: Diagnosis not present

## 2016-10-28 DIAGNOSIS — I110 Hypertensive heart disease with heart failure: Secondary | ICD-10-CM | POA: Diagnosis not present

## 2016-10-28 DIAGNOSIS — Z7901 Long term (current) use of anticoagulants: Secondary | ICD-10-CM | POA: Diagnosis not present

## 2016-10-28 DIAGNOSIS — L97311 Non-pressure chronic ulcer of right ankle limited to breakdown of skin: Secondary | ICD-10-CM | POA: Diagnosis not present

## 2016-10-28 DIAGNOSIS — I872 Venous insufficiency (chronic) (peripheral): Secondary | ICD-10-CM | POA: Diagnosis not present

## 2016-10-28 DIAGNOSIS — I890 Lymphedema, not elsewhere classified: Secondary | ICD-10-CM | POA: Diagnosis not present

## 2016-10-28 DIAGNOSIS — I4891 Unspecified atrial fibrillation: Secondary | ICD-10-CM | POA: Diagnosis not present

## 2016-10-31 DIAGNOSIS — I110 Hypertensive heart disease with heart failure: Secondary | ICD-10-CM | POA: Diagnosis not present

## 2016-10-31 DIAGNOSIS — I5030 Unspecified diastolic (congestive) heart failure: Secondary | ICD-10-CM | POA: Diagnosis not present

## 2016-10-31 DIAGNOSIS — M109 Gout, unspecified: Secondary | ICD-10-CM | POA: Diagnosis not present

## 2016-10-31 DIAGNOSIS — I890 Lymphedema, not elsewhere classified: Secondary | ICD-10-CM | POA: Diagnosis not present

## 2016-10-31 DIAGNOSIS — I4891 Unspecified atrial fibrillation: Secondary | ICD-10-CM | POA: Diagnosis not present

## 2016-10-31 DIAGNOSIS — L97311 Non-pressure chronic ulcer of right ankle limited to breakdown of skin: Secondary | ICD-10-CM | POA: Diagnosis not present

## 2016-10-31 DIAGNOSIS — Z7901 Long term (current) use of anticoagulants: Secondary | ICD-10-CM | POA: Diagnosis not present

## 2016-10-31 DIAGNOSIS — I872 Venous insufficiency (chronic) (peripheral): Secondary | ICD-10-CM | POA: Diagnosis not present

## 2016-11-04 DIAGNOSIS — I872 Venous insufficiency (chronic) (peripheral): Secondary | ICD-10-CM | POA: Diagnosis not present

## 2016-11-04 DIAGNOSIS — I890 Lymphedema, not elsewhere classified: Secondary | ICD-10-CM | POA: Diagnosis not present

## 2016-11-04 DIAGNOSIS — Z7901 Long term (current) use of anticoagulants: Secondary | ICD-10-CM | POA: Diagnosis not present

## 2016-11-04 DIAGNOSIS — L97311 Non-pressure chronic ulcer of right ankle limited to breakdown of skin: Secondary | ICD-10-CM | POA: Diagnosis not present

## 2016-11-04 DIAGNOSIS — I4891 Unspecified atrial fibrillation: Secondary | ICD-10-CM | POA: Diagnosis not present

## 2016-11-04 DIAGNOSIS — I5030 Unspecified diastolic (congestive) heart failure: Secondary | ICD-10-CM | POA: Diagnosis not present

## 2016-11-04 DIAGNOSIS — M109 Gout, unspecified: Secondary | ICD-10-CM | POA: Diagnosis not present

## 2016-11-04 DIAGNOSIS — I110 Hypertensive heart disease with heart failure: Secondary | ICD-10-CM | POA: Diagnosis not present

## 2016-11-07 DIAGNOSIS — I4891 Unspecified atrial fibrillation: Secondary | ICD-10-CM | POA: Diagnosis not present

## 2016-11-07 DIAGNOSIS — L97311 Non-pressure chronic ulcer of right ankle limited to breakdown of skin: Secondary | ICD-10-CM | POA: Diagnosis not present

## 2016-11-07 DIAGNOSIS — I872 Venous insufficiency (chronic) (peripheral): Secondary | ICD-10-CM | POA: Diagnosis not present

## 2016-11-07 DIAGNOSIS — I5030 Unspecified diastolic (congestive) heart failure: Secondary | ICD-10-CM | POA: Diagnosis not present

## 2016-11-07 DIAGNOSIS — L02419 Cutaneous abscess of limb, unspecified: Secondary | ICD-10-CM

## 2016-11-07 DIAGNOSIS — I110 Hypertensive heart disease with heart failure: Secondary | ICD-10-CM | POA: Diagnosis not present

## 2016-11-07 DIAGNOSIS — I890 Lymphedema, not elsewhere classified: Secondary | ICD-10-CM | POA: Diagnosis not present

## 2016-11-07 DIAGNOSIS — M109 Gout, unspecified: Secondary | ICD-10-CM | POA: Diagnosis not present

## 2016-11-07 DIAGNOSIS — Z7901 Long term (current) use of anticoagulants: Secondary | ICD-10-CM | POA: Diagnosis not present

## 2016-11-07 DIAGNOSIS — L03119 Cellulitis of unspecified part of limb: Secondary | ICD-10-CM

## 2016-11-07 HISTORY — DX: Cutaneous abscess of limb, unspecified: L02.419

## 2016-11-07 HISTORY — DX: Cellulitis of unspecified part of limb: L03.119

## 2016-11-11 DIAGNOSIS — I872 Venous insufficiency (chronic) (peripheral): Secondary | ICD-10-CM | POA: Diagnosis not present

## 2016-11-11 DIAGNOSIS — I5030 Unspecified diastolic (congestive) heart failure: Secondary | ICD-10-CM | POA: Diagnosis not present

## 2016-11-11 DIAGNOSIS — I4891 Unspecified atrial fibrillation: Secondary | ICD-10-CM | POA: Diagnosis not present

## 2016-11-11 DIAGNOSIS — L97311 Non-pressure chronic ulcer of right ankle limited to breakdown of skin: Secondary | ICD-10-CM | POA: Diagnosis not present

## 2016-11-11 DIAGNOSIS — I110 Hypertensive heart disease with heart failure: Secondary | ICD-10-CM | POA: Diagnosis not present

## 2016-11-11 DIAGNOSIS — I890 Lymphedema, not elsewhere classified: Secondary | ICD-10-CM | POA: Diagnosis not present

## 2016-11-11 DIAGNOSIS — Z7901 Long term (current) use of anticoagulants: Secondary | ICD-10-CM | POA: Diagnosis not present

## 2016-11-11 DIAGNOSIS — M109 Gout, unspecified: Secondary | ICD-10-CM | POA: Diagnosis not present

## 2016-11-13 ENCOUNTER — Inpatient Hospital Stay (HOSPITAL_COMMUNITY)
Admission: EM | Admit: 2016-11-13 | Discharge: 2016-11-17 | DRG: 292 | Disposition: A | Discharge status: Home / Self Care | Payer: Commercial Managed Care - HMO | Attending: Family Medicine | Admitting: Family Medicine

## 2016-11-13 ENCOUNTER — Emergency Department (HOSPITAL_COMMUNITY): Payer: Commercial Managed Care - HMO

## 2016-11-13 ENCOUNTER — Encounter (HOSPITAL_COMMUNITY): Payer: Self-pay | Admitting: General Practice

## 2016-11-13 DIAGNOSIS — I83009 Varicose veins of unspecified lower extremity with ulcer of unspecified site: Secondary | ICD-10-CM | POA: Diagnosis present

## 2016-11-13 DIAGNOSIS — I5031 Acute diastolic (congestive) heart failure: Secondary | ICD-10-CM

## 2016-11-13 DIAGNOSIS — Z87891 Personal history of nicotine dependence: Secondary | ICD-10-CM

## 2016-11-13 DIAGNOSIS — M109 Gout, unspecified: Secondary | ICD-10-CM | POA: Diagnosis present

## 2016-11-13 DIAGNOSIS — E7800 Pure hypercholesterolemia, unspecified: Secondary | ICD-10-CM | POA: Diagnosis present

## 2016-11-13 DIAGNOSIS — R32 Unspecified urinary incontinence: Secondary | ICD-10-CM | POA: Diagnosis present

## 2016-11-13 DIAGNOSIS — R05 Cough: Secondary | ICD-10-CM | POA: Diagnosis not present

## 2016-11-13 DIAGNOSIS — D638 Anemia in other chronic diseases classified elsewhere: Secondary | ICD-10-CM | POA: Diagnosis not present

## 2016-11-13 DIAGNOSIS — I110 Hypertensive heart disease with heart failure: Principal | ICD-10-CM | POA: Diagnosis present

## 2016-11-13 DIAGNOSIS — L97909 Non-pressure chronic ulcer of unspecified part of unspecified lower leg with unspecified severity: Secondary | ICD-10-CM | POA: Diagnosis present

## 2016-11-13 DIAGNOSIS — Z7901 Long term (current) use of anticoagulants: Secondary | ICD-10-CM

## 2016-11-13 DIAGNOSIS — R609 Edema, unspecified: Secondary | ICD-10-CM

## 2016-11-13 DIAGNOSIS — R4182 Altered mental status, unspecified: Secondary | ICD-10-CM | POA: Diagnosis not present

## 2016-11-13 DIAGNOSIS — Z6841 Body Mass Index (BMI) 40.0 and over, adult: Secondary | ICD-10-CM | POA: Diagnosis not present

## 2016-11-13 DIAGNOSIS — I509 Heart failure, unspecified: Secondary | ICD-10-CM

## 2016-11-13 DIAGNOSIS — I5033 Acute on chronic diastolic (congestive) heart failure: Secondary | ICD-10-CM | POA: Diagnosis present

## 2016-11-13 DIAGNOSIS — L03115 Cellulitis of right lower limb: Secondary | ICD-10-CM | POA: Diagnosis not present

## 2016-11-13 DIAGNOSIS — I10 Essential (primary) hypertension: Secondary | ICD-10-CM | POA: Diagnosis not present

## 2016-11-13 DIAGNOSIS — I482 Chronic atrial fibrillation: Secondary | ICD-10-CM | POA: Diagnosis not present

## 2016-11-13 DIAGNOSIS — G4733 Obstructive sleep apnea (adult) (pediatric): Secondary | ICD-10-CM | POA: Diagnosis present

## 2016-11-13 DIAGNOSIS — I4891 Unspecified atrial fibrillation: Secondary | ICD-10-CM | POA: Diagnosis present

## 2016-11-13 DIAGNOSIS — I480 Paroxysmal atrial fibrillation: Secondary | ICD-10-CM | POA: Diagnosis not present

## 2016-11-13 DIAGNOSIS — E871 Hypo-osmolality and hyponatremia: Secondary | ICD-10-CM | POA: Diagnosis present

## 2016-11-13 DIAGNOSIS — R509 Fever, unspecified: Secondary | ICD-10-CM | POA: Diagnosis not present

## 2016-11-13 DIAGNOSIS — Z79899 Other long term (current) drug therapy: Secondary | ICD-10-CM

## 2016-11-13 HISTORY — DX: Chronic atrial fibrillation, unspecified: I48.20

## 2016-11-13 HISTORY — DX: Unspecified osteoarthritis, unspecified site: M19.90

## 2016-11-13 HISTORY — DX: Pure hypercholesterolemia, unspecified: E78.00

## 2016-11-13 LAB — BASIC METABOLIC PANEL
ANION GAP: 8 (ref 5–15)
BUN: 11 mg/dL (ref 6–20)
CALCIUM: 8.9 mg/dL (ref 8.9–10.3)
CO2: 30 mmol/L (ref 22–32)
Chloride: 88 mmol/L — ABNORMAL LOW (ref 101–111)
Creatinine, Ser: 1.02 mg/dL (ref 0.61–1.24)
Glucose, Bld: 103 mg/dL — ABNORMAL HIGH (ref 65–99)
Potassium: 3.9 mmol/L (ref 3.5–5.1)
Sodium: 126 mmol/L — ABNORMAL LOW (ref 135–145)

## 2016-11-13 LAB — CBC
HCT: 37.4 % — ABNORMAL LOW (ref 39.0–52.0)
Hemoglobin: 12.4 g/dL — ABNORMAL LOW (ref 13.0–17.0)
MCH: 28.4 pg (ref 26.0–34.0)
MCHC: 33.2 g/dL (ref 30.0–36.0)
MCV: 85.8 fL (ref 78.0–100.0)
PLATELETS: 263 10*3/uL (ref 150–400)
RBC: 4.36 MIL/uL (ref 4.22–5.81)
RDW: 13.6 % (ref 11.5–15.5)
WBC: 6.9 10*3/uL (ref 4.0–10.5)

## 2016-11-13 LAB — BRAIN NATRIURETIC PEPTIDE: B NATRIURETIC PEPTIDE 5: 142.6 pg/mL — AB (ref 0.0–100.0)

## 2016-11-13 MED ORDER — PANTOPRAZOLE SODIUM 40 MG PO TBEC
40.0000 mg | DELAYED_RELEASE_TABLET | Freq: Every day | ORAL | Status: DC
  Administered 2016-11-14 – 2016-11-17 (×4): 40 mg via ORAL
  Filled 2016-11-13 (×4): qty 1

## 2016-11-13 MED ORDER — LISINOPRIL 2.5 MG PO TABS
2.5000 mg | ORAL_TABLET | Freq: Every day | ORAL | Status: DC
  Administered 2016-11-14 – 2016-11-16 (×3): 2.5 mg via ORAL
  Filled 2016-11-13 (×3): qty 1

## 2016-11-13 MED ORDER — ACETAMINOPHEN 650 MG RE SUPP: 650.0000 mg | Freq: Four times a day (QID) | RECTAL | Status: DC | PRN

## 2016-11-13 MED ORDER — POTASSIUM CHLORIDE CRYS ER 20 MEQ PO TBCR
40.0000 meq | EXTENDED_RELEASE_TABLET | Freq: Two times a day (BID) | ORAL | Status: DC
  Administered 2016-11-14 – 2016-11-17 (×8): 40 meq via ORAL
  Filled 2016-11-13 (×8): qty 2

## 2016-11-13 MED ORDER — SPIRONOLACTONE 25 MG PO TABS
25.0000 mg | ORAL_TABLET | Freq: Every day | ORAL | Status: DC
  Administered 2016-11-14 – 2016-11-17 (×4): 25 mg via ORAL
  Filled 2016-11-13 (×4): qty 1

## 2016-11-13 MED ORDER — ATORVASTATIN CALCIUM 80 MG PO TABS
80.0000 mg | ORAL_TABLET | Freq: Every day | ORAL | Status: DC
  Administered 2016-11-14 – 2016-11-17 (×4): 80 mg via ORAL
  Filled 2016-11-13 (×4): qty 1

## 2016-11-13 MED ORDER — APIXABAN 5 MG PO TABS
5.0000 mg | ORAL_TABLET | Freq: Two times a day (BID) | ORAL | Status: DC
  Administered 2016-11-14 – 2016-11-17 (×8): 5 mg via ORAL
  Filled 2016-11-13 (×8): qty 1

## 2016-11-13 MED ORDER — ACETAMINOPHEN 325 MG PO TABS: 650.0000 mg | ORAL_TABLET | Freq: Four times a day (QID) | ORAL | Status: DC | PRN

## 2016-11-13 MED ORDER — CARVEDILOL 6.25 MG PO TABS
6.2500 mg | ORAL_TABLET | Freq: Two times a day (BID) | ORAL | Status: DC
  Administered 2016-11-14 – 2016-11-17 (×6): 6.25 mg via ORAL
  Filled 2016-11-13 (×7): qty 1

## 2016-11-13 MED ORDER — ONDANSETRON HCL 4 MG PO TABS: 4.0000 mg | ORAL_TABLET | Freq: Four times a day (QID) | ORAL | Status: DC | PRN

## 2016-11-13 MED ORDER — ONDANSETRON HCL 4 MG/2ML IJ SOLN: 4.0000 mg | Freq: Four times a day (QID) | INTRAMUSCULAR | Status: DC | PRN

## 2016-11-13 MED ORDER — FUROSEMIDE 10 MG/ML IJ SOLN
80.0000 mg | Freq: Two times a day (BID) | INTRAMUSCULAR | Status: DC
  Administered 2016-11-13: 80 mg via INTRAVENOUS
  Filled 2016-11-13: qty 8

## 2016-11-13 MED ORDER — FUROSEMIDE 10 MG/ML IJ SOLN
80.0000 mg | Freq: Two times a day (BID) | INTRAMUSCULAR | Status: DC
  Filled 2016-11-13: qty 8

## 2016-11-13 NOTE — ED Triage Notes (Addendum)
Per [facility]: Pt is complaining of fluid retention in his legs. EMS is calling DSS due to there being standing puddles of urine and feces in the house. The pt was insistent about bringing a duffle bag with him, duffle bag reportedly has "larvae" in it. Pts feet are in poor condition, his feet are wrapped up and is weeping through the wraps.  Pt does have a wet unproductive cough, pt has had this "for years"

## 2016-11-13 NOTE — ED Notes (Signed)
Dr. Nanavati at bedside 

## 2016-11-13 NOTE — ED Notes (Signed)
Urine output noted in urinary catheter bag.

## 2016-11-13 NOTE — ED Notes (Signed)
Patient transported to X-ray 

## 2016-11-13 NOTE — Consult Note (Signed)
Patient ID: Jesus [name] MRN: [id number], DOB/AGE: 1946-04-13   Admit date: 11/13/2016   Reason for Consult: LE Edema  Requesting MD: Dr. Rhunette Croft    Primary Physician: Jesus [name] Primary Cardiologist: Dr. Shirlee Latch (last seen in 2014)  Pt. Profile:  70 y/o male with history of chronic atrial fibrillation, resistant HTN, and diastolic CHF presenting to ED with complaint of bilateral LEE.   Problem List  Past Medical History:  Diagnosis Date  . Atrial fibrillation    a. chronic   . Chronic diastolic CHF (congestive heart failure) 12/2002   a. EF 50-55% by echo 04/2012  . Edema of lower extremity   . Gout   . H/O: GI bleed    "cause I took too much aspirin"  . Hyperglycemia    Noted 05/2012  . Hypertension   . Morbid obesity   . Venous stasis ulcer     Past Surgical History:  Procedure Laterality Date  . NO PAST SURGERIES       Allergies  Allergies  Allergen Reactions  . Amlodipine Besylate Swelling    "started off low and ended up severe; if, in fact, that is what's causing the swelling" (06/03/12)    HPI  70 y/o male with history of chronic atrial fibrillation, resistant HTN, and diastolic CHF. Was being followed by Dr. Shirlee Latch but was lost to f/u. Last OV was in 2014. Per records, Jesus [name] has a long history of lower extremity edema.  He was told as early as 2003 that he has CHF.  He was seen by Dr. Patsy Lager in 8/12 at Urgent Care and was noted to be in atrial fibrillation.  He has been in atrial fibrillation chronically since that time.  He is not very mobile because of obesity and severe bilateral knee pain from arthritis.  He developed progressive lower extremity edema and scrotal edema to the point that he was not able to walk due to discomfort.  Dr. Shirlee Latch admitted him in 6/13 and he was diuresed > 40 lbs with significantly improved edema.  According to Dr. Alford Highland last office note, the patient self discontinued anticoagulation. He was on Coumadin but  stopped and refused to continue. He also declined NOAC at that time. Based on current medication list, it appears that he has been ordered to take Eliquis, however, when asked, he is not sure if he is taking it. Unsure if he is on a blood thinner. He reports his daughter arranges all of his pills in a pill box. His most recent 2D echo was 01/06/2015.  He presents to the ED today via EMS with a complaint of worsening bilateral LEE. He notes this happens to him every year. He develops progressive LE that "requires hospital admission once a year for IV treatment". Pt notes, "the swelling goes away, and I come back in a year for repeat treatment". He denies leg pain. He has chronic dyspnea at baseline, but denies any worsening dyspnea. No chest pain. Very poor hygiene. Per ED physician, he has a wound care RN that supposedly comes to his home periodically to assist with his chronic edema.    EF was 55-60%. Today in ED, BNP is 142.6. WBC is 6.9. He is afebrile CXR pending.     Home Medications  Prior to Admission medications   Medication Sig Start Date End Date Taking? Authorizing Provider  apixaban (ELIQUIS) 5 MG TABS tablet Take 1 tablet (5 mg total) by mouth 2 (two) times daily. 07/18/15  Shanker Levora DredgeM Ghimire, MD  atorvastatin (LIPITOR) 80 MG tablet Take 1 tablet (80 mg total) by mouth daily. 07/18/15   Shanker Levora DredgeM Ghimire, MD  carvedilol (COREG) 6.25 MG tablet Take 1 tablet (6.25 mg total) by mouth 2 (two) times daily with a meal. 07/18/15   Shanker Levora DredgeM Ghimire, MD  doxycycline (VIBRA-TABS) 100 MG tablet Take 1 tablet (100 mg total) by mouth every 12 (twelve) hours. Patient not taking: Reported on 07/31/2015 07/18/15   Maretta BeesShanker M Ghimire, MD  lisinopril (PRINIVIL,ZESTRIL) 10 MG tablet Take 1 tablet (10 mg total) by mouth daily. Patient not taking: Reported on 07/31/2015 07/18/15   Maretta BeesShanker M Ghimire, MD  pantoprazole (PROTONIX) 40 MG tablet Take 1 tablet (40 mg total) by mouth daily. 07/18/15   Shanker Levora DredgeM Ghimire,  MD  potassium chloride SA (K-DUR,KLOR-CON) 20 MEQ tablet Take 2 tablets (40 mEq total) by mouth 2 (two) times daily. 07/18/15   Shanker Levora DredgeM Ghimire, MD  saccharomyces boulardii (FLORASTOR) 250 MG capsule Take 1 capsule (250 mg total) by mouth 2 (two) times daily. Patient not taking: Reported on 07/31/2015 07/18/15   Maretta BeesShanker M Ghimire, MD  spironolactone (ALDACTONE) 25 MG tablet Take 1 tablet (25 mg total) by mouth daily. Start 8/11 07/19/15   Shanker Levora DredgeM Ghimire, MD  torsemide (DEMADEX) 20 MG tablet Take 3 tablets (60 mg total) by mouth 2 (two) times daily. Start 8/11 07/18/15   Shanker Levora DredgeM Ghimire, MD    Family History  Family History  Problem Relation Age of Onset  . Arrhythmia Father   . Hypertension Father   . Cancer Mother     cervical    Social History  Social History   Social History  . Marital status: Divorced    Spouse name: N/A  . Number of children: N/A  . Years of education: N/A   Occupational History  . Retired    Social History Main Topics  . Smoking status: Former Smoker    Types: Cigarettes    Quit date: 12/08/1965  . Smokeless tobacco: Never Used     Comment: "social cigarette smoker in my late teens"  . Alcohol use 8.4 oz/week    14 Cans of beer per week  . Drug use: No  . Sexual activity: Not Currently   Other Topics Concern  . Not on file   Social History Narrative   Patient lives alone.     Review of Systems General:  No chills, fever, night sweats or weight changes.  Cardiovascular:  No chest pain, dyspnea on exertion, edema, orthopnea, palpitations, paroxysmal nocturnal dyspnea. Dermatological: No rash, lesions/masses Respiratory: No cough, dyspnea Urologic: No hematuria, dysuria Abdominal:   No nausea, vomiting, diarrhea, bright red blood per rectum, melena, or hematemesis Neurologic:  No visual changes, wkns, changes in mental status. All other systems reviewed and are otherwise negative except as noted above.  Physical Exam  Blood pressure  159/81, pulse 66, temperature 97.7 F (36.5 C), temperature source Oral, resp. rate 23, SpO2 96 %.  General: Pleasant, NAD, obese, foul odor  Psych: Normal affect. Neuro: Alert and oriented X 3. Moves all extremities spontaneously. HEENT: Normal  Neck: Supple without bruits or JVD. Lungs:  Resp regular and unlabored, CTA. Heart: irreguarlly no s3, s4, or murmurs. Abdomen: Soft, non-tender, non-distended, BS + x 4.  Extremities: both lower extremities are wrapped in ace bandages, appears to have drainage of serous fluid/crusting on legs and bilateral. DP/PT/Radials 2+ and equal bilaterally.  Labs  Troponin [facility](Point of Care Test)  No results for input(s): TROPIPOC in the last 72 hours. No results for input(s): CKTOTAL, CKMB, TROPONINI in the last 72 hours. Lab Results  Component Value Date   WBC 6.9 11/13/2016   HGB 12.4 (L) 11/13/2016   HCT 37.4 (L) 11/13/2016   MCV 85.8 11/13/2016   PLT 263 11/13/2016    Recent Labs Lab 11/13/16 1435  NA 126*  K 3.9  CL 88*  CO2 30  BUN 11  CREATININE 1.02  CALCIUM 8.9  GLUCOSE 103*   Lab Results  Component Value Date   CHOL 156 04/02/2015   HDL 29 (L) 04/02/2015   LDLCALC 98 04/02/2015   TRIG 145 04/02/2015   No results found for: DDIMER   Radiology/Studies  No results found.  ECG  Not performed, patient not on telemetry   ASSESSMENT AND PLAN  1. Chronic Bilateral LEE:  Worsening LEE over the course of a year with weeping edema. He states he is not responding to home diuretics. Very poor hygiene with bilateral LE wounds. Recommend admission to medicine. IV diuretics. Wound care management.  Cardiology will follow.    Signed, Robbie LisBrittainy Simmons, [name] 11/13/2016, 4:23 PM

## 2016-11-13 NOTE — ED Notes (Signed)
Attempted to call report x 1 to [place], RN on [address]. Reported some type of problem with the bed. Left name & number for return call to give report.

## 2016-11-13 NOTE — ED Provider Notes (Signed)
.   Please see previous physicians note regarding patient's presenting history and physical, initial ED course, and associated medical decision making.  Please see previous physicians note regarding patient's presenting history and physical. In short this is a 70 year old male with history of diastolic heart failure who presents with increasing lower extremity edema. His pending cardiology consult regarding medication management.  Cardiology recommending inpatient admission for diuresis. He did receive 80 mg of Lasix here in the ED. We will consult medicine service for admission.   Lavera Guiseana Duo Jaielle Dlouhy, MD 11/13/16 [phone]

## 2016-11-13 NOTE — Progress Notes (Signed)
Patient arrived in the unit accompanied by NT via stretcher. Orientation to the unit given. Patient verbalizes understanding. 

## 2016-11-13 NOTE — H&P (Signed)
History and Physical    Jesus [name] RUE:[id number] DOB: 1946/06/24 DOA: 11/13/2016  PCP: [name],[name], [name]  Patient coming from: Home.  Chief Complaint: Increasing lower extremity edema.  HPI: Jesus [name] is a 70 y.o. male with CHF, hypertension, chronic atrial fibrillation, chronic lower extremity wound, hyperlipidemia presents to the ER because of worsening lower extremity edema. Patient states he has been coming to the hospital every year for worsening edema but had come late this year. Denies any fever chills chest pain or shortness of breath. Patient states he usually sleeps on a recliner and does not sleep flat. In the ER chest x-ray shows congestion concerning for CHF and patient has significant lower extremity edema bilaterally. On-call cardiology was consulted. Patient is being admitted for IV diuresis.   Patient also has chronic lower extremity wounds bilaterally.  ED Course: Chest x-ray was showing congestion.   Review of Systems: As per HPI, rest all negative.   Past Medical History:  Diagnosis Date  . Arthritis    "normal for my age" (11/13/2016)  . Chronic atrial fibrillation (HCC)   . Chronic diastolic CHF (congestive heart failure) (HCC) 12/2002   a. EF 50-55% by echo 04/2012  . Edema of lower extremity   . Gout   . H/O: GI bleed    "cause I took too much aspirin"  . High cholesterol   . Hyperglycemia    Noted 05/2012  . Hypertension   . Morbid obesity (HCC)   . Venous stasis ulcer (HCC)     Past Surgical History:  Procedure Laterality Date  . NO PAST SURGERIES       reports that he quit smoking about 50 years ago. His smoking use included Cigarettes. He has never used smokeless tobacco. He reports that he drinks about 8.4 oz of alcohol per week . He reports that he does not use drugs.  Allergies  Allergen Reactions  . Amlodipine Besylate Swelling    "started off low and ended up severe; if, in fact, that is what's causing the swelling"  (06/03/12)    Family History  Problem Relation Age of Onset  . Arrhythmia Father   . Hypertension Father   . Cancer Mother     cervical    Prior to Admission medications   Medication Sig Start Date End Date Taking? Authorizing Provider  apixaban (ELIQUIS) 5 MG TABS tablet Take 1 tablet (5 mg total) by mouth 2 (two) times daily. 07/18/15   Shanker Levora DredgeM Ghimire, MD  atorvastatin (LIPITOR) 80 MG tablet Take 1 tablet (80 mg total) by mouth daily. 07/18/15   Shanker Levora DredgeM Ghimire, MD  carvedilol (COREG) 6.25 MG tablet Take 1 tablet (6.25 mg total) by mouth 2 (two) times daily with a meal. 07/18/15   Shanker Levora DredgeM Ghimire, MD  doxycycline (VIBRA-TABS) 100 MG tablet Take 1 tablet (100 mg total) by mouth every 12 (twelve) hours. Patient not taking: Reported on 07/31/2015 07/18/15   Maretta BeesShanker M Ghimire, MD  lisinopril (PRINIVIL,ZESTRIL) 10 MG tablet Take 1 tablet (10 mg total) by mouth daily. Patient not taking: Reported on 07/31/2015 07/18/15   Maretta BeesShanker M Ghimire, MD  pantoprazole (PROTONIX) 40 MG tablet Take 1 tablet (40 mg total) by mouth daily. 07/18/15   Shanker Levora DredgeM Ghimire, MD  potassium chloride SA (K-DUR,KLOR-CON) 20 MEQ tablet Take 2 tablets (40 mEq total) by mouth 2 (two) times daily. 07/18/15   Shanker Levora DredgeM Ghimire, MD  saccharomyces boulardii (FLORASTOR) 250 MG capsule Take 1 capsule (250 mg total)  by mouth 2 (two) times daily. Patient not taking: Reported on 07/31/2015 07/18/15   Maretta BeesShanker M Ghimire, MD  spironolactone (ALDACTONE) 25 MG tablet Take 1 tablet (25 mg total) by mouth daily. Start 8/11 07/19/15   Shanker Levora DredgeM Ghimire, MD  torsemide (DEMADEX) 20 MG tablet Take 3 tablets (60 mg total) by mouth 2 (two) times daily. Start 8/11 07/18/15   Shanker Levora DredgeM Ghimire, MD    Physical Exam:Obese not in distress.  Vitals:   11/13/16 1830 11/13/16 1900 11/13/16 2100 11/13/16 2203  BP: 130/86 154/77 (!) 144/53 (!) 166/68  Pulse: 72  65 64  Resp: 17 12 15  (!) 22  Temp:    97.6 F (36.4 C)  TempSrc:    Oral  SpO2: 92%  95%  99%  Weight:    (!) 140.6 kg (309 lb 14.4 oz)  Height:    5\' 10"  (1.778 m)      Constituti 11/13/16 1830 11/13/16 1900 11/13/16 2100 11/13/16 2203  BP: 130/86 154/77 (!) 144/53 (!) 166/68  Pulse: 72  65 64  Resp: 17 12 15  (!) 22  Temp:    97.6 F (36.4 C)  TempSrc:    Oral  SpO2: 92%  95% 99%  Weight:    (!) 140.6 kg (309 lb 14.4 oz)  Height:    5\' 10"  (1.778 m)   Eyes: Anicteric no pallor. ENMT: No discharge from the ears eyes nose or mouth. Neck: No mass felt. No JVD appreciated. Respiratory: No rhonchi or crepitations. Cardiovascular: S1-S2 heard. Abdomen: Soft nontender bowel sounds present. Musculoskeletal: Bilateral lower extremity edema extending up to the thigh with chronic wounds of both lower extremity. Skin: Chronic wounds of the both lower extremity. Neurologic: Alert awake oriented to time place and person. Moves all extremities. Psychiatric: Appears normal. Normal affect.   Labs on Admission: I have personally reviewed following labs and imaging studies  CBC:  Recent Labs Lab 11/13/16 1435  WBC 6.9  HGB 12.4*  HCT 37.4*  MCV 85.8  PLT 263   Basic Metabolic Panel:  Recent Labs Lab 11/13/16 1435  NA 126*  K 3.9  CL 88*  CO2 30  GLUCOSE 103*  BUN 11  CREATININE 1.02  CALCIUM 8.9   GFR: Estimated Creatinine Clearance: 95.3 mL/min (by C-G formula based on SCr of 1.02 mg/dL). Liver Function Tests: No results for input(s): AST, ALT, ALKPHOS, BILITOT, PROT, ALBUMIN in the last 168 hours. No results for input(s): LIPASE, AMYLASE in the last 168 hours. No results for input(s): AMMONIA in the last 168 hours. Coagulation Profile: No results for input(s): INR, PROTIME in the last 168 hours. Cardiac Enzymes: No results for input(s): CKTOTAL, CKMB, CKMBINDEX, TROPONINI in the last 168 hours. BNP (last 3 results) No results for input(s): PROBNP in the last 8760 hours. HbA1C: No results for input(s): HGBA1C in the last 72 hours. CBG: No results for  input(s): GLUCAP in the last 168 hours. Lipid Profile: No results for input(s): CHOL, HDL, LDLCALC, TRIG, CHOLHDL, LDLDIRECT in the last 72 hours. Thyroid Function Tests: No results for input(s): TSH, T4TOTAL, FREET4, T3FREE, THYROIDAB in the last 72 hours. Anemia Panel: No results for input(s): VITAMINB12, FOLATE, FERRITIN, TIBC, IRON, RETICCTPCT in the last 72 hours. Urine analysis:    Component Value Date/Time   COLORURINE YELLOW 06/02/2012 1200   APPEARANCEUR CLEAR 06/02/2012 1200   LABSPEC 1.010 06/02/2012 1200   PHURINE 6.5 06/02/2012 1200   GLUCOSEU NEGATIVE 06/02/2012 1200   HGBUR NEGATIVE 06/02/2012 1200  BILIRUBINUR NEGATIVE 06/02/2012 1200   KETONESUR NEGATIVE 06/02/2012 1200   PROTEINUR NEGATIVE 06/02/2012 1200   UROBILINOGEN 4.0 (H) 06/02/2012 1200   NITRITE NEGATIVE 06/02/2012 1200   LEUKOCYTESUR NEGATIVE 06/02/2012 1200   Sepsis Labs: @LABRCNTIP (procalcitonin:4,lacticidven:4) )No results found for this or any previous visit (from the past 240 hour(s)).   Radiological Exams on Admission: Dg Chest 2 View  Result Date: 11/13/2016 CLINICAL DATA:  Nonproductive congested cough, chronic. Fluid retention in the lower extremities. EXAM: CHEST  2 VIEW COMPARISON:  PA and lateral chest x-ray of July 08, 2015 FINDINGS: The lungs are adequately inflated. There is a small right pleural effusion. The interstitial markings of both lungs are increased. The pulmonary vascularity is engorged. The cardiac silhouette is enlarged. There is calcification in the wall of the aortic arch. The observed bony thorax exhibits no acute abnormality. IMPRESSION: CHF with mild pulmonary interstitial edema. Small right pleural effusion. Electronically Signed   By: David  [place] M.D.   On: 11/13/2016 17:04    EKG: Independently reviewed. A. fib with rate control with left anterior fascicular block.  Assessment/Plan Principal Problem:   Acute on chronic diastolic CHF (congestive heart failure)  (HCC) Active Problems:   Atrial fibrillation (HCC)   CHF (congestive heart failure) (HCC)   Hypertension    1. Acute on chronic diastolic CHF with chronic lower extremity edema - I have placed patient on Lasix 80 mg IV every 8 hourly. Continue spironolactone. Patient is usually on Demadex. Closely following take output metabolic panel. Patient's last EF measured in 2016 was 55-60%. I have also added low-dose lisinopril 2.5 mg. 2. Chronic bilateral lower extremity wounds - wound team consult. 3. Chronic atrial fibrillation - chads 2 vasc score more than 2. Patient is on Apixaban. Continue Coreg. 4. Hypertension on Coreg and I have added low-dose lisinopril. Follow metabolic panel. 5. Hyperlipidemia on statins. 6. Chronic anemia - follow CBC. 7. Morbid obesity - dietitian consult.   DVT prophylaxis: Apixaban. Code Status: Full code.  Family Communication: Discussed with patient.  Disposition Plan: Home.  Consults called: Cardiology and wound team.  Admission status: Inpatient.    Eduard Clos MD [facility] Pager [phone].  If 7PM-7AM, please contact night-coverage [email or web address] Password [facility]  11/13/2016, 10:42 PM

## 2016-11-13 NOTE — ED Notes (Signed)
Message sent to pharmacy requesting lasix to be verified.

## 2016-11-13 NOTE — ED Provider Notes (Signed)
[facility]-EMERGENCY DEPT Provider Note   CSN: [id number] Arrival date & time: 11/13/16  1408     History   Chief Complaint Chief Complaint  Patient presents with  . Shortness of Breath  . Leg Swelling    HPI Jesus [name] is a 70 y.o. male with an extensive medical history that includes CHF, afib, and HTN that presents today with increased swelling of bilateral LE. Per patient, his swelling is worse than normal in the context of medical compliance. He denies fever, HA, weight gain or loss, N/V/D, abd pain, chest pain, SOB, cough, pain or weakness of LE. Patient states that his daughter spoke with his PCP who encouraged him to come to the ED. This has not been confirmed with daughter who is not present in the ED. Patient states that he has an exacerbation of swelling similar to this yearly and he comes into the hospital to have fluid removed. Per records, patient was last admitted on 07/08/15 for CHF exacerbation.   HPI  Past Medical History:  Diagnosis Date  . Atrial fibrillation    a. chronic   . Chronic diastolic CHF (congestive heart failure) 12/2002   a. EF 50-55% by echo 04/2012  . Edema of lower extremity   . Gout   . H/O: GI bleed    "cause I took too much aspirin"  . Hyperglycemia    Noted 05/2012  . Hypertension   . Morbid obesity   . Venous stasis ulcer     Patient Active Problem List   Diagnosis Date Noted  . Hyponatremia 07/15/2015  . Cellulitis of right lower extremity 07/15/2015  . Acute on chronic renal failure (HCC)   . Hypokalemia   . Chronic atrial fibrillation (HCC) 07/09/2015  . Chronic anemia 07/09/2015  . CHF exacerbation (HCC) 07/08/2015  . Chronic diastolic CHF (congestive heart failure) (HCC) 04/02/2015  . Chronic diastolic heart failure (HCC) 01/25/2015  . Cellulitis 01/17/2015  . Pulmonary vascular congestion   . Congestive heart disease (HCC)   . Peripheral edema   . Acute exacerbation of CHF (congestive heart failure) (HCC) 01/04/2015  .  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 Renal artery stenosis (HCC) 02/17/2014  . Peripheral vascular disease (HCC) 04/15/2013  . Chronic venous insufficiency 04/15/2013  . Morbid obesity (HCC) 06/11/2012  . Acute on chronic diastolic CHF (congestive heart failure) (HCC) 06/02/2012  . Unspecified essential hypertension 05/18/2012  . Atrial fibrillation (HCC) 04/27/2012  . CHF (congestive heart failure) (HCC) 04/27/2012  . Hypertension 04/27/2012    Past Surgical History:  Procedure Laterality Date  . NO PAST SURGERIES         Home Medications    Prior to Admission medications   Medication Sig Start Date End Date Taking? Authorizing Provider  apixaban (ELIQUIS) 5 MG TABS tablet Take 1 tablet (5 mg total) by mouth 2 (two) times daily. 07/18/15   Shanker Levora Dredge, MD  atorvastatin (LIPITOR) 80 MG tablet Take 1 tablet (80 mg total) by mouth daily. 07/18/15   Shanker Levora Dredge, MD  carvedilol (COREG) 6.25 MG tablet Take 1 tablet (6.25 mg total) by mouth 2 (two) times daily with a meal. 07/18/15   Shanker Levora Dredge, MD  doxycycline (VIBRA-TABS) 100 MG tablet Take 1 tablet (100 mg total) by mouth every 12 (twelve) hours. Patient not taking: Reported on 07/31/2015 07/18/15   Maretta Bees, MD  lisinopril (PRINIVIL,ZESTRIL) 10  MG tablet Take 1 tablet (10 mg total) by mouth daily. Patient not taking: Reported on 07/31/2015 07/18/15   Maretta BeesShanker M Ghimire, MD  pantoprazole (PROTONIX) 40 MG tablet Take 1 tablet (40 mg total) by mouth daily. 07/18/15   Shanker Levora DredgeM Ghimire, MD  potassium chloride SA (K-DUR,KLOR-CON) 20 MEQ tablet Take 2 tablets (40 mEq total) by mouth 2 (two) times daily. 07/18/15   Shanker Levora DredgeM Ghimire, MD  saccharomyces boulardii (FLORASTOR) 250 MG capsule Take 1 capsule (250 mg total) by mouth 2 (two) times daily. Patient not taking: Reported on 07/31/2015 07/18/15   Maretta BeesShanker M  Ghimire, MD  spironolactone (ALDACTONE) 25 MG tablet Take 1 tablet (25 mg total) by mouth daily. Start 8/11 07/19/15   Shanker Levora DredgeM Ghimire, MD  torsemide (DEMADEX) 20 MG tablet Take 3 tablets (60 mg total) by mouth 2 (two) times daily. Start 8/11 07/18/15   Shanker Levora DredgeM Ghimire, MD    Family History Family History  Problem Relation Age of Onset  . Arrhythmia Father   . Hypertension Father   . Cancer Mother     cervical    Social History Social History  Substance Use Topics  . Smoking status: Former Smoker    Types: Cigarettes    Quit date: 12/08/1965  . Smokeless tobacco: Never Used     Comment: "social cigarette smoker in my late teens"  . Alcohol use 8.4 oz/week    14 Cans of beer per week     Allergies   Amlodipine besylate   Review of Systems Review of Systems  ROS 10 Systems reviewed and are negative for acute change except as noted in the HPI.     Physical Exam Updated Vital Signs BP 159/81   Pulse 66   Temp 97.7 F (36.5 C) (Oral)   Resp 23   SpO2 96%   Physical Exam  Constitutional: He is oriented to person, place, and time. He appears well-developed.  HENT:  Head: Normocephalic and atraumatic.  Eyes: Conjunctivae and EOM are normal. Pupils are equal, round, and reactive to light.  Neck: Normal range of motion. Neck supple.  Cardiovascular: Normal rate and regular rhythm.   Pulmonary/Chest: Effort normal and breath sounds normal.  Abdominal: Soft. Bowel sounds are normal. He exhibits no distension. There is no tenderness.  Musculoskeletal: He exhibits edema.  2+ pitting edema. Pt has wrapped her legs in ace bandage.   Neurological: He is alert and oriented to person, place, and time.  Skin: Skin is warm.  Nursing note and vitals reviewed.   ED Treatments / Results  Labs (all labs ordered are listed, but only abnormal results are displayed) Labs Reviewed  CBC - Abnormal; Notable for the following:       Result Value   Hemoglobin 12.4 (*)    HCT  37.4 (*)    All other components within normal limits  BRAIN NATRIURETIC PEPTIDE - Abnormal; Notable for the following:    B Natriuretic Peptide 142.6 (*)    All other components within normal limits  BASIC METABOLIC PANEL - Abnormal; Notable for the following:    Sodium 126 (*)    Chloride 88 (*)    Glucose, Bld 103 (*)    All other components within normal limits    EKG  EKG Interpretation None       Radiology No results found.  Procedures Procedures (including critical care time)  Medications Ordered in ED Medications - No data to display   Initial Impression / Assessment and  Plan / ED Course  I have reviewed the triage vital signs and the nursing notes.  Pertinent labs & imaging results that were available during my care of the patient were reviewed by me and considered in my medical decision making (see chart for details).  Patient with history of CHF, afib, and HTN presents with increased LE edema with bilateral crackles in lower lung lobes and scattered expiratory wheezes. Possible etiologies include CHF exacerbation, electrolyte imbalance, drug reaction, nephrotic syndrome.  Clinical Course    Pt comes in with cc of leg swelling. Pt is not the best historian - but he reports that he has increased fluid retention and was asked to come to the ER for admission so that he cab be diuresed.  - I called the home number provided - and no one picked up. - I called pt's [name] - Ms. Earlene PlaterDavis, and left a message for her to ensure a prompt follow up is completed. - I have consulted Case Management to ensure pt's living situation is not dangerous. Apparently there is a wound care team going to see the patient twice every week. - I have consulted Cards to see if any med changes need to be made. The consult really made due to patient's request. I appreciate Cardiology team taking out the time to see him.  Final Clinical Impressions(s) / ED Diagnoses   Final diagnoses:  Congestive  heart failure, unspecified congestive heart failure chronicity, unspecified congestive heart failure type [facility](HCC)    New Prescriptions New Prescriptions   No medications on file     Derwood KaplanAnkit Rupert Azzara, MD 11/13/16 1635

## 2016-11-13 NOTE — Care Management (Signed)
ED CM met with patient at bedside in room. Patient reports being active with [facility].  CM contacted [facility], she states, that patient is living in unsafe condition, and patient  has not been compliant with follow-up.  [facility] nurse was at the home today and  he was told that they will have to discharge him if he is not seen by a medical provider. CM also contacted Tomasa Rand RN with [facility] and was told,  attempted to address living conditions, but due to complex family dynamics patient was discharged from [facility] due to safety concerns. ED CM will continue to follow for transitional care plan.

## 2016-11-14 DIAGNOSIS — I10 Essential (primary) hypertension: Secondary | ICD-10-CM

## 2016-11-14 DIAGNOSIS — I5033 Acute on chronic diastolic (congestive) heart failure: Secondary | ICD-10-CM

## 2016-11-14 DIAGNOSIS — I480 Paroxysmal atrial fibrillation: Secondary | ICD-10-CM

## 2016-11-14 LAB — CBC WITH DIFFERENTIAL/PLATELET
Basophils Absolute: 0 10*3/uL (ref 0.0–0.1)
Basophils Relative: 0 %
Eosinophils Absolute: 0.4 10*3/uL (ref 0.0–0.7)
Eosinophils Relative: 5 %
HEMATOCRIT: 37.9 % — AB (ref 39.0–52.0)
Hemoglobin: 12.5 g/dL — ABNORMAL LOW (ref 13.0–17.0)
LYMPHS PCT: 7 %
Lymphs Abs: 0.5 10*3/uL — ABNORMAL LOW (ref 0.7–4.0)
MCH: 28.5 pg (ref 26.0–34.0)
MCHC: 33 g/dL (ref 30.0–36.0)
MCV: 86.3 fL (ref 78.0–100.0)
MONO ABS: 1.1 10*3/uL — AB (ref 0.1–1.0)
MONOS PCT: 15 %
NEUTROS ABS: 5.4 10*3/uL (ref 1.7–7.7)
Neutrophils Relative %: 73 %
Platelets: 256 10*3/uL (ref 150–400)
RBC: 4.39 MIL/uL (ref 4.22–5.81)
RDW: 13.6 % (ref 11.5–15.5)
WBC: 7.4 10*3/uL (ref 4.0–10.5)

## 2016-11-14 LAB — BASIC METABOLIC PANEL
ANION GAP: 13 (ref 5–15)
BUN: 10 mg/dL (ref 6–20)
CALCIUM: 9 mg/dL (ref 8.9–10.3)
CO2: 30 mmol/L (ref 22–32)
Chloride: 87 mmol/L — ABNORMAL LOW (ref 101–111)
Creatinine, Ser: 0.92 mg/dL (ref 0.61–1.24)
GFR calc Af Amer: >60 mL/min (ref >60)
GFR calc non Af Amer: >60 mL/min (ref >60)
GLUCOSE: 99 mg/dL (ref 65–99)
POTASSIUM: 3.5 mmol/L (ref 3.5–5.1)
Sodium: 130 mmol/L — ABNORMAL LOW (ref 135–145)

## 2016-11-14 LAB — TSH: TSH: 2.237 u[IU]/mL (ref 0.350–4.500)

## 2016-11-14 MED ORDER — FUROSEMIDE 10 MG/ML IJ SOLN
80.0000 mg | Freq: Three times a day (TID) | INTRAMUSCULAR | Status: DC
  Administered 2016-11-14 – 2016-11-17 (×9): 80 mg via INTRAVENOUS
  Filled 2016-11-14 (×9): qty 8

## 2016-11-14 MED ORDER — PRO-STAT SUGAR FREE PO LIQD
30.0000 mL | Freq: Two times a day (BID) | ORAL | Status: DC
  Administered 2016-11-14 – 2016-11-16 (×6): 30 mL via ORAL
  Filled 2016-11-14 (×6): qty 30

## 2016-11-14 MED ORDER — ADULT MULTIVITAMIN W/MINERALS CH
1.0000 | ORAL_TABLET | Freq: Every day | ORAL | Status: DC
  Administered 2016-11-14 – 2016-11-17 (×4): 1 via ORAL
  Filled 2016-11-14 (×4): qty 1

## 2016-11-14 NOTE — Consult Note (Addendum)
WOC Nurse wound consult note Reason for Consult: Consult requested for bilat legs.  Pt states he has been wearing Una boots prior to admission and they are usually changed twice a week, but the recent wraps were in place for a week and he has had a large amt leakage trapped underneath the dressings. Wound type: Left leg without open wounds, odor, or drainage, generalized edema, dry cracked skin. Right leg with more generalized edema than LLE; white, macerated skin which is loose and beginning to peel and reveal patchy areas of partial thickness skin loss, which are red and moist.  Affected areas extend from right heel, foot, and lower calf. Large amt yellow drainage with strong odor.  Toes on bilat feet are macerated and crusted with dry scabs from previous drainage. Dressing procedure/placement/frequency: Paged ortho tech to apply bilat Una boots and coban and change Q Fri and Tues.  Pt should resume followup with home health after discharge for continued compression wrap changes. Please re-consult if further assistance is needed.  Thank-you,  Cammie Mcgeeawn Cameka Rae MSN, RN, CWOCN, [place]AP, CNS [phone]

## 2016-11-14 NOTE — Progress Notes (Signed)
Initial Nutrition Assessment  DOCUMENTATION CODES:   Obesity unspecified  INTERVENTION:  Provide Multivitamin with minerals Provide Pro-stat BID, each dose provides 100 kcal and 15 grams of protein F/up at later date to re-attempt diet education   NUTRITION DIAGNOSIS:   Food and nutrition related knowledge deficit related to social / environmental circumstances, chronic illness as evidenced by moderate to severe fluid accumulation.   GOAL:   Patient will meet greater than or equal to 90% of their needs   MONITOR:   PO intake, Skin, I & O's, Labs, Weight trends  REASON FOR ASSESSMENT:   Consult Assessment of nutrition requirement/status  ASSESSMENT:   70 y.o. male with CHF, hypertension, chronic atrial fibrillation, chronic lower extremity wound, hyperlipidemia presents to the ER because of worsening lower extremity edema. Patient states he has been coming to the hospital every year for worsening edema but had come late this year. Denies any fever chills chest pain or shortness of breath. Patient states he usually sleeps on a recliner and does not sleep flat. In the ER chest x-ray shows congestion concerning for CHF and patient has significant lower extremity edema bilaterally.  Pt asleep at time of RD visit, but awoke upon name being called by RD. He states that he has a good appetite and is eating well. RD asked if patient had any questions about Heart Healthy diet. Pt states he has not questions and has not used any salt for years. RD asked if patient checked nutrition labels for sodium content and patient stated yes, but he was unable to expand on amount of sodium limit. RD encouraged fresh foods, fruits, vegetables, etc. Pt became lethargic and said he was not doing well right now; he requested RD return another time. Pt is noted to be obese, ill-appearing and malodorous with swelling and weeping of lower extremities. Per RN, pt has bed bugs and was found alone at home covered  in urine and feces.   Labs: low sodium, low chloride  Diet Order:  Diet Heart Room service appropriate? Yes; Fluid consistency: Thin; Fluid restriction: 1200 mL Fluid  Skin:  Wound (see comment) (cellulitis on lower legs)  Last BM:  12/7  Height:   Ht Readings from Last 1 Encounters:  11/13/16 5\' 10"  (1.778 m)    Weight:   Wt Readings from Last 1 Encounters:  11/14/16 (!) 308 lb 9.6 oz (140 kg)    Ideal Body Weight:  75.5 kg  BMI:  Body mass index is 44.28 kg/m.  Estimated Nutritional Needs:   Kcal:  1800-2000  Protein:  110-125 grams  Fluid:  1.2 L restriction per MD  EDUCATION NEEDS:   Education needs no appropriate at this time  Dorothea Ogleeanne Shaquella Stamant RD, CSP, LDN Inpatient Clinical Dietitian Pager: [phone] After Hours Pager: [phone]

## 2016-11-14 NOTE — Progress Notes (Signed)
Orthopedic Tech Progress Note Patient Details:  Jesus [name] 1945/12/25 [id number]  Ortho Devices Type of Ortho Device: Roland RackUnna boot Ortho Device/Splint Location: Bilateral unna boots Ortho Device/Splint Interventions: Application   Jesus [name] 11/14/2016, 2:18 PM

## 2016-11-14 NOTE — Consult Note (Signed)
   [facility]   11/14/2016  Jesus [name] 09/19/1946 [id number]  Patient screened for potential [facility] Care Management for restart of services.  Chart review reveals form notes, the patient is [name] is a 70 y.o. male with CHF, hypertension, chronic atrial fibrillation, chronic lower extremity wound, hyperlipidemia presents to the ER because of worsening lower extremity edema. Patient states he has been coming to the hospital every year for worsening edema but had come late this year. Denies any fever chills chest pain or shortness of breath. Patient states he usually sleeps on a recliner and does not sleep flat. In the ER chest x-ray shows congestion concerning for HF and patient has significant lower extremity edema bilaterally. Patient is being admitted for IV diuresis.   [facility] under patient's [facility] Medicare plan. Patient's disposition is unknown at this time. He is from home and had a daughter previously involved.  Patient si being examined. Will follow up with inpatient RNCM for needs.   Please place a [facility] consult for needs or for questions contact:   Charlesetta ShanksVictoria Zameer Borman, RN BSN CCM [facility] Liaison  [phone] business mobile phone Toll free office [phone]

## 2016-11-14 NOTE — Progress Notes (Signed)
   11/14/16 1015  Clinical Encounter Type  Visited With Patient  Visit Type Initial  Referral From Chaplain  Consult/Referral To Chaplain  Recommendations (follow up at a later time)  Stress Factors  Patient Stress Factors None identified;Not reviewed  Family Stress Factors None identified  Pt. Declined, nurse advised as well not a good time to visit.  Chaplain let pt. Know that was present.  Pt. Advised to come back another time.  Will follow up later time at pt. request.  Lillie Fragminhaplain Sayre Mazor  [phone]

## 2016-11-14 NOTE — Progress Notes (Signed)
Refused bed alarm.  

## 2016-11-14 NOTE — Progress Notes (Addendum)
Patient Name: Jesus [name] Date of Encounter: 11/14/2016  [facility]  Hospital Problem List     Principal Problem:   Acute on chronic diastolic CHF (congestive heart failure) (HCC) Active Problems:   Atrial fibrillation (HCC)   CHF (congestive heart failure) (HCC)   Hypertension    Patient Profile     70 y/o male with history of chronic atrial fibrillation, resistant HTN, and diastolic CHF admitted for a/c diastolic CHF with volume overload, with JVD + HJR and worsening LEE with ? Lower extremity cellulitis.   Subjective   Upset that he did not get his lasix earlier today. No dyspnea.   Inpatient Medications    Scheduled Meds: . apixaban  5 mg Oral BID  . atorvastatin  80 mg Oral Daily  . carvedilol  6.25 mg Oral BID WC  . furosemide  80 mg Intravenous Q8H  . lisinopril  2.5 mg Oral Daily  . pantoprazole  40 mg Oral Daily  . potassium chloride SA  40 mEq Oral BID  . spironolactone  25 mg Oral Daily   Continuous Infusions:  PRN Meds: acetaminophen **OR** acetaminophen, ondansetron **OR** ondansetron (ZOFRAN) IV   Vital Signs    Vitals:   11/13/16 2100 11/13/16 2203 11/14/16 0545 11/14/16 0835  BP: (!) 144/53 (!) 166/68 (!) 165/97 (!) 151/96  Pulse: 65 64 (!) 42 70  Resp: 15 (!) 22 20 18   Temp:  97.6 F (36.4 C) 97.5 F (36.4 C) 98.2 F (36.8 C)  TempSrc:  Oral Oral Oral  SpO2: 95% 99% 99% 95%  Weight:  (!) 309 lb 14.4 oz (140.6 kg) (!) 308 lb 9.6 oz (140 kg)   Height:  5\' 10"  (1.778 m)      Intake/Output Summary (Last 24 hours) at 11/14/16 1206 Last data filed at 11/14/16 1040  Gross per 24 hour  Intake              175 ml  Output              600 ml  Net             -425 ml   Filed Weights   11/13/16 2203 11/14/16 0545  Weight: (!) 309 lb 14.4 oz (140.6 kg) (!) 308 lb 9.6 oz (140 kg)    Physical Exam   General: NAD, obese, foul odor  Psych: Normal affect. Neuro: Alert and oriented X 3. Moves all extremities  spontaneously. HEENT: Normal           Neck: Supple without bruits or JVD. Lungs:  Resp regular and unlabored, CTA. Heart: irreguarlly no s3, s4, or murmurs. Abdomen: Soft, non-tender, non-distended, BS + x 4.  Extremities: bilateral LEE, appears to have drainage of serous fluid/crusting on legs and bilateral. DP/PT/Radials 2+ and equal bilaterally.  Labs    CBC  Recent Labs  11/13/16 1435 11/14/16 0204  WBC 6.9 7.4  NEUTROABS  --  5.4  HGB 12.4* 12.5*  HCT 37.4* 37.9*  MCV 85.8 86.3  PLT 263 256   Basic Metabolic Panel  Recent Labs  11/13/16 1435 11/14/16 0204  NA 126* 130*  K 3.9 3.5  CL 88* 87*  CO2 30 30  GLUCOSE 103* 99  BUN 11 10  CREATININE 1.02 0.92  CALCIUM 8.9 9.0   Liver Function Tests No results for input(s): AST, ALT, ALKPHOS, BILITOT, PROT, ALBUMIN in the last 72 hours. No results for input(s): LIPASE, AMYLASE in the last 72 hours. Cardiac  Enzymes No results for input(s): CKTOTAL, CKMB, CKMBINDEX, TROPONINI in the last 72 hours. BNP Invalid input(s): POCBNP D-Dimer No results for input(s): DDIMER in the last 72 hours. Hemoglobin A1C No results for input(s): HGBA1C in the last 72 hours. Fasting Lipid Panel No results for input(s): CHOL, HDL, LDLCALC, TRIG, CHOLHDL, LDLDIRECT in the last 72 hours. Thyroid Function Tests  Recent Labs  11/13/16 2348  TSH 2.237    Telemetry    Atrial fibrillation - Personally Reviewed   Radiology    Dg Chest 2 View  Result Date: 11/13/2016 CLINICAL DATA:  Nonproductive congested cough, chronic. Fluid retention in the lower extremities. EXAM: CHEST  2 VIEW COMPARISON:  PA and lateral chest x-ray of July 08, 2015 FINDINGS: The lungs are adequately inflated. There is a small right pleural effusion. The interstitial markings of both lungs are increased. The pulmonary vascularity is engorged. The cardiac silhouette is enlarged. There is calcification in the wall of the aortic arch. The observed bony thorax  exhibits no acute abnormality. IMPRESSION: CHF with mild pulmonary interstitial edema. Small right pleural effusion. Electronically Signed   By: David  SwazilandJordan M.D.   On: 11/13/2016 17:04     Patient Profile     70 y/o male with history of chronic atrial fibrillation, resistant HTN, and diastolic CHF admitted for a/c diastolic CHF with volume overload, with JVD + HJR and worsening LEE with ? Lower extremity cellulitis.   Assessment & Plan    1. Acute on Chronic Diastolic CHF: pt received dose of 80 mg IV lasix last night, but none given today yet. Order written for 80 mg IV Q8H. Spoke with RN. They forgot to give at 6am.  Give dose of lasix now. Monitor UOP. Strict I/Os. Monitor renal function and K. Wound care is assisting with leg wounds.    2. Chronic Atrial Fibrillation: rate is controlled. Continue Coreg and Eliquis.   3. HTN:  Moderately elevated. Continue Coreg, lisinopril and spironolactone. Given IV lasix now. Monitor  4. Chronic Bilateral LE Wounds: Wound team following.   Signed, Robbie LisBrittainy Simmons, [name]  11/14/2016, 12:06 PM

## 2016-11-14 NOTE — Progress Notes (Signed)
Patient refused bed alarm. Will continue to monitor.  

## 2016-11-14 NOTE — Progress Notes (Signed)
Pt states that he is continent of bladder and bowel, however, continues to urinate on himself, in chair on pad, on floor.  Therefore, unable to achieve accurate I/O's r/t lasix administration.  Zollie BeckersSabrina Pandora Mccrackin RN

## 2016-11-14 NOTE — Progress Notes (Signed)
Jesus [name] [id number] DOB: 04-07-1946 DOA: 11/13/2016 PCP: [name],[name], [name]  Brief narrative:  70 y/o ? Known chr Afib CHad2Vasc2 score=3 sionce 8/12/-3 Diastolic HF since 2003 Chr Hyponatremia Morbid obesity, Body mass index is 44.28 kg/m.  Anemia of chr disease Chr venous insuff  Admitted with vol overload  Past medical history-As per Problem list Chart reviewed as below-   Consultants:    Procedures:    Antibiotics:     Subjective  Poor historian Unable to tell me much of why he is here Cannot tell me when his feet have been dressed but does manage to say that he hasn't dressed twice a week No nausea no vomiting    Objective    Interim History:   Telemetry:    Objective: Vitals:   11/13/16 1900 11/13/16 2100 11/13/16 2203 11/14/16 0545  BP: 154/77 (!) 144/53 (!) 166/68 (!) 165/97  Pulse:  65 64 (!) 42  Resp: 12 15 (!) 22 20  Temp:   97.6 F (36.4 C) 97.5 F (36.4 C)  TempSrc:   Oral Oral  SpO2:  95% 99% 99%  Weight:   (!) 140.6 kg (309 lb 14.4 oz) (!) 140 kg (308 lb 9.6 oz)  Height:   5\' 10"  (1.778 m)     Intake/Output Summary (Last 24 hours) at 11/14/16 0736 Last data filed at 11/13/16 2300  Gross per 24 hour  Intake                0 ml  Output              600 ml  Net             -600 ml    Exam:  General: EOMI NCAT Cardiovascular: S1-S2 without murmur ? M Respiratory: Clinically clear no added sound Abdomen: Obese nontender nondistended Skin at least grade 2 lower extremity edema, right foot appears red.  Great toe appears enlarged and the toes are red and erythematous there is some maceration to the skin Neuro intact  Data Reviewed: Basic Metabolic Panel:  Recent Labs Lab 11/13/16 1435 11/14/16 0204  NA 126* 130*  K 3.9 3.5  CL 88* 87*  CO2 30 30  GLUCOSE 103* 99  BUN 11 10  CREATININE 1.02 0.92  CALCIUM 8.9 9.0   Liver Function Tests: No results for input(s): AST, ALT, ALKPHOS, BILITOT, PROT,  ALBUMIN in the last 168 hours. No results for input(s): LIPASE, AMYLASE in the last 168 hours. No results for input(s): AMMONIA in the last 168 hours. CBC:  Recent Labs Lab 11/13/16 1435 11/14/16 0204  WBC 6.9 7.4  NEUTROABS  --  5.4  HGB 12.4* 12.5*  HCT 37.4* 37.9*  MCV 85.8 86.3  PLT 263 256   Cardiac Enzymes: No results for input(s): CKTOTAL, CKMB, CKMBINDEX, TROPONINI in the last 168 hours. BNP: Invalid input(s): POCBNP CBG: No results for input(s): GLUCAP in the last 168 hours.  No results found for this or any previous visit (from the past 240 hour(s)).   Studies:              All Imaging reviewed and is as per above notation   Scheduled Meds: . apixaban  5 mg Oral BID  . atorvastatin  80 mg Oral Daily  . carvedilol  6.25 mg Oral BID WC  . furosemide  80 mg Intravenous Q8H  . lisinopril  2.5 mg Oral Daily  . pantoprazole  40 mg Oral Daily  . potassium  chloride SA  40 mEq Oral BID  . spironolactone  25 mg Oral Daily   Continuous Infusions:   Assessment/Plan:  1. AECHF-Diastolic-diurese as per Cardiology lasix 80 iv TID, aldactone 25 qd.  Labs am.  Usual basleine weight 295-300.   2. ? Cellulitis RLE-monitor feet.  Diurese. Elevate legs.  Will look at R LE again and if swollen, painful and has a WBC or fever can initiate Abx 3. uncontrolled htn-if not controlled add nitrate and hydralazine in am 4. Hypervolemic hyponatremia 2/2 to CHF-should improve with diuresis 5. Morbid obesity, Body mass index is 44.28 kg/m.-needs  Weight loss counselling 6. Chr Afib CHad2Vasc2 score=3 on eliquis-cont coreg 6.25 bid.  Rate controlled 7. Chr Venous insuff-see above 8. Poor social situation-S/w to look into home situation-found disheveled as well as in feces.  Might need SNF  No family Inpatient pending diuresis tele  Pleas KochJai Korey Prashad, MD  [facility] Pager [phone] 11/14/2016, 7:36 AM    LOS: 1 day

## 2016-11-14 NOTE — Progress Notes (Signed)
CM following for DCP; awaiting on Physical Therapy recommendation; Tamala FothergillB Dianne Bady RN, MHA,BSN [phone]

## 2016-11-15 LAB — BASIC METABOLIC PANEL
Anion gap: 10 (ref 5–15)
BUN: 24 mg/dL — AB (ref 6–20)
CALCIUM: 8.8 mg/dL — AB (ref 8.9–10.3)
CHLORIDE: 88 mmol/L — AB (ref 101–111)
CO2: 32 mmol/L (ref 22–32)
CREATININE: 1.15 mg/dL (ref 0.61–1.24)
Glucose, Bld: 135 mg/dL — ABNORMAL HIGH (ref 65–99)
Potassium: 4.1 mmol/L (ref 3.5–5.1)
SODIUM: 130 mmol/L — AB (ref 135–145)

## 2016-11-15 MED ORDER — OXYCODONE HCL 5 MG PO TABS
5.0000 mg | ORAL_TABLET | Freq: Four times a day (QID) | ORAL | Status: DC | PRN
  Administered 2016-11-15 – 2016-11-16 (×3): 5 mg via ORAL
  Filled 2016-11-15 (×4): qty 1

## 2016-11-15 MED ORDER — DOXYCYCLINE HYCLATE 100 MG PO TABS
100.0000 mg | ORAL_TABLET | Freq: Two times a day (BID) | ORAL | Status: DC
  Administered 2016-11-15 – 2016-11-17 (×5): 100 mg via ORAL
  Filled 2016-11-15 (×5): qty 1

## 2016-11-15 MED ORDER — ISOSORB DINITRATE-HYDRALAZINE 20-37.5 MG PO TABS
1.0000 | ORAL_TABLET | Freq: Three times a day (TID) | ORAL | Status: DC
  Administered 2016-11-15 – 2016-11-17 (×6): 1 via ORAL
  Filled 2016-11-15 (×6): qty 1

## 2016-11-15 NOTE — Progress Notes (Signed)
Jesus [name] [id number] DOB: 12/20/1945 DOA: 11/13/2016 PCP: [name],[name], [name]  Brief narrative:  70 y/o ? Known chr Afib CHad2Vasc2 score=3 since 8/12/-3 Diastolic HF since 2003 Chr Hyponatremia Morbid obesity, Body mass index is 43.35 kg/m.  Anemia of chr disease Chr venous insuff  Admitted with vol overload  Past medical history-As per Problem list Chart reviewed as below-   Consultants:    Procedures:    Antibiotics:     Subjective    Patient not using urinal Educated today on hygiene as well as urinal use if not getting up States SOB marginally better No cp    Objective    Interim History:   Telemetry:    Objective: Vitals:   11/14/16 1938 11/14/16 2027 11/15/16 0531 11/15/16 0922  BP: 103/63 (!) 132/57 (!) 173/86 128/72  Pulse: (!) 109 68 68 80  Resp: 18 18 18    Temp: 97.7 F (36.5 C) 100 F (37.8 C) (!) 100.4 F (38 C)   TempSrc: Oral Oral Oral   SpO2: 100% 93% 95% 91%  Weight:   (!) 137 kg (302 lb 1.6 oz)   Height:        Intake/Output Summary (Last 24 hours) at 11/15/16 1310 Last data filed at 11/14/16 2031  Gross per 24 hour  Intake              415 ml  Output              200 ml  Net              215 ml    Exam:  General: EOMI NCAT Cardiovascular: S1-S2 without murmur ? M Respiratory: Clinically clear no added sound Abdomen: Obese nontender nondistended Skin at least grade 2 lower extremity edema, right foot appears red.  Great toe appears enlarged and the toes are red and erythematous there is some maceration to the skin Neuro intact  Data Reviewed: Basic Metabolic Panel:  Recent Labs Lab 11/13/16 1435 11/14/16 0204 11/15/16 0702  NA 126* 130* 130*  K 3.9 3.5 4.1  CL 88* 87* 88*  CO2 30 30 32  GLUCOSE 103* 99 135*  BUN 11 10 24*  CREATININE 1.02 0.92 1.15  CALCIUM 8.9 9.0 8.8*   Liver Function Tests: No results for input(s): AST, ALT, ALKPHOS, BILITOT, PROT, ALBUMIN in the last 168 hours. No  results for input(s): LIPASE, AMYLASE in the last 168 hours. No results for input(s): AMMONIA in the last 168 hours. CBC:  Recent Labs Lab 11/13/16 1435 11/14/16 0204  WBC 6.9 7.4  NEUTROABS  --  5.4  HGB 12.4* 12.5*  HCT 37.4* 37.9*  MCV 85.8 86.3  PLT 263 256   Cardiac Enzymes: No results for input(s): CKTOTAL, CKMB, CKMBINDEX, TROPONINI in the last 168 hours. BNP: Invalid input(s): POCBNP CBG: No results for input(s): GLUCAP in the last 168 hours.  No results found for this or any previous visit (from the past 240 hour(s)).   Studies:              All Imaging reviewed and is as per above notation   Scheduled Meds: . apixaban  5 mg Oral BID  . atorvastatin  80 mg Oral Daily  . carvedilol  6.25 mg Oral BID WC  . feeding supplement (PRO-STAT SUGAR FREE 64)  30 mL Oral BID  . furosemide  80 mg Intravenous Q8H  . lisinopril  2.5 mg Oral Daily  . multivitamin with minerals  1 tablet  Oral Daily  . pantoprazole  40 mg Oral Daily  . potassium chloride SA  40 mEq Oral BID  . spironolactone  25 mg Oral Daily   Continuous Infusions:   Assessment/Plan:  1. AECHF-Diastolic-diurese as per Cardiology lasix 80 iv TID, aldactone 25 qd.  Labs am.  Usual basleine weight 295-300.  Now 302.  Possible switch to orals in next 24-48 hr 2. Cellulitis RLE-monitor feet.  Diurese. Elevate legs.  As had low grade ferver, start on doxycycline 100 po bid for 5d 11/15/16 3. uncontrolled htn- added bidil tid 11/15/16 4. Hypervolemic hyponatremia 2/2 to CHF-should improve with diuresis, 126--->130 5. Morbid obesity, Body mass index is 43.35 kg/m.-needs  Weight loss counselling 6. Chr Afib CHad2Vasc2 score=3 on eliquis-cont coreg 6.25 bid.  Rate controlled 7. Chr Venous insuff-see above 8. Poor social situation-S/w to look into home situation-found disheveled as well as in feces.  Might need SNF  No family +, he has 3 children.  He plans to go to stay with one of them on d/c Inpatient pending  diuresis tele  Pleas KochJai Lena Gores, MD  [facility] Pager [phone] 11/15/2016, 1:10 PM    LOS: 2 days

## 2016-11-15 NOTE — Progress Notes (Signed)
Patient Name: Jesus MooresDouglas M Burandt Date of Encounter: 11/15/2016  [facility]  [facility] Problem List     Principal Problem:   Acute on chronic diastolic CHF (congestive heart failure) (HCC) Active Problems:   Atrial fibrillation (HCC)   CHF (congestive heart failure) (HCC)   Hypertension    Patient Profile     70 y/o male with history of chronic atrial fibrillation, resistant HTN, and diastolic CHF admitted for a/c diastolic CHF with volume overload, with JVD + HJR and worsening LEE with ? Lower extremity cellulitis.   Subjective   Refusing help with his urinary incontinence, cleaning at first. No dyspnea.   Inpatient Medications    Scheduled Meds: . apixaban  5 mg Oral BID  . atorvastatin  80 mg Oral Daily  . carvedilol  6.25 mg Oral BID WC  . feeding supplement (PRO-STAT SUGAR FREE 64)  30 mL Oral BID  . furosemide  80 mg Intravenous Q8H  . lisinopril  2.5 mg Oral Daily  . multivitamin with minerals  1 tablet Oral Daily  . pantoprazole  40 mg Oral Daily  . potassium chloride SA  40 mEq Oral BID  . spironolactone  25 mg Oral Daily   Continuous Infusions:  PRN Meds: acetaminophen **OR** acetaminophen, ondansetron **OR** ondansetron (ZOFRAN) IV, oxyCODONE   Vital Signs    Vitals:   11/14/16 1938 11/14/16 2027 11/15/16 0531 11/15/16 0922  BP: 103/63 (!) 132/57 (!) 173/86 128/72  Pulse: (!) 109 68 68 80  Resp: 18 18 18    Temp: 97.7 F (36.5 C) 100 F (37.8 C) (!) 100.4 F (38 C)   TempSrc: Oral Oral Oral   SpO2: 100% 93% 95% 91%  Weight:   (!) 302 lb 1.6 oz (137 kg)   Height:        Intake/Output Summary (Last 24 hours) at 11/15/16 1218 Last data filed at 11/14/16 2031  Gross per 24 hour  Intake              655 ml  Output              200 ml  Net              455 ml   Filed Weights   11/13/16 2203 11/14/16 0545 11/15/16 0531  Weight: (!) 309 lb 14.4 oz (140.6 kg) (!) 308 lb 9.6 oz (140 kg) (!) 302 lb 1.6 oz (137 kg)    Physical  Exam   General: NAD, obese, foul odor  Psych: Normal affect. Neuro: Alert and oriented X 3. Moves all extremities spontaneously. HEENT: Normal           Neck: Supple without bruits or JVD. Lungs:  Resp regular and unlabored, CTA. Heart: irreguarlly no s3, s4, or murmurs. Abdomen: Soft, non-tender, non-distended, BS + x 4. Morbidly obese Extremities: bilateral LEE, appears to have drainage of serous fluid/crusting on legs and bilateral. DP/PT/Radials 2+ and equal bilaterally. Smells of urine  Labs    CBC  Recent Labs  11/13/16 1435 11/14/16 0204  WBC 6.9 7.4  NEUTROABS  --  5.4  HGB 12.4* 12.5*  HCT 37.4* 37.9*  MCV 85.8 86.3  PLT 263 256   Basic Metabolic Panel  Recent Labs  11/14/16 0204 11/15/16 0702  NA 130* 130*  K 3.5 4.1  CL 87* 88*  CO2 30 32  GLUCOSE 99 135*  BUN 10 24*  CREATININE 0.92 1.15  CALCIUM 9.0 8.8*    Telemetry  Atrial fibrillation, No adverse arrhythmias - Personally Reviewed   Radiology    Dg Chest 2 View  Result Date: 11/13/2016 CLINICAL DATA:  Nonproductive congested cough, chronic. Fluid retention in the lower extremities. EXAM: CHEST  2 VIEW COMPARISON:  PA and lateral chest x-ray of July 08, 2015 FINDINGS: The lungs are adequately inflated. There is a small right pleural effusion. The interstitial markings of both lungs are increased. The pulmonary vascularity is engorged. The cardiac silhouette is enlarged. There is calcification in the wall of the aortic arch. The observed bony thorax exhibits no acute abnormality. IMPRESSION: CHF with mild pulmonary interstitial edema. Small right pleural effusion. Electronically Signed   By: David  SwazilandJordan M.D.   On: 11/13/2016 17:04     Patient Profile     70 y/o male with history of chronic atrial fibrillation, resistant HTN, and diastolic CHF admitted for a/c diastolic CHF with volume overload, with JVD + HJR and worsening LEE with ? Lower extremity cellulitis.   Assessment & Plan    1.  Acute on Chronic Diastolic CHF: Continue Lasix 80 mg IV Q8H. we are trying her best to monitor UOP but since he is incontinent and urinating on himself frequently this is a major challenge. Monitor renal function and K. Wound care is assisting with leg wounds.  Thankfully, weight is 302 down from 309.  2. Chronic Atrial Fibrillation: rate is controlled. Continue Coreg and Eliquis.   3. HTN:  Moderately elevated. Was under reasonable control yesterday Continue Coreg, lisinopril and spironolactone.  Monitor  4. Chronic Bilateral LE Wounds: Wound team following. Encourage cleaning. Unfortunately he is not receptive.  Signed, Donato SchultzMark Skains, MD  11/15/2016, 12:18 PM

## 2016-11-15 NOTE — Progress Notes (Signed)
Patient was drenched  In urine and refused to be washed. After several trials, he agreed to be assisted with his ADL. Was inc of both urine and small amount of stool.Room also cleaned with bleach wipes. No visible bugs seen.

## 2016-11-15 NOTE — Evaluation (Signed)
Physical Therapy Evaluation Patient Details Name: Jesus [name] MRN: [id number] DOB: 1946-07-31 Today's Date: 11/15/2016   History of Present Illness  Pt is a 70 y/o male admitted secondary to worsening bilateral LE edema. PMH including but not limited to chronic A-fib, CHF and HTN.  Clinical Impression  Pt presented sitting OOB in recliner when PT entered room. Prior to admission, pt reported that he was independent with ADLs and mod I with functional mobility; however, that he was only to ambulate short distances within his apartment holding onto furniture. He stated that when he needs to go to a store, he uses the motorized scooter carts. Pt currently requires mod A x2 for sit-to-stand transfers. Pt would continue to benefit from skilled physical therapy services at this time while admitted and after d/c to address his below listed limitations in order to improve his overall safety and independence with functional mobility.     Follow Up Recommendations SNF;Supervision/Assistance - 24 hour    Equipment Recommendations  None recommended by PT;Other (comment) (defer to next venue)    Recommendations for Other Services       Precautions / Restrictions Precautions Precautions: Fall Restrictions Weight Bearing Restrictions: No      Mobility  Bed Mobility               General bed mobility comments: pt sitting OOB in recliner when PT entered room  Transfers Overall transfer level: Needs assistance Equipment used: Rolling walker (2 wheeled) Transfers: Sit to/from Stand Sit to Stand: Mod assist;+2 physical assistance         General transfer comment: pt required increased time, VC'ing for bilateral hand placement and mod A x2 (assist from RN) to stand from recliner. However, pt unable to achieve full erect standing position despite verbal and tactile cues/assist. Pt also unable to move bilateral hands to RW. When he attempted to do so, he became anxious and sat back  down in recliner. Pt performed sit-to-stand x2 from recliner.  Ambulation/Gait                Stairs            Wheelchair Mobility    Modified Rankin (Stroke Patients Only)       Balance Overall balance assessment: Needs assistance Sitting-balance support: Feet supported;No upper extremity supported Sitting balance-Leahy Scale: Fair     Standing balance support: During functional activity;Bilateral upper extremity supported Standing balance-Leahy Scale: Poor Standing balance comment: pt reliant on bilateral UEs on support surface                             Pertinent Vitals/Pain Pain Assessment: Faces Faces Pain Scale: Hurts even more Pain Location: bilateral LEs Pain Descriptors / Indicators: Sore;Grimacing;Moaning Pain Intervention(s): Monitored during session    Home Living Family/patient expects to be discharged to:: Private residence Living Arrangements: Alone Available Help at Discharge: Family;Available PRN/intermittently Type of Home: Apartment Home Access: Level entry     Home Layout: One level Home Equipment: Cane - single point;Crutches      Prior Function Level of Independence: Independent with assistive device(s)         Comments: pt reported that he uses his cane to ambulate outside of his home, but basically furniture cruises when in his small one bedroom apartment. pt reports that he uses the motorized scooter carts when out shopping.      Hand Dominance  Extremity/Trunk Assessment   Upper Extremity Assessment: Generalized weakness           Lower Extremity Assessment: Generalized weakness         Communication   Communication: No difficulties  Cognition Arousal/Alertness: Awake/alert Behavior During Therapy: WFL for tasks assessed/performed Overall Cognitive Status: No family/caregiver present to determine baseline cognitive functioning                      General Comments       Exercises     Assessment/Plan    PT Assessment Patient needs continued PT services  PT Problem List Decreased strength;Decreased range of motion;Decreased activity tolerance;Decreased balance;Decreased mobility;Decreased coordination;Decreased knowledge of use of DME;Decreased safety awareness;Pain          PT Treatment Interventions DME instruction;Gait training;Functional mobility training;Therapeutic activities;Therapeutic exercise;Balance training;Neuromuscular re-education;Patient/family education    PT Goals (Current goals can be found in the Care Plan section)  Acute Rehab PT Goals Patient Stated Goal: decrease pain PT Goal Formulation: With patient Time For Goal Achievement: 11/29/16 Potential to Achieve Goals: Fair    Frequency Min 2X/week   Barriers to discharge        Co-evaluation               End of Session Equipment Utilized During Treatment: Gait belt Activity Tolerance: Patient limited by fatigue;Patient limited by pain Patient left: in chair;with call bell/phone within reach Nurse Communication: Mobility status         Time: [id number] PT Time Calculation (min) (ACUTE ONLY): 24 min   Charges:   PT Evaluation $PT Eval Moderate Complexity: 1 Procedure PT Treatments $Therapeutic Activity: 8-22 mins   PT G CodesAlessandra Bevels:        Mariadelcarmen Corella M Tristy Udovich 11/15/2016, 4:45 PM Deborah ChalkJennifer Cougar Imel, PT, DPT [phone]

## 2016-11-15 NOTE — Progress Notes (Signed)
Patient angry, yelling at staff that he wants his Advil he bought with him to the hospital.  I called MD who stated patient could not take Ibuprofen due to his heart failure.  Patient stated he did not care, that was his property and it was my job to make him comfortable.  I advised patient that he has Tylenol and oxy IR that he could take for pain and that he could not take the Advil.  Patient called daughter, I talked to daughter on the phone and she convinced patient to take the OxyIR.    Patient continues to be incontinent, urinates in the floor, on his pads, pillow and covers.  Did have patient stand up several times during the day and attempt to change the pads under him that were covered in urine.  RN and MD did ask patient to please try to use his urinal so his output could be measured.

## 2016-11-16 LAB — RENAL FUNCTION PANEL
ALBUMIN: 3.1 g/dL — AB (ref 3.5–5.0)
Anion gap: 10 (ref 5–15)
BUN: 44 mg/dL — AB (ref 6–20)
CHLORIDE: 91 mmol/L — AB (ref 101–111)
CO2: 29 mmol/L (ref 22–32)
CREATININE: 1.83 mg/dL — AB (ref 0.61–1.24)
Calcium: 9 mg/dL (ref 8.9–10.3)
GFR calc Af Amer: 41 mL/min — ABNORMAL LOW (ref >60)
GFR, EST NON AFRICAN AMERICAN: 36 mL/min — AB (ref >60)
Glucose, Bld: 111 mg/dL — ABNORMAL HIGH (ref 65–99)
PHOSPHORUS: 3.5 mg/dL (ref 2.5–4.6)
Potassium: 4.7 mmol/L (ref 3.5–5.1)
Sodium: 130 mmol/L — ABNORMAL LOW (ref 135–145)

## 2016-11-16 LAB — BASIC METABOLIC PANEL
ANION GAP: 10 (ref 5–15)
BUN: 39 mg/dL — ABNORMAL HIGH (ref 6–20)
CALCIUM: 8.9 mg/dL (ref 8.9–10.3)
CO2: 30 mmol/L (ref 22–32)
Chloride: 88 mmol/L — ABNORMAL LOW (ref 101–111)
Creatinine, Ser: 1.62 mg/dL — ABNORMAL HIGH (ref 0.61–1.24)
GFR, EST AFRICAN AMERICAN: 48 mL/min — AB (ref >60)
GFR, EST NON AFRICAN AMERICAN: 41 mL/min — AB (ref >60)
Glucose, Bld: 104 mg/dL — ABNORMAL HIGH (ref 65–99)
Potassium: 4.8 mmol/L (ref 3.5–5.1)
SODIUM: 128 mmol/L — AB (ref 135–145)

## 2016-11-16 MED ORDER — OXYCODONE HCL 5 MG PO TABS
10.0000 mg | ORAL_TABLET | Freq: Four times a day (QID) | ORAL | Status: DC | PRN
  Administered 2016-11-16 (×3): 10 mg via ORAL
  Filled 2016-11-16 (×3): qty 2

## 2016-11-16 NOTE — Progress Notes (Signed)
Patient called nurse yelling and demanding to take his Advil. Nurse inquired about patients pain. Patient stated he is in pain and nurse informed patient he has Oxycodone as needed and can have that for pain. Patient received oxycodone at 0038. Patient called nurse back into his room demanding to be given his Advil. Patient got angry yelling at staff. Nurse explained to patient that per policy, he cannot take his own medication from home and nurse could give him something else to address his pain. Patient threatened nurse and asked nurse and nurse tech to leave his room. Charge nurse notified of situation.  Patient continues to urinate on the floor. Nurse placed fecal pouch over to be able to measure output. Will continue to monitor patient. Petra Kubarica Izaha Shughart Rn 11/16/2016 2:41 AM

## 2016-11-16 NOTE — Progress Notes (Signed)
Orthopedic Tech Progress Note Patient Details:  Jesus [name] [birth year] [id number]  Ortho Devices Type of Ortho Device: Roland RackUnna boot Ortho Device/Splint Location: bilateral Ortho Device/Splint Interventions: Application   [name], Hadleigh Felber 11/16/2016, 12:24 PM Bilateral unna boots were replaced today because doctor removed them

## 2016-11-16 NOTE — Progress Notes (Signed)
RN, Comptrollerstudent nurse and Designer, multimedianurse tech Didier with gait belt assisted patient to stand.  Patient did little to help staff, called nurse tech a son of a bitch.  Patient also has no recollection of MD being in room, asked RN when doctor was coming.  Patient becomes loud and angry with staff when attempting to provide any care.  Continues to urinate in the floor.

## 2016-11-16 NOTE — Progress Notes (Signed)
Patient very sleepy during 7 a to 7 p shift.  Son in to visit, patient did not remember son being in room.  Daughter Jesus [name] and her children also came in to visit patient.  Daughter took patients clothes and hat home to wash.  Continues to urinate on the floor, RN and MD both reiterated today that we need him to use the urinal or agree to a condom cath so we can get some idea of output however patient still refuses to do this and urinates freely on himself and pads in floor.

## 2016-11-16 NOTE — Progress Notes (Addendum)
Jesus [name] [id number] DOB: Nov 05, 1946 DOA: 11/13/2016 PCP: [name],[name], [name]  Brief narrative:  70 y/o ? Known chr Afib CHad2Vasc2 score=3 since 8/12/-3 Diastolic HF since 2003 Chr Hyponatremia Morbid obesity, Body mass index is 44.72 kg/m.  Anemia of chr disease Chr venous insuff Probable OSA  Admitted with vol overload  Past medical history-As per Problem list Chart reviewed as below-   Consultants:    Procedures:    Antibiotics:     Subjective   Patient verbally abusive to nursing staff Long discussion with patient at bedside about mobility and possible need for SNF Requested again patient use Urinal   Objective    Interim History:   Telemetry:    Objective: Vitals:   11/15/16 1621 11/15/16 2022 11/16/16 0406 11/16/16 0854  BP: (!) 157/65 101/60 139/76 (!) 107/56  Pulse: 78 63 77 77  Resp:  18 18   Temp:  98.8 F (37.1 C) 97.9 F (36.6 C)   TempSrc:  Oral Oral   SpO2: 95% 92% 100% 94%  Weight:   (!) 141.4 kg (311 lb 11.2 oz)   Height:       No intake or output data in the 24 hours ending 11/16/16 1039  Exam:  General: EOMI NCAT Cardiovascular: S1-S2 without murmur ? M Respiratory: Clinically clear no added sound Abdomen: Obese nontender nondistended Skin at least grade 2 lower extremity edema, right foot appears red.  Unna boots unwrapped.  LE's look about the same Neuro intact  Data Reviewed: Basic Metabolic Panel:  Recent Labs Lab 11/13/16 1435 11/14/16 0204 11/15/16 0702 11/16/16 0225  NA 126* 130* 130* 128*  K 3.9 3.5 4.1 4.8  CL 88* 87* 88* 88*  CO2 30 30 32 30  GLUCOSE 103* 99 135* 104*  BUN 11 10 24* 39*  CREATININE 1.02 0.92 1.15 1.62*  CALCIUM 8.9 9.0 8.8* 8.9   Liver Function Tests: No results for input(s): AST, ALT, ALKPHOS, BILITOT, PROT, ALBUMIN in the last 168 hours. No results for input(s): LIPASE, AMYLASE in the last 168 hours. No results for input(s): AMMONIA in the last 168  hours. CBC:  Recent Labs Lab 11/13/16 1435 11/14/16 0204  WBC 6.9 7.4  NEUTROABS  --  5.4  HGB 12.4* 12.5*  HCT 37.4* 37.9*  MCV 85.8 86.3  PLT 263 256   Cardiac Enzymes: No results for input(s): CKTOTAL, CKMB, CKMBINDEX, TROPONINI in the last 168 hours. BNP: Invalid input(s): POCBNP CBG: No results for input(s): GLUCAP in the last 168 hours.  No results found for this or any previous visit (from the past 240 hour(s)).   Studies:              All Imaging reviewed and is as per above notation   Scheduled Meds: . apixaban  5 mg Oral BID  . atorvastatin  80 mg Oral Daily  . carvedilol  6.25 mg Oral BID WC  . doxycycline  100 mg Oral Q12H  . feeding supplement (PRO-STAT SUGAR FREE 64)  30 mL Oral BID  . furosemide  80 mg Intravenous Q8H  . isosorbide-hydrALAZINE  1 tablet Oral TID  . lisinopril  2.5 mg Oral Daily  . multivitamin with minerals  1 tablet Oral Daily  . pantoprazole  40 mg Oral Daily  . potassium chloride SA  40 mEq Oral BID  . spironolactone  25 mg Oral Daily   Continuous Infusions:   Assessment/Plan:  1. AECHF-Diastolic-diurese as per Cardiology lasix 80 iv TID, aldactone 25 qd.  Labs am.  Usual basleine weight 295-300.  Cannot get accurate weight or I/o as passing urine over floor.  continue 2. Cellulitis RLE-monitor feet.  Diurese. Elevate legs.  As had low grade ferver, start on doxycycline 100 po bid for 5d 11/15/16 3. uncontrolled htn- added bidil tid 11/15/16-slightly improved control.  Cont lisinopril 2.5, aldactone 25 od 4. Hypervolemic hyponatremia 2/2 to CHF-should improve with diuresis, 126-->130->128 5. Morbid obesity, Body mass index is 44.72 kg/m.-needs  Weight loss counselling 6. Chr Afib CHad2Vasc2 score=3 on eliquis-cont coreg 6.25 bid.  Rate controlled 7. Likely Obstructive sleep apea-patient falls asleep while taking to this examiner.  " I sleep just fine".  Very unlikely patient will agree to OSA screening 8. Chr Venous insuff-see  above 9. Poor social situation-S/w to look into home situation-found disheveled as well as in feces.  Might need SNF  Talked with daughter-patient resistant to moving around and cannot get up.  Daughter confirms poor mobility ad aware SNF recommended but patient unlikely to comply.  We will continue to work with patient.  I encouraged family to sit down and talk with patient about options.  Feel he is competent to refuse care Inpatient pending diuresis tele  Pleas KochJai Inella Kuwahara, MD  [facility] Pager [phone] 11/16/2016, 10:39 AM    LOS: 3 days

## 2016-11-16 NOTE — Progress Notes (Signed)
    Agree with current plan. No changes.   Donato SchultzMark Yazlynn Birkeland, MD

## 2016-11-16 NOTE — Progress Notes (Signed)
Patient repeatedly yelling help, when RN enters room patient states he needs to stand however patient refuses to help staff at all, says he will fall in the floor.  MD in room, MD, RN and student nurse attempted to assist patient to stand with walker, patient unable to stand.  MD spoke extensively with patient regarding how he functions at home.  Patient insistent that he has no problems functioning at home but cannot even move his own feet here in the hospital setting.  RN went to get gait belt and additional help to assist patient to stand.

## 2016-11-16 NOTE — Progress Notes (Addendum)
Patient took off rectal placed over his penis and threw on the ground. Patient states he prefers to urinate on the floor. Patient is requesting something stronger for pain. MD paged.Patient is condescending, threatening and disrespectful to staff.  Petra Kubarica Oris Calmes Rn 11/16/2016 4:22 AM

## 2016-11-17 MED ORDER — FUROSEMIDE 80 MG PO TABS
80.0000 mg | ORAL_TABLET | Freq: Two times a day (BID) | ORAL | Status: DC
  Administered 2016-11-17: 80 mg via ORAL
  Filled 2016-11-17: qty 1

## 2016-11-17 MED ORDER — POTASSIUM CHLORIDE CRYS ER 20 MEQ PO TBCR: 40.0000 meq | EXTENDED_RELEASE_TABLET | Freq: Every day | ORAL | 0 refills | Status: DC

## 2016-11-17 MED ORDER — DOXYCYCLINE HYCLATE 100 MG PO TABS: 100.0000 mg | ORAL_TABLET | Freq: Two times a day (BID) | ORAL | 0 refills | Status: DC

## 2016-11-17 NOTE — Progress Notes (Addendum)
Patient to be transported home via non emergent ambulance per daughter ( she is unable to get him into her car safely). [facility] called and they are planned to pick him up around 4 pm. Paperwork placed on front of shadow chart for ambulance transport.  B Naythen Heikkila RN,MHA,BSN  1226 pm Re[facility] called and stated that they do not service the [place] area; [facility]; awaiting callback; Alexis GoodellB Medhansh Brinkmeier RN,MHA,BSN [phone]  1310 pm [facility], they cannot accept patient due to [facility] was [facility]. CM called his daughter again for an update. She plans to come to the hospital to talk to her father about going to a SNF for rehab this pm. Lauretta GrillSarah Soc Worker called and updated. Ambulance transportation cancelled due to pending disposition at this time. Abelino DerrickB Espyn Radwan [facility]CRN,MHA,BSN [phone]  1426 pm Received call from daughter [name], she talked to her brother and both of them call the patient to discuss DCP; She stated that he would only yell at the two of them on the telephone stating that he was going home. [name] is in agreement for discharge home. [facility] ambulance called again for 4 pm pick up. Abelino DerrickB Eulon Allnutt RN,MHA,BSN

## 2016-11-17 NOTE — Progress Notes (Signed)
With patient approval, discharge education reviewed with daughter also.

## 2016-11-17 NOTE — Progress Notes (Signed)
Unable to obtain accurate weight this am. Pt. Hesitant to stand on scale with assistance and when up, rushing to sit back down in chair. Pt. Continues to urinate on the floor when staff encourages him to use urinal that is placed within reach.

## 2016-11-17 NOTE — Discharge Summary (Addendum)
Physician Discharge Summary  Jesus [name] [id number] DOB: 30-Mar-1946 DOA: 11/13/2016  PCP: [name],[name], [name]  Admit date: 11/13/2016 Discharge date: 11/17/2016  Time spent: 35 minutes  Recommendations for Outpatient Follow-up:  1. bmet in 1 week 2. Continue torsemide-to be guided by lab work and fluid status 3. Will need to complete doxycycline for RLE cellulitis on 11/23/16 4. D/c lisinopril this admit  Discharge Diagnoses:  Principal Problem:   Acute on chronic diastolic CHF (congestive heart failure) (HCC) Active Problems:   Atrial fibrillation (HCC)   CHF (congestive heart failure) (HCC)   Hypertension   Discharge Condition: poor  Diet recommendation:  Heart healthy  Filed Weights   11/15/16 0531 11/16/16 0406 11/17/16 0906  Weight: (!) 137 kg (302 lb 1.6 oz) (!) 141.4 kg (311 lb 11.2 oz) 128.1 kg (282 lb 6.4 oz)    History of present illness:  70 y/o ? Known chr Afib CHad2Vasc2 score=3 since 8/12/-3 Diastolic HF since 2003 Chr Hyponatremia Morbid obesity, Body mass index is 40.52 kg/m.  Anemia of chr disease Chr venous insuff Probable OSA   Admitted with vol overload   Hospital Course:    1. AECHF-Diastolic-diurese as per Cardiology lasix 80 iv TID, aldactone 25 qd.  Labs am.  Usual basleine weight 295-300.  Cannot get accurate weight or I/o as passing urine over floor.  weight was 282 which is the lowest documented wght we have seen him have 2. Cellulitis RLE-monitor feet.  Diurese. Elevate legs.  As had low grade ferver, start on doxycycline 100 po bid for 5d 11/15/16--end date 12/17 3. uncontrolled htn- added bidil tid 11/15/16-slightly improved control.  d/c ACE this admit, cont aldactone 25 od 4. Hypervolemic hyponatremia 2/2 to CHF-should improve with diuresis, 126-->130->128-->130 on d/c 5. Morbid obesity, Body mass index is 40.52 kg/m.-needs  Weight loss counselling 6. Chr Afib CHad2Vasc2 score=3 on eliquis-cont coreg 6.25 bid.  Rate  controlled 7. Likely Obstructive sleep apea-patient falls asleep while taking to this examiner.  " I sleep just fine".  Very unlikely patient will agree to OSA screening 8. Chr Venous insuff-see above 9. Poor social situation-S/w to look into home situation-found disheveled as well as in feces.  Might need SNF   Talked with daughter-patient resistant to moving around and cannot get up.  Daughter confirms poor mobility ad aware SNF recommended but patient unlikely to comply. he is competent to refuse care Will need ambulance transport home    Discharge Exam: Vitals:   11/16/16 2002 11/17/16 0355  BP: 90/77 102/61  Pulse: 76 73  Resp: 20 20  Temp: 97.9 F (36.6 C) 97.7 F (36.5 C)    General: alert sleepy oriented in nad Cardiovascular: s1 s 2no m/r/g Respiratory: clear no added sound LE swelling noted Some redness to feet  Discharge Instructions   Discharge Instructions    Diet - low sodium heart healthy    Complete by:  As directed    Discharge instructions    Complete by:  As directed    Needs Bmet in about 1 week after discharge Daily weight as needed Take your demadex as prn   Increase activity slowly    Complete by:  As directed      Current Discharge Medication List    CONTINUE these medications which have CHANGED   Details  doxycycline (VIBRA-TABS) 100 MG tablet Take 1 tablet (100 mg total) by mouth 2 (two) times daily. Qty: 12 tablet, Refills: 0    potassium chloride SA (K-DUR,KLOR-CON) 20 MEQ  tablet Take 2 tablets (40 mEq total) by mouth daily. Qty: 60 tablet, Refills: 0      CONTINUE these medications which have NOT CHANGED   Details  apixaban (ELIQUIS) 5 MG TABS tablet Take 1 tablet (5 mg total) by mouth 2 (two) times daily. Qty: 60 tablet, Refills: 0    atorvastatin (LIPITOR) 80 MG tablet Take 1 tablet (80 mg total) by mouth daily. Qty: 30 tablet, Refills: 0    carvedilol (COREG) 6.25 MG tablet Take 1 tablet (6.25 mg total) by mouth 2 (two)  times daily with a meal. Qty: 60 tablet, Refills: 0    pantoprazole (PROTONIX) 40 MG tablet Take 1 tablet (40 mg total) by mouth daily. Qty: 30 tablet, Refills: 0    spironolactone (ALDACTONE) 25 MG tablet Take 1 tablet (25 mg total) by mouth daily. Start 8/11 Qty: 30 tablet, Refills: 0    saccharomyces boulardii (FLORASTOR) 250 MG capsule Take 1 capsule (250 mg total) by mouth 2 (two) times daily. Qty: 60 capsule, Refills: 0    torsemide (DEMADEX) 20 MG tablet Take 3 tablets (60 mg total) by mouth 2 (two) times daily. Start 8/11 Qty: 180 tablet, Refills: 0      STOP taking these medications     furosemide (LASIX) 40 MG tablet      lisinopril (PRINIVIL,ZESTRIL) 10 MG tablet        Allergies  Allergen Reactions  . Amlodipine Besylate Swelling and Other (See Comments)    "started off low and ended up severe; if, in fact, that is what's causing the swelling" (06/03/12)   Follow-up Information    [name],[name], [name].   Specialty:  Physician Assistant Contact information: [address] [place] [address]            The results of significant diagnostics from this hospitalization (including imaging, microbiology, ancillary and laboratory) are listed below for reference.    Significant Diagnostic Studies: Dg Chest 2 View  Result Date: 11/13/2016 CLINICAL DATA:  Nonproductive congested cough, chronic. Fluid retention in the lower extremities. EXAM: CHEST  2 VIEW COMPARISON:  PA and lateral chest x-ray of July 08, 2015 FINDINGS: The lungs are adequately inflated. There is a small right pleural effusion. The interstitial markings of both lungs are increased. The pulmonary vascularity is engorged. The cardiac silhouette is enlarged. There is calcification in the wall of the aortic arch. The observed bony thorax exhibits no acute abnormality. IMPRESSION: CHF with mild pulmonary interstitial edema. Small right pleural effusion. Electronically Signed   By: David  SwazilandJordan M.D.    On: 11/13/2016 17:04    Microbiology: No results found for this or any previous visit (from the past 240 hour(s)).   Labs: Basic Metabolic Panel:  Recent Labs Lab 11/13/16 1435 11/14/16 0204 11/15/16 0702 11/16/16 0225 11/16/16 1050  NA 126* 130* 130* 128* 130*  K 3.9 3.5 4.1 4.8 4.7  CL 88* 87* 88* 88* 91*  CO2 30 30 32 30 29  GLUCOSE 103* 99 135* 104* 111*  BUN 11 10 24* 39* 44*  CREATININE 1.02 0.92 1.15 1.62* 1.83*  CALCIUM 8.9 9.0 8.8* 8.9 9.0  PHOS  --   --   --   --  3.5   Liver Function Tests:  Recent Labs Lab 11/16/16 1050  ALBUMIN 3.1*   No results for input(s): LIPASE, AMYLASE in the last 168 hours. No results for input(s): AMMONIA in the last 168 hours. CBC:  Recent Labs Lab 11/13/16 1435 11/14/16  0204  WBC 6.9 7.4  NEUTROABS  --  5.4  HGB 12.4* 12.5*  HCT 37.4* 37.9*  MCV 85.8 86.3  PLT 263 256   Cardiac Enzymes: No results for input(s): CKTOTAL, CKMB, CKMBINDEX, TROPONINI in the last 168 hours. BNP: BNP (last 3 results)  Recent Labs  11/13/16 1435  BNP 142.6*    ProBNP (last 3 results) No results for input(s): PROBNP in the last 8760 hours.  CBG: No results for input(s): GLUCAP in the last 168 hours.     SignedRhetta Mura MD   [facility] 11/17/2016, 11:44 AM

## 2016-11-17 NOTE — Care Management Note (Addendum)
Case Management Note  Patient Details  Name: Jesus [name] MRN: [id number] Date of Birth: 12/11/1945  Action/Plan: Physical Therapist / Attending MD recommends SNF placement, patient refused placement and request to return home at discharge. [facility] but per Jesus [name] with [facility], they cannot take the patient back due to unsafe living conditions. HHC choice offered to patient, he chose [facility]. Jesus [name] with [facility] called for arrangements. CM talked to patient's daughter [facility] regarding discharge plans and to verify home address for ambulance transportation home.  Expected Discharge Date:     11/17/2016             Expected Discharge Plan:  Home w Home Health Services  Discharge planning Services  CM Consult Choice offered to:  Patient  HH Arranged:  RN, PT, aide and SW [name]  [facility] Status of Service:  Completed, signed off  Reola MosherChandler, Tyjai Charbonnet L, RN,MHA,BSN [phone] 11/17/2016, 11:59 AM

## 2016-11-17 NOTE — Care Management Important Message (Signed)
Important Message  Patient Details  Name: Jesus [name] MRN: [id number] Date of Birth: [birth year]   Medicare Important Message Given:  Yes    Keegan Ducey Stefan ChurchBratton 11/17/2016, 11:25 AM

## 2016-11-17 NOTE — Progress Notes (Signed)
Patient has order to discharge to home. Discharge education reviewed and patient verbalizes understanding. IV removed and site intact. Telemetry removed.  All of patient's belongings packed. Patients wanted to keep phone, wallet, and check book in his hand.   Patient stable and awaiting transportation via [facility] to home.

## 2016-11-17 NOTE — Clinical Social Work Note (Addendum)
[facility], patient is refusing SNF. RNCM is working patient up for home health services.  CSW signing off. Consult again if any social work needs arise.  Charlynn CourtSarah Naria Abbey, CSW [phone]  1:22 pm Per RNCM, patient has not been accepted by any home health agency. Patient's daughter coming to hospital around 3:00 or 4:00 pm today to try and persuade her father to go to a SNF. CSW will wait on patient's daughter to talk with him before introducing the SNF option.  Charlynn CourtSarah Annia Gomm, CSW [phone]  4:26 pm Patient discharging home today.   CSW signing off.  Charlynn CourtSarah Rorie Delmore, CSW [phone]

## 2016-11-24 ENCOUNTER — Emergency Department (HOSPITAL_COMMUNITY): Payer: Commercial Managed Care - HMO

## 2016-11-24 ENCOUNTER — Inpatient Hospital Stay (HOSPITAL_COMMUNITY)
Admission: EM | Admit: 2016-11-24 | Discharge: 2016-11-29 | DRG: 602 | Disposition: A | Discharge status: Skilled Nursing Facility | Payer: Commercial Managed Care - HMO | Attending: Family Medicine | Admitting: Family Medicine

## 2016-11-24 ENCOUNTER — Encounter (HOSPITAL_COMMUNITY): Payer: Self-pay

## 2016-11-24 DIAGNOSIS — Z87891 Personal history of nicotine dependence: Secondary | ICD-10-CM

## 2016-11-24 DIAGNOSIS — Z6841 Body Mass Index (BMI) 40.0 and over, adult: Secondary | ICD-10-CM

## 2016-11-24 DIAGNOSIS — I70203 Unspecified atherosclerosis of native arteries of extremities, bilateral legs: Secondary | ICD-10-CM | POA: Diagnosis present

## 2016-11-24 DIAGNOSIS — D649 Anemia, unspecified: Secondary | ICD-10-CM | POA: Diagnosis not present

## 2016-11-24 DIAGNOSIS — I5033 Acute on chronic diastolic (congestive) heart failure: Secondary | ICD-10-CM | POA: Diagnosis present

## 2016-11-24 DIAGNOSIS — I130 Hypertensive heart and chronic kidney disease with heart failure and stage 1 through stage 4 chronic kidney disease, or unspecified chronic kidney disease: Secondary | ICD-10-CM | POA: Diagnosis not present

## 2016-11-24 DIAGNOSIS — N183 Chronic kidney disease, stage 3 (moderate): Secondary | ICD-10-CM | POA: Diagnosis present

## 2016-11-24 DIAGNOSIS — Z9114 Patient's other noncompliance with medication regimen: Secondary | ICD-10-CM | POA: Diagnosis not present

## 2016-11-24 DIAGNOSIS — I10 Essential (primary) hypertension: Secondary | ICD-10-CM | POA: Diagnosis not present

## 2016-11-24 DIAGNOSIS — I872 Venous insufficiency (chronic) (peripheral): Secondary | ICD-10-CM | POA: Diagnosis not present

## 2016-11-24 DIAGNOSIS — E871 Hypo-osmolality and hyponatremia: Secondary | ICD-10-CM | POA: Diagnosis present

## 2016-11-24 DIAGNOSIS — T500X6A Underdosing of mineralocorticoids and their antagonists, initial encounter: Secondary | ICD-10-CM | POA: Diagnosis present

## 2016-11-24 DIAGNOSIS — L03119 Cellulitis of unspecified part of limb: Secondary | ICD-10-CM

## 2016-11-24 DIAGNOSIS — L03115 Cellulitis of right lower limb: Principal | ICD-10-CM | POA: Diagnosis present

## 2016-11-24 DIAGNOSIS — E7800 Pure hypercholesterolemia, unspecified: Secondary | ICD-10-CM | POA: Diagnosis present

## 2016-11-24 DIAGNOSIS — R41841 Cognitive communication deficit: Secondary | ICD-10-CM | POA: Diagnosis not present

## 2016-11-24 DIAGNOSIS — R222 Localized swelling, mass and lump, trunk: Secondary | ICD-10-CM | POA: Diagnosis not present

## 2016-11-24 DIAGNOSIS — I4891 Unspecified atrial fibrillation: Secondary | ICD-10-CM | POA: Diagnosis present

## 2016-11-24 DIAGNOSIS — L03116 Cellulitis of left lower limb: Secondary | ICD-10-CM | POA: Diagnosis not present

## 2016-11-24 DIAGNOSIS — I482 Chronic atrial fibrillation: Secondary | ICD-10-CM | POA: Diagnosis present

## 2016-11-24 DIAGNOSIS — M6281 Muscle weakness (generalized): Secondary | ICD-10-CM | POA: Diagnosis not present

## 2016-11-24 DIAGNOSIS — I739 Peripheral vascular disease, unspecified: Secondary | ICD-10-CM | POA: Diagnosis not present

## 2016-11-24 DIAGNOSIS — N179 Acute kidney failure, unspecified: Secondary | ICD-10-CM | POA: Diagnosis present

## 2016-11-24 DIAGNOSIS — Z9111 Patient's noncompliance with dietary regimen: Secondary | ICD-10-CM | POA: Diagnosis not present

## 2016-11-24 DIAGNOSIS — Z7901 Long term (current) use of anticoagulants: Secondary | ICD-10-CM

## 2016-11-24 DIAGNOSIS — B999 Unspecified infectious disease: Secondary | ICD-10-CM | POA: Diagnosis not present

## 2016-11-24 DIAGNOSIS — R600 Localized edema: Secondary | ICD-10-CM | POA: Diagnosis not present

## 2016-11-24 DIAGNOSIS — I480 Paroxysmal atrial fibrillation: Secondary | ICD-10-CM | POA: Diagnosis not present

## 2016-11-24 DIAGNOSIS — Z888 Allergy status to other drugs, medicaments and biological substances status: Secondary | ICD-10-CM

## 2016-11-24 DIAGNOSIS — R609 Edema, unspecified: Secondary | ICD-10-CM

## 2016-11-24 DIAGNOSIS — M7989 Other specified soft tissue disorders: Secondary | ICD-10-CM | POA: Diagnosis present

## 2016-11-24 DIAGNOSIS — L97811 Non-pressure chronic ulcer of other part of right lower leg limited to breakdown of skin: Secondary | ICD-10-CM | POA: Diagnosis present

## 2016-11-24 DIAGNOSIS — M79606 Pain in leg, unspecified: Secondary | ICD-10-CM | POA: Diagnosis not present

## 2016-11-24 DIAGNOSIS — R278 Other lack of coordination: Secondary | ICD-10-CM | POA: Diagnosis not present

## 2016-11-24 HISTORY — DX: Cutaneous abscess of limb, unspecified: L02.419

## 2016-11-24 HISTORY — DX: Cellulitis of unspecified part of limb: L03.119

## 2016-11-24 LAB — COMPREHENSIVE METABOLIC PANEL
ALT: 19 U/L (ref 17–63)
AST: 23 U/L (ref 15–41)
Albumin: 3.2 g/dL — ABNORMAL LOW (ref 3.5–5.0)
Alkaline Phosphatase: 181 U/L — ABNORMAL HIGH (ref 38–126)
Anion gap: 10 (ref 5–15)
BUN: 34 mg/dL — AB (ref 6–20)
CHLORIDE: 92 mmol/L — AB (ref 101–111)
CO2: 28 mmol/L (ref 22–32)
CREATININE: 1.3 mg/dL — AB (ref 0.61–1.24)
Calcium: 9.2 mg/dL (ref 8.9–10.3)
GFR calc Af Amer: >60 mL/min (ref >60)
GFR, EST NON AFRICAN AMERICAN: 54 mL/min — AB (ref >60)
Glucose, Bld: 104 mg/dL — ABNORMAL HIGH (ref 65–99)
Potassium: 4.3 mmol/L (ref 3.5–5.1)
SODIUM: 130 mmol/L — AB (ref 135–145)
Total Bilirubin: 1.4 mg/dL — ABNORMAL HIGH (ref 0.3–1.2)
Total Protein: 7.1 g/dL (ref 6.5–8.1)

## 2016-11-24 LAB — CBC WITH DIFFERENTIAL/PLATELET
Basophils Absolute: 0 10*3/uL (ref 0.0–0.1)
Basophils Relative: 0 %
EOS ABS: 0.2 10*3/uL (ref 0.0–0.7)
EOS PCT: 3 %
HCT: 36.7 % — ABNORMAL LOW (ref 39.0–52.0)
Hemoglobin: 11.8 g/dL — ABNORMAL LOW (ref 13.0–17.0)
LYMPHS ABS: 0.5 10*3/uL — AB (ref 0.7–4.0)
Lymphocytes Relative: 5 %
MCH: 27.8 pg (ref 26.0–34.0)
MCHC: 32.2 g/dL (ref 30.0–36.0)
MCV: 86.6 fL (ref 78.0–100.0)
MONO ABS: 1 10*3/uL (ref 0.1–1.0)
MONOS PCT: 10 %
Neutro Abs: 7.8 10*3/uL — ABNORMAL HIGH (ref 1.7–7.7)
Neutrophils Relative %: 82 %
PLATELETS: 360 10*3/uL (ref 150–400)
RBC: 4.24 MIL/uL (ref 4.22–5.81)
RDW: 14.2 % (ref 11.5–15.5)
WBC: 9.6 10*3/uL (ref 4.0–10.5)

## 2016-11-24 LAB — I-STAT CG4 LACTIC ACID, ED: Lactic Acid, Venous: 1.33 mmol/L (ref 0.5–1.9)

## 2016-11-24 LAB — BRAIN NATRIURETIC PEPTIDE: B NATRIURETIC PEPTIDE 5: 247.1 pg/mL — AB (ref 0.0–100.0)

## 2016-11-24 MED ORDER — PIPERACILLIN-TAZOBACTAM 3.375 G IVPB 30 MIN
3.3750 g | Freq: Once | INTRAVENOUS | Status: AC
  Administered 2016-11-24: 3.375 g via INTRAVENOUS
  Filled 2016-11-24: qty 50

## 2016-11-24 MED ORDER — ATORVASTATIN CALCIUM 80 MG PO TABS
80.0000 mg | ORAL_TABLET | Freq: Every day | ORAL | Status: DC
  Administered 2016-11-24 – 2016-11-28 (×5): 80 mg via ORAL
  Filled 2016-11-24 (×5): qty 1

## 2016-11-24 MED ORDER — ONDANSETRON HCL 4 MG PO TABS: 4.0000 mg | ORAL_TABLET | Freq: Four times a day (QID) | ORAL | Status: DC | PRN

## 2016-11-24 MED ORDER — APIXABAN 5 MG PO TABS
5.0000 mg | ORAL_TABLET | Freq: Two times a day (BID) | ORAL | Status: DC
  Administered 2016-11-24 – 2016-11-29 (×10): 5 mg via ORAL
  Filled 2016-11-24 (×10): qty 1

## 2016-11-24 MED ORDER — VANCOMYCIN HCL IN DEXTROSE 1-5 GM/200ML-% IV SOLN
1000.0000 mg | Freq: Once | INTRAVENOUS | Status: AC
  Administered 2016-11-24: 1000 mg via INTRAVENOUS
  Filled 2016-11-24: qty 200

## 2016-11-24 MED ORDER — VANCOMYCIN HCL 10 G IV SOLR
1500.0000 mg | INTRAVENOUS | Status: AC
  Administered 2016-11-24: 1500 mg via INTRAVENOUS
  Filled 2016-11-24: qty 1500

## 2016-11-24 MED ORDER — SODIUM CHLORIDE 0.9 % IV SOLN: 250.0000 mL | INTRAVENOUS | Status: DC | PRN

## 2016-11-24 MED ORDER — ACETAMINOPHEN 650 MG RE SUPP: 650.0000 mg | Freq: Four times a day (QID) | RECTAL | Status: DC | PRN

## 2016-11-24 MED ORDER — TORSEMIDE 20 MG PO TABS: 60.0000 mg | ORAL_TABLET | Freq: Two times a day (BID) | ORAL | Status: DC

## 2016-11-24 MED ORDER — FUROSEMIDE 10 MG/ML IJ SOLN
60.0000 mg | Freq: Two times a day (BID) | INTRAMUSCULAR | Status: DC
  Administered 2016-11-24 – 2016-11-25 (×2): 60 mg via INTRAVENOUS
  Filled 2016-11-24: qty 6

## 2016-11-24 MED ORDER — VANCOMYCIN HCL 10 G IV SOLR
1250.0000 mg | INTRAVENOUS | Status: DC
  Administered 2016-11-25: 1250 mg via INTRAVENOUS
  Filled 2016-11-24 (×2): qty 1250

## 2016-11-24 MED ORDER — CARVEDILOL 6.25 MG PO TABS
6.2500 mg | ORAL_TABLET | Freq: Two times a day (BID) | ORAL | Status: DC
  Administered 2016-11-24 – 2016-11-29 (×10): 6.25 mg via ORAL
  Filled 2016-11-24 (×10): qty 1

## 2016-11-24 MED ORDER — SPIRONOLACTONE 25 MG PO TABS
25.0000 mg | ORAL_TABLET | Freq: Every day | ORAL | Status: DC
  Administered 2016-11-24 – 2016-11-29 (×6): 25 mg via ORAL
  Filled 2016-11-24 (×6): qty 1

## 2016-11-24 MED ORDER — ACETAMINOPHEN 325 MG PO TABS: 650.0000 mg | ORAL_TABLET | Freq: Four times a day (QID) | ORAL | Status: DC | PRN

## 2016-11-24 MED ORDER — SODIUM CHLORIDE 0.9% FLUSH
3.0000 mL | Freq: Two times a day (BID) | INTRAVENOUS | Status: DC
  Administered 2016-11-24 – 2016-11-28 (×6): 3 mL via INTRAVENOUS

## 2016-11-24 MED ORDER — ONDANSETRON HCL 4 MG/2ML IJ SOLN: 4.0000 mg | Freq: Four times a day (QID) | INTRAMUSCULAR | Status: DC | PRN

## 2016-11-24 MED ORDER — ENOXAPARIN SODIUM 40 MG/0.4ML ~~LOC~~ SOLN: 40.0000 mg | SUBCUTANEOUS | Status: DC

## 2016-11-24 MED ORDER — HYDROMORPHONE HCL 2 MG/ML IJ SOLN
1.0000 mg | Freq: Once | INTRAMUSCULAR | Status: AC
  Administered 2016-11-24: 1 mg via INTRAVENOUS
  Filled 2016-11-24: qty 1

## 2016-11-24 MED ORDER — HYDROCODONE-ACETAMINOPHEN 5-325 MG PO TABS
1.0000 | ORAL_TABLET | ORAL | Status: DC | PRN
  Administered 2016-11-24 – 2016-11-28 (×10): 2 via ORAL
  Filled 2016-11-24 (×10): qty 2

## 2016-11-24 MED ORDER — PANTOPRAZOLE SODIUM 40 MG PO TBEC
40.0000 mg | DELAYED_RELEASE_TABLET | Freq: Two times a day (BID) | ORAL | Status: DC
  Administered 2016-11-24 – 2016-11-28 (×9): 40 mg via ORAL
  Filled 2016-11-24 (×11): qty 1

## 2016-11-24 MED ORDER — PIPERACILLIN-TAZOBACTAM 3.375 G IVPB
3.3750 g | Freq: Three times a day (TID) | INTRAVENOUS | Status: DC
  Administered 2016-11-24 – 2016-11-28 (×11): 3.375 g via INTRAVENOUS
  Filled 2016-11-24 (×12): qty 50

## 2016-11-24 NOTE — ED Provider Notes (Signed)
[facility]-EMERGENCY DEPT Provider Note   CSN: [id number] Arrival date & time: 11/24/16  1007     History   Chief Complaint Chief Complaint  Patient presents with  . Leg Pain  . Leg Swelling    HPI Jesus [name] is a 70 y.o. male.  HPI   Patient with hx afib, diastolic CHF, HTN, HLD, morbid obesity, admitted 11/13/16-11/17/16 for acute on chronic heart failure causing lower extremity edema, also RLE cellulitis.  Pt treated inpatient with IV lasix 80mg  TID and doxycycline, d/c home on doxycycline through 11/23/16.  Baseline weight is 295-300.    Level V caveat - pt is an extremely poor historian.  Cannot tell me who called 911 today or the specific reason for his coming to the ED.    Past Medical History:  Diagnosis Date  . Arthritis    "normal for my age" (11/13/2016)  . Chronic atrial fibrillation (HCC)   . Chronic diastolic CHF (congestive heart failure) (HCC) 12/2002   a. EF 50-55% by echo 04/2012  . Edema of lower extremity   . Gout   . H/O: GI bleed    "cause I took too much aspirin"  . High cholesterol   . Hyperglycemia    Noted 05/2012  . Hypertension   . Morbid obesity (HCC)   . Venous stasis ulcer (HCC)     Patient Active Problem List   Diagnosis Date Noted  . Hyponatremia 07/15/2015  . Cellulitis of right lower extremity 07/15/2015  . Acute on chronic renal failure (HCC)   . Hypokalemia   . Chronic atrial fibrillation (HCC) 07/09/2015  . Chronic anemia 07/09/2015  . CHF exacerbation (HCC) 07/08/2015  . Chronic diastolic CHF (congestive heart failure) (HCC) 04/02/2015  . Chronic diastolic heart failure (HCC) 01/25/2015  . Cellulitis 01/17/2015  . Pulmonary vascular congestion   . Congestive heart disease (HCC)   . Peripheral edema   . Acute exacerbation of CHF (congestive heart failure) (HCC) 01/04/2015  . 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 PAOD (peripheral arterial occlusive disease) (HCC)  02/17/2014  . Renal artery stenosis (HCC) 02/17/2014  . Peripheral vascular disease (HCC) 04/15/2013  . Chronic venous insufficiency 04/15/2013  . Morbid obesity (HCC) 06/11/2012  . Acute on chronic diastolic CHF (congestive heart failure) (HCC) 06/02/2012  . Unspecified essential hypertension 05/18/2012  . Atrial fibrillation (HCC) 04/27/2012  . CHF (congestive heart failure) (HCC) 04/27/2012  . Hypertension 04/27/2012    Past Surgical History:  Procedure Laterality Date  . NO PAST SURGERIES         Home Medications    Prior to Admission medications   Medication Sig Start Date End Date Taking? Authorizing Provider  apixaban (ELIQUIS) 5 MG TABS tablet Take 1 tablet (5 mg total) by mouth 2 (two) times daily. 07/18/15  Yes Shanker Levora DredgeM Ghimire, MD  atorvastatin (LIPITOR) 80 MG tablet Take 1 tablet (80 mg total) by mouth daily. 07/18/15  Yes Shanker Levora DredgeM Ghimire, MD  carvedilol (COREG) 6.25 MG tablet Take 1 tablet (6.25 mg total) by mouth 2 (two) times daily with a meal. 07/18/15  Yes Shanker Levora DredgeM Ghimire, MD  doxycycline (VIBRA-TABS) 100 MG tablet Take 1 tablet (100 mg total) by mouth 2 (two) times daily. 11/17/16  Yes Rhetta MuraJai-Gurmukh Samtani, MD  pantoprazole (PROTONIX) 40 MG tablet Take 1 tablet (40 mg total) by mouth daily. Patient taking differently: Take 40 mg by mouth 2 (two) times daily.  07/18/15  Yes Shanker Levora DredgeM Ghimire,  MD  potassium chloride SA (K-DUR,KLOR-CON) 20 MEQ tablet Take 2 tablets (40 mEq total) by mouth daily. 11/17/16  Yes Rhetta Mura, MD  saccharomyces boulardii (FLORASTOR) 250 MG capsule Take 1 capsule (250 mg total) by mouth 2 (two) times daily. 07/18/15  Yes Shanker Levora Dredge, MD  spironolactone (ALDACTONE) 25 MG tablet Take 1 tablet (25 mg total) by mouth daily. Start 8/11 07/19/15  Yes Shanker Levora Dredge, MD    Family History Family History  Problem Relation Age of Onset  . Arrhythmia Father   . Hypertension Father   . Cancer Mother     cervical    Social  History Social History  Substance Use Topics  . Smoking status: Former Smoker    Types: Cigarettes    Quit date: 12/08/1965  . Smokeless tobacco: Never Used     Comment: "social cigarette smoker in my late teens"  . Alcohol use 8.4 oz/week    14 Cans of beer per week     Allergies   Amlodipine besylate   Review of Systems Review of Systems  Unable to perform ROS: Other     Physical Exam Updated Vital Signs BP 125/80 (BP Location: Left Arm)   Pulse 68   Temp 98.9 F (37.2 C) (Oral)   Resp (!) 24   Ht 5\' 10"  (1.778 m)   Wt 60.6 kg   SpO2 95%   BMI 19.18 kg/m   Physical Exam  Constitutional: He appears well-developed and well-nourished. No distress.  Morbidly obese.  Malodorous, unkempt.  Clothes are soiled, lower extremity wounds caked in dirt.  Weight is 140.6kg, 309.9 lbs.    HENT:  Head: Normocephalic and atraumatic.  Neck: Neck supple.  Cardiovascular: Normal rate and regular rhythm.   Pulmonary/Chest: Effort normal and breath sounds normal. No respiratory distress. He has no wheezes. He has no rales.  Abdominal: Soft. He exhibits no distension and no mass. There is no tenderness. There is no rebound and no guarding.  Neurological: He is alert. He exhibits normal muscle tone.  Skin: He is not diaphoretic.  Nursing note and vitals reviewed.                ED Treatments / Results  Labs (all labs ordered are listed, but only abnormal results are displayed) Labs Reviewed  COMPREHENSIVE METABOLIC PANEL - Abnormal; Notable for the following:       Result Value   Sodium 130 (*)    Chloride 92 (*)    Glucose, Bld 104 (*)    BUN 34 (*)    Creatinine, Ser 1.30 (*)    Albumin 3.2 (*)    Alkaline Phosphatase 181 (*)    Total Bilirubin 1.4 (*)    GFR calc non Af Amer 54 (*)    All other components within normal limits  CBC WITH DIFFERENTIAL/PLATELET - Abnormal; Notable for the following:    Hemoglobin 11.8 (*)    HCT 36.7 (*)    Neutro Abs 7.8  (*)    Lymphs Abs 0.5 (*)    All other components within normal limits  BRAIN NATRIURETIC PEPTIDE - Abnormal; Notable for the following:    B Natriuretic Peptide 247.1 (*)    All other components within normal limits  CULTURE, BLOOD (ROUTINE X 2)  CULTURE, BLOOD (ROUTINE X 2)  BASIC METABOLIC PANEL  CBC  I-STAT CG4 LACTIC ACID, ED  I-STAT CG4 LACTIC ACID, ED    EKG  EKG Interpretation None  Radiology No results found.  Procedures Procedures (including critical care time)  Medications Ordered in ED Medications  piperacillin-tazobactam (ZOSYN) IVPB 3.375 g (not administered)  sodium chloride flush (NS) 0.9 % injection 3 mL (not administered)  0.9 %  sodium chloride infusion (not administered)  acetaminophen (TYLENOL) tablet 650 mg (not administered)    Or  acetaminophen (TYLENOL) suppository 650 mg (not administered)  HYDROcodone-acetaminophen (NORCO/VICODIN) 5-325 MG per tablet 1-2 tablet (2 tablets Oral Given 11/24/16 1715)  ondansetron (ZOFRAN) tablet 4 mg (not administered)    Or  ondansetron (ZOFRAN) injection 4 mg (not administered)  apixaban (ELIQUIS) tablet 5 mg (not administered)  atorvastatin (LIPITOR) tablet 80 mg (80 mg Oral Given 11/24/16 1714)  carvedilol (COREG) tablet 6.25 mg (6.25 mg Oral Given 11/24/16 1715)  pantoprazole (PROTONIX) EC tablet 40 mg (not administered)  spironolactone (ALDACTONE) tablet 25 mg (25 mg Oral Given 11/24/16 1715)  vancomycin (VANCOCIN) 1,500 mg in sodium chloride 0.9 % 500 mL IVPB (1,500 mg Intravenous Given 11/24/16 1635)  vancomycin (VANCOCIN) 1,250 mg in sodium chloride 0.9 % 250 mL IVPB (not administered)  furosemide (LASIX) injection 60 mg (60 mg Intravenous Given 11/24/16 1713)  vancomycin (VANCOCIN) IVPB 1000 mg/200 mL premix (1,000 mg Intravenous Transfusing/Transfer 11/24/16 1441)  HYDROmorphone (DILAUDID) injection 1 mg (1 mg Intravenous Given 11/24/16 1314)  piperacillin-tazobactam (ZOSYN) IVPB 3.375 g (0 g  Intravenous Stopped 11/24/16 1424)     Initial Impression / Assessment and Plan / ED Course  I have reviewed the triage vital signs and the nursing notes.  Pertinent labs & imaging results that were available during my care of the patient were reviewed by me and considered in my medical decision making (see chart for details).  Clinical Course    Pt with HTN, diastolic heart failure, afib, recent admission for heart failure with extremity edema and cellulitis.  Pt's home assessed and found to be very unclean.  Pt offered SNF upon discharge on 11/17/16 but he refused, returns today requesting placement.  Unclear if taking medications as daughter organizes his medications.  He is a very poor historian.  Vanc and Zosyn started for continued cellulitis of bilateral lower extremities despite outpatient treatment.  Wound consultation also requested as wounds are heavily soiled and thick with dead skin.  Pt also seen by Dr Erma HeritageIsaacs.  Admitted to [facility],  APP Toya SmothersKaren Black and Dr Konrad DoloresMerrell accepting.    Final Clinical Impressions(s) / ED Diagnoses   Final diagnoses:  Cellulitis of lower extremity, unspecified laterality  Peripheral edema    New Prescriptions Current Discharge Medication List       Trixie Dredgemily Giannie Soliday, [name] 11/24/16 1721    Shaune Pollackameron Isaacs, MD 11/26/16 2057

## 2016-11-24 NOTE — ED Notes (Signed)
Await call that bed is ready, report was given a while back

## 2016-11-24 NOTE — ED Notes (Signed)
Call from radiology regarding pt.  Rad tech tells me pt is questioning cxr.  Advised that MD would like a baseline to make full diagnosis of patient.  Pt refuses CXR and will be returned to room.  Will notify provider

## 2016-11-24 NOTE — ED Notes (Signed)
Ortho here to apply unna boot

## 2016-11-24 NOTE — Progress Notes (Addendum)
Pharmacy Antibiotic Note  Jesus [name] is a 70 y.o. male admitted on 11/24/2016 with cellulitis/wound infection.  Pharmacy has been consulted for zosyn/vancomycin dosing. Afebrile, WBC wnl, SCr 1.3 on admit, normalized CrCl~53.  Vancomycin 1g IV x 1 given in the ED at 1316.  Plan: Give additional vancomycin 1500mg  IV x1 (total 2500mg  load); then 1250mg  IV q24h Zosyn 3.375g IV (30min inf) x1; then 3.375g IV q8h (4h inf) Monitor clinical progress, c/s, renal function, abx plan/LOT Vancomycin levels as indicated   Height: 5\' 10"  (177.8 cm) Weight: (!) 310 lb (140.6 kg) IBW/kg (Calculated) : 73  Temp (24hrs), Avg:98.9 F (37.2 C), Min:98.9 F (37.2 C), Max:98.9 F (37.2 C)   Recent Labs Lab 11/24/16 1110 11/24/16 1156  WBC 9.6  --   CREATININE 1.30*  --   LATICACIDVEN  --  1.33    Estimated Creatinine Clearance: 74.8 mL/min (by C-G formula based on SCr of 1.3 mg/dL (H)).    Allergies  Allergen Reactions  . Amlodipine Besylate Swelling and Other (See Comments)    "started off low and ended up severe; if, in fact, that is what's causing the swelling" (06/03/12)    Babs BertinHaley Amzie Sillas, PharmD, BCPS Clinical Pharmacist 11/24/2016 12:42 PM

## 2016-11-24 NOTE — ED Notes (Signed)
Pt refused chest xray. Dr. Penne LashIssacs aware.

## 2016-11-24 NOTE — ED Notes (Signed)
Pt needs 1500mg  Vanc after the 1g of vanc has infused to make a total initial dose of 2500mg  vanc today.  Additional bag hung on IV pole and sent up with pt.  RN notified

## 2016-11-24 NOTE — Consult Note (Addendum)
WOC Nurse wound consult note Reason for Consult: Consult requested for bilat legs. Pt is [facility] to [facility] from recent admission; refer to previous consult note on 12/8. Pt was not wearing Una boots when admitted and is unable to state if home health came to his house.  He becomes very agitated and upset when asked questions. Legs have declined in appearance since previous consult. Wound type: Left leg without open wounds, odor, or drainage, generalized edema, dry cracked skin. Left toes have bright red, macerated, partial thickness skin loss where they previously had dry scabbed which lifted off easily. Right leg with cellulitis and generalized edema and erythremia; bright red, weeping, patchy areas of partial thickness skin loss extend from toes to below knee. Large amt yellow drainage with strong odor. Toes on bilat feet are macerated and crusted with dry scabs from previous drainage. Dressing procedure/placement/frequency: Paged ortho tech to apply bilat Una boots and coban and change Q Mon and Thurs.  Pt should resume followup with home health after discharge for continued compression wrap changes. Please re-consult if further assistance is needed. Thank-you,  Cammie Mcgeeawn Maury Bamba MSN, RN, CWOCN, [place]-AP, CNS [phone]

## 2016-11-24 NOTE — Progress Notes (Signed)
Orthopedic Tech Progress Note Patient Details:  Jesus [name] [birth year] [id number]  Ortho Devices Type of Ortho Device: Roland RackUnna boot Ortho Device/Splint Location: bilateral Ortho Device/Splint Interventions: Application   [name], Herve Haug 11/24/2016, 2:19 PM

## 2016-11-24 NOTE — H&P (Signed)
History and Physical    Lafayette DragonDouglas M Akhtar WGN:[id number] DOB: 19-Oct-1946 DOA: 11/24/2016  PCP: [name],[name], [name] Patient coming from: home  Chief Complaint: pain bilateral LE especially right  HPI: [name] M Burgueno is a 70 y.o. male with medical history significant for chronic congestive heart failure, hypertension, chronic A. fib, chronic lower extremity edema/wound, peripheral vascular disease venous stasis ulcers presents to the emergency Department chief complaint of worsening pain bilateral lower extremity particular around the right. Initial evaluation reveals persistent and worsening lower extremity edema and erythema concerning for cellulitis as well as on elevated BNP, acute on chronic kidney disease.  Information is obtained from the chart and the patient however the patient is a very historian. He was discharged on the hospital 7 days ago after a 4 day admission for the same. He was discharged on doxycycline is unsure if he completed the antibiotic. He denies fever chills worsening shortness of breath. He states he stays "in a recliner". He denies dysuria hematuria frequency or urgency. He denies abdominal pain nausea vomiting diarrhea constipation melena.    ED Course: In the emergency department is afebrile hemodynamically stable and not hypoxic.  Review of Systems: As per HPI otherwise 10 point review of systems negative.   Ambulatory Status: Ambulates with a walker and/or cane depending on the terrain. He states he is mostly stays in his recliner at home. He lives alone. Her meds and he requires some assistance with ADLs since it appears he hasn't engaged in any activities of daily living  Past Medical History:  Diagnosis Date  . Arthritis    "normal for my age" (11/13/2016)  . Chronic atrial fibrillation (HCC)   . Chronic diastolic CHF (congestive heart failure) (HCC) 12/2002   a. EF 50-55% by echo 04/2012  . Edema of lower extremity   . Gout   . H/O: GI bleed    "cause I took too much aspirin"  . High cholesterol   . Hyperglycemia    Noted 05/2012  . Hypertension   . Morbid obesity (HCC)   . Venous stasis ulcer (HCC)     Past Surgical History:  Procedure Laterality Date  . NO PAST SURGERIES      Social History   Social History  . Marital status: Divorced    Spouse name: N/A  . Number of children: N/A  . Years of education: N/A   Occupational History  . Retired    Social History Main Topics  . Smoking status: Former Smoker    Types: Cigarettes    Quit date: 12/08/1965  . Smokeless tobacco: Never Used     Comment: "social cigarette smoker in my late teens"  . Alcohol use 8.4 oz/week    14 Cans of beer per week  . Drug use: No  . Sexual activity: No   Other Topics Concern  . Not on file   Social History Narrative   Patient lives alone.    Allergies  Allergen Reactions  . Amlodipine Besylate Swelling and Other (See Comments)    "started off low and ended up severe; if, in fact, that is what's causing the swelling" (06/03/12)    Family History  Problem Relation Age of Onset  . Arrhythmia Father   . Hypertension Father   . Cancer Mother     cervical    Prior to Admission medications   Medication Sig Start Date End Date Taking? Authorizing Provider  apixaban (ELIQUIS) 5 MG TABS tablet Take 1 tablet (5 mg total) by  mouth 2 (two) times daily. 07/18/15  Yes Shanker Levora DredgeM Ghimire, MD  atorvastatin (LIPITOR) 80 MG tablet Take 1 tablet (80 mg total) by mouth daily. 07/18/15  Yes Shanker Levora DredgeM Ghimire, MD  carvedilol (COREG) 6.25 MG tablet Take 1 tablet (6.25 mg total) by mouth 2 (two) times daily with a meal. 07/18/15  Yes Shanker Levora DredgeM Ghimire, MD  doxycycline (VIBRA-TABS) 100 MG tablet Take 1 tablet (100 mg total) by mouth 2 (two) times daily. 11/17/16  Yes Rhetta MuraJai-Gurmukh Samtani, MD  pantoprazole (PROTONIX) 40 MG tablet Take 1 tablet (40 mg total) by mouth daily. Patient taking differently: Take 40 mg by mouth 2 (two) times daily.  07/18/15   Yes Shanker Levora DredgeM Ghimire, MD  potassium chloride SA (K-DUR,KLOR-CON) 20 MEQ tablet Take 2 tablets (40 mEq total) by mouth daily. 11/17/16  Yes Rhetta MuraJai-Gurmukh Samtani, MD  saccharomyces boulardii (FLORASTOR) 250 MG capsule Take 1 capsule (250 mg total) by mouth 2 (two) times daily. 07/18/15  Yes Shanker Levora DredgeM Ghimire, MD  spironolactone (ALDACTONE) 25 MG tablet Take 1 tablet (25 mg total) by mouth daily. Start 8/11 07/19/15  Yes Shanker Levora DredgeM Ghimire, MD    Physical Exam: Vitals:   11/24/16 1122 11/24/16 1300 11/24/16 1315 11/24/16 1355  BP: 127/85 145/76 120/73 120/73  Pulse: 66  70 80  Resp: 20   22  Temp:      TempSrc:      SpO2: 99% 99% 98% 95%  Weight:      Height:         General:  Appears Slightly irritable very unkept not uncomfortable Eyes:  PERRL, EOMI, normal lids, iris ENT:  grossly normal hearing, lips & tongue, mucous membranes of his mouth are pink slightly dry Neck:  no LAD, masses or thyromegaly Cardiovascular:  RRR, no m/r/g. Lower extremities with signs of chronic venous insufficiency skin very dry. Left toe with right red macerated skin loss right leg with cellulitis and edema and erythema some warmth Respiratory:  CTA bilaterally, no w/r/r. Normal respiratory effort. Abdomen:  soft, ntnd, NABS Skin:  no rash or induration seen on limited exam Musculoskeletal:  grossly normal tone BUE/BLE, good ROM, no bony abnormality Psychiatric:  grossly normal mood and affect, speech fluent and appropriate, AOx3 Neurologic:  CN 2-12 grossly intact, moves all extremities in coordinated fashion, sensation intact  Labs on Admission: I have personally reviewed following labs and imaging studies  CBC:  Recent Labs Lab 11/24/16 1110  WBC 9.6  NEUTROABS 7.8*  HGB 11.8*  HCT 36.7*  MCV 86.6  PLT 360   Basic Metabolic Panel:  Recent Labs Lab 11/24/16 1110  NA 130*  K 4.3  CL 92*  CO2 28  GLUCOSE 104*  BUN 34*  CREATININE 1.30*  CALCIUM 9.2   GFR: Estimated Creatinine  Clearance: 74.8 mL/min (by C-G formula based on SCr of 1.3 mg/dL (H)). Liver Function Tests:  Recent Labs Lab 11/24/16 1110  AST 23  ALT 19  ALKPHOS 181*  BILITOT 1.4*  PROT 7.1  ALBUMIN 3.2*   No results for input(s): LIPASE, AMYLASE in the last 168 hours. No results for input(s): AMMONIA in the last 168 hours. Coagulation Profile: No results for input(s): INR, PROTIME in the last 168 hours. Cardiac Enzymes: No results for input(s): CKTOTAL, CKMB, CKMBINDEX, TROPONINI in the last 168 hours. BNP (last 3 results) No results for input(s): PROBNP in the last 8760 hours. HbA1C: No results for input(s): HGBA1C in the last 72 hours. CBG: No results for input(s): GLUCAP  in the last 168 hours. Lipid Profile: No results for input(s): CHOL, HDL, LDLCALC, TRIG, CHOLHDL, LDLDIRECT in the last 72 hours. Thyroid Function Tests: No results for input(s): TSH, T4TOTAL, FREET4, T3FREE, THYROIDAB in the last 72 hours. Anemia Panel: No results for input(s): VITAMINB12, FOLATE, FERRITIN, TIBC, IRON, RETICCTPCT in the last 72 hours. Urine analysis:    Component Value Date/Time   COLORURINE YELLOW 06/02/2012 1200   APPEARANCEUR CLEAR 06/02/2012 1200   LABSPEC 1.010 06/02/2012 1200   PHURINE 6.5 06/02/2012 1200   GLUCOSEU NEGATIVE 06/02/2012 1200   HGBUR NEGATIVE 06/02/2012 1200   BILIRUBINUR NEGATIVE 06/02/2012 1200   KETONESUR NEGATIVE 06/02/2012 1200   PROTEINUR NEGATIVE 06/02/2012 1200   UROBILINOGEN 4.0 (H) 06/02/2012 1200   NITRITE NEGATIVE 06/02/2012 1200   LEUKOCYTESUR NEGATIVE 06/02/2012 1200    Creatinine Clearance: Estimated Creatinine Clearance: 74.8 mL/min (by C-G formula based on SCr of 1.3 mg/dL (H)).  Sepsis Labs: @LABRCNTIP (procalcitonin:4,lacticidven:4) ) Recent Results (from the past 240 hour(s))  Blood culture (routine x 2)     Status: None (Preliminary result)   Collection Time: 11/24/16 11:20 AM  Result Value Ref Range Status   Specimen Description BLOOD LEFT  HAND  Final   Special Requests BOTTLES DRAWN AEROBIC ONLY  10CC  Final   Culture PENDING  Incomplete   Report Status PENDING  Incomplete     Radiological Exams on Admission: No results found.  EKG:   Assessment/Plan Principal Problem:   Cellulitis Active Problems:   Atrial fibrillation (HCC)   Hypertension   Acute on chronic diastolic CHF (congestive heart failure) (HCC)   Morbid obesity (HCC)   Peripheral vascular disease (HCC)   Chronic venous insufficiency   Chronic anemia   Acute on chronic renal failure (HCC)   Hyponatremia   #1. Chronic bilateral lower extremity wounds related to cellulitis and/or venous stasis in the setting of acute on chronic diastolic heart failure Evaluated by wound team who opined both legs are worse since his discharge 6 days ago. Discharged with doxycycline unsure if he finished Patient reports increased pain. Patient is afebrile nontoxic appearing lactic acid within the limits of normal. BNP 247. Chest x-ray effused by patient -Admit -Follow blood cultures -Continue vanc and Zosyn -IV Lasix 60 mg twice a day -Unna boots per wound care -Will likely need placement to have any meaningful benefit from therapies -Social work  #2. Acute on chronic diastolic heart failure. Likely noncompliant with medications. Echo 2016 with an EF of 55%. Meds include spironolactone. Recently hospitalized for the same at which time he received Lasix 80 mg IV 3 times a day. He was discharged 7 days ago and discharge summary indicates his baseline weight is 290-300 pounds. Currently his weight is 310 pounds. Of note last hospitalization patient very uncooperative in obtaining daily weights and monitor intake and output. Would purposefully urinate all over the floor and refused to bear weight for daily weight -Admit -Continue spironolactone -IV Lasix 60 mg as noted above -Daily weights -Intake and output -Continue beta blocker with parameters  #3. Acute on chronic renal  failure stage III. Creatinine 1.3. Chart review indicates this a little above baseline. -Hold nephrotoxins -Monitor given need for IV Lasix -Monitor urine output  #4. Chronic atrial fibrillation. [place] Vascor 4. On Eliquis. Rate controlled -Continue beta blocker  #5. Hypertension. Controlled in the emergency department. -Continue home meds  #6. Chronic anemia. Hemoglobin 11. This appears to be close to his baseline. Signs symptoms of active bleeding -Monitor  #7. Hyponatremia.  Chronic. Sodium 130. Chart review indicates this is close to his baseline. -See above -Monitor   DVT prophylaxis: eliquis  Code Status: full  Family Communication: patient  Disposition Plan: placement  Consults called: social work, wound team  Admission status: inpatient    Gwenyth Bender MD [facility]  If 7PM-7AM, please contact night-coverage [email or web address] Password [email or web address]  11/24/2016, 2:52 PM

## 2016-11-24 NOTE — ED Triage Notes (Signed)
Brought in by [facility]. Hy of chronic leg swelling but within the last week, pt sts that it has gotten harder to ambulate and noticed a significant increase in swelling and weeping. Hy of HTN, and leg swelling. Vitals WDL with EMS

## 2016-11-24 NOTE — ED Notes (Signed)
Pt is quite upset, calling out in the room.  Checked on pt and he tells me that his bottom hurts.  Helped pt up.  Pt had soiled himself with urine and stool.  Pt is very resistant to staff help and verbally abusive to staff.  Staff did end up successfully cleaning up patient, lots of stool in gluteal fold and pt found to be very raw and bleeding from the raw area in buttock and gluteal fold.  Applied soothing cream liberally to raw area and a chuck to keep his shorts from getting soiled.  Pt helped back into bed after changing gown and sheet.  Pt also repositioned for comfort and sandwich given, chucks placed under oozing legs.  Pt calmer at this time

## 2016-11-25 ENCOUNTER — Inpatient Hospital Stay (HOSPITAL_COMMUNITY): Payer: Commercial Managed Care - HMO

## 2016-11-25 ENCOUNTER — Encounter (HOSPITAL_COMMUNITY): Payer: Self-pay | Admitting: General Practice

## 2016-11-25 DIAGNOSIS — E871 Hypo-osmolality and hyponatremia: Secondary | ICD-10-CM

## 2016-11-25 DIAGNOSIS — N183 Chronic kidney disease, stage 3 (moderate): Secondary | ICD-10-CM

## 2016-11-25 DIAGNOSIS — N179 Acute kidney failure, unspecified: Secondary | ICD-10-CM

## 2016-11-25 DIAGNOSIS — I480 Paroxysmal atrial fibrillation: Secondary | ICD-10-CM

## 2016-11-25 LAB — BASIC METABOLIC PANEL
ANION GAP: 10 (ref 5–15)
BUN: 34 mg/dL — ABNORMAL HIGH (ref 6–20)
CHLORIDE: 94 mmol/L — AB (ref 101–111)
CO2: 28 mmol/L (ref 22–32)
CREATININE: 1.33 mg/dL — AB (ref 0.61–1.24)
Calcium: 9 mg/dL (ref 8.9–10.3)
GFR calc non Af Amer: 53 mL/min — ABNORMAL LOW (ref >60)
Glucose, Bld: 156 mg/dL — ABNORMAL HIGH (ref 65–99)
Potassium: 3.8 mmol/L (ref 3.5–5.1)
SODIUM: 132 mmol/L — AB (ref 135–145)

## 2016-11-25 LAB — CBC
HCT: 37.6 % — ABNORMAL LOW (ref 39.0–52.0)
HEMOGLOBIN: 11.9 g/dL — AB (ref 13.0–17.0)
MCH: 27.7 pg (ref 26.0–34.0)
MCHC: 31.6 g/dL (ref 30.0–36.0)
MCV: 87.6 fL (ref 78.0–100.0)
PLATELETS: 341 10*3/uL (ref 150–400)
RBC: 4.29 MIL/uL (ref 4.22–5.81)
RDW: 14.3 % (ref 11.5–15.5)
WBC: 9.4 10*3/uL (ref 4.0–10.5)

## 2016-11-25 MED ORDER — FUROSEMIDE 10 MG/ML IJ SOLN
60.0000 mg | Freq: Three times a day (TID) | INTRAMUSCULAR | Status: DC
  Administered 2016-11-25 – 2016-11-28 (×9): 60 mg via INTRAVENOUS
  Filled 2016-11-25 (×11): qty 6

## 2016-11-25 NOTE — Care Management Note (Signed)
Case Management Note  Patient Details  Name: Jesus [name] MRN: [id number] Date of Birth: [birth year]  Subjective/Objective:                 Patient admitted from home with cellulitis. Patient lives at home alone, daughter is primary support. Concerns for unsafe living situation, possibly hoarder. [facility] discharged patient form services as well as [facility] per previous notes. [facility]. Per bedside RN, patient's daughter has got him to agree to SNF placement, refused with last hospital stay. PT eval pending. Continues IV abx for cellulitis in legs.   Action/Plan:  CM working with CSW to facilitate DC.    Expected Discharge Date:                  Expected Discharge Plan:  Skilled Nursing Facility  In-House Referral:  Clinical Social Work  Discharge planning Services  CM Consult  Post Acute Care Choice:    Choice offered to:     DME Arranged:    DME Agency:     HH Arranged:    HH Agency:     Status of Service:  In process, will continue to follow  If discussed at Long Length of Stay Meetings, dates discussed:    Additional Comments:  Lawerance SabalDebbie Donelda Mailhot, RN 11/25/2016, 10:33 AM

## 2016-11-25 NOTE — Progress Notes (Signed)
PROGRESS NOTE    Jesus [name]  ZOX:[id number]  DOB: 12/21/1945  DOA: 11/24/2016 PCP: [facility] Outpatient Specialists:   Hospital course:  70 y.o. male with medical history significant for chronic congestive heart failure, hypertension, chronic A. fib, chronic lower extremity edema/wound, peripheral vascular disease venous stasis ulcers presents to the emergency Department chief complaint of worsening pain bilateral lower extremity particular around the right. Initial evaluation reveals persistent and worsening lower extremity edema and erythema concerning for cellulitis as well as on elevated BNP, acute on chronic kidney disease.  Assessment & Plan:   #1. Chronic bilateral lower extremity wounds related to cellulitis and/or venous stasis in the setting of acute on chronic diastolic heart failure Evaluated by wound team who opined both legs are worse since his discharge 6 days PTA. Discharged with doxycycline unsure if he finished or if he was taking it.  In addition, there is concern that his home situation is unsafe and not conducive to him getting any better.  Patient reports increased pain and wounds appear worse from recent discharge according to [facility] nurse. Patient is afebrile nontoxic appearing lactic acid within the limits of normal. BNP 247. -Follow blood cultures, vascular studies pending. -Continue vanc and Zosyn Iv for now and will likely need to continue IV for a few days -IV Lasix 60 mg TID, follow lytes, I/O, weights -Unna boots per wound care -Will likely need placement to have any meaningful improvement or recovery -Social work consulted for assistance, in addition to Futures trader  #2. Acute on chronic diastolic heart failure. Likely noncompliant with medications. Echo 2016 with an EF of 55%. Meds include spironolactone. Recently hospitalized for the same at which time he received Lasix 80 mg IV 3 times a day. He was discharged 7 days ago and discharge summary  indicates his baseline weight is 290-300 pounds. Currently his weight is 310 pounds. Of note last hospitalization patient very uncooperative in obtaining daily weights and monitor intake and output. Would purposefully urinate all over the floor and refused to bear weight for daily weights.   All indications so far have been he intends to do the same for this admission.  Will continue to try but doubtful will get accurate I/O numbers.  -Continue spironolactone -IV Lasix 60 mg as noted above -Daily weights -Intake and output -Continue beta blocker with parameters  #3. Acute on chronic renal failure stage III. Creatinine 1.3. Follow Renal Function.  -Hold nephrotoxins -Monitor given need for IV Lasix -Monitor urine output the best that we can  #4. Chronic atrial fibrillation. ChadsVasc 4. On Eliquis. Rate controlled -Continue beta blocker  #5. Hypertension. Controlled in the emergency department. -Continue home meds  #6. Chronic anemia. Hemoglobin 11. This appears to be close to his baseline. Signs symptoms of active bleeding -Monitor  #7. Hyponatremia. Chronic. Sodium 130. Chart review indicates this is close to his baseline. -See above -Monitor  DVT prophylaxis: eliquis  Code Status: full  Family Communication: patient  Disposition Plan: placement for SNF Consults called: social work, wound team  Admission status: inpatient   Subjective: Pt without complaints.  He is in agreement with SNF at this time.  Objective: Vitals:   11/24/16 1504 11/24/16 1648 11/24/16 2145 11/25/16 0638  BP: 123/61 125/80 (!) 104/52 130/62  Pulse: 72 68 (!) 58 (!) 58  Resp: 20 (!) 24 19 20   Temp:   97.5 F (36.4 C) 97.3 F (36.3 C)  TempSrc:   Oral Oral  SpO2: 97% 95% 97% 98%  Weight: 133 kg (293 lb 3.4 oz)     Height: 5\' 10"  (1.778 m)       Intake/Output Summary (Last 24 hours) at 11/25/16 0827 Last data filed at 11/25/16 0416  Gross per 24 hour  Intake              650 ml  Output               275 ml  Net              375 ml   Filed Weights   11/24/16 1015 11/24/16 1504  Weight: (!) 140.6 kg (310 lb) 133 kg (293 lb 3.4 oz)   Exam:  General exam: awake, alert, NAD. Cooperative.  Respiratory system: Clear. No increased work of breathing. Cardiovascular system: S1 & S2 heard, RRR. No JVD, murmurs, gallops, clicks or pedal edema. Gastrointestinal system: Abdomen is nondistended, soft and nontender. Normal bowel sounds heard. Central nervous system: Alert and oriented. No focal neurological deficits. Extremities: severe bilateral diffuse chronic venous stasis with infection seen both feet.            Data Reviewed: Basic Metabolic Panel:  Recent Labs Lab 11/24/16 1110 11/25/16 0531  NA 130* 132*  K 4.3 3.8  CL 92* 94*  CO2 28 28  GLUCOSE 104* 156*  BUN 34* 34*  CREATININE 1.30* 1.33*  CALCIUM 9.2 9.0   Liver Function Tests:  Recent Labs Lab 11/24/16 1110  AST 23  ALT 19  ALKPHOS 181*  BILITOT 1.4*  PROT 7.1  ALBUMIN 3.2*   No results for input(s): LIPASE, AMYLASE in the last 168 hours. No results for input(s): AMMONIA in the last 168 hours. CBC:  Recent Labs Lab 11/24/16 1110 11/25/16 0531  WBC 9.6 9.4  NEUTROABS 7.8*  --   HGB 11.8* 11.9*  HCT 36.7* 37.6*  MCV 86.6 87.6  PLT 360 341   Cardiac Enzymes: No results for input(s): CKTOTAL, CKMB, CKMBINDEX, TROPONINI in the last 168 hours. CBG (last 3)  No results for input(s): GLUCAP in the last 72 hours. Recent Results (from the past 240 hour(s))  Blood culture (routine x 2)     Status: None (Preliminary result)   Collection Time: 11/24/16 11:20 AM  Result Value Ref Range Status   Specimen Description BLOOD LEFT HAND  Final   Special Requests BOTTLES DRAWN AEROBIC ONLY  10CC  Final   Culture PENDING  Incomplete   Report Status PENDING  Incomplete     Studies: No results found.   Scheduled Meds: . apixaban  5 mg Oral BID  . atorvastatin  80 mg Oral Daily  .  carvedilol  6.25 mg Oral BID WC  . furosemide  60 mg Intravenous TID  . pantoprazole  40 mg Oral BID  . piperacillin-tazobactam (ZOSYN)  IV  3.375 g Intravenous Q8H  . sodium chloride flush  3 mL Intravenous Q12H  . spironolactone  25 mg Oral Daily  . vancomycin  1,250 mg Intravenous Q24H   Continuous Infusions:  Principal Problem:   Cellulitis Active Problems:   Atrial fibrillation (HCC)   Hypertension   Acute on chronic diastolic CHF (congestive heart failure) (HCC)   Morbid obesity (HCC)   Peripheral vascular disease (HCC)   Chronic venous insufficiency   Chronic anemia   Acute on chronic renal failure (HCC)   Hyponatremia   Cellulitis of lower extremity  Time spent:   Standley Dakinslanford Kiyla Ringler, MD, FAAFP [facility] Pager [phone]  If  7PM-7AM, please contact night-coverage [email or web address] Password [email or web address] 11/25/2016, 8:27 AM    LOS: 1 day

## 2016-11-26 ENCOUNTER — Encounter (HOSPITAL_COMMUNITY): Payer: Self-pay

## 2016-11-26 LAB — CBC WITH DIFFERENTIAL/PLATELET
Basophils Absolute: 0.1 10*3/uL (ref 0.0–0.1)
Basophils Relative: 1 %
Eosinophils Absolute: 0.3 10*3/uL (ref 0.0–0.7)
Eosinophils Relative: 4 %
HCT: 35.5 % — ABNORMAL LOW (ref 39.0–52.0)
Hemoglobin: 11.2 g/dL — ABNORMAL LOW (ref 13.0–17.0)
LYMPHS ABS: 0.8 10*3/uL (ref 0.7–4.0)
Lymphocytes Relative: 9 %
MCH: 27.9 pg (ref 26.0–34.0)
MCHC: 31.5 g/dL (ref 30.0–36.0)
MCV: 88.5 fL (ref 78.0–100.0)
MONOS PCT: 7 %
Monocytes Absolute: 0.6 10*3/uL (ref 0.1–1.0)
NEUTROS ABS: 6.7 10*3/uL (ref 1.7–7.7)
NEUTROS PCT: 79 %
PLATELETS: 352 10*3/uL (ref 150–400)
RBC: 4.01 MIL/uL — AB (ref 4.22–5.81)
RDW: 14.1 % (ref 11.5–15.5)
WBC: 8.4 10*3/uL (ref 4.0–10.5)

## 2016-11-26 LAB — BASIC METABOLIC PANEL
Anion gap: 11 (ref 5–15)
BUN: 30 mg/dL — ABNORMAL HIGH (ref 6–20)
CHLORIDE: 93 mmol/L — AB (ref 101–111)
CO2: 29 mmol/L (ref 22–32)
Calcium: 8.9 mg/dL (ref 8.9–10.3)
Creatinine, Ser: 1.46 mg/dL — ABNORMAL HIGH (ref 0.61–1.24)
GFR calc non Af Amer: 47 mL/min — ABNORMAL LOW (ref >60)
GFR, EST AFRICAN AMERICAN: 54 mL/min — AB (ref >60)
Glucose, Bld: 153 mg/dL — ABNORMAL HIGH (ref 65–99)
POTASSIUM: 3.8 mmol/L (ref 3.5–5.1)
Sodium: 133 mmol/L — ABNORMAL LOW (ref 135–145)

## 2016-11-26 MED ORDER — VANCOMYCIN HCL 10 G IV SOLR
1250.0000 mg | INTRAVENOUS | Status: DC
  Filled 2016-11-26: qty 1250

## 2016-11-26 MED ORDER — HYDROXYZINE HCL 25 MG PO TABS
25.0000 mg | ORAL_TABLET | Freq: Three times a day (TID) | ORAL | Status: DC | PRN
  Administered 2016-11-26: 25 mg via ORAL
  Filled 2016-11-26: qty 1

## 2016-11-26 MED ORDER — VANCOMYCIN HCL 10 G IV SOLR
1500.0000 mg | INTRAVENOUS | Status: DC
  Filled 2016-11-26: qty 1500

## 2016-11-26 NOTE — NC FL2 (Signed)
MEDICAID FL2 LEVEL OF CARE SCREENING TOOL     IDENTIFICATION  Patient Name: Jesus [name] Birthdate: [birth year] Sex: male Admission Date (Current Location): 11/24/2016  [facility] [place]   Facility and Address:  [facility], [address], [place], [place] [address]      Provider Number: [id number]  Attending Physician Name and Address:  Cleora Fleetlanford L Johnson, MD  Relative Name and Phone Number:       Current Level of Care: Hospital Recommended Level of Care: Skilled Nursing Facility Prior Approval Number:    Date Approved/Denied:   PASRR Number: [id number]  Discharge Plan: SNF    Current Diagnoses: Patient Active Problem List   Diagnosis Date Noted  . Cellulitis of lower extremity   . Hyponatremia 07/15/2015  . Cellulitis of right lower extremity 07/15/2015  . Acute on chronic renal failure (HCC)   . Hypokalemia   . Chronic atrial fibrillation (HCC) 07/09/2015  . Chronic anemia 07/09/2015  . CHF exacerbation (HCC) 07/08/2015  . Chronic diastolic CHF (congestive heart failure) (HCC) 04/02/2015  . Chronic diastolic heart failure (HCC) 01/25/2015  . Cellulitis 01/17/2015  . Pulmonary vascular congestion   . Congestive heart disease (HCC)   . Peripheral edema   . Acute exacerbation of CHF (congestive heart failure) (HCC) 01/04/2015  . 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 Renal artery stenosis (HCC) 02/17/2014  . Peripheral vascular disease (HCC) 04/15/2013  . Chronic venous insufficiency 04/15/2013  . Morbid obesity (HCC) 06/11/2012  . Acute on chronic diastolic CHF (congestive heart failure) (HCC) 06/02/2012  . Unspecified essential hypertension 05/18/2012  . Atrial fibrillation (HCC) 04/27/2012  . CHF (congestive heart failure) (HCC) 04/27/2012  . Hypertension 04/27/2012     Orientation RESPIRATION BLADDER Height & Weight     Self, Time, Situation, Place  Normal Incontinent Weight: 298 lb 9.6 oz (135.4 kg) Height:  5\' 10"  (177.8 cm)  BEHAVIORAL SYMPTOMS/MOOD NEUROLOGICAL BOWEL NUTRITION STATUS      Continent Diet (see DC summary)  AMBULATORY STATUS COMMUNICATION OF NEEDS Skin   Limited Assist Verbally Normal                       Personal Care Assistance Level of Assistance  Bathing, Dressing Bathing Assistance: Limited assistance   Dressing Assistance: Limited assistance     Functional Limitations Info             SPECIAL CARE FACTORS FREQUENCY  PT (By licensed PT), OT (By licensed OT)     PT Frequency: 5/wk OT Frequency: 5/wk            Contractures      Additional Factors Info  Code Status, Allergies Code Status Info: FULL Allergies Info: Amlodipine Besylate           Current Medications (11/26/2016):  This is the current hospital active medication list Current Facility-Administered Medications  Medication Dose Route Frequency Provider Last Rate Last Dose  . 0.9 %  sodium chloride infusion  250 mL Intravenous PRN Gwenyth BenderKaren M Black, NP      . acetaminophen (TYLENOL) tablet 650 mg  650 mg Oral Q6H PRN Gwenyth BenderKaren M Black, NP       Or  . acetaminophen (TYLENOL) suppository 650 mg  650 mg Rectal Q6H PRN Gwenyth BenderKaren M Black, NP      . apixaban Everlene Balls(ELIQUIS) tablet 5  mg  5 mg Oral BID Gwenyth Bender, NP   5 mg at 11/26/16 1610  . atorvastatin (LIPITOR) tablet 80 mg  80 mg Oral Daily Gwenyth Bender, NP   80 mg at 11/25/16 1738  . carvedilol (COREG) tablet 6.25 mg  6.25 mg Oral BID WC Gwenyth Bender, NP   6.25 mg at 11/26/16 0843  . furosemide (LASIX) injection 60 mg  60 mg Intravenous TID Clanford Cyndie Mull, MD   60 mg at 11/26/16 0844  . HYDROcodone-acetaminophen (NORCO/VICODIN) 5-325 MG per tablet 1-2 tablet  1-2 tablet Oral Q4H PRN Gwenyth Bender, NP   2 tablet at 11/26/16 [phone]  . ondansetron (ZOFRAN) tablet 4 mg  4 mg Oral Q6H PRN Gwenyth Bender, NP       Or  . ondansetron [facility]) injection 4 mg  4 mg Intravenous Q6H PRN Gwenyth Bender, NP      . pantoprazole (PROTONIX) EC tablet 40 mg  40 mg Oral BID Gwenyth Bender, NP   40 mg at 11/26/16 5409  . piperacillin-tazobactam (ZOSYN) IVPB 3.375 g  3.375 g Intravenous Q8H Almon Hercules, RPH   3.375 g at 11/26/16 0501  . sodium chloride flush (NS) 0.9 % injection 3 mL  3 mL Intravenous Q12H Lesle Chris Black, NP   3 mL at 11/25/16 2211  . spironolactone (ALDACTONE) tablet 25 mg  25 mg Oral Daily Gwenyth Bender, NP   25 mg at 11/26/16 0843  . vancomycin (VANCOCIN) 1,250 mg in sodium chloride 0.9 % 250 mL IVPB  1,250 mg Intravenous Q24H Clanford Cyndie Mull, MD         Discharge Medications: Please see discharge summary for a list of discharge medications.  Relevant Imaging Results:  Relevant Lab Results:   Additional Information SS#: [id number]  Burna Sis, LCSW

## 2016-11-26 NOTE — Clinical Social Work Note (Signed)
Clinical Social Work Assessment  Patient Details  Name: Jesus [name] MRN: [id number] Date of Birth: May 06, 1946  Date of referral:  11/26/16               Reason for consult:  Facility Placement                Permission sought to share information with:  Facility Medical sales representativeContact Representative, Family Supports Permission granted to share information::  Yes, Verbal Permission Granted  [facility]  Agency::  SNF  Relationship::  dtr  Contact Information:     Housing/Transportation Living arrangements for the past 2 months:  Apartment Source of Information:  Patient, Adult Children Patient Interpreter Needed:  None Criminal Activity/Legal Involvement Pertinent to Current Situation/Hospitalization:  No - Comment as needed Significant Relationships:  Adult Children Lives with:  Self Do you feel safe going back to the place where you live?  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     Social Worker assessment / plan:  CSW spoke with pt concerning PT recommendation for SNF.  Pt was recommended for SNF on last admission and was refusing adamantly but is willing to consider this time.  CSW explained SNF and SNF referral process.  Employment status:  Retired Database administratornsurance information:  Managed Medicare PT Recommendations:  Not assessed at this time Information / Referral to community resources:  Skilled Nursing Facility  Patient/Family's Response to care:  Pt agreeable to SNF stay- prefers [facility] if possible.  Patient/Family's Understanding of and Emotional Response to Diagnosis, Current Treatment, and Prognosis:  Pt has good understanding of condition- states he takes an annual "pilgramage" to the hospital to get fluid off.  Pt has good outlook on life and is realistic about his continuing medical issues.  Emotional  Assessment Appearance:  Appears stated age Attitude/Demeanor/Rapport:    Affect (typically observed):  Appropriate Orientation:  Oriented to Self, Oriented to Place, Oriented to  Time, Oriented to Situation Alcohol / Substance use:  Not Applicable Psych involvement (Current and /or in the community):  No (Comment)  Discharge Needs  Concerns to be addressed:  Care Coordination Readmission within the last 30 days:  Yes Current discharge risk:  Physical Impairment Barriers to Discharge:  Continued Medical Work up   Burna SisUris, [place] Deleo H, LCSW 11/26/2016, 10:53 AM

## 2016-11-26 NOTE — Progress Notes (Signed)
PROGRESS NOTE    Jesus [name]  RUE:[id number]  DOB: [birth year]  DOA: 11/24/2016 PCP: [facility] Outpatient Specialists:   Hospital course:  70 y.o. male with medical history significant for chronic congestive heart failure, hypertension, chronic A. fib, chronic lower extremity edema/wound, peripheral vascular disease venous stasis ulcers presents to the emergency Department chief complaint of worsening pain bilateral lower extremity particular around the right. Initial evaluation reveals persistent and worsening lower extremity edema and erythema concerning for cellulitis as well as on elevated BNP, acute on chronic kidney disease.  Assessment & Plan:   #1. Chronic bilateral lower extremity wounds related to cellulitis and/or venous stasis in the setting of acute on chronic diastolic heart failure Evaluated by wound team who opined both legs are worse since his discharge 6 days PTA. Discharged with doxycycline unsure if he finished or if he was taking it.  In addition, there is concern that his home situation is unsafe and not conducive to him getting any better.  Patient reports increased pain and wounds appear worse from recent discharge according to [facility] nurse. Patient is afebrile nontoxic appearing lactic acid within the limits of normal. BNP 247. -Follow blood cultures, NGTD -Continue Zosyn Iv for now and DC vancomycin -IV Lasix 60 mg TID, follow lytes, I/O, weights -Unna boots per wound care -Will likely need placement to have any meaningful improvement or recovery -Social work consulted for assistance, in addition to Futures trader  #2. Acute on chronic diastolic heart failure. Likely noncompliant with medications. Echo 2016 with an EF of 55%. Meds include spironolactone. Recently hospitalized for the same at which time he received Lasix 80 mg IV 3 times a day. He was discharged 7 days ago and discharge summary indicates his baseline weight is 290-300 pounds. Currently his  weight is 310 pounds. Of note last hospitalization patient very uncooperative in obtaining daily weights and monitor intake and output. Would purposefully urinate all over the floor and refused to bear weight for daily weights.   All indications so far have been he intends to do the same for this admission.  Will continue to try but doubtful will get accurate I/O numbers.  -Continue spironolactone -IV Lasix 60 mg as noted above -Daily weights -Intake and output -Continue beta blocker with parameters  #3. Acute on chronic renal failure stage III. Creatinine 1.3. Follow Renal Function.  -Hold nephrotoxins -Monitor given need for IV Lasix -Monitor urine output the best that we can, asked for condom cath  #4. Chronic atrial fibrillation. ChadsVasc 4. On Eliquis. Rate controlled -Continue beta blocker  #5. Hypertension. Controlled in the emergency department. -Continue home meds  #6. Chronic anemia. Hemoglobin 11. This appears to be close to his baseline. Signs symptoms of active bleeding -Monitor  #7. Hyponatremia. Chronic. Sodium 130. Chart review indicates this is close to his baseline. -See above -Monitor  DVT prophylaxis: eliquis  Code Status: full  Family Communication: patient  Disposition Plan: placement for SNF likely in 1-2 days Consults called: social work, wound team  Admission status: inpatient   Subjective: Pt reports that he has lost about 10 pounds in fluid weight since admission.  Objective: Vitals:   11/25/16 0638 11/25/16 1536 11/25/16 2140 11/26/16 0514  BP: 130/62 (!) 102/47 114/64 133/77  Pulse: (!) 58 67 60 62  Resp: 20 15 20 18   Temp: 97.3 F (36.3 C) 98.2 F (36.8 C) 97.9 F (36.6 C) 97.7 F (36.5 C)  TempSrc: Oral Oral Oral Oral  SpO2: 98% 98% 97%  98%  Weight:    135.4 kg (298 lb 9.6 oz)  Height:        Intake/Output Summary (Last 24 hours) at 11/26/16 1245 Last data filed at 11/26/16 0515  Gross per 24 hour  Intake              880  ml  Output                0 ml  Net              880 ml   Filed Weights   11/24/16 1015 11/24/16 1504 11/26/16 0514  Weight: (!) 140.6 kg (310 lb) 133 kg (293 lb 3.4 oz) 135.4 kg (298 lb 9.6 oz)   Exam:  General exam: awake, alert, NAD. Cooperative.  Respiratory system: Clear. No increased work of breathing. Cardiovascular system: S1 & S2 heard, RRR. No JVD, murmurs, gallops, clicks or pedal edema. Gastrointestinal system: Abdomen is nondistended, soft and nontender. Normal bowel sounds heard. Central nervous system: Alert and oriented. No focal neurological deficits. Extremities: severe bilateral diffuse chronic venous stasis - appears slightly improved, less edematous than prior exam       Data Reviewed: Basic Metabolic Panel:  Recent Labs Lab 11/24/16 1110 11/25/16 0531 11/26/16 0559  NA 130* 132* 133*  K 4.3 3.8 3.8  CL 92* 94* 93*  CO2 28 28 29   GLUCOSE 104* 156* 153*  BUN 34* 34* 30*  CREATININE 1.30* 1.33* 1.46*  CALCIUM 9.2 9.0 8.9   Liver Function Tests:  Recent Labs Lab 11/24/16 1110  AST 23  ALT 19  ALKPHOS 181*  BILITOT 1.4*  PROT 7.1  ALBUMIN 3.2*   No results for input(s): LIPASE, AMYLASE in the last 168 hours. No results for input(s): AMMONIA in the last 168 hours. CBC:  Recent Labs Lab 11/24/16 1110 11/25/16 0531 11/26/16 0559  WBC 9.6 9.4 8.4  NEUTROABS 7.8*  --  6.7  HGB 11.8* 11.9* 11.2*  HCT 36.7* 37.6* 35.5*  MCV 86.6 87.6 88.5  PLT 360 341 352   Cardiac Enzymes: No results for input(s): CKTOTAL, CKMB, CKMBINDEX, TROPONINI in the last 168 hours. CBG (last 3)  No results for input(s): GLUCAP in the last 72 hours. Recent Results (from the past 240 hour(s))  Blood culture (routine x 2)     Status: None (Preliminary result)   Collection Time: 11/24/16 11:10 AM  Result Value Ref Range Status   Specimen Description BLOOD LEFT ANTECUBITAL  Final   Special Requests   Final    BOTTLES DRAWN AEROBIC AND ANAEROBIC  10CC AEB 5CC  ANA   Culture NO GROWTH 2 DAYS  Final   Report Status PENDING  Incomplete  Blood culture (routine x 2)     Status: None (Preliminary result)   Collection Time: 11/24/16 11:20 AM  Result Value Ref Range Status   Specimen Description BLOOD LEFT HAND  Final   Special Requests BOTTLES DRAWN AEROBIC ONLY  10CC  Final   Culture NO GROWTH 2 DAYS  Final   Report Status PENDING  Incomplete    Studies: No results found.  Scheduled Meds: . apixaban  5 mg Oral BID  . atorvastatin  80 mg Oral Daily  . carvedilol  6.25 mg Oral BID WC  . furosemide  60 mg Intravenous TID  . pantoprazole  40 mg Oral BID  . piperacillin-tazobactam (ZOSYN)  IV  3.375 g Intravenous Q8H  . sodium chloride flush  3 mL Intravenous Q12H  .  spironolactone  25 mg Oral Daily  . vancomycin  1,500 mg Intravenous Q24H   Continuous Infusions:  Principal Problem:   Cellulitis Active Problems:   Atrial fibrillation (HCC)   Hypertension   Acute on chronic diastolic CHF (congestive heart failure) (HCC)   Morbid obesity (HCC)   Peripheral vascular disease (HCC)   Chronic venous insufficiency   Chronic anemia   Acute on chronic renal failure (HCC)   Hyponatremia   Cellulitis of lower extremity  Time spent:   Standley Dakinslanford Sherrian Nunnelley, MD, FAAFP [facility] Pager [phone]  If 7PM-7AM, please contact night-coverage [email or web address] Password [email or web address] 11/26/2016, 12:45 PM    LOS: 2 days

## 2016-11-26 NOTE — Evaluation (Signed)
Physical Therapy Evaluation Patient Details Name: Jesus [name] MRN: [id number] DOB: 1946/11/11 Today's Date: 11/26/2016   History of Present Illness  Pt is a 70 y/o male admitted secondary to bilateral LE cellulitis. Of note, pt recently admitted on 12/7 secondary to worsening bilateral LE edema. PMH including but not limited to chronic A-fib, CHF and HTN.  Clinical Impression  Pt presented sitting OOB when PT entered room. Prior to admission, pt reported that he is mod I with functional mobility, using a SPC to ambulate (or otherwise "furniture cruising" at home) and uses motorized scooters when out shopping. Pt currently requires min A x2 for safety with transfers and mobility. Pt is agreeable to d/c to SNF this admission. Pt would continue to benefit from skilled physical therapy services at this time while admitted and after d/c to address his below listed limitations in order to improve his overall safety and independence with functional mobility.     Follow Up Recommendations SNF;Supervision/Assistance - 24 hour    Equipment Recommendations  None recommended by PT;Other (comment) (defer to next venue)    Recommendations for Other Services       Precautions / Restrictions Precautions Precautions: Fall Restrictions Weight Bearing Restrictions: No      Mobility  Bed Mobility               General bed mobility comments: pt sitting OOB in recliner when PT entered room  Transfers Overall transfer level: Needs assistance Equipment used: Rolling walker (2 wheeled) Transfers: Sit to/from Stand Sit to Stand: Min assist;+2 safety/equipment         General transfer comment: pt required increased time, VC'ing for bilateral hand placement and min A to power up from recliner to achieve full standing position. Min A also for safety and stability upon standing.  Ambulation/Gait Ambulation/Gait assistance: Min assist;+2 safety/equipment Ambulation Distance (Feet): 25  Feet Assistive device: Rolling walker (2 wheeled) Gait Pattern/deviations: Step-through pattern;Decreased stride length Gait velocity: decreased Gait velocity interpretation: Below normal speed for age/gender General Gait Details: pt required frequent VC'ing for safety with use of RW  Stairs            Wheelchair Mobility    Modified Rankin (Stroke Patients Only)       Balance Overall balance assessment: Needs assistance Sitting-balance support: Feet supported;No upper extremity supported Sitting balance-Leahy Scale: Good     Standing balance support: During functional activity;Bilateral upper extremity supported Standing balance-Leahy Scale: Poor Standing balance comment: pt reliant on bilateral UEs on RW                             Pertinent Vitals/Pain Pain Assessment: Faces Faces Pain Scale: Hurts little more Pain Location: bilateral LEs Pain Descriptors / Indicators: Sore;Grimacing Pain Intervention(s): Monitored during session;Repositioned    Home Living Family/patient expects to be discharged to:: Skilled nursing facility                 Additional Comments: pt previously living alone but now agreeable to go [facility] rehab [facility]    Prior Function Level of Independence: Independent with assistive device(s)         Comments: pt reported that he uses his cane to ambulate outside of his home, but basically furniture cruises when in his small one bedroom apartment. pt reports that he uses the motorized scooter carts when out shopping.      Hand Dominance  Extremity/Trunk Assessment   Upper Extremity Assessment Upper Extremity Assessment: Defer to OT evaluation    Lower Extremity Assessment Lower Extremity Assessment: Overall WFL for tasks assessed       Communication   Communication: No difficulties  Cognition Arousal/Alertness: Awake/alert Behavior During Therapy: WFL for tasks assessed/performed Overall  Cognitive Status: Within Functional Limits for tasks assessed                      General Comments      Exercises     Assessment/Plan    PT Assessment Patient needs continued PT services  PT Problem List Decreased strength;Decreased activity tolerance;Decreased balance;Decreased mobility;Decreased range of motion;Decreased coordination;Decreased knowledge of use of DME;Decreased safety awareness;Pain          PT Treatment Interventions DME instruction;Gait training;Functional mobility training;Therapeutic activities;Therapeutic exercise;Balance training;Neuromuscular re-education;Patient/family education    PT Goals (Current goals can be found in the Care Plan section)  Acute Rehab PT Goals Patient Stated Goal: decrease pain PT Goal Formulation: With patient Time For Goal Achievement: 12/10/16 Potential to Achieve Goals: Good    Frequency Min 2X/week   Barriers to discharge        Co-evaluation PT/OT/SLP Co-Evaluation/Treatment: Yes Reason for Co-Treatment: For patient/therapist safety;To address functional/ADL transfers PT goals addressed during session: Mobility/safety with mobility;Balance;Proper use of DME;Strengthening/ROM         End of Session Equipment Utilized During Treatment: Gait belt Activity Tolerance: Patient limited by fatigue Patient left: in chair;with call bell/phone within reach;with chair alarm set;with family/visitor present Nurse Communication: Mobility status         Time: 1125-1150 PT Time Calculation (min) (ACUTE ONLY): 25 min   Charges:   PT Evaluation $PT Eval Moderate Complexity: 1 Procedure     PT G CodesAlessandra Bevels:        Ailene Royal M Early Ord 11/26/2016, 12:16 PM Deborah ChalkJennifer Ronan Dion, PT, DPT [phone]

## 2016-11-26 NOTE — Progress Notes (Addendum)
Pharmacy Antibiotic Note  Jesus MooresDouglas M Khun is a 70 y.o. male admitted on 11/24/2016 with cellulitis/wound infection.    Continues on Vancomycin and Zosyn Day #3 Scr stable, cultures negative to date  Plan: Vancomycin stopped now Continue Zosyn 3.375 grams iv Q 8 hours - 4 hr infusion   Height: 5\' 10"  (177.8 cm) Weight: 298 lb 9.6 oz (135.4 kg) IBW/kg (Calculated) : 73  Temp (24hrs), Avg:97.9 F (36.6 C), Min:97.7 F (36.5 C), Max:98.2 F (36.8 C)   Recent Labs Lab 11/24/16 1110 11/24/16 1156 11/25/16 0531 11/26/16 0559  WBC 9.6  --  9.4 8.4  CREATININE 1.30*  --  1.33* 1.46*  LATICACIDVEN  --  1.33  --   --     Estimated Creatinine Clearance: 65.3 mL/min (by C-G formula based on SCr of 1.46 mg/dL (H)).    Allergies  Allergen Reactions  . Amlodipine Besylate Swelling and Other (See Comments)    "started off low and ended up severe; if, in fact, that is what's causing the swelling" (06/03/12)   Thank you Okey RegalLisa Brice Kossman, PharmD [phone] 11/26/2016 10:24 AM

## 2016-11-26 NOTE — Evaluation (Signed)
Occupational Therapy Evaluation Patient Details Name: Jesus MooresDouglas M Kennan MRN: [id number] DOB: 23-Dec-1945 Today's Date: 11/26/2016    History of Present Illness Pt is a 70 y/o male admitted secondary to bilateral LE cellulitis. Of note, pt recently admitted on 12/7 secondary to worsening bilateral LE edema. PMH including but not limited to chronic A-fib, CHF and HTN.   Clinical Impression   Pt reports he was managing ADL independently PTA. Currently pt requires min assist +2 for functional mobility, min assist for seated UB ADL, and max assist for LB ADL. Recommending SNF for follow up to maximize independence and safety with ADL and functional mobility prior to return home. Pt would benefit from continued skilled OT to address established goals.    Follow Up Recommendations  SNF;Supervision/Assistance - 24 hour    Equipment Recommendations  Other (comment) (TBD)    Recommendations for Other Services       Precautions / Restrictions Precautions Precautions: Fall Restrictions Weight Bearing Restrictions: No      Mobility Bed Mobility               General bed mobility comments: pt sitting OOB in recliner when PT entered room  Transfers Overall transfer level: Needs assistance Equipment used: Rolling walker (2 wheeled) Transfers: Sit to/from Stand Sit to Stand: Min assist;+2 safety/equipment         General transfer comment: pt required increased time, VC'ing for bilateral hand placement and min A to power up from recliner to achieve full standing position. Min A also for safety and stability upon standing.    Balance Overall balance assessment: Needs assistance Sitting-balance support: Feet supported;No upper extremity supported Sitting balance-Leahy Scale: Good     Standing balance support: During functional activity;Bilateral upper extremity supported Standing balance-Leahy Scale: Poor Standing balance comment: pt reliant on bilateral UEs on RW                             ADL Overall ADL's : Needs assistance/impaired Eating/Feeding: Set up;Sitting   Grooming: Set up;Supervision/safety;Sitting   Upper Body Bathing: Minimal assistance;Sitting   Lower Body Bathing: Maximal assistance;Sit to/from stand   Upper Body Dressing : Minimal assistance;Sitting   Lower Body Dressing: Maximal assistance;Sit to/from stand Lower Body Dressing Details (indicate cue type and reason): to don/doff socks Toilet Transfer: Minimal assistance;+2 for safety/equipment;Ambulation;BSC;RW Toilet Transfer Details (indicate cue type and reason): Simulated by sit to stand from chair with functional mobility in room         Functional mobility during ADLs: Minimal assistance;Rolling walker;+2 for safety/equipment       Vision     Perception     Praxis      Pertinent Vitals/Pain Pain Assessment: Faces Faces Pain Scale: Hurts little more Pain Location: bilateral LEs Pain Descriptors / Indicators: Sore;Grimacing Pain Intervention(s): Monitored during session     Hand Dominance     Extremity/Trunk Assessment Upper Extremity Assessment Upper Extremity Assessment: Overall WFL for tasks assessed   Lower Extremity Assessment Lower Extremity Assessment: Defer to PT evaluation   Cervical / Trunk Assessment Cervical / Trunk Assessment: Other exceptions Cervical / Trunk Exceptions: morbid obesity   Communication Communication Communication: No difficulties   Cognition Arousal/Alertness: Awake/alert Behavior During Therapy: WFL for tasks assessed/performed Overall Cognitive Status: Within Functional Limits for tasks assessed                     General Comments  Exercises       Shoulder Instructions      Home Living Family/patient expects to be discharged to:: Skilled nursing facility                                 Additional Comments: pt previously living alone but now agreeable to go to rehab  [facility]      Prior Functioning/Environment Level of Independence: Independent with assistive device(s)        Comments: pt reported that he uses his cane to ambulate outside of his home, but basically furniture cruises when in his small one bedroom apartment. pt reports that he uses the motorized scooter carts when out shopping. Pt reports that he typically sponge bathes and requires increased time for BADL, cooks but "just enough to get by"        OT Problem List: Decreased activity tolerance;Impaired balance (sitting and/or standing);Decreased safety awareness;Decreased knowledge of use of DME or AE;Decreased knowledge of precautions;Obesity;Increased edema   OT Treatment/Interventions: Self-care/ADL training;Energy conservation;DME and/or AE instruction;Therapeutic activities;Patient/family education;Balance training    OT Goals(Current goals can be found in the care plan section) Acute Rehab OT Goals Patient Stated Goal: decrease pain OT Goal Formulation: With patient Time For Goal Achievement: 12/10/16 Potential to Achieve Goals: Good  OT Frequency: Min 2X/week   Barriers to D/C: Decreased caregiver support  pt lives alone       Co-evaluation PT/OT/SLP Co-Evaluation/Treatment: Yes Reason for Co-Treatment: For patient/therapist safety;To address functional/ADL transfers PT goals addressed during session: Mobility/safety with mobility;Balance;Proper use of DME;Strengthening/ROM OT goals addressed during session: ADL's and self-care      End of Session Equipment Utilized During Treatment: Rolling walker;Gait belt  Activity Tolerance: Patient tolerated treatment well Patient left: in chair;with call bell/phone within reach;with chair alarm set;with family/visitor present   Time: 1125-1150 OT Time Calculation (min): 25 min Charges:  OT General Charges $OT Visit: 1 Procedure OT Evaluation $OT Eval Moderate Complexity: 1 Procedure G-Codes:     Gaye AlkenBailey A  Girolamo Lortie M.S., OTR/L Pager: [phone]: [phone]  11/26/2016, 12:22 PM

## 2016-11-27 ENCOUNTER — Inpatient Hospital Stay (HOSPITAL_COMMUNITY): Payer: Commercial Managed Care - HMO

## 2016-11-27 ENCOUNTER — Encounter (HOSPITAL_COMMUNITY): Payer: Self-pay

## 2016-11-27 LAB — CBC WITH DIFFERENTIAL/PLATELET
BASOS ABS: 0 10*3/uL (ref 0.0–0.1)
BASOS PCT: 0 %
EOS ABS: 0.3 10*3/uL (ref 0.0–0.7)
Eosinophils Relative: 4 %
HEMATOCRIT: 36.6 % — AB (ref 39.0–52.0)
HEMOGLOBIN: 11.7 g/dL — AB (ref 13.0–17.0)
Lymphocytes Relative: 8 %
Lymphs Abs: 0.6 10*3/uL — ABNORMAL LOW (ref 0.7–4.0)
MCH: 28.7 pg (ref 26.0–34.0)
MCHC: 32 g/dL (ref 30.0–36.0)
MCV: 89.7 fL (ref 78.0–100.0)
Monocytes Absolute: 0.6 10*3/uL (ref 0.1–1.0)
Monocytes Relative: 7 %
NEUTROS ABS: 6.9 10*3/uL (ref 1.7–7.7)
NEUTROS PCT: 81 %
Platelets: 366 10*3/uL (ref 150–400)
RBC: 4.08 MIL/uL — AB (ref 4.22–5.81)
RDW: 14.4 % (ref 11.5–15.5)
WBC: 8.4 10*3/uL (ref 4.0–10.5)

## 2016-11-27 LAB — BASIC METABOLIC PANEL
ANION GAP: 6 (ref 5–15)
BUN: 26 mg/dL — ABNORMAL HIGH (ref 6–20)
CALCIUM: 9.1 mg/dL (ref 8.9–10.3)
CO2: 34 mmol/L — AB (ref 22–32)
CREATININE: 1.43 mg/dL — AB (ref 0.61–1.24)
Chloride: 93 mmol/L — ABNORMAL LOW (ref 101–111)
GFR calc non Af Amer: 48 mL/min — ABNORMAL LOW (ref >60)
GFR, EST AFRICAN AMERICAN: 56 mL/min — AB (ref >60)
Glucose, Bld: 167 mg/dL — ABNORMAL HIGH (ref 65–99)
Potassium: 4.1 mmol/L (ref 3.5–5.1)
SODIUM: 133 mmol/L — AB (ref 135–145)

## 2016-11-27 NOTE — Progress Notes (Signed)
Patient was complaining of not getting enough food. He is requesting food between meals. Even if patient was educated about his diet, he continue to ask for extra food. Per MD order, patient can have an extra portion. Will continue to monitor.

## 2016-11-27 NOTE — Progress Notes (Signed)
VASCULAR LAB    Patient has refused to have ABI multiple times over multiple days, even after saying he was agreeable to test.       Koral Thaden, RVT 11/27/2016, 12:05 PM

## 2016-11-27 NOTE — Progress Notes (Signed)
PROGRESS NOTE    Jesus [name]  AOZ:[id number]  DOB: 06-08-1946  DOA: 11/24/2016 PCP: [name],[name], [name] Outpatient Specialists:   Hospital course:  70 y.o. male with medical history significant for chronic congestive heart failure, hypertension, chronic A. fib, chronic lower extremity edema/wound, peripheral vascular disease venous stasis ulcers presents to the emergency Department chief complaint of worsening pain bilateral lower extremity particular around the right. Initial evaluation reveals persistent and worsening lower extremity edema and erythema concerning for cellulitis as well as on elevated BNP, acute on chronic kidney disease.  Assessment & Plan:   #1. Chronic bilateral lower extremity wounds related to cellulitis and chronic poorly managed venous stasis in the setting of acute on chronic diastolic heart failure Evaluated by wound team who opined both legs are worse since his discharge 6 days PTA. Discharged with doxycycline unsure if he finished or if he was taking it.  In addition, there is concern that his home situation is unsafe and not conducive to him getting any better.  Patient reports increased pain and wounds appear worse [facility]. Patient is afebrile nontoxic appearing lactic acid within the limits of normal. BNP 247. -Follow blood cultures, NGTD -Continue Zosyn Iv for now and DC'd vancomycin -IV Lasix 60 mg TID, follow lytes, I/O, weights: noticeable improvement with diuresis -Unna boots per wound care  -Working on skilled nursing placement for patient to have any meaningful improvement or chance of recovery -Social work consulted for assistance, in addition to care manager: Updated by Child psychotherapistsocial worker 12/21, pt may not be accepted to his first choice facility and has been agitated since hearing the news.  Working on back up options.   #2. Acute on chronic diastolic heart failure. Likely noncompliant with diet and medications.  Echo 2016 with an EF of 55%. Meds include spironolactone. Recently hospitalized for the same at which time he received Lasix 80 mg IV 3 times a day. He was discharged 7 days ago and discharge summary indicates his baseline weight is 290-300 pounds. Currently his weight is 298, down from 310 pounds on admission. Of note, last hospitalization patient very uncooperative in obtaining daily weights and monitor intake and output. Would purposefully urinate all over the floor and refused to bear weight for daily weights.   All indications so far have been he intends to do the same for this admission.  Will continue to try but doubtful will get accurate I/O numbers.  -Continue spironolactone -IV Lasix 60 mg as noted above -Daily weights -Intake and output -Continue beta blocker with parameters  #3. Acute on chronic renal failure stage III. Creatinine 1.3. Follow Renal Function.  -Hold nephrotoxins -Monitor given need for IV Lasix -Monitor urine output the best that we can, asked for condom cath  #4. Chronic atrial fibrillation. ChadsVasc 4. On Eliquis. Rate controlled -Continue beta blocker  #5. Hypertension. Controlled in the emergency department. -Continue home meds  #6. Chronic anemia. Hemoglobin 11. This appears to be close to his baseline. Signs symptoms of active bleeding -Monitor  #7. Hyponatremia. Chronic. Sodium 130. Chart review indicates this is close to his baseline. -See above -Monitor  DVT prophylaxis: eliquis  Code Status: full  Family Communication: patient  Disposition Plan: placement for SNF likely in 1-2 days Consults called: social work, wound team  Admission status: inpatient   Subjective: Pt has been sitting up in chair today.  No CP. No SOB.    Objective: Vitals:   11/26/16 0514 11/26/16 1357 11/26/16 2211  11/27/16 0635  BP: 133/77 115/67 (!) 136/52 (!) 120/50  Pulse: 62 72 64 73  Resp: 18 18 18 18   Temp: 97.7 F (36.5 C) 97.6 F (36.4 C) 98 F (36.7  C) 97.9 F (36.6 C)  TempSrc: Oral  Oral Oral  SpO2: 98% 91% 98% 97%  Weight: 135.4 kg (298 lb 9.6 oz)     Height:        Intake/Output Summary (Last 24 hours) at 11/27/16 1335 Last data filed at 11/27/16 1205  Gross per 24 hour  Intake              720 ml  Output              650 ml  Net               70 ml   Filed Weights   11/24/16 1015 11/24/16 1504 11/26/16 0514  Weight: (!) 140.6 kg (310 lb) 133 kg (293 lb 3.4 oz) 135.4 kg (298 lb 9.6 oz)   Exam:  General exam: awake, alert, NAD. Cooperative.  Respiratory system: Clear. No increased work of breathing. Cardiovascular system: S1 & S2 heard, RRR. No JVD, murmurs, gallops, clicks or pedal edema. Gastrointestinal system: Abdomen is nondistended, soft and nontender. Normal bowel sounds heard. Central nervous system: Alert and oriented. No focal neurological deficits. Extremities: severe bilateral diffuse chronic venous stasis - appears slightly improved, less edematous than prior exam   Data Reviewed: Basic Metabolic Panel:  Recent Labs Lab 11/24/16 1110 11/25/16 0531 11/26/16 0559 11/27/16 0545  NA 130* 132* 133* 133*  K 4.3 3.8 3.8 4.1  CL 92* 94* 93* 93*  CO2 28 28 29  34*  GLUCOSE 104* 156* 153* 167*  BUN 34* 34* 30* 26*  CREATININE 1.30* 1.33* 1.46* 1.43*  CALCIUM 9.2 9.0 8.9 9.1   Liver Function Tests:  Recent Labs Lab 11/24/16 1110  AST 23  ALT 19  ALKPHOS 181*  BILITOT 1.4*  PROT 7.1  ALBUMIN 3.2*   No results for input(s): LIPASE, AMYLASE in the last 168 hours. No results for input(s): AMMONIA in the last 168 hours. CBC:  Recent Labs Lab 11/24/16 1110 11/25/16 0531 11/26/16 0559 11/27/16 0545  WBC 9.6 9.4 8.4 8.4  NEUTROABS 7.8*  --  6.7 6.9  HGB 11.8* 11.9* 11.2* 11.7*  HCT 36.7* 37.6* 35.5* 36.6*  MCV 86.6 87.6 88.5 89.7  PLT 360 341 352 366   Cardiac Enzymes: No results for input(s): CKTOTAL, CKMB, CKMBINDEX, TROPONINI in the last 168 hours. CBG (last 3)  No results for  input(s): GLUCAP in the last 72 hours. Recent Results (from the past 240 hour(s))  Blood culture (routine x 2)     Status: None (Preliminary result)   Collection Time: 11/24/16 11:10 AM  Result Value Ref Range Status   Specimen Description BLOOD LEFT ANTECUBITAL  Final   Special Requests   Final    BOTTLES DRAWN AEROBIC AND ANAEROBIC  10CC AEB 5CC ANA   Culture NO GROWTH 2 DAYS  Final   Report Status PENDING  Incomplete  Blood culture (routine x 2)     Status: None (Preliminary result)   Collection Time: 11/24/16 11:20 AM  Result Value Ref Range Status   Specimen Description BLOOD LEFT HAND  Final   Special Requests BOTTLES DRAWN AEROBIC ONLY  10CC  Final   Culture NO GROWTH 2 DAYS  Final   Report Status PENDING  Incomplete    Studies: No results found.  Scheduled Meds: . apixaban  5 mg Oral BID  . atorvastatin  80 mg Oral Daily  . carvedilol  6.25 mg Oral BID WC  . furosemide  60 mg Intravenous TID  . pantoprazole  40 mg Oral BID  . piperacillin-tazobactam (ZOSYN)  IV  3.375 g Intravenous Q8H  . sodium chloride flush  3 mL Intravenous Q12H  . spironolactone  25 mg Oral Daily   Continuous Infusions:  Principal Problem:   Cellulitis Active Problems:   Atrial fibrillation (HCC)   Hypertension   Acute on chronic diastolic CHF (congestive heart failure) (HCC)   Morbid obesity (HCC)   Peripheral vascular disease (HCC)   Chronic venous insufficiency   Chronic anemia   Acute on chronic renal failure (HCC)   Hyponatremia   Cellulitis of lower extremity  Time spent:   Standley Dakins, MD, FAAFP [facility] Pager [phone]  If 7PM-7AM, please contact night-coverage [email or web address] Password [email or web address] 11/27/2016, 1:35 PM    LOS: 3 days

## 2016-11-27 NOTE — Progress Notes (Signed)
Orthopedic Tech Progress Note Patient Details:  Jesus [name] 22-Dec-1945 [id number]  Ortho Devices Type of Ortho Device: Roland RackUnna boot Ortho Device/Splint Location: bi Ortho Device/Splint Interventions: Ordered, Application Applied unna boots to bi legs  [name], Adelbert Gaspard J 11/27/2016, 7:04 AM

## 2016-11-27 NOTE — Progress Notes (Signed)
Pt extremely agitated and demanding of nursing staff to remove his una boots. Pt states they are too tight and hurting and his legs need time to breathe. Pt refusing pain medication. Charge nurse, Asia came to room to speak with pt. Paged Craige CottaKirby, NP and she gave order to remove una boots and day shift rounding MDs can address. Charge nurse, NT and this RN removed Boeinguna boots. Explained to pt the increased risk of getting infection in legs due to them being open and weeping. Pt agreeable now to getting ortho tech to replace una boots not as tight. Pt appreciative of nursing care that he has received by us taking una boots off. Ortho tech to come to room to replace una boots. Will continue to monitor pt. Nelda MarseilleJenny Thacker, RN

## 2016-11-28 LAB — BASIC METABOLIC PANEL
ANION GAP: 11 (ref 5–15)
BUN: 26 mg/dL — ABNORMAL HIGH (ref 6–20)
CALCIUM: 9.6 mg/dL (ref 8.9–10.3)
CO2: 33 mmol/L — AB (ref 22–32)
Chloride: 88 mmol/L — ABNORMAL LOW (ref 101–111)
Creatinine, Ser: 1.6 mg/dL — ABNORMAL HIGH (ref 0.61–1.24)
GFR calc non Af Amer: 42 mL/min — ABNORMAL LOW (ref >60)
GFR, EST AFRICAN AMERICAN: 49 mL/min — AB (ref >60)
Glucose, Bld: 119 mg/dL — ABNORMAL HIGH (ref 65–99)
Potassium: 4.5 mmol/L (ref 3.5–5.1)
Sodium: 132 mmol/L — ABNORMAL LOW (ref 135–145)

## 2016-11-28 LAB — CBC WITH DIFFERENTIAL/PLATELET
BASOS ABS: 0 10*3/uL (ref 0.0–0.1)
BASOS PCT: 0 %
Eosinophils Absolute: 0.3 10*3/uL (ref 0.0–0.7)
Eosinophils Relative: 4 %
HEMATOCRIT: 37 % — AB (ref 39.0–52.0)
HEMOGLOBIN: 11.7 g/dL — AB (ref 13.0–17.0)
Lymphocytes Relative: 7 %
Lymphs Abs: 0.7 10*3/uL (ref 0.7–4.0)
MCH: 27.9 pg (ref 26.0–34.0)
MCHC: 31.6 g/dL (ref 30.0–36.0)
MCV: 88.1 fL (ref 78.0–100.0)
MONOS PCT: 9 %
Monocytes Absolute: 0.9 10*3/uL (ref 0.1–1.0)
NEUTROS ABS: 7.5 10*3/uL (ref 1.7–7.7)
NEUTROS PCT: 80 %
Platelets: 412 10*3/uL — ABNORMAL HIGH (ref 150–400)
RBC: 4.2 MIL/uL — AB (ref 4.22–5.81)
RDW: 14.1 % (ref 11.5–15.5)
WBC: 9.4 10*3/uL (ref 4.0–10.5)

## 2016-11-28 MED ORDER — PRO-STAT SUGAR FREE PO LIQD: 30.0000 mL | Freq: Two times a day (BID) | ORAL | 0 refills | Status: DC

## 2016-11-28 MED ORDER — TORSEMIDE 100 MG PO TABS: 100.0000 mg | ORAL_TABLET | Freq: Two times a day (BID) | ORAL | Status: DC

## 2016-11-28 MED ORDER — AMOXICILLIN-POT CLAVULANATE 875-125 MG PO TABS
1.0000 | ORAL_TABLET | Freq: Two times a day (BID) | ORAL | Status: DC
  Administered 2016-11-28 – 2016-11-29 (×3): 1 via ORAL
  Filled 2016-11-28 (×3): qty 1

## 2016-11-28 MED ORDER — HYDROXYZINE HCL 25 MG PO TABS: 25.0000 mg | ORAL_TABLET | Freq: Three times a day (TID) | ORAL | 0 refills | Status: DC | PRN

## 2016-11-28 MED ORDER — TORSEMIDE 20 MG PO TABS
100.0000 mg | ORAL_TABLET | Freq: Two times a day (BID) | ORAL | Status: DC
  Administered 2016-11-28 – 2016-11-29 (×2): 100 mg via ORAL
  Filled 2016-11-28 (×2): qty 5

## 2016-11-28 MED ORDER — AMOXICILLIN-POT CLAVULANATE 875-125 MG PO TABS: 1.0000 | ORAL_TABLET | Freq: Two times a day (BID) | ORAL | Status: DC

## 2016-11-28 MED ORDER — PRO-STAT SUGAR FREE PO LIQD
30.0000 mL | Freq: Two times a day (BID) | ORAL | Status: DC
  Administered 2016-11-28 – 2016-11-29 (×3): 30 mL via ORAL
  Filled 2016-11-28 (×3): qty 30

## 2016-11-28 MED ORDER — ADULT MULTIVITAMIN W/MINERALS CH
1.0000 | ORAL_TABLET | Freq: Every day | ORAL | Status: DC
  Administered 2016-11-28: 1 via ORAL
  Filled 2016-11-28 (×2): qty 1

## 2016-11-28 MED ORDER — ACETAMINOPHEN 325 MG PO TABS: 650.0000 mg | ORAL_TABLET | Freq: Four times a day (QID) | ORAL | Status: DC | PRN

## 2016-11-28 MED ORDER — PANTOPRAZOLE SODIUM 40 MG PO TBEC: 40.0000 mg | DELAYED_RELEASE_TABLET | Freq: Two times a day (BID) | ORAL | Status: DC

## 2016-11-28 NOTE — Progress Notes (Signed)
Physical Therapy Treatment Patient Details Name: Jesus [name] MRN: [id number] DOB: 05-Nov-1946 Today's Date: 11/28/2016    History of Present Illness Pt is a 70 y/o male admitted secondary to bilateral LE cellulitis. Of note, pt recently admitted on 12/7 secondary to worsening bilateral LE edema. PMH including but not limited to chronic A-fib, CHF and HTN.    PT Comments    Pt performed decreased mobility and required increased assist to achieve standing secondary to pain.  Pt reports 10/10 pain in B feet and LEs from unna boots.  Pt reports if he could go home and take 2 advil and drink a 40 oz he would be okay.  PTA educated on the benefits of mobility and healing processes.  Pt requesting to speak to MD.  RN reports she will page the MD.     Follow Up Recommendations  SNF;Supervision/Assistance - 24 hour     Equipment Recommendations  None recommended by PT;Other (comment) (defer to next venue)    Recommendations for Other Services       Precautions / Restrictions Precautions Precautions: Fall Restrictions Weight Bearing Restrictions: No    Mobility  Bed Mobility               General bed mobility comments: pt sitting OOB in recliner when PT entered room, patient with saturated towels beneath him from urinary incontinence.  He reports he puts towels in the floor at home when sitting in the chair.    Transfers Overall transfer level: Needs assistance Equipment used: Rolling walker (2 wheeled) (bariatric.  ) Transfers: Sit to/from Stand Sit to Stand: Max assist (+3 from lower recliner seat.  Pt denies strength deficit and reports he is unable to stand secondary to pain.  )         General transfer comment: pt required increased time, VC'ing for bilateral hand placement and max A +3 to power up from recliner to achieve flexed forward standing position. Min A +2 to maintain standing.  Upon standing NT removed soiled chuck pads.     Ambulation/Gait Ambulation/Gait assistance:  (Pt unable to advance to gait secondary due to pain.  )               Stairs            Wheelchair Mobility    Modified Rankin (Stroke Patients Only)       Balance     Sitting balance-Leahy Scale: Good       Standing balance-Leahy Scale: Poor Standing balance comment: pt reliant on bilateral UEs on RW, with flexed posture over RW and forward flexed posturing.                      Cognition Arousal/Alertness: Awake/alert Behavior During Therapy: WFL for tasks assessed/performed Overall Cognitive Status: Difficult to assess                 General Comments: Pt required repeated rationale for the benefits of mobility.  Pt reports he does not understand why this is necessary.      Exercises      General Comments        Pertinent Vitals/Pain Pain Assessment: 0-10 Pain Score: 10-Worst pain ever Pain Location: B LEs in standing with weight bearing.   Pain Descriptors / Indicators: Aching;Burning;Grimacing;Guarding;Moaning;Crying;Sore Pain Intervention(s): Monitored during session;Repositioned (Pt refused to elevate LEs.  )    Home Living  Prior Function            PT Goals (current goals can now be found in the care plan section) Acute Rehab PT Goals Patient Stated Goal: decrease pain Potential to Achieve Goals: Good Progress towards PT goals: Progressing toward goals    Frequency    Min 2X/week      PT Plan Current plan remains appropriate    Co-evaluation             End of Session Equipment Utilized During Treatment: Gait belt Activity Tolerance: Patient limited by fatigue Patient left: in chair;with call bell/phone within reach;with chair alarm set;with family/visitor present     Time: 1456-1535 PT Time Calculation (min) (ACUTE ONLY): 39 min  Charges:  $Therapeutic Activity: 38-52 mins                    G Codes:      Florestine Aversimee J  Aubrey Voong 11/28/2016, 3:58 PM  Joycelyn RuaAimee Danali Marinos, PTA pager [phone]

## 2016-11-28 NOTE — Care Management Important Message (Signed)
Important Message  Patient Details  Name: Jesus [name] MRN: [id number] Date of Birth: 09/30/1946   Medicare Important Message Given:  Yes    Janyah Singleterry Abena 11/28/2016, 11:00 AM

## 2016-11-28 NOTE — Discharge Summary (Signed)
Physician Discharge Summary  [name] FAO:[id number] DOB: January 13, 1946 DOA: 11/24/2016  PCP: [name],[name], [name]  Admit date: 11/24/2016 Discharge date: 11/28/2016   Recommendations for Outpatient Follow-Up:   1. BMP 1 week 2. Daily weights 3. augmentin until 12/27 4.   bilat Una boots and coban and change Q Mon and Thurs PRELIMINARY D/C-- will need to be updated in AM    Discharge Diagnosis:   Principal Problem:   Cellulitis Active Problems:   Atrial fibrillation (HCC)   Hypertension   Acute on chronic diastolic CHF (congestive heart failure) (HCC)   Morbid obesity (HCC)   Peripheral vascular disease (HCC)   Chronic venous insufficiency   Chronic anemia   Acute on chronic renal failure (HCC)   Hyponatremia   Cellulitis of lower extremity   Discharge disposition:  SNF:  Discharge Condition: Improved.  Diet recommendation: Low sodium, heart healthy.    Wound care: None.   History of Present Illness:   Jesus [name] is a 70 y.o. male with medical history significant for chronic congestive heart failure, hypertension, chronic A. fib, chronic lower extremity edema/wound, peripheral vascular disease venous stasis ulcers presents to the emergency Department chief complaint of worsening pain bilateral lower extremity particular around the right. Initial evaluation reveals persistent and worsening lower extremity edema and erythema concerning for cellulitis as well as on elevated BNP, acute on chronic kidney disease.  Information is obtained from the chart and the patient however the patient is a very historian. He was discharged on the hospital 7 days ago after a 4 day admission for the same. He was discharged on doxycycline is unsure if he completed the antibiotic. He denies fever chills worsening shortness of breath. He states he stays "in a recliner". He denies dysuria hematuria frequency or urgency. He denies abdominal pain nausea vomiting diarrhea  constipation melena   Hospital Course by Problem:   Chronic bilateral lower extremity wounds related to cellulitis and chronic poorly managed venous stasis in the setting of acute on chronic diastolic heart failure Evaluated by wound team who opined both legs are worse since his discharge 6 days PTA. Discharged with doxycycline unsure if he finished or if he was taking it.  In addition, there is concern that his home situation is unsafe and not conducive to him getting any better.  Patient reports increased pain and wounds appear worse [facility]. Patient is afebrile nontoxic appearing lactic acid within the limits of normal. BNP 247. - blood cultures, NGTD -change IV abx to PO today and plan to d/c to SNF in AM -IV Lasix 60 mg TID-- change back to home PO dose of 100 TID of torosemide -Unna boots per wound care  -Working on skilled nursing placement for patient to have any meaningful improvement or chance of recovery -SNF in AM-- d/c summary pended to expedite d/c in AM   Acute on chronic diastolic heart failure. Likely noncompliant with diet and medications. Echo 2016 with an EF of 55%. Meds include spironolactone. Recently hospitalized for the same at which time he received Lasix 80 mg IV 3 times a day. He was discharged 7 days ago and discharge summary indicates his baseline weight is 290-300 pounds. Currently his weight is 300, down from 310 pounds on admission.Of note, last hospitalization patient very uncooperative in obtaining daily weights and monitor intake and output. Would purposefully urinate all over the floor and refused to bear weight for daily weights.   All indications so  far have been he intends to do the same for this admission.  Will continue to try but doubtful will get accurate I/O numbers.  -not compliant with diet-  -resume home meds -Daily weights -Intake and output -Continue beta blocker with parameters  Acute on chronic renal  failure stage III.  -BMP in AM   Chronic atrial fibrillation. ChadsVasc 4. On Eliquis. Rate controlled -Continue beta blocker   Hypertension. Controlled in the emergency department. -Continue home meds  Chronic anemia. Hemoglobin 11. This appears to be close to his baseline. Signs symptoms of active bleeding -Monitor  Hyponatremia. Chronic. Sodium 130. Chart review indicates this is close to his baseline. -See above -Monitor     Medical Consultants:    None.   Discharge Exam:   Vitals:   11/27/16 2118 11/28/16 0556  BP: 121/60 133/83  Pulse: 65 67  Resp: 17 18  Temp: 98 F (36.7 C) 97.5 F (36.4 C)   Vitals:   11/27/16 1505 11/27/16 2118 11/28/16 0500 11/28/16 0556  BP: 125/66 121/60  133/83  Pulse: 70 65  67  Resp: 20 17  18   Temp: 98.1 F (36.7 C) 98 F (36.7 C)  97.5 F (36.4 C)  TempSrc: Oral Oral  Oral  SpO2: 100% 99%  94%  Weight:   136.1 kg (300 lb)   Height:        Gen:  NAD   The results of significant diagnostics from this hospitalization (including imaging, microbiology, ancillary and laboratory) are listed below for reference.     Procedures and Diagnostic Studies:   No results found.   Labs:   Basic Metabolic Panel:  Recent Labs Lab 11/24/16 1110 11/25/16 0531 11/26/16 0559 11/27/16 0545 11/28/16 0627  NA 130* 132* 133* 133* 132*  K 4.3 3.8 3.8 4.1 4.5  CL 92* 94* 93* 93* 88*  CO2 28 28 29  34* 33*  GLUCOSE 104* 156* 153* 167* 119*  BUN 34* 34* 30* 26* 26*  CREATININE 1.30* 1.33* 1.46* 1.43* 1.60*  CALCIUM 9.2 9.0 8.9 9.1 9.6   GFR Estimated Creatinine Clearance: 59.7 mL/min (by C-G formula based on SCr of 1.6 mg/dL (H)). Liver Function Tests:  Recent Labs Lab 11/24/16 1110  AST 23  ALT 19  ALKPHOS 181*  BILITOT 1.4*  PROT 7.1  ALBUMIN 3.2*   No results for input(s): LIPASE, AMYLASE in the last 168 hours. No results for input(s): AMMONIA in the last 168 hours. Coagulation profile No results for  input(s): INR, PROTIME in the last 168 hours.  CBC:  Recent Labs Lab 11/24/16 1110 11/25/16 0531 11/26/16 0559 11/27/16 0545 11/28/16 0627  WBC 9.6 9.4 8.4 8.4 9.4  NEUTROABS 7.8*  --  6.7 6.9 7.5  HGB 11.8* 11.9* 11.2* 11.7* 11.7*  HCT 36.7* 37.6* 35.5* 36.6* 37.0*  MCV 86.6 87.6 88.5 89.7 88.1  PLT 360 341 352 366 412*   Cardiac Enzymes: No results for input(s): CKTOTAL, CKMB, CKMBINDEX, TROPONINI in the last 168 hours. BNP: Invalid input(s): POCBNP CBG: No results for input(s): GLUCAP in the last 168 hours. D-Dimer No results for input(s): DDIMER in the last 72 hours. Hgb A1c No results for input(s): HGBA1C in the last 72 hours. Lipid Profile No results for input(s): CHOL, HDL, LDLCALC, TRIG, CHOLHDL, LDLDIRECT in the last 72 hours. Thyroid function studies No results for input(s): TSH, T4TOTAL, T3FREE, THYROIDAB in the last 72 hours.  Invalid input(s): FREET3 Anemia work up No results for input(s): VITAMINB12, FOLATE, FERRITIN, TIBC, IRON, RETICCTPCT  in the last 72 hours. Microbiology Recent Results (from the past 240 hour(s))  Blood culture (routine x 2)     Status: None (Preliminary result)   Collection Time: 11/24/16 11:10 AM  Result Value Ref Range Status   Specimen Description BLOOD LEFT ANTECUBITAL  Final   Special Requests   Final    BOTTLES DRAWN AEROBIC AND ANAEROBIC  10CC AEB 5CC ANA   Culture NO GROWTH 4 DAYS  Final   Report Status PENDING  Incomplete  Blood culture (routine x 2)     Status: None (Preliminary result)   Collection Time: 11/24/16 11:20 AM  Result Value Ref Range Status   Specimen Description BLOOD LEFT HAND  Final   Special Requests BOTTLES DRAWN AEROBIC ONLY  10CC  Final   Culture NO GROWTH 4 DAYS  Final   Report Status PENDING  Incomplete     Discharge Instructions:   Discharge Instructions    (HEART FAILURE PATIENTS) Call MD:  Anytime you have any of the following symptoms: 1) 3 pound weight gain in 24 hours or 5 pounds in 1  week 2) shortness of breath, with or without a dry hacking cough 3) swelling in the hands, feet or stomach 4) if you have to sleep on extra pillows at night in order to breathe.    Complete by:  As directed    Diet - low sodium heart healthy    Complete by:  As directed    Increase activity slowly    Complete by:  As directed      Allergies as of 11/28/2016      Reactions   Amlodipine Besylate Swelling, Other (See Comments)   "started off low and ended up severe; if, in fact, that is what's causing the swelling" (06/03/12)      Medication List    STOP taking these medications   doxycycline 100 MG tablet Commonly known as:  VIBRA-TABS   potassium chloride SA 20 MEQ tablet Commonly known as:  K-DUR,KLOR-CON     TAKE these medications   acetaminophen 325 MG tablet Commonly known as:  TYLENOL Take 2 tablets (650 mg total) by mouth every 6 (six) hours as needed for mild pain (or Fever >/= 101).   amoxicillin-clavulanate 875-125 MG tablet Commonly known as:  AUGMENTIN Take 1 tablet by mouth every 12 (twelve) hours.   apixaban 5 MG Tabs tablet Commonly known as:  ELIQUIS Take 1 tablet (5 mg total) by mouth 2 (two) times daily.   atorvastatin 80 MG tablet Commonly known as:  LIPITOR Take 1 tablet (80 mg total) by mouth daily.   carvedilol 6.25 MG tablet Commonly known as:  COREG Take 1 tablet (6.25 mg total) by mouth 2 (two) times daily with a meal.   feeding supplement (PRO-STAT SUGAR FREE 64) Liqd Take 30 mLs by mouth 2 (two) times daily.   hydrOXYzine 25 MG tablet Commonly known as:  ATARAX/VISTARIL Take 1 tablet (25 mg total) by mouth 3 (three) times daily as needed (for itching).   pantoprazole 40 MG tablet Commonly known as:  PROTONIX Take 1 tablet (40 mg total) by mouth 2 (two) times daily.   saccharomyces boulardii 250 MG capsule Commonly known as:  FLORASTOR Take 1 capsule (250 mg total) by mouth 2 (two) times daily.   spironolactone 25 MG tablet Commonly  known as:  ALDACTONE Take 1 tablet (25 mg total) by mouth daily. Start 8/11   torsemide 100 MG tablet Commonly known as:  DEMADEX Take  1 tablet (100 mg total) by mouth 2 (two) times daily.      Contact information for after-discharge care    Destination    [facility] .   Specialty:  Skilled Nursing Facility Contact information: [address] [place] [id number] [phone]               Time coordinating discharge: 35 min  Signed:  Alyas Creary Juanetta GoslingU Braylynn Lewing   [facility] 11/28/2016, 1:45 PM

## 2016-11-28 NOTE — Progress Notes (Signed)
PROGRESS NOTE    Jesus [name]  ZOX:[id number]  DOB: [birth year]  DOA: 11/24/2016 PCP: [name],[name], [name] Outpatient Specialists:   Hospital course:  70 y.o. male with medical history significant for chronic congestive heart failure, hypertension, chronic A. fib, chronic lower extremity edema/wound, peripheral vascular disease venous stasis ulcers presents to the emergency Department chief complaint of worsening pain bilateral lower extremity particular around the right. Initial evaluation reveals persistent and worsening lower extremity edema and erythema concerning for cellulitis as well as on elevated BNP, acute on chronic kidney disease.  Assessment & Plan:   Chronic bilateral lower extremity wounds related to cellulitis and chronic poorly managed venous stasis in the setting of acute on chronic diastolic heart failure Evaluated by wound team who opined both legs are worse since his discharge 6 days PTA. Discharged with doxycycline unsure if he finished or if he was taking it.  In addition, there is concern that his home situation is unsafe and not conducive to him getting any better.  Patient reports increased pain and wounds appear worse from [facility]. Patient is afebrile nontoxic appearing lactic acid within the limits of normal. BNP 247. - blood cultures, NGTD -change IV abx to PO today and plan to d/c to SNF in AM -IV Lasix 60 mg TID-- change back to home PO dose of 100 TID of torosemide -Unna boots per wound care  -Working on skilled nursing placement for patient to have any meaningful improvement or chance of recovery -SNF in AM-- d/c summary done to expedite d/c in AM   Acute on chronic diastolic heart failure. Likely noncompliant with diet and medications. Echo 2016 with an EF of 55%. Meds include spironolactone. Recently hospitalized for the same at which time he received Lasix 80 mg IV 3 times a day. He was discharged 7 days ago and discharge  summary indicates his baseline weight is 290-300 pounds. Currently his weight is 300, down from 310 pounds on admission. Of note, last hospitalization patient very uncooperative in obtaining daily weights and monitor intake and output. Would purposefully urinate all over the floor and refused to bear weight for daily weights.   All indications so far have been he intends to do the same for this admission.  Will continue to try but doubtful will get accurate I/O numbers.  -not compliant with diet-  -resume home meds -Daily weights -Intake and output -Continue beta blocker with parameters  Acute on chronic renal failure stage III.  -BMP in AM   Chronic atrial fibrillation. ChadsVasc 4. On Eliquis. Rate controlled -Continue beta blocker   Hypertension. Controlled in the emergency department. -Continue home meds  Chronic anemia. Hemoglobin 11. This appears to be close to his baseline. Signs symptoms of active bleeding -Monitor  Hyponatremia. Chronic. Sodium 130. Chart review indicates this is close to his baseline. -See above -Monitor  DVT prophylaxis: eliquis  Code Status: full  Family Communication: patient  Disposition Plan: SNF in AM if Cr stable Consults called: social work, wound team  Admission status: inpatient   Subjective: Legs dangling in chair   Objective: Vitals:   11/27/16 1505 11/27/16 2118 11/28/16 0500 11/28/16 0556  BP: 125/66 121/60  133/83  Pulse: 70 65  67  Resp: 20 17  18   Temp: 98.1 F (36.7 C) 98 F (36.7 C)  97.5 F (36.4 C)  TempSrc: Oral Oral  Oral  SpO2: 100% 99%  94%  Weight:   136.1 kg (300 lb)   Height:  Intake/Output Summary (Last 24 hours) at 11/28/16 1320 Last data filed at 11/27/16 1717  Gross per 24 hour  Intake              240 ml  Output                0 ml  Net              240 ml   Filed Weights   11/24/16 1504 11/26/16 0514 11/28/16 0500  Weight: 133 kg (293 lb 3.4 oz) 135.4 kg (298 lb 9.6 oz) 136.1 kg (300  lb)   Exam:  General exam: awake, alert, NAD. Cooperative.  Respiratory system: Clear. No increased work of breathing. Cardiovascular system: S1 & S2 heard, RRR. No JVD, murmurs, gallops, clicks or pedal edema. Gastrointestinal system: Abdomen is nondistended, soft and nontender. Normal bowel sounds heard. Central nervous system: Alert and oriented. No focal neurological deficits. Extremities: severe bilateral diffuse chronic venous stasis   Data Reviewed: Basic Metabolic Panel:  Recent Labs Lab 11/24/16 1110 11/25/16 0531 11/26/16 0559 11/27/16 0545 11/28/16 0627  NA 130* 132* 133* 133* 132*  K 4.3 3.8 3.8 4.1 4.5  CL 92* 94* 93* 93* 88*  CO2 28 28 29  34* 33*  GLUCOSE 104* 156* 153* 167* 119*  BUN 34* 34* 30* 26* 26*  CREATININE 1.30* 1.33* 1.46* 1.43* 1.60*  CALCIUM 9.2 9.0 8.9 9.1 9.6   Liver Function Tests:  Recent Labs Lab 11/24/16 1110  AST 23  ALT 19  ALKPHOS 181*  BILITOT 1.4*  PROT 7.1  ALBUMIN 3.2*   No results for input(s): LIPASE, AMYLASE in the last 168 hours. No results for input(s): AMMONIA in the last 168 hours. CBC:  Recent Labs Lab 11/24/16 1110 11/25/16 0531 11/26/16 0559 11/27/16 0545 11/28/16 0627  WBC 9.6 9.4 8.4 8.4 9.4  NEUTROABS 7.8*  --  6.7 6.9 7.5  HGB 11.8* 11.9* 11.2* 11.7* 11.7*  HCT 36.7* 37.6* 35.5* 36.6* 37.0*  MCV 86.6 87.6 88.5 89.7 88.1  PLT 360 341 352 366 412*   Cardiac Enzymes: No results for input(s): CKTOTAL, CKMB, CKMBINDEX, TROPONINI in the last 168 hours. CBG (last 3)  No results for input(s): GLUCAP in the last 72 hours. Recent Results (from the past 240 hour(s))  Blood culture (routine x 2)     Status: None (Preliminary result)   Collection Time: 11/24/16 11:10 AM  Result Value Ref Range Status   Specimen Description BLOOD LEFT ANTECUBITAL  Final   Special Requests   Final    BOTTLES DRAWN AEROBIC AND ANAEROBIC  10CC AEB 5CC ANA   Culture NO GROWTH 4 DAYS  Final   Report Status PENDING  Incomplete    Blood culture (routine x 2)     Status: None (Preliminary result)   Collection Time: 11/24/16 11:20 AM  Result Value Ref Range Status   Specimen Description BLOOD LEFT HAND  Final   Special Requests BOTTLES DRAWN AEROBIC ONLY  10CC  Final   Culture NO GROWTH 4 DAYS  Final   Report Status PENDING  Incomplete    Studies: No results found.  Scheduled Meds: . amoxicillin-clavulanate  1 tablet Oral Q12H  . apixaban  5 mg Oral BID  . atorvastatin  80 mg Oral Daily  . carvedilol  6.25 mg Oral BID WC  . feeding supplement (PRO-STAT SUGAR FREE 64)  30 mL Oral BID  . multivitamin with minerals  1 tablet Oral Daily  . pantoprazole  40  mg Oral BID  . sodium chloride flush  3 mL Intravenous Q12H  . spironolactone  25 mg Oral Daily  . torsemide  100 mg Oral BID   Continuous Infusions:  Principal Problem:   Cellulitis Active Problems:   Atrial fibrillation (HCC)   Hypertension   Acute on chronic diastolic CHF (congestive heart failure) (HCC)   Morbid obesity (HCC)   Peripheral vascular disease (HCC)   Chronic venous insufficiency   Chronic anemia   Acute on chronic renal failure (HCC)   Hyponatremia   Cellulitis of lower extremity  Time spent: 35 min  Jesus Vercher U Tameia Rafferty, DO [facility]   If 7PM-7AM, please contact night-coverage [email or web address] Password [email or web address] 11/28/2016, 1:20 PM    LOS: 4 days

## 2016-11-28 NOTE — Progress Notes (Signed)
[facility]  Facility to complete paperwork with patient this afternoon and requests DC summary today so they can get medications (DC summary sent)  CSW will continue to follow- will need to send updated DC summary tomorrow on day of DC  Burna SisJenna H. Jem Castro, LCSW Clinical Social Worker [phone]

## 2016-11-28 NOTE — [place] FL2 (Signed)
[place] MEDICAID FL2 LEVEL OF CARE SCREENING TOOL     IDENTIFICATION  Patient Name: Jesus MooresDouglas M Bader Birthdate: 06/17/1946 Sex: male Admission Date (Current Location): 11/24/2016  [facility] [place]   Facility and Address:  [facility], [address], [place], [place] [address]      Provider Number: [id number]  Attending Physician Name and Address:  Joseph ArtJessica U Vann, DO  Relative Name and Phone Number:       Current Level of Care: Hospital Recommended Level of Care: Skilled Nursing Facility Prior Approval Number:    Date Approved/Denied:   PASRR Number: [id number]  Discharge Plan: SNF    Current Diagnoses: Patient Active Problem List   Diagnosis Date Noted  . Cellulitis of lower extremity   . Hyponatremia 07/15/2015  . Cellulitis of right lower extremity 07/15/2015  . Acute on chronic renal failure (HCC)   . Hypokalemia   . Chronic atrial fibrillation (HCC) 07/09/2015  . Chronic anemia 07/09/2015  . CHF exacerbation (HCC) 07/08/2015  . Chronic diastolic CHF (congestive heart failure) (HCC) 04/02/2015  . Chronic diastolic heart failure (HCC) 01/25/2015  . Cellulitis 01/17/2015  . Pulmonary vascular congestion   . Congestive heart disease (HCC)   . Peripheral edema   . Acute exacerbation of CHF (congestive heart failure) (HCC) 01/04/2015  . 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 Renal artery stenosis (HCC) 02/17/2014  . Peripheral vascular disease (HCC) 04/15/2013  . Chronic venous insufficiency 04/15/2013  . Morbid obesity (HCC) 06/11/2012  . Acute on chronic diastolic CHF (congestive heart failure) (HCC) 06/02/2012  . Unspecified essential hypertension 05/18/2012  . Atrial fibrillation (HCC) 04/27/2012  . CHF (congestive heart failure) (HCC) 04/27/2012  . Hypertension 04/27/2012    Orientation  RESPIRATION BLADDER Height & Weight     Self, Time, Situation, Place  Normal Incontinent Weight: 300 lb (136.1 kg) (pt refused to be weighed; stated he is 300 lb.) Height:  5\' 10"  (177.8 cm)  BEHAVIORAL SYMPTOMS/MOOD NEUROLOGICAL BOWEL NUTRITION STATUS      Continent Diet (see DC summary)  AMBULATORY STATUS COMMUNICATION OF NEEDS Skin   Limited Assist Verbally Normal                       Personal Care Assistance Level of Assistance  Bathing, Dressing Bathing Assistance: Limited assistance   Dressing Assistance: Limited assistance     Functional Limitations Info             SPECIAL CARE FACTORS FREQUENCY  PT (By licensed PT), OT (By licensed OT)     PT Frequency: 5/wk OT Frequency: 5/wk            Contractures      Additional Factors Info  Code Status, Allergies Code Status Info: FULL Allergies Info: Amlodipine Besylate           Current Medications (11/28/2016):  This is the current hospital active medication list Current Facility-Administered Medications  Medication Dose Route Frequency Provider Last Rate Last Dose  . 0.9 %  sodium chloride infusion  250 mL Intravenous PRN Gwenyth BenderKaren M Black, NP      . acetaminophen (TYLENOL) tablet 650 mg  650 mg Oral Q6H PRN Gwenyth BenderKaren M Black, NP       Or  . acetaminophen (TYLENOL) suppository 650 mg  650 mg Rectal Q6H PRN Gwenyth BenderKaren M Black, NP      .  amoxicillin-clavulanate (AUGMENTIN) 875-125 MG per tablet 1 tablet  1 tablet Oral Q12H Joseph ArtJessica U Vann, DO   1 tablet at 11/28/16 1207  . apixaban (ELIQUIS) tablet 5 mg  5 mg Oral BID Gwenyth BenderKaren M Black, NP   5 mg at 11/28/16 0848  . atorvastatin (LIPITOR) tablet 80 mg  80 mg Oral Daily Gwenyth BenderKaren M Black, NP   80 mg at 11/27/16 1715  . carvedilol (COREG) tablet 6.25 mg  6.25 mg Oral BID WC Gwenyth BenderKaren M Black, NP   6.25 mg at 11/28/16 0848  . feeding supplement (PRO-STAT SUGAR FREE 64) liquid 30 mL  30 mL Oral BID Joseph ArtJessica U Vann, DO   30 mL at 11/28/16 1207  . HYDROcodone-acetaminophen  (NORCO/VICODIN) 5-325 MG per tablet 1-2 tablet  1-2 tablet Oral Q4H PRN Gwenyth BenderKaren M Black, NP   2 tablet at 11/28/16 0343  . hydrOXYzine (ATARAX/VISTARIL) tablet 25 mg  25 mg Oral TID PRN [place] Cyndie MullL Johnson, MD   25 mg at 11/26/16 1226  . multivitamin with minerals tablet 1 tablet  1 tablet Oral Daily Joseph ArtJessica U Vann, DO   1 tablet at 11/28/16 1207  . ondansetron (ZOFRAN) tablet 4 mg  4 mg Oral Q6H PRN Gwenyth BenderKaren M Black, NP       Or  . ondansetron [facility](ZOFRAN) injection 4 mg  4 mg Intravenous Q6H PRN Gwenyth BenderKaren M Black, NP      . pantoprazole (PROTONIX) EC tablet 40 mg  40 mg Oral BID Gwenyth BenderKaren M Black, NP   40 mg at 11/28/16 0848  . sodium chloride flush (NS) 0.9 % injection 3 mL  3 mL Intravenous Q12H Gwenyth BenderKaren M Black, NP   3 mL at 11/28/16 0849  . spironolactone (ALDACTONE) tablet 25 mg  25 mg Oral Daily Gwenyth BenderKaren M Black, NP   25 mg at 11/28/16 0848  . torsemide (DEMADEX) tablet 100 mg  100 mg Oral BID Joseph ArtJessica U Vann, DO         Discharge Medications: Please see discharge summary for a list of discharge medications.  Relevant Imaging Results:  Relevant Lab Results:   Additional Information SS#: [id number]  Burna SisUris, Amahd Morino H, LCSW

## 2016-11-28 NOTE — Progress Notes (Signed)
Initial Nutrition Assessment  DOCUMENTATION CODES:   Morbid obesity  INTERVENTION:  Provide healthful snacks TID Provide 30 ml pro-stat BID, each dose provides 15 grams of protein and 100 kcal Multivitamin with minerals daily Encourage healthful eating   NUTRITION DIAGNOSIS:   Increased nutrient needs related to wound healing as evidenced by estimated needs.   GOAL:   Patient will meet greater than or equal to 90% of their needs   MONITOR:   PO intake, Skin, I & O's, Labs, Weight trends  REASON FOR ASSESSMENT:   Consult Assessment of nutrition requirement/status  ASSESSMENT:   70 y.o. male with medical history significant for chronic congestive heart failure, hypertension, chronic A. fib, chronic lower extremity edema/wound, peripheral vascular disease venous stasis ulcers presents to the emergency Department chief complaint of worsening pain bilateral lower extremity particular around the right. Initial evaluation reveals persistent and worsening lower extremity edema and erythema concerning for cellulitis as well as on elevated BNP, acute on chronic kidney disease.  Pt states that his appetite is good and he is eating well, but doesn't feel he is getting enough and would like to receive snacks. He is obese and well nourished. He reports a usual body weight of 280 to 290 lbs. RD discussed the importance of a healthful diet. Offered patient healthful snacks and he is agreeable.   Labs: low sodium, low chloride, low hemoglobin  Diet Order:  Diet Heart Room service appropriate? Yes; Fluid consistency: Thin  Skin:  Wound (see comment) (Cellulitis on lower legs; MSAD on legs and buttocks)  Last BM:  12/17  Height:   Ht Readings from Last 1 Encounters:  11/24/16 5\' 10"  (1.778 m)    Weight:   Wt Readings from Last 1 Encounters:  11/28/16 300 lb (136.1 kg)    Ideal Body Weight:  75.5 kg  BMI:  Body mass index is 43.05 kg/m.  Estimated Nutritional Needs:    Kcal:  2000-2200  Protein:  100-115 grams  Fluid:  per MD  EDUCATION NEEDS:   No education needs identified at this time  Dorothea Ogleeanne Alleigh Mollica RD, CSP, LDN Inpatient Clinical Dietitian Pager: [phone] After Hours Pager: [phone]

## 2016-11-29 DIAGNOSIS — R278 Other lack of coordination: Secondary | ICD-10-CM | POA: Diagnosis not present

## 2016-11-29 DIAGNOSIS — I5033 Acute on chronic diastolic (congestive) heart failure: Secondary | ICD-10-CM

## 2016-11-29 DIAGNOSIS — M79606 Pain in leg, unspecified: Secondary | ICD-10-CM | POA: Diagnosis not present

## 2016-11-29 DIAGNOSIS — L03116 Cellulitis of left lower limb: Secondary | ICD-10-CM | POA: Diagnosis not present

## 2016-11-29 DIAGNOSIS — N183 Chronic kidney disease, stage 3 (moderate): Secondary | ICD-10-CM | POA: Diagnosis not present

## 2016-11-29 DIAGNOSIS — L03115 Cellulitis of right lower limb: Secondary | ICD-10-CM | POA: Diagnosis not present

## 2016-11-29 DIAGNOSIS — E871 Hypo-osmolality and hyponatremia: Secondary | ICD-10-CM | POA: Diagnosis not present

## 2016-11-29 DIAGNOSIS — K219 Gastro-esophageal reflux disease without esophagitis: Secondary | ICD-10-CM | POA: Diagnosis not present

## 2016-11-29 DIAGNOSIS — L03119 Cellulitis of unspecified part of limb: Secondary | ICD-10-CM | POA: Diagnosis not present

## 2016-11-29 DIAGNOSIS — I5022 Chronic systolic (congestive) heart failure: Secondary | ICD-10-CM | POA: Diagnosis not present

## 2016-11-29 DIAGNOSIS — I739 Peripheral vascular disease, unspecified: Secondary | ICD-10-CM

## 2016-11-29 DIAGNOSIS — R600 Localized edema: Secondary | ICD-10-CM | POA: Diagnosis not present

## 2016-11-29 DIAGNOSIS — R1310 Dysphagia, unspecified: Secondary | ICD-10-CM | POA: Diagnosis not present

## 2016-11-29 DIAGNOSIS — I872 Venous insufficiency (chronic) (peripheral): Secondary | ICD-10-CM | POA: Diagnosis not present

## 2016-11-29 DIAGNOSIS — M6281 Muscle weakness (generalized): Secondary | ICD-10-CM | POA: Diagnosis not present

## 2016-11-29 DIAGNOSIS — R531 Weakness: Secondary | ICD-10-CM | POA: Diagnosis not present

## 2016-11-29 DIAGNOSIS — R41841 Cognitive communication deficit: Secondary | ICD-10-CM | POA: Diagnosis not present

## 2016-11-29 DIAGNOSIS — I480 Paroxysmal atrial fibrillation: Secondary | ICD-10-CM | POA: Diagnosis not present

## 2016-11-29 LAB — CULTURE, BLOOD (ROUTINE X 2)
CULTURE: NO GROWTH
Culture: NO GROWTH

## 2016-11-29 LAB — BASIC METABOLIC PANEL
Anion gap: 13 (ref 5–15)
BUN: 35 mg/dL — AB (ref 6–20)
CALCIUM: 9.4 mg/dL (ref 8.9–10.3)
CO2: 28 mmol/L (ref 22–32)
Chloride: 90 mmol/L — ABNORMAL LOW (ref 101–111)
Creatinine, Ser: 1.65 mg/dL — ABNORMAL HIGH (ref 0.61–1.24)
GFR calc Af Amer: 47 mL/min — ABNORMAL LOW (ref >60)
GFR, EST NON AFRICAN AMERICAN: 40 mL/min — AB (ref >60)
GLUCOSE: 116 mg/dL — AB (ref 65–99)
Potassium: 4.1 mmol/L (ref 3.5–5.1)
SODIUM: 131 mmol/L — AB (ref 135–145)

## 2016-11-29 NOTE — Clinical Social Work Placement (Signed)
   CLINICAL SOCIAL WORK PLACEMENT  NOTE  Date:  11/29/2016  Patient Details  Name: Jesus MooresDouglas M Siller MRN: [id number] Date of Birth: [birth year]  Clinical Social Work is seeking post-discharge placement for this patient at the Skilled  Nursing Facility level of care (*CSW will initial, date and re-position this form in  chart as items are completed):  Yes   Patient/family provided with [facility] Clinical Social Work Department's list of facilities offering this level of care within the geographic area requested by the patient (or if unable, by the patient's family).  Yes   Patient/family informed of their freedom to choose among providers that offer the needed level of care, that participate in Medicare, Medicaid or managed care program needed by the patient, have an available bed and are willing to accept the patient.  Yes   Patient/family informed of 's ownership interest in [facility] and [facility], as well as of the fact that they are under no obligation to receive care at these facilities.  PASRR submitted to EDS on 11/26/16     PASRR number received on 11/26/16     Existing PASRR number confirmed on       FL2 transmitted to all facilities in geographic area requested by pt/family on 11/26/16     FL2 transmitted to all facilities within larger geographic area on       Patient informed that his/her managed care company has contracts with or will negotiate with certain facilities, including the following:        Yes   Patient/family informed of bed offers received.  [facility]     Physician recommends and patient chooses bed at      [facility].  Patient to be transferred to facility by [facility]     Patient family notified on 11/29/16 of transfer.  Name of family member notified:  [name] (Left voicemail)     PHYSICIAN Please prepare prescriptions     Additional Comment:     _______________________________________________ Margarito LinerSarah C Jatziri Goffredo, LCSW 11/29/2016, 10:00 AM

## 2016-11-29 NOTE — Clinical Social Work Note (Signed)
CSW facilitated patient discharge including contacting patient family and facility to confirm patient discharge plans. Clinical information faxed to facility and family agreeable with plan. CSW arranged ambulance transport via [facility] at 11:00 am. RN to call report prior to discharge [phone]).  CSW will sign off for now as social work intervention is no longer needed. Please consult us again if new needs arise.  Charlynn CourtSarah Osiah Haring, CSW [phone]

## 2016-11-29 NOTE — Progress Notes (Signed)
report given to Reuel Boomaniel, receiving nurse at [facility]. All questions answered. Pt will be transferred via [facility].

## 2016-11-29 NOTE — Discharge Summary (Addendum)
Physician Discharge Summary  Jesus [name] WUJ:[id number] DOB: 29-Dec-1945 DOA: 11/24/2016  PCP: [name],[name], [name]  Admit date: 11/24/2016 Discharge date: 11/29/2016   Recommendations for Outpatient Follow-Up:   1. BMP 1 week 2. Daily weights 3. augmentin until 12/27 4.   bilat Una boots and coban and change Q Mon and Thurs    Discharge Diagnosis:   Principal Problem:   Cellulitis Active Problems:   Atrial fibrillation (HCC)   Hypertension   Acute on chronic diastolic CHF (congestive heart failure) (HCC)   Morbid obesity (HCC)   Peripheral vascular disease (HCC)   Chronic venous insufficiency   Chronic anemia   Acute on chronic renal failure (HCC)   Hyponatremia   Cellulitis of lower extremity   Discharge disposition:  SNF:  Discharge Condition: Fair   Diet recommendation: Low sodium, heart healthy.    Wound care: None.   History of Present Illness:   Jesus [name] is a 70 y.o. male with medical history significant for chronic congestive heart failure, hypertension, chronic A. fib, chronic lower extremity edema/wound, peripheral vascular disease venous stasis ulcers presents to the emergency Department chief complaint of worsening pain bilateral lower extremity particular around the right. Initial evaluation reveals persistent and worsening lower extremity edema and erythema concerning for cellulitis as well as on elevated BNP, acute on chronic kidney disease.  Information is obtained from the chart and the patient however the patient is a very historian. He was discharged on the hospital 7 days ago after a 4 day admission for the same. He was discharged on doxycycline is unsure if he completed the antibiotic. He denies fever chills worsening shortness of breath. He states he stays "in a recliner". He denies dysuria hematuria frequency or urgency. He denies abdominal pain nausea vomiting diarrhea constipation melena   Hospital Course by Problem:    Chronic bilateral lower extremity wounds related to cellulitis and chronic poorly managed venous stasis in the setting of acute on chronic diastolic heart failure Evaluated by wound team who opined both legs are worse since his discharge 6 days PTA. Discharged with doxycycline unsure if he finished or if he was taking it.  In addition, there is concern that his home situation is unsafe and not conducive to him getting any better.  Patient reports increased pain and wounds appear worse from recent discharge [facility]. Patient is afebrile nontoxic appearing lactic acid within the limits of normal. BNP 247. -blood cultures, NGTD -change IV abx to Augmentin to finish a course of 10 days  -IV Lasix 60 mg TID-- change back to home PO dose of 100 TID of torosemide -Unna boots per wound care  -Skilled nursing placement for patient to have any meaningful improvement or chance of recovery   Acute on chronic diastolic heart failure. Likely noncompliant with diet and medications. Echo 2016 with an EF of 55%. Meds include spironolactone. Recently hospitalized for the same at which time he received Lasix 80 mg IV 3 times a day. He was discharged 7 days ago and discharge summary indicates his baseline weight is 290-300 pounds. Currently his weight is 300, down from 310 pounds on admission.Of note, last hospitalization patient very uncooperative in obtaining daily weights and monitor intake and output. Would purposefully urinate all over the floor and refused to bear weight for daily weights.   All indications so far have been he intends to do the same for this admission.  Will continue to try but doubtful will get accurate I/O  numbers.  -not compliant with diet -resume home meds -Daily weights -Intake and output -Continue beta blocker  Acute on chronic renal failure stage III 2/2 to aggressive diuresis  -CR stable  -Repeat BMP in 1 week    Chronic atrial fibrillation. ChadsVasc 4. On Eliquis.  Rate controlled -Continue beta blocker   Hypertension. Controlled in the emergency department. -Continue home meds  Chronic anemia. Hemoglobin 11. This appears to be close to his baseline. Signs symptoms of active bleeding -Monitor  Hyponatremia. Chronic. Sodium 130. Chart review indicates this is close to his baseline. -See above -Monitor     Medical Consultants:    None.   Discharge Exam:   Vitals:   11/28/16 2105 11/29/16 0503  BP: 139/68 (!) 149/89  Pulse: 86 72  Resp: 19 20  Temp: 97.6 F (36.4 C) 97.4 F (36.3 C)   Vitals:   11/28/16 0556 11/28/16 1345 11/28/16 2105 11/29/16 0503  BP: 133/83 136/73 139/68 (!) 149/89  Pulse: 67 72 86 72  Resp: 18  19 20   Temp: 97.5 F (36.4 C) 97.3 F (36.3 C) 97.6 F (36.4 C) 97.4 F (36.3 C)  TempSrc: Oral Oral Oral Oral  SpO2: 94% 96% 98% 99%  Weight:      Height:        Gen:  NAD   The results of significant diagnostics from this hospitalization (including imaging, microbiology, ancillary and laboratory) are listed below for reference.     Procedures and Diagnostic Studies:   No results found.   Labs:   Basic Metabolic Panel:  Recent Labs Lab 11/25/16 0531 11/26/16 0559 11/27/16 0545 11/28/16 0627 11/29/16 0542  NA 132* 133* 133* 132* 131*  K 3.8 3.8 4.1 4.5 4.1  CL 94* 93* 93* 88* 90*  CO2 28 29 34* 33* 28  GLUCOSE 156* 153* 167* 119* 116*  BUN 34* 30* 26* 26* 35*  CREATININE 1.33* 1.46* 1.43* 1.60* 1.65*  CALCIUM 9.0 8.9 9.1 9.6 9.4   GFR Estimated Creatinine Clearance: 57.9 mL/min (by C-G formula based on SCr of 1.65 mg/dL (H)). Liver Function Tests:  Recent Labs Lab 11/24/16 1110  AST 23  ALT 19  ALKPHOS 181*  BILITOT 1.4*  PROT 7.1  ALBUMIN 3.2*   No results for input(s): LIPASE, AMYLASE in the last 168 hours. No results for input(s): AMMONIA in the last 168 hours. Coagulation profile No results for input(s): INR, PROTIME in the last 168 hours.  CBC:  Recent  Labs Lab 11/24/16 1110 11/25/16 0531 11/26/16 0559 11/27/16 0545 11/28/16 0627  WBC 9.6 9.4 8.4 8.4 9.4  NEUTROABS 7.8*  --  6.7 6.9 7.5  HGB 11.8* 11.9* 11.2* 11.7* 11.7*  HCT 36.7* 37.6* 35.5* 36.6* 37.0*  MCV 86.6 87.6 88.5 89.7 88.1  PLT 360 341 352 366 412*   Cardiac Enzymes: No results for input(s): CKTOTAL, CKMB, CKMBINDEX, TROPONINI in the last 168 hours. BNP: Invalid input(s): POCBNP CBG: No results for input(s): GLUCAP in the last 168 hours. D-Dimer No results for input(s): DDIMER in the last 72 hours. Hgb A1c No results for input(s): HGBA1C in the last 72 hours. Lipid Profile No results for input(s): CHOL, HDL, LDLCALC, TRIG, CHOLHDL, LDLDIRECT in the last 72 hours. Thyroid function studies No results for input(s): TSH, T4TOTAL, T3FREE, THYROIDAB in the last 72 hours.  Invalid input(s): FREET3 Anemia work up No results for input(s): VITAMINB12, FOLATE, FERRITIN, TIBC, IRON, RETICCTPCT in the last 72 hours. Microbiology Recent Results (from the past 240 hour(s))  Blood culture (routine x 2)     Status: None (Preliminary result)   Collection Time: 11/24/16 11:10 AM  Result Value Ref Range Status   Specimen Description BLOOD LEFT ANTECUBITAL  Final   Special Requests   Final    BOTTLES DRAWN AEROBIC AND ANAEROBIC  10CC AEB 5CC ANA   Culture NO GROWTH 4 DAYS  Final   Report Status PENDING  Incomplete  Blood culture (routine x 2)     Status: None (Preliminary result)   Collection Time: 11/24/16 11:20 AM  Result Value Ref Range Status   Specimen Description BLOOD LEFT HAND  Final   Special Requests BOTTLES DRAWN AEROBIC ONLY  10CC  Final   Culture NO GROWTH 4 DAYS  Final   Report Status PENDING  Incomplete     Discharge Instructions:   Discharge Instructions    (HEART FAILURE PATIENTS) Call MD:  Anytime you have any of the following symptoms: 1) 3 pound weight gain in 24 hours or 5 pounds in 1 week 2) shortness of breath, with or without a dry hacking cough  3) swelling in the hands, feet or stomach 4) if you have to sleep on extra pillows at night in order to breathe.    Complete by:  As directed    Call MD for:  difficulty breathing, headache or visual disturbances    Complete by:  As directed    Call MD for:  extreme fatigue    Complete by:  As directed    Call MD for:  hives    Complete by:  As directed    Call MD for:  persistant dizziness or light-headedness    Complete by:  As directed    Call MD for:  persistant nausea and vomiting    Complete by:  As directed    Call MD for:  redness, tenderness, or signs of infection (pain, swelling, redness, odor or green/yellow discharge around incision site)    Complete by:  As directed    Call MD for:  severe uncontrolled pain    Complete by:  As directed    Call MD for:  temperature >100.4    Complete by:  As directed    Diet - low sodium heart healthy    Complete by:  As directed    Diet - low sodium heart healthy    Complete by:  As directed    Discharge wound care:    Complete by:  As directed    Unna Boot change every Mo and Thursday   Increase activity slowly    Complete by:  As directed    Increase activity slowly    Complete by:  As directed      Allergies as of 11/29/2016      Reactions   Amlodipine Besylate Swelling, Other (See Comments)   "started off low and ended up severe; if, in fact, that is what's causing the swelling" (06/03/12)      Medication List    STOP taking these medications   doxycycline 100 MG tablet Commonly known as:  VIBRA-TABS   potassium chloride SA 20 MEQ tablet Commonly known as:  K-DUR,KLOR-CON     TAKE these medications   acetaminophen 325 MG tablet Commonly known as:  TYLENOL Take 2 tablets (650 mg total) by mouth every 6 (six) hours as needed for mild pain (or Fever >/= 101).   amoxicillin-clavulanate 875-125 MG tablet Commonly known as:  AUGMENTIN Take 1 tablet by mouth every 12 (twelve)  hours.   apixaban 5 MG Tabs  tablet Commonly known as:  ELIQUIS Take 1 tablet (5 mg total) by mouth 2 (two) times daily.   atorvastatin 80 MG tablet Commonly known as:  LIPITOR Take 1 tablet (80 mg total) by mouth daily.   carvedilol 6.25 MG tablet Commonly known as:  COREG Take 1 tablet (6.25 mg total) by mouth 2 (two) times daily with a meal.   feeding supplement (PRO-STAT SUGAR FREE 64) Liqd Take 30 mLs by mouth 2 (two) times daily.   hydrOXYzine 25 MG tablet Commonly known as:  ATARAX/VISTARIL Take 1 tablet (25 mg total) by mouth 3 (three) times daily as needed (for itching).   pantoprazole 40 MG tablet Commonly known as:  PROTONIX Take 1 tablet (40 mg total) by mouth 2 (two) times daily.   saccharomyces boulardii 250 MG capsule Commonly known as:  FLORASTOR Take 1 capsule (250 mg total) by mouth 2 (two) times daily.   spironolactone 25 MG tablet Commonly known as:  ALDACTONE Take 1 tablet (25 mg total) by mouth daily. Start 8/11   torsemide 100 MG tablet Commonly known as:  DEMADEX Take 1 tablet (100 mg total) by mouth 2 (two) times daily.      Contact information for after-discharge care    Destination    [facility] Follow up.   Specialty:  Skilled Nursing Facility Contact information: [address] [place] [address] [phone]              Signed:  Latrelle Dodrill, MD   [facility] 11/29/2016, 9:06 AM

## 2016-12-03 ENCOUNTER — Encounter: Payer: Self-pay | Admitting: Internal Medicine

## 2016-12-03 ENCOUNTER — Non-Acute Institutional Stay (SKILLED_NURSING_FACILITY): Payer: Commercial Managed Care - HMO | Admitting: Internal Medicine

## 2016-12-03 DIAGNOSIS — K219 Gastro-esophageal reflux disease without esophagitis: Secondary | ICD-10-CM

## 2016-12-03 DIAGNOSIS — D638 Anemia in other chronic diseases classified elsewhere: Secondary | ICD-10-CM

## 2016-12-03 DIAGNOSIS — I480 Paroxysmal atrial fibrillation: Secondary | ICD-10-CM

## 2016-12-03 DIAGNOSIS — I5022 Chronic systolic (congestive) heart failure: Secondary | ICD-10-CM

## 2016-12-03 DIAGNOSIS — R1310 Dysphagia, unspecified: Secondary | ICD-10-CM | POA: Diagnosis not present

## 2016-12-03 DIAGNOSIS — E871 Hypo-osmolality and hyponatremia: Secondary | ICD-10-CM | POA: Diagnosis not present

## 2016-12-03 DIAGNOSIS — L03119 Cellulitis of unspecified part of limb: Secondary | ICD-10-CM

## 2016-12-03 DIAGNOSIS — I739 Peripheral vascular disease, unspecified: Secondary | ICD-10-CM

## 2016-12-03 DIAGNOSIS — N183 Chronic kidney disease, stage 3 unspecified: Secondary | ICD-10-CM

## 2016-12-03 DIAGNOSIS — I10 Essential (primary) hypertension: Secondary | ICD-10-CM | POA: Diagnosis not present

## 2016-12-03 DIAGNOSIS — R531 Weakness: Secondary | ICD-10-CM | POA: Diagnosis not present

## 2016-12-03 NOTE — Progress Notes (Signed)
LOCATION: [facility]  PCP: Gerre Pebbles, [name]   Code Status: Full Code  Goals of care: Advanced Directive information Advanced Directives 11/25/2016  Does Patient Have a Medical Advance Directive? Yes  Type of Advance Directive Healthcare Power of Attorney  Does patient want to make changes to medical advance directive? No - Patient declined  Copy of Healthcare Power of Attorney in Chart? Yes  Would patient like information on creating a medical advance directive? -       Extended Emergency Contact Information Primary Emergency Contact: Deforge,[name]  [place] Mobile Phone: [phone] Relation: Daughter Secondary Emergency Contact: Bjelland,[name]  [place] Phone: [phone] Relation: Son   Allergies  Allergen Reactions  . Amlodipine Besylate Swelling and Other (See Comments)    "started off low and ended up severe; if, in fact, that is what's causing the swelling" (06/03/12)    Chief Complaint  Patient presents with  . New Admit To SNF    New Admission Visit      HPI:  Patient is a 70 y.o. male seen today for short term rehabilitation post hospital [facility] with lower extremity cellulitis and CHF exacerbation. He required IV antibiotic and IV diuretics. He has medical history of chronic kidney disease stage III, atrial fibrillation, hypertension, anemia of chronic disease, peripheral vascular disease among others. He is seen in his room today.  Review of Systems:  Constitutional: Negative for fever, chills, diaphoresis.  HENT: Negative for headache, congestion, nasal discharge. He has trouble swallowing his pills. Eyes: Negative for blurred vision, double vision and discharge.  Respiratory: Negative for cough, shortness of breath and wheezing.   Cardiovascular: Negative for chest pain, palpitations. He has chronic leg edema.  Gastrointestinal: Negative for heartburn, nausea, vomiting,  abdominal pain. He had a bowel movement yesterday. Genitourinary: Negative for dysuria.  Musculoskeletal: Negative for back pain, fall.  positive for leg pain. Skin: Negative for itching, rash.  Neurological: Negative for dizziness. Psychiatric/Behavioral: Negative for depression   Past Medical History:  Diagnosis Date  . Arthritis    "normal for my age" (11/13/2016)  . Cellulitis and abscess of leg 11/2016  . Chronic atrial fibrillation (HCC)   . Chronic diastolic CHF (congestive heart failure) (HCC) 12/2002   a. EF 50-55% by echo 04/2012  . Edema of lower extremity   . Gout   . H/O: GI bleed    "cause I took too much aspirin"  . High cholesterol   . Hyperglycemia    Noted 05/2012  . Hypertension   . Morbid obesity (HCC)   . Venous stasis ulcer (HCC)    Past Surgical History:  Procedure Laterality Date  . NO PAST SURGERIES     Social History:   reports that he quit smoking about 51 years ago. His smoking use included Cigarettes. He has never used smokeless tobacco. He reports that he drinks about 8.4 oz of alcohol per week . He reports that he does not use drugs.  Family History  Problem Relation Age of Onset  . Arrhythmia Father   . Hypertension Father   . Cancer Mother     cervical    Medications: Allergies as of 12/03/2016      Reactions   Amlodipine Besylate Swelling, Other (See Comments)   "started off low and ended up severe; if, in fact, that is what's causing the swelling" (06/03/12)      Medication List       Accurate as of  12/03/16 12:41 PM. Always use your most recent med list.          acetaminophen 325 MG tablet Commonly known as:  TYLENOL Take 2 tablets (650 mg total) by mouth every 6 (six) hours as needed for mild pain (or Fever >/= 101).   amoxicillin-clavulanate 875-125 MG tablet Commonly known as:  AUGMENTIN Take 1 tablet by mouth every 12 (twelve) hours.   apixaban 5 MG Tabs tablet Commonly known as:  ELIQUIS Take 1 tablet (5 mg total)  by mouth 2 (two) times daily.   atorvastatin 80 MG tablet Commonly known as:  LIPITOR Take 1 tablet (80 mg total) by mouth daily.   carvedilol 6.25 MG tablet Commonly known as:  COREG Take 1 tablet (6.25 mg total) by mouth 2 (two) times daily with a meal.   feeding supplement (PRO-STAT SUGAR FREE 64) Liqd Take 30 mLs by mouth 2 (two) times daily.   hydrOXYzine 25 MG tablet Commonly known as:  ATARAX/VISTARIL Take 1 tablet (25 mg total) by mouth 3 (three) times daily as needed (for itching).   pantoprazole 40 MG tablet Commonly known as:  PROTONIX Take 1 tablet (40 mg total) by mouth 2 (two) times daily.   saccharomyces boulardii 250 MG capsule Commonly known as:  FLORASTOR Take 1 capsule (250 mg total) by mouth 2 (two) times daily.   spironolactone 25 MG tablet Commonly known as:  ALDACTONE Take 1 tablet (25 mg total) by mouth daily. Start 8/11   torsemide 100 MG tablet Commonly known as:  DEMADEX Take 1 tablet (100 mg total) by mouth 2 (two) times daily.       Immunizations:  There is no immunization history on file for this patient.   Physical Exam: Vitals:   12/03/16 1238  BP: (!) 172/76  Pulse: 82  Resp: 20  Temp: 99.4 F (37.4 C)  TempSrc: Oral  SpO2: 92%  Weight: 300 lb (136.1 kg)  Height: 5\' 10"  (1.778 m)   Body mass index is 43.05 kg/m.  General- elderly Male, morbidly obese, in no acute distress Head- normocephalic, atraumatic Nose- no maxillary or frontal sinus tenderness, no nasal discharge Throat- moist mucus membrane Eyes- PERRLA, EOMI, no pallor, no icterus Neck- no cervical lymphadenopathy Cardiovascular- normal s1,s2, no murmur Respiratory- bilateral clear to auscultation, no wheeze, no rhonchi, no crackles, no use of accessory muscles Abdomen- bowel sounds present, soft, non tender Musculoskeletal- able to move his upper extremities, generalized weakness to his lower extremities, both legs were wrapped in Unna boots  Neurological-  alert and oriented to person, place and time Skin- warm and dry, Psychiatry- normal mood and affect    Labs reviewed: Basic Metabolic Panel:  Recent Labs  16/10/96 1050  11/27/16 0545 11/28/16 0627 11/29/16 0542  NA 130*  < > 133* 132* 131*  K 4.7  < > 4.1 4.5 4.1  CL 91*  < > 93* 88* 90*  CO2 29  < > 34* 33* 28  GLUCOSE 111*  < > 167* 119* 116*  BUN 44*  < > 26* 26* 35*  CREATININE 1.83*  < > 1.43* 1.60* 1.65*  CALCIUM 9.0  < > 9.1 9.6 9.4  PHOS 3.5  --   --   --   --   < > = values in this interval not displayed. Liver Function Tests:  Recent Labs  11/16/16 1050 11/24/16 1110  AST  --  23  ALT  --  19  ALKPHOS  --  181*  BILITOT  --  1.4*  PROT  --  7.1  ALBUMIN 3.1* 3.2*   No results for input(s): LIPASE, AMYLASE in the last 8760 hours. No results for input(s): AMMONIA in the last 8760 hours. CBC:  Recent Labs  11/26/16 0559 11/27/16 0545 11/28/16 0627  WBC 8.4 8.4 9.4  NEUTROABS 6.7 6.9 7.5  HGB 11.2* 11.7* 11.7*  HCT 35.5* 36.6* 37.0*  MCV 88.5 89.7 88.1  PLT 352 366 412*   Cardiac Enzymes: No results for input(s): CKTOTAL, CKMB, CKMBINDEX, TROPONINI in the last 8760 hours. BNP: Invalid input(s): POCBNP CBG: No results for input(s): GLUCAP in the last 8760 hours.  Radiological Exams: Dg Chest 2 View  Result Date: 11/13/2016 CLINICAL DATA:  Nonproductive congested cough, chronic. Fluid retention in the lower extremities. EXAM: CHEST  2 VIEW COMPARISON:  PA and lateral chest x-ray of July 08, 2015 FINDINGS: The lungs are adequately inflated. There is a small right pleural effusion. The interstitial markings of both lungs are increased. The pulmonary vascularity is engorged. The cardiac silhouette is enlarged. There is calcification in the wall of the aortic arch. The observed bony thorax exhibits no acute abnormality. IMPRESSION: CHF with mild pulmonary interstitial edema. Small right pleural effusion. Electronically Signed   By: David  [place]azil[place] M.D.    On: 11/13/2016 17:04    Assessment/Plan  Generalized weakness Mainly to his lower extremities. Will need for him to work with physical therapy and occupational therapy as tolerated to help regain his strength and balance. Fall precautions to be taken.  Lower extremity cellulitis Continue and complete his course of oral Augmentin today. Monitor for signs of infection. Continue skin care. Will have him on Norco 5-3 25 mg 1-2 tablet every 6 hours as needed for pain for now. Get PMR consult.  Peripheral vascular disease With chronic venous insufficiency and leg edema. Currently has unna boot in place. Patient has limited mobility because of which unna boot will not provide the desired compression at rest. Consulted with treatment nurse and will order Profore wrap for better compression. Advised to keep his legs elevated when possible.  Dysphagia Will have speech therapy to evaluate for swallowing dysfunction. Monitor for aspiration.   Congestive heart failure Currently on torsemide 100 mg twice a day and Aldactone 25 mg daily. Continue Coreg 6.25 mg twice a day. Monitor BMP. Monitor weights 3 days a week.  Atrial fibrillation Rate currently controlled. Continue eliquis for anticoagulation.  Gastroesophageal reflux disease Continue Protonix.  Hyponatremia Monitor BMP  Chronic kidney disease stage III Monitor blood pressure reading and BMP  Anemia of chronic disease Check CBC  Hypertension Continue torsemide, Aldactone and beta blocker. Monitor blood pressure reading.   Goals of care: short term rehabilitation   Labs/tests ordered: CBC, CMP 12/04/2016  Family/ staff Communication: reviewed care plan with patient and nursing supervisor    Oneal GroutMAHIMA Cebert Dettmann, MD Internal Medicine [facility] [address] [place], [place] [address] Cell Phone (Monday-Friday 8 am - 5 pm): [phone] On Call: [phone] and follow prompts after 5 pm and on  weekends Office Phone: [phone] Office Fax: [phone]

## 2016-12-04 LAB — HEPATIC FUNCTION PANEL
ALT: 25 U/L (ref 10–40)
AST: 26 U/L (ref 14–40)
Alkaline Phosphatase: 289 U/L — AB (ref 25–125)
BILIRUBIN, TOTAL: 1 mg/dL

## 2016-12-04 LAB — BASIC METABOLIC PANEL
BUN: 38 mg/dL — AB (ref 4–21)
Creatinine: 1.1 mg/dL (ref 0.6–1.3)
GLUCOSE: 137 mg/dL
POTASSIUM: 4.1 mmol/L (ref 3.4–5.3)
Sodium: 133 mmol/L — AB (ref 137–147)

## 2016-12-04 LAB — CBC AND DIFFERENTIAL
HEMATOCRIT: 34 % — AB (ref 41–53)
HEMOGLOBIN: 11 g/dL — AB (ref 13.5–17.5)
NEUTROS ABS: 7 /uL
Platelets: 385 10*3/uL (ref 150–399)
WBC: 9.1 10*3/mL

## 2016-12-09 DIAGNOSIS — D649 Anemia, unspecified: Secondary | ICD-10-CM | POA: Diagnosis not present

## 2016-12-10 DIAGNOSIS — F39 Unspecified mood [affective] disorder: Secondary | ICD-10-CM | POA: Diagnosis not present

## 2016-12-10 DIAGNOSIS — G3184 Mild cognitive impairment, so stated: Secondary | ICD-10-CM | POA: Diagnosis not present

## 2016-12-12 DIAGNOSIS — R2689 Other abnormalities of gait and mobility: Secondary | ICD-10-CM | POA: Diagnosis not present

## 2016-12-12 DIAGNOSIS — I872 Venous insufficiency (chronic) (peripheral): Secondary | ICD-10-CM | POA: Diagnosis not present

## 2016-12-12 DIAGNOSIS — R41841 Cognitive communication deficit: Secondary | ICD-10-CM | POA: Diagnosis not present

## 2016-12-12 DIAGNOSIS — M6281 Muscle weakness (generalized): Secondary | ICD-10-CM | POA: Diagnosis not present

## 2016-12-12 DIAGNOSIS — L8945 Pressure ulcer of contiguous site of back, buttock and hip, unstageable: Secondary | ICD-10-CM | POA: Diagnosis not present

## 2016-12-12 DIAGNOSIS — F603 Borderline personality disorder: Secondary | ICD-10-CM | POA: Diagnosis not present

## 2016-12-12 DIAGNOSIS — M79662 Pain in left lower leg: Secondary | ICD-10-CM | POA: Diagnosis not present

## 2016-12-12 DIAGNOSIS — R600 Localized edema: Secondary | ICD-10-CM | POA: Diagnosis not present

## 2016-12-12 DIAGNOSIS — M79661 Pain in right lower leg: Secondary | ICD-10-CM | POA: Diagnosis not present

## 2016-12-15 DIAGNOSIS — F603 Borderline personality disorder: Secondary | ICD-10-CM | POA: Diagnosis not present

## 2016-12-15 DIAGNOSIS — M79661 Pain in right lower leg: Secondary | ICD-10-CM | POA: Diagnosis not present

## 2016-12-15 DIAGNOSIS — R600 Localized edema: Secondary | ICD-10-CM | POA: Diagnosis not present

## 2016-12-15 DIAGNOSIS — M6281 Muscle weakness (generalized): Secondary | ICD-10-CM | POA: Diagnosis not present

## 2016-12-15 DIAGNOSIS — R2689 Other abnormalities of gait and mobility: Secondary | ICD-10-CM | POA: Diagnosis not present

## 2016-12-15 DIAGNOSIS — R41841 Cognitive communication deficit: Secondary | ICD-10-CM | POA: Diagnosis not present

## 2016-12-15 DIAGNOSIS — L8945 Pressure ulcer of contiguous site of back, buttock and hip, unstageable: Secondary | ICD-10-CM | POA: Diagnosis not present

## 2016-12-15 DIAGNOSIS — I872 Venous insufficiency (chronic) (peripheral): Secondary | ICD-10-CM | POA: Diagnosis not present

## 2016-12-15 DIAGNOSIS — M79662 Pain in left lower leg: Secondary | ICD-10-CM | POA: Diagnosis not present

## 2016-12-16 DIAGNOSIS — R600 Localized edema: Secondary | ICD-10-CM | POA: Diagnosis not present

## 2016-12-16 DIAGNOSIS — R2689 Other abnormalities of gait and mobility: Secondary | ICD-10-CM | POA: Diagnosis not present

## 2016-12-16 DIAGNOSIS — R41841 Cognitive communication deficit: Secondary | ICD-10-CM | POA: Diagnosis not present

## 2016-12-16 DIAGNOSIS — M79662 Pain in left lower leg: Secondary | ICD-10-CM | POA: Diagnosis not present

## 2016-12-16 DIAGNOSIS — M79661 Pain in right lower leg: Secondary | ICD-10-CM | POA: Diagnosis not present

## 2016-12-16 DIAGNOSIS — M6281 Muscle weakness (generalized): Secondary | ICD-10-CM | POA: Diagnosis not present

## 2016-12-16 DIAGNOSIS — I872 Venous insufficiency (chronic) (peripheral): Secondary | ICD-10-CM | POA: Diagnosis not present

## 2016-12-16 DIAGNOSIS — L8945 Pressure ulcer of contiguous site of back, buttock and hip, unstageable: Secondary | ICD-10-CM | POA: Diagnosis not present

## 2016-12-16 DIAGNOSIS — F603 Borderline personality disorder: Secondary | ICD-10-CM | POA: Diagnosis not present

## 2016-12-17 DIAGNOSIS — R41841 Cognitive communication deficit: Secondary | ICD-10-CM | POA: Diagnosis not present

## 2016-12-17 DIAGNOSIS — L8945 Pressure ulcer of contiguous site of back, buttock and hip, unstageable: Secondary | ICD-10-CM | POA: Diagnosis not present

## 2016-12-17 DIAGNOSIS — I872 Venous insufficiency (chronic) (peripheral): Secondary | ICD-10-CM | POA: Diagnosis not present

## 2016-12-17 DIAGNOSIS — R600 Localized edema: Secondary | ICD-10-CM | POA: Diagnosis not present

## 2016-12-17 DIAGNOSIS — R2689 Other abnormalities of gait and mobility: Secondary | ICD-10-CM | POA: Diagnosis not present

## 2016-12-17 DIAGNOSIS — M79662 Pain in left lower leg: Secondary | ICD-10-CM | POA: Diagnosis not present

## 2016-12-17 DIAGNOSIS — F603 Borderline personality disorder: Secondary | ICD-10-CM | POA: Diagnosis not present

## 2016-12-17 DIAGNOSIS — M6281 Muscle weakness (generalized): Secondary | ICD-10-CM | POA: Diagnosis not present

## 2016-12-17 DIAGNOSIS — M79661 Pain in right lower leg: Secondary | ICD-10-CM | POA: Diagnosis not present

## 2016-12-18 ENCOUNTER — Encounter: Payer: Self-pay | Admitting: Adult Health

## 2016-12-18 ENCOUNTER — Non-Acute Institutional Stay (SKILLED_NURSING_FACILITY): Payer: Commercial Managed Care - HMO | Admitting: Adult Health

## 2016-12-18 DIAGNOSIS — L89150 Pressure ulcer of sacral region, unstageable: Secondary | ICD-10-CM | POA: Diagnosis not present

## 2016-12-18 DIAGNOSIS — E8809 Other disorders of plasma-protein metabolism, not elsewhere classified: Secondary | ICD-10-CM | POA: Diagnosis not present

## 2016-12-18 DIAGNOSIS — N183 Chronic kidney disease, stage 3 unspecified: Secondary | ICD-10-CM

## 2016-12-18 DIAGNOSIS — F603 Borderline personality disorder: Secondary | ICD-10-CM | POA: Diagnosis not present

## 2016-12-18 DIAGNOSIS — M79662 Pain in left lower leg: Secondary | ICD-10-CM | POA: Diagnosis not present

## 2016-12-18 DIAGNOSIS — R748 Abnormal levels of other serum enzymes: Secondary | ICD-10-CM | POA: Diagnosis not present

## 2016-12-18 DIAGNOSIS — M6281 Muscle weakness (generalized): Secondary | ICD-10-CM | POA: Diagnosis not present

## 2016-12-18 DIAGNOSIS — L8945 Pressure ulcer of contiguous site of back, buttock and hip, unstageable: Secondary | ICD-10-CM | POA: Diagnosis not present

## 2016-12-18 DIAGNOSIS — I872 Venous insufficiency (chronic) (peripheral): Secondary | ICD-10-CM | POA: Diagnosis not present

## 2016-12-18 DIAGNOSIS — L03319 Cellulitis of trunk, unspecified: Secondary | ICD-10-CM | POA: Diagnosis not present

## 2016-12-18 DIAGNOSIS — R2689 Other abnormalities of gait and mobility: Secondary | ICD-10-CM | POA: Diagnosis not present

## 2016-12-18 DIAGNOSIS — R41841 Cognitive communication deficit: Secondary | ICD-10-CM | POA: Diagnosis not present

## 2016-12-18 DIAGNOSIS — R600 Localized edema: Secondary | ICD-10-CM | POA: Diagnosis not present

## 2016-12-18 DIAGNOSIS — M79661 Pain in right lower leg: Secondary | ICD-10-CM | POA: Diagnosis not present

## 2016-12-18 NOTE — Progress Notes (Signed)
DATE:  12/18/2016  MRN:  [id number]  BIRTHDAY: 1946/02/13  Facility:  [facility]ursing Home Location:  [place] Room Number: 703-P  LEVEL OF CARE:  SNF [phone])  Contact Information    Name Relation Home Work Mobile   [facility] Daughter [phone]  [phone]   Nahom, Carfagno   [phone]       Code Status History    Date Active Date Inactive Code Status Order ID Comments User Context   11/24/2016  1:24 PM 11/29/2016  3:07 PM Full Code [id number]  Radene Gunning, NP ED   11/13/2016 10:41 PM 11/17/2016  9:47 PM Full Code [id number]  Rise Patience, MD Inpatient   07/09/2015 12:40 AM 07/18/2015  5:56 PM Full Code [id number]  Rise Patience, MD Inpatient   01/04/2015 10:39 PM 01/12/2015  7:37 PM Full Code [id number]  Sid Falcon, MD Inpatient       Chief Complaint  Patient presents with  . Acute Visit    Cellulitis of sacrum    HISTORY OF PRESENT ILLNESS:  This is a 89-YO male who was referred to me by wound nurse to be assessed. He has bilateral sacral unstageable pressure ulcer, erythematous with black and brown. WBC noted to be elevated at 11.2 and creatinine at 1.30 with GFR of 57.91 and Alkaline phosphatase 293, elevated.   He has been admitted to [facility] on 11/29/16 from [facility]  admission 11/24/16 through 11/29/16 with lower extremity cellulitis and CHF exacerbation. He required IV antibiotic and IV diuretics. He has PMH of chronic kidney disease stage III, atrial fibrillation, hypertension, anemia of chronic disease and peripheral vascular disease.   PAST MEDICAL HISTORY:  Past Medical History:  Diagnosis Date  . Arthritis    "normal for my age" (11/13/2016)  . Cellulitis and abscess of leg 11/2016  . Chronic atrial fibrillation ([place])   . Chronic diastolic CHF (congestive heart failure) ([place]) 12/2002   a. EF 50-55% by echo 04/2012  . Edema of lower extremity   . Gout   . H/O: GI bleed    "cause I took too  much aspirin"  . High cholesterol   . Hyperglycemia    Noted 05/2012  . Hypertension   . Morbid obesity ([place])   . Venous stasis ulcer (HCC)      CURRENT MEDICATIONS: Reviewed  Patient's Medications  New Prescriptions   No medications on file  Previous Medications   ACETAMINOPHEN (TYLENOL) 500 MG TABLET    Take 500 mg by mouth every 8 (eight) hours as needed for moderate pain (Pain score 4-6/10).   AMINO ACIDS-PROTEIN HYDROLYS (FEEDING SUPPLEMENT, PRO-STAT SUGAR FREE 64,) LIQD    Take 30 mLs by mouth 2 (two) times daily.   APIXABAN (ELIQUIS) 5 MG TABS TABLET    Take 1 tablet (5 mg total) by mouth 2 (two) times daily.   ATORVASTATIN (LIPITOR) 80 MG TABLET    Take 1 tablet (80 mg total) by mouth daily.   CARVEDILOL (COREG) 6.25 MG TABLET    Take 1 tablet (6.25 mg total) by mouth 2 (two) times daily with a meal.   DIVALPROEX (DEPAKOTE SPRINKLE) 125 MG CAPSULE    Take 125 mg by mouth 2 (two) times daily.   DIVALPROEX (DEPAKOTE) 250 MG DR TABLET    Take 250 mg by mouth 3 (three) times daily.   HYDROCODONE-ACETAMINOPHEN (NORCO/VICODIN) 5-325 MG TABLET    Take 1 tablet by mouth. Give 1 tablet  scheduled at 9AM and 1 tablet q6h prn for severe pain scale of 7/10.   HYDROXYZINE (ATARAX/VISTARIL) 25 MG TABLET    Take 1 tablet (25 mg total) by mouth 3 (three) times daily as needed (for itching).   LEVOFLOXACIN (LEVAQUIN) 500 MG TABLET    Take 500 mg by mouth daily. x10 days   PANTOPRAZOLE (PROTONIX) 40 MG TABLET    Take 1 tablet (40 mg total) by mouth 2 (two) times daily.   QUETIAPINE (SEROQUEL) 25 MG TABLET    Take 25 mg by mouth 2 (two) times daily.   SACCHAROMYCES BOULARDII (FLORASTOR) 250 MG CAPSULE    Take 250 mg by mouth 2 (two) times daily. x13 days   SPIRONOLACTONE (ALDACTONE) 25 MG TABLET    Take 1 tablet (25 mg total) by mouth daily. Start 8/11   TORSEMIDE (DEMADEX) 100 MG TABLET    Take 1 tablet (100 mg total) by mouth 2 (two) times daily.  Modified Medications   No medications on file    Discontinued Medications   ACETAMINOPHEN (TYLENOL) 325 MG TABLET    Take 2 tablets (650 mg total) by mouth every 6 (six) hours as needed for mild pain (or Fever >/= 101).   AMOXICILLIN-CLAVULANATE (AUGMENTIN) 875-125 MG TABLET    Take 1 tablet by mouth every 12 (twelve) hours.   SACCHAROMYCES BOULARDII (FLORASTOR) 250 MG CAPSULE    Take 1 capsule (250 mg total) by mouth 2 (two) times daily.     Allergies  Allergen Reactions  . Amlodipine Besylate Swelling and Other (See Comments)    "started off low and ended up severe; if, in fact, that is what's causing the swelling" (06/03/12)     REVIEW OF SYSTEMS:  GENERAL: no change in appetite, no fatigue, no weight changes, no fever, chills or weakness EYES: Denies change in vision, dry eyes, eye pain, itching or discharge EARS: Denies change in hearing, ringing in ears, or earache NOSE: Denies nasal congestion or epistaxis MOUTH and THROAT: Denies oral discomfort, gingival pain or bleeding, pain from teeth or hoarseness   RESPIRATORY: no cough, SOB, DOE, wheezing, hemoptysis CARDIAC: no chest pain, edema or palpitations GI: no abdominal pain, diarrhea, constipation, heart burn, nausea or vomiting GU: Denies dysuria, frequency, hematuria, incontinence, or discharge PSYCHIATRIC: Denies feeling of depression or anxiety. No report of hallucinations, insomnia, paranoia, +agitation    PHYSICAL EXAMINATION  GENERAL APPEARANCE: Well nourished. In no acute distress. Morbidly obese SKIN:  Sacral region unstageable erythematous pressure ulcer with black and brown slough, bilateral lower extremity is wrapped with Profore dressing HEAD: Normal in size and contour. No evidence of trauma EYES: Lids open and close normally. No blepharitis, entropion or ectropion. PERRL. Conjunctivae are clear and sclerae are white. Lenses are without opacity EARS: Pinnae are normal. Patient hears normal voice tunes of the examiner MOUTH and THROAT: Lips are without  lesions. Oral mucosa is moist and without lesions. Tongue is normal in shape, size, and color and without lesions NECK: supple, trachea midline, no neck masses, no thyroid tenderness, no thyromegaly LYMPHATICS: no LAN in the neck, no supraclavicular LAN RESPIRATORY: breathing is even & unlabored, BS CTAB CARDIAC: RRR, no murmur,no extra heart sounds, no edema GI: abdomen soft, normal BS, no masses, no tenderness, no hepatomegaly, no splenomegaly EXTREMITIES:  Able to move 4 extremities; BLE generalized weakness PSYCHIATRIC: Alert and oriented X 3. Affect and behavior are appropriate  LABS/RADIOLOGY: Labs reviewed: 12/18/16   WBC 11.2 hemoglobin 12.3 hematocrit 37.5 MCV 85.8 platelet 383  ESR 111 sodium 143  K 3.9   glucose 119 BUN 60 creatinine 1.30 calcium 9.7 total protein 6.4 albumin 3.120 total bilirubin 1.30 alkaline phosphatase 293 SGOT 33 SGPT 17 GFR 57.91. Albumin 3.7 Basic Metabolic Panel:  Recent Labs  11/16/16 1050  11/27/16 0545 11/28/16 0627 11/29/16 0542 12/04/16  NA 130*  < > 133* 132* 131* 133*  K 4.7  < > 4.1 4.5 4.1 4.1  CL 91*  < > 93* 88* 90*  --   CO2 29  < > 34* 33* 28  --   GLUCOSE 111*  < > 167* 119* 116*  --   BUN 44*  < > 26* 26* 35* 38*  CREATININE 1.83*  < > 1.43* 1.60* 1.65* 1.1  CALCIUM 9.0  < > 9.1 9.6 9.4  --   PHOS 3.5  --   --   --   --   --   < > = values in this interval not displayed. Liver Function Tests:  Recent Labs  11/16/16 1050 11/24/16 1110 12/04/16  AST  --  23 26  ALT  --  19 25  ALKPHOS  --  181* 289*  BILITOT  --  1.4*  --   PROT  --  7.1  --   ALBUMIN 3.1* 3.2*  --     CBC:  Recent Labs  11/26/16 0559 11/27/16 0545 11/28/16 0627 12/04/16  WBC 8.4 8.4 9.4 9.1  NEUTROABS 6.7 6.9 7.5 7  HGB 11.2* 11.7* 11.7* 11.0*  HCT 35.5* 36.6* 37.0* 34*  MCV 88.5 89.7 88.1  --   PLT 352 366 412* 385    ASSESSMENT/PLAN:  Cellulitis of sacral area - start Levaquin 500 mg 1 tab by mouth daily 10 days and Florastor 250 mg 1  capsule PO BID X 13 days  Pressure ulcer sacral area, unstageable - continue wound treatment daily, keep skin clean and dry, turn to sides when in bed, start decubivite 1 tab by mouth daily  Hypoalbuminemia - albumin 3.12; RD consult  Chronic kidney disease, stage III - creatinine 1.30 and GFR 57.91; will monitor  Elevated liver enzyme - alk phos 293; repeat CMP in 1 week       Keven Soucy C. [place]  -  NP [employer] [phone]

## 2016-12-22 DIAGNOSIS — R600 Localized edema: Secondary | ICD-10-CM | POA: Diagnosis not present

## 2016-12-22 DIAGNOSIS — R41841 Cognitive communication deficit: Secondary | ICD-10-CM | POA: Diagnosis not present

## 2016-12-22 DIAGNOSIS — F603 Borderline personality disorder: Secondary | ICD-10-CM | POA: Diagnosis not present

## 2016-12-22 DIAGNOSIS — M79662 Pain in left lower leg: Secondary | ICD-10-CM | POA: Diagnosis not present

## 2016-12-22 DIAGNOSIS — M79661 Pain in right lower leg: Secondary | ICD-10-CM | POA: Diagnosis not present

## 2016-12-22 DIAGNOSIS — L8945 Pressure ulcer of contiguous site of back, buttock and hip, unstageable: Secondary | ICD-10-CM | POA: Diagnosis not present

## 2016-12-22 DIAGNOSIS — I872 Venous insufficiency (chronic) (peripheral): Secondary | ICD-10-CM | POA: Diagnosis not present

## 2016-12-22 DIAGNOSIS — R2689 Other abnormalities of gait and mobility: Secondary | ICD-10-CM | POA: Diagnosis not present

## 2016-12-22 DIAGNOSIS — M6281 Muscle weakness (generalized): Secondary | ICD-10-CM | POA: Diagnosis not present

## 2016-12-23 DIAGNOSIS — R600 Localized edema: Secondary | ICD-10-CM | POA: Diagnosis not present

## 2016-12-23 DIAGNOSIS — F603 Borderline personality disorder: Secondary | ICD-10-CM | POA: Diagnosis not present

## 2016-12-23 DIAGNOSIS — M6281 Muscle weakness (generalized): Secondary | ICD-10-CM | POA: Diagnosis not present

## 2016-12-23 DIAGNOSIS — M79661 Pain in right lower leg: Secondary | ICD-10-CM | POA: Diagnosis not present

## 2016-12-23 DIAGNOSIS — I872 Venous insufficiency (chronic) (peripheral): Secondary | ICD-10-CM | POA: Diagnosis not present

## 2016-12-23 DIAGNOSIS — M79662 Pain in left lower leg: Secondary | ICD-10-CM | POA: Diagnosis not present

## 2016-12-23 DIAGNOSIS — R41841 Cognitive communication deficit: Secondary | ICD-10-CM | POA: Diagnosis not present

## 2016-12-23 DIAGNOSIS — L8945 Pressure ulcer of contiguous site of back, buttock and hip, unstageable: Secondary | ICD-10-CM | POA: Diagnosis not present

## 2016-12-23 DIAGNOSIS — R2689 Other abnormalities of gait and mobility: Secondary | ICD-10-CM | POA: Diagnosis not present

## 2016-12-24 DIAGNOSIS — F39 Unspecified mood [affective] disorder: Secondary | ICD-10-CM | POA: Diagnosis not present

## 2016-12-24 DIAGNOSIS — G3184 Mild cognitive impairment, so stated: Secondary | ICD-10-CM | POA: Diagnosis not present

## 2016-12-26 DIAGNOSIS — M6281 Muscle weakness (generalized): Secondary | ICD-10-CM | POA: Diagnosis not present

## 2016-12-26 DIAGNOSIS — D649 Anemia, unspecified: Secondary | ICD-10-CM | POA: Diagnosis not present

## 2016-12-26 DIAGNOSIS — Z79899 Other long term (current) drug therapy: Secondary | ICD-10-CM | POA: Diagnosis not present

## 2016-12-26 DIAGNOSIS — D509 Iron deficiency anemia, unspecified: Secondary | ICD-10-CM | POA: Diagnosis not present

## 2016-12-28 DIAGNOSIS — D509 Iron deficiency anemia, unspecified: Secondary | ICD-10-CM | POA: Diagnosis not present

## 2016-12-28 DIAGNOSIS — N390 Urinary tract infection, site not specified: Secondary | ICD-10-CM | POA: Diagnosis not present

## 2016-12-28 DIAGNOSIS — D649 Anemia, unspecified: Secondary | ICD-10-CM | POA: Diagnosis not present

## 2016-12-28 DIAGNOSIS — Z79899 Other long term (current) drug therapy: Secondary | ICD-10-CM | POA: Diagnosis not present

## 2016-12-28 DIAGNOSIS — M6281 Muscle weakness (generalized): Secondary | ICD-10-CM | POA: Diagnosis not present

## 2016-12-29 ENCOUNTER — Encounter (HOSPITAL_COMMUNITY): Payer: Self-pay

## 2016-12-29 ENCOUNTER — Inpatient Hospital Stay (HOSPITAL_COMMUNITY)
Admission: EM | Admit: 2016-12-29 | Discharge: 2017-01-23 | DRG: 981 | Payer: Medicare HMO | Attending: Internal Medicine | Admitting: Internal Medicine

## 2016-12-29 ENCOUNTER — Emergency Department (HOSPITAL_COMMUNITY): Payer: Medicare HMO

## 2016-12-29 DIAGNOSIS — R600 Localized edema: Secondary | ICD-10-CM | POA: Diagnosis not present

## 2016-12-29 DIAGNOSIS — F329 Major depressive disorder, single episode, unspecified: Secondary | ICD-10-CM | POA: Diagnosis present

## 2016-12-29 DIAGNOSIS — K7689 Other specified diseases of liver: Secondary | ICD-10-CM

## 2016-12-29 DIAGNOSIS — I110 Hypertensive heart disease with heart failure: Secondary | ICD-10-CM | POA: Diagnosis not present

## 2016-12-29 DIAGNOSIS — N179 Acute kidney failure, unspecified: Secondary | ICD-10-CM | POA: Diagnosis present

## 2016-12-29 DIAGNOSIS — I5032 Chronic diastolic (congestive) heart failure: Secondary | ICD-10-CM | POA: Diagnosis not present

## 2016-12-29 DIAGNOSIS — K5900 Constipation, unspecified: Secondary | ICD-10-CM | POA: Diagnosis not present

## 2016-12-29 DIAGNOSIS — L89320 Pressure ulcer of left buttock, unstageable: Secondary | ICD-10-CM | POA: Diagnosis not present

## 2016-12-29 DIAGNOSIS — I482 Chronic atrial fibrillation: Secondary | ICD-10-CM | POA: Diagnosis present

## 2016-12-29 DIAGNOSIS — E870 Hyperosmolality and hypernatremia: Secondary | ICD-10-CM | POA: Diagnosis not present

## 2016-12-29 DIAGNOSIS — I83009 Varicose veins of unspecified lower extremity with ulcer of unspecified site: Secondary | ICD-10-CM | POA: Diagnosis present

## 2016-12-29 DIAGNOSIS — L7622 Postprocedural hemorrhage and hematoma of skin and subcutaneous tissue following other procedure: Secondary | ICD-10-CM | POA: Diagnosis not present

## 2016-12-29 DIAGNOSIS — R4182 Altered mental status, unspecified: Secondary | ICD-10-CM | POA: Diagnosis present

## 2016-12-29 DIAGNOSIS — E785 Hyperlipidemia, unspecified: Secondary | ICD-10-CM | POA: Diagnosis present

## 2016-12-29 DIAGNOSIS — I5033 Acute on chronic diastolic (congestive) heart failure: Secondary | ICD-10-CM | POA: Diagnosis not present

## 2016-12-29 DIAGNOSIS — R509 Fever, unspecified: Secondary | ICD-10-CM | POA: Diagnosis not present

## 2016-12-29 DIAGNOSIS — Z79899 Other long term (current) drug therapy: Secondary | ICD-10-CM | POA: Diagnosis not present

## 2016-12-29 DIAGNOSIS — Z6841 Body Mass Index (BMI) 40.0 and over, adult: Secondary | ICD-10-CM

## 2016-12-29 DIAGNOSIS — Y92239 Unspecified place in hospital as the place of occurrence of the external cause: Secondary | ICD-10-CM | POA: Diagnosis not present

## 2016-12-29 DIAGNOSIS — Z515 Encounter for palliative care: Secondary | ICD-10-CM | POA: Diagnosis not present

## 2016-12-29 DIAGNOSIS — D509 Iron deficiency anemia, unspecified: Secondary | ICD-10-CM | POA: Diagnosis not present

## 2016-12-29 DIAGNOSIS — G9340 Encephalopathy, unspecified: Secondary | ICD-10-CM | POA: Diagnosis not present

## 2016-12-29 DIAGNOSIS — K862 Cyst of pancreas: Secondary | ICD-10-CM | POA: Diagnosis not present

## 2016-12-29 DIAGNOSIS — L89150 Pressure ulcer of sacral region, unstageable: Secondary | ICD-10-CM | POA: Diagnosis not present

## 2016-12-29 DIAGNOSIS — N183 Chronic kidney disease, stage 3 (moderate): Secondary | ICD-10-CM | POA: Diagnosis present

## 2016-12-29 DIAGNOSIS — D62 Acute posthemorrhagic anemia: Secondary | ICD-10-CM | POA: Diagnosis not present

## 2016-12-29 DIAGNOSIS — Z7901 Long term (current) use of anticoagulants: Secondary | ICD-10-CM | POA: Diagnosis not present

## 2016-12-29 DIAGNOSIS — I872 Venous insufficiency (chronic) (peripheral): Secondary | ICD-10-CM | POA: Diagnosis present

## 2016-12-29 DIAGNOSIS — K7460 Unspecified cirrhosis of liver: Secondary | ICD-10-CM | POA: Diagnosis present

## 2016-12-29 DIAGNOSIS — E876 Hypokalemia: Secondary | ICD-10-CM | POA: Diagnosis present

## 2016-12-29 DIAGNOSIS — E46 Unspecified protein-calorie malnutrition: Secondary | ICD-10-CM | POA: Diagnosis present

## 2016-12-29 DIAGNOSIS — D649 Anemia, unspecified: Secondary | ICD-10-CM | POA: Diagnosis not present

## 2016-12-29 DIAGNOSIS — R2689 Other abnormalities of gait and mobility: Secondary | ICD-10-CM | POA: Diagnosis not present

## 2016-12-29 DIAGNOSIS — Y838 Other surgical procedures as the cause of abnormal reaction of the patient, or of later complication, without mention of misadventure at the time of the procedure: Secondary | ICD-10-CM | POA: Diagnosis not present

## 2016-12-29 DIAGNOSIS — F0390 Unspecified dementia without behavioral disturbance: Secondary | ICD-10-CM | POA: Diagnosis present

## 2016-12-29 DIAGNOSIS — L89159 Pressure ulcer of sacral region, unspecified stage: Secondary | ICD-10-CM | POA: Diagnosis not present

## 2016-12-29 DIAGNOSIS — L97909 Non-pressure chronic ulcer of unspecified part of unspecified lower leg with unspecified severity: Secondary | ICD-10-CM | POA: Diagnosis present

## 2016-12-29 DIAGNOSIS — R1310 Dysphagia, unspecified: Secondary | ICD-10-CM | POA: Diagnosis present

## 2016-12-29 DIAGNOSIS — Z66 Do not resuscitate: Secondary | ICD-10-CM | POA: Diagnosis not present

## 2016-12-29 DIAGNOSIS — Z888 Allergy status to other drugs, medicaments and biological substances status: Secondary | ICD-10-CM | POA: Diagnosis not present

## 2016-12-29 DIAGNOSIS — R410 Disorientation, unspecified: Secondary | ICD-10-CM

## 2016-12-29 DIAGNOSIS — F23 Brief psychotic disorder: Secondary | ICD-10-CM | POA: Diagnosis not present

## 2016-12-29 DIAGNOSIS — R938 Abnormal findings on diagnostic imaging of other specified body structures: Secondary | ICD-10-CM | POA: Diagnosis not present

## 2016-12-29 DIAGNOSIS — N390 Urinary tract infection, site not specified: Secondary | ICD-10-CM | POA: Diagnosis not present

## 2016-12-29 DIAGNOSIS — I130 Hypertensive heart and chronic kidney disease with heart failure and stage 1 through stage 4 chronic kidney disease, or unspecified chronic kidney disease: Secondary | ICD-10-CM | POA: Diagnosis present

## 2016-12-29 DIAGNOSIS — R41841 Cognitive communication deficit: Secondary | ICD-10-CM | POA: Diagnosis not present

## 2016-12-29 DIAGNOSIS — I480 Paroxysmal atrial fibrillation: Secondary | ICD-10-CM | POA: Diagnosis not present

## 2016-12-29 DIAGNOSIS — Z87891 Personal history of nicotine dependence: Secondary | ICD-10-CM | POA: Diagnosis not present

## 2016-12-29 DIAGNOSIS — R9389 Abnormal findings on diagnostic imaging of other specified body structures: Secondary | ICD-10-CM

## 2016-12-29 DIAGNOSIS — I4891 Unspecified atrial fibrillation: Secondary | ICD-10-CM | POA: Diagnosis present

## 2016-12-29 DIAGNOSIS — L89310 Pressure ulcer of right buttock, unstageable: Secondary | ICD-10-CM | POA: Diagnosis not present

## 2016-12-29 DIAGNOSIS — K8689 Other specified diseases of pancreas: Secondary | ICD-10-CM | POA: Diagnosis not present

## 2016-12-29 DIAGNOSIS — Z8249 Family history of ischemic heart disease and other diseases of the circulatory system: Secondary | ICD-10-CM | POA: Diagnosis not present

## 2016-12-29 DIAGNOSIS — R7889 Finding of other specified substances, not normally found in blood: Secondary | ICD-10-CM | POA: Diagnosis not present

## 2016-12-29 DIAGNOSIS — M6281 Muscle weakness (generalized): Secondary | ICD-10-CM | POA: Diagnosis not present

## 2016-12-29 DIAGNOSIS — L8945 Pressure ulcer of contiguous site of back, buttock and hip, unstageable: Secondary | ICD-10-CM | POA: Diagnosis not present

## 2016-12-29 DIAGNOSIS — L089 Local infection of the skin and subcutaneous tissue, unspecified: Secondary | ICD-10-CM | POA: Diagnosis present

## 2016-12-29 DIAGNOSIS — M79661 Pain in right lower leg: Secondary | ICD-10-CM | POA: Diagnosis not present

## 2016-12-29 DIAGNOSIS — K769 Liver disease, unspecified: Secondary | ICD-10-CM | POA: Diagnosis present

## 2016-12-29 DIAGNOSIS — R0602 Shortness of breath: Secondary | ICD-10-CM | POA: Diagnosis not present

## 2016-12-29 DIAGNOSIS — F603 Borderline personality disorder: Secondary | ICD-10-CM | POA: Diagnosis not present

## 2016-12-29 DIAGNOSIS — I739 Peripheral vascular disease, unspecified: Secondary | ICD-10-CM | POA: Diagnosis present

## 2016-12-29 DIAGNOSIS — L98423 Non-pressure chronic ulcer of back with necrosis of muscle: Secondary | ICD-10-CM

## 2016-12-29 DIAGNOSIS — F4489 Other dissociative and conversion disorders: Secondary | ICD-10-CM | POA: Diagnosis not present

## 2016-12-29 DIAGNOSIS — E669 Obesity, unspecified: Secondary | ICD-10-CM | POA: Diagnosis present

## 2016-12-29 DIAGNOSIS — Z800 Family history of malignant neoplasm of digestive organs: Secondary | ICD-10-CM | POA: Diagnosis not present

## 2016-12-29 DIAGNOSIS — M79662 Pain in left lower leg: Secondary | ICD-10-CM | POA: Diagnosis not present

## 2016-12-29 DIAGNOSIS — Z881 Allergy status to other antibiotic agents status: Secondary | ICD-10-CM

## 2016-12-29 HISTORY — DX: Peripheral vascular disease, unspecified: I73.9

## 2016-12-29 HISTORY — DX: Chronic kidney disease, unspecified: N17.9

## 2016-12-29 HISTORY — DX: Pressure ulcer of unspecified buttock, unstageable: L89.300

## 2016-12-29 HISTORY — DX: Chronic kidney disease, unspecified: N18.9

## 2016-12-29 HISTORY — DX: Anemia, unspecified: D64.9

## 2016-12-29 LAB — COMPREHENSIVE METABOLIC PANEL
ALT: 23 U/L (ref 17–63)
AST: 31 U/L (ref 15–41)
Albumin: 2.3 g/dL — ABNORMAL LOW (ref 3.5–5.0)
Alkaline Phosphatase: 200 U/L — ABNORMAL HIGH (ref 38–126)
Anion gap: 15 (ref 5–15)
BILIRUBIN TOTAL: 1.5 mg/dL — AB (ref 0.3–1.2)
BUN: 86 mg/dL — AB (ref 6–20)
CALCIUM: 9.7 mg/dL (ref 8.9–10.3)
CHLORIDE: 95 mmol/L — AB (ref 101–111)
CO2: 29 mmol/L (ref 22–32)
CREATININE: 1.63 mg/dL — AB (ref 0.61–1.24)
GFR calc non Af Amer: 41 mL/min — ABNORMAL LOW (ref >60)
GFR, EST AFRICAN AMERICAN: 48 mL/min — AB (ref >60)
Glucose, Bld: 141 mg/dL — ABNORMAL HIGH (ref 65–99)
Potassium: 3.8 mmol/L (ref 3.5–5.1)
Sodium: 139 mmol/L (ref 135–145)
TOTAL PROTEIN: 6.4 g/dL — AB (ref 6.5–8.1)

## 2016-12-29 LAB — URINALYSIS, ROUTINE W REFLEX MICROSCOPIC
BILIRUBIN URINE: NEGATIVE
Glucose, UA: NEGATIVE mg/dL
Hgb urine dipstick: NEGATIVE
KETONES UR: NEGATIVE mg/dL
NITRITE: NEGATIVE
PH: 6 (ref 5.0–8.0)
Protein, ur: NEGATIVE mg/dL
Specific Gravity, Urine: 1.009 (ref 1.005–1.030)
Squamous Epithelial / LPF: NONE SEEN

## 2016-12-29 LAB — I-STAT CG4 LACTIC ACID, ED: Lactic Acid, Venous: 1.65 mmol/L (ref 0.5–1.9)

## 2016-12-29 LAB — CBC WITH DIFFERENTIAL/PLATELET
Basophils Absolute: 0 10*3/uL (ref 0.0–0.1)
Basophils Relative: 0 %
Eosinophils Absolute: 0.7 10*3/uL (ref 0.0–0.7)
Eosinophils Relative: 5 %
HEMATOCRIT: 35.8 % — AB (ref 39.0–52.0)
HEMOGLOBIN: 11.7 g/dL — AB (ref 13.0–17.0)
LYMPHS ABS: 1.3 10*3/uL (ref 0.7–4.0)
Lymphocytes Relative: 9 %
MCH: 28 pg (ref 26.0–34.0)
MCHC: 32.7 g/dL (ref 30.0–36.0)
MCV: 85.6 fL (ref 78.0–100.0)
Monocytes Absolute: 0.2 10*3/uL (ref 0.1–1.0)
Monocytes Relative: 2 %
NEUTROS ABS: 11.6 10*3/uL — AB (ref 1.7–7.7)
NEUTROS PCT: 84 %
Platelets: 332 10*3/uL (ref 150–400)
RBC: 4.18 MIL/uL — ABNORMAL LOW (ref 4.22–5.81)
RDW: 16 % — ABNORMAL HIGH (ref 11.5–15.5)
WBC: 13.8 10*3/uL — ABNORMAL HIGH (ref 4.0–10.5)

## 2016-12-29 LAB — LIPASE, BLOOD: LIPASE: 27 U/L (ref 11–51)

## 2016-12-29 LAB — I-STAT TROPONIN, ED: Troponin i, poc: 0.03 ng/mL (ref 0.00–0.08)

## 2016-12-29 MED ORDER — SODIUM CHLORIDE 0.9 % IV BOLUS (SEPSIS)
500.0000 mL | Freq: Once | INTRAVENOUS | Status: AC
  Administered 2016-12-29: 500 mL via INTRAVENOUS

## 2016-12-29 MED ORDER — VANCOMYCIN HCL 10 G IV SOLR
2000.0000 mg | Freq: Once | INTRAVENOUS | Status: AC
  Administered 2016-12-29: 2000 mg via INTRAVENOUS
  Filled 2016-12-29: qty 2000

## 2016-12-29 MED ORDER — VANCOMYCIN HCL 10 G IV SOLR
1500.0000 mg | INTRAVENOUS | Status: DC
  Administered 2016-12-30 – 2016-12-31 (×2): 1500 mg via INTRAVENOUS
  Filled 2016-12-29 (×2): qty 1500

## 2016-12-29 NOTE — ED Provider Notes (Signed)
Emergency Department Provider Note   I have reviewed the triage vital signs and the nursing notes.  Level 5 caveat: Altered mental status 2/2 infection    HISTORY  Chief Complaint Altered Mental Status and Abnormal Lab   HPI Jesus [name] is a 71 y.o. male with PMH of chronic a-fib, CHF, HLD, HTN, obesity, and sacral decubitus ulcer presents to the emergency department by EMS from a [facility] with confusion and elevated white blood cell count on routine labs. Unclear how Tekela Garguilo the patient has been confused per EMS. The patient is able to produce pain in my conversation but has difficulty recalling specific details regarding along his had these symptoms. He does describe severe pain in his buttock area.    Past Medical History:  Diagnosis Date  . Arthritis    "normal for my age" (11/13/2016)  . Cellulitis and abscess of leg 11/2016  . Chronic atrial fibrillation (HCC)   . Chronic diastolic CHF (congestive heart failure) (HCC) 12/2002   a. EF 50-55% by echo 04/2012  . Edema of lower extremity   . Gout   . H/O: GI bleed    "cause I took too much aspirin"  . High cholesterol   . Hyperglycemia    Noted 05/2012  . Hypertension   . Morbid obesity (HCC)   . Venous stasis ulcer (HCC)     Patient Active Problem List   Diagnosis Date Noted  . Decubitus ulcer of sacral region, unstageable (HCC) 12/30/2016  . Delirium 12/30/2016  . Cellulitis of lower extremity   . Hyponatremia 07/15/2015  . Cellulitis of right lower extremity 07/15/2015  . Acute on chronic renal failure (HCC)   . Hypokalemia   . Chronic atrial fibrillation (HCC) 07/09/2015  . Chronic anemia 07/09/2015  . CHF exacerbation (HCC) 07/08/2015  . Chronic diastolic CHF (congestive heart failure) (HCC) 04/02/2015  . Chronic diastolic heart failure (HCC) 01/25/2015  . Cellulitis 01/17/2015  . Pulmonary vascular congestion   . Congestive heart disease (HCC)   . Peripheral edema   . Acute  exacerbation of CHF (congestive heart failure) (HCC) 01/04/2015  . 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 Renal artery stenosis (HCC) 02/17/2014  . Peripheral vascular disease (HCC) 04/15/2013  . Chronic venous insufficiency 04/15/2013  . Morbid obesity (HCC) 06/11/2012  . Acute on chronic diastolic CHF (congestive heart failure) (HCC) 06/02/2012  . Unspecified essential hypertension 05/18/2012  . Atrial fibrillation (HCC) 04/27/2012  . CHF (congestive heart failure) (HCC) 04/27/2012  . Hypertension 04/27/2012    Past Surgical History:  Procedure Laterality Date  . NO PAST SURGERIES      Current Outpatient Rx  . Order #: [id number] Class: Historical Med  . Order #: [id number] Class: No Print  . Order #: [id number] Class: Normal  . Order #: [id number] Class: Normal  . Order #: [id number] Class: Normal  . Order #: [id number] Class: Historical Med  . Order #: [id number] Class: Historical Med  . Order #: [id number] Class: No Print  . Order #: [id number] Class: Historical Med  . Order #: [id number] Class: No Print  . Order #: [id number] Class: Historical Med  . Order #: [id number] Class: Historical Med  . Order #: [id number] Class: Normal  . Order #: [id number] Class: No Print  . Order #: [id number] Class: Historical Med    Allergies Amlodipine besylate and Levaquin [levofloxacin in d5w]  Family History  Problem Relation Age of Onset  . Arrhythmia Father   .  Hypertension Father   . Cancer Mother     cervical    Social History Social History  Substance Use Topics  . Smoking status: Former Smoker    Types: Cigarettes    Quit date: 12/08/1965  . Smokeless tobacco: Never Used     Comment: "social cigarette smoker in my late teens"  . Alcohol use 8.4 oz/week    14 Cans of beer per week    Review of Systems  Level 5 caveat: Patient with AMS 2/2 infection.    ____________________________________________   PHYSICAL EXAM:  VITAL SIGNS: ED Triage Vitals [12/29/16 1827]  Enc Vitals Group     BP 127/73     Pulse Rate 77     Resp 20     Temp 98.3 F (36.8 C)     Temp Source Oral     SpO2 96 %     Weight 300 lb (136.1 kg)     Height 5\' 10"  (1.778 m)    Constitutional: Alert with some notable confusion. No acute distress.  Eyes: Conjunctivae are normal.  Head: Atraumatic. Nose: No congestion/rhinnorhea. Mouth/Throat: Mucous membranes are dry. Neck: No stridor.  Cardiovascular: Normal rate, regular rhythm. Good peripheral circulation. Grossly normal heart sounds.   Respiratory: Normal respiratory effort.  No retractions. Lungs CTAB. Gastrointestinal: Soft and nontender. No distention. Foley catheter in place.  Musculoskeletal: No lower extremity tenderness nor edema. No gross deformities of extremities. Neurologic:  Normal speech and language. No gross focal neurologic deficits are appreciated.  Skin:  Skin is warm and dry. Severe, necrotic sacral wound.       ____________________________________________   LABS (all labs ordered are listed, but only abnormal results are displayed)  Labs Reviewed  COMPREHENSIVE METABOLIC PANEL - Abnormal; Notable for the following:       Result Value   Chloride 95 (*)    Glucose, Bld 141 (*)    BUN 86 (*)    Creatinine, Ser 1.63 (*)    Total Protein 6.4 (*)    Albumin 2.3 (*)    Alkaline Phosphatase 200 (*)    Total Bilirubin 1.5 (*)    GFR calc non Af Amer 41 (*)    GFR calc Af Amer 48 (*)    All other components within normal limits  CBC WITH DIFFERENTIAL/PLATELET - Abnormal; Notable for the following:    WBC 13.8 (*)    RBC 4.18 (*)    Hemoglobin 11.7 (*)    HCT 35.8 (*)    RDW 16.0 (*)    Neutro Abs 11.6 (*)    All other components within normal limits  URINALYSIS, ROUTINE W REFLEX MICROSCOPIC - Abnormal; Notable for the following:    Leukocytes, UA MODERATE (*)    Bacteria,  UA RARE (*)    All other components within normal limits  CK - Abnormal; Notable for the following:    Total CK 29 (*)    All other components within normal limits  CULTURE, BLOOD (ROUTINE X 2)  CULTURE, BLOOD (ROUTINE X 2)  LIPASE, BLOOD  LACTIC ACID, PLASMA  LACTIC ACID, PLASMA  I-STAT CG4 LACTIC ACID, ED  I-STAT TROPOININ, ED   ____________________________________________  EKG   EKG Interpretation  Date/Time:  Monday December 29 2016 18:30:05 EST Ventricular Rate:  76 PR Interval:    QRS Duration: 116 QT Interval:  399 QTC Calculation: 449 R Axis:   -6 Text Interpretation:  Atrial fibrillation Nonspecific intraventricular conduction delay Inferolateral infarct, old No STEMI.  Confirmed  by Lauraine Crespo MD, Saliou Barnier [phone]([id number]) on 12/29/2016 6:37:31 PM       ____________________________________________  RADIOLOGY  Dg Chest 2 View  Result Date: 12/29/2016 CLINICAL DATA:  Confusion and difficulty breathing EXAM: CHEST  2 VIEW COMPARISON:  11/13/2016 FINDINGS: Cardiac shadow is enlarged. The lungs are well aerated bilaterally. No focal infiltrate or sizable effusion is seen. No bony abnormality is noted. Old rib fractures are noted on the right. IMPRESSION: No active cardiopulmonary disease. Electronically Signed   By: Alcide CleverMark  Lukens M.D.   On: 12/29/2016 20:54   Ct Abdomen Pelvis W Contrast  Result Date: 12/30/2016 CLINICAL DATA:  71 year old male with sacral ulcer and pain. EXAM: CT ABDOMEN AND PELVIS WITH CONTRAST TECHNIQUE: Multidetector CT imaging of the abdomen and pelvis was performed using the standard protocol following bolus administration of intravenous contrast. CONTRAST:  75 cc Omnipaque 300 COMPARISON:  None. FINDINGS: Lower chest: The visualized lung bases are clear. There is moderate cardiomegaly with biatrial dilatation and enlargement of the left ventricle. Multi vessel coronary vascular calcification. No intra-abdominal free air or free fluid. Hepatobiliary: Multiple hepatic  hypodense lesions measuring up to 3.9 x 4.3 cm in the left lobe of the liver. The larger lesions demonstrate fluid attenuation and likely represents cyst or hemangioma. Smaller lesions are not well characterized. There is slight irregular appearance of the hepatic contour suggestive of underlying cirrhosis. Correlation with clinical exam recommended. Nonemergent MRI without and with contrast is recommended for further evaluation of the hepatic lesions. There is no intrahepatic biliary ductal dilatation. Layering sludge or hyperconcentrated bile noted within the gallbladder. No pericholecystic fluid. Pancreas: There is a 3.8 x 4.5 cm low attenuating/cystic lesion arising from the posterior uncinate process of the pancreas. This lesion may represent a pseudocyst or a cystic pancreatic neoplasm. MRI without and with contrast is recommended for further characterization. There is no associated gland atrophy or ductal dilatation. Spleen: Normal in size without focal abnormality. Adrenals/Urinary Tract: The adrenal glands appear unremarkable. There is a 2 cm left renal inferior pole hypodense lesion which is incompletely characterized but demonstrates fluid attenuation and likely represents a cyst. There is no hydronephrosis or nephrolithiasis on either side. The visualized ureters appear unremarkable. The urinary bladder is decompressed around a Foley catheter. Stomach/Bowel: Large amount of stool noted within the colon. There small scattered colonic diverticula without active inflammatory changes. There is no evidence of bowel obstruction or active inflammation. Normal appendix. Vascular/Lymphatic: Advanced aortoiliac atherosclerotic disease. There is a 3 cm infrarenal abdominal aortic aneurysm. No portal venous gas identified. There is no adenopathy. Reproductive: The prostate and seminal vesicles are grossly unremarkable. Other: There is ulceration of the skin over the distal sacrum with induration of the subcutaneous  fat as well as pockets of air. No drainable fluid collection or abscess identified. The soft tissue air extends into the tissues abutting the sacral bone. There is no erosive changes or evidence of osteomyelitis by CT. Musculoskeletal: There is degenerative changes of the spine. Old right rib fractures. No acute fracture. IMPRESSION: Sacral ulcer with small pockets of air in the soft tissues superficial to the distal sacral bone. No drainable fluid collection/ abscess identified. No evidence of osteomyelitis by CT. Irregular hepatic contour most compatible with cirrhosis. Multiple hepatic hypodense lesions are not characterized on the CT. Cystic lesion arising from the uncinate process of the pancreas. MRI without and with contrast is recommended for further evaluation of the hepatic and pancreatic lesions. Constipation. No bowel obstruction or active inflammation. Normal appendix. A  3 cm infrarenal abdominal aortic aneurysm. Recommend followup by ultrasound in 3 years. This recommendation follows ACR consensus guidelines: White Paper of the ACR Incidental Findings Committee II on Vascular Findings. J Am Coll Radiol 2013; 16:109-604 Electronically Signed   By: Elgie Collard M.D.   On: 12/30/2016 03:35    ____________________________________________   PROCEDURES  Procedure(s) performed:   Procedures  None ____________________________________________   INITIAL IMPRESSION / ASSESSMENT AND PLAN / ED COURSE  Pertinent labs & imaging results that were available during my care of the patient were reviewed by me and considered in my medical decision making (see chart for details).  Patient presents to emergency department for evaluation of confusion for unknown period of time. He is from [facility]. He has normal vital signs and is not meeting sepsis criteria on arrival. The patient has multiple areas of potential infection including indwelling Foley catheter with surrounding fecal  material and large, necrotic-appearing sacral decubitus wound pictured above. Plan to start vancomycin in the setting of this decubitus ulcer and AMS. Patient's CKD appears near baseline. Will CT the abdomen/pelvis to evaluate for deep space infection and possible pelvic osteomyelitis.    ____________________________________________  FINAL CLINICAL IMPRESSION(S) / ED DIAGNOSES  Final diagnoses:  Disorientation  Skin ulcer of sacrum with necrosis of muscle (HCC)     MEDICATIONS GIVEN DURING THIS VISIT:  Medications  vancomycin (VANCOCIN) 1,500 mg in sodium chloride 0.9 % 500 mL IVPB (not administered)  iopamidol (ISOVUE-300) 61 % injection (100 mLs  Incomplete 12/30/16 0207)  piperacillin-tazobactam (ZOSYN) IVPB 3.375 g (3.375 g Intravenous New Bag/Given 12/30/16 0620)  0.9 %  sodium chloride infusion ( Intravenous New Bag/Given 12/30/16 0545)  divalproex (DEPAKOTE ER) 24 hr tablet 500 mg (not administered)  acetaminophen (TYLENOL) tablet 650 mg (not administered)  HYDROcodone-acetaminophen (NORCO/VICODIN) 5-325 MG per tablet 1 tablet (not administered)  QUEtiapine (SEROQUEL) tablet 25 mg (not administered)  hydrOXYzine (ATARAX/VISTARIL) tablet 25 mg (not administered)  apixaban (ELIQUIS) tablet 5 mg (not administered)  atorvastatin (LIPITOR) tablet 80 mg (not administered)  carvedilol (COREG) tablet 6.25 mg (6.25 mg Oral Given 12/30/16 1024)  pantoprazole (PROTONIX) EC tablet 40 mg (40 mg Oral Given 12/30/16 1027)  collagenase (SANTYL) ointment (not administered)  sodium chloride 0.9 % bolus 500 mL (0 mLs Intravenous Stopped 12/29/16 2020)  vancomycin (VANCOCIN) 2,000 mg in sodium chloride 0.9 % 500 mL IVPB (0 mg Intravenous Stopped 12/29/16 2306)  fentaNYL (SUBLIMAZE) injection 50 mcg (50 mcg Intravenous Given 12/30/16 0012)  morphine 4 MG/ML injection 4 mg (4 mg Intravenous Given 12/30/16 0121)  sodium chloride 0.9 % bolus 500 mL (0 mLs Intravenous Stopped 12/30/16 0709)     NEW  OUTPATIENT MEDICATIONS STARTED DURING THIS VISIT:  None   Note:  This document was prepared using Dragon voice recognition software and may include unintentional dictation errors.  Alona Bene, MD Emergency Medicine   Maia Plan, MD 12/30/16 1051

## 2016-12-29 NOTE — Progress Notes (Signed)
Pharmacy Antibiotic Note  Jesus [name] is a 71 y.o. male admitted on 12/29/2016 with wound infection.  Pharmacy has been consulted for vancomycin dosing. Pt is afebrile and WBC is elevated at 13.8. Scr is elevated at 1.63 and lactic acid is <2.   Plan: Vanc 2gm IV x 1 then 1500mg  IV Q24H F/u renal fxn, C&S, clinical status and trough at SS  Height: 5\' 10"  (177.8 cm) Weight: 300 lb (136.1 kg) IBW/kg (Calculated) : 73  Temp (24hrs), Avg:98.3 F (36.8 C), Min:98.3 F (36.8 C), Max:98.3 F (36.8 C)   Recent Labs Lab 12/29/16 1916 12/29/16 1928  WBC 13.8*  --   CREATININE 1.63*  --   LATICACIDVEN  --  1.65    Estimated Creatinine Clearance: 58.6 mL/min (by C-G formula based on SCr of 1.63 mg/dL (H)).    Allergies  Allergen Reactions  . Amlodipine Besylate Swelling and Other (See Comments)    "started off low and ended up severe; if, in fact, that is what's causing the swelling" (06/03/12)  . Levaquin [Levofloxacin In D5w] Rash    Antimicrobials this admission: Vanc 1/22>>  Dose adjustments this admission: N/A  Microbiology results: Pending  Thank you for allowing pharmacy to be a part of this patient's care.  Inez Rosato, Drake LeachRachel Lynn 12/29/2016 8:30 PM

## 2016-12-29 NOTE — ED Notes (Signed)
Patient transported to X-ray 

## 2016-12-29 NOTE — Care Management Note (Addendum)
Case Management Note  Patient Details  Name: Jesus [name] Uncapher MRN: [id number] Date of Birth: 1945/12/31  Subjective/Objective:       ED CM reviewed patient's record. [facility] to [facility]. ED staff concerned about patient having an unstagable sacral decub which was not not mentioned in patient's discharge summary on 12/04/16.   CM reviewed record patient last weight documented at 300lbs. Noted  progress note  on 12/18/16 where  sacral wound was addressed, and plan was made to start Levaquin 500 mg 1 tab by mouth daily 10 days and Florastor 250 mg 1 capsule PO BID X 13 days.  Also to continue wound treatment daily, keep skin clean and dry, turn to sides when in bed, start decubivite 1 tab by mouth daily.  [facility] with AMS,  WBC 13.8 and foul smelling sacral wound.             Action/Plan:  Wound consult pending. Unit CM will continue to follow for transitional care planning.  Expected Discharge Date:                  Expected Discharge Plan:  Skilled Nursing Facility [facility])  In-House Referral:  Clinical Social Work  Discharge planning Services  CM Consult  Post Acute Care Choice:    Choice offered to:  Patient  DME Arranged:    DME Agency:     HH Arranged:    HH Agency:     Status of Service:  In process, will continue to follow  If discussed at Long Length of Stay Meetings, dates discussed:    Additional CommentsMichel Bickers:  Leida Luton, RN 12/29/2016, 10:10 PM

## 2016-12-29 NOTE — ED Notes (Signed)
[employer] ordered to go over hospital bed.

## 2016-12-29 NOTE — ED Notes (Signed)
Hospital bed ordered.

## 2016-12-29 NOTE — ED Triage Notes (Signed)
[facility] with reports of AMS and abnormal lab values. Per EMS, facility was unable to report how long the pt has been experiencing confusion. Per EMS, pt had a high WBC and "other abnormal labs". Pt alert to self and time, and per EMS has "moments of clarity" but asks repetitive questions. Per EMS, pt with pressure ulcer to his buttock and cellulitis to BLE. Pt alert. CBG 183, BP 152/84, HR 64, RR 20, RA 96%.

## 2016-12-29 NOTE — ED Provider Notes (Signed)
Patient signed out at end of shift by Dr. Jacqulyn BathLong. [facility] with concern for confusion and elevated WBC count and renal impairment found on labs. No other symptoms, work up essentially negative for source of infection. Of concern, he has a large, deep sacral ulceration with necrosis (Haiku picture in chart). CT currently pending for evaluation of osteo or abscess.  Plan - admission to hospitalist, ?consult to surgery Vanc - cultures done (blood and urine)  CT delayed due to difficult IV. When obtained, there is no bony involvement and no visualized abscess. Re-evaluation of the patient finds him resting comfortably. When updated on results and plan, the patient asks questions that are disoriented and make no sense, i.e. "What town are they from"?   Discussed with hospitalist who accepts the patient to their service.     Elpidio AnisShari Samin Milke, [name] 12/31/16 0021    Maia PlanJoshua G Long, MD 01/02/17 1010

## 2016-12-29 NOTE — ED Notes (Signed)
Delay in getting blood culture #2,  Pt not in room.

## 2016-12-29 NOTE — Care Management Note (Deleted)
Case Management Note  Patient Details  Name: Jesus [name] MRN: [id number] Date of Birth: January 04, 1946  Subjective/Objective:                    Action/Plan:   Expected Discharge Date:                  Expected Discharge Plan:  Skilled Nursing Facility [facility])  In-House Referral:  Clinical Social Work  Discharge planning Services  CM Consult  Post Acute Care Choice:    Choice offered to:  Patient  DME Arranged:    DME Agency:     HH Arranged:    HH Agency:     Status of Service:  In process, will continue to follow  If discussed at Long Length of Stay Meetings, dates discussed:    Additional CommentsMichel [name]:  Jesus Groome, RN 12/29/2016, 10:27 PM

## 2016-12-30 ENCOUNTER — Emergency Department (HOSPITAL_COMMUNITY): Payer: Medicare HMO

## 2016-12-30 DIAGNOSIS — K862 Cyst of pancreas: Secondary | ICD-10-CM | POA: Diagnosis present

## 2016-12-30 DIAGNOSIS — I482 Chronic atrial fibrillation: Secondary | ICD-10-CM | POA: Diagnosis present

## 2016-12-30 DIAGNOSIS — Z8249 Family history of ischemic heart disease and other diseases of the circulatory system: Secondary | ICD-10-CM | POA: Diagnosis not present

## 2016-12-30 DIAGNOSIS — R509 Fever, unspecified: Secondary | ICD-10-CM | POA: Diagnosis not present

## 2016-12-30 DIAGNOSIS — I872 Venous insufficiency (chronic) (peripheral): Secondary | ICD-10-CM | POA: Diagnosis present

## 2016-12-30 DIAGNOSIS — Y92239 Unspecified place in hospital as the place of occurrence of the external cause: Secondary | ICD-10-CM | POA: Diagnosis not present

## 2016-12-30 DIAGNOSIS — L97909 Non-pressure chronic ulcer of unspecified part of unspecified lower leg with unspecified severity: Secondary | ICD-10-CM | POA: Diagnosis present

## 2016-12-30 DIAGNOSIS — G9340 Encephalopathy, unspecified: Secondary | ICD-10-CM | POA: Diagnosis present

## 2016-12-30 DIAGNOSIS — I480 Paroxysmal atrial fibrillation: Secondary | ICD-10-CM | POA: Diagnosis not present

## 2016-12-30 DIAGNOSIS — I5032 Chronic diastolic (congestive) heart failure: Secondary | ICD-10-CM | POA: Diagnosis not present

## 2016-12-30 DIAGNOSIS — Z66 Do not resuscitate: Secondary | ICD-10-CM | POA: Diagnosis present

## 2016-12-30 DIAGNOSIS — Z7901 Long term (current) use of anticoagulants: Secondary | ICD-10-CM | POA: Diagnosis not present

## 2016-12-30 DIAGNOSIS — R4182 Altered mental status, unspecified: Secondary | ICD-10-CM | POA: Diagnosis present

## 2016-12-30 DIAGNOSIS — Z87891 Personal history of nicotine dependence: Secondary | ICD-10-CM | POA: Diagnosis not present

## 2016-12-30 DIAGNOSIS — R410 Disorientation, unspecified: Secondary | ICD-10-CM | POA: Diagnosis present

## 2016-12-30 DIAGNOSIS — Z888 Allergy status to other drugs, medicaments and biological substances status: Secondary | ICD-10-CM | POA: Diagnosis not present

## 2016-12-30 DIAGNOSIS — D649 Anemia, unspecified: Secondary | ICD-10-CM | POA: Diagnosis not present

## 2016-12-30 DIAGNOSIS — Z6841 Body Mass Index (BMI) 40.0 and over, adult: Secondary | ICD-10-CM | POA: Diagnosis not present

## 2016-12-30 DIAGNOSIS — N183 Chronic kidney disease, stage 3 (moderate): Secondary | ICD-10-CM

## 2016-12-30 DIAGNOSIS — R938 Abnormal findings on diagnostic imaging of other specified body structures: Secondary | ICD-10-CM | POA: Diagnosis not present

## 2016-12-30 DIAGNOSIS — I130 Hypertensive heart and chronic kidney disease with heart failure and stage 1 through stage 4 chronic kidney disease, or unspecified chronic kidney disease: Secondary | ICD-10-CM | POA: Diagnosis present

## 2016-12-30 DIAGNOSIS — Z79899 Other long term (current) drug therapy: Secondary | ICD-10-CM | POA: Diagnosis not present

## 2016-12-30 DIAGNOSIS — L89150 Pressure ulcer of sacral region, unstageable: Secondary | ICD-10-CM | POA: Diagnosis present

## 2016-12-30 DIAGNOSIS — N179 Acute kidney failure, unspecified: Secondary | ICD-10-CM | POA: Diagnosis present

## 2016-12-30 DIAGNOSIS — E785 Hyperlipidemia, unspecified: Secondary | ICD-10-CM | POA: Diagnosis present

## 2016-12-30 DIAGNOSIS — I5033 Acute on chronic diastolic (congestive) heart failure: Secondary | ICD-10-CM | POA: Diagnosis present

## 2016-12-30 DIAGNOSIS — L089 Local infection of the skin and subcutaneous tissue, unspecified: Secondary | ICD-10-CM | POA: Diagnosis present

## 2016-12-30 DIAGNOSIS — E46 Unspecified protein-calorie malnutrition: Secondary | ICD-10-CM | POA: Diagnosis present

## 2016-12-30 DIAGNOSIS — K7460 Unspecified cirrhosis of liver: Secondary | ICD-10-CM | POA: Diagnosis present

## 2016-12-30 DIAGNOSIS — Y838 Other surgical procedures as the cause of abnormal reaction of the patient, or of later complication, without mention of misadventure at the time of the procedure: Secondary | ICD-10-CM | POA: Diagnosis not present

## 2016-12-30 DIAGNOSIS — Z515 Encounter for palliative care: Secondary | ICD-10-CM | POA: Diagnosis present

## 2016-12-30 DIAGNOSIS — K769 Liver disease, unspecified: Secondary | ICD-10-CM | POA: Diagnosis present

## 2016-12-30 DIAGNOSIS — F0390 Unspecified dementia without behavioral disturbance: Secondary | ICD-10-CM | POA: Diagnosis present

## 2016-12-30 DIAGNOSIS — E870 Hyperosmolality and hypernatremia: Secondary | ICD-10-CM | POA: Diagnosis not present

## 2016-12-30 DIAGNOSIS — L7622 Postprocedural hemorrhage and hematoma of skin and subcutaneous tissue following other procedure: Secondary | ICD-10-CM | POA: Diagnosis not present

## 2016-12-30 DIAGNOSIS — Z800 Family history of malignant neoplasm of digestive organs: Secondary | ICD-10-CM | POA: Diagnosis not present

## 2016-12-30 DIAGNOSIS — D62 Acute posthemorrhagic anemia: Secondary | ICD-10-CM | POA: Diagnosis not present

## 2016-12-30 LAB — LACTIC ACID, PLASMA
Lactic Acid, Venous: 1.1 mmol/L (ref 0.5–1.9)
Lactic Acid, Venous: 1 mmol/L (ref 0.5–1.9)

## 2016-12-30 LAB — CK: CK TOTAL: 29 U/L — AB (ref 49–397)

## 2016-12-30 MED ORDER — ATORVASTATIN CALCIUM 80 MG PO TABS
80.0000 mg | ORAL_TABLET | Freq: Every day | ORAL | Status: DC
  Administered 2016-12-31 – 2017-01-23 (×21): 80 mg via ORAL
  Filled 2016-12-30 (×22): qty 1

## 2016-12-30 MED ORDER — PIPERACILLIN-TAZOBACTAM 3.375 G IVPB
3.3750 g | Freq: Three times a day (TID) | INTRAVENOUS | Status: AC
  Administered 2016-12-30 – 2017-01-11 (×37): 3.375 g via INTRAVENOUS
  Filled 2016-12-30 (×40): qty 50

## 2016-12-30 MED ORDER — APIXABAN 5 MG PO TABS
5.0000 mg | ORAL_TABLET | Freq: Two times a day (BID) | ORAL | Status: DC
  Administered 2016-12-30 – 2016-12-31 (×3): 5 mg via ORAL
  Filled 2016-12-30 (×4): qty 1

## 2016-12-30 MED ORDER — CARVEDILOL 6.25 MG PO TABS
6.2500 mg | ORAL_TABLET | Freq: Two times a day (BID) | ORAL | Status: DC
  Administered 2016-12-30 – 2017-01-23 (×42): 6.25 mg via ORAL
  Filled 2016-12-30 (×43): qty 1

## 2016-12-30 MED ORDER — LORAZEPAM 2 MG/ML IJ SOLN
1.0000 mg | Freq: Once | INTRAMUSCULAR | Status: AC
  Administered 2016-12-30: 1 mg via INTRAVENOUS
  Filled 2016-12-30: qty 1

## 2016-12-30 MED ORDER — IOPAMIDOL (ISOVUE-300) INJECTION 61%
INTRAVENOUS | Status: AC
  Filled 2016-12-30: qty 100

## 2016-12-30 MED ORDER — PANTOPRAZOLE SODIUM 40 MG PO TBEC
40.0000 mg | DELAYED_RELEASE_TABLET | Freq: Two times a day (BID) | ORAL | Status: DC
  Administered 2016-12-30 – 2017-01-07 (×11): 40 mg via ORAL
  Filled 2016-12-30 (×14): qty 1

## 2016-12-30 MED ORDER — MORPHINE SULFATE (PF) 4 MG/ML IV SOLN
4.0000 mg | Freq: Once | INTRAVENOUS | Status: AC
  Administered 2016-12-30: 4 mg via INTRAVENOUS
  Filled 2016-12-30: qty 1

## 2016-12-30 MED ORDER — SODIUM CHLORIDE 0.9 % IV BOLUS (SEPSIS)
500.0000 mL | Freq: Once | INTRAVENOUS | Status: AC
  Administered 2016-12-30: 500 mL via INTRAVENOUS

## 2016-12-30 MED ORDER — QUETIAPINE FUMARATE 25 MG PO TABS
25.0000 mg | ORAL_TABLET | Freq: Two times a day (BID) | ORAL | Status: DC
  Administered 2016-12-30 – 2017-01-04 (×9): 25 mg via ORAL
  Filled 2016-12-30 (×11): qty 1

## 2016-12-30 MED ORDER — ACETAMINOPHEN 325 MG PO TABS
650.0000 mg | ORAL_TABLET | Freq: Four times a day (QID) | ORAL | Status: DC | PRN
  Administered 2017-01-18 – 2017-01-19 (×2): 650 mg via ORAL
  Filled 2016-12-30 (×2): qty 2

## 2016-12-30 MED ORDER — SODIUM CHLORIDE 0.9 % IV SOLN
INTRAVENOUS | Status: DC
  Administered 2016-12-30 – 2017-01-01 (×4): via INTRAVENOUS

## 2016-12-30 MED ORDER — DIVALPROEX SODIUM ER 500 MG PO TB24
500.0000 mg | ORAL_TABLET | Freq: Every day | ORAL | Status: DC
  Administered 2016-12-30 – 2017-01-07 (×6): 500 mg via ORAL
  Filled 2016-12-30 (×9): qty 1

## 2016-12-30 MED ORDER — COLLAGENASE 250 UNIT/GM EX OINT
TOPICAL_OINTMENT | Freq: Every day | CUTANEOUS | Status: DC
  Administered 2016-12-30: 13:00:00 via TOPICAL
  Filled 2016-12-30: qty 30

## 2016-12-30 MED ORDER — HYDROCODONE-ACETAMINOPHEN 5-325 MG PO TABS
1.0000 | ORAL_TABLET | Freq: Four times a day (QID) | ORAL | Status: DC | PRN
  Administered 2016-12-30 – 2017-01-23 (×20): 1 via ORAL
  Filled 2016-12-30 (×22): qty 1

## 2016-12-30 MED ORDER — HYDROXYZINE HCL 25 MG PO TABS
25.0000 mg | ORAL_TABLET | Freq: Three times a day (TID) | ORAL | Status: DC | PRN
Start: 2016-12-30 — End: 2017-01-24
  Administered 2017-01-04 – 2017-01-23 (×7): 25 mg via ORAL
  Filled 2016-12-30 (×7): qty 1

## 2016-12-30 MED ORDER — SODIUM CHLORIDE 0.9 % IV SOLN
3.0000 g | Freq: Three times a day (TID) | INTRAVENOUS | Status: DC
  Filled 2016-12-30: qty 3

## 2016-12-30 MED ORDER — FENTANYL CITRATE (PF) 100 MCG/2ML IJ SOLN
50.0000 ug | Freq: Once | INTRAMUSCULAR | Status: AC
  Administered 2016-12-30: 50 ug via INTRAVENOUS
  Filled 2016-12-30: qty 2

## 2016-12-30 NOTE — ED Notes (Signed)
Wound care nurse at bedside to evaluate sacral pressure ulcer

## 2016-12-30 NOTE — Progress Notes (Signed)
[facility]. CSW following for disposition and return to SNF when medically appropriate for discharge.      Enos FlingAshley Boy Delamater, MSW, LCSW [facility] ED/[facility]M Clinical Social Worker [phone]

## 2016-12-30 NOTE — Consult Note (Signed)
[facility] Surgery Consult/Admission Note  Vikram Tillett [facility] 07-19-1946  [id number].    Requesting MD: Dr. Verlee Monte Chief Complaint/Reason for Consult: Infected sacral wound  HPI:  Jesus [name] a 71 y.o.malewith medical history significant of A.Fib, HLD, HTN, sacral decubitus ulcer. Patient present to the [facility] ED via EMS from [place] with c/o confusion and elevated WBC today on routine labs. Pt is confused at baseline according to the nursing staff. He does say his bottom hurts and he thinks he has had a wound for 4-5 months. Pt believes his daughter is the president and he does not know where he is. The pt does know the year. Pt does state he has pain in his buttock. Unable to preform a proper history and ROS as pt is confused and demented at baseline.   ROS:  Review of Systems  Unable to perform ROS: Dementia     Family History  Problem Relation Age of Onset  . Arrhythmia Father   . Hypertension Father   . Cancer Mother     cervical    Past Medical History:  Diagnosis Date  . Arthritis    "normal for my age" (11/13/2016)  . Cellulitis and abscess of leg 11/2016  . Chronic atrial fibrillation ([place])   . Chronic diastolic CHF (congestive heart failure) ([place]) 12/2002   a. EF 50-55% by echo 04/2012  . Edema of lower extremity   . Gout   . H/O: GI bleed    "cause I took too much aspirin"  . High cholesterol   . Hyperglycemia    Noted 05/2012  . Hypertension   . Morbid obesity ([place])   . Venous stasis ulcer ([place])     Past Surgical History:  Procedure Laterality Date  . NO PAST SURGERIES      Social History:  reports that he quit smoking about 51 years ago. His smoking use included Cigarettes. He has never used smokeless tobacco. He reports that he drinks about 8.4 oz of alcohol per week . He reports that he does not use drugs.  Allergies:  Allergies  Allergen Reactions  . Amlodipine Besylate Swelling and Other (See Comments)   "started off low and ended up severe; if, in fact, that is what's causing the swelling" (06/03/12)  . Levaquin [Levofloxacin In D5w] Rash     (Not in a hospital admission)  Blood pressure 129/63, pulse 80, temperature 98.3 F (36.8 C), temperature source Oral, resp. rate 18, height 5' 10" (1.778 m), weight 300 lb (136.1 kg), SpO2 97 %.  Physical Exam  Constitutional: He is well-developed, well-nourished, and in no distress. No distress.  Elderly white male lying in bed  HENT:  Head: Normocephalic and atraumatic.  Eyes: Conjunctivae are normal. Right eye exhibits no discharge. Left eye exhibits no discharge. No scleral icterus.  Cardiovascular: Normal rate and normal heart sounds.  An irregular rhythm present. Exam reveals no gallop and no friction rub.   No murmur heard. Pulses:      Radial pulses are 2+ on the right side, and 2+ on the left side.  Pulmonary/Chest: Effort normal. No respiratory distress.  Abdominal: Soft. Normal appearance and bowel sounds are normal. There is no tenderness.  obese  Neurological: He is alert.  Oriented to time, pt knows it's 2018 but he is not oriented to place or person. He thinks his daughter is the president, and she is 24 years old and getting ready for school  Skin: Skin is warm  and dry. No rash noted. He is not diaphoretic.  Sacral wound with black eschar and foul purulent drainage  Psychiatric: Mood and affect normal.  Nursing note and vitals reviewed.   Results for orders placed or performed during the hospital encounter of 12/29/16 (from the past 48 hour(s))  Comprehensive metabolic panel     Status: Abnormal   Collection Time: 12/29/16  7:16 PM  Result Value Ref Range   Sodium 139 135 - 145 mmol/L   Potassium 3.8 3.5 - 5.1 mmol/L   Chloride 95 (L) 101 - 111 mmol/L   CO2 29 22 - 32 mmol/L   Glucose, Bld 141 (H) 65 - 99 mg/dL   BUN 86 (H) 6 - 20 mg/dL   Creatinine, Ser 1.63 (H) 0.61 - 1.24 mg/dL   Calcium 9.7 8.9 - 10.3 mg/dL   Total  Protein 6.4 (L) 6.5 - 8.1 g/dL   Albumin 2.3 (L) 3.5 - 5.0 g/dL   AST 31 15 - 41 U/L   ALT 23 17 - 63 U/L   Alkaline Phosphatase 200 (H) 38 - 126 U/L   Total Bilirubin 1.5 (H) 0.3 - 1.2 mg/dL   GFR calc non Af Amer 41 (L) >60 mL/min   GFR calc Af Amer 48 (L) >60 mL/min    Comment: (NOTE) The eGFR has been calculated using the CKD EPI equation. This calculation has not been validated in all clinical situations. eGFR's persistently <60 mL/min signify possible Chronic Kidney Disease.    Anion gap 15 5 - 15  Lipase, blood     Status: None   Collection Time: 12/29/16  7:16 PM  Result Value Ref Range   Lipase 27 11 - 51 U/L  CBC with Differential     Status: Abnormal   Collection Time: 12/29/16  7:16 PM  Result Value Ref Range   WBC 13.8 (H) 4.0 - 10.5 K/uL   RBC 4.18 (L) 4.22 - 5.81 MIL/uL   Hemoglobin 11.7 (L) 13.0 - 17.0 g/dL   HCT 35.8 (L) 39.0 - 52.0 %   MCV 85.6 78.0 - 100.0 fL   MCH 28.0 26.0 - 34.0 pg   MCHC 32.7 30.0 - 36.0 g/dL   RDW 16.0 (H) 11.5 - 15.5 %   Platelets 332 150 - 400 K/uL   Neutrophils Relative % 84 %   Neutro Abs 11.6 (H) 1.7 - 7.7 K/uL   Lymphocytes Relative 9 %   Lymphs Abs 1.3 0.7 - 4.0 K/uL   Monocytes Relative 2 %   Monocytes Absolute 0.2 0.1 - 1.0 K/uL   Eosinophils Relative 5 %   Eosinophils Absolute 0.7 0.0 - 0.7 K/uL   Basophils Relative 0 %   Basophils Absolute 0.0 0.0 - 0.1 K/uL  I-Stat CG4 Lactic Acid, ED     Status: None   Collection Time: 12/29/16  7:28 PM  Result Value Ref Range   Lactic Acid, Venous 1.65 0.5 - 1.9 mmol/L  I-stat troponin, ED     Status: None   Collection Time: 12/29/16  7:54 PM  Result Value Ref Range   Troponin i, poc 0.03 0.00 - 0.08 ng/mL   Comment 3            Comment: Due to the release kinetics of cTnI, a negative result within the first hours of the onset of symptoms does not rule out myocardial infarction with certainty. If myocardial infarction is still suspected, repeat the test at appropriate  intervals.   Urinalysis, Routine w reflex  microscopic     Status: Abnormal   Collection Time: 12/29/16  8:53 PM  Result Value Ref Range   Color, Urine YELLOW YELLOW   APPearance CLEAR CLEAR   Specific Gravity, Urine 1.009 1.005 - 1.030   pH 6.0 5.0 - 8.0   Glucose, UA NEGATIVE NEGATIVE mg/dL   Hgb urine dipstick NEGATIVE NEGATIVE   Bilirubin Urine NEGATIVE NEGATIVE   Ketones, ur NEGATIVE NEGATIVE mg/dL   Protein, ur NEGATIVE NEGATIVE mg/dL   Nitrite NEGATIVE NEGATIVE   Leukocytes, UA MODERATE (A) NEGATIVE   RBC / HPF 0-5 0 - 5 RBC/hpf   WBC, UA 0-5 0 - 5 WBC/hpf   Bacteria, UA RARE (A) NONE SEEN   Squamous Epithelial / LPF NONE SEEN NONE SEEN  Lactic acid, plasma     Status: None   Collection Time: 12/30/16  4:10 AM  Result Value Ref Range   Lactic Acid, Venous 1.1 0.5 - 1.9 mmol/L  Lactic acid, plasma     Status: None   Collection Time: 12/30/16  6:18 AM  Result Value Ref Range   Lactic Acid, Venous 1.0 0.5 - 1.9 mmol/L  CK     Status: Abnormal   Collection Time: 12/30/16  6:18 AM  Result Value Ref Range   Total CK 29 (L) 49 - 397 U/L   Dg Chest 2 View  Result Date: 12/29/2016 CLINICAL DATA:  Confusion and difficulty breathing EXAM: CHEST  2 VIEW COMPARISON:  11/13/2016 FINDINGS: Cardiac shadow is enlarged. The lungs are well aerated bilaterally. No focal infiltrate or sizable effusion is seen. No bony abnormality is noted. Old rib fractures are noted on the right. IMPRESSION: No active cardiopulmonary disease. Electronically Signed   By: Inez Catalina M.D.   On: 12/29/2016 20:54   Ct Abdomen Pelvis W Contrast  Result Date: 12/30/2016 CLINICAL DATA:  71 year old male with sacral ulcer and pain. EXAM: CT ABDOMEN AND PELVIS WITH CONTRAST TECHNIQUE: Multidetector CT imaging of the abdomen and pelvis was performed using the standard protocol following bolus administration of intravenous contrast. CONTRAST:  75 cc Omnipaque 300 COMPARISON:  None. FINDINGS: Lower chest: The  visualized lung bases are clear. There is moderate cardiomegaly with biatrial dilatation and enlargement of the left ventricle. Multi vessel coronary vascular calcification. No intra-abdominal free air or free fluid. Hepatobiliary: Multiple hepatic hypodense lesions measuring up to 3.9 x 4.3 cm in the left lobe of the liver. The larger lesions demonstrate fluid attenuation and likely represents cyst or hemangioma. Smaller lesions are not well characterized. There is slight irregular appearance of the hepatic contour suggestive of underlying cirrhosis. Correlation with clinical exam recommended. Nonemergent MRI without and with contrast is recommended for further evaluation of the hepatic lesions. There is no intrahepatic biliary ductal dilatation. Layering sludge or hyperconcentrated bile noted within the gallbladder. No pericholecystic fluid. Pancreas: There is a 3.8 x 4.5 cm low attenuating/cystic lesion arising from the posterior uncinate process of the pancreas. This lesion may represent a pseudocyst or a cystic pancreatic neoplasm. MRI without and with contrast is recommended for further characterization. There is no associated gland atrophy or ductal dilatation. Spleen: Normal in size without focal abnormality. Adrenals/Urinary Tract: The adrenal glands appear unremarkable. There is a 2 cm left renal inferior pole hypodense lesion which is incompletely characterized but demonstrates fluid attenuation and likely represents a cyst. There is no hydronephrosis or nephrolithiasis on either side. The visualized ureters appear unremarkable. The urinary bladder is decompressed around a Foley catheter. Stomach/Bowel: Large amount of  stool noted within the colon. There small scattered colonic diverticula without active inflammatory changes. There is no evidence of bowel obstruction or active inflammation. Normal appendix. Vascular/Lymphatic: Advanced aortoiliac atherosclerotic disease. There is a 3 cm infrarenal  abdominal aortic aneurysm. No portal venous gas identified. There is no adenopathy. Reproductive: The prostate and seminal vesicles are grossly unremarkable. Other: There is ulceration of the skin over the distal sacrum with induration of the subcutaneous fat as well as pockets of air. No drainable fluid collection or abscess identified. The soft tissue air extends into the tissues abutting the sacral bone. There is no erosive changes or evidence of osteomyelitis by CT. Musculoskeletal: There is degenerative changes of the spine. Old right rib fractures. No acute fracture. IMPRESSION: Sacral ulcer with small pockets of air in the soft tissues superficial to the distal sacral bone. No drainable fluid collection/ abscess identified. No evidence of osteomyelitis by CT. Irregular hepatic contour most compatible with cirrhosis. Multiple hepatic hypodense lesions are not characterized on the CT. Cystic lesion arising from the uncinate process of the pancreas. MRI without and with contrast is recommended for further evaluation of the hepatic and pancreatic lesions. Constipation. No bowel obstruction or active inflammation. Normal appendix. A 3 cm infrarenal abdominal aortic aneurysm. Recommend followup by ultrasound in 3 years. This recommendation follows ACR consensus guidelines: White Paper of the ACR Incidental Findings Committee II on Vascular Findings. J Am Coll Radiol 2013; 65:993-570 Electronically Signed   By: Anner Crete M.D.   On: 12/30/2016 03:35      Assessment/Plan  Delirium/dementia - informed this was his baseline A.Fib  - Stop Eliquis Heparin drip. AKI on CKD stage 3  Chronic diastolic CHF  Chronic venous insufficiency   Sacral wound - CT shows Sacral ulcer with small pockets of air in the soft tissues superficial to the distal sacral bone. No drainable fluid collection/ abscess identified. No evidence of osteomyelitis by CT. - wound will need debridement and possible I&D - Holding  eliquis, will likely take pt to the OR in 2-3 days  ID: Vanc and Zosyn 1/23>>  Plan: will need surgery but need to wait 48hrs for the effects of Eliquis to wear off. On heparin drip. Hold heparin at least 6 hours prior to surgery. We will continue to follow this pt. Thank you for the consult.   Kalman Drape, [facility] [place] [facility] 12/30/2016, 2:56 PM Pager: [phone] Consults: [phone] Mon-Fri 7:00 am-4:30 pm Sat-Sun 7:00 am-11:30 am

## 2016-12-30 NOTE — ED Notes (Signed)
Attempted report. RN to call back 

## 2016-12-30 NOTE — Progress Notes (Addendum)
Pharmacy Antibiotic Note  Jesus [name] is a 71 y.o. male admitted on 12/29/2016 with wound infection.  Pharmacy has been consulted for Zosyn dosing (already on Vancomycin). WBC mildly elevated. Noted renal dysfunction with normalized CrCL ~40-45.   Plan: -Zosyn 3.375G IV q8h to be infused over 4 hours -Already on vancomycin  -Trend WBC, temp, renal function   Height: 5\' 10"  (177.8 cm) Weight: 300 lb (136.1 kg) IBW/kg (Calculated) : 73  Temp (24hrs), Avg:98.3 F (36.8 C), Min:98.3 F (36.8 C), Max:98.3 F (36.8 C)   Recent Labs Lab 12/29/16 1916 12/29/16 1928 12/30/16 0410  WBC 13.8*  --   --   CREATININE 1.63*  --   --   LATICACIDVEN  --  1.65 1.1    Estimated Creatinine Clearance: 58.6 mL/min (by C-G formula based on SCr of 1.63 mg/dL (H)).    Allergies  Allergen Reactions  . Amlodipine Besylate Swelling and Other (See Comments)    "started off low and ended up severe; if, in fact, that is what's causing the swelling" (06/03/12)  . Levaquin [Levofloxacin In D5w] Rash    Jesus [name], Jesus [name] 12/30/2016 5:14 AM

## 2016-12-30 NOTE — ED Notes (Signed)
Pt very agitated in the bed, continues to remove cardiac monitor leads, pulse ox, and blood pressure cuff. Pt is able to be redirected and monitors reattached at this time.

## 2016-12-30 NOTE — ED Notes (Signed)
Spoke w/ wound nurse, will come evaluate pt in next hour.

## 2016-12-30 NOTE — Consult Note (Addendum)
WOC Nurse wound consult note [facility] for his LE wounds, and need for compression therapy.  Reason for Consult: buttock pressure injury Wound type: Unstageable Pressure Injury Pressure Injury POA: Yes Measurement: 15cm x 10cm x 0.2cm Wound bed:90% soft, yellow, black, grey necrotic tissue, fluctuant, able to probe one area, fearful much deeper than it appears  Drainage (amount, consistency, odor) copious, foul, purulent Periwound: erythematous Dressing procedure/placement/frequency: Pending surgery evaluation, WOC nurse contacted hospitalist to recommend surgical evaluation. Add enzymatic debridement agent, cover with moist gauze, secure with tape. Change daily. Will ask ED staff to hold off on any dressing changes until surgery evaluate patient.   Add low air loss mattress for pressure redistribution, will ask ED secretary to order low air loss mattress.  Patient is noted to have bilateral 4 layer compression wraps on his LE, [facility] changes and any need for topical care for the LE, and address in orders once I have more information.   WOC Nurse team will follow along with you for weekly wound assessments.  Please notify me of any acute changes in the wounds or any new areas of concerns [name] MSN, RN,CWOCN, CNS [phone]

## 2016-12-30 NOTE — ED Notes (Signed)
Surgical team at bedside

## 2016-12-30 NOTE — ED Notes (Signed)
Pt turned to LEFT side and pillow placed under bottom. Pt states he is comfortable right now

## 2016-12-30 NOTE — ED Notes (Signed)
Pt placed in air bed.

## 2016-12-30 NOTE — ED Notes (Signed)
Patient transported to CT 

## 2016-12-30 NOTE — ED Notes (Signed)
Pt spo2 decreased to 86% on RA while sleeping. Pt placed on 2lpm [place] and spo2 93%

## 2016-12-30 NOTE — Progress Notes (Signed)
PROGRESS NOTE  Jesus [name]  [name]:[id number] DOB: February 04, 1946 DOA: 12/29/2016 PCP: Jesus [name] Outpatient Specialists:  Subjective: Patient is disoriented, awake alert awake but disoriented.  Brief Narrative:  Jesus [name] is a 71 y.o. male with medical history significant of A.Fib, HLD, HTN, sacral decubitus ulcer.  Patient presents [facility] the ED via [facility] with c/o confusion and elevated WBC today on routine labs.  It is unclear how long patient has been confused for and patient isnt able to recall specific details regarding how long his symptoms have been ongoing.  Does describe severe pain in buttock area and pain "everywhere" when he tries to move his legs (the latter presumably from the known contractures he has).  Assessment & Plan:   Principal Problem:   Decubitus ulcer of sacral region, unstageable [facility](HCC) Active Problems:   Atrial fibrillation (HCC)   Chronic venous insufficiency   Chronic diastolic CHF (congestive heart failure) (HCC)   Acute on chronic renal failure (HCC)   Delirium   Decubitus ulcer of sacral region Large sacral decubitus ulcer stage III versus stage IV Seen by W CT and recommended general surgery consultation, general surgery consulted. Started on vancomycin and Zosyn, will continue. Air mattress. Await GEN surgery recommendation might need debridement.  Delirium - presumably secondary to # 1 above Continue home meds Treatment as above  A.Fib  CHA2DS2-VASc score is 24 HTN and CHF, on Eliquis. Rate is controlled with Coreg Stop Eliquis as patient might need debridement by general surgery. Heparin drip.  AKI on CKD stage 3  Creatinine baseline 1.4, presented with creatinine 1.6 very close to baseline. Diuretics held, given IV fluids, follow volume closely as he has history of disordered CHF.  Chronic diastolic CHF  Holding lasix and spironolactone for AKI  Chronic venous insufficiency  wound care consulted  for #1 above   DVT prophylaxis:  Code Status: Full Code Family Communication:  Disposition Plan:  Diet: Diet Heart Room service appropriate? Yes; Fluid consistency: Thin  Consultants:   CCS  Procedures:   None  Antimicrobials:   Vanc and Zosyn   Objective: Vitals:   12/30/16 0730 12/30/16 1024 12/30/16 1027 12/30/16 1315  BP: 123/82 (!) 129/54 (!) 129/54 129/63  Pulse: 81 77 77 80  Resp: 24  20 18   Temp:      TempSrc:      SpO2: 94%  94% 97%  Weight:      Height:        Intake/Output Summary (Last 24 hours) at 12/30/16 1352 Last data filed at 12/30/16 1020  Gross per 24 hour  Intake              550 ml  Output             1350 ml  Net             -800 ml   Filed Weights   12/29/16 1827  Weight: 136.1 kg (300 lb)    Examination: General exam: Appears calm and comfortable  Respiratory system: Clear to auscultation. Respiratory effort normal. Cardiovascular system: S1 & S2 heard, RRR. No JVD, murmurs, rubs, gallops or clicks. No pedal edema. Gastrointestinal system: Abdomen is nondistended, soft and nontender. No organomegaly or masses felt. Normal bowel sounds heard. Central nervous system: Alert and oriented. No focal neurological deficits. Extremities: Symmetric 5 x 5 power. Skin: No rashes, lesions or ulcers Psychiatry: Judgement and insight appear normal. Mood & affect appropriate.   Data Reviewed:  I have personally reviewed following labs and imaging studies  CBC:  Recent Labs Lab 12/29/16 1916  WBC 13.8*  NEUTROABS 11.6*  HGB 11.7*  HCT 35.8*  MCV 85.6  PLT 332   Basic Metabolic Panel:  Recent Labs Lab 12/29/16 1916  NA 139  K 3.8  CL 95*  CO2 29  GLUCOSE 141*  BUN 86*  CREATININE 1.63*  CALCIUM 9.7   GFR: Estimated Creatinine Clearance: 58.6 mL/min (by C-G formula based on SCr of 1.63 mg/dL (H)). Liver Function Tests:  Recent Labs Lab 12/29/16 1916  AST 31  ALT 23  ALKPHOS 200*  BILITOT 1.5*  PROT 6.4*  ALBUMIN 2.3*      Recent Labs Lab 12/29/16 1916  LIPASE 27   No results for input(s): AMMONIA in the last 168 hours. Coagulation Profile: No results for input(s): INR, PROTIME in the last 168 hours. Cardiac Enzymes:  Recent Labs Lab 12/30/16 0618  CKTOTAL 29*   BNP (last 3 results) No results for input(s): PROBNP in the last 8760 hours. HbA1C: No results for input(s): HGBA1C in the last 72 hours. CBG: No results for input(s): GLUCAP in the last 168 hours. Lipid Profile: No results for input(s): CHOL, HDL, LDLCALC, TRIG, CHOLHDL, LDLDIRECT in the last 72 hours. Thyroid Function Tests: No results for input(s): TSH, T4TOTAL, FREET4, T3FREE, THYROIDAB in the last 72 hours. Anemia Panel: No results for input(s): VITAMINB12, FOLATE, FERRITIN, TIBC, IRON, RETICCTPCT in the last 72 hours. Urine analysis:    Component Value Date/Time   COLORURINE YELLOW 12/29/2016 2053   APPEARANCEUR CLEAR 12/29/2016 2053   LABSPEC 1.009 12/29/2016 2053   PHURINE 6.0 12/29/2016 2053   GLUCOSEU NEGATIVE 12/29/2016 2053   HGBUR NEGATIVE 12/29/2016 2053   BILIRUBINUR NEGATIVE 12/29/2016 2053   KETONESUR NEGATIVE 12/29/2016 2053   PROTEINUR NEGATIVE 12/29/2016 2053   UROBILINOGEN 4.0 (H) 06/02/2012 1200   NITRITE NEGATIVE 12/29/2016 2053   LEUKOCYTESUR MODERATE (A) 12/29/2016 2053   Sepsis Labs: @LABRCNTIP (procalcitonin:4,lacticidven:4)  )No results found for this or any previous visit (from the past 240 hour(s)).   Invalid input(s): PROCALCITONIN, LACTICACIDVEN   Radiology Studies: Dg Chest 2 View  Result Date: 12/29/2016 CLINICAL DATA:  Confusion and difficulty breathing EXAM: CHEST  2 VIEW COMPARISON:  11/13/2016 FINDINGS: Cardiac shadow is enlarged. The lungs are well aerated bilaterally. No focal infiltrate or sizable effusion is seen. No bony abnormality is noted. Old rib fractures are noted on the right. IMPRESSION: No active cardiopulmonary disease. Electronically Signed   By: Alcide Clever M.D.    On: 12/29/2016 20:54   Ct Abdomen Pelvis W Contrast  Result Date: 12/30/2016 CLINICAL DATA:  71 year old male with sacral ulcer and pain. EXAM: CT ABDOMEN AND PELVIS WITH CONTRAST TECHNIQUE: Multidetector CT imaging of the abdomen and pelvis was performed using the standard protocol following bolus administration of intravenous contrast. CONTRAST:  75 cc Omnipaque 300 COMPARISON:  None. FINDINGS: Lower chest: The visualized lung bases are clear. There is moderate cardiomegaly with biatrial dilatation and enlargement of the left ventricle. Multi vessel coronary vascular calcification. No intra-abdominal free air or free fluid. Hepatobiliary: Multiple hepatic hypodense lesions measuring up to 3.9 x 4.3 cm in the left lobe of the liver. The larger lesions demonstrate fluid attenuation and likely represents cyst or hemangioma. Smaller lesions are not well characterized. There is slight irregular appearance of the hepatic contour suggestive of underlying cirrhosis. Correlation with clinical exam recommended. Nonemergent MRI without and with contrast is recommended for further evaluation of the  hepatic lesions. There is no intrahepatic biliary ductal dilatation. Layering sludge or hyperconcentrated bile noted within the gallbladder. No pericholecystic fluid. Pancreas: There is a 3.8 x 4.5 cm low attenuating/cystic lesion arising from the posterior uncinate process of the pancreas. This lesion may represent a pseudocyst or a cystic pancreatic neoplasm. MRI without and with contrast is recommended for further characterization. There is no associated gland atrophy or ductal dilatation. Spleen: Normal in size without focal abnormality. Adrenals/Urinary Tract: The adrenal glands appear unremarkable. There is a 2 cm left renal inferior pole hypodense lesion which is incompletely characterized but demonstrates fluid attenuation and likely represents a cyst. There is no hydronephrosis or nephrolithiasis on either side. The  visualized ureters appear unremarkable. The urinary bladder is decompressed around a Foley catheter. Stomach/Bowel: Large amount of stool noted within the colon. There small scattered colonic diverticula without active inflammatory changes. There is no evidence of bowel obstruction or active inflammation. Normal appendix. Vascular/Lymphatic: Advanced aortoiliac atherosclerotic disease. There is a 3 cm infrarenal abdominal aortic aneurysm. No portal venous gas identified. There is no adenopathy. Reproductive: The prostate and seminal vesicles are grossly unremarkable. Other: There is ulceration of the skin over the distal sacrum with induration of the subcutaneous fat as well as pockets of air. No drainable fluid collection or abscess identified. The soft tissue air extends into the tissues abutting the sacral bone. There is no erosive changes or evidence of osteomyelitis by CT. Musculoskeletal: There is degenerative changes of the spine. Old right rib fractures. No acute fracture. IMPRESSION: Sacral ulcer with small pockets of air in the soft tissues superficial to the distal sacral bone. No drainable fluid collection/ abscess identified. No evidence of osteomyelitis by CT. Irregular hepatic contour most compatible with cirrhosis. Multiple hepatic hypodense lesions are not characterized on the CT. Cystic lesion arising from the uncinate process of the pancreas. MRI without and with contrast is recommended for further evaluation of the hepatic and pancreatic lesions. Constipation. No bowel obstruction or active inflammation. Normal appendix. A 3 cm infrarenal abdominal aortic aneurysm. Recommend followup by ultrasound in 3 years. This recommendation follows ACR consensus guidelines: White Paper of the ACR Incidental Findings Committee II on Vascular Findings. J Am Coll Radiol 2013; 40:981-191 Electronically Signed   By: Elgie Collard M.D.   On: 12/30/2016 03:35        Scheduled Meds: . apixaban  5 mg Oral  BID  . atorvastatin  80 mg Oral q1800  . carvedilol  6.25 mg Oral BID WC  . collagenase   Topical Daily  . divalproex  500 mg Oral QHS  . iopamidol      . pantoprazole  40 mg Oral BID  . QUEtiapine  25 mg Oral BID   Continuous Infusions: . sodium chloride 125 mL/hr at 12/30/16 0545  . piperacillin-tazobactam (ZOSYN)  IV Stopped (12/30/16 1020)  . vancomycin       LOS: 0 days    Time spent: 35 minutes    Madellyn Denio A, MD [facility] Pager [phone]  If 7PM-7AM, please contact night-coverage [email or web address] Password [email or web address] 12/30/2016, 1:52 PM

## 2016-12-30 NOTE — H&P (Signed)
History and Physical    Jesus [name] ZOX:[id number] DOB: 1946-02-27 DOA: 12/29/2016   PCP: Miki Kins Chief Complaint:  Chief Complaint  Patient presents with  . Altered Mental Status  . Abnormal Lab    HPI: Jesus [name] is a 71 y.o. male with medical history significant of A.Fib, HLD, HTN, sacral decubitus ulcer.  Patient presents to the ED via EMS from [facility] with c/o confusion and elevated WBC today on routine labs.  It is unclear how long patient has been confused for and patient isnt able to recall specific details regarding how long his symptoms have been ongoing.  Does describe severe pain in buttock area and pain "everywhere" when he tries to move his legs (the latter presumably from the known contractures he has).  ED Course: Work up in the ED is significant for an unstageable sacral decubitus ulcer.  Review of Systems: As per HPI otherwise 10 point review of systems negative.    Past Medical History:  Diagnosis Date  . Arthritis    "normal for my age" (11/13/2016)  . Cellulitis and abscess of leg 11/2016  . Chronic atrial fibrillation (HCC)   . Chronic diastolic CHF (congestive heart failure) (HCC) 12/2002   a. EF 50-55% by echo 04/2012  . Edema of lower extremity   . Gout   . H/O: GI bleed    "cause I took too much aspirin"  . High cholesterol   . Hyperglycemia    Noted 05/2012  . Hypertension   . Morbid obesity (HCC)   . Venous stasis ulcer (HCC)     Past Surgical History:  Procedure Laterality Date  . NO PAST SURGERIES       reports that he quit smoking about 51 years ago. His smoking use included Cigarettes. He has never used smokeless tobacco. He reports that he drinks about 8.4 oz of alcohol per week . He reports that he does not use drugs.  Allergies  Allergen Reactions  . Amlodipine Besylate Swelling and Other (See Comments)    "started off low and ended up severe; if, in fact, that is what's causing the swelling"  (06/03/12)  . Levaquin [Levofloxacin In D5w] Rash    Family History  Problem Relation Age of Onset  . Arrhythmia Father   . Hypertension Father   . Cancer Mother     cervical      Prior to Admission medications   Medication Sig Start Date End Date Taking? Authorizing Provider  acetaminophen (TYLENOL) 500 MG tablet Take 500 mg by mouth every 8 (eight) hours as needed for moderate pain (Pain score 4-6/10).    Historical Provider, MD  Amino Acids-Protein Hydrolys (FEEDING SUPPLEMENT, PRO-STAT SUGAR FREE 64,) LIQD Take 30 mLs by mouth 2 (two) times daily. 11/28/16   Joseph Art, DO  apixaban (ELIQUIS) 5 MG TABS tablet Take 1 tablet (5 mg total) by mouth 2 (two) times daily. 07/18/15   Shanker Levora Dredge, MD  atorvastatin (LIPITOR) 80 MG tablet Take 1 tablet (80 mg total) by mouth daily. 07/18/15   Shanker Levora Dredge, MD  carvedilol (COREG) 6.25 MG tablet Take 1 tablet (6.25 mg total) by mouth 2 (two) times daily with a meal. 07/18/15   Shanker Levora Dredge, MD  divalproex (DEPAKOTE SPRINKLE) 125 MG capsule Take 125 mg by mouth 2 (two) times daily. 12/18/16 12/20/16  Historical Provider, MD  divalproex (DEPAKOTE) 250 MG DR tablet Take 250 mg by mouth 3 (three) times daily.  Historical Provider, MD  HYDROcodone-acetaminophen (NORCO/VICODIN) 5-325 MG tablet Take 1 tablet by mouth. Give 1 tablet scheduled at 9AM and 1 tablet q6h prn for severe pain scale of 7/10.    Historical Provider, MD  hydrOXYzine (ATARAX/VISTARIL) 25 MG tablet Take 1 tablet (25 mg total) by mouth 3 (three) times daily as needed (for itching). 11/28/16   Joseph Art, DO  levofloxacin (LEVAQUIN) 500 MG tablet Take 500 mg by mouth daily. x10 days 12/18/16   Historical Provider, MD  pantoprazole (PROTONIX) 40 MG tablet Take 1 tablet (40 mg total) by mouth 2 (two) times daily. 11/28/16   Joseph Art, DO  QUEtiapine (SEROQUEL) 25 MG tablet Take 25 mg by mouth 2 (two) times daily.    Historical Provider, MD  saccharomyces boulardii  (FLORASTOR) 250 MG capsule Take 250 mg by mouth 2 (two) times daily. x13 days 12/18/16   Historical Provider, MD  spironolactone (ALDACTONE) 25 MG tablet Take 1 tablet (25 mg total) by mouth daily. Start 8/11 07/19/15   Shanker Levora Dredge, MD  torsemide (DEMADEX) 100 MG tablet Take 1 tablet (100 mg total) by mouth 2 (two) times daily. 11/28/16   Joseph Art, DO    Physical Exam: Vitals:   12/30/16 0445 12/30/16 0451 12/30/16 0456 12/30/16 0458  BP: 114/70     Pulse: 84     Resp: (!) 29     Temp:      TempSrc:      SpO2:  99% 96% 96%  Weight:      Height:          Constitutional: NAD, calm, comfortable Eyes: PERRL, lids and conjunctivae normal ENMT: Mucous membranes are moist. Posterior pharynx clear of any exudate or lesions.Normal dentition.  Neck: normal, supple, no masses, no thyromegaly Respiratory: clear to auscultation bilaterally, no wheezing, no crackles. Normal respiratory effort. No accessory muscle use.  Cardiovascular: Regular rate and rhythm, no murmurs / rubs / gallops. No extremity edema. 2+ pedal pulses. No carotid bruits.  Abdomen: no tenderness, no masses palpated. No hepatosplenomegaly. Bowel sounds positive.  Musculoskeletal: no clubbing / cyanosis. No joint deformity upper and lower extremities. Good ROM, no contractures. Normal muscle tone.  Skin:  Smells like Pseudomonas Neurologic: CN 2-12 grossly intact. Sensation intact, DTR normal. Strength 5/5 in all 4.  Psychiatric: Oriented to person, knows he is in [place], does not know month or year.   Labs on Admission: I have personally reviewed following labs and imaging studies  CBC:  Recent Labs Lab 12/29/16 1916  WBC 13.8*  NEUTROABS 11.6*  HGB 11.7*  HCT 35.8*  MCV 85.6  PLT 332   Basic Metabolic Panel:  Recent Labs Lab 12/29/16 1916  NA 139  K 3.8  CL 95*  CO2 29  GLUCOSE 141*  BUN 86*  CREATININE 1.63*  CALCIUM 9.7   GFR: Estimated Creatinine Clearance: 58.6 mL/min (by C-G  formula based on SCr of 1.63 mg/dL (H)). Liver Function Tests:  Recent Labs Lab 12/29/16 1916  AST 31  ALT 23  ALKPHOS 200*  BILITOT 1.5*  PROT 6.4*  ALBUMIN 2.3*    Recent Labs Lab 12/29/16 1916  LIPASE 27   No results for input(s): AMMONIA in the last 168 hours. Coagulation Profile: No results for input(s): INR, PROTIME in the last 168 hours. Cardiac Enzymes: No results for input(s): CKTOTAL, CKMB, CKMBINDEX, TROPONINI in the last 168 hours. BNP (last 3 results) No results for input(s): PROBNP in the last 8760 hours. HbA1C:  No results for input(s): HGBA1C in the last 72 hours. CBG: No results for input(s): GLUCAP in the last 168 hours. Lipid Profile: No results for input(s): CHOL, HDL, LDLCALC, TRIG, CHOLHDL, LDLDIRECT in the last 72 hours. Thyroid Function Tests: No results for input(s): TSH, T4TOTAL, FREET4, T3FREE, THYROIDAB in the last 72 hours. Anemia Panel: No results for input(s): VITAMINB12, FOLATE, FERRITIN, TIBC, IRON, RETICCTPCT in the last 72 hours. Urine analysis:    Component Value Date/Time   COLORURINE YELLOW 12/29/2016 2053   APPEARANCEUR CLEAR 12/29/2016 2053   LABSPEC 1.009 12/29/2016 2053   PHURINE 6.0 12/29/2016 2053   GLUCOSEU NEGATIVE 12/29/2016 2053   HGBUR NEGATIVE 12/29/2016 2053   BILIRUBINUR NEGATIVE 12/29/2016 2053   KETONESUR NEGATIVE 12/29/2016 2053   PROTEINUR NEGATIVE 12/29/2016 2053   UROBILINOGEN 4.0 (H) 06/02/2012 1200   NITRITE NEGATIVE 12/29/2016 2053   LEUKOCYTESUR MODERATE (A) 12/29/2016 2053   Sepsis Labs: @LABRCNTIP (procalcitonin:4,lacticidven:4) )No results found for this or any previous visit (from the past 240 hour(s)).   Radiological Exams on Admission: Dg Chest 2 View  Result Date: 12/29/2016 CLINICAL DATA:  Confusion and difficulty breathing EXAM: CHEST  2 VIEW COMPARISON:  11/13/2016 FINDINGS: Cardiac shadow is enlarged. The lungs are well aerated bilaterally. No focal infiltrate or sizable effusion is seen.  No bony abnormality is noted. Old rib fractures are noted on the right. IMPRESSION: No active cardiopulmonary disease. Electronically Signed   By: Alcide CleverMark  Lukens M.D.   On: 12/29/2016 20:54   Ct Abdomen Pelvis W Contrast  Result Date: 12/30/2016 CLINICAL DATA:  71 year old male with sacral ulcer and pain. EXAM: CT ABDOMEN AND PELVIS WITH CONTRAST TECHNIQUE: Multidetector CT imaging of the abdomen and pelvis was performed using the standard protocol following bolus administration of intravenous contrast. CONTRAST:  75 cc Omnipaque 300 COMPARISON:  None. FINDINGS: Lower chest: The visualized lung bases are clear. There is moderate cardiomegaly with biatrial dilatation and enlargement of the left ventricle. Multi vessel coronary vascular calcification. No intra-abdominal free air or free fluid. Hepatobiliary: Multiple hepatic hypodense lesions measuring up to 3.9 x 4.3 cm in the left lobe of the liver. The larger lesions demonstrate fluid attenuation and likely represents cyst or hemangioma. Smaller lesions are not well characterized. There is slight irregular appearance of the hepatic contour suggestive of underlying cirrhosis. Correlation with clinical exam recommended. Nonemergent MRI without and with contrast is recommended for further evaluation of the hepatic lesions. There is no intrahepatic biliary ductal dilatation. Layering sludge or hyperconcentrated bile noted within the gallbladder. No pericholecystic fluid. Pancreas: There is a 3.8 x 4.5 cm low attenuating/cystic lesion arising from the posterior uncinate process of the pancreas. This lesion may represent a pseudocyst or a cystic pancreatic neoplasm. MRI without and with contrast is recommended for further characterization. There is no associated gland atrophy or ductal dilatation. Spleen: Normal in size without focal abnormality. Adrenals/Urinary Tract: The adrenal glands appear unremarkable. There is a 2 cm left renal inferior pole hypodense lesion  which is incompletely characterized but demonstrates fluid attenuation and likely represents a cyst. There is no hydronephrosis or nephrolithiasis on either side. The visualized ureters appear unremarkable. The urinary bladder is decompressed around a Foley catheter. Stomach/Bowel: Large amount of stool noted within the colon. There small scattered colonic diverticula without active inflammatory changes. There is no evidence of bowel obstruction or active inflammation. Normal appendix. Vascular/Lymphatic: Advanced aortoiliac atherosclerotic disease. There is a 3 cm infrarenal abdominal aortic aneurysm. No portal venous gas identified. There is no adenopathy.  Reproductive: The prostate and seminal vesicles are grossly unremarkable. Other: There is ulceration of the skin over the distal sacrum with induration of the subcutaneous fat as well as pockets of air. No drainable fluid collection or abscess identified. The soft tissue air extends into the tissues abutting the sacral bone. There is no erosive changes or evidence of osteomyelitis by CT. Musculoskeletal: There is degenerative changes of the spine. Old right rib fractures. No acute fracture. IMPRESSION: Sacral ulcer with small pockets of air in the soft tissues superficial to the distal sacral bone. No drainable fluid collection/ abscess identified. No evidence of osteomyelitis by CT. Irregular hepatic contour most compatible with cirrhosis. Multiple hepatic hypodense lesions are not characterized on the CT. Cystic lesion arising from the uncinate process of the pancreas. MRI without and with contrast is recommended for further evaluation of the hepatic and pancreatic lesions. Constipation. No bowel obstruction or active inflammation. Normal appendix. A 3 cm infrarenal abdominal aortic aneurysm. Recommend followup by ultrasound in 3 years. This recommendation follows ACR consensus guidelines: White Paper of the ACR Incidental Findings Committee II on Vascular  Findings. J Am Coll Radiol 2013; 16:109-604 Electronically Signed   By: Elgie Collard M.D.   On: 12/30/2016 03:35    EKG: Independently reviewed.  Assessment/Plan Principal Problem:   Decubitus ulcer of sacral region, unstageable [facility]) Active Problems:   Atrial fibrillation (HCC)   Chronic venous insufficiency   Chronic diastolic CHF (congestive heart failure) (HCC)   Acute on chronic renal failure (HCC)   Delirium    1. Decubitus ulcer of sacral region - small amount of soft tissue gas on CT scan, surrounding erythema, and smells like pseudomonas 1. EDP started vanc, will add zosyn 2. Wound care consult 3. May or may not require surgical consult (will leave this to wound care's determination) 4. Air mattress 2. Delirium - presumably secondary to # 1 above 1. Continue home meds 2. Treatment as above 3. A.Fib - 1. Continue Eliquis 2. Continue Coreg 4. AKI on CKD stage 3 - pre-renal pattern with BUN of 80 today 1. IVF: got 500cc bolus initially in ed, giving a second 500cc bolus, NS at 125 cc/hr 2. Holding lasix and torsemide 5. Chronic diastolic CHF - 1. Holding lasix and spironolactone for AKI 6. Chronic venous insufficiency - wound care consulted for #1 above   DVT prophylaxis: Eliquis Code Status: Full Family Communication: No family in room Consults called: None Admission status: Admit to inpatient   Hillary Bow DO [facility] Pager [phone] from 7PM-7AM  If 7AM-7PM, please contact the day physician for the patient [email or web address] Password [facility]  12/30/2016, 5:46 AM

## 2016-12-31 ENCOUNTER — Encounter (HOSPITAL_COMMUNITY)
Admission: EM | Disposition: A | Discharge status: Other Acute Inpatient Hospital | Payer: Self-pay | Source: Home / Self Care | Attending: Internal Medicine

## 2016-12-31 ENCOUNTER — Encounter (HOSPITAL_COMMUNITY): Payer: Self-pay | Admitting: General Practice

## 2016-12-31 DIAGNOSIS — L89300 Pressure ulcer of unspecified buttock, unstageable: Secondary | ICD-10-CM

## 2016-12-31 HISTORY — DX: Pressure ulcer of unspecified buttock, unstageable: L89.300

## 2016-12-31 SURGERY — DEBRIDMENT OF DECUBITUS ULCER
Anesthesia: General

## 2016-12-31 MED ORDER — DAKINS (1/4 STRENGTH) 0.125 % EX SOLN
Freq: Two times a day (BID) | CUTANEOUS | Status: AC
Start: 2016-12-31 — End: 2017-01-03
  Administered 2016-12-31 – 2017-01-01 (×3)
  Filled 2016-12-31: qty 473

## 2016-12-31 NOTE — Progress Notes (Signed)
Notes indicate patient's Eliquis was being held and he was on a heparin drip, however this was not the case as those orders were never entered.  Patient received Eliquis 5mg  this morning at 1045.  Spoke with Dr. Rito EhrlichKrishnan who stopped the Eliquis and started SCDs for VTE prophylaxis (discussed need for heparin drip and both decided it was likely not indicated at this time).   Saleena Tamas D. Aayushi Solorzano, PharmD, BCPS Clinical Pharmacist Pager: [phone] 12/31/2016 11:38 AM

## 2016-12-31 NOTE — Plan of Care (Signed)
Problem: Education: Goal: Knowledge of [facility] Education information/materials will improve Outcome: Progressing POC reviewed with pt.; pt. Is confused and info is repeated several times.

## 2016-12-31 NOTE — Consult Note (Addendum)
WOC reviewed notes from surgical team. Plans for surgical debridement in 2-3 days once coagulation status stable.  I have updated the topical wound care for the next few days for chemical debridement and odor control.  Contacted Ailene RudJennifer Sparks wound care treatment [facility] to discuss LEs and current POC.  Awaiting call back.   WOC will follow along with surgical team to evaluate for needs after debridement.   Taelyn Nemes [facility] MSN,RN, [place], [place] [phone]

## 2016-12-31 NOTE — Consult Note (Signed)
WOC Nurse wound follow up Verified with treatment nurse Jesus [name] at [facility], 4 layer compression wraps for edema management and for venous stasis dx.  Changed last per SNF on last Wednesday. Bedside nurse removed old compression wraps per my request prior to my arrival Skin is very dry and I was able to remove large pieces of dry flaky skin Noted Stage 2 Pressure injury on the right heel; will add silicone foam protection under compression wrap  Profore 4 layer compression wraps applied bilaterally. Prevalon heel boots added to offload heels.  CCS team in to re-assess sacral ulcer, plans to debride in a few days in the OR.   WOC team will follow along for support with wound care, compression wraps Jesus [name] [facility] MSN,RN,CWOCN, CNS [phone]

## 2016-12-31 NOTE — Progress Notes (Signed)
[facility] Progress Note     Subjective: No new complaints. Pt lying in bed.  Objective: Vital signs in last 24 hours: Temp:  [98.1 F (36.7 C)-99.2 F (37.3 C)] 98.5 F (36.9 C) (01/24 0903) Pulse Rate:  [73-109] 109 (01/24 0903) Resp:  [16-21] 21 (01/24 0903) BP: (91-145)/(48-75) 91/48 (01/24 0903) SpO2:  [92 %-97 %] 92 % (01/24 0903) Last BM Date:  (unknown per pt.)  Intake/Output from previous day: 01/23 0701 - 01/24 0700 In: 2906.3 [I.V.:2256.3; IV Piggyback:650] Out: 1900 [Urine:1900] Intake/Output this shift: No intake/output data recorded.  PE: Constitutional: He is well-developed, well-nourished, and in no distress. Elderly white male lying in bed  Head: Normocephalic and atraumatic.  Eyes: Conjunctivae are normal. Right eye exhibits no discharge. Left eye exhibits no discharge. No scleral icterus.  Pulmonary/Chest: Effort normal. No respiratory distress.  Abdominal: Soft. obese  Neurological: He is alert.  Skin: Skin is warm and dry. He is not diaphoretic.  Sacral wound with black eschar and foul purulent drainage  Psychiatric: Mood and affect normal.  Nursing note and vitals reviewed.  Lab Results:   Recent Labs  12/29/16 1916  WBC 13.8*  HGB 11.7*  HCT 35.8*  PLT 332   BMET  Recent Labs  12/29/16 1916  NA 139  K 3.8  CL 95*  CO2 29  GLUCOSE 141*  BUN 86*  CREATININE 1.63*  CALCIUM 9.7   PT/INR No results for input(s): LABPROT, INR in the last 72 hours. CMP     Component Value Date/Time   NA 139 12/29/2016 1916   NA 133 (A) 12/04/2016   K 3.8 12/29/2016 1916   CL 95 (L) 12/29/2016 1916   CO2 29 12/29/2016 1916   GLUCOSE 141 (H) 12/29/2016 1916   BUN 86 (H) 12/29/2016 1916   BUN 38 (A) 12/04/2016   CREATININE 1.63 (H) 12/29/2016 1916   CALCIUM 9.7 12/29/2016 1916   PROT 6.4 (L) 12/29/2016 1916   ALBUMIN 2.3 (L) 12/29/2016 1916   AST 31 12/29/2016 1916   ALT 23 12/29/2016 1916   ALKPHOS 200 (H) 12/29/2016 1916   BILITOT 1.5 (H) 12/29/2016 1916   GFRNONAA 41 (L) 12/29/2016 1916   GFRAA 48 (L) 12/29/2016 1916   Lipase     Component Value Date/Time   LIPASE 27 12/29/2016 1916       Studies/Results: Dg Chest 2 View  Result Date: 12/29/2016 CLINICAL DATA:  Confusion and difficulty breathing EXAM: CHEST  2 VIEW COMPARISON:  11/13/2016 FINDINGS: Cardiac shadow is enlarged. The lungs are well aerated bilaterally. No focal infiltrate or sizable effusion is seen. No bony abnormality is noted. Old rib fractures are noted on the right. IMPRESSION: No active cardiopulmonary disease. Electronically Signed   By: Alcide Clever M.D.   On: 12/29/2016 20:54   Ct Abdomen Pelvis W Contrast  Result Date: 12/30/2016 CLINICAL DATA:  71 year old male with sacral ulcer and pain. EXAM: CT ABDOMEN AND PELVIS WITH CONTRAST TECHNIQUE: Multidetector CT imaging of the abdomen and pelvis was performed using the standard protocol following bolus administration of intravenous contrast. CONTRAST:  75 cc Omnipaque 300 COMPARISON:  None. FINDINGS: Lower chest: The visualized lung bases are clear. There is moderate cardiomegaly with biatrial dilatation and enlargement of the left ventricle. Multi vessel coronary vascular calcification. No intra-abdominal free air or free fluid. Hepatobiliary: Multiple hepatic hypodense lesions measuring up to 3.9 x 4.3 cm in the left lobe of the liver. The larger lesions demonstrate fluid attenuation and likely represents  cyst or hemangioma. Smaller lesions are not well characterized. There is slight irregular appearance of the hepatic contour suggestive of underlying cirrhosis. Correlation with clinical exam recommended. Nonemergent MRI without and with contrast is recommended for further evaluation of the hepatic lesions. There is no intrahepatic biliary ductal dilatation. Layering sludge or hyperconcentrated bile noted within the gallbladder. No pericholecystic fluid. Pancreas: There is a 3.8 x 4.5 cm low  attenuating/cystic lesion arising from the posterior uncinate process of the pancreas. This lesion may represent a pseudocyst or a cystic pancreatic neoplasm. MRI without and with contrast is recommended for further characterization. There is no associated gland atrophy or ductal dilatation. Spleen: Normal in size without focal abnormality. Adrenals/Urinary Tract: The adrenal glands appear unremarkable. There is a 2 cm left renal inferior pole hypodense lesion which is incompletely characterized but demonstrates fluid attenuation and likely represents a cyst. There is no hydronephrosis or nephrolithiasis on either side. The visualized ureters appear unremarkable. The urinary bladder is decompressed around a Foley catheter. Stomach/Bowel: Large amount of stool noted within the colon. There small scattered colonic diverticula without active inflammatory changes. There is no evidence of bowel obstruction or active inflammation. Normal appendix. Vascular/Lymphatic: Advanced aortoiliac atherosclerotic disease. There is a 3 cm infrarenal abdominal aortic aneurysm. No portal venous gas identified. There is no adenopathy. Reproductive: The prostate and seminal vesicles are grossly unremarkable. Other: There is ulceration of the skin over the distal sacrum with induration of the subcutaneous fat as well as pockets of air. No drainable fluid collection or abscess identified. The soft tissue air extends into the tissues abutting the sacral bone. There is no erosive changes or evidence of osteomyelitis by CT. Musculoskeletal: There is degenerative changes of the spine. Old right rib fractures. No acute fracture. IMPRESSION: Sacral ulcer with small pockets of air in the soft tissues superficial to the distal sacral bone. No drainable fluid collection/ abscess identified. No evidence of osteomyelitis by CT. Irregular hepatic contour most compatible with cirrhosis. Multiple hepatic hypodense lesions are not characterized on the  CT. Cystic lesion arising from the uncinate process of the pancreas. MRI without and with contrast is recommended for further evaluation of the hepatic and pancreatic lesions. Constipation. No bowel obstruction or active inflammation. Normal appendix. A 3 cm infrarenal abdominal aortic aneurysm. Recommend followup by ultrasound in 3 years. This recommendation follows ACR consensus guidelines: White Paper of the ACR Incidental Findings Committee II on Vascular Findings. J Am Coll Radiol 2013; 16:109-604; 10:789-794 Electronically Signed   By: Elgie CollardArash  Radparvar M.D.   On: 12/30/2016 03:35    Anti-infectives: Anti-infectives    Start     Dose/Rate Route Frequency Ordered Stop   12/30/16 2200  vancomycin (VANCOCIN) 1,500 mg in sodium chloride 0.9 % 500 mL IVPB     1,500 mg 250 mL/hr over 120 Minutes Intravenous Every 24 hours 12/29/16 2029     12/30/16 0600  Ampicillin-Sulbactam (UNASYN) 3 g in sodium chloride 0.9 % 100 mL IVPB  Status:  Discontinued     3 g 200 mL/hr over 30 Minutes Intravenous Every 8 hours 12/30/16 0518 12/30/16 0537   12/30/16 0600  piperacillin-tazobactam (ZOSYN) IVPB 3.375 g     3.375 g 12.5 mL/hr over 240 Minutes Intravenous Every 8 hours 12/30/16 0537     12/29/16 2030  vancomycin (VANCOCIN) 2,000 mg in sodium chloride 0.9 % 500 mL IVPB     2,000 mg 250 mL/hr over 120 Minutes Intravenous  Once 12/29/16 2028 12/29/16 2306  Assessment/Plan   Delirium/dementia - informed this was his baseline A.Fib  - Stop Eliquis Heparin drip. AKI on CKD stage 3  Chronic diastolic CHF  Chronic venous insufficiency   Sacral wound - CT shows Sacral ulcer with small pockets of air in the soft tissues superficial to the distal sacral bone. No drainable fluid collection/ abscess identified. No evidence of osteomyelitis by CT. - wound will need debridement and possible I&D - Holding eliquis, will likely take pt to the OR in 2-3 days  ID: Vanc and Zosyn 1/23>>  Plan: will need surgery  but need to wait 48hrs for the effects of Eliquis to wear off. Pt got a dose of Eliquis today at 10:45 so will need to hold off on surgery until at least Friday. If pt is placed on a heparin drip it will need to be held for least 6 hours prior to surgery.   LOS: 1 day    Jerre Simon , [facility] 12/31/2016, 11:44 AM Pager: [phone] Consults: [phone] Mon-Fri 7:00 am-4:30 pm Sat-Sun 7:00 am-11:30 am

## 2016-12-31 NOTE — Progress Notes (Signed)
Initial Nutrition Assessment  DOCUMENTATION CODES:   Morbid obesity  INTERVENTION:  Once diet advances, RD to order nutritional supplements to aid in wound healing.   NUTRITION DIAGNOSIS:   Increased nutrient needs related to wound healing as evidenced by estimated needs.  GOAL:   Patient will meet greater than or equal to 90% of their needs  MONITOR:   Diet advancement, Labs, Weight trends, Skin, I & O's  REASON FOR ASSESSMENT:   Low Braden    ASSESSMENT:   71 y.o. male with medical history significant of A.Fib, HLD, HTN, sacral decubitus ulcer. [facility] with c/o confusion and elevated WBC today on routine labs. Pt is confused at baseline according to the nursing staff. He does say his bottom hurts and he thinks he has had a wound for 4-5 months. PMH CKD3, CHF. Pt also presents with AKI on CKD3.  Plans for OR debridement once off Eliquis for 48 hours. Pt is currently NPO. No family at bedside. Pt reports having a decreased appetite which has been ongoing over the past 2-3 weeks. Pt unable to provide diet recall or usual meal consumption. Noted pt was a bit confused during time of visit. Weight has been stable per weight records. Pt with a decubitus ulcer of sacral region. RD to order nutritional supplements once diet advances to aid in wound healing.   Nutrition-Focused physical exam completed. Findings are mild to moderate fat depletion, mild to moderate muscle depletion, and mild edema. Malnutrition diagnosis unable to identified at this time as RD unable to obtain strong evidence of usual energy intake from patient. RD to continue to monitor.    Labs and medications reviewed.   Diet Order:  Diet NPO time specified  Skin:  Wound (see comment) (Unstageable to sacrum)  Last BM:  Unknown  Height:   Ht Readings from Last 1 Encounters:  12/29/16 5\' 10"  (1.778 m)    Weight:   Wt Readings from Last 1 Encounters:   12/29/16 300 lb (136.1 kg)    Ideal Body Weight:  75.45 kg  BMI:  Body mass index is 43.05 kg/m.  Estimated Nutritional Needs:   Kcal:  2000-2300  Protein:  120-140 grams  Fluid:  2-2.3 L/day  EDUCATION NEEDS:   No education needs identified at this time  Roslyn SmilingStephanie Aryanna Shaver, MS, RD, LDN Pager # [phone] After hours/ weekend pager # [phone]

## 2016-12-31 NOTE — Progress Notes (Addendum)
[facility] PROGRESS NOTE  Jesus [name] ZOX:[id number] DOB: 05/20/1946 DOA: 12/29/2016  PCP: [name],[name], [name]  Brief History/Interval Summary: Jesus Dragonouglas M Prevetteis a 71 y.o.malewith medical history significant for A.Fib, HLD, HTN, sacral decubitus ulcer. Patient presented [facility] the ED [name] with c/o confusion and elevated WBC on routine labs. Concern was for infection in the sacral decubitus. Patient was hospitalized for management.  Reason for Visit: Infected sacral decubitus  Consultants: Gen. surgery  Procedures: None as yet  Antibiotics: Vancomycin and Zosyn  Subjective/Interval History: Patient is confused. Unable to provide much information.  ROS: Unable to do due to confusion  Objective:  Vital Signs  Vitals:   12/30/16 1315 12/30/16 1737 12/30/16 1909 12/31/16 0903  BP: 129/63 145/64 140/75 (!) 91/48  Pulse: 80 80 73 (!) 109  Resp: 18 16 18  (!) 21  Temp:  98.1 F (36.7 C) 99.2 F (37.3 C) 98.5 F (36.9 C)  TempSrc:  Oral Oral Oral  SpO2: 97% 94% 95% 92%  Weight:      Height:        Intake/Output Summary (Last 24 hours) at 12/31/16 1310 Last data filed at 12/31/16 1042  Gross per 24 hour  Intake          2856.25 ml  Output             1250 ml  Net          1606.25 ml   Filed Weights   12/29/16 1827  Weight: 136.1 kg (300 lb)    General appearance: alert, distracted and no distress Resp: clear to auscultation bilaterally Cardio: regular rate and rhythm, S1, S2 normal, no murmur, click, rub or gallop GI: soft, non-tender; bowel sounds normal; no masses,  no organomegaly Extremities: extremities normal, atraumatic, no cyanosis or edema Unstageable pressure injury to lower back Neurologic: Remains confused. Moving all his extremities. No obvious neurological is noted.  Lab Results:  Data Reviewed: I have personally reviewed following labs and imaging studies  CBC:  Recent Labs Lab 12/29/16 1916    WBC 13.8*  NEUTROABS 11.6*  HGB 11.7*  HCT 35.8*  MCV 85.6  PLT 332    Basic Metabolic Panel:  Recent Labs Lab 12/29/16 1916  NA 139  K 3.8  CL 95*  CO2 29  GLUCOSE 141*  BUN 86*  CREATININE 1.63*  CALCIUM 9.7    GFR: Estimated Creatinine Clearance: 58.6 mL/min (by C-G formula based on SCr of 1.63 mg/dL (H)).  Liver Function Tests:  Recent Labs Lab 12/29/16 1916  AST 31  ALT 23  ALKPHOS 200*  BILITOT 1.5*  PROT 6.4*  ALBUMIN 2.3*     Recent Labs Lab 12/29/16 1916  LIPASE 27   Cardiac Enzymes:  Recent Labs Lab 12/30/16 0618  CKTOTAL 29*     Recent Results (from the past 240 hour(s))  Culture, blood (routine x 2)     Status: None (Preliminary result)   Collection Time: 12/29/16  7:16 PM  Result Value Ref Range Status   Specimen Description BLOOD RIGHT ANTECUBITAL  Final   Special Requests BOTTLES DRAWN AEROBIC AND ANAEROBIC 5CC  Final   Culture NO GROWTH < 24 HOURS  Final   Report Status PENDING  Incomplete  Culture, blood (routine x 2)     Status: None (Preliminary result)   Collection Time: 12/29/16  8:59 PM  Result Value Ref Range Status   Specimen Description BLOOD RIGHT HAND  Final  Special Requests IN PEDIATRIC BOTTLE 4CC  Final   Culture NO GROWTH < 24 HOURS  Final   Report Status PENDING  Incomplete      Radiology Studies: Dg Chest 2 View  Result Date: 12/29/2016 CLINICAL DATA:  Confusion and difficulty breathing EXAM: CHEST  2 VIEW COMPARISON:  11/13/2016 FINDINGS: Cardiac shadow is enlarged. The lungs are well aerated bilaterally. No focal infiltrate or sizable effusion is seen. No bony abnormality is noted. Old rib fractures are noted on the right. IMPRESSION: No active cardiopulmonary disease. Electronically Signed   By: Alcide Clever M.D.   On: 12/29/2016 20:54   Ct Abdomen Pelvis W Contrast  Result Date: 12/30/2016 CLINICAL DATA:  71 year old male with sacral ulcer and pain. EXAM: CT ABDOMEN AND PELVIS WITH CONTRAST  TECHNIQUE: Multidetector CT imaging of the abdomen and pelvis was performed using the standard protocol following bolus administration of intravenous contrast. CONTRAST:  75 cc Omnipaque 300 COMPARISON:  None. FINDINGS: Lower chest: The visualized lung bases are clear. There is moderate cardiomegaly with biatrial dilatation and enlargement of the left ventricle. Multi vessel coronary vascular calcification. No intra-abdominal free air or free fluid. Hepatobiliary: Multiple hepatic hypodense lesions measuring up to 3.9 x 4.3 cm in the left lobe of the liver. The larger lesions demonstrate fluid attenuation and likely represents cyst or hemangioma. Smaller lesions are not well characterized. There is slight irregular appearance of the hepatic contour suggestive of underlying cirrhosis. Correlation with clinical exam recommended. Nonemergent MRI without and with contrast is recommended for further evaluation of the hepatic lesions. There is no intrahepatic biliary ductal dilatation. Layering sludge or hyperconcentrated bile noted within the gallbladder. No pericholecystic fluid. Pancreas: There is a 3.8 x 4.5 cm low attenuating/cystic lesion arising from the posterior uncinate process of the pancreas. This lesion may represent a pseudocyst or a cystic pancreatic neoplasm. MRI without and with contrast is recommended for further characterization. There is no associated gland atrophy or ductal dilatation. Spleen: Normal in size without focal abnormality. Adrenals/Urinary Tract: The adrenal glands appear unremarkable. There is a 2 cm left renal inferior pole hypodense lesion which is incompletely characterized but demonstrates fluid attenuation and likely represents a cyst. There is no hydronephrosis or nephrolithiasis on either side. The visualized ureters appear unremarkable. The urinary bladder is decompressed around a Foley catheter. Stomach/Bowel: Large amount of stool noted within the colon. There small scattered  colonic diverticula without active inflammatory changes. There is no evidence of bowel obstruction or active inflammation. Normal appendix. Vascular/Lymphatic: Advanced aortoiliac atherosclerotic disease. There is a 3 cm infrarenal abdominal aortic aneurysm. No portal venous gas identified. There is no adenopathy. Reproductive: The prostate and seminal vesicles are grossly unremarkable. Other: There is ulceration of the skin over the distal sacrum with induration of the subcutaneous fat as well as pockets of air. No drainable fluid collection or abscess identified. The soft tissue air extends into the tissues abutting the sacral bone. There is no erosive changes or evidence of osteomyelitis by CT. Musculoskeletal: There is degenerative changes of the spine. Old right rib fractures. No acute fracture. IMPRESSION: Sacral ulcer with small pockets of air in the soft tissues superficial to the distal sacral bone. No drainable fluid collection/ abscess identified. No evidence of osteomyelitis by CT. Irregular hepatic contour most compatible with cirrhosis. Multiple hepatic hypodense lesions are not characterized on the CT. Cystic lesion arising from the uncinate process of the pancreas. MRI without and with contrast is recommended for further evaluation of the hepatic  and pancreatic lesions. Constipation. No bowel obstruction or active inflammation. Normal appendix. A 3 cm infrarenal abdominal aortic aneurysm. Recommend followup by ultrasound in 3 years. This recommendation follows ACR consensus guidelines: White Paper of the ACR Incidental Findings Committee II on Vascular Findings. J Am Coll Radiol 2013; 16:109-604 Electronically Signed   By: Elgie Collard M.D.   On: 12/30/2016 03:35     Medications:  Scheduled: . atorvastatin  80 mg Oral q1800  . carvedilol  6.25 mg Oral BID WC  . divalproex  500 mg Oral QHS  . pantoprazole  40 mg Oral BID  . piperacillin-tazobactam (ZOSYN)  IV  3.375 g Intravenous Q8H  .  QUEtiapine  25 mg Oral BID  . sodium hypochlorite   Irrigation BID  . vancomycin  1,500 mg Intravenous Q24H   Continuous: . sodium chloride 125 mL/hr at 12/31/16 1146   VWU:[id number], HYDROcodone-acetaminophen, hydrOXYzine  Assessment/Plan:  Principal Problem:   Decubitus ulcer of sacral region, unstageable [facility]) Active Problems:   Atrial fibrillation (HCC)   Chronic venous insufficiency   Chronic diastolic CHF (congestive heart failure) (HCC)   Acute on chronic renal failure (HCC)   Delirium    Infected Decubitus ulcer of sacral region Large sacral decubitus ulcer, unstageable. Concerns for infection. CT scan did show some HAIR pockets without any evidence for abscess. Wound care was consulted. Subsequently, general surgery was consulted. They plan to do debridement once patient is off of anticoagulation. Continue vancomycin and Zosyn for now.  Acute Delirium Presumably secondary to above. Baseline is not known. Continue to monitor mental status closely.  A.Fib  CHA2DS2-VASc score is 4. Rate is controlled with Coreg. Patient is on Eliquis. Plan is to hold it for now for surgery. No known history of stroke. No clear indication to use heparin infusion.  Likely mild AKI on CKD stage 3  Creatinine baseline 1.4, presented with creatinine 1.6 very close to baseline. Diuretics held, given IV fluids. Repeat labs tomorrow morning. Cut back IV fluids.  Chronic diastolic CHF  Holding lasix and spironolactone for AKI. Cut back on IV fluids. Repeat labs tomorrow morning.  Hepatic and pancreatic lesions Cystic lesions incidentally noted on CT scan. Patient will need further evaluation of this with MRI. Will plan for this prior to discharge if renal function improves.  Chronic venous insufficiency  Wound care consulted.  DVT Prophylaxis: Eliquis on hold. SCDs.    Code Status: Full code  Family Communication: No family at bedside  Disposition Plan: Await surgical debridement.  Continue antibiotics and mentioned above. Activity status is unknown. PT evaluation once more stable.    LOS: 1 day   [facility]  [facility] Pager [phone] 12/31/2016, 1:10 PM  If 7PM-7AM, please contact night-coverage at [email or web address], password [facility]

## 2016-12-31 NOTE — Care Management Note (Addendum)
Case Management Note  Patient Details  Name: Jesus [name] MRN: [id number] Date of Birth: [birth year]  Subjective/Objective:      CM following for progression and d/c planning.               Action/Plan: 12/31/2016 Noted CM consult for HH needs, however per hospial record this pt is a resident of SNF, [facility] and plan is to return to SNF. Will follow for any changes in this plan and assist as needed.  Addendum 01/08/17: Patient continues with hydrotherapy, poor nutritional status, poor wound healing. Palliative consult placed for goals of care with family.  Addendum 01/20/17 Wound bleeding; VAC DC'd. Hydrotherapy M-Sat. Fever 2/11 restarted IV Abx. Letter for appeal will need to signed by attending when ready for LTAC. Please notify Gerarda FractionLowry Lester [phone] for letter.  Expected Discharge Date:   (unsure)               Expected Discharge Plan:  Skilled Nursing Facility [facility])  In-House Referral:  Clinical Social Work  Discharge planning Services  CM Consult  Post Acute Care Choice:    Choice offered to:  Patient  DME Arranged:    DME Agency:     HH Arranged:    HH Agency:     Status of Service:  In process, will continue to follow  If discussed at Long Length of Stay Meetings, dates discussed:    Additional Comments:  Starlyn SkeansRoyal, Cheryl U, RN 12/31/2016, 1:51 PM

## 2017-01-01 DIAGNOSIS — E870 Hyperosmolality and hypernatremia: Secondary | ICD-10-CM

## 2017-01-01 LAB — COMPREHENSIVE METABOLIC PANEL
ALT: 20 U/L (ref 17–63)
AST: 23 U/L (ref 15–41)
Albumin: 1.9 g/dL — ABNORMAL LOW (ref 3.5–5.0)
Alkaline Phosphatase: 171 U/L — ABNORMAL HIGH (ref 38–126)
Anion gap: 9 (ref 5–15)
BILIRUBIN TOTAL: 1.5 mg/dL — AB (ref 0.3–1.2)
BUN: 39 mg/dL — AB (ref 6–20)
CHLORIDE: 115 mmol/L — AB (ref 101–111)
CO2: 30 mmol/L (ref 22–32)
CREATININE: 1.11 mg/dL (ref 0.61–1.24)
Calcium: 9.9 mg/dL (ref 8.9–10.3)
GFR calc Af Amer: >60 mL/min (ref >60)
Glucose, Bld: 115 mg/dL — ABNORMAL HIGH (ref 65–99)
Potassium: 3.6 mmol/L (ref 3.5–5.1)
Sodium: 154 mmol/L — ABNORMAL HIGH (ref 135–145)
Total Protein: 6.3 g/dL — ABNORMAL LOW (ref 6.5–8.1)

## 2017-01-01 LAB — CBC
HEMATOCRIT: 37.3 % — AB (ref 39.0–52.0)
Hemoglobin: 11.5 g/dL — ABNORMAL LOW (ref 13.0–17.0)
MCH: 27.3 pg (ref 26.0–34.0)
MCHC: 30.8 g/dL (ref 30.0–36.0)
MCV: 88.6 fL (ref 78.0–100.0)
PLATELETS: 287 10*3/uL (ref 150–400)
RBC: 4.21 MIL/uL — AB (ref 4.22–5.81)
RDW: 16.9 % — ABNORMAL HIGH (ref 11.5–15.5)
WBC: 10.4 10*3/uL (ref 4.0–10.5)

## 2017-01-01 MED ORDER — ENSURE ENLIVE PO LIQD
237.0000 mL | Freq: Two times a day (BID) | ORAL | Status: DC
  Administered 2017-01-01 – 2017-01-08 (×9): 237 mL via ORAL

## 2017-01-01 MED ORDER — LORAZEPAM 2 MG/ML IJ SOLN
1.0000 mg | Freq: Once | INTRAMUSCULAR | Status: AC
  Administered 2017-01-01: 1 mg via INTRAVENOUS
  Filled 2017-01-01: qty 1

## 2017-01-01 MED ORDER — VANCOMYCIN HCL IN DEXTROSE 1-5 GM/200ML-% IV SOLN
1000.0000 mg | Freq: Two times a day (BID) | INTRAVENOUS | Status: DC
  Administered 2017-01-01 – 2017-01-04 (×7): 1000 mg via INTRAVENOUS
  Filled 2017-01-01 (×9): qty 200

## 2017-01-01 MED ORDER — PRO-STAT SUGAR FREE PO LIQD
30.0000 mL | Freq: Every day | ORAL | Status: DC
  Administered 2017-01-01 – 2017-01-08 (×5): 30 mL via ORAL
  Filled 2017-01-01 (×6): qty 30

## 2017-01-01 MED ORDER — SACCHAROMYCES BOULARDII 250 MG PO CAPS
250.0000 mg | ORAL_CAPSULE | Freq: Two times a day (BID) | ORAL | Status: DC
  Administered 2017-01-01 – 2017-01-23 (×39): 250 mg via ORAL
  Filled 2017-01-01 (×42): qty 1

## 2017-01-01 NOTE — Progress Notes (Signed)
Nutrition Follow-up  DOCUMENTATION CODES:   Morbid obesity  INTERVENTION:  Provide Ensure Enlive po BID, each supplement provides 350 kcal and 20 grams of protein.  Provide 30 ml Prostat po once daily, each supplement provides 100 kcal and 15 grams of protein.  Encourage adequate PO intake.   NUTRITION DIAGNOSIS:   Increased nutrient needs related to wound healing as evidenced by estimated needs; ongoing  GOAL:   Patient will meet greater than or equal to 90% of their needs; not met  MONITOR:   Diet advancement, Labs, Weight trends, Skin, I & O's  REASON FOR ASSESSMENT:   Low Braden    ASSESSMENT:   70 y.o. male with medical history significant of A.Fib, HLD, HTN, sacral decubitus ulcer. Patient present to [facility] via EMS from [facility] with c/o confusion and elevated WBC today on routine labs. Pt is confused at baseline according to the nursing staff. He does say his bottom hurts and he thinks he has had a wound for 4-5 months. PMH CKD3, CHF. Pt also presents with AKI on CKD3.  Pt has been advanced to a heart diet. RD to order nutritional supplements to aid in wound healing. Labs and medications reviewed. Sodium elevated at 154. Chloride elevated at 115. Per MD note, will stop normal saline and allow pt to hydrate orally and to repeat labs tomorrow.   Diet Order:  Diet NPO time specified Diet Heart Room service appropriate? Yes; Fluid consistency: Thin  Skin:  Wound (see comment) (Unstageable to sacrum, stg 2 R heel)  Last BM:  Unknown  Height:   Ht Readings from Last 1 Encounters:  12/29/16 5' 10" (1.778 m)    Weight:   Wt Readings from Last 1 Encounters:  12/29/16 300 lb (136.1 kg)    Ideal Body Weight:  75.45 kg  BMI:  Body mass index is 43.05 kg/m.  Estimated Nutritional Needs:   Kcal:  2000-2300  Protein:  120-140 grams  Fluid:  2-2.3 L/day  EDUCATION NEEDS:   No education needs identified at this time   , MS,  RD, LDN Pager # [phone] After hours/ weekend pager # [phone]  

## 2017-01-01 NOTE — Evaluation (Signed)
Clinical/Bedside Swallow Evaluation Patient Details  Name: Jesus [name] MRN: [id number] Date of Birth: 04-08-1946  Today's Date: 01/01/2017 Time: SLP Start Time (ACUTE ONLY): 1332 SLP Stop Time (ACUTE ONLY): 1351 SLP Time Calculation (min) (ACUTE ONLY): 19 min  Past Medical History:  Past Medical History:  Diagnosis Date  . Acute on chronic renal failure (HCC)   . Arthritis    "normal for my age" (11/13/2016)  . Cellulitis and abscess of leg 11/2016  . Chronic anemia   . Chronic atrial fibrillation (HCC)   . Chronic diastolic CHF (congestive heart failure) (HCC) 12/2002   a. EF 50-55% by echo 04/2012  . Decubitus ulcer of buttock, unstageable (HCC) 12/31/2016  . Edema of lower extremity   . Gout   . H/O: GI bleed    "cause I took too much aspirin"  . High cholesterol   . Hyperglycemia    Noted 05/2012  . Hypertension   . Morbid obesity (HCC)   . PVD (peripheral vascular disease) (HCC)   . Venous stasis ulcer (HCC)    Past Surgical History:  Past Surgical History:  Procedure Laterality Date  . NO PAST SURGERIES     HPI:  71 y.o. male with medical history significant of A.Fib, HLD, HTN, sacral decubitus ulcer.  Patient presented to the ED on 12/30/16 [facility] with c/o confusion and elevated WBC today on routine labs.  It was unclear how long patient has been confused for and patient wasn't able to recall specific details regarding how long his symptoms have been ongoing.  He did describe severe pain in buttock area and pain "everywhere" when he tries to move his legs (the latter presumably from the known contractures he has); CXR on 12/29/16 negative   Assessment / Plan / Recommendation Clinical Impression   Pt exhibited oral residue from lunch tray which had recently been given prior to BSE; pt refused oral care, but no overt s/s of aspiration noted with liquids via cup/straw-->solids during BSE; pt oriented to self only during assessment; pt was  provided awareness of oral residue and this was cleared from oral cavity via liquids/lingual sweep after BSE; recommend Regular/thin diet with swallowing precautions/aspiration precautions in place; mild aspiration risk d/t cognitive status; ST will f/u x1 for diet tolerance/education re: use of swallowing precautions.    Aspiration Risk  Mild aspiration risk    Diet Recommendation   Regular/thin  Medication Administration: Whole meds with puree    Other  Recommendations Oral Care Recommendations: Oral care BID   Follow up Recommendations None      Frequency and Duration min 1 x/week  1 week       Prognosis Prognosis for Safe Diet Advancement: Good Barriers to Reach Goals: Cognitive deficits      Swallow Study   General Date of Onset: 12/30/16 HPI: 71 y.o. male with medical history significant of A.Fib, HLD, HTN, sacral decubitus ulcer.  [facility] with c/o confusion and elevated WBC today on routine labs.  It is unclear how long patient has been confused for and patient isnt able to recall specific details regarding how long his symptoms have been ongoing.  Does describe severe pain in buttock area and pain "everywhere" when he tries to move his legs (the latter presumably from the known contractures he has). Type of Study: Bedside Swallow Evaluation Diet Prior to this Study: Regular;Thin liquids Temperature Spikes Noted: No Respiratory  Status: Room air History of Recent Intubation: No Behavior/Cognition: Alert;Cooperative;Confused;Distractible Oral Care Completed by SLP: No Oral Cavity - Dentition: Adequate natural dentition Self-Feeding Abilities: Needs assist Patient Positioning: Upright in bed Baseline Vocal Quality: Normal Volitional Cough: Strong Volitional Swallow: Able to elicit    Oral/Motor/Sensory Function Overall Oral Motor/Sensory Function: Other (comment) (Per limited OME, appear WFL)   Ice Chips Ice chips:  Not tested   Thin Liquid Thin Liquid: Within functional limits Presentation: Cup;Straw    Nectar Thick Nectar Thick Liquid: Not tested   Honey Thick Honey Thick Liquid: Not tested   Puree Puree: Within functional limits   Solid      Solid: Impaired Presentation: Spoon Oral Phase Functional Implications: Oral residue;Other (comment) (cleared with liquid wash/increased awareness)    Functional Assessment Tool Used: NOMS Functional Limitations: Swallowing Swallow Current Status (E4540): At least 1 percent but less than 20 percent impaired, limited or restricted Swallow Goal Status [id number]): At least 1 percent but less than 20 percent impaired, limited or restricted   Terriana Barreras,PAT, M.S., CCC-SLP 01/01/2017,2:10 PM

## 2017-01-01 NOTE — Progress Notes (Signed)
Pharmacy Antibiotic Note  Jesus [name] is a 71 y.o. male admitted on 12/29/2016 with wound infection.  Pharmacy has been consulted for Vancomycin & Zosyn dosing.   The patient's renal function has improved thus warranting a dose adjustment. SCr 1.11, CrCl~60-65 ml/min (normalized). Noted plans for I&D on 1/26.   Plan: 1. Adjust Vancomycin to 1g IV every 12 hours 2. Continue Zosyn 3.375g IV every 8 hours (infused over 4 hours) 3. Will continue to follow renal function, culture results, LOT, and antibiotic de-escalation plans   Height: 5\' 10"  (177.8 cm) Weight: 300 lb (136.1 kg) IBW/kg (Calculated) : 73  Temp (24hrs), Avg:98.7 F (37.1 C), Min:98.1 F (36.7 C), Max:99.4 F (37.4 C)   Recent Labs Lab 12/29/16 1916 12/29/16 1928 12/30/16 0410 12/30/16 0618 01/01/17 0518  WBC 13.8*  --   --   --  10.4  CREATININE 1.63*  --   --   --  1.11  LATICACIDVEN  --  1.65 1.1 1.0  --     Estimated Creatinine Clearance: 86 mL/min (by C-G formula based on SCr of 1.11 mg/dL).    Allergies  Allergen Reactions  . Amlodipine Besylate Swelling and Other (See Comments)    "started off low and ended up severe; if, in fact, that is what's causing the swelling" (06/03/12)  . Levaquin [Levofloxacin In D5w] Rash   Vanc 1/22>> Zosyn 1/23>>  Dose adjustments this admission:  N/a  Microbiology results:  1/22 BCx: ngtd  Thank you for allowing pharmacy to be a part of this patient's care.  Georgina PillionElizabeth Aadith Raudenbush, PharmD, BCPS Clinical Pharmacist Pager: [phone] Clinical phone for 01/01/2017 from 7a-3:30p: [phone] If after 3:30p, please call main pharmacy at: [phone] 01/01/2017 1:14 PM

## 2017-01-01 NOTE — Progress Notes (Signed)
[facility] PROGRESS NOTE  Lincoln Ginley Paino ZOX:[id number] DOB: December 25, 1945 DOA: 12/29/2016  PCP: Miki Kins  Brief History/Interval Summary: Lafayette Dragon Prevetteis a 71 y.o.malewith medical history significant for A.Fib, HLD, HTN, sacral decubitus ulcer. Patient presented to the ED via EMS from [facility] with c/o confusion and elevated WBC on routine labs. Concern was for infection in the sacral decubitus. Patient was hospitalized for management.  Reason for Visit: Infected sacral decubitus  Consultants: Gen. surgery  Procedures: None as yet  Antibiotics: Vancomycin and Zosyn  Subjective/Interval History: Patient more engaging today but remains confused. Unable to provide much information.  ROS: Unable to do due to confusion  Objective:  Vital Signs  Vitals:   12/31/16 0903 12/31/16 1725 12/31/16 2115 01/01/17 0416  BP: (!) 91/48 133/66 125/63 130/63  Pulse: (!) 109 80 74 71  Resp: (!) 21 20 18 19   Temp: 98.5 F (36.9 C) 99.4 F (37.4 C) 99 F (37.2 C) 98.1 F (36.7 C)  TempSrc: Oral Oral Oral Oral  SpO2: 92% 96% 98% 99%  Weight:      Height:        Intake/Output Summary (Last 24 hours) at 01/01/17 0920 Last data filed at 01/01/17 8119  Gross per 24 hour  Intake          2789.17 ml  Output             1400 ml  Net          1389.17 ml   Filed Weights   12/29/16 1827  Weight: 136.1 kg (300 lb)    General appearance: alert, distracted and no distress Resp: clear to auscultation bilaterally Cardio: regular rate and rhythm, S1, S2 normal, no murmur, click, rub or gallop GI: soft, non-tender; bowel sounds normal; no masses,  no organomegaly Unstageable pressure injury to lower back Neurologic: Remains confused. Moving all his extremities. No obvious neurological is noted.  Lab Results:  Data Reviewed: I have personally reviewed following labs and imaging studies  CBC:  Recent Labs Lab 12/29/16 1916 01/01/17 0518  WBC  13.8* 10.4  NEUTROABS 11.6*  --   HGB 11.7* 11.5*  HCT 35.8* 37.3*  MCV 85.6 88.6  PLT 332 287    Basic Metabolic Panel:  Recent Labs Lab 12/29/16 1916 01/01/17 0518  NA 139 154*  K 3.8 3.6  CL 95* 115*  CO2 29 30  GLUCOSE 141* 115*  BUN 86* 39*  CREATININE 1.63* 1.11  CALCIUM 9.7 9.9    GFR: Estimated Creatinine Clearance: 86 mL/min (by C-G formula based on SCr of 1.11 mg/dL).  Liver Function Tests:  Recent Labs Lab 12/29/16 1916 01/01/17 0518  AST 31 23  ALT 23 20  ALKPHOS 200* 171*  BILITOT 1.5* 1.5*  PROT 6.4* 6.3*  ALBUMIN 2.3* 1.9*     Recent Labs Lab 12/29/16 1916  LIPASE 27   Cardiac Enzymes:  Recent Labs Lab 12/30/16 0618  CKTOTAL 29*     Recent Results (from the past 240 hour(s))  Culture, blood (routine x 2)     Status: None (Preliminary result)   Collection Time: 12/29/16  7:16 PM  Result Value Ref Range Status   Specimen Description BLOOD RIGHT ANTECUBITAL  Final   Special Requests BOTTLES DRAWN AEROBIC AND ANAEROBIC 5CC  Final   Culture NO GROWTH 2 DAYS  Final   Report Status PENDING  Incomplete  Culture, blood (routine x 2)     Status: None (Preliminary result)  Collection Time: 12/29/16  8:59 PM  Result Value Ref Range Status   Specimen Description BLOOD RIGHT HAND  Final   Special Requests IN PEDIATRIC BOTTLE 4CC  Final   Culture NO GROWTH 2 DAYS  Final   Report Status PENDING  Incomplete      Radiology Studies: No results found.   Medications:  Scheduled: . atorvastatin  80 mg Oral q1800  . carvedilol  6.25 mg Oral BID WC  . divalproex  500 mg Oral QHS  . pantoprazole  40 mg Oral BID  . piperacillin-tazobactam (ZOSYN)  IV  3.375 g Intravenous Q8H  . QUEtiapine  25 mg Oral BID  . saccharomyces boulardii  250 mg Oral BID  . sodium hypochlorite   Irrigation BID  . vancomycin  1,500 mg Intravenous Q24H   Continuous:  WUJ:[id number]:acetaminophen, HYDROcodone-acetaminophen, hydrOXYzine  Assessment/Plan:  Principal  Problem:   Decubitus ulcer of sacral region, unstageable (HCC) Active Problems:   Atrial fibrillation (HCC)   Chronic venous insufficiency   Chronic diastolic CHF (congestive heart failure) (HCC)   Acute on chronic renal failure (HCC)   Delirium    Infected Decubitus ulcer of sacral region Large sacral decubitus ulcer, unstageable. Concerns for infection. CT scan did show some air pockets without any evidence for abscess. Wound care was consulted. Subsequently, general surgery was consulted. They plan to do debridement once patient is off of anticoagulation. Continue vancomycin and Zosyn for now.  Acute Delirium Presumably secondary to above. [facility] Patient has been there for about 4-5 weeks and has exhibited strange behavior. He has been confused. Sometimes verbally abusive. Sometimes would do bizarre stuff such as pouring water into the medication cup. He is exhibiting similar behavior here. So it appears that this is an issue ever since he has been at the nursing facility. Patient could have underlying cognitive impairment. Continue to monitor from home.   A.Fib  CHA2DS2-VASc score is 4. Rate is controlled with Coreg. Patient is on Eliquis. Plan is to hold it for now for surgery. No known history of stroke. No clear indication to use heparin infusion.  Likely mild AKI on CKD stage 3/hypernatremia Creatinine baseline 1.4, presented with creatinine 1.6 very close to baseline. Diuretics held, given IV fluids. Creatinine has improved. However, now patient is hypernatremic. We'll stop his normal saline. Allow him to hydrate himself orally. Repeat labs tomorrow morning.   Chronic diastolic CHF  Holding lasix and spironolactone for AKI. Seems euvolemic.  Hepatic and pancreatic lesions Cystic lesions incidentally noted on CT scan. Patient will need further evaluation of this with MRI. Will plan for this prior to discharge if renal function  improves.  Chronic venous insufficiency  Wound care consulted.  DVT Prophylaxis: Eliquis on hold. SCDs.    Code Status: Full code  Family Communication: No family at bedside  Disposition Plan: Await surgical debridement. Continue antibiotics as mentioned above. Activity status is unknown. PT evaluation once more stable.    LOS: 2 days   [facility] LLCKRISHNAN,Toy Eisemann  [facility] Pager [phone] 01/01/2017, 9:20 AM  If 7PM-7AM, please contact night-coverage at [email or web address], password [email or web address]

## 2017-01-02 ENCOUNTER — Inpatient Hospital Stay (HOSPITAL_COMMUNITY): Payer: Medicare HMO | Admitting: Certified Registered"

## 2017-01-02 ENCOUNTER — Encounter (HOSPITAL_COMMUNITY): Payer: Self-pay | Admitting: Certified Registered"

## 2017-01-02 ENCOUNTER — Encounter (HOSPITAL_COMMUNITY)
Admission: EM | Disposition: A | Discharge status: Other Acute Inpatient Hospital | Payer: Self-pay | Source: Home / Self Care | Attending: Internal Medicine

## 2017-01-02 HISTORY — PX: DEBRIDMENT OF DECUBITUS ULCER: SHX6276

## 2017-01-02 LAB — BASIC METABOLIC PANEL
ANION GAP: 10 (ref 5–15)
BUN: 32 mg/dL — AB (ref 6–20)
CHLORIDE: 111 mmol/L (ref 101–111)
CO2: 30 mmol/L (ref 22–32)
Calcium: 10.1 mg/dL (ref 8.9–10.3)
Creatinine, Ser: 1.16 mg/dL (ref 0.61–1.24)
Glucose, Bld: 118 mg/dL — ABNORMAL HIGH (ref 65–99)
POTASSIUM: 4 mmol/L (ref 3.5–5.1)
SODIUM: 151 mmol/L — AB (ref 135–145)

## 2017-01-02 LAB — RETICULOCYTES
RBC.: 4.45 MIL/uL (ref 4.22–5.81)
Retic Count, Absolute: 35.6 10*3/uL (ref 19.0–186.0)
Retic Ct Pct: 0.8 % (ref 0.4–3.1)

## 2017-01-02 LAB — SURGICAL PCR SCREEN
MRSA, PCR: POSITIVE — AB
STAPHYLOCOCCUS AUREUS: POSITIVE — AB

## 2017-01-02 LAB — IRON AND TIBC
IRON: 43 ug/dL — AB (ref 45–182)
Saturation Ratios: 23 % (ref 17.9–39.5)
TIBC: 189 ug/dL — AB (ref 250–450)
UIBC: 146 ug/dL

## 2017-01-02 LAB — CBC
HCT: 39.9 % (ref 39.0–52.0)
HEMOGLOBIN: 12 g/dL — AB (ref 13.0–17.0)
MCH: 27 pg (ref 26.0–34.0)
MCHC: 30.1 g/dL (ref 30.0–36.0)
MCV: 89.7 fL (ref 78.0–100.0)
Platelets: 287 10*3/uL (ref 150–400)
RBC: 4.45 MIL/uL (ref 4.22–5.81)
RDW: 16.8 % — ABNORMAL HIGH (ref 11.5–15.5)
WBC: 13.2 10*3/uL — AB (ref 4.0–10.5)

## 2017-01-02 LAB — FERRITIN: Ferritin: 756 ng/mL — ABNORMAL HIGH (ref 24–336)

## 2017-01-02 LAB — VITAMIN B12: VITAMIN B 12: 699 pg/mL (ref 180–914)

## 2017-01-02 LAB — FOLATE: FOLATE: 18.6 ng/mL (ref >5.9)

## 2017-01-02 SURGERY — DEBRIDMENT OF DECUBITUS ULCER
Anesthesia: General | Site: Buttocks

## 2017-01-02 MED ORDER — SUCCINYLCHOLINE CHLORIDE 200 MG/10ML IV SOSY
PREFILLED_SYRINGE | INTRAVENOUS | Status: DC | PRN
  Administered 2017-01-02: 100 mg via INTRAVENOUS

## 2017-01-02 MED ORDER — PHENYLEPHRINE 40 MCG/ML (10ML) SYRINGE FOR IV PUSH (FOR BLOOD PRESSURE SUPPORT)
PREFILLED_SYRINGE | INTRAVENOUS | Status: DC | PRN
  Administered 2017-01-02: 160 ug via INTRAVENOUS
  Administered 2017-01-02: 120 ug via INTRAVENOUS

## 2017-01-02 MED ORDER — LIDOCAINE 2% (20 MG/ML) 5 ML SYRINGE
INTRAMUSCULAR | Status: DC | PRN
  Administered 2017-01-02: 60 mg via INTRAVENOUS

## 2017-01-02 MED ORDER — ROCURONIUM BROMIDE 10 MG/ML (PF) SYRINGE
PREFILLED_SYRINGE | INTRAVENOUS | Status: DC | PRN
  Administered 2017-01-02: 30 mg via INTRAVENOUS

## 2017-01-02 MED ORDER — THIAMINE HCL 100 MG/ML IJ SOLN
100.0000 mg | Freq: Every day | INTRAMUSCULAR | Status: DC
  Administered 2017-01-02: 100 mg via INTRAVENOUS
  Filled 2017-01-02: qty 2

## 2017-01-02 MED ORDER — PROPOFOL 10 MG/ML IV BOLUS
INTRAVENOUS | Status: AC
Start: 2017-01-02 — End: 2017-01-02
  Filled 2017-01-02: qty 20

## 2017-01-02 MED ORDER — LIDOCAINE 2% (20 MG/ML) 5 ML SYRINGE
INTRAMUSCULAR | Status: AC
  Filled 2017-01-02: qty 5

## 2017-01-02 MED ORDER — DAKINS (1/4 STRENGTH) 0.125 % EX SOLN
CUTANEOUS | Status: AC
  Administered 2017-01-02: 1
  Filled 2017-01-02: qty 473

## 2017-01-02 MED ORDER — ONDANSETRON HCL 4 MG/2ML IJ SOLN: 4.0000 mg | Freq: Four times a day (QID) | INTRAMUSCULAR | Status: DC | PRN

## 2017-01-02 MED ORDER — OXYCODONE HCL 5 MG PO TABS: 5.0000 mg | ORAL_TABLET | Freq: Once | ORAL | Status: DC | PRN

## 2017-01-02 MED ORDER — DEXTROSE 5 % IV SOLN
INTRAVENOUS | Status: DC | PRN
  Administered 2017-01-02: 100 ug/min via INTRAVENOUS

## 2017-01-02 MED ORDER — SODIUM CHLORIDE 0.9 % IR SOLN
Status: DC | PRN
Start: 2017-01-02 — End: 2017-01-02
  Administered 2017-01-02: 3000 mL

## 2017-01-02 MED ORDER — SUGAMMADEX SODIUM 500 MG/5ML IV SOLN
INTRAVENOUS | Status: AC
  Filled 2017-01-02: qty 5

## 2017-01-02 MED ORDER — LACTATED RINGERS IV SOLN
INTRAVENOUS | Status: DC | PRN
  Administered 2017-01-02: 09:00:00 via INTRAVENOUS

## 2017-01-02 MED ORDER — 0.9 % SODIUM CHLORIDE (POUR BTL) OPTIME
TOPICAL | Status: DC | PRN
  Administered 2017-01-02: 1000 mL

## 2017-01-02 MED ORDER — OXYCODONE HCL 5 MG/5ML PO SOLN: 5.0000 mg | Freq: Once | ORAL | Status: DC | PRN

## 2017-01-02 MED ORDER — HYDROMORPHONE HCL 1 MG/ML IJ SOLN
1.0000 mg | INTRAMUSCULAR | Status: DC | PRN
  Administered 2017-01-03: 2 mg via INTRAVENOUS
  Administered 2017-01-03 – 2017-01-04 (×2): 1 mg via INTRAVENOUS
  Administered 2017-01-04 – 2017-01-07 (×9): 2 mg via INTRAVENOUS
  Administered 2017-01-07: 1 mg via INTRAVENOUS
  Administered 2017-01-07 (×3): 2 mg via INTRAVENOUS
  Administered 2017-01-08 (×2): 1 mg via INTRAVENOUS
  Administered 2017-01-09 – 2017-01-13 (×12): 2 mg via INTRAVENOUS
  Administered 2017-01-13: 1 mg via INTRAVENOUS
  Administered 2017-01-14 (×4): 2 mg via INTRAVENOUS
  Administered 2017-01-15: 1 mg via INTRAVENOUS
  Administered 2017-01-15: 2 mg via INTRAVENOUS
  Administered 2017-01-15 – 2017-01-17 (×7): 1 mg via INTRAVENOUS
  Administered 2017-01-17: 2 mg via INTRAVENOUS
  Administered 2017-01-17 – 2017-01-18 (×2): 1 mg via INTRAVENOUS
  Administered 2017-01-18: 2 mg via INTRAVENOUS
  Administered 2017-01-18 – 2017-01-20 (×5): 1 mg via INTRAVENOUS
  Administered 2017-01-21: 2 mg via INTRAVENOUS
  Administered 2017-01-21 (×5): 1 mg via INTRAVENOUS
  Administered 2017-01-22: 2 mg via INTRAVENOUS
  Administered 2017-01-23 (×2): 1 mg via INTRAVENOUS
  Filled 2017-01-02 (×2): qty 2
  Filled 2017-01-02: qty 1
  Filled 2017-01-02 (×2): qty 2
  Filled 2017-01-02: qty 1
  Filled 2017-01-02: qty 2
  Filled 2017-01-02 (×3): qty 1
  Filled 2017-01-02 (×2): qty 2
  Filled 2017-01-02 (×2): qty 1
  Filled 2017-01-02 (×2): qty 2
  Filled 2017-01-02 (×2): qty 1
  Filled 2017-01-02: qty 2
  Filled 2017-01-02 (×5): qty 1
  Filled 2017-01-02: qty 2
  Filled 2017-01-02: qty 1
  Filled 2017-01-02 (×4): qty 2
  Filled 2017-01-02: qty 1
  Filled 2017-01-02 (×2): qty 2
  Filled 2017-01-02 (×2): qty 1
  Filled 2017-01-02 (×5): qty 2
  Filled 2017-01-02: qty 1
  Filled 2017-01-02: qty 2
  Filled 2017-01-02: qty 1
  Filled 2017-01-02 (×2): qty 2
  Filled 2017-01-02 (×2): qty 1
  Filled 2017-01-02 (×4): qty 2
  Filled 2017-01-02 (×2): qty 1
  Filled 2017-01-02 (×3): qty 2
  Filled 2017-01-02: qty 1
  Filled 2017-01-02: qty 2
  Filled 2017-01-02: qty 1
  Filled 2017-01-02 (×6): qty 2
  Filled 2017-01-02 (×2): qty 1
  Filled 2017-01-02: qty 2

## 2017-01-02 MED ORDER — FENTANYL CITRATE (PF) 100 MCG/2ML IJ SOLN: 25.0000 ug | INTRAMUSCULAR | Status: DC | PRN

## 2017-01-02 MED ORDER — ONDANSETRON HCL 4 MG/2ML IJ SOLN
INTRAMUSCULAR | Status: AC
  Filled 2017-01-02: qty 2

## 2017-01-02 MED ORDER — FENTANYL CITRATE (PF) 100 MCG/2ML IJ SOLN
INTRAMUSCULAR | Status: AC
  Filled 2017-01-02: qty 2

## 2017-01-02 MED ORDER — EPHEDRINE SULFATE-NACL 50-0.9 MG/10ML-% IV SOSY
PREFILLED_SYRINGE | INTRAVENOUS | Status: DC | PRN
  Administered 2017-01-02: 15 mg via INTRAVENOUS

## 2017-01-02 MED ORDER — ONDANSETRON HCL 4 MG/2ML IJ SOLN
INTRAMUSCULAR | Status: DC | PRN
  Administered 2017-01-02: 4 mg via INTRAVENOUS

## 2017-01-02 MED ORDER — FENTANYL CITRATE (PF) 100 MCG/2ML IJ SOLN
INTRAMUSCULAR | Status: DC | PRN
  Administered 2017-01-02: 100 ug via INTRAVENOUS

## 2017-01-02 MED ORDER — ROCURONIUM BROMIDE 50 MG/5ML IV SOSY
PREFILLED_SYRINGE | INTRAVENOUS | Status: AC
  Filled 2017-01-02: qty 5

## 2017-01-02 MED ORDER — PROPOFOL 10 MG/ML IV BOLUS
INTRAVENOUS | Status: DC | PRN
  Administered 2017-01-02: 120 mg via INTRAVENOUS

## 2017-01-02 MED ORDER — SUGAMMADEX SODIUM 200 MG/2ML IV SOLN
INTRAVENOUS | Status: DC | PRN
  Administered 2017-01-02: 300 mg via INTRAVENOUS

## 2017-01-02 SURGICAL SUPPLY — 47 items
APL SKNCLS STERI-STRIP NONHPOA (GAUZE/BANDAGES/DRESSINGS)
BENZOIN TINCTURE PRP APPL 2/3 (GAUZE/BANDAGES/DRESSINGS) IMPLANT
BLADE SURG ROTATE 9660 (MISCELLANEOUS) IMPLANT
BNDG GAUZE ELAST 4 BULKY (GAUZE/BANDAGES/DRESSINGS) ×6 IMPLANT
CANISTER SUCTION 2500CC (MISCELLANEOUS) IMPLANT
CHLORAPREP W/TINT 26ML (MISCELLANEOUS) ×3 IMPLANT
CLEANER TIP ELECTROSURG 2X2 (MISCELLANEOUS) ×3 IMPLANT
COVER SURGICAL LIGHT HANDLE (MISCELLANEOUS) ×3 IMPLANT
DECANTER SPIKE VIAL GLASS SM (MISCELLANEOUS) IMPLANT
DRAPE LAPAROTOMY T 102X78X121 (DRAPES) ×3 IMPLANT
DRAPE UTILITY XL STRL (DRAPES) ×6 IMPLANT
DRSG PAD ABDOMINAL 8X10 ST (GAUZE/BANDAGES/DRESSINGS) ×4 IMPLANT
ELECT REM PT RETURN 9FT ADLT (ELECTROSURGICAL) ×3
ELECTRODE REM PT RTRN 9FT ADLT (ELECTROSURGICAL) ×1 IMPLANT
GAUZE SPONGE 4X4 12PLY STRL (GAUZE/BANDAGES/DRESSINGS) ×3 IMPLANT
GLOVE BIO SURGEON STRL SZ7.5 (GLOVE) ×3 IMPLANT
GLOVE BIOGEL PI IND STRL 8 (GLOVE) ×1 IMPLANT
GLOVE BIOGEL PI INDICATOR 8 (GLOVE) ×2
GOWN STRL REUS W/ TWL XL LVL3 (GOWN DISPOSABLE) ×2 IMPLANT
GOWN STRL REUS W/TWL XL LVL3 (GOWN DISPOSABLE) ×6
HANDPIECE INTERPULSE COAX TIP (DISPOSABLE) ×3
KIT BASIN OR (CUSTOM PROCEDURE TRAY) ×3 IMPLANT
KIT ROOM TURNOVER OR (KITS) ×3 IMPLANT
NEEDLE 22X1 1/2 (OR ONLY) (NEEDLE) IMPLANT
NS IRRIG 1000ML POUR BTL (IV SOLUTION) ×3 IMPLANT
PACK SURGICAL SETUP 50X90 (CUSTOM PROCEDURE TRAY) ×3 IMPLANT
PAD ARMBOARD 7.5X6 YLW CONV (MISCELLANEOUS) ×6 IMPLANT
PENCIL BUTTON HOLSTER BLD 10FT (ELECTRODE) ×3 IMPLANT
SET HNDPC FAN SPRY TIP SCT (DISPOSABLE) IMPLANT
SPECIMEN JAR SMALL (MISCELLANEOUS) ×3 IMPLANT
SPONGE LAP 18X18 X RAY DECT (DISPOSABLE) ×3 IMPLANT
SPONGE LAP 4X18 X RAY DECT (DISPOSABLE) ×3 IMPLANT
SUT ETHILON 2 0 FS 18 (SUTURE) IMPLANT
SUT VIC AB 2-0 CT1 27 (SUTURE)
SUT VIC AB 2-0 CT1 TAPERPNT 27 (SUTURE) IMPLANT
SUT VIC AB 3-0 SH 27 (SUTURE)
SUT VIC AB 3-0 SH 27X BRD (SUTURE) IMPLANT
SWAB COLLECTION DEVICE MRSA (MISCELLANEOUS) IMPLANT
SYR CONTROL 10ML LL (SYRINGE) IMPLANT
TOWEL OR 17X24 6PK STRL BLUE (TOWEL DISPOSABLE) ×3 IMPLANT
TOWEL OR 17X26 10 PK STRL BLUE (TOWEL DISPOSABLE) ×3 IMPLANT
TUBE ANAEROBIC SPECIMEN COL (MISCELLANEOUS) IMPLANT
TUBE CONNECTING 12'X1/4 (SUCTIONS)
TUBE CONNECTING 12X1/4 (SUCTIONS) IMPLANT
UNDERPAD 30X30 (UNDERPADS AND DIAPERS) ×3 IMPLANT
WATER STERILE IRR 1000ML POUR (IV SOLUTION) ×3 IMPLANT
YANKAUER SUCT BULB TIP NO VENT (SUCTIONS) IMPLANT

## 2017-01-02 NOTE — Anesthesia Postprocedure Evaluation (Signed)
Anesthesia Post Note  Patient: Jesus [name]  Procedure(s) Performed: Procedure(s) (LRB): DEBRIDMENT OF DECUBITUS ULCER (N/A)  Patient location during evaluation: PACU Anesthesia Type: General Level of consciousness: awake and alert and patient cooperative Pain management: pain level controlled Vital Signs Assessment: post-procedure vital signs reviewed and stable Respiratory status: spontaneous breathing and respiratory function stable Cardiovascular status: stable Anesthetic complications: no       Last Vitals:  Vitals:   01/02/17 1140 01/02/17 1150  BP: (!) 131/59 (!) 141/95  Pulse: 83 82  Resp: 19 20  Temp:  36.6 C    Last Pain:  Vitals:   01/02/17 1040  TempSrc:   PainSc: Asleep                 Amias Hutchinson S

## 2017-01-02 NOTE — Anesthesia Procedure Notes (Signed)
Procedure Name: Intubation Date/Time: 01/02/2017 9:13 AM Performed by: Myna Bright Pre-anesthesia Checklist: Patient identified, Emergency Drugs available, Suction available and Patient being monitored Patient Re-evaluated:Patient Re-evaluated prior to inductionOxygen Delivery Method: Circle system utilized Preoxygenation: Pre-oxygenation with 100% oxygen Intubation Type: IV induction Ventilation: Mask ventilation without difficulty and Oral airway inserted - appropriate to patient size Laryngoscope Size: Mac and 4 Grade View: Grade I Tube type: Oral Tube size: 7.5 mm Number of attempts: 1 Airway Equipment and Method: Stylet Placement Confirmation: ETT inserted through vocal cords under direct vision,  positive ETCO2 and breath sounds checked- equal and bilateral Secured at: 22 cm Tube secured with: Tape Dental Injury: Teeth and Oropharynx as per pre-operative assessment

## 2017-01-02 NOTE — Progress Notes (Signed)
[facility], CHG bath given, MRSA PCR done.  Transport on floor, pt has mittens and soft wrist  Restraints.  Daughter has signed consent in chart.

## 2017-01-02 NOTE — Progress Notes (Signed)
[facility] PROGRESS NOTE  Jesus [name] WUJ:[id number] DOB: March 20, 1946 DOA: 12/29/2016  PCP: Miki Kins  Brief History/Interval Summary: [place] Dragon Prevetteis a 71 y.o.malewith medical history significant for A.Fib, HLD, HTN, sacral decubitus ulcer. Patient presented to the ED via EMS from [facility] with c/o confusion and elevated WBC on routine labs. Concern was for infection in the sacral decubitus. Patient was hospitalized for management. Patient was placed on broad-spectrum antibiotics. Anticoagulation was held. Patient underwent surgical debridement on 1/26.  Reason for Visit: Infected sacral decubitus  Consultants: Gen. surgery  Procedures: Debridement of decubitus ulcer, 1/26  Antibiotics: Vancomycin and Zosyn  Subjective/Interval History: Patient seen after he returned from OR. Seems confused. Unable to provide much information.  ROS: Unable to do due to confusion  Objective:  Vital Signs  Vitals:   01/02/17 1110 01/02/17 1125 01/02/17 1140 01/02/17 1150  BP: (!) 113/47 118/77 (!) 131/59 (!) 141/95  Pulse: 74 80 83 82  Resp: 20 19 19 20   Temp:    97.9 F (36.6 C)  TempSrc:      SpO2: 100% 100% 99% 96%  Weight:      Height:        Intake/Output Summary (Last 24 hours) at 01/02/17 1409 Last data filed at 01/02/17 1023  Gross per 24 hour  Intake              850 ml  Output             1075 ml  Net             -225 ml   Filed Weights   12/29/16 1827 01/01/17 2027  Weight: 136.1 kg (300 lb) (!) 136.4 kg (300 lb 11.3 oz)    General appearance: alert, distracted and no distress Resp: clear to auscultation bilaterally Cardio: regular rate and rhythm, S1, S2 normal, no murmur, click, rub or gallop GI: soft, non-tender; bowel sounds normal; no masses,  no organomegaly Unstageable pressure injury to lower back Neurologic: Remains confused. Moving all his extremities. No obvious neurological Deficit is noted.  Lab  Results:  Data Reviewed: I have personally reviewed following labs and imaging studies  CBC:  Recent Labs Lab 12/29/16 1916 01/01/17 0518 01/02/17 0559  WBC 13.8* 10.4 13.2*  NEUTROABS 11.6*  --   --   HGB 11.7* 11.5* 12.0*  HCT 35.8* 37.3* 39.9  MCV 85.6 88.6 89.7  PLT 332 287 287    Basic Metabolic Panel:  Recent Labs Lab 12/29/16 1916 01/01/17 0518 01/02/17 0559  NA 139 154* 151*  K 3.8 3.6 4.0  CL 95* 115* 111  CO2 29 30 30   GLUCOSE 141* 115* 118*  BUN 86* 39* 32*  CREATININE 1.63* 1.11 1.16  CALCIUM 9.7 9.9 10.1    GFR: Estimated Creatinine Clearance: 82.5 mL/min (by C-G formula based on SCr of 1.16 mg/dL).  Liver Function Tests:  Recent Labs Lab 12/29/16 1916 01/01/17 0518  AST 31 23  ALT 23 20  ALKPHOS 200* 171*  BILITOT 1.5* 1.5*  PROT 6.4* 6.3*  ALBUMIN 2.3* 1.9*     Recent Labs Lab 12/29/16 1916  LIPASE 27   Cardiac Enzymes:  Recent Labs Lab 12/30/16 0618  CKTOTAL 29*     Recent Results (from the past 240 hour(s))  Culture, blood (routine x 2)     Status: None (Preliminary result)   Collection Time: 12/29/16  7:16 PM  Result Value Ref Range Status   Specimen Description BLOOD  RIGHT ANTECUBITAL  Final   Special Requests BOTTLES DRAWN AEROBIC AND ANAEROBIC 5CC  Final   Culture NO GROWTH 4 DAYS  Final   Report Status PENDING  Incomplete  Culture, blood (routine x 2)     Status: None (Preliminary result)   Collection Time: 12/29/16  8:59 PM  Result Value Ref Range Status   Specimen Description BLOOD RIGHT HAND  Final   Special Requests IN PEDIATRIC BOTTLE 4CC  Final   Culture NO GROWTH 4 DAYS  Final   Report Status PENDING  Incomplete  Surgical PCR screen     Status: Abnormal   Collection Time: 01/02/17  8:10 AM  Result Value Ref Range Status   MRSA, PCR POSITIVE (A) NEGATIVE Final    Comment: RESULT CALLED TO, READ BACK BY AND VERIFIED WITH: C. Peeler NT 11:05 01/02/17 ([email or web address])    Staphylococcus aureus POSITIVE (A)  NEGATIVE Final    Comment:        The Xpert SA Assay (FDA approved for NASAL specimens in patients over 71 years of age), is one component of a comprehensive surveillance program.  Test performance has been validated by [facility] for patients greater than or equal to 71 year old. It is not intended to diagnose infection nor to guide or monitor treatment.       Radiology Studies: No results found.   Medications:  Scheduled: . atorvastatin  80 mg Oral q1800  . carvedilol  6.25 mg Oral BID WC  . divalproex  500 mg Oral QHS  . feeding supplement (ENSURE ENLIVE)  237 mL Oral BID BM  . feeding supplement (PRO-STAT SUGAR FREE 64)  30 mL Oral Daily  . pantoprazole  40 mg Oral BID  . piperacillin-tazobactam (ZOSYN)  IV  3.375 g Intravenous Q8H  . QUEtiapine  25 mg Oral BID  . saccharomyces boulardii  250 mg Oral BID  . sodium hypochlorite   Irrigation BID  . vancomycin  1,000 mg Intravenous Q12H   Continuous:  ZOX:[id number]:acetaminophen, HYDROcodone-acetaminophen, HYDROmorphone (DILAUDID) injection, hydrOXYzine  Assessment/Plan:  Principal Problem:   Decubitus ulcer of sacral region, unstageable (HCC) Active Problems:   Atrial fibrillation (HCC)   Chronic venous insufficiency   Chronic diastolic CHF (congestive heart failure) (HCC)   Acute on chronic renal failure (HCC)   Delirium    Infected Decubitus ulcer of sacral region Large sacral decubitus ulcer, unstageable. Concerns for infection. CT scan did show some air pockets without any evidence for abscess. Wound care was consulted. Subsequently, general surgery was consulted. Patient's anticoagulation was held. Patient underwent surgical debridement today. Continue vancomycin and Zosyn for now.  Acute Delirium Presumably secondary to above. Spoke to the nursing staff [facility]. Patient has been there for about 4-5 weeks and has exhibited strange behavior. He has been confused. Sometimes verbally abusive. Sometimes  would do bizarre stuff such as pouring water into the medication cup. He is exhibiting similar behavior here. So it appears that this is an issue ever since he has been at the nursing facility. B-12 level was normal. TSH was normal in December. Check RPR. May need to consider MRI brain. Care everywhere notes were reviewed. It appears the patient was on Aricept at one point in time. This is mentioned on the medication list at the cardiology visit in August 2017. It is also noted that the patient has a history of alcohol intake. So it quite likely that he has cognitive impairment. We'll give him thiamine, considering history of alcohol use  in the past.  A.Fib  CHA2DS2-VASc score is 4. Rate is controlled with Coreg. Patient was on Eliquis, which has been held for surgery. Resume when okay with surgeon.  Likely mild AKI on CKD stage 3/hypernatremia Creatinine baseline 1.4, presented with creatinine 1.6 very close to baseline. Diuretics held, given IV fluids. Creatinine has improved. Patient was noted to be hypernatremic. IV fluids were stopped. Sodium level has improved.    Chronic diastolic CHF  Holding lasix and spironolactone for AKI. Seems euvolemic.  Hepatic and pancreatic lesions Cystic lesions incidentally noted on CT scan. Patient will need further evaluation of this with MRI. Will plan for this prior to discharge and as long as patient is able to lay still.  Chronic venous insufficiency  Wound care consulted.  DVT Prophylaxis: Eliquis on hold. SCDs.    Code Status: Full code  Family Communication: No family at bedside. Attempted to call the numbers listed in the chart for daughter and son without any response. Disposition Plan: Continue antibiotics as mentioned above. Initiate PT.    LOS: 3 days   [facility]  [facility] Pager [phone] 01/02/2017, 2:09 PM  If 7PM-7AM, please contact night-coverage at [email or web address], password [facility]

## 2017-01-02 NOTE — Transfer of Care (Signed)
Immediate Anesthesia Transfer of Care Note  Patient: Jesus [name]  Procedure(s) Performed: Procedure(s): DEBRIDMENT OF DECUBITUS ULCER (N/A)  Patient Location: PACU  Anesthesia Type:General  Level of Consciousness: responds to stimulation  Airway & Oxygen Therapy: Patient Spontanous Breathing and Patient connected to face mask oxygen  Post-op Assessment: Report given to RN, Post -op Vital signs reviewed and stable and Patient moving all extremities  Post vital signs: Reviewed and stable  Last Vitals:  Vitals:   01/02/17 0617 01/02/17 1040  BP: (!) 155/74   Pulse: 82   Resp: 17 (P) 20  Temp: 36.7 C (P) 36.3 C    Last Pain:  Vitals:   01/02/17 1040  TempSrc:   PainSc: (P) Asleep         Complications: No apparent anesthesia complications

## 2017-01-02 NOTE — Clinical Social Work Note (Signed)
[facility] from [facility] and CSW will continue to follow and assist with d/c back to facility when medically stable.  Genelle BalVanessa Random Dobrowski, MSW, LCSW Licensed Clinical Social Worker Clinical Social Work Department [facility] [phone]

## 2017-01-02 NOTE — Progress Notes (Signed)
SLP Cancellation Note  Patient Details Name: [name] M Devera MRN: [id number] DOB: 09-02-1946   Cancelled treatment:       Reason Eval/Treat Not Completed: Medical issues which prohibited therapy. Pt recently returned from surgery. Will continue attempts.   [place], Amy K, MA, CCC-SLP 01/02/2017, 12:48 PM [phone]

## 2017-01-02 NOTE — Progress Notes (Signed)
Pt off floor for procedure

## 2017-01-02 NOTE — Op Note (Signed)
01/02/2017  10:21 AM  PATIENT:  Jesus [name]  71 y.o. male  PRE-OPERATIVE DIAGNOSIS:  infected sacral ulcer  POST-OPERATIVE DIAGNOSIS:  infected sacral ulcer  PROCEDURE:  Procedure(s): DEBRIDMENT OF DECUBITUS ULCER 11 x 14 x 6cm (N/A)  SURGEON:  Surgeon(s) and Role:    * Axel FillerArmando Duvall Comes, MD - Primary  ANESTHESIA:   general  EBL: 25cc  Total I/O In: 500 [I.V.:500] Out: 0   BLOOD ADMINISTERED:none  DRAINS: Dakin's soaked kerlix x 1   LOCAL MEDICATIONS USED:  NONE  SPECIMEN:  No Specimen  DISPOSITION OF SPECIMEN:  N/A  COUNTS:  YES  TOURNIQUET:  * No tourniquets in log *  DICTATION: .Dragon Dictation Indications for procedure: The pt is a 71 y/o M who was admitted for a large necrotic sacral decubitus ulcer. Patient was unable quest. Patient had to wait 48 hours prior to proceeding with surgical debridement.  Details of procedure: After the patient was consented patient was taken back to the operating room and placed in the supine position with bilateral SCDs in place. Patient with general orotracheal anesthesia. Patient placed in the prone position. Patient was prepped and draped in standard fashion. Timeout was called all facts verified.  A 10 blade scalpel was used to remove the necrotic tissue from the sacral ulcer. Cautery was used to maintain hemostasis and deeper debridement of the wound was undertaken. The wound tracked down to the sacral bone. There was a large pocket to the right lateral portion of the gluteus. This was entirely debrided to healthy bleeding tissue. At this time a Pulsavac was used to irrigate the entire area. Any further areas of ischemic tissue were debrided at this time. The ultimate measurement of the debrided area measured 11 cm x 14 cm x 6 cm. The area was then packed with a quarter percent Dakin's soaked Kerlix. The area was dressed with ABDs pad and tape. The patient how the procedure well was taken to the recovery room in stable  condition.   PLAN OF CARE: Admit to inpatient   PATIENT DISPOSITION:  PACU - hemodynamically stable.   Delay start of Pharmacological VTE agent (>24hrs) due to surgical blood loss or risk of bleeding: yes

## 2017-01-02 NOTE — Anesthesia Preprocedure Evaluation (Signed)
Anesthesia Evaluation  Patient identified by MRN, date of birth, ID band Patient awake    Reviewed: Allergy & Precautions, H&P , NPO status , Patient's Chart, lab work & pertinent test results  Airway Mallampati: II   Neck ROM: full    Dental   Pulmonary former smoker,    breath sounds clear to auscultation       Cardiovascular hypertension, + Peripheral Vascular Disease and +CHF  + dysrhythmias Atrial Fibrillation  Rhythm:regular Rate:Normal     Neuro/Psych    GI/Hepatic   Endo/Other    Renal/GU      Musculoskeletal  (+) Arthritis ,   Abdominal   Peds  Hematology   Anesthesia Other Findings   Reproductive/Obstetrics                             Anesthesia Physical Anesthesia Plan  ASA: III  Anesthesia Plan: General   Post-op Pain Management:    Induction: Intravenous  Airway Management Planned: Oral ETT  Additional Equipment:   Intra-op Plan:   Post-operative Plan: Extubation in OR  Informed Consent: I have reviewed the patients History and Physical, chart, labs and discussed the procedure including the risks, benefits and alternatives for the proposed anesthesia with the patient or authorized representative who has indicated his/her understanding and acceptance.     Plan Discussed with: CRNA, Anesthesiologist and Surgeon  Anesthesia Plan Comments:         Anesthesia Quick Evaluation

## 2017-01-03 ENCOUNTER — Encounter (HOSPITAL_COMMUNITY): Payer: Self-pay | Admitting: General Surgery

## 2017-01-03 DIAGNOSIS — K862 Cyst of pancreas: Secondary | ICD-10-CM

## 2017-01-03 DIAGNOSIS — Z87891 Personal history of nicotine dependence: Secondary | ICD-10-CM

## 2017-01-03 DIAGNOSIS — Z79899 Other long term (current) drug therapy: Secondary | ICD-10-CM

## 2017-01-03 DIAGNOSIS — Z800 Family history of malignant neoplasm of digestive organs: Secondary | ICD-10-CM

## 2017-01-03 DIAGNOSIS — Z8249 Family history of ischemic heart disease and other diseases of the circulatory system: Secondary | ICD-10-CM

## 2017-01-03 DIAGNOSIS — Z888 Allergy status to other drugs, medicaments and biological substances status: Secondary | ICD-10-CM

## 2017-01-03 LAB — CBC
HCT: 35.5 % — ABNORMAL LOW (ref 39.0–52.0)
Hemoglobin: 10.7 g/dL — ABNORMAL LOW (ref 13.0–17.0)
MCH: 27.2 pg (ref 26.0–34.0)
MCHC: 30.1 g/dL (ref 30.0–36.0)
MCV: 90.1 fL (ref 78.0–100.0)
PLATELETS: 241 10*3/uL (ref 150–400)
RBC: 3.94 MIL/uL — ABNORMAL LOW (ref 4.22–5.81)
RDW: 17 % — AB (ref 11.5–15.5)
WBC: 11.9 10*3/uL — AB (ref 4.0–10.5)

## 2017-01-03 LAB — BASIC METABOLIC PANEL
ANION GAP: 8 (ref 5–15)
BUN: 28 mg/dL — AB (ref 6–20)
CALCIUM: 9.5 mg/dL (ref 8.9–10.3)
CO2: 29 mmol/L (ref 22–32)
Chloride: 112 mmol/L — ABNORMAL HIGH (ref 101–111)
Creatinine, Ser: 1.16 mg/dL (ref 0.61–1.24)
GFR calc Af Amer: >60 mL/min (ref >60)
GLUCOSE: 119 mg/dL — AB (ref 65–99)
Potassium: 3.8 mmol/L (ref 3.5–5.1)
Sodium: 149 mmol/L — ABNORMAL HIGH (ref 135–145)

## 2017-01-03 LAB — CULTURE, BLOOD (ROUTINE X 2)
Culture: NO GROWTH
Culture: NO GROWTH

## 2017-01-03 LAB — HIV ANTIBODY (ROUTINE TESTING W REFLEX): HIV Screen 4th Generation wRfx: NONREACTIVE

## 2017-01-03 LAB — RPR: RPR Ser Ql: NONREACTIVE

## 2017-01-03 MED ORDER — VITAMIN B-1 100 MG PO TABS
100.0000 mg | ORAL_TABLET | Freq: Every day | ORAL | Status: DC
  Administered 2017-01-04 – 2017-01-23 (×18): 100 mg via ORAL
  Filled 2017-01-03 (×19): qty 1

## 2017-01-03 NOTE — Plan of Care (Signed)
Problem: Safety: Goal: Ability to remain free from injury will improve Outcome: Progressing Called into patient room to assist MD with dressing change. Another RN was already assisting and requiring additional assistance d/t patient uncooperative with turning over d/t increased pain. Patient heard saying he would use a rifle to blow his brains out. MD to follow up. Will continue to monitor.   Problem: Skin Integrity: Goal: Risk for impaired skin integrity will decrease Outcome: Progressing Surgical debridement done yesterday. MD changed dressing this morning. Noted large amount serosanguinous drainage.

## 2017-01-03 NOTE — Consult Note (Signed)
[facility] Face-to-Face Psychiatry Consult   Reason for Consult: Depression Referring Physician:  Dr. [place] Pink Patient Identification: Jesus [name] MRN:  [id number] Principal Diagnosis: Decubitus ulcer of sacral region, unstageable [facility]) Diagnosis:   Patient Active Problem List   Diagnosis Date Noted  . Decubitus ulcer of sacral region, unstageable ([place]) [L89.150] 12/30/2016  . Delirium [R41.0] 12/30/2016  . Cellulitis of lower extremity [L03.119]   . Hyponatremia [E87.1] 07/15/2015  . Cellulitis of right lower extremity [L03.115] 07/15/2015  . Acute on chronic renal failure (HCC) [N17.9, N18.9]   . Hypokalemia [E87.6]   . Chronic atrial fibrillation ([place]) [I48.2] 07/09/2015  . Chronic anemia [D64.9] 07/09/2015  . CHF exacerbation ([place]) [I50.9] 07/08/2015  . Chronic diastolic CHF (congestive heart failure) ([place]) [I50.32] 04/02/2015  . Chronic diastolic heart failure ([place]) [I50.32] 01/25/2015  . Cellulitis [L03.90] 01/17/2015  . Pulmonary vascular congestion [R09.89]   . Congestive heart disease ([place]) [I50.9]   . Peripheral edema [R60.9]   . Acute exacerbation of CHF (congestive heart failure) ([place]) [I50.9] 01/04/2015  . Atherosclerosis of native arteries of the extremities with gangrene ([place]) [I70.269] 02/17/2014  . PAOD (peripheral arterial occlusive disease) ([place]) [I77.9] 02/17/2014  . Renal artery stenosis ([place]) [I70.1] 02/17/2014  . Peripheral vascular disease ([place]) [I73.9] 04/15/2013  . Chronic venous insufficiency [I87.2] 04/15/2013  . Morbid obesity ([place]) [E66.01] 06/11/2012  . Acute on chronic diastolic CHF (congestive heart failure) ([place]) [I50.33] 06/02/2012  . Unspecified essential hypertension [I10] 05/18/2012  . Atrial fibrillation ([place]) [I48.91] 04/27/2012  . CHF (congestive heart failure) ([place]) [I50.9] 04/27/2012  . Hypertension [I10] 04/27/2012    Total Time spent with patient: 30 minutes  Subjective:   Jesus [name] is a 71 y.o. male patient admitted with  confusion, elevated WBC.  HPI: 71 year old male, admitted from [place], due to worsening confusion and elevated WBC. Patient has chronic medical illnesses- decubitus ( sacral) ulcer, Atrial Fibrillation, CHF, CKD. Underwent ulcer debridement on 1/26.   As per chart notes, patient reported wanting to end his life , apparently during an episode of increased pain.  At this time patient presents alert, cooperative/pleasant on approach, confused. He is oriented to person, partially to place ( knows he is in [place], [place], but cannot name hospital), disoriented to time . He denies feeling depressed, denies sadness, and does not endorse anhedonia or any suicidal ideations . States his appetite and sleep have been " fine".  He does not remember having made any suicidal statements .His affect presents reactive, and smiles, even laughs at times during session.   I have reviewed case with RN staff, patient remains confused, delirious but partially improved,calmer, less agitated, and has not made any further suicidal statements . His statement of wanting to end his life was made during episode of increased pain.   Past Psychiatric History: patient denies any history of psychiatric illness, denies history of depression, denies history of suicide attempts or ideations , denies  drug abuse, states he used to drink heavily in the distant past, but not " in a long time".   Risk to Self: Is patient at risk for suicide?: No Risk to Others:   Prior Inpatient Therapy:   Prior Outpatient Therapy:    Past Medical History:  Past Medical History:  Diagnosis Date  . Acute on chronic renal failure ([place])   . Arthritis    "normal for my age" (11/13/2016)  . Cellulitis and abscess of leg 11/2016  . Chronic anemia   .  Chronic atrial fibrillation ([place])   . Chronic diastolic CHF (congestive heart failure) ([place]) 12/2002   a. EF 50-55% by echo 04/2012  . Decubitus ulcer of buttock, unstageable ([place])  12/31/2016  . Edema of lower extremity   . Gout   . H/O: GI bleed    "cause I took too much aspirin"  . High cholesterol   . Hyperglycemia    Noted 05/2012  . Hypertension   . Morbid obesity ([place])   . PVD (peripheral vascular disease) ([place])   . Venous stasis ulcer ([place])     Past Surgical History:  Procedure Laterality Date  . DEBRIDMENT OF DECUBITUS ULCER N/A 01/02/2017   Procedure: DEBRIDMENT OF DECUBITUS ULCER;  Surgeon: Ralene Ok, MD;  Location: [place];  Service: General;  Laterality: N/A;  . NO PAST SURGERIES     Family History:  Family History  Problem Relation Age of Onset  . Arrhythmia Father   . Hypertension Father   . Cancer Mother     cervical   Family Psychiatric  History: non contributory  Social History: reports he is divorced, has three adult children, cannot identify where he resides at this time History  Alcohol Use  . 8.4 oz/week  . 14 Cans of beer per week     History  Drug Use No    Social History   Social History  . Marital status: Divorced    Spouse name: N/A  . Number of children: N/A  . Years of education: N/A   Occupational History  . Retired    Social History Main Topics  . Smoking status: Former Smoker    Types: Cigarettes    Quit date: 12/08/1965  . Smokeless tobacco: Never Used     Comment: "social cigarette smoker in my late teens"  . Alcohol use 8.4 oz/week    14 Cans of beer per week  . Drug use: No  . Sexual activity: No   Other Topics Concern  . None   Social History Narrative   Patient lives alone.   Additional Social History:    Allergies:   Allergies  Allergen Reactions  . Amlodipine Besylate Swelling and Other (See Comments)    "started off low and ended up severe; if, in fact, that is what's causing the swelling" (06/03/12)  . Levaquin [Levofloxacin In D5w] Rash    Labs:  Results for orders placed or performed during the hospital encounter of 12/29/16 (from the past 48 hour(s))  CBC     Status:  Abnormal   Collection Time: 01/02/17  5:59 AM  Result Value Ref Range   WBC 13.2 (H) 4.0 - 10.5 K/uL   RBC 4.45 4.22 - 5.81 MIL/uL   Hemoglobin 12.0 (L) 13.0 - 17.0 g/dL   HCT 39.9 39.0 - 52.0 %   MCV 89.7 78.0 - 100.0 fL   MCH 27.0 26.0 - 34.0 pg   MCHC 30.1 30.0 - 36.0 g/dL   RDW 16.8 (H) 11.5 - 15.5 %   Platelets 287 150 - 400 K/uL  Basic metabolic panel     Status: Abnormal   Collection Time: 01/02/17  5:59 AM  Result Value Ref Range   Sodium 151 (H) 135 - 145 mmol/L   Potassium 4.0 3.5 - 5.1 mmol/L   Chloride 111 101 - 111 mmol/L   CO2 30 22 - 32 mmol/L   Glucose, Bld 118 (H) 65 - 99 mg/dL   BUN 32 (H) 6 - 20 mg/dL   Creatinine, Ser  1.16 0.61 - 1.24 mg/dL   Calcium 10.1 8.9 - 10.3 mg/dL   GFR calc non Af Amer >60 >60 mL/min   GFR calc Af Amer >60 >60 mL/min    Comment: (NOTE) The eGFR has been calculated using the CKD EPI equation. This calculation has not been validated in all clinical situations. eGFR's persistently <60 mL/min signify possible Chronic Kidney Disease.    Anion gap 10 5 - 15  Vitamin B12     Status: None   Collection Time: 01/02/17  5:59 AM  Result Value Ref Range   Vitamin B-12 699 180 - 914 pg/mL    Comment: (NOTE) This assay is not validated for testing neonatal or myeloproliferative syndrome specimens for Vitamin B12 levels.   Folate     Status: None   Collection Time: 01/02/17  5:59 AM  Result Value Ref Range   Folate 18.6 >5.9 ng/mL  Iron and TIBC     Status: Abnormal   Collection Time: 01/02/17  5:59 AM  Result Value Ref Range   Iron 43 (L) 45 - 182 ug/dL   TIBC 189 (L) 250 - 450 ug/dL   Saturation Ratios 23 17.9 - 39.5 %   UIBC 146 ug/dL  Ferritin     Status: Abnormal   Collection Time: 01/02/17  5:59 AM  Result Value Ref Range   Ferritin 756 (H) 24 - 336 ng/mL  Reticulocytes     Status: None   Collection Time: 01/02/17  5:59 AM  Result Value Ref Range   Retic Ct Pct 0.8 0.4 - 3.1 %   RBC. 4.45 4.22 - 5.81 MIL/uL   Retic Count,  Manual 35.6 19.0 - 186.0 K/uL  Surgical PCR screen     Status: Abnormal   Collection Time: 01/02/17  8:10 AM  Result Value Ref Range   MRSA, PCR POSITIVE (A) NEGATIVE    Comment: RESULT CALLED TO, READ BACK BY AND VERIFIED WITH: C. Peeler NT 11:05 01/02/17 ([email or web address])    Staphylococcus aureus POSITIVE (A) NEGATIVE    Comment:        The Xpert SA Assay (FDA approved for NASAL specimens in patients over 38 years of age), is one component of a comprehensive surveillance program.  Test performance has been validated by [facility] for patients greater than or equal to 86 year old. It is not intended to diagnose infection nor to guide or monitor treatment.   CBC     Status: Abnormal   Collection Time: 01/03/17  5:15 AM  Result Value Ref Range   WBC 11.9 (H) 4.0 - 10.5 K/uL   RBC 3.94 (L) 4.22 - 5.81 MIL/uL   Hemoglobin 10.7 (L) 13.0 - 17.0 g/dL   HCT 35.5 (L) 39.0 - 52.0 %   MCV 90.1 78.0 - 100.0 fL   MCH 27.2 26.0 - 34.0 pg   MCHC 30.1 30.0 - 36.0 g/dL   RDW 17.0 (H) 11.5 - 15.5 %   Platelets 241 150 - 400 K/uL  Basic metabolic panel     Status: Abnormal   Collection Time: 01/03/17  5:15 AM  Result Value Ref Range   Sodium 149 (H) 135 - 145 mmol/L   Potassium 3.8 3.5 - 5.1 mmol/L   Chloride 112 (H) 101 - 111 mmol/L   CO2 29 22 - 32 mmol/L   Glucose, Bld 119 (H) 65 - 99 mg/dL   BUN 28 (H) 6 - 20 mg/dL   Creatinine, Ser 1.16 0.61 - 1.24 mg/dL  Calcium 9.5 8.9 - 10.3 mg/dL   GFR calc non Af Amer >60 >60 mL/min   GFR calc Af Amer >60 >60 mL/min    Comment: (NOTE) The eGFR has been calculated using the CKD EPI equation. This calculation has not been validated in all clinical situations. eGFR's persistently <60 mL/min signify possible Chronic Kidney Disease.    Anion gap 8 5 - 15    Current Facility-Administered Medications  Medication Dose Route Frequency Provider Last Rate Last Dose  . acetaminophen (TYLENOL) tablet 650 mg  650 mg Oral Q6H PRN Etta Quill, DO       . atorvastatin (LIPITOR) tablet 80 mg  80 mg Oral q1800 Etta Quill, DO   80 mg at 01/02/17 1742  . carvedilol (COREG) tablet 6.25 mg  6.25 mg Oral BID WC Etta Quill, DO   6.25 mg at 01/03/17 0934  . divalproex (DEPAKOTE ER) 24 hr tablet 500 mg  500 mg Oral QHS Etta Quill, DO   500 mg at 01/02/17 2106  . feeding supplement (ENSURE ENLIVE) (ENSURE ENLIVE) liquid 237 mL  237 mL Oral BID BM Bonnielee Haff, MD   237 mL at 01/03/17 1132  . feeding supplement (PRO-STAT SUGAR FREE 64) liquid 30 mL  30 mL Oral Daily Bonnielee Haff, MD   30 mL at 01/03/17 1132  . HYDROcodone-acetaminophen (NORCO/VICODIN) 5-325 MG per tablet 1 tablet  1 tablet Oral Q6H PRN Etta Quill, DO   1 tablet at 01/01/17 2042  . HYDROmorphone (DILAUDID) injection 1-2 mg  1-2 mg Intravenous Q2H PRN Ralene Ok, MD   1 mg at 01/03/17 0157  . hydrOXYzine (ATARAX/VISTARIL) tablet 25 mg  25 mg Oral TID PRN Etta Quill, DO      . pantoprazole (PROTONIX) EC tablet 40 mg  40 mg Oral BID Etta Quill, DO   40 mg at 01/03/17 1132  . piperacillin-tazobactam (ZOSYN) IVPB 3.375 g  3.375 g Intravenous Q8H Erenest Blank, RPH 12.5 mL/hr at 01/03/17 0530 3.375 g at 01/03/17 0530  . QUEtiapine (SEROQUEL) tablet 25 mg  25 mg Oral BID Etta Quill, DO   25 mg at 01/03/17 1133  . saccharomyces boulardii (FLORASTOR) capsule 250 mg  250 mg Oral BID Bonnielee Haff, MD   250 mg at 01/03/17 1132  . thiamine (VITAMIN B-1) tablet 100 mg  100 mg Oral Daily Bonnielee Haff, MD      . vancomycin (VANCOCIN) IVPB 1000 mg/200 mL premix  1,000 mg Intravenous Q12H Rolla Flatten, RPH   1,000 mg at 01/03/17 1150    Musculoskeletal: Strength & Muscle Tone: within normal limits Gait & Station: gait not examined  Patient leans: N/A  Psychiatric Specialty Exam: Physical Exam  ROS denies headache, denies chest pain, reports feeling thirsty, at this time denies any pain, states " it just feels raw"  Blood pressure (!) 145/69, pulse  79, temperature 98.2 F (36.8 C), temperature source Oral, resp. rate 18, height 5' 10"  (1.778 m), weight (!) 136.4 kg (300 lb 11.3 oz), SpO2 96 %.Body mass index is 43.15 kg/m.  General Appearance: Fairly Groomed  Eye Contact:  Good  Speech:  Slow  Volume:  Decreased  Mood:  denies depression, states his mood is " OK"  Affect:  does not appear depressed or sad, affect reactive , smiles appropriately   Thought Process:  Disorganized, slowed   Orientation:  Other:  partial orientation , oriented to person , partially to place, not  to time   Thought Content:  denies hallucinations, and does not appear internally preoccupied   Suicidal Thoughts:  No at this time denies any suicidal or self injurious ideations, denies any homicidal or violent ideations   Homicidal Thoughts:  No  Memory:  patient unable to cooperate with three objects recall test - memory seems impaired   Judgement:  Impaired  Insight:  Lacking  Psychomotor Activity:  Decreased- not currently restless or agitated   Concentration:  Concentration: Fair and Attention Span: Fair  Recall:  Poor  Fund of Knowledge:  Fair  Language:  Fair  Akathisia:  Negative  Handed:  Right  AIMS (if indicated):     Assets:  Desire for Improvement Resilience  ADL's:  Impaired  Cognition:  Impaired,  Moderate  Sleep:          Disposition: No evidence of imminent risk to self or others at present.   Patient does not meet criteria for psychiatric inpatient admission.  At this time no clear indication for antidepressant medication, as patient not endorsing /presenting with any depressive symptoms at this time.  Agree with low dose Seroquel for management of  Delirium/agitation- consider tapering to 12.5 mgrs BIDas he continues to improve, to minimize side effects.  Would continue opiate analgesia for severe pain as needed, taper down gradually as tolerated to minimize adverse effects on cognition  Patient is currently on Depakote ER-  would review indication for continuing and consider tapering off if no clear indication. Monitor Valproic Acid Serum level if continued  Psychiatry will follow with you Neita Garnet, MD 01/03/2017 2:04 PM

## 2017-01-03 NOTE — Progress Notes (Signed)
Progress Note: General Surgery Service   Subjective: Patient would like to die, does not want to be touched  Objective: Vital signs in last 24 hours: Temp:  [97.3 F (36.3 C)-98.7 F (37.1 C)] 98.7 F (37.1 C) (01/27 0517) Pulse Rate:  [71-83] 74 (01/27 0517) Resp:  [16-20] 17 (01/27 0517) BP: (98-151)/(47-95) 151/66 (01/27 0517) SpO2:  [93 %-100 %] 95 % (01/27 0517) Last BM Date: 12/31/16  Intake/Output from previous day: 01/26 0701 - 01/27 0700 In: 1580 [P.O.:1080; I.V.:500] Out: 775 [Urine:725; Blood:50] Intake/Output this shift: No intake/output data recorded.  Neuro: answers questions appropriately  Back: large sacral wound repacked at beside, minimal bloody drainage.  Lab Results: CBC   Recent Labs  01/02/17 0559 01/03/17 0515  WBC 13.2* 11.9*  HGB 12.0* 10.7*  HCT 39.9 35.5*  PLT 287 241   BMET  Recent Labs  01/02/17 0559 01/03/17 0515  NA 151* 149*  K 4.0 3.8  CL 111 112*  CO2 30 29  GLUCOSE 118* 119*  BUN 32* 28*  CREATININE 1.16 1.16  CALCIUM 10.1 9.5   PT/INR No results for input(s): LABPROT, INR in the last 72 hours. ABG No results for input(s): PHART, HCO3 in the last 72 hours.  Invalid input(s): PCO2, PO2  Studies/Results:  Anti-infectives: Anti-infectives    Start     Dose/Rate Route Frequency Ordered Stop   01/01/17 2000  vancomycin (VANCOCIN) IVPB 1000 mg/200 mL premix     1,000 mg 200 mL/hr over 60 Minutes Intravenous Every 12 hours 01/01/17 1314     12/30/16 2200  vancomycin (VANCOCIN) 1,500 mg in sodium chloride 0.9 % 500 mL IVPB  Status:  Discontinued     1,500 mg 250 mL/hr over 120 Minutes Intravenous Every 24 hours 12/29/16 2029 01/01/17 1314   12/30/16 0600  Ampicillin-Sulbactam (UNASYN) 3 g in sodium chloride 0.9 % 100 mL IVPB  Status:  Discontinued     3 g 200 mL/hr over 30 Minutes Intravenous Every 8 hours 12/30/16 0518 12/30/16 0537   12/30/16 0600  piperacillin-tazobactam (ZOSYN) IVPB 3.375 g     3.375 g 12.5  mL/hr over 240 Minutes Intravenous Every 8 hours 12/30/16 0537     12/29/16 2030  vancomycin (VANCOCIN) 2,000 mg in sodium chloride 0.9 % 500 mL IVPB     2,000 mg 250 mL/hr over 120 Minutes Intravenous  Once 12/29/16 2028 12/29/16 2306      Medications: Scheduled Meds: . atorvastatin  80 mg Oral q1800  . carvedilol  6.25 mg Oral BID WC  . divalproex  500 mg Oral QHS  . feeding supplement (ENSURE ENLIVE)  237 mL Oral BID BM  . feeding supplement (PRO-STAT SUGAR FREE 64)  30 mL Oral Daily  . pantoprazole  40 mg Oral BID  . piperacillin-tazobactam (ZOSYN)  IV  3.375 g Intravenous Q8H  . QUEtiapine  25 mg Oral BID  . saccharomyces boulardii  250 mg Oral BID  . sodium hypochlorite   Irrigation BID  . thiamine injection  100 mg Intravenous Daily  . vancomycin  1,000 mg Intravenous Q12H   Continuous Infusions: PRN Meds:.acetaminophen, HYDROcodone-acetaminophen, HYDROmorphone (DILAUDID) injection, hydrOXYzine  Assessment/Plan: Patient Active Problem List   Diagnosis Date Noted  . Decubitus ulcer of sacral region, unstageable (HCC) 12/30/2016  . Delirium 12/30/2016  . Cellulitis of lower extremity   . Hyponatremia 07/15/2015  . Cellulitis of right lower extremity 07/15/2015  . Acute on chronic renal failure (HCC)   . Hypokalemia   . Chronic  atrial fibrillation (HCC) 07/09/2015  . Chronic anemia 07/09/2015  . CHF exacerbation (HCC) 07/08/2015  . Chronic diastolic CHF (congestive heart failure) (HCC) 04/02/2015  . Chronic diastolic heart failure (HCC) 01/25/2015  . Cellulitis 01/17/2015  . Pulmonary vascular congestion   . Congestive heart disease (HCC)   . Peripheral edema   . Acute exacerbation of CHF (congestive heart failure) (HCC) 01/04/2015  . 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 Renal artery stenosis (HCC) 02/17/2014  . Peripheral vascular disease (HCC)  04/15/2013  . Chronic venous insufficiency 04/15/2013  . Morbid obesity (HCC) 06/11/2012  . Acute on chronic diastolic CHF (congestive heart failure) (HCC) 06/02/2012  . Unspecified essential hypertension 05/18/2012  . Atrial fibrillation (HCC) 04/27/2012  . CHF (congestive heart failure) (HCC) 04/27/2012  . Hypertension 04/27/2012   s/p Procedure(s): DEBRIDMENT OF DECUBITUS ULCER 01/02/2017 -continue daily dakins to wound -will place palliative consult at patients request -WBC improved, continue abx    LOS: 4 days   Rodman PickleLuke Aaron Britnee Mcdevitt, MD Pg# [phone] [facility]

## 2017-01-03 NOTE — Plan of Care (Signed)
Problem: Safety: Goal: Ability to remain free from injury will improve Outcome: Progressing Patient son, Sharia ReeveJosh, at bedside. Emergency contact numbers verified. Patient agreed to not pull out IV tubing, foley, or telemetry. Patient demonstrated use of call bell. Patient denied wanting to harm himself stating he was responding to pain of the dressing change. Restraints discontinued. Will continue to monitor.

## 2017-01-03 NOTE — Progress Notes (Signed)
[facility] PROGRESS NOTE  Jesus [name] WUJ:[id number] DOB: [birth year] DOA: 12/29/2016  PCP: [name],[name], [name]  Brief History/Interval Summary: Jesus Dragonouglas M Prevetteis a 71 y.o.malewith medical history significant for A.Fib, HLD, HTN, sacral decubitus ulcer. Patient [facility] with c/o confusion and elevated WBC on routine labs. Concern was for infection in the sacral decubitus. Patient was hospitalized for management. Patient was placed on broad-spectrum antibiotics. Anticoagulation was held. Patient underwent surgical debridement on 1/26.  Reason for Visit: Infected sacral decubitus  Consultants: Gen. surgery  Procedures: Debridement of decubitus ulcer, 1/26  Antibiotics: Vancomycin and Zosyn  Subjective/Interval History: Patient remains confused. States that that he's been having severe pain in his lower back.   ROS: Unable to do due to confusion  Objective:  Vital Signs  Vitals:   01/02/17 1150 01/02/17 1715 01/02/17 2038 01/03/17 0517  BP: (!) 141/95 (!) 125/53 118/62 (!) 151/66  Pulse: 82 75 72 74  Resp: 20 18 16 17   Temp: 97.9 F (36.6 C) 98.2 F (36.8 C) 98.5 F (36.9 C) 98.7 F (37.1 C)  TempSrc:  Oral Oral Oral  SpO2: 96% 93% 94% 95%  Weight:      Height:        Intake/Output Summary (Last 24 hours) at 01/03/17 1006 Last data filed at 01/03/17 0600  Gross per 24 hour  Intake             1580 ml  Output              775 ml  Net              805 ml   Filed Weights   12/29/16 1827 01/01/17 2027  Weight: 136.1 kg (300 lb) (!) 136.4 kg (300 lb 11.3 oz)    General appearance: alert, distracted and no distress Resp: clear to auscultation bilaterally Cardio: regular rate and rhythm, S1, S2 normal, no murmur, click, rub or gallop GI: soft, non-tender; bowel sounds normal; no masses,  no organomegaly Unstageable pressure injury to lower back Neurologic: Remains confused. Moving all his  extremities. No obvious neurological deficit is noted.  Lab Results:  Data Reviewed: I have personally reviewed following labs and imaging studies  CBC:  Recent Labs Lab 12/29/16 1916 01/01/17 0518 01/02/17 0559 01/03/17 0515  WBC 13.8* 10.4 13.2* 11.9*  NEUTROABS 11.6*  --   --   --   HGB 11.7* 11.5* 12.0* 10.7*  HCT 35.8* 37.3* 39.9 35.5*  MCV 85.6 88.6 89.7 90.1  PLT 332 287 287 241    Basic Metabolic Panel:  Recent Labs Lab 12/29/16 1916 01/01/17 0518 01/02/17 0559 01/03/17 0515  NA 139 154* 151* 149*  K 3.8 3.6 4.0 3.8  CL 95* 115* 111 112*  CO2 29 30 30 29   GLUCOSE 141* 115* 118* 119*  BUN 86* 39* 32* 28*  CREATININE 1.63* 1.11 1.16 1.16  CALCIUM 9.7 9.9 10.1 9.5    GFR: Estimated Creatinine Clearance: 82.5 mL/min (by C-G formula based on SCr of 1.16 mg/dL).  Liver Function Tests:  Recent Labs Lab 12/29/16 1916 01/01/17 0518  AST 31 23  ALT 23 20  ALKPHOS 200* 171*  BILITOT 1.5* 1.5*  PROT 6.4* 6.3*  ALBUMIN 2.3* 1.9*     Recent Labs Lab 12/29/16 1916  LIPASE 27   Cardiac Enzymes:  Recent Labs Lab 12/30/16 0618  CKTOTAL 29*     Recent Results (from the past 240 hour(s))  Culture, blood (routine x 2)     Status: None   Collection Time: 12/29/16  7:16 PM  Result Value Ref Range Status   Specimen Description BLOOD RIGHT ANTECUBITAL  Final   Special Requests BOTTLES DRAWN AEROBIC AND ANAEROBIC 5CC  Final   Culture NO GROWTH 5 DAYS  Final   Report Status 01/03/2017 FINAL  Final  Culture, blood (routine x 2)     Status: None   Collection Time: 12/29/16  8:59 PM  Result Value Ref Range Status   Specimen Description BLOOD RIGHT HAND  Final   Special Requests IN PEDIATRIC BOTTLE 4CC  Final   Culture NO GROWTH 5 DAYS  Final   Report Status 01/03/2017 FINAL  Final  Surgical PCR screen     Status: Abnormal   Collection Time: 01/02/17  8:10 AM  Result Value Ref Range Status   MRSA, PCR POSITIVE (A) NEGATIVE Final    Comment: RESULT  CALLED TO, READ BACK BY AND VERIFIED WITH: C. Peeler NT 11:05 01/02/17 ([email or web address])    Staphylococcus aureus POSITIVE (A) NEGATIVE Final    Comment:        The Xpert SA Assay (FDA approved for NASAL specimens in patients over 59 years of age), is one component of a comprehensive surveillance program.  Test performance has been validated by [facility] for patients greater than or equal to 30 year old. It is not intended to diagnose infection nor to guide or monitor treatment.       Radiology Studies: No results found.   Medications:  Scheduled: . atorvastatin  80 mg Oral q1800  . carvedilol  6.25 mg Oral BID WC  . divalproex  500 mg Oral QHS  . feeding supplement (ENSURE ENLIVE)  237 mL Oral BID BM  . feeding supplement (PRO-STAT SUGAR FREE 64)  30 mL Oral Daily  . pantoprazole  40 mg Oral BID  . piperacillin-tazobactam (ZOSYN)  IV  3.375 g Intravenous Q8H  . QUEtiapine  25 mg Oral BID  . saccharomyces boulardii  250 mg Oral BID  . thiamine injection  100 mg Intravenous Daily  . vancomycin  1,000 mg Intravenous Q12H   Continuous:  WUJ:WJXBJYNWGNFAO, HYDROcodone-acetaminophen, HYDROmorphone (DILAUDID) injection, hydrOXYzine  Assessment/Plan:  Principal Problem:   Decubitus ulcer of sacral region, unstageable [facility]) Active Problems:   Atrial fibrillation (HCC)   Chronic venous insufficiency   Chronic diastolic CHF (congestive heart failure) (HCC)   Acute on chronic renal failure (HCC)   Delirium    Infected Decubitus ulcer of sacral region Large sacral decubitus ulcer, unstageable. Concerns for infection. CT scan did show some air pockets without any evidence for abscess. Wound care was consulted. Subsequently, general surgery was consulted. Patient's anticoagulation was held. Patient underwent surgical debridement 1/26. Continue vancomycin and Zosyn for now. Discussed with general surgery today.  Acute Delirium Presumably secondary to above. Spoke to the nursing  staff at [facility]. Patient has been there for about 4-5 weeks and has exhibited strange behavior. He has been confused. Sometimes verbally abusive. Sometimes would do bizarre stuff such as pouring water into the medication cup. Discussed in detail with his son today. He tells me that his father has had some memory issues that never formally diagnosed with dementia.. B-12 level was normal. TSH was normal in December. HIV and RPR are pending. Care everywhere notes were reviewed. It appears the patient was on Aricept at one point in time. This is mentioned on the medication list at the cardiology visit  in August 2017. It is also noted that the patient has a history of alcohol intake. So it quite likely that he has cognitive impairment. Continue thiamine, considering history of alcohol use in the past. Obtain EEG. We will go ahead and get MRI brain also when he undergoes MRI of his abdomen as discussed below.  Suicidal ideation When patient was in severe pain. He mentioned that he wanted his life to end. Difficult to ascertain if this is due to depression or secondary to severe pain. More likely the latter. We will request psychiatry to see him.  A.Fib  CHA2DS2-VASc score is 4. Rate is controlled with Coreg. Patient was on Eliquis, which has been held for surgery. Resume when okay with surgeon. Discussed with Dr. Sheliah Hatch today. He tells me that there was some bleeding noted in the decubitus when examined today. He recommends holding off on anticoagulation for today.  Likely mild AKI on CKD stage 3/hypernatremia Creatinine baseline 1.4, presented with creatinine 1.6 very close to baseline. Diuretics held, given IV fluids. Creatinine has improved. Patient was noted to be hypernatremic. IV fluids were stopped. Sodium level has improved.    Chronic diastolic CHF  Holding lasix and spironolactone for AKI. Seems euvolemic.  Hepatic and pancreatic lesions Cystic lesions incidentally noted on CT scan.  Patient will need further evaluation of this with MRI. Creatinine has improved. Mental status also appears to be stable. Plan to attempt MRI tomorrow.  Chronic venous insufficiency  Stable.  DVT Prophylaxis: Eliquis on hold. SCDs.    Code Status: Full code  Family Communication: Discussed with his son today Disposition Plan: Continue antibiotics as mentioned above. PT evaluation. Psychiatry to see. MRI of the abdomen along with MRI brain will be ordered tomorrow.    LOS: 4 days   [facility]  [facility] Pager [phone] 01/03/2017, 10:06 AM  If 7PM-7AM, please contact night-coverage at [email or web address], password [facility]

## 2017-01-04 ENCOUNTER — Inpatient Hospital Stay (HOSPITAL_COMMUNITY): Payer: Medicare HMO

## 2017-01-04 LAB — VALPROIC ACID LEVEL: Valproic Acid Lvl: <10 ug/mL — ABNORMAL LOW (ref 50.0–100.0)

## 2017-01-04 NOTE — Progress Notes (Signed)
Pharmacy Antibiotic Note  Jesus [name] is a 71 y.o. male admitted on 12/29/2016 with wound infection.  Pharmacy has been consulted for Vancomycin & Zosyn dosing.   The patient's renal function has been stable over the last 48-72 hours, SCr 1.16, CrCl~60-65 ml/min (normalized). S/p I&D on 1/26 where they tracked down to sacral bone, but was debrided to healthy tissue. Per discussion with Dr. Rito EhrlichKrishnan, who talked with surgery, plan is to continue current abx for the time being.  Plan: 1. Continue Vancomycin to 1g IV every 12 hours. VT tomorrow AM 2. Continue Zosyn 3.375g IV every 8 hours (infused over 4 hours) 3. Will continue to follow renal function, culture results, LOT, and antibiotic de-escalation plans   Height: 5\' 10"  (177.8 cm) Weight: (!) 300 lb 11.3 oz (136.4 kg) IBW/kg (Calculated) : 73  Temp (24hrs), Avg:98.4 F (36.9 C), Min:98.2 F (36.8 C), Max:98.6 F (37 C)   Recent Labs Lab 12/29/16 1916 12/29/16 1928 12/30/16 0410 12/30/16 0618 01/01/17 0518 01/02/17 0559 01/03/17 0515  WBC 13.8*  --   --   --  10.4 13.2* 11.9*  CREATININE 1.63*  --   --   --  1.11 1.16 1.16  LATICACIDVEN  --  1.65 1.1 1.0  --   --   --     Estimated Creatinine Clearance: 82.5 mL/min (by C-G formula based on SCr of 1.16 mg/dL).    Allergies  Allergen Reactions  . Amlodipine Besylate Swelling and Other (See Comments)    "started off low and ended up severe; if, in fact, that is what's causing the swelling" (06/03/12)  . Levaquin [Levofloxacin In D5w] Rash   Vanc 1/22>> Zosyn 1/23>>  Dose adjustments this admission:  N/a  Microbiology results:  1/22 BCx: no growth 1/26 MRSA PCR >> positive  Thank you for allowing pharmacy to be a part of this patient's care.  Jesus [name](Jesus [name]), PharmD  PGY1 Pharmacy Resident Pager: [phone] 01/04/2017 9:28 AM

## 2017-01-04 NOTE — Progress Notes (Signed)
[facility] PROGRESS NOTE  Jesus [name] UEA:[id number] DOB: 1946-07-04 DOA: 12/29/2016  PCP: Miki Kins  Brief History/Interval Summary: [place] Dragon Prevetteis a 71 y.o.malewith medical history significant for A.Fib, HLD, HTN, sacral decubitus ulcer. Patient presented to the ED via EMS from [facility] with c/o confusion and elevated WBC on routine labs. Concern was for infection in the sacral decubitus. Patient was hospitalized for management. Patient was placed on broad-spectrum antibiotics. Anticoagulation was held. Patient underwent surgical debridement on 1/26.  Reason for Visit: Infected sacral decubitus  Consultants: Gen. surgery  Procedures: Debridement of decubitus ulcer, 1/26  Antibiotics: Vancomycin and Zosyn  Subjective/Interval History: No changes noted. Patient remains confused. States that pain is better.   ROS: Unable to do due to confusion  Objective:  Vital Signs  Vitals:   01/03/17 1856 01/03/17 2126 01/04/17 0533 01/04/17 0954  BP: (!) 150/72 (!) 161/70 (!) 145/78 (!) 160/84  Pulse: 80 85 74 70  Resp: 18 18 18 18   Temp: 98.4 F (36.9 C) 98.2 F (36.8 C) 98.6 F (37 C) 98 F (36.7 C)  TempSrc: Oral Oral Oral Oral  SpO2: 97% 95% 96% 94%  Weight:      Height:        Intake/Output Summary (Last 24 hours) at 01/04/17 1348 Last data filed at 01/04/17 1300  Gross per 24 hour  Intake              770 ml  Output              850 ml  Net              -80 ml   Filed Weights   12/29/16 1827 01/01/17 2027  Weight: 136.1 kg (300 lb) (!) 136.4 kg (300 lb 11.3 oz)    General appearance: alert, distracted and no distress Resp: clear to auscultation bilaterally Cardio: regular rate and rhythm, S1, S2 normal, no murmur, click, rub or gallop GI: soft, non-tender; bowel sounds normal; no masses,  no organomegaly Neurologic: Remains confused. Moving all his extremities. No obvious neurological deficit is noted.  Lab  Results:  Data Reviewed: I have personally reviewed following labs and imaging studies  CBC:  Recent Labs Lab 12/29/16 1916 01/01/17 0518 01/02/17 0559 01/03/17 0515  WBC 13.8* 10.4 13.2* 11.9*  NEUTROABS 11.6*  --   --   --   HGB 11.7* 11.5* 12.0* 10.7*  HCT 35.8* 37.3* 39.9 35.5*  MCV 85.6 88.6 89.7 90.1  PLT 332 287 287 241    Basic Metabolic Panel:  Recent Labs Lab 12/29/16 1916 01/01/17 0518 01/02/17 0559 01/03/17 0515  NA 139 154* 151* 149*  K 3.8 3.6 4.0 3.8  CL 95* 115* 111 112*  CO2 29 30 30 29   GLUCOSE 141* 115* 118* 119*  BUN 86* 39* 32* 28*  CREATININE 1.63* 1.11 1.16 1.16  CALCIUM 9.7 9.9 10.1 9.5    GFR: Estimated Creatinine Clearance: 82.5 mL/min (by C-G formula based on SCr of 1.16 mg/dL).  Liver Function Tests:  Recent Labs Lab 12/29/16 1916 01/01/17 0518  AST 31 23  ALT 23 20  ALKPHOS 200* 171*  BILITOT 1.5* 1.5*  PROT 6.4* 6.3*  ALBUMIN 2.3* 1.9*     Recent Labs Lab 12/29/16 1916  LIPASE 27   Cardiac Enzymes:  Recent Labs Lab 12/30/16 0618  CKTOTAL 29*     Recent Results (from the past 240 hour(s))  Culture, blood (routine x 2)  Status: None   Collection Time: 12/29/16  7:16 PM  Result Value Ref Range Status   Specimen Description BLOOD RIGHT ANTECUBITAL  Final   Special Requests BOTTLES DRAWN AEROBIC AND ANAEROBIC 5CC  Final   Culture NO GROWTH 5 DAYS  Final   Report Status 01/03/2017 FINAL  Final  Culture, blood (routine x 2)     Status: None   Collection Time: 12/29/16  8:59 PM  Result Value Ref Range Status   Specimen Description BLOOD RIGHT HAND  Final   Special Requests IN PEDIATRIC BOTTLE 4CC  Final   Culture NO GROWTH 5 DAYS  Final   Report Status 01/03/2017 FINAL  Final  Surgical PCR screen     Status: Abnormal   Collection Time: 01/02/17  8:10 AM  Result Value Ref Range Status   MRSA, PCR POSITIVE (A) NEGATIVE Final    Comment: RESULT CALLED TO, READ BACK BY AND VERIFIED WITH: C. Peeler NT 11:05  01/02/17 ([email or web address])    Staphylococcus aureus POSITIVE (A) NEGATIVE Final    Comment:        The Xpert SA Assay (FDA approved for NASAL specimens in patients over 71 years of age), is one component of a comprehensive surveillance program.  Test performance has been validated by [facility] for patients greater than or equal to 71 year old. It is not intended to diagnose infection nor to guide or monitor treatment.       Radiology Studies: No results found.   Medications:  Scheduled: . atorvastatin  80 mg Oral q1800  . carvedilol  6.25 mg Oral BID WC  . divalproex  500 mg Oral QHS  . feeding supplement (ENSURE ENLIVE)  237 mL Oral BID BM  . feeding supplement (PRO-STAT SUGAR FREE 64)  30 mL Oral Daily  . pantoprazole  40 mg Oral BID  . piperacillin-tazobactam (ZOSYN)  IV  3.375 g Intravenous Q8H  . QUEtiapine  25 mg Oral BID  . saccharomyces boulardii  250 mg Oral BID  . thiamine  100 mg Oral Daily  . vancomycin  1,000 mg Intravenous Q12H   Continuous:  UJW:JXBJYNWGNFAOZPRN:acetaminophen, HYDROcodone-acetaminophen, HYDROmorphone (DILAUDID) injection, hydrOXYzine  Assessment/Plan:  Principal Problem:   Decubitus ulcer of sacral region, unstageable [facility](HCC) Active Problems:   Atrial fibrillation (HCC)   Chronic venous insufficiency   Chronic diastolic CHF (congestive heart failure) (HCC)   Acute on chronic renal failure (HCC)   Delirium    Infected Decubitus ulcer of sacral region Large sacral decubitus ulcer, unstageable with concerns for infection. CT scan did show some air pockets without any evidence for abscess. Wound care was consulted. Subsequently, general surgery was consulted. Patient's anticoagulation was held. Patient underwent surgical debridement 1/26. Continue vancomycin and Zosyn for now. General surgery plans to reexamine wound tomorrow. We'll discuss with them at that time regarding de-escalation of antibiotics.  Acute Delirium Some of this is due to baseline  cognitive impairment. According to patient's son, patient has had issues with memory for a few months. Patient was noted to be on Aricept a few months ago. Currently on Depakote and Seroquel. Mental status has been stable. Workup done here included B-12 level which was normal. TSH normal. RPR and HIV are nonreactive. EEG is pending. We'll also proceed with MRI brain. Continue thiamine considering history of alcohol use in the past. Check Depakote level. Patient's verbalization of wanting to hurt himself, was most likely secondary to pain. Seen by psychiatry. Appreciate their input. No further intervention needed.  A.Fib  CHA2DS2-VASc score is 4. Rate is controlled with Coreg. Patient was on Eliquis, which has been held for surgery. Resume when okay with surgeon. Discussed with Dr. Sheliah Hatch 1/28. He tells me that there was some bleeding noted in the decubitus when examined. He recommends holding off on anticoagulation. Wound was not reexamined today. I Careers adviser. We'll discuss with general surgery tomorrow regarding reinitiation of anticoagulation.  Likely mild AKI on CKD stage 3/hypernatremia Creatinine baseline 1.4, presented with creatinine 1.6 very close to baseline. Diuretics held, given IV fluids. Creatinine has improved. Patient was noted to be hypernatremic. IV fluids were stopped. Sodium level has improved.    Chronic diastolic CHF  Holding lasix and spironolactone for AKI. Seems euvolemic.  Hepatic and pancreatic lesions Cystic lesions incidentally noted on CT scan. Since creatinine has improved, proceed with MRI Abdomen to further evaluate these lesions.   Chronic venous insufficiency  Stable.  Patient also noted to have generalized deconditioning. His quality of life appears to be poor. His activity level and functional status is not known, but likely poor considering presence of decubitus ulcer. Patient may benefit from palliative medicine input for goals of care. We will decide on  this after results of MRI is available.  DVT Prophylaxis: Eliquis on hold. SCDs.    Code Status: Full code  Family Communication: Discussed with his son 1/27 Disposition Plan: Management as mentioned above. PT evaluation is pending. EEG, MRI brain and abdomen also pending.    LOS: 5 days   [facility]  [facility] Pager [phone] 01/04/2017, 1:48 PM  If 7PM-7AM, please contact night-coverage at [email or web address], password [facility]

## 2017-01-04 NOTE — Progress Notes (Signed)
MRI Brain completed. This exam is however motion degraded due to pt yelling in pain during exam. The detail needed to competently follow the abnormalcies on pt's CT I do not feel we will obtain with pt's condition at the moment. Perhaps try again in the am when pt will be tolerating one exam or if pt can be premedicated for pain prior to exam ?  Pt keeps begging to stop exam and he can't do it.  Please call with any questions Eustaquio Boyden-Meleia [place] Rt,R,MR ext [phone]

## 2017-01-04 NOTE — Progress Notes (Addendum)
General Surgery:  We have ordered twice a day dressing changes to the sacral decubitus wound We will not examine the wound today We will follow-up tomorrow   Jesus MouldHaywood M. Derrell LollingIngram, M.D., [facility] General and Minimally invasive Surgery Breast and Colorectal Surgery Office:   [phone]

## 2017-01-05 ENCOUNTER — Inpatient Hospital Stay (HOSPITAL_COMMUNITY): Payer: Medicare HMO

## 2017-01-05 DIAGNOSIS — D649 Anemia, unspecified: Secondary | ICD-10-CM

## 2017-01-05 DIAGNOSIS — R4182 Altered mental status, unspecified: Secondary | ICD-10-CM

## 2017-01-05 LAB — CBC
HCT: 28.9 % — ABNORMAL LOW (ref 39.0–52.0)
Hemoglobin: 8.9 g/dL — ABNORMAL LOW (ref 13.0–17.0)
MCH: 27.2 pg (ref 26.0–34.0)
MCHC: 30.8 g/dL (ref 30.0–36.0)
MCV: 88.4 fL (ref 78.0–100.0)
PLATELETS: 212 10*3/uL (ref 150–400)
RBC: 3.27 MIL/uL — ABNORMAL LOW (ref 4.22–5.81)
RDW: 16.5 % — AB (ref 11.5–15.5)
WBC: 10.5 10*3/uL (ref 4.0–10.5)

## 2017-01-05 LAB — BASIC METABOLIC PANEL
ANION GAP: 7 (ref 5–15)
BUN: 28 mg/dL — ABNORMAL HIGH (ref 6–20)
CALCIUM: 9.3 mg/dL (ref 8.9–10.3)
CO2: 28 mmol/L (ref 22–32)
CREATININE: 1.19 mg/dL (ref 0.61–1.24)
Chloride: 108 mmol/L (ref 101–111)
GFR calc Af Amer: >60 mL/min (ref >60)
GLUCOSE: 114 mg/dL — AB (ref 65–99)
Potassium: 3.5 mmol/L (ref 3.5–5.1)
Sodium: 143 mmol/L (ref 135–145)

## 2017-01-05 LAB — VANCOMYCIN, TROUGH: Vancomycin Tr: 36 ug/mL (ref 15–20)

## 2017-01-05 MED ORDER — VANCOMYCIN HCL IN DEXTROSE 1-5 GM/200ML-% IV SOLN
1000.0000 mg | INTRAVENOUS | Status: AC
  Administered 2017-01-05 – 2017-01-11 (×7): 1000 mg via INTRAVENOUS
  Filled 2017-01-05 (×7): qty 200

## 2017-01-05 MED ORDER — QUETIAPINE FUMARATE 25 MG PO TABS
12.5000 mg | ORAL_TABLET | Freq: Two times a day (BID) | ORAL | Status: DC
  Administered 2017-01-07 – 2017-01-23 (×34): 12.5 mg via ORAL
  Filled 2017-01-05 (×35): qty 1

## 2017-01-05 MED ORDER — LORAZEPAM 2 MG/ML IJ SOLN
1.0000 mg | Freq: Once | INTRAMUSCULAR | Status: DC
  Filled 2017-01-05: qty 1

## 2017-01-05 MED ORDER — MUPIROCIN 2 % EX OINT
1.0000 "application " | TOPICAL_OINTMENT | Freq: Two times a day (BID) | CUTANEOUS | Status: AC
  Administered 2017-01-05 – 2017-01-09 (×9): 1 via NASAL
  Filled 2017-01-05 (×3): qty 22

## 2017-01-05 MED ORDER — CHLORHEXIDINE GLUCONATE CLOTH 2 % EX PADS
6.0000 | MEDICATED_PAD | Freq: Every day | CUTANEOUS | Status: AC
  Administered 2017-01-06 – 2017-01-10 (×3): 6 via TOPICAL

## 2017-01-05 NOTE — Evaluation (Signed)
Physical Therapy Evaluation Patient Details Name: Jesus [name] Hyle MRN: [id number] DOB: 07/22/1946 Today's Date: 01/05/2017   History of Present Illness   Jesus [name] Zoll is a 71 y.o. male with medical history significant of A.Fib, HLD, HTN, sacral decubitus ulcer.  Patient [facility] ED [facility] with c/o confusion and elevated WBC today on routine labs.    Clinical Impression  Pt admitted with/for complications above.  Pt lethargic and later resistant and anxious during therapy, needing 2 person total assist for safety.  Pt currently limited functionally due to the problems listed. ( See problems list.)   Pt will benefit from PT to maximize function and safety in order to get ready for next venue listed below.     Follow Up Recommendations SNF    Equipment Recommendations  Other (comment) (TBA)    Recommendations for Other Services       Precautions / Restrictions Precautions Precautions: Fall Restrictions Weight Bearing Restrictions: No      Mobility  Bed Mobility Overal bed mobility: Needs Assistance Bed Mobility: Rolling;Sidelying to Sit;Sit to Sidelying Rolling: Total assist;+2 for physical assistance Sidelying to sit: +2 for physical assistance;Total assist     Sit to sidelying: Total assist;+2 for physical assistance General bed mobility comments: pt often more resistant than helpful,   Transfers                 General transfer comment: not able today  Ambulation/Gait                Stairs            Wheelchair Mobility    Modified Rankin (Stroke Patients Only)       Balance Overall balance assessment: Needs assistance Sitting-balance support: Bilateral upper extremity supported Sitting balance-Leahy Scale: Poor Sitting balance - Comments: sat EOB 12+ min working on sitting balance, truncal acitivation, sitting tolerance.  pt needed assist to stay forward 90% of the time.  time spent on L  elbow.                                     Pertinent Vitals/Pain Pain Assessment: Faces Faces Pain Scale: Hurts even more Pain Location: knees, R Leg Pain Descriptors / Indicators: Moaning;Grimacing;Discomfort;Sore;Shooting Pain Intervention(s): Monitored during session;Repositioned    Home Living Family/patient expects to be discharged to:: Skilled nursing facility                      Prior Function Level of Independence: Needs assistance         Comments: priort to Thanksgiving (in general)  was doing relatively well then decline ocurred until pt was not able to or was unwilling to move due to pain on R side.     Hand Dominance        Extremity/Trunk Assessment   Upper Extremity Assessment Upper Extremity Assessment: Defer to OT evaluation    Lower Extremity Assessment Lower Extremity Assessment: Generalized weakness;RLE deficits/detail;LLE deficits/detail (pt not following direction to complete MMT) RLE Deficits / Details: 10-15* ext contracture at knee LLE Deficits / Details: 10* knee extension contracture.       Communication   Communication: No difficulties  Cognition Arousal/Alertness: Awake/alert Behavior During Therapy: Anxious Overall Cognitive Status: Impaired/Different from baseline  General Comments      Exercises     Assessment/Plan    PT Assessment Patient needs continued PT services  PT Problem List Decreased strength;Decreased range of motion;Decreased activity tolerance;Decreased balance;Decreased mobility;Decreased coordination;Pain          PT Treatment Interventions DME instruction;Functional mobility training;Therapeutic activities;Therapeutic exercise;Balance training;Patient/family education    PT Goals (Current goals can be found in the Care Plan section)  Acute Rehab PT Goals Patient Stated Goal: continue rehab at nursing home PT Goal Formulation: With patient/family Time  For Goal Achievement: 01/19/17 Potential to Achieve Goals: Fair    Frequency Min 3X/week   Barriers to discharge        Co-evaluation               End of Session   Activity Tolerance: Patient tolerated treatment well Patient left: in bed;with call bell/phone within reach;with bed alarm set;with family/visitor present Nurse Communication: Mobility status         Time: 1222-1310 PT Time Calculation (min) (ACUTE ONLY): 48 min   Charges:   PT Evaluation $PT Eval Moderate Complexity: 1 Procedure PT Treatments $Therapeutic Activity: 23-37 mins   PT G CodesEliseo Gum Hendel Gatliff 01/05/2017, 5:15 PM 01/05/2017  [place], PT [phone]  (pager)

## 2017-01-05 NOTE — Progress Notes (Signed)
[facility] PROGRESS NOTE  Shawan Corella Pendell WUJ:[id number] DOB: December 26, 1945 DOA: 12/29/2016  PCP: Miki Kins  Brief History/Interval Summary: [place] Dragon Prevetteis a 71 y.o.malewith medical history significant for A.Fib, HLD, HTN, sacral decubitus ulcer. Patient presented to the ED via EMS from [facility] with c/o confusion and elevated WBC on routine labs. Concern was for infection in the sacral decubitus. Patient was hospitalized for management. Patient was placed on broad-spectrum antibiotics. Anticoagulation was held. Patient underwent surgical debridement on 1/26.  Reason for Visit: Infected sacral decubitus  Consultants: Gen. surgery  Procedures: Debridement of decubitus ulcer, 1/26  Antibiotics: Vancomycin and Zosyn  Subjective/Interval History: Patient remains confused. States that pain is better. Somewhat drowsy but easily arousable.  ROS: Unable to do due to confusion  Objective:  Vital Signs  Vitals:   01/04/17 0954 01/04/17 1719 01/04/17 2204 01/05/17 0455  BP: (!) 160/84 (!) 119/58 98/70 (!) 125/56  Pulse: 70 65 72 72  Resp: 18 18 18 16   Temp: 98 F (36.7 C) 98.2 F (36.8 C) 98.4 F (36.9 C) 98 F (36.7 C)  TempSrc: Oral Oral Oral Oral  SpO2: 94% 93% 93% 94%  Weight:      Height:        Intake/Output Summary (Last 24 hours) at 01/05/17 0910 Last data filed at 01/05/17 0456  Gross per 24 hour  Intake                0 ml  Output             1500 ml  Net            -1500 ml   Filed Weights   12/29/16 1827 01/01/17 2027  Weight: 136.1 kg (300 lb) (!) 136.4 kg (300 lb 11.3 oz)    General appearance:Drowsy but arousable Resp: clear to auscultation bilaterally Cardio: regular rate and rhythm, S1, S2 normal, no murmur, click, rub or gallop GI: soft, non-tender; bowel sounds normal; no masses,  no organomegaly Neurologic: Remains confused. Moving all his extremities. No obvious neurological deficit is noted.  Lab  Results:  Data Reviewed: I have personally reviewed following labs and imaging studies  CBC:  Recent Labs Lab 12/29/16 1916 01/01/17 0518 01/02/17 0559 01/03/17 0515 01/05/17 0509  WBC 13.8* 10.4 13.2* 11.9* 10.5  NEUTROABS 11.6*  --   --   --   --   HGB 11.7* 11.5* 12.0* 10.7* 8.9*  HCT 35.8* 37.3* 39.9 35.5* 28.9*  MCV 85.6 88.6 89.7 90.1 88.4  PLT 332 287 287 241 212    Basic Metabolic Panel:  Recent Labs Lab 12/29/16 1916 01/01/17 0518 01/02/17 0559 01/03/17 0515 01/05/17 0509  NA 139 154* 151* 149* 143  K 3.8 3.6 4.0 3.8 3.5  CL 95* 115* 111 112* 108  CO2 29 30 30 29 28   GLUCOSE 141* 115* 118* 119* 114*  BUN 86* 39* 32* 28* 28*  CREATININE 1.63* 1.11 1.16 1.16 1.19  CALCIUM 9.7 9.9 10.1 9.5 9.3    GFR: Estimated Creatinine Clearance: 80.4 mL/min (by C-G formula based on SCr of 1.19 mg/dL).  Liver Function Tests:  Recent Labs Lab 12/29/16 1916 01/01/17 0518  AST 31 23  ALT 23 20  ALKPHOS 200* 171*  BILITOT 1.5* 1.5*  PROT 6.4* 6.3*  ALBUMIN 2.3* 1.9*     Recent Labs Lab 12/29/16 1916  LIPASE 27   Cardiac Enzymes:  Recent Labs Lab 12/30/16 0618  CKTOTAL 29*  Recent Results (from the past 240 hour(s))  Culture, blood (routine x 2)     Status: None   Collection Time: 12/29/16  7:16 PM  Result Value Ref Range Status   Specimen Description BLOOD RIGHT ANTECUBITAL  Final   Special Requests BOTTLES DRAWN AEROBIC AND ANAEROBIC 5CC  Final   Culture NO GROWTH 5 DAYS  Final   Report Status 01/03/2017 FINAL  Final  Culture, blood (routine x 2)     Status: None   Collection Time: 12/29/16  8:59 PM  Result Value Ref Range Status   Specimen Description BLOOD RIGHT HAND  Final   Special Requests IN PEDIATRIC BOTTLE 4CC  Final   Culture NO GROWTH 5 DAYS  Final   Report Status 01/03/2017 FINAL  Final  Surgical PCR screen     Status: Abnormal   Collection Time: 01/02/17  8:10 AM  Result Value Ref Range Status   MRSA, PCR POSITIVE (A)  NEGATIVE Final    Comment: RESULT CALLED TO, READ BACK BY AND VERIFIED WITH: C. Peeler NT 11:05 01/02/17 ([email or web address])    Staphylococcus aureus POSITIVE (A) NEGATIVE Final    Comment:        The Xpert SA Assay (FDA approved for NASAL specimens in patients over 63 years of age), is one component of a comprehensive surveillance program.  Test performance has been validated by [facility] for patients greater than or equal to 83 year old. It is not intended to diagnose infection nor to guide or monitor treatment.       Radiology Studies: Mr Brain 42 Contrast  Result Date: 01/04/2017 CLINICAL DATA:  Initial evaluation for acute encephalopathy, confusion, elevated white blood cell count. EXAM: MRI HEAD WITHOUT CONTRAST TECHNIQUE: Multiplanar, multiecho pulse sequences of the brain and surrounding structures were obtained without intravenous contrast. COMPARISON:  None available. FINDINGS: Brain: Study significantly degraded by motion artifact. Diffuse prominence of the CSF containing spaces is compatible with generalized age related cerebral atrophy. Mild patchy T2/FLAIR hyperintensity within the periventricular and deep white matter both cerebral hemispheres most consistent with chronic small vessel ischemic disease. No abnormal foci of restricted diffusion to suggest acute or subacute ischemia. Gray-white matter differentiation maintained. No evidence for acute or chronic intracranial hemorrhage. No areas of chronic infarction identified. No mass lesion, midline shift or mass effect. Mild ventricular prominence related to global parenchymal volume loss of hydrocephalus. No extra-axial fluid collection. Major dural sinuses are grossly patent. Pituitary gland not well evaluated on this motion degraded study. Vascular: Major intracranial vascular flow voids maintained. Skull and upper cervical spine: Craniocervical junction grossly unremarkable. No obvious abnormality within the upper cervical spine.  Bone marrow signal intensity within normal limits. No scalp soft tissue abnormality. Sinuses/Orbits: Globes and orbital soft tissues grossly unremarkable. No significant paranasal sinus disease identified on this motion degraded study. No obvious mastoid effusion. IMPRESSION: 1. Motion degraded study. 2. No acute intracranial infarct or other process identified. 3. Generalized age-related cerebral atrophy with mild chronic small vessel ischemic disease. Electronically Signed   By: Rise Mu M.D.   On: 01/04/2017 22:11     Medications:  Scheduled: . atorvastatin  80 mg Oral q1800  . carvedilol  6.25 mg Oral BID WC  . divalproex  500 mg Oral QHS  . feeding supplement (ENSURE ENLIVE)  237 mL Oral BID BM  . feeding supplement (PRO-STAT SUGAR FREE 64)  30 mL Oral Daily  . pantoprazole  40 mg Oral BID  . piperacillin-tazobactam (ZOSYN)  IV  3.375 g Intravenous Q8H  . QUEtiapine  12.5 mg Oral BID  . saccharomyces boulardii  250 mg Oral BID  . thiamine  100 mg Oral Daily  . vancomycin  1,000 mg Intravenous Q12H   Continuous:  XBJ:[id number]:acetaminophen, HYDROcodone-acetaminophen, HYDROmorphone (DILAUDID) injection, hydrOXYzine  Assessment/Plan:  Principal Problem:   Decubitus ulcer of sacral region, unstageable [facility](HCC) Active Problems:   Atrial fibrillation (HCC)   Chronic venous insufficiency   Chronic diastolic CHF (congestive heart failure) (HCC)   Acute on chronic renal failure (HCC)   Delirium    Infected Decubitus ulcer of sacral region Large sacral decubitus ulcer, unstageable with concerns for infection. CT scan did show some air pockets without any evidence for abscess. Wound care was consulted. Subsequently, general surgery was consulted. Patient's anticoagulation was held. Patient underwent surgical debridement 1/26. Continue vancomycin and Zosyn for now. General surgery plans to reexamine wound Today. Discussed with the physician assistant with general surgery. They will make  recommendations regarding anticoagulation as well as antibiotics once they have seen the patient.  Acute Delirium Some of this is due to baseline cognitive impairment. According to patient's son, patient has had issues with memory for a few months. Patient was noted to be on Aricept a few months ago. Currently on Depakote and Seroquel. Mental status has been stable. Workup done here included B-12 level which was normal. TSH normal. RPR and HIV are nonreactive. EEG is pending. MRI brain was limited due to motion, however, did not show any acute findings. Atrophy was present. Continue thiamine considering history of alcohol use in the past. Check Depakote level. Patient's verbalization of wanting to hurt himself, was most likely secondary to pain. Seen by psychiatry. Appreciate their input. No further intervention needed.   A.Fib  CHA2DS2-VASc score is 4. Rate is controlled with Coreg. Patient was on Eliquis, which has been held for surgery. Resume when okay with surgeon. There was some bleeding noted in the decubitus when examined by general surgeon on 1/27. Surgery to reexamine wound today and will let us know about resuming anticoagulation. Hemoglobin noted to have dropped some.  Normocytic anemia  Hemoglobin noted to have drifted down. No overt bleeding has been noted. Surgery to reexamine wound today as some bleeding was noted on 1/7.  Likely mild AKI on CKD stage 3/hypernatremia Creatinine baseline 1.4, presented with creatinine 1.6 very close to baseline. Diuretics held, given IV fluids. Creatinine has improved. Patient was noted to be hypernatremic. IV fluids were stopped. Sodium level has improved.    Chronic diastolic CHF  Holding lasix and spironolactone for AKI. Seems euvolemic.  Hepatic and pancreatic lesions Cystic lesions incidentally noted on CT scan. Since creatinine has improved, proceed with MRI Abdomen to further evaluate these lesions. Plan is for this to be done today. He  will need to be given pain medicines prior to MRI so that he remains stable.  Chronic venous insufficiency  Stable.  Patient also noted to have generalized deconditioning. His quality of life appears to be poor. His activity level and functional status is not known, but likely poor considering presence of decubitus ulcer. Patient may benefit from palliative medicine input for goals of care. We will decide on this after results of MRI is available.  DVT Prophylaxis: Eliquis on hold. SCDs.    Code Status: Full code  Family Communication: Discussed with his son 1/27 Disposition Plan: Management as mentioned above. EEG, MRI abdomen also pending.    LOS: 6 days   [facility] Medical CenterKRISHNAN,Rajat Staver  [facility]  Pager [phone] 01/05/2017, 9:10 AM  If 7PM-7AM, please contact night-coverage at [email or web address], password [facility]

## 2017-01-05 NOTE — Progress Notes (Signed)
CRITICAL VALUE ALERT  Critical value received: Vanc Trough: 36  Date of notification: 01/05/17  Time of notification: 0820  Critical value read back: Yes  Nurse who received alert: Ileene RubensAnisha   MD notified (1st page): Rito EhrlichKrishnan  Time of first page: 0825  MD notified (2nd page): Rito EhrlichKrishnan  Time of second page: [phone]  Responding MD: Rito EhrlichKrishnan  Time MD responded: [phone]  MD stated to ask pharmacy. Pharmacy notified. Ordered to hold Vanc dose. Will continue to monitor.

## 2017-01-05 NOTE — Progress Notes (Signed)
Infectious disease stated that patient required MRSA PCR positive standing orders. Orders placed. Will continue to monitor.

## 2017-01-05 NOTE — Progress Notes (Signed)
Rito EhrlichKrishnan, MD notified/updated of current plans regarding patients MRI and inability to take morning medications. Will continue to monitor.

## 2017-01-05 NOTE — Progress Notes (Signed)
3 Days Post-Op  Subjective: Bed ridden at home since December. From SNF with significant confusion.  He cannot give a hx. Everything is painful with movement.  Objective: Vital signs in last 24 hours: Temp:  [98 F (36.7 C)-98.8 F (37.1 C)] 98.8 F (37.1 C) (01/29 0928) Pulse Rate:  [65-82] 82 (01/29 0928) Resp:  [16-18] 18 (01/29 0928) BP: (98-136)/(56-70) 136/61 (01/29 0928) SpO2:  [93 %-95 %] 95 % (01/29 0928) Last BM Date: 01/05/17 50 PO recorded 1500 urine Stool:  None recorded Afebrile, VSS Labs stable    Intake/Output from previous day: 01/28 0701 - 01/29 0700 In: 50 [P.O.:50] Out: 1500 [Urine:1500] Intake/Output this shift: No intake/output data recorded.  General appearance: alert and confused.  Does not want to move.   Incision/Wound: Wound type: Unstageable Pressure Injury Pressure Injury POA: Yes Measurement: 15cm x 10cm x 0.2cm Wound bed:90% soft, yellow, black, grey necrotic tissue, fluctuant, able to probe one area, fearful much deeper than it appears  Drainage (amount, consistency, odor) copious, foul, purulent Periwound: erythematous Dressing procedure/placement/frequency: Pending surgery evaluation, WOC nurse contacted hospitalist to recommend surgical evaluation. Add enzymatic debridement agent, cover with moist gauze, secure with tape. Change daily. Will ask ED staff to hold off on any dressing changes until surgery evaluate patient.  Currently it is malodorous and bleeds from the sides where skin has been denuded.      Lab Results:   Recent Labs  01/03/17 0515 01/05/17 0509  WBC 11.9* 10.5  HGB 10.7* 8.9*  HCT 35.5* 28.9*  PLT 241 212    BMET  Recent Labs  01/03/17 0515 01/05/17 0509  NA 149* 143  K 3.8 3.5  CL 112* 108  CO2 29 28  GLUCOSE 119* 114*  BUN 28* 28*  CREATININE 1.16 1.19  CALCIUM 9.5 9.3   PT/INR No results for input(s): LABPROT, INR in the last 72 hours.   Recent Labs Lab 12/29/16 1916 01/01/17 0518    AST 31 23  ALT 23 20  ALKPHOS 200* 171*  BILITOT 1.5* 1.5*  PROT 6.4* 6.3*  ALBUMIN 2.3* 1.9*     Lipase     Component Value Date/Time   LIPASE 27 12/29/2016 1916     Studies/Results: Mr Brain Wo Contrast  Result Date: 01/04/2017 CLINICAL DATA:  Initial evaluation for acute encephalopathy, confusion, elevated white blood cell count. EXAM: MRI HEAD WITHOUT CONTRAST TECHNIQUE: Multiplanar, multiecho pulse sequences of the brain and surrounding structures were obtained without intravenous contrast. COMPARISON:  None available. FINDINGS: Brain: Study significantly degraded by motion artifact. Diffuse prominence of the CSF containing spaces is compatible with generalized age related cerebral atrophy. Mild patchy T2/FLAIR hyperintensity within the periventricular and deep white matter both cerebral hemispheres most consistent with chronic small vessel ischemic disease. No abnormal foci of restricted diffusion to suggest acute or subacute ischemia. Gray-white matter differentiation maintained. No evidence for acute or chronic intracranial hemorrhage. No areas of chronic infarction identified. No mass lesion, midline shift or mass effect. Mild ventricular prominence related to global parenchymal volume loss of hydrocephalus. No extra-axial fluid collection. Major dural sinuses are grossly patent. Pituitary gland not well evaluated on this motion degraded study. Vascular: Major intracranial vascular flow voids maintained. Skull and upper cervical spine: Craniocervical junction grossly unremarkable. No obvious abnormality within the upper cervical spine. Bone marrow signal intensity within normal limits. No scalp soft tissue abnormality. Sinuses/Orbits: Globes and orbital soft tissues grossly unremarkable. No significant paranasal sinus disease identified on this motion degraded study. No  obvious mastoid effusion. IMPRESSION: 1. Motion degraded study. 2. No acute intracranial infarct or other process  identified. 3. Generalized age-related cerebral atrophy with mild chronic small vessel ischemic disease. Electronically Signed   By: Rise Mu M.D.   On: 01/04/2017 22:11   Prior to Admission medications   Medication Sig Start Date End Date Taking? Authorizing Provider  acetaminophen (TYLENOL) 500 MG tablet Take 500 mg by mouth every 8 (eight) hours as needed for moderate pain (Pain score 4-6/10).   Yes Historical Provider, MD  Amino Acids-Protein Hydrolys (FEEDING SUPPLEMENT, PRO-STAT SUGAR FREE 64,) LIQD Take 30 mLs by mouth 2 (two) times daily. 11/28/16  Yes Joseph Art, DO  apixaban (ELIQUIS) 5 MG TABS tablet Take 1 tablet (5 mg total) by mouth 2 (two) times daily. 07/18/15  Yes Shanker Levora Dredge, MD  atorvastatin (LIPITOR) 80 MG tablet Take 1 tablet (80 mg total) by mouth daily. 07/18/15  Yes Shanker Levora Dredge, MD  carvedilol (COREG) 6.25 MG tablet Take 1 tablet (6.25 mg total) by mouth 2 (two) times daily with a meal. 07/18/15  Yes Shanker Levora Dredge, MD  divalproex (DEPAKOTE ER) 500 MG 24 hr tablet Take 500 mg by mouth at bedtime.   Yes Historical Provider, MD  HYDROcodone-acetaminophen (NORCO/VICODIN) 5-325 MG tablet Take 1 tablet by mouth. Give 1 tablet scheduled at 9AM and 1 tablet q6h prn for severe pain scale of 7/10.   Yes Historical Provider, MD  hydrOXYzine (ATARAX/VISTARIL) 25 MG tablet Take 1 tablet (25 mg total) by mouth 3 (three) times daily as needed (for itching). Patient taking differently: Take 25 mg by mouth every 8 (eight) hours as needed for itching (for itching).  11/28/16  Yes Joseph Art, DO  Multiple Vitamins-Minerals (DECUBI-VITE PO) Take 1 capsule by mouth daily.   Yes Historical Provider, MD  pantoprazole (PROTONIX) 40 MG tablet Take 1 tablet (40 mg total) by mouth 2 (two) times daily. 11/28/16  Yes Joseph Art, DO  QUEtiapine (SEROQUEL) 25 MG tablet Take 25 mg by mouth 2 (two) times daily.   Yes Historical Provider, MD  saccharomyces boulardii  (FLORASTOR) 250 MG capsule Take 250 mg by mouth 2 (two) times daily. x13 days 12/18/16  Yes Historical Provider, MD  spironolactone (ALDACTONE) 25 MG tablet Take 1 tablet (25 mg total) by mouth daily. Start 8/11 07/19/15  Yes Shanker Levora Dredge, MD  torsemide (DEMADEX) 100 MG tablet Take 1 tablet (100 mg total) by mouth 2 (two) times daily. 11/28/16  Yes Joseph Art, DO  divalproex (DEPAKOTE SPRINKLE) 125 MG capsule Take 125 mg by mouth 2 (two) times daily. 12/18/16 12/20/16  Historical Provider, MD     Medications: . atorvastatin  80 mg Oral q1800  . carvedilol  6.25 mg Oral BID WC  . [START ON 01/06/2017] Chlorhexidine Gluconate Cloth  6 each Topical Q0600  . divalproex  500 mg Oral QHS  . feeding supplement (ENSURE ENLIVE)  237 mL Oral BID BM  . feeding supplement (PRO-STAT SUGAR FREE 64)  30 mL Oral Daily  . mupirocin ointment  1 application Nasal BID  . pantoprazole  40 mg Oral BID  . piperacillin-tazobactam (ZOSYN)  IV  3.375 g Intravenous Q8H  . QUEtiapine  12.5 mg Oral BID  . saccharomyces boulardii  250 mg Oral BID  . thiamine  100 mg Oral Daily  . vancomycin  1,000 mg Intravenous Q24H  No IV fluids   Assessment/Plan Unstageable decubitus AF on Eliquis Acute delirium Acute on  chronic kidney injury Chronic diastolic CHF Hepatic and pancreatic lesions - MRI ordered  Chronic venous insuffiencey  Probable malnutrition - prealbumin pending FEN: Cardiac diet ID: [id number] &Vancomycin 12/29/16 =>> day 8 DVT:  Una boots, no compression - last dose of Eliquis was 12/31/16 Plan:  This is my first time seeing this.  I will get hydrotherapy to see and start irrigating this.  He is getting BID dressing changes wet to dry with Kerlix. I doubt we can heal this, especially with him bed ridden and so confused.  Will follow along with you.   LOS: 6 days    Leith Hedlund 01/05/2017 [phone]

## 2017-01-05 NOTE — Progress Notes (Signed)
SLP Cancellation Note  Patient Details Name: [name] M Chirico MRN: [id number] DOB: 10-Aug-1946   Cancelled treatment:        Pt NPO for MRI. Will continue efforts.   Royce MacadamiaLitaker, Jodine Muchmore Willis 01/05/2017, 11:58 AM   Breck CoonsLisa Willis Lonell FaceLitaker M.Ed [employer]P Pager [phone]

## 2017-01-05 NOTE — Procedures (Signed)
ELECTROENCEPHALOGRAM REPORT  Date of Study: 01/05/2017  Patient's Name: Jesus [name] MRN: [id number] Date of Birth: 05-Feb-1946  Referring Provider: Dr. Osvaldo ShipperGokul Krishnan  Clinical History: This is a 71 year old man with altered mental status.  Medications: Tylenol Lipitor Coreg Depakote Vicodin Iilaudid Atarax Protonix Zosyn Seroquel Systems developerlorastor Vancocin  Technical Summary: A multichannel digital EEG recording measured by the international 10-20 system with electrodes applied with paste and impedances below 5000 ohms performed as portable with EKG monitoring in an predominantly drowsy and asleep patient.  Hyperventilation and photic stimulation were not performed.  The digital EEG was referentially recorded, reformatted, and digitally filtered in a variety of bipolar and referential montages for optimal display.   Description: The patient is predominantly drowsy and asleep during the recording.  During brief period of wakefulness, he is noted to be confused. There is no clear posterior dominant rhythm. The background consists of a large amount of diffuse 4-5 Hz theta and 2-3 Hz delta slowing.  During drowsiness and sleep, there is an increase in theta and delta slowing of the background, with poorly formed vertex waves occasionally seen.Hyperventilation and photic stimulation were not performed.  There were no epileptiform discharges or electrographic seizures seen.    EKG lead showed irregular rhythm.  Impression: This predominantly drowsy and asleep EEG is abnormal due to moderate diffuse slowing of the background.  Clinical Correlation of the above findings indicates diffuse cerebral dysfunction that is non-specific in etiology and can be seen with hypoxic/ischemic injury, toxic/metabolic encephalopathies, neurodegenerative disorders, or medication effect.  The absence of epileptiform discharges does not rule out a clinical diagnosis of epilepsy.  Clinical correlation is  advised.   Patrcia DollyKaren Kmari Brian, M.D.

## 2017-01-05 NOTE — Progress Notes (Signed)
This nurse call pharmacy regarding Vancomycin dose for 2000 d/t trough this am being 36. Pharmacist said it was ok to give and it also confirms this in the notes.   Jesus Dayshristy M Ameli Sangiovanni, RN

## 2017-01-05 NOTE — Progress Notes (Signed)
Called and spoke with MRI checking on status and was told that pt could eat supper and they would get to pt after stat orders were completed.   Larey Dayshristy M Lucciano Vitali, RN

## 2017-01-05 NOTE — Progress Notes (Signed)
Patient was unable to take morning medications D/T being NPO for MRI of abdomen per MRI request as well as not being alert enough. MRI was called and they stated to allow him to have lunch, but make NPO at 1700 this afternoon. Will continue to monitor.

## 2017-01-05 NOTE — Progress Notes (Signed)
EEG Completed; Results Pending  

## 2017-01-05 NOTE — Consult Note (Signed)
WOC reviewed chart and surgical notes from Friday.  Patient's sacral wound is now managed by CCS team.  WOC will remain available if needed.  WOC will continue to follow along with team for 4 layer compression wrap changes on each Wednesday.  [facility] on Wednesday.  Eryanna Regal [facility] MSN,RN,CWOCN, CNS

## 2017-01-05 NOTE — Progress Notes (Signed)
Rito EhrlichKrishnan, MD stated to give pain meds prior to MRI. Orders followed. Will continue to monitor.

## 2017-01-05 NOTE — Progress Notes (Signed)
Pharmacy Antibiotic Note  Jesus [name] is a 71 y.o. male admitted on 12/29/2016 with sacral wound infection.  Pharmacy has been consulted for Vancomycin & Zosyn dosing.   The patient's renal function has been stable over the last 48-72 hours, SCr 1.16, CrCl~60-65 ml/min (normalized). S/p I&D on 1/26 where wound was tracked down to sacral bone, but was debrided to healthy tissue. Per discussion with Dr. Rito EhrlichKrishnan on 1/28, who talked with surgery, plan is to continue current abx for the time being.  Vancomycin trough this AM = 36.  Lab was drawn correctly and AM dose was not given.  I calculated new dosing regimen of 1gm IV q24h with estimated trough of 18.  Plan: 1. Change Vancomycin to 1g IV every 24 hours. Next dose due at 8pm 1/29. 2. Continue Zosyn 3.375g IV every 8 hours (infused over 4 hours) 3. Will continue to follow renal function, culture results, LOT, and antibiotic de-escalation plans   Height: 5\' 10"  (177.8 cm) Weight: (!) 300 lb 11.3 oz (136.4 kg) IBW/kg (Calculated) : 73  Temp (24hrs), Avg:98.4 F (36.9 C), Min:98 F (36.7 C), Max:98.8 F (37.1 C)   Recent Labs Lab 12/29/16 1916 12/29/16 1928 12/30/16 0410 12/30/16 0618 01/01/17 0518 01/02/17 0559 01/03/17 0515 01/05/17 0509 01/05/17 0714  WBC 13.8*  --   --   --  10.4 13.2* 11.9* 10.5  --   CREATININE 1.63*  --   --   --  1.11 1.16 1.16 1.19  --   LATICACIDVEN  --  1.65 1.1 1.0  --   --   --   --   --   VANCOTROUGH  --   --   --   --   --   --   --   --  36*    Estimated Creatinine Clearance: 80.4 mL/min (by C-G formula based on SCr of 1.19 mg/dL).    Allergies  Allergen Reactions  . Amlodipine Besylate Swelling and Other (See Comments)    "started off low and ended up severe; if, in fact, that is what's causing the swelling" (06/03/12)  . Levaquin [Levofloxacin In D5w] Rash   Vanc 1/22>> Zosyn 1/23>>  Dose adjustments this admission:  1/29 AM VT = 36 (drawn correctly) - dose adjusted to 1gm IV  q24h  Microbiology results:  1/22 BCx: no growth F 1/26 MRSA PCR >> positive  Thank you for allowing pharmacy to be a part of this patient's care.  Toys 'R' UsKimberly Maliyah Willets, Pharm.D., BCPS Clinical Pharmacist Pager [phone] 01/05/2017 10:26 AM

## 2017-01-06 ENCOUNTER — Inpatient Hospital Stay (HOSPITAL_COMMUNITY): Payer: Medicare HMO

## 2017-01-06 LAB — BASIC METABOLIC PANEL
Anion gap: 9 (ref 5–15)
BUN: 26 mg/dL — ABNORMAL HIGH (ref 6–20)
CALCIUM: 9.1 mg/dL (ref 8.9–10.3)
CO2: 28 mmol/L (ref 22–32)
CREATININE: 1.21 mg/dL (ref 0.61–1.24)
Chloride: 105 mmol/L (ref 101–111)
GFR, EST NON AFRICAN AMERICAN: 59 mL/min — AB (ref >60)
Glucose, Bld: 128 mg/dL — ABNORMAL HIGH (ref 65–99)
Potassium: 3.6 mmol/L (ref 3.5–5.1)
SODIUM: 142 mmol/L (ref 135–145)

## 2017-01-06 LAB — CBC
HCT: 29.2 % — ABNORMAL LOW (ref 39.0–52.0)
Hemoglobin: 8.9 g/dL — ABNORMAL LOW (ref 13.0–17.0)
MCH: 27 pg (ref 26.0–34.0)
MCHC: 30.5 g/dL (ref 30.0–36.0)
MCV: 88.5 fL (ref 78.0–100.0)
Platelets: 219 10*3/uL (ref 150–400)
RBC: 3.3 MIL/uL — ABNORMAL LOW (ref 4.22–5.81)
RDW: 16.7 % — AB (ref 11.5–15.5)
WBC: 13.1 10*3/uL — ABNORMAL HIGH (ref 4.0–10.5)

## 2017-01-06 LAB — PREALBUMIN: Prealbumin: 5.3 mg/dL — ABNORMAL LOW (ref 18–38)

## 2017-01-06 MED ORDER — DAKINS (1/4 STRENGTH) 0.125 % EX SOLN
Freq: Two times a day (BID) | CUTANEOUS | Status: DC
  Administered 2017-01-06 – 2017-01-07 (×2)
  Administered 2017-01-07: 1
  Filled 2017-01-06: qty 473

## 2017-01-06 MED ORDER — SODIUM CHLORIDE 0.45 % IV SOLN
INTRAVENOUS | Status: DC
  Administered 2017-01-06 – 2017-01-07 (×2): via INTRAVENOUS

## 2017-01-06 MED ORDER — ORAL CARE MOUTH RINSE
15.0000 mL | Freq: Two times a day (BID) | OROMUCOSAL | Status: DC
  Administered 2017-01-06 – 2017-01-23 (×33): 15 mL via OROMUCOSAL

## 2017-01-06 NOTE — Progress Notes (Signed)
[facility] PROGRESS NOTE  Jesus [name]:[id number] DOB: 1946-02-18 DOA: 12/29/2016  PCP: Miki Kins  Brief History/Interval Summary: Jesus [name] Prevetteis a 71 y.o.malewith medical history significant for A.Fib, HLD, HTN, sacral decubitus ulcer. Patient presented to the ED via EMS from [facility] with c/o confusion and elevated WBC on routine labs. Concern was for infection in the sacral decubitus. Patient was hospitalized for management. Patient was placed on broad-spectrum antibiotics. Anticoagulation was held. Patient underwent surgical debridement on 1/26. Patient also noted to have hepatic and pancreatic lesions noted incidentally on CT scan. MRI abdomen is pending. Patient, however, is not progressing. Son agreeable to palliative medicine consult.  Reason for Visit: Infected sacral decubitus  Consultants: Gen. surgery. Palliative medicine.  Procedures: Debridement of decubitus ulcer, 1/26  EEG Impression: This predominantly drowsy and asleep EEG is abnormal due to moderate diffuse slowing of the background.  Antibiotics: Vancomycin and Zosyn  Subjective/Interval History: Patient remains confused. Arousable. Denies any complaints. He does not mention any pain.   ROS: Unable to do due to confusion  Objective:  Vital Signs  Vitals:   01/05/17 1835 01/05/17 2032 01/06/17 0546 01/06/17 0952  BP: 121/85 (!) 124/47 138/62 (!) 125/55  Pulse: 88 81 78 80  Resp: 16 (!) 21 20 20   Temp: 98.7 F (37.1 C) 98.6 F (37 C) 97.7 F (36.5 C) 98 F (36.7 C)  TempSrc: Oral     SpO2: 99% 97% 97% 98%  Weight:  119.3 kg (263 lb)    Height:        Intake/Output Summary (Last 24 hours) at 01/06/17 0956 Last data filed at 01/06/17 8119  Gross per 24 hour  Intake             1300 ml  Output              775 ml  Net              525 ml   Filed Weights   12/29/16 1827 01/01/17 2027 01/05/17 2032  Weight: 136.1 kg (300 lb) (!) 136.4 kg (300 lb  11.3 oz) 119.3 kg (263 lb)    General appearance: Drowsy but arousable Resp: clear to auscultation bilaterally Cardio: regular rate and rhythm, S1, S2 normal, no murmur, click, rub or gallop GI: soft, non-tender; bowel sounds normal; no masses,  no organomegaly Neurologic: Remains confused. Moving all his extremities. No obvious neurological deficit is noted.  Lab Results:  Data Reviewed: I have personally reviewed following labs and imaging studies  CBC:  Recent Labs Lab 01/01/17 0518 01/02/17 0559 01/03/17 0515 01/05/17 0509 01/06/17 0652  WBC 10.4 13.2* 11.9* 10.5 13.1*  HGB 11.5* 12.0* 10.7* 8.9* 8.9*  HCT 37.3* 39.9 35.5* 28.9* 29.2*  MCV 88.6 89.7 90.1 88.4 88.5  PLT 287 287 241 212 219    Basic Metabolic Panel:  Recent Labs Lab 01/01/17 0518 01/02/17 0559 01/03/17 0515 01/05/17 0509 01/06/17 0652  NA 154* 151* 149* 143 142  K 3.6 4.0 3.8 3.5 3.6  CL 115* 111 112* 108 105  CO2 30 30 29 28 28   GLUCOSE 115* 118* 119* 114* 128*  BUN 39* 32* 28* 28* 26*  CREATININE 1.11 1.16 1.16 1.19 1.21  CALCIUM 9.9 10.1 9.5 9.3 9.1    GFR: Estimated Creatinine Clearance: 73.5 mL/min (by C-G formula based on SCr of 1.21 mg/dL).  Liver Function Tests:  Recent Labs Lab 01/01/17 0518  AST 23  ALT 20  ALKPHOS 171*  BILITOT 1.5*  PROT 6.3*  ALBUMIN 1.9*     Recent Results (from the past 240 hour(s))  Culture, blood (routine x 2)     Status: None   Collection Time: 12/29/16  7:16 PM  Result Value Ref Range Status   Specimen Description BLOOD RIGHT ANTECUBITAL  Final   Special Requests BOTTLES DRAWN AEROBIC AND ANAEROBIC 5CC  Final   Culture NO GROWTH 5 DAYS  Final   Report Status 01/03/2017 FINAL  Final  Culture, blood (routine x 2)     Status: None   Collection Time: 12/29/16  8:59 PM  Result Value Ref Range Status   Specimen Description BLOOD RIGHT HAND  Final   Special Requests IN PEDIATRIC BOTTLE 4CC  Final   Culture NO GROWTH 5 DAYS  Final   Report  Status 01/03/2017 FINAL  Final  Surgical PCR screen     Status: Abnormal   Collection Time: 01/02/17  8:10 AM  Result Value Ref Range Status   MRSA, PCR POSITIVE (A) NEGATIVE Final    Comment: RESULT CALLED TO, READ BACK BY AND VERIFIED WITH: C. Peeler NT 11:05 01/02/17 ([email or web address])    Staphylococcus aureus POSITIVE (A) NEGATIVE Final    Comment:        The Xpert SA Assay (FDA approved for NASAL specimens in patients over 37 years of age), is one component of a comprehensive surveillance program.  Test performance has been validated by [facility] for patients greater than or equal to 80 year old. It is not intended to diagnose infection nor to guide or monitor treatment.       Radiology Studies: Mr Brain 64 Contrast  Result Date: 01/04/2017 CLINICAL DATA:  Initial evaluation for acute encephalopathy, confusion, elevated white blood cell count. EXAM: MRI HEAD WITHOUT CONTRAST TECHNIQUE: Multiplanar, multiecho pulse sequences of the brain and surrounding structures were obtained without intravenous contrast. COMPARISON:  None available. FINDINGS: Brain: Study significantly degraded by motion artifact. Diffuse prominence of the CSF containing spaces is compatible with generalized age related cerebral atrophy. Mild patchy T2/FLAIR hyperintensity within the periventricular and deep white matter both cerebral hemispheres most consistent with chronic small vessel ischemic disease. No abnormal foci of restricted diffusion to suggest acute or subacute ischemia. Gray-white matter differentiation maintained. No evidence for acute or chronic intracranial hemorrhage. No areas of chronic infarction identified. No mass lesion, midline shift or mass effect. Mild ventricular prominence related to global parenchymal volume loss of hydrocephalus. No extra-axial fluid collection. Major dural sinuses are grossly patent. Pituitary gland not well evaluated on this motion degraded study. Vascular: Major  intracranial vascular flow voids maintained. Skull and upper cervical spine: Craniocervical junction grossly unremarkable. No obvious abnormality within the upper cervical spine. Bone marrow signal intensity within normal limits. No scalp soft tissue abnormality. Sinuses/Orbits: Globes and orbital soft tissues grossly unremarkable. No significant paranasal sinus disease identified on this motion degraded study. No obvious mastoid effusion. IMPRESSION: 1. Motion degraded study. 2. No acute intracranial infarct or other process identified. 3. Generalized age-related cerebral atrophy with mild chronic small vessel ischemic disease. Electronically Signed   By: Rise Mu M.D.   On: 01/04/2017 22:11     Medications:  Scheduled: . atorvastatin  80 mg Oral q1800  . carvedilol  6.25 mg Oral BID WC  . Chlorhexidine Gluconate Cloth  6 each Topical Q0600  . divalproex  500 mg Oral QHS  . feeding supplement (ENSURE ENLIVE)  237 mL Oral BID BM  . feeding supplement (PRO-STAT  SUGAR FREE 64)  30 mL Oral Daily  . LORazepam  1 mg Intravenous Once  . mupirocin ointment  1 application Nasal BID  . pantoprazole  40 mg Oral BID  . piperacillin-tazobactam (ZOSYN)  IV  3.375 g Intravenous Q8H  . QUEtiapine  12.5 mg Oral BID  . saccharomyces boulardii  250 mg Oral BID  . sodium hypochlorite   Irrigation Q12H  . thiamine  100 mg Oral Daily  . vancomycin  1,000 mg Intravenous Q24H   Continuous:  GNF:[id number]:acetaminophen, HYDROcodone-acetaminophen, HYDROmorphone (DILAUDID) injection, hydrOXYzine  Assessment/Plan:  Principal Problem:   Decubitus ulcer of sacral region, unstageable [facility](HCC) Active Problems:   Atrial fibrillation (HCC)   Chronic venous insufficiency   Chronic diastolic CHF (congestive heart failure) (HCC)   Acute on chronic renal failure (HCC)   Delirium    Infected Decubitus ulcer of sacral region Large sacral decubitus ulcer, unstageable with concerns for infection. CT scan did show some  air pockets without any evidence for abscess. Wound care was consulted. Subsequently, general surgery was consulted. Patient's anticoagulation was held. Patient underwent surgical debridement 1/26. Patient was placed on vancomycin and Zosyn, which is being continued. His WBC is noted to be slightly higher today. General surgery to re-examine the wound. They will make recommendations regarding anticoagulation as well as antibiotics once they have seen the patient.  Acute encephalopathy in the setting of questionable dementia Some of this is due to baseline cognitive impairment. According to patient's son, patient has had issues with memory for a few months. Patient was noted to be on Aricept a few months ago. Currently on Depakote and Seroquel. Mental status has been stable. Workup done here included B-12 level which was normal. TSH normal. RPR and HIV are nonreactive. EEG did not show any epileptiform activity. MRI brain was limited due to motion, however, did not show any acute findings. Atrophy was present. Continue thiamine considering history of alcohol use in the past. Depakote level is nontoxic. Patient's verbalization of wanting to hurt himself, was most likely secondary to pain. Seen by psychiatry. Appreciate their input. No further intervention needed.   A.Fib  CHA2DS2-VASc score is 4. Rate is controlled with Coreg. Patient was on Eliquis, which has been held for surgery. Resume when okay with surgeon. There was some bleeding noted in the decubitus when examined by general surgeon on 1/27. Surgery to reexamine wound today and will let us know about resuming anticoagulation. Hemoglobin noted to have dropped some, but stable today.  Normocytic anemia  Hemoglobin noted to have drifted down, but stable. No overt bleeding has been noted. Bleeding noted from his wound.  Likely mild AKI on CKD stage 3/hypernatremia Creatinine baseline 1.4, presented with creatinine 1.6 very close to baseline.  Diuretics held, given IV fluids. Creatinine has improved. Patient was noted to be hypernatremic. IV fluids were stopped. Sodium level has improved.    Chronic diastolic CHF  Holding lasix and spironolactone for AKI. Seems euvolemic.  Hepatic and pancreatic lesions Cystic lesions incidentally noted on CT scan. MRI abdomen is pending. Plan is for this to be done today. He will need to be given pain medicines prior to MRI so that he remains still.  Chronic venous insufficiency  Stable.  Patient also noted to have generalized deconditioning. His quality of life appears to be poor. His activity level and functional status is not known, but likely poor considering presence of decubitus ulcer. Patient may benefit from palliative medicine input for goals of care. Discussed in detail  with patient's son today. Even if the MRI abdomen does not show any concerning findings, patient's general medical status and health has been declining. He has poor quality of life. He is agreeable to talk to palliative medicine for goals of care. Patient has other children including 2daughters and sister.  DVT Prophylaxis: Eliquis on hold. SCDs.    Code Status: Full code  Family Communication: Discussed with his son 1/30 Disposition Plan: Management as mentioned above. MRI abdomen pending. Palliative medicine consulted.    LOS: 7 days   [facility]  [facility] Pager [phone] 01/06/2017, 9:56 AM  If 7PM-7AM, please contact night-coverage at [email or web address], password [facility]

## 2017-01-06 NOTE — Progress Notes (Signed)
Physical Therapy Wound Treatment Patient Details  Name: Jesus [name] MRN: [id number] Date of Birth: Jan 03, 1946  Today's Date: 01/06/2017 Time: 0388-8280 Time Calculation (min): 62 min  Subjective  Subjective: Lethargic throughout session Patient and Family Stated Goals: Pt did not state goals during session Date of Onset:  (Chronic) Prior Treatments: I&D 01/02/17  Pain Score: Pt was premedicated prior to session  Wound Assessment  Pressure Injury 12/29/16 Unstageable - Full thickness tissue loss in which the base of the ulcer is covered by slough (yellow, tan, gray, green or brown) and/or eschar (tan, brown or black) in the wound bed. (Active)  Dressing Type ABD;Barrier Film (skin prep);Moist to dry;Gauze (Comment) 01/06/2017  3:22 PM  Dressing Changed 01/06/2017  3:22 PM  Dressing Change Frequency Daily 01/06/2017  3:22 PM  State of Healing Early/partial granulation 01/06/2017  3:22 PM  Site / Wound Assessment Red;Yellow;Bleeding;Black 01/06/2017  3:22 PM  % Wound base Red or Granulating 40% 01/06/2017  3:22 PM  % Wound base Yellow/Fibrinous Exudate 40% 01/06/2017  3:22 PM  % Wound base Black/Eschar 20% 01/06/2017  3:22 PM  % Wound base Other/Granulation Tissue (Comment) 0% 01/06/2017  3:22 PM  Peri-wound Assessment Maceration;Induration 01/06/2017  3:22 PM  Wound Length (cm) 11 cm 01/06/2017  3:22 PM  Wound Width (cm) 15 cm 01/06/2017  3:22 PM  Wound Depth (cm) 6 cm 01/06/2017  3:22 PM  Drainage Amount Moderate 01/06/2017  3:22 PM  Drainage Description Purulent;Serosanguineous 01/06/2017  3:22 PM  Treatment Debridement (Selective);Hydrotherapy (Pulse lavage);Packing (Saline gauze) 01/06/2017  3:22 PM   Dakins moistened gauze   Hydrotherapy Pulsed lavage therapy - wound location: Sacrum Pulsed Lavage with Suction (psi): 12 psi Pulsed Lavage with Suction - Normal Saline Used: 1000 mL Pulsed Lavage Tip: Tip with splash shield Selective Debridement Selective Debridement - Location:  Sacrum Selective Debridement - Tools Used: Forceps;Scissors Selective Debridement - Tissue Removed: Yellow and black necrotic tissue   Wound Assessment and Plan  Wound Therapy - Assess/Plan/Recommendations Wound Therapy - Clinical Statement: Pt presents to hydrotherapy with chronic sacral wound s/p I&D on 01/02/17. Pt will benefit from continued hydrotherapy for selective removal of unviable tissue and to decrease bioburden. Wound Therapy - Functional Problem List: Decreased tolerance for OOB, bed mobility; acute pain Factors Delaying/Impairing Wound Healing: Altered sensation;Immobility;Multiple medical problems;Vascular compromise Hydrotherapy Plan: Debridement;Dressing change;Patient/family education;Pulsatile lavage with suction Wound Therapy - Frequency: 6X / week Wound Therapy - Follow Up Recommendations: Skilled nursing facility Wound Plan: See above  Wound Therapy Goals- Improve the function of patient's integumentary system by progressing the wound(s) through the phases of wound healing (inflammation - proliferation - remodeling) by: Decrease Necrotic Tissue to: 25% Decrease Necrotic Tissue - Progress: Goal set today Increase Granulation Tissue to: 75% Increase Granulation Tissue - Progress: Goal set today Goals/treatment plan/discharge plan were made with and agreed upon by patient/family: Yes Time For Goal Achievement: 7 days Wound Therapy - Potential for Goals: Good  Goals will be updated until maximal potential achieved or discharge criteria met.  Discharge criteria: when goals achieved, discharge from hospital, MD decision/surgical intervention, no progress towards goals, refusal/missing three consecutive treatments without notification or medical reason.  GP     Thelma Comp 01/06/2017, 3:37 PM   Rolinda Roan, PT, DPT Acute Rehabilitation Services Pager: [phone]

## 2017-01-06 NOTE — Progress Notes (Signed)
SLP Cancellation Note  Patient Details Name: [name] M Kowalke MRN: [id number] DOB: 12/03/1946   Cancelled treatment:        Attempted to see pt for swallow safety and tolerance however he is NPO for MRI (which was unable to be completed yesterday).   Royce MacadamiaLitaker, Briani Maul Willis 01/06/2017, 12:09 PM  Breck CoonsLisa Willis Lonell FaceLitaker M.Ed [employer]P Pager [phone]

## 2017-01-06 NOTE — Progress Notes (Signed)
4 Days Post-Op  Subjective: [name] has been thru and completed therapy, and redressed.  I will try to look at it again tomorrow.  Objective: Vital signs in last 24 hours: Temp:  [97.7 F (36.5 C)-98.7 F (37.1 C)] 98 F (36.7 C) (01/30 0952) Pulse Rate:  [78-88] 80 (01/30 0952) Resp:  [16-21] 20 (01/30 0952) BP: (121-138)/(47-85) 125/55 (01/30 0952) SpO2:  [97 %-99 %] 98 % (01/30 0952) Weight:  [119.3 kg (263 lb)] 119.3 kg (263 lb) (01/29 2032) Last BM Date: 01/05/17 600 PO 700 IV recorded bm x 1 Afebrile, VSS BMP OK  prealbumin 5.3 WBC 13.1 Nasal swabs +MRSA- Nothing cultured that is positive   Intake/Output from previous day: 01/29 0701 - 01/30 0700 In: 1300 [P.O.:600; IV Piggyback:700] Out: 775 [Urine:775] Intake/Output this shift: No intake/output data recorded.  Not examined  Lab Results:   Recent Labs  01/05/17 0509 01/06/17 0652  WBC 10.5 13.1*  HGB 8.9* 8.9*  HCT 28.9* 29.2*  PLT 212 219    BMET  Recent Labs  01/05/17 0509 01/06/17 0652  NA 143 142  K 3.5 3.6  CL 108 105  CO2 28 28  GLUCOSE 114* 128*  BUN 28* 26*  CREATININE 1.19 1.21  CALCIUM 9.3 9.1   PT/INR No results for input(s): LABPROT, INR in the last 72 hours.   Recent Labs Lab 01/01/17 0518  AST 23  ALT 20  ALKPHOS 171*  BILITOT 1.5*  PROT 6.3*  ALBUMIN 1.9*     Lipase     Component Value Date/Time   LIPASE 27 12/29/2016 1916     Studies/Results: Mr Brain Wo Contrast  Result Date: 01/04/2017 CLINICAL DATA:  Initial evaluation for acute encephalopathy, confusion, elevated white blood cell count. EXAM: MRI HEAD WITHOUT CONTRAST TECHNIQUE: Multiplanar, multiecho pulse sequences of the brain and surrounding structures were obtained without intravenous contrast. COMPARISON:  None available. FINDINGS: Brain: Study significantly degraded by motion artifact. Diffuse prominence of the CSF containing spaces is compatible with generalized age related cerebral atrophy. Mild  patchy T2/FLAIR hyperintensity within the periventricular and deep white matter both cerebral hemispheres most consistent with chronic small vessel ischemic disease. No abnormal foci of restricted diffusion to suggest acute or subacute ischemia. Gray-white matter differentiation maintained. No evidence for acute or chronic intracranial hemorrhage. No areas of chronic infarction identified. No mass lesion, midline shift or mass effect. Mild ventricular prominence related to global parenchymal volume loss of hydrocephalus. No extra-axial fluid collection. Major dural sinuses are grossly patent. Pituitary gland not well evaluated on this motion degraded study. Vascular: Major intracranial vascular flow voids maintained. Skull and upper cervical spine: Craniocervical junction grossly unremarkable. No obvious abnormality within the upper cervical spine. Bone marrow signal intensity within normal limits. No scalp soft tissue abnormality. Sinuses/Orbits: Globes and orbital soft tissues grossly unremarkable. No significant paranasal sinus disease identified on this motion degraded study. No obvious mastoid effusion. IMPRESSION: 1. Motion degraded study. 2. No acute intracranial infarct or other process identified. 3. Generalized age-related cerebral atrophy with mild chronic small vessel ischemic disease. Electronically Signed   By: Rise MuBenjamin  McClintock M.D.   On: 01/04/2017 22:11    Medications: . atorvastatin  80 mg Oral q1800  . carvedilol  6.25 mg Oral BID WC  . Chlorhexidine Gluconate Cloth  6 each Topical Q0600  . divalproex  500 mg Oral QHS  . feeding supplement (ENSURE ENLIVE)  237 mL Oral BID BM  . feeding supplement (PRO-STAT SUGAR FREE 64)  30 mL  Oral Daily  . LORazepam  1 mg Intravenous Once  . mupirocin ointment  1 application Nasal BID  . pantoprazole  40 mg Oral BID  . piperacillin-tazobactam (ZOSYN)  IV  3.375 g Intravenous Q8H  . QUEtiapine  12.5 mg Oral BID  . saccharomyces boulardii  250 mg  Oral BID  . sodium hypochlorite   Irrigation Q12H  . thiamine  100 mg Oral Daily  . vancomycin  1,000 mg Intravenous Q24H    Assessment/Plan Unstageable decubitus S/p DEBRIDMENT OF DECUBITUS ULCER 11 x 14 x 6 cm, 01/02/17, Dr. Antonieta Pert  AF on Eliquis Acute delirium Acute on chronic kidney injury Chronic diastolic CHF Hepatic and pancreatic lesions - MRI ordered  Chronic venous insuffiencey  Probable malnutrition - prealbumin pending FEN: Cardiac diet ID: [id number] &Vancomycin 12/29/16 =>> day 8 DVT:  Una boots, no compression - last dose of Eliquis was 12/31/16  Dr. Rito Ehrlich is asking about the duration of antibiotics and about anticoagulation.  WBC is up some, Wound is open, no cultures from any source.  He did bleed freely from the site when we took the tape off yesterday.  There was also some clotted blood on the end of the wound.    I plan to look at wound again tomorrow with Wound care and [name].     LOS: 7 days    Jesus [name] 01/06/2017 [phone]

## 2017-01-07 LAB — BASIC METABOLIC PANEL
ANION GAP: 8 (ref 5–15)
BUN: 26 mg/dL — ABNORMAL HIGH (ref 6–20)
CO2: 29 mmol/L (ref 22–32)
Calcium: 9.1 mg/dL (ref 8.9–10.3)
Chloride: 105 mmol/L (ref 101–111)
Creatinine, Ser: 1.1 mg/dL (ref 0.61–1.24)
GFR calc non Af Amer: >60 mL/min (ref >60)
GLUCOSE: 108 mg/dL — AB (ref 65–99)
POTASSIUM: 3.7 mmol/L (ref 3.5–5.1)
Sodium: 142 mmol/L (ref 135–145)

## 2017-01-07 LAB — CBC
HEMATOCRIT: 28.4 % — AB (ref 39.0–52.0)
Hemoglobin: 8.6 g/dL — ABNORMAL LOW (ref 13.0–17.0)
MCH: 27 pg (ref 26.0–34.0)
MCHC: 30.3 g/dL (ref 30.0–36.0)
MCV: 89.3 fL (ref 78.0–100.0)
Platelets: 224 10*3/uL (ref 150–400)
RBC: 3.18 MIL/uL — AB (ref 4.22–5.81)
RDW: 16.9 % — AB (ref 11.5–15.5)
WBC: 10.7 10*3/uL — AB (ref 4.0–10.5)

## 2017-01-07 MED ORDER — POTASSIUM CHLORIDE CRYS ER 20 MEQ PO TBCR
40.0000 meq | EXTENDED_RELEASE_TABLET | Freq: Once | ORAL | Status: AC
Start: 2017-01-07 — End: 2017-01-07
  Administered 2017-01-07: 40 meq via ORAL
  Filled 2017-01-07: qty 2

## 2017-01-07 MED ORDER — DIVALPROEX SODIUM 125 MG PO CSDR
500.0000 mg | DELAYED_RELEASE_CAPSULE | Freq: Every day | ORAL | Status: DC
  Administered 2017-01-08 – 2017-01-23 (×16): 500 mg via ORAL
  Filled 2017-01-07 (×17): qty 4

## 2017-01-07 MED ORDER — TORSEMIDE 20 MG PO TABS
20.0000 mg | ORAL_TABLET | Freq: Every day | ORAL | Status: DC
  Administered 2017-01-07 – 2017-01-18 (×12): 20 mg via ORAL
  Filled 2017-01-07 (×12): qty 1

## 2017-01-07 NOTE — Progress Notes (Signed)
Physical Therapy Treatment Patient Details Name: Jesus [name] MRN: [id number] DOB: 30-Jun-1946 Today's Date: 01/07/2017    History of Present Illness  Jesus [name] is a 71 y.o. male with medical history significant of A.Fib, HLD, HTN, sacral decubitus ulcer.  Patient [facility] the ED [facility] with c/o confusion and elevated WBC today on routine labs.      PT Comments    Emphasis on bed mobility, warm up ROM exercise, transitions to sit, and sitting balance.  Pt too rigid and resistant to attempt standing today.   Follow Up Recommendations  SNF     Equipment Recommendations  Other (comment)    Recommendations for Other Services       Precautions / Restrictions Precautions Precautions: Fall    Mobility  Bed Mobility Overal bed mobility: Needs Assistance Bed Mobility: Supine to Sit;Sit to Supine     Supine to sit: +2 for physical assistance;Total assist Sit to supine: Total assist;+2 for physical assistance   General bed mobility comments: pt more resistant than helpful  Transfers                 General transfer comment: not able today  Ambulation/Gait                 Stairs            Wheelchair Mobility    Modified Rankin (Stroke Patients Only)       Balance Overall balance assessment: Needs assistance Sitting-balance support: Bilateral upper extremity supported;Feet supported Sitting balance-Leahy Scale: Poor Sitting balance - Comments: sat EOB working on upright sitting at EOB.  Pt tending to extend and slide off the EOB.  Unable to sit more than 5 min without blocking him from sliding forward.                            Cognition Arousal/Alertness: Awake/alert Behavior During Therapy: Anxious Overall Cognitive Status: Impaired/Different from baseline                      Exercises      General Comments General comments (skin integrity, edema, etc.): Assisted wound  nurse to donn PROFORE dressing.      Pertinent Vitals/Pain Pain Assessment: Faces Faces Pain Scale: Hurts even more Pain Location: knees, R Leg Pain Descriptors / Indicators: Moaning;Grimacing;Discomfort;Sore;Shooting    Home Living                      Prior Function            PT Goals (current goals can now be found in the care plan section) Acute Rehab PT Goals Patient Stated Goal: continue rehab at nursing home PT Goal Formulation: With patient Time For Goal Achievement: 01/19/17 Potential to Achieve Goals: Fair Progress towards PT goals: Not progressing toward goals - comment (pt not able to focus on task.)    Frequency    Min 3X/week      PT Plan Current plan remains appropriate    Co-evaluation             End of Session   Activity Tolerance: Patient tolerated treatment well Patient left: in bed;with call bell/phone within reach;with bed alarm set;with family/visitor present     Time: 1440-1510 PT Time Calculation (min) (ACUTE ONLY): 30 min  Charges:  $Therapeutic Activity: 23-37 mins  G CodesEliseo Gum Vaden Becherer 01/07/2017, 3:21 PM 01/07/2017  [place] Bing, PT [phone]  (pager)

## 2017-01-07 NOTE — Progress Notes (Signed)
5 Days Post-Op  Subjective: Very uncomfortable with any manipulation, afraid he is going to fall.  Objective: Vital signs in last 24 hours: Temp:  [97.7 F (36.5 C)-98.7 F (37.1 C)] 97.7 F (36.5 C) (01/31 0904) Pulse Rate:  [57-76] 74 (01/31 1020) Resp:  [18-20] 18 (01/31 0904) BP: (136-154)/(54-75) 146/65 (01/31 1020) SpO2:  [97 %-99 %] 97 % (01/31 0904) Weight:  [119.8 kg (264 lb 3.2 oz)] 119.8 kg (264 lb 3.2 oz) (01/30 2118) Last BM Date:  (UTA )  Intake/Output from previous day: 01/30 0701 - 01/31 0700 In: 147.5 [I.V.:47.5; IV Piggyback:100] Out: 975 [Urine:975] Intake/Output this shift: Total I/O In: 240 [P.O.:240] Out: 0   PE:    Deepest portions of the wound has allot of slough that should clean up with time and hydrotherapy.  The dermis  around the open site is as noted above.  He has lost all the epidermis along most of the edges, this is what bleeds easily.  The area around this is in danger and I have ask Wound care to see him and help with this. Xeroform over this area that all the dermis has been lost.  Wet to dry with Kerlix and saline within the open decubitus.  I will stop the Dakin's for now.     Lab Results:   Recent Labs  01/06/17 0652 01/07/17 0644  WBC 13.1* 10.7*  HGB 8.9* 8.6*  HCT 29.2* 28.4*  PLT 219 224    BMET  Recent Labs  01/06/17 0652 01/07/17 0644  NA 142 142  K 3.6 3.7  CL 105 105  CO2 28 29  GLUCOSE 128* 108*  BUN 26* 26*  CREATININE 1.21 1.10  CALCIUM 9.1 9.1   PT/INR No results for input(s): LABPROT, INR in the last 72 hours.   Recent Labs Lab 01/01/17 0518  AST 23  ALT 20  ALKPHOS 171*  BILITOT 1.5*  PROT 6.3*  ALBUMIN 1.9*     Lipase     Component Value Date/Time   LIPASE 27 12/29/2016 1916     Studies/Results: Mr Abdomen Wo Contrast  Result Date: 01/06/2017 CLINICAL DATA:  Evaluate liver and pancreas cysts. EXAM: MRI ABDOMEN WITHOUT CONTRAST TECHNIQUE: Multiplanar multisequence MR imaging was  performed without the administration of intravenous contrast. COMPARISON:  12/30/2016. FINDINGS: Exam detail is significantly diminished secondary to respiratory motion artifact. The post-contrast portion of the examination was not performed secondary to motion artifact. Lower chest: The heart size is enlarged. Aortic atherosclerosis noted. No pleural or pericardial effusion noted. Hepatobiliary: Relative hypertrophy of the caudate lobe of liver and there are several simple appearing cyst identified within the liver. The largest is in the lateral segment of left lobe adjacent to the falciform ligament. This measures 4.1 cm, image 19 of series 8. The gallbladder appears within normal limits. No gallbladder wall thickening. No intrahepatic bile duct dilatation. The common bile duct is normal in caliber. Pancreas: Significantly diminished exam detail secondary to motion artifact. There is a Uni locular cystic lesion within the head and uncinate process of the pancreas measuring 4.3 x 3.8 cm, image 28 of series 8. Within the limitation of unenhanced technique, there is no internal septation or soft tissue component identified. No solid mass, inflammatory changes or other parenchymal abnormality identified. Spleen: The spleen is enlarged measuring 13.7 cm in length. No focal splenic abnormality noted. Adrenals/Urinary Tract: Unremarkable appearance of the adrenal glands. No kidney mass or hydronephrosis identified. Stomach/Bowel: The stomach is normal. The small bowel  loops have a normal course and caliber without evidence for bowel obstruction. Vascular/Lymphatic: Aortic atherosclerosis. No aneurysm. No upper abdominal adenopathy. Other:  No ascites or focal fluid collections. Musculoskeletal: No suspicious bone lesions identified. IMPRESSION: 1. Significantly diminished exam detail secondary to motion artifact. Patient was not alert and unable to breath hold during the exam. 2. Morphologic features of liver compatible  with cirrhosis. 3. Splenomegaly. 4. Liver cysts. 5. It unilocular cystic structure within the head of pancreas is identified. Although incompletely characterized without IV contrast material there is no internal septation or solid mural component identified. If there is a history of pancreatitis this may represent a pseudocyst. Alternatively, findings may reflect a benign (or less likely malignant) cystic neoplasm of the pancreas. Followup examination in 6 months is advised to re- assess this lesion. At this time if the patient is unable to fully cooperate with the exam an adequately breath hold a pancreas protocol CT of the upper abdomen may provide more accurate results. Electronically Signed   By: Signa Kell M.D.   On: 01/06/2017 19:10    Medications: . atorvastatin  80 mg Oral q1800  . carvedilol  6.25 mg Oral BID WC  . Chlorhexidine Gluconate Cloth  6 each Topical Q0600  . divalproex  500 mg Oral QHS  . feeding supplement (ENSURE ENLIVE)  237 mL Oral BID BM  . feeding supplement (PRO-STAT SUGAR FREE 64)  30 mL Oral Daily  . LORazepam  1 mg Intravenous Once  . mouth rinse  15 mL Mouth Rinse BID  . mupirocin ointment  1 application Nasal BID  . pantoprazole  40 mg Oral BID  . piperacillin-tazobactam (ZOSYN)  IV  3.375 g Intravenous Q8H  . potassium chloride  40 mEq Oral Once  . QUEtiapine  12.5 mg Oral BID  . saccharomyces boulardii  250 mg Oral BID  . sodium hypochlorite   Irrigation Q12H  . thiamine  100 mg Oral Daily  . torsemide  20 mg Oral Daily  . vancomycin  1,000 mg Intravenous Q24H    Assessment/Plan Unstageable decubitus S/p DEBRIDMENT OF DECUBITUS ULCER 11 x 14 x 6 cm, 01/02/17, Dr. Antonieta Pert  AF on Eliquis Acute delirium Acute on chronic kidney injury Chronic diastolic CHF Hepatic and pancreatic lesions - MRI ordered  Chronic venous insuffiencey  Probable malnutrition - prealbumin 5.3 on 01/06/17 FEN: Cardiac diet ID: [email or web address] &Vancomycin 12/29/16 =>> day 10 DVT: Una  boots, no compression - last dose of Eliquiswas 12/31/16   Plan:  I would restart anticoagulation and see how he does.  If he bleeds excessively from the denuded areas we would have to hold or decrease anticoagulation. A nonreversible anticoagulant might not be first choice at this time. He is afebrile and his white count is stable. We could stop, and see how he does.  There are no cultures to direct therapy. Continue dressing changes and wound care with hydrotherapy.    LOS: 8 days    Special Ranes 01/07/2017 [id number]

## 2017-01-07 NOTE — Progress Notes (Signed)
PROGRESS NOTE  Jesus [name]  EXB:[id number] DOB: 08-Sep-1946 DOA: 12/29/2016 PCP: Miki Kins Outpatient Specialists:  Subjective: Patient is disoriented, awake and keep good eye contact.  Brief Narrative:  Jesus [name] a 71 y.o.malewith medical history significant for A.Fib, HLD, HTN, sacral decubitus ulcer. Patient presented to the ED via EMS from [facility] with c/o confusion and elevated WBC on routine labs. Concern was for infection in the sacral decubitus. Patient was hospitalized for management. Patient was placed on broad-spectrum antibiotics. Anticoagulation was held. Patient underwent surgical debridement on 1/26. Patient also noted to have hepatic and pancreatic lesions noted incidentally on CT scan. MRI abdomen is pending. Patient, however, is not progressing. Son agreeable to palliative medicine consult.  Assessment & Plan:   Principal Problem:   Decubitus ulcer of sacral region, unstageable [facility]) Active Problems:   Atrial fibrillation (HCC)   Chronic venous insufficiency   Chronic diastolic CHF (congestive heart failure) (HCC)   Acute on chronic renal failure (HCC)   Delirium    Infected Decubitus ulcer of sacral region -Large sacral decubitus ulcer, unstageable with concerns for infection.  -CT scan did show some air pockets without any evidence for abscess. Started on vancomycin and Zosyn. -Wound care was consulted. Subsequently, general surgery was consulted. Patient's anticoagulation was held.  -Patient underwent surgical debridement 1/26. Patient was placed on vancomycin and Zosyn, which is being continued.  -Continue hydrotherapy, though a general surgery recommendation.  Acute encephalopathy in the setting of questionable dementia Some of this is due to baseline cognitive impairment. According to patient's son, patient has had issues with memory for a few months. Patient was noted to be on Aricept a few months ago. Currently  on Depakote and Seroquel. Mental status has been stable. Workup done here included B-12 level which was normal. TSH normal. RPR and HIV are nonreactive. EEG did not show any epileptiform activity. MRI brain was limited due to motion, however, did not show any acute findings. Atrophy was present. Continue thiamine considering history of alcohol use in the past. Depakote level is nontoxic. Patient's verbalization of wanting to hurt himself, was most likely secondary to pain. Seen by psychiatry. Appreciate their input. No further intervention needed.   A.Fib  -CHA2DS2-VASc score is 4. Rate is controlled with Coreg. Patient was on Eliquis, which has been held for surgery. -Resume when okay with surgery.  -Hemoglobin noted to have dropped some, but stable today.  Normocytic anemia  -Hemoglobin noted to have drifted down, but stable. No overt bleeding has been noted. Bleeding noted from his wound.  Likely mild AKI on CKD stage 3/hypernatremia -Creatinine baseline 1.4, presented with creatinine 1.6 very close to baseline.  -Stop IV fluids restart diuresis.  Hypernatremia -Sodium is 154, back to normal    Chronic diastolic CHF  -Holding lasix and spironolactone for AKI. Seems euvolemic.  Hepatic and pancreatic lesions -Cystic lesions incidentally noted on CT scan.  -MRI done and showed possible liver cirrhosis, pancreatic lesion does not look malignant, likely benign versus pseudocyst.  Chronic venous insufficiency  Stable.  Goals of care Patient also noted to have generalized deconditioning. His quality of life appears to be poor. His activity level and functional status is not known, but likely poor considering presence of decubitus ulcer. Patient may benefit from palliative medicine input for goals of care. Discussed in detail with patient's son today. Even if the MRI abdomen does not show any concerning findings, patient's general medical status and health has been declining. He  has  poor quality of life. He is agreeable to talk to palliative medicine for goals of care. Patient has other children including 2 daughters and a sister.   DVT prophylaxis: SCDs (was on Eliquis at home on hold because of previous procedure) Code Status: Full Code Family Communication:  Disposition Plan:  Diet: Diet Heart Room service appropriate? Yes; Fluid consistency: Thin  Consultants:   Gen. surgery  Procedures:   None  Antimicrobials:   Zosyn and vancomycin  Objective: Vitals:   01/06/17 2118 01/07/17 0535 01/07/17 0904 01/07/17 1020  BP: (!) 154/75 140/61 (!) 136/54 (!) 146/65  Pulse: 76 75 (!) 57 74  Resp: 19 20 18    Temp: 98.5 F (36.9 C) 98.7 F (37.1 C) 97.7 F (36.5 C)   TempSrc: Oral Oral Oral   SpO2: 98% 99% 97%   Weight: 119.8 kg (264 lb 3.2 oz)     Height:        Intake/Output Summary (Last 24 hours) at 01/07/17 1044 Last data filed at 01/07/17 0904  Gross per 24 hour  Intake            147.5 ml  Output              625 ml  Net           -477.5 ml   Filed Weights   01/01/17 2027 01/05/17 2032 01/06/17 2118  Weight: (!) 136.4 kg (300 lb 11.3 oz) 119.3 kg (263 lb) 119.8 kg (264 lb 3.2 oz)    Examination: General exam: Appears calm and comfortable  Respiratory system: Clear to auscultation. Respiratory effort normal. Cardiovascular system: S1 & S2 heard, RRR. No JVD, murmurs, rubs, gallops or clicks. No pedal edema. Gastrointestinal system: Abdomen is nondistended, soft and nontender. No organomegaly or masses felt. Normal bowel sounds heard. Central nervous system: Alert and oriented. No focal neurological deficits. Extremities: Symmetric 5 x 5 power. Skin: No rashes, lesions or ulcers Psychiatry: Judgement and insight appear normal. Mood & affect appropriate.   Data Reviewed: I have personally reviewed following labs and imaging studies  CBC:  Recent Labs Lab 01/02/17 0559 01/03/17 0515 01/05/17 0509 01/06/17 0652 01/07/17 0644  WBC  13.2* 11.9* 10.5 13.1* 10.7*  HGB 12.0* 10.7* 8.9* 8.9* 8.6*  HCT 39.9 35.5* 28.9* 29.2* 28.4*  MCV 89.7 90.1 88.4 88.5 89.3  PLT 287 241 212 219 224   Basic Metabolic Panel:  Recent Labs Lab 01/02/17 0559 01/03/17 0515 01/05/17 0509 01/06/17 0652 01/07/17 0644  NA 151* 149* 143 142 142  K 4.0 3.8 3.5 3.6 3.7  CL 111 112* 108 105 105  CO2 30 29 28 28 29   GLUCOSE 118* 119* 114* 128* 108*  BUN 32* 28* 28* 26* 26*  CREATININE 1.16 1.16 1.19 1.21 1.10  CALCIUM 10.1 9.5 9.3 9.1 9.1   GFR: Estimated Creatinine Clearance: 81 mL/min (by C-G formula based on SCr of 1.1 mg/dL). Liver Function Tests:  Recent Labs Lab 01/01/17 0518  AST 23  ALT 20  ALKPHOS 171*  BILITOT 1.5*  PROT 6.3*  ALBUMIN 1.9*   No results for input(s): LIPASE, AMYLASE in the last 168 hours. No results for input(s): AMMONIA in the last 168 hours. Coagulation Profile: No results for input(s): INR, PROTIME in the last 168 hours. Cardiac Enzymes: No results for input(s): CKTOTAL, CKMB, CKMBINDEX, TROPONINI in the last 168 hours. BNP (last 3 results) No results for input(s): PROBNP in the last 8760 hours. HbA1C: No results for input(s): HGBA1C  in the last 72 hours. CBG: No results for input(s): GLUCAP in the last 168 hours. Lipid Profile: No results for input(s): CHOL, HDL, LDLCALC, TRIG, CHOLHDL, LDLDIRECT in the last 72 hours. Thyroid Function Tests: No results for input(s): TSH, T4TOTAL, FREET4, T3FREE, THYROIDAB in the last 72 hours. Anemia Panel: No results for input(s): VITAMINB12, FOLATE, FERRITIN, TIBC, IRON, RETICCTPCT in the last 72 hours. Urine analysis:    Component Value Date/Time   COLORURINE YELLOW 12/29/2016 2053   APPEARANCEUR CLEAR 12/29/2016 2053   LABSPEC 1.009 12/29/2016 2053   PHURINE 6.0 12/29/2016 2053   GLUCOSEU NEGATIVE 12/29/2016 2053   HGBUR NEGATIVE 12/29/2016 2053   BILIRUBINUR NEGATIVE 12/29/2016 2053   KETONESUR NEGATIVE 12/29/2016 2053   PROTEINUR NEGATIVE  12/29/2016 2053   UROBILINOGEN 4.0 (H) 06/02/2012 1200   NITRITE NEGATIVE 12/29/2016 2053   LEUKOCYTESUR MODERATE (A) 12/29/2016 2053   Sepsis Labs: @LABRCNTIP (procalcitonin:4,lacticidven:4)  ) Recent Results (from the past 240 hour(s))  Culture, blood (routine x 2)     Status: None   Collection Time: 12/29/16  7:16 PM  Result Value Ref Range Status   Specimen Description BLOOD RIGHT ANTECUBITAL  Final   Special Requests BOTTLES DRAWN AEROBIC AND ANAEROBIC 5CC  Final   Culture NO GROWTH 5 DAYS  Final   Report Status 01/03/2017 FINAL  Final  Culture, blood (routine x 2)     Status: None   Collection Time: 12/29/16  8:59 PM  Result Value Ref Range Status   Specimen Description BLOOD RIGHT HAND  Final   Special Requests IN PEDIATRIC BOTTLE 4CC  Final   Culture NO GROWTH 5 DAYS  Final   Report Status 01/03/2017 FINAL  Final  Surgical PCR screen     Status: Abnormal   Collection Time: 01/02/17  8:10 AM  Result Value Ref Range Status   MRSA, PCR POSITIVE (A) NEGATIVE Final    Comment: RESULT CALLED TO, READ BACK BY AND VERIFIED WITH: C. Peeler NT 11:05 01/02/17 ([email or web address])    Staphylococcus aureus POSITIVE (A) NEGATIVE Final    Comment:        The Xpert SA Assay (FDA approved for NASAL specimens in patients over 71 years of age), is one component of a comprehensive surveillance program.  Test performance has been validated by [facility] for patients greater than or equal to 847 year old. It is not intended to diagnose infection nor to guide or monitor treatment.      Invalid input(s): PROCALCITONIN, LACTICACIDVEN   Radiology Studies: Mr Abdomen Wo Contrast  Result Date: 01/06/2017 CLINICAL DATA:  Evaluate liver and pancreas cysts. EXAM: MRI ABDOMEN WITHOUT CONTRAST TECHNIQUE: Multiplanar multisequence MR imaging was performed without the administration of intravenous contrast. COMPARISON:  12/30/2016. FINDINGS: Exam detail is significantly diminished secondary to  respiratory motion artifact. The post-contrast portion of the examination was not performed secondary to motion artifact. Lower chest: The heart size is enlarged. Aortic atherosclerosis noted. No pleural or pericardial effusion noted. Hepatobiliary: Relative hypertrophy of the caudate lobe of liver and there are several simple appearing cyst identified within the liver. The largest is in the lateral segment of left lobe adjacent to the falciform ligament. This measures 4.1 cm, image 19 of series 8. The gallbladder appears within normal limits. No gallbladder wall thickening. No intrahepatic bile duct dilatation. The common bile duct is normal in caliber. Pancreas: Significantly diminished exam detail secondary to motion artifact. There is a Uni locular cystic lesion within the head and uncinate process of the pancreas  measuring 4.3 x 3.8 cm, image 28 of series 8. Within the limitation of unenhanced technique, there is no internal septation or soft tissue component identified. No solid mass, inflammatory changes or other parenchymal abnormality identified. Spleen: The spleen is enlarged measuring 13.7 cm in length. No focal splenic abnormality noted. Adrenals/Urinary Tract: Unremarkable appearance of the adrenal glands. No kidney mass or hydronephrosis identified. Stomach/Bowel: The stomach is normal. The small bowel loops have a normal course and caliber without evidence for bowel obstruction. Vascular/Lymphatic: Aortic atherosclerosis. No aneurysm. No upper abdominal adenopathy. Other:  No ascites or focal fluid collections. Musculoskeletal: No suspicious bone lesions identified. IMPRESSION: 1. Significantly diminished exam detail secondary to motion artifact. Patient was not alert and unable to breath hold during the exam. 2. Morphologic features of liver compatible with cirrhosis. 3. Splenomegaly. 4. Liver cysts. 5. It unilocular cystic structure within the head of pancreas is identified. Although incompletely  characterized without IV contrast material there is no internal septation or solid mural component identified. If there is a history of pancreatitis this may represent a pseudocyst. Alternatively, findings may reflect a benign (or less likely malignant) cystic neoplasm of the pancreas. Followup examination in 6 months is advised to re- assess this lesion. At this time if the patient is unable to fully cooperate with the exam an adequately breath hold a pancreas protocol CT of the upper abdomen may provide more accurate results. Electronically Signed   By: Signa Kell M.D.   On: 01/06/2017 19:10        Scheduled Meds: . atorvastatin  80 mg Oral q1800  . carvedilol  6.25 mg Oral BID WC  . Chlorhexidine Gluconate Cloth  6 each Topical Q0600  . divalproex  500 mg Oral QHS  . feeding supplement (ENSURE ENLIVE)  237 mL Oral BID BM  . feeding supplement (PRO-STAT SUGAR FREE 64)  30 mL Oral Daily  . LORazepam  1 mg Intravenous Once  . mouth rinse  15 mL Mouth Rinse BID  . mupirocin ointment  1 application Nasal BID  . pantoprazole  40 mg Oral BID  . piperacillin-tazobactam (ZOSYN)  IV  3.375 g Intravenous Q8H  . QUEtiapine  12.5 mg Oral BID  . saccharomyces boulardii  250 mg Oral BID  . sodium hypochlorite   Irrigation Q12H  . thiamine  100 mg Oral Daily  . vancomycin  1,000 mg Intravenous Q24H   Continuous Infusions: . sodium chloride 75 mL/hr at 01/07/17 0632     LOS: 8 days    Time spent: 35 minutes    Jeffry Vogelsang A, MD [facility] Pager [phone]  If 7PM-7AM, please contact night-coverage [email or web address] Password [facility] 01/07/2017, 10:44 AM

## 2017-01-07 NOTE — Progress Notes (Signed)
Physical Therapy Wound Treatment Patient Details  Name: DICK HARK MRN: [id number] Date of Birth: [birth year]  Today's Date: 01/07/2017 Time: 0905-1000 Time Calculation (min): 55 min  Subjective  Subjective: pt talkative, but difficult to understand and doesn't stay on topic.   Patient and Family Stated Goals: Pt did not state goals during session Date of Onset:  (Chronic) Prior Treatments: I&D 01/02/17  Pain Score: Pain Score: 10-Worst pain ever  Wound Assessment  Pressure Injury 12/29/16 Unstageable - Full thickness tissue loss in which the base of the ulcer is covered by slough (yellow, tan, gray, green or brown) and/or eschar (tan, brown or black) in the wound bed. (Active)  Dressing Type ABD;Barrier Film (skin prep);Moist to dry;Gauze (Comment) 01/07/2017 11:00 AM  Dressing Changed 01/07/2017 11:00 AM  Dressing Change Frequency Daily 01/07/2017 11:00 AM  State of Healing Early/partial granulation 01/07/2017 11:00 AM  Site / Wound Assessment Red;Yellow;Bleeding;Black 01/07/2017 11:00 AM  % Wound base Red or Granulating 40% 01/07/2017 11:00 AM  % Wound base Yellow/Fibrinous Exudate 40% 01/07/2017 11:00 AM  % Wound base Black/Eschar 20% 01/07/2017 11:00 AM  % Wound base Other/Granulation Tissue (Comment) 0% 01/07/2017 11:00 AM  Peri-wound Assessment Maceration;Induration 01/07/2017 11:00 AM  Wound Length (cm) 11 cm 01/06/2017  3:22 PM  Wound Width (cm) 15 cm 01/06/2017  3:22 PM  Wound Depth (cm) 6 cm 01/06/2017  3:22 PM  Drainage Amount Moderate 01/07/2017 11:00 AM  Drainage Description Purulent;Serosanguineous 01/07/2017 11:00 AM  Treatment Debridement (Selective);Hydrotherapy (Pulse lavage);Packing (Saline gauze) 01/07/2017 11:00 AM     Pressure Injury 12/31/16 Stage II -  Partial thickness loss of dermis presenting as a shallow open ulcer with a red, pink wound bed without slough. per SNF present at the last time wraps changed. (Active)  Dressing Type Other (Comment) 01/06/2017  8:08 PM   Site / Wound Assessment Other (Comment) 01/06/2017  8:08 PM  Wound Length (cm) 4 cm 12/31/2016  3:00 PM  Wound Width (cm) 4.5 cm 12/31/2016  3:00 PM  Wound Depth (cm) 0 cm 12/31/2016  3:00 PM  Drainage Amount None 01/06/2017  8:08 PM  Treatment Other (Comment) 12/31/2016  3:00 PM     Incision (Closed) 01/02/17 Sacrum Other (Comment) (Active)  Dressing Type Gauze (Comment);ABD 01/06/2017  8:08 PM  Dressing Changed 01/06/2017  8:08 PM  Dressing Change Frequency Twice a day 01/06/2017  8:08 PM  Site / Wound Assessment Red;Yellow;Painful 01/06/2017  8:08 PM  Drainage Amount Moderate 01/06/2017  8:08 PM  Treatment Cleansed;Other (Comment) 01/06/2017 10:15 AM   Hydrotherapy Pulsed lavage therapy - wound location: Sacrum Pulsed Lavage with Suction (psi): 12 psi Pulsed Lavage with Suction - Normal Saline Used: 1000 mL Pulsed Lavage Tip: Tip with splash shield Selective Debridement Selective Debridement - Location: Sacrum Selective Debridement - Tools Used: Forceps;Scissors Selective Debridement - Tissue Removed: Yellow and black necrotic tissue   Wound Assessment and Plan  Wound Therapy - Assess/Plan/Recommendations Wound Therapy - Clinical Statement: Surgery PA present at beginning of session to assess wound.  D/C Dakins and change dressings to Xeroform to superficial area on pt's L and Saline W-D packing to the rest of the wound.   Wound Therapy - Functional Problem List: Decreased tolerance for OOB, bed mobility; acute pain Factors Delaying/Impairing Wound Healing: Altered sensation;Immobility;Multiple medical problems;Vascular compromise Hydrotherapy Plan: Debridement;Dressing change;Patient/family education;Pulsatile lavage with suction Wound Therapy - Frequency: 6X / week Wound Therapy - Follow Up Recommendations: Skilled nursing facility Wound Plan: See above  Wound Therapy Goals- Improve the function  of patient's integumentary system by progressing the wound(s) through the phases of wound  healing (inflammation - proliferation - remodeling) by: Decrease Necrotic Tissue to: 25% Decrease Necrotic Tissue - Progress: Progressing toward goal Increase Granulation Tissue to: 75% Increase Granulation Tissue - Progress: Progressing toward goal Goals/treatment plan/discharge plan were made with and agreed upon by patient/family: Yes Time For Goal Achievement: 7 days Wound Therapy - Potential for Goals: Good  Goals will be updated until maximal potential achieved or discharge criteria met.  Discharge criteria: when goals achieved, discharge from hospital, MD decision/surgical intervention, no progress towards goals, refusal/missing three consecutive treatments without notification or medical reason.  Central Aguirre, PT [phone] 01/07/2017, 11:11 AM

## 2017-01-07 NOTE — Care Management Important Message (Signed)
Important Message  Patient Details  Name: [name] M Feldmeier MRN: [id number] Date of Birth: 1946-11-04   Medicare Important Message Given:  Yes    Lawerance Sabalebbie Levana Minetti, RN 01/07/2017, 11:26 AM

## 2017-01-07 NOTE — Progress Notes (Signed)
Patient is currently NPO and requesting water. MD notified. [name] MD gave a verbal order to place patient back on his heart healthy diet. Order placed. Will continue to monitor.

## 2017-01-07 NOTE — Progress Notes (Signed)
Speech Language Pathology Treatment: Dysphagia  Patient Details Name: Jesus [name] MRN: [id number] DOB: 05-Sep-1946 Today's Date: 01/07/2017 Time: [id number] SLP Time Calculation (min) (ACUTE ONLY): 23 min  Assessment / Plan / Recommendation Clinical Impression  Mr. Papp required cueing and directions in order to attend to POs. Pt was observed with thin liquids via straw and regular solids and exhibited no overt s/s of aspiration at bedside. During intake of regular solids, SLP had to provide several cues to "chew and swallow" due to pt's decreased awareness and alertness which resulted in prolonged oral transit time and oral pocketing/holding requiring removal by SLP. Due to pt's fluctuating mentation, recommend downgrading diet from regular solids to dysphagia 2 solids, thin liquids (straws ok), meds whole in puree; check for oral pocketing after consumption of solids. ST will continue to f/u to assess pt's safety/efficiency, swallowing function, and possible diet upgrade.   HPI HPI: 71 y.o. male with medical history significant of A.Fib, HLD, HTN, sacral decubitus ulcer.  Patient [facility] the ED [facility] with c/o confusion and elevated WBC today on routine labs.  It is unclear how long patient has been confused for and patient isnt able to recall specific details regarding how long his symptoms have been ongoing.  Does describe severe pain in buttock area and pain "everywhere" when he tries to move his legs (the latter presumably from the known contractures he has).      SLP Plan  Continue with current plan of care     Recommendations  Diet recommendations: Dysphagia 2 (fine chop);Thin liquid Liquids provided via: Cup;Straw Medication Administration: Whole meds with puree Supervision: Staff to assist with self feeding;Full supervision/cueing for compensatory strategies Compensations: Minimize environmental distractions;Slow rate;Small sips/bites;Lingual  sweep for clearance of pocketing Postural Changes and/or Swallow Maneuvers: Seated upright 90 degrees                Oral Care Recommendations: Oral care BID Follow up Recommendations: Other (comment) (TBD) Plan: Continue with current plan of care       GO                Macarthur CritchleyMeredith Bianney Rockwood , Student-SLP 01/07/2017, 3:25 PM

## 2017-01-07 NOTE — Progress Notes (Signed)
Patients B/P is 136/54 and HR 57. MD notified. MD did not respond. B/P re-checked an hour later. B/P 146/65 and HR 74. Coreg given. Will continue to monitor.

## 2017-01-07 NOTE — Consult Note (Signed)
WOC Nurse wound follow up Wound type: Sacral ulcer managed by CCS, I was asked to see patient with CCS PA at hydrotherapy this am but was not on the campus at the time of the treatment unexpectedly I have discussed with PT Aundra MilletMegan that performed the hydrotherapy and reviewed chart.  CCS PA has updated wound care orders, WOC will follow along for sacrum as needed. LE compression changed today per SNF POC, it is noted today at the time of the change that this patient has 4 new areas on the LE related to pressure most likely from the compression, however he needs the compression to manage the venous stasis dx.  He has new ulceration on the right lateral leg that is weeping but very superficial New DTI right proximal pretibial, covered with foam for protection and pressure redistribution New Stage 2 right dorsal foot, cover with foam for protection and pressure redistribution New DTI left proximal pretibial, utilized layer number 1 to add padding to this area New Stage 2 Pressure injury left heel:cover with foam for protection and pressure redistribution Existing Stage 2 to the right heel, is still an intact serous filled bulla, protected with silicone foam as well.   Due to patients pain with recent PT and trying to sit this patient up I was not able to measure the areas of concern today, I will follow up with the next change Wound bed: see above Drainage (amount, consistency, odor) slight weeping from the right lateral wound, but serous. Sanginous drainage from the left heel; most likely from the rupture of a blister. All DTIs have intact skin with no drainage. Periwound: venous stasis dermatitis Dressing procedure/placement/frequency: See changes in my notes above CCS managing sacral wound, I will attempt to see patient with hydrotherapy this week to also see this wound.  New 4 layer compression wraps applied to the bilateral LE, changes due on Wednesdays.  WOC Nurse team will follow along with you  for weekly wound assessments.  Please notify me of any acute changes in the wounds or any new areas of concerns [facility] MSN, RN,CWOCN, CNS [phone]

## 2017-01-08 DIAGNOSIS — Z515 Encounter for palliative care: Secondary | ICD-10-CM

## 2017-01-08 LAB — BASIC METABOLIC PANEL
Anion gap: 7 (ref 5–15)
BUN: 27 mg/dL — ABNORMAL HIGH (ref 6–20)
CO2: 30 mmol/L (ref 22–32)
Calcium: 9.2 mg/dL (ref 8.9–10.3)
Chloride: 104 mmol/L (ref 101–111)
Creatinine, Ser: 1.08 mg/dL (ref 0.61–1.24)
GFR calc Af Amer: >60 mL/min (ref >60)
GLUCOSE: 122 mg/dL — AB (ref 65–99)
POTASSIUM: 3.8 mmol/L (ref 3.5–5.1)
Sodium: 141 mmol/L (ref 135–145)

## 2017-01-08 LAB — CBC
HEMATOCRIT: 27.1 % — AB (ref 39.0–52.0)
Hemoglobin: 8.4 g/dL — ABNORMAL LOW (ref 13.0–17.0)
MCH: 27.7 pg (ref 26.0–34.0)
MCHC: 31 g/dL (ref 30.0–36.0)
MCV: 89.4 fL (ref 78.0–100.0)
Platelets: 244 10*3/uL (ref 150–400)
RBC: 3.03 MIL/uL — ABNORMAL LOW (ref 4.22–5.81)
RDW: 17.3 % — ABNORMAL HIGH (ref 11.5–15.5)
WBC: 9.4 10*3/uL (ref 4.0–10.5)

## 2017-01-08 LAB — VANCOMYCIN, TROUGH: Vancomycin Tr: 16 ug/mL (ref 15–20)

## 2017-01-08 MED ORDER — APIXABAN 5 MG PO TABS
5.0000 mg | ORAL_TABLET | Freq: Two times a day (BID) | ORAL | Status: DC
  Administered 2017-01-08 – 2017-01-15 (×16): 5 mg via ORAL
  Filled 2017-01-08 (×16): qty 1

## 2017-01-08 MED ORDER — PRO-STAT SUGAR FREE PO LIQD
30.0000 mL | Freq: Three times a day (TID) | ORAL | Status: DC
  Administered 2017-01-08 – 2017-01-15 (×14): 30 mL via ORAL
  Filled 2017-01-08 (×14): qty 30

## 2017-01-08 MED ORDER — PANTOPRAZOLE SODIUM 40 MG PO PACK
40.0000 mg | PACK | Freq: Two times a day (BID) | ORAL | Status: DC
  Administered 2017-01-08 – 2017-01-20 (×25): 40 mg via ORAL
  Filled 2017-01-08 (×27): qty 20

## 2017-01-08 MED ORDER — ENSURE ENLIVE PO LIQD
237.0000 mL | Freq: Three times a day (TID) | ORAL | Status: DC
  Administered 2017-01-09 – 2017-01-23 (×32): 237 mL via ORAL

## 2017-01-08 NOTE — Progress Notes (Signed)
PROGRESS NOTE  Jesus [name]  ZOX:[id number] DOB: October 19, 1946 DOA: 12/29/2016 PCP: Miki Kins Outpatient Specialists:  Subjective: Continues to be disoriented, somewhat somnolent but easy to arouse.  Brief Narrative:  Jesus [name] a 71 y.o.malewith medical history significant for A.Fib, HLD, HTN, sacral decubitus ulcer. Patient presented to the ED via EMS from [facility] with c/o confusion and elevated WBC on routine labs. Concern was for infection in the sacral decubitus. Patient was hospitalized for management. Patient was placed on broad-spectrum antibiotics. Anticoagulation was held. Patient underwent surgical debridement on 1/26. Patient also noted to have hepatic and pancreatic lesions noted incidentally on CT scan. MRI abdomen is pending. Patient, however, is not progressing. Son agreeable to palliative medicine consult.  Assessment & Plan:   Principal Problem:   Decubitus ulcer of sacral region, unstageable [facility]) Active Problems:   Atrial fibrillation (HCC)   Chronic venous insufficiency   Chronic diastolic CHF (congestive heart failure) (HCC)   Acute on chronic renal failure (HCC)   Delirium   Infected Decubitus ulcer of sacral region -Large sacral decubitus ulcer, unstageable with concerns for infection.  -CT scan did show some air pockets without any evidence for abscess. Started on vancomycin and Zosyn. -Wound care was consulted. Subsequently, general surgery was consulted. Patient's anticoagulation was held.  -Patient underwent surgical debridement 1/26. Patient was placed on vancomycin and Zosyn, which is being continued.  -Continue hydrotherapy, general surgery recommended antibiotics for 4 more days and restart anticoagulation.  Acute encephalopathy in the setting of questionable dementia Some of this is due to baseline cognitive impairment. According to patient's son, patient has had issues with memory for a few months. Patient was  noted to be on Aricept a few months ago. Currently on Depakote and Seroquel. Mental status has been stable. Workup done here included B-12 level which was normal. TSH normal. RPR and HIV are nonreactive. EEG did not show any epileptiform activity. MRI brain was limited due to motion, however, did not show any acute findings. Atrophy was present. Continue thiamine considering history of alcohol use in the past. Depakote level is nontoxic. Patient's verbalization of wanting to hurt himself, was most likely secondary to pain. Seen by psychiatry. Appreciate their input. No further intervention needed.   A.Fib  -CHA2DS2-VASc score is 4. Rate is controlled with Coreg. Patient was on Eliquis, which has been held for surgery. -Resume when okay with surgery.  -Hemoglobin overall trend is going down, Eliquis restarted, continue to follow hemoglobin closely.  Normocytic anemia  -Hemoglobin noted to have drifted down, but stable. No overt bleeding has been noted. Bleeding noted from his wound.  Likely mild AKI on CKD stage 3/hypernatremia -Creatinine baseline 1.4, presented with creatinine 1.6 very close to baseline.  -Stop IV fluids restart diuresis.  Hypernatremia -Sodium is 154, back to normal    Chronic diastolic CHF  -Holding lasix and spironolactone for AKI. Seems euvolemic. Restarted Demadex.  Hepatic and pancreatic lesions -Cystic lesions incidentally noted on CT scan.  -MRI done and showed possible liver cirrhosis, pancreatic lesion does not look malignant, likely benign versus pseudocyst.  Chronic venous insufficiency  Stable.  Goals of care Patient also noted to have generalized deconditioning. His quality of life appears to be poor. His activity level and functional status is not known, but likely poor considering presence of decubitus ulcer. Patient may benefit from palliative medicine input for goals of care. Discussed in detail with patient's son today. Even if the MRI abdomen  does not show  any concerning findings, patient's general medical status and health has been declining. He has poor quality of life. He is agreeable to talk to palliative medicine for goals of care. Patient has other children including 2 daughters and a sister.   DVT prophylaxis: SCDs (was on Eliquis at home on hold because of previous procedure) Code Status: Full Code Family Communication:  Disposition Plan:  Diet: DIET DYS 2 Room service appropriate? Yes; Fluid consistency: Thin  Consultants:   Gen. surgery  Procedures:   None  Antimicrobials:   Zosyn and vancomycin  Objective: Vitals:   01/07/17 1722 01/07/17 2200 01/08/17 0500 01/08/17 0948  BP: 137/68 126/60 134/69 114/70  Pulse: 74 75 67 72  Resp: 16 17 18 18   Temp: 98.7 F (37.1 C) 98.6 F (37 C) 97.4 F (36.3 C) 97.8 F (36.6 C)  TempSrc: Oral Oral Oral Oral  SpO2: 93% 100% 95% 98%  Weight:      Height:        Intake/Output Summary (Last 24 hours) at 01/08/17 1124 Last data filed at 01/08/17 0949  Gross per 24 hour  Intake              740 ml  Output             1050 ml  Net             -310 ml   Filed Weights   01/01/17 2027 01/05/17 2032 01/06/17 2118  Weight: (!) 136.4 kg (300 lb 11.3 oz) 119.3 kg (263 lb) 119.8 kg (264 lb 3.2 oz)    Examination: General exam: Appears calm and comfortable  Respiratory system: Clear to auscultation. Respiratory effort normal. Cardiovascular system: S1 & S2 heard, RRR. No JVD, murmurs, rubs, gallops or clicks. No pedal edema. Gastrointestinal system: Abdomen is nondistended, soft and nontender. No organomegaly or masses felt. Normal bowel sounds heard. Central nervous system: Alert and oriented. No focal neurological deficits. Extremities: Symmetric 5 x 5 power. Skin: No rashes, lesions or ulcers Psychiatry: Judgement and insight appear normal. Mood & affect appropriate.   Data Reviewed: I have personally reviewed following labs and imaging studies  CBC:  Recent  Labs Lab 01/03/17 0515 01/05/17 0509 01/06/17 0652 01/07/17 0644 01/08/17 0629  WBC 11.9* 10.5 13.1* 10.7* 9.4  HGB 10.7* 8.9* 8.9* 8.6* 8.4*  HCT 35.5* 28.9* 29.2* 28.4* 27.1*  MCV 90.1 88.4 88.5 89.3 89.4  PLT 241 212 219 224 244   Basic Metabolic Panel:  Recent Labs Lab 01/03/17 0515 01/05/17 0509 01/06/17 0652 01/07/17 0644 01/08/17 0629  NA 149* 143 142 142 141  K 3.8 3.5 3.6 3.7 3.8  CL 112* 108 105 105 104  CO2 29 28 28 29 30   GLUCOSE 119* 114* 128* 108* 122*  BUN 28* 28* 26* 26* 27*  CREATININE 1.16 1.19 1.21 1.10 1.08  CALCIUM 9.5 9.3 9.1 9.1 9.2   GFR: Estimated Creatinine Clearance: 82.5 mL/min (by C-G formula based on SCr of 1.08 mg/dL). Liver Function Tests: No results for input(s): AST, ALT, ALKPHOS, BILITOT, PROT, ALBUMIN in the last 168 hours. No results for input(s): LIPASE, AMYLASE in the last 168 hours. No results for input(s): AMMONIA in the last 168 hours. Coagulation Profile: No results for input(s): INR, PROTIME in the last 168 hours. Cardiac Enzymes: No results for input(s): CKTOTAL, CKMB, CKMBINDEX, TROPONINI in the last 168 hours. BNP (last 3 results) No results for input(s): PROBNP in the last 8760 hours. HbA1C: No results for input(s): HGBA1C in  the last 72 hours. CBG: No results for input(s): GLUCAP in the last 168 hours. Lipid Profile: No results for input(s): CHOL, HDL, LDLCALC, TRIG, CHOLHDL, LDLDIRECT in the last 72 hours. Thyroid Function Tests: No results for input(s): TSH, T4TOTAL, FREET4, T3FREE, THYROIDAB in the last 72 hours. Anemia Panel: No results for input(s): VITAMINB12, FOLATE, FERRITIN, TIBC, IRON, RETICCTPCT in the last 72 hours. Urine analysis:    Component Value Date/Time   COLORURINE YELLOW 12/29/2016 2053   APPEARANCEUR CLEAR 12/29/2016 2053   LABSPEC 1.009 12/29/2016 2053   PHURINE 6.0 12/29/2016 2053   GLUCOSEU NEGATIVE 12/29/2016 2053   HGBUR NEGATIVE 12/29/2016 2053   BILIRUBINUR NEGATIVE 12/29/2016  2053   KETONESUR NEGATIVE 12/29/2016 2053   PROTEINUR NEGATIVE 12/29/2016 2053   UROBILINOGEN 4.0 (H) 06/02/2012 1200   NITRITE NEGATIVE 12/29/2016 2053   LEUKOCYTESUR MODERATE (A) 12/29/2016 2053   Sepsis Labs: @LABRCNTIP (procalcitonin:4,lacticidven:4)  ) Recent Results (from the past 240 hour(s))  Culture, blood (routine x 2)     Status: None   Collection Time: 12/29/16  7:16 PM  Result Value Ref Range Status   Specimen Description BLOOD RIGHT ANTECUBITAL  Final   Special Requests BOTTLES DRAWN AEROBIC AND ANAEROBIC 5CC  Final   Culture NO GROWTH 5 DAYS  Final   Report Status 01/03/2017 FINAL  Final  Culture, blood (routine x 2)     Status: None   Collection Time: 12/29/16  8:59 PM  Result Value Ref Range Status   Specimen Description BLOOD RIGHT HAND  Final   Special Requests IN PEDIATRIC BOTTLE 4CC  Final   Culture NO GROWTH 5 DAYS  Final   Report Status 01/03/2017 FINAL  Final  Surgical PCR screen     Status: Abnormal   Collection Time: 01/02/17  8:10 AM  Result Value Ref Range Status   MRSA, PCR POSITIVE (A) NEGATIVE Final    Comment: RESULT CALLED TO, READ BACK BY AND VERIFIED WITH: C. Peeler NT 11:05 01/02/17 ([email or web address])    Staphylococcus aureus POSITIVE (A) NEGATIVE Final    Comment:        The Xpert SA Assay (FDA approved for NASAL specimens in patients over 61 years of age), is one component of a comprehensive surveillance program.  Test performance has been validated by [facility] for patients greater than or equal to 57 year old. It is not intended to diagnose infection nor to guide or monitor treatment.      Invalid input(s): PROCALCITONIN, LACTICACIDVEN   Radiology Studies: Mr Abdomen Wo Contrast  Result Date: 01/06/2017 CLINICAL DATA:  Evaluate liver and pancreas cysts. EXAM: MRI ABDOMEN WITHOUT CONTRAST TECHNIQUE: Multiplanar multisequence MR imaging was performed without the administration of intravenous contrast. COMPARISON:  12/30/2016.  FINDINGS: Exam detail is significantly diminished secondary to respiratory motion artifact. The post-contrast portion of the examination was not performed secondary to motion artifact. Lower chest: The heart size is enlarged. Aortic atherosclerosis noted. No pleural or pericardial effusion noted. Hepatobiliary: Relative hypertrophy of the caudate lobe of liver and there are several simple appearing cyst identified within the liver. The largest is in the lateral segment of left lobe adjacent to the falciform ligament. This measures 4.1 cm, image 19 of series 8. The gallbladder appears within normal limits. No gallbladder wall thickening. No intrahepatic bile duct dilatation. The common bile duct is normal in caliber. Pancreas: Significantly diminished exam detail secondary to motion artifact. There is a Uni locular cystic lesion within the head and uncinate process of the pancreas measuring  4.3 x 3.8 cm, image 28 of series 8. Within the limitation of unenhanced technique, there is no internal septation or soft tissue component identified. No solid mass, inflammatory changes or other parenchymal abnormality identified. Spleen: The spleen is enlarged measuring 13.7 cm in length. No focal splenic abnormality noted. Adrenals/Urinary Tract: Unremarkable appearance of the adrenal glands. No kidney mass or hydronephrosis identified. Stomach/Bowel: The stomach is normal. The small bowel loops have a normal course and caliber without evidence for bowel obstruction. Vascular/Lymphatic: Aortic atherosclerosis. No aneurysm. No upper abdominal adenopathy. Other:  No ascites or focal fluid collections. Musculoskeletal: No suspicious bone lesions identified. IMPRESSION: 1. Significantly diminished exam detail secondary to motion artifact. Patient was not alert and unable to breath hold during the exam. 2. Morphologic features of liver compatible with cirrhosis. 3. Splenomegaly. 4. Liver cysts. 5. It unilocular cystic structure  within the head of pancreas is identified. Although incompletely characterized without IV contrast material there is no internal septation or solid mural component identified. If there is a history of pancreatitis this may represent a pseudocyst. Alternatively, findings may reflect a benign (or less likely malignant) cystic neoplasm of the pancreas. Followup examination in 6 months is advised to re- assess this lesion. At this time if the patient is unable to fully cooperate with the exam an adequately breath hold a pancreas protocol CT of the upper abdomen may provide more accurate results. Electronically Signed   By: Signa Kellaylor  Stroud M.D.   On: 01/06/2017 19:10        Scheduled Meds: . apixaban  5 mg Oral BID  . atorvastatin  80 mg Oral q1800  . carvedilol  6.25 mg Oral BID WC  . Chlorhexidine Gluconate Cloth  6 each Topical Q0600  . divalproex  500 mg Oral QHS  . feeding supplement (ENSURE ENLIVE)  237 mL Oral BID BM  . feeding supplement (PRO-STAT SUGAR FREE 64)  30 mL Oral Daily  . LORazepam  1 mg Intravenous Once  . mouth rinse  15 mL Mouth Rinse BID  . mupirocin ointment  1 application Nasal BID  . pantoprazole sodium  40 mg Oral BID  . piperacillin-tazobactam (ZOSYN)  IV  3.375 g Intravenous Q8H  . QUEtiapine  12.5 mg Oral BID  . saccharomyces boulardii  250 mg Oral BID  . thiamine  100 mg Oral Daily  . torsemide  20 mg Oral Daily  . vancomycin  1,000 mg Intravenous Q24H   Continuous Infusions:    LOS: 9 days    Time spent: 35 minutes    Ryllie Nieland A, MD [facility] Pager [phone]  If 7PM-7AM, please contact night-coverage [email or web address] Password [email or web address] 01/08/2017, 11:24 AM

## 2017-01-08 NOTE — Progress Notes (Signed)
Physical Therapy Wound Treatment Patient Details  Name: Jesus [name] MRN: [id number] Date of Birth: [birth year]  Today's Date: 01/08/2017 Time: 8593-9655 Time Calculation (min): 49 min  Subjective  Subjective: pt talkative, but difficult to understand and doesn't stay on topic.   Patient and Family Stated Goals: Pt did not state goals during session Date of Onset:  (Chronic) Prior Treatments: I&D 01/02/17  Pain Score:    Wound Assessment  Pressure Injury 12/29/16 Unstageable - Full thickness tissue loss in which the base of the ulcer is covered by slough (yellow, tan, gray, green or brown) and/or eschar (tan, brown or black) in the wound bed. (Active)  Dressing Type ABD;Barrier Film (skin prep);Moist to dry;Gauze (Comment) 01/08/2017 11:00 AM  Dressing Changed 01/08/2017 11:00 AM  Dressing Change Frequency Daily 01/08/2017 11:00 AM  State of Healing Early/partial granulation 01/08/2017 11:00 AM  Site / Wound Assessment Red;Yellow;Bleeding;Black 01/08/2017 11:00 AM  % Wound base Red or Granulating 40% 01/08/2017 11:00 AM  % Wound base Yellow/Fibrinous Exudate 40% 01/08/2017 11:00 AM  % Wound base Black/Eschar 20% 01/08/2017 11:00 AM  % Wound base Other/Granulation Tissue (Comment) 0% 01/08/2017 11:00 AM  Peri-wound Assessment Maceration;Induration 01/08/2017 11:00 AM  Wound Length (cm) 11 cm 01/06/2017  3:22 PM  Wound Width (cm) 15 cm 01/06/2017  3:22 PM  Wound Depth (cm) 6 cm 01/06/2017  3:22 PM  Drainage Amount Moderate 01/08/2017 11:00 AM  Drainage Description Purulent;Serosanguineous 01/08/2017 11:00 AM  Treatment Debridement (Selective);Hydrotherapy (Pulse lavage);Packing (Saline gauze) 01/08/2017 11:00 AM     Pressure Injury 12/31/16 Stage II -  Partial thickness loss of dermis presenting as a shallow open ulcer with a red, pink wound bed without slough. per SNF present at the last time wraps changed. (Active)  Dressing Type Other (Comment) 01/06/2017  8:08 PM  Site / Wound Assessment Other (Comment)  01/06/2017  8:08 PM  Wound Length (cm) 4 cm 12/31/2016  3:00 PM  Wound Width (cm) 4.5 cm 12/31/2016  3:00 PM  Wound Depth (cm) 0 cm 12/31/2016  3:00 PM  Drainage Amount None 01/06/2017  8:08 PM  Treatment Other (Comment) 12/31/2016  3:00 PM     Incision (Closed) 01/02/17 Sacrum Other (Comment) (Active)  Dressing Type Gauze (Comment);ABD 01/08/2017  6:16 AM  Dressing Clean;Dry;Intact 01/08/2017  6:16 AM  Dressing Change Frequency Twice a day 01/08/2017  6:16 AM  Site / Wound Assessment Dressing in place / Unable to assess 01/07/2017 10:30 AM  Drainage Amount Moderate 01/06/2017  8:08 PM  Treatment Cleansed;Other (Comment) 01/06/2017 10:15 AM   Hydrotherapy Pulsed lavage therapy - wound location: Sacrum Pulsed Lavage with Suction (psi): 12 psi Pulsed Lavage with Suction - Normal Saline Used: 1000 mL Pulsed Lavage Tip: Tip with splash shield Selective Debridement Selective Debridement - Location: Sacrum Selective Debridement - Tools Used: Forceps;Scissors Selective Debridement - Tissue Removed: Yellow and black necrotic tissue   Wound Assessment and Plan  Wound Therapy - Assess/Plan/Recommendations Wound Therapy - Clinical Statement: Surgery PA present at beginning of session to assess wound.  D/C Dakins and change dressings to Xeroform to superficial area on pt's L and Saline W-D packing to the rest of the wound.   Wound Therapy - Functional Problem List: Decreased tolerance for OOB, bed mobility; acute pain Factors Delaying/Impairing Wound Healing: Altered sensation;Immobility;Multiple medical problems;Vascular compromise Hydrotherapy Plan: Debridement;Dressing change;Patient/family education;Pulsatile lavage with suction Wound Therapy - Frequency: 6X / week Wound Therapy - Follow Up Recommendations: Skilled nursing facility Wound Plan: See above  Wound Therapy Goals- Improve  the function of patient's integumentary system by progressing the wound(s) through the phases of wound healing  (inflammation - proliferation - remodeling) by: Decrease Necrotic Tissue to: 25% Decrease Necrotic Tissue - Progress: Progressing toward goal Increase Granulation Tissue to: 75% Increase Granulation Tissue - Progress: Progressing toward goal Goals/treatment plan/discharge plan were made with and agreed upon by patient/family: Yes Time For Goal Achievement: 7 days Wound Therapy - Potential for Goals: Good  Goals will be updated until maximal potential achieved or discharge criteria met.  Discharge criteria: when goals achieved, discharge from hospital, MD decision/surgical intervention, no progress towards goals, refusal/missing three consecutive treatments without notification or medical reason.  Tolu, PT [phone] 01/08/2017, 12:02 PM

## 2017-01-08 NOTE — Progress Notes (Signed)
Pharmacy Antibiotic Note  Jesus [name] is a 71 y.o. male admitted on 12/29/2016 with sacral wound infection.  Pharmacy has been consulted for Vancomycin & Zosyn dosing.   vt - 16  Plan: 1. Continue Vancomycin to 1g IV every 24 hours. 2. Continue Zosyn 3.375g IV every 8 hours (infused over 4 hours) 3. Will continue to follow renal function, culture results, LOT, and antibiotic de-escalation plans   Height: 5\' 10"  (177.8 cm) Weight: 264 lb 3.2 oz (119.8 kg) IBW/kg (Calculated) : 73  Temp (24hrs), Avg:97.9 F (36.6 C), Min:97.4 F (36.3 C), Max:98.6 F (37 C)   Recent Labs Lab 01/03/17 0515 01/05/17 0509 01/05/17 0714 01/06/17 0652 01/07/17 0644 01/08/17 0629 01/08/17 1925  WBC 11.9* 10.5  --  13.1* 10.7* 9.4  --   CREATININE 1.16 1.19  --  1.21 1.10 1.08  --   VANCOTROUGH  --   --  36*  --   --   --  16    Estimated Creatinine Clearance: 82.5 mL/min (by C-G formula based on SCr of 1.08 mg/dL).    Allergies  Allergen Reactions  . Amlodipine Besylate Swelling and Other (See Comments)    "started off low and ended up severe; if, in fact, that is what's causing the swelling" (06/03/12)  . Levaquin [Levofloxacin In D5w] Rash   Vanc 1/22>> Zosyn 1/23>>  Dose adjustments this admission:  1/29 AM VT = 36 (drawn correctly) - dose adjusted to 1gm IV q24h  Microbiology results:  1/22 BCx: no growth F 1/26 MRSA PCR >> positive  Jesus [name], PharmD, BCPS, BCCCP Clinical Pharmacist 01/08/2017 8:51 PM

## 2017-01-08 NOTE — Consult Note (Signed)
Consultation Note Date: 01/08/2017   Patient Name: Jesus [name]  DOB: May 13, 1946  MRN: [id number]  Age / Sex: 71 y.o., male  PCP: Gerre PebblesSally [name], [name] Referring Physician: Clydia LlanoMutaz [name], MD  Reason for Consultation: Establishing goals of care and Psychosocial/spiritual support  HPI/Patient Profile: 71 y.o. male   admitted on 12/29/2016 with a past  medical history significant of A.Fib, HLD, HTN, sacral decubitus ulcer.   Patient pres[facility] with c/o confusion and elevated WBC today on routine labs.  ED Course: Work up in the ED is significant for an unstageable sacral decubitus ulcer.  Family today reports slow physical, functional and cognitive decline over the past year, worsening around Thanksgiving.  They tell me he "is stubborn" and "does not make good decisions regarding his medical problems"  Today they see for the first time the significance of the wound and are "shocked"    He is eating very little, remains confused.  Family face advanced directive decisions and anticipatory care needs.  Clinical Assessment and Goals of Care:  This NP Lorinda CreedMary Larach reviewed medical records, received report from team, assessed the patient and then meet at the patient's bedside along with his two daughters [name]/HPOA, [name] and his sister/[name] to discuss diagnosis, prognosis, GOC, EOL wishes disposition and options.  We discussed difficult wound healing in the setting of poor nutrition, chronic disease, non-ambulatory status.  A detailed discussion was had today regarding advanced directives.  Concepts specific to code status, artifical feeding and hydration, continued IV antibiotics and rehospitalization was had.  The difference between a aggressive medical intervention path  and a palliative comfort care path for this patient at this time was had.    Values and goals of care important to patient and family were attempted to be elicited.  "He would not want to live like this"  MOST form introduced.  Hard choices left for review  Concept of Hospice and Palliative Care were discussed  Natural trajectory and expectations at EOL were discussed.  Questions and concerns addressed.   Family encouraged to call with questions or concerns.  PMT will continue to support holistically.   HCPOA- daughter [name] however family work together as a whole    SUMMARY OF RECOMMENDATIONS    Code Status/Advance Care Planning:  DNR-documented today     Copy of Living Will added to hard chart   Palliative Prophylaxis:   Aspiration, Bowel Regimen, Delirium Protocol, Frequent Pain Assessment and Oral Care  Additional Recommendations (Limitations, Scope, Preferences):  At this time family desire continued treatments to treat the treatable.  They are hopeful for improvement.  They request a few days to discuss with other family members before making decisions shifting to a more comfort path.     I will re-meet with this family on Sunday morning at 1000  Psycho-social/Spiritual:   Desire for further Chaplaincy support:no  Additional Recommendations: Education on Hospice  Prognosis:   Dependant on desire for life prolonging measures  Discharge Planning: To Be Determined      Primary Diagnoses: Present on Admission: . Decubitus ulcer of sacral region, unstageable (HCC) . Acute on chronic renal failure (HCC) . Atrial fibrillation (HCC) . Delirium . (Resolved) Altered mental status . Chronic diastolic CHF (congestive heart failure) (HCC) . Chronic venous insufficiency   I have reviewed the medical record, interviewed the patient and family, and examined the patient. The following aspects are pertinent.  Past Medical History:  Diagnosis Date  . Acute on chronic renal failure (HCC)   . Arthritis    "normal for my age" (11/13/2016)  .  Cellulitis and abscess of leg 11/2016  . Chronic anemia   . Chronic atrial fibrillation (HCC)   . Chronic diastolic CHF (congestive heart failure) (HCC) 12/2002   a. EF 50-55% by echo 04/2012  . Decubitus ulcer of buttock, unstageable (HCC) 12/31/2016  . Edema of lower extremity   . Gout   . H/O: GI bleed    "cause I took too much aspirin"  . High cholesterol   . Hyperglycemia    Noted 05/2012  . Hypertension   . Morbid obesity (HCC)   . PVD (peripheral vascular disease) (HCC)   . Venous stasis ulcer (HCC)    Social History   Social History  . Marital status: Divorced    Spouse name: N/A  . Number of children: N/A  . Years of education: N/A   Occupational History  . Retired    Social History Main Topics  . Smoking status: Former Smoker    Types: Cigarettes    Quit date: 12/08/1965  . Smokeless tobacco: Never Used     Comment: "social cigarette smoker in my late teens"  . Alcohol use 8.4 oz/week    14 Cans of beer per week  . Drug use: No  . Sexual activity: No   Other Topics Concern  . None   Social History Narrative   Patient lives alone.   Family History  Problem Relation Age of Onset  . Arrhythmia Father   . Hypertension Father   . Cancer Mother     cervical   Scheduled Meds: . atorvastatin  80 mg Oral q1800  . carvedilol  6.25 mg Oral BID WC  . Chlorhexidine Gluconate Cloth  6 each Topical Q0600  . divalproex  500 mg Oral QHS  . feeding supplement (ENSURE ENLIVE)  237 mL Oral BID BM  . feeding supplement (PRO-STAT SUGAR FREE 64)  30 mL Oral Daily  . LORazepam  1 mg Intravenous Once  . mouth rinse  15 mL Mouth Rinse BID  . mupirocin ointment  1 application Nasal BID  . pantoprazole sodium  40 mg Oral BID  . piperacillin-tazobactam (ZOSYN)  IV  3.375 g Intravenous Q8H  . QUEtiapine  12.5 mg Oral BID  . saccharomyces boulardii  250 mg Oral BID  . thiamine  100 mg Oral Daily  . torsemide  20 mg Oral Daily  . vancomycin  1,000 mg Intravenous Q24H    Continuous Infusions: PRN Meds:.acetaminophen, HYDROcodone-acetaminophen, HYDROmorphone (DILAUDID) injection, hydrOXYzine Medications Prior to Admission:  Prior to Admission medications   Medication Sig Start Date End Date Taking? Authorizing Provider  acetaminophen (TYLENOL) 500 MG tablet Take 500 mg by mouth every 8 (eight) hours as needed for moderate pain (Pain score 4-6/10).   Yes Historical Provider, MD  Amino Acids-Protein Hydrolys (FEEDING SUPPLEMENT, PRO-STAT SUGAR FREE 64,) LIQD Take 30 mLs by mouth 2 (two) times daily. 11/28/16  Yes  Joseph Art, DO  apixaban (ELIQUIS) 5 MG TABS tablet Take 1 tablet (5 mg total) by mouth 2 (two) times daily. 07/18/15  Yes Shanker Levora Dredge, MD  atorvastatin (LIPITOR) 80 MG tablet Take 1 tablet (80 mg total) by mouth daily. 07/18/15  Yes Shanker Levora Dredge, MD  carvedilol (COREG) 6.25 MG tablet Take 1 tablet (6.25 mg total) by mouth 2 (two) times daily with a meal. 07/18/15  Yes Shanker Levora Dredge, MD  divalproex (DEPAKOTE ER) 500 MG 24 hr tablet Take 500 mg by mouth at bedtime.   Yes Historical Provider, MD  HYDROcodone-acetaminophen (NORCO/VICODIN) 5-325 MG tablet Take 1 tablet by mouth. Give 1 tablet scheduled at 9AM and 1 tablet q6h prn for severe pain scale of 7/10.   Yes Historical Provider, MD  hydrOXYzine (ATARAX/VISTARIL) 25 MG tablet Take 1 tablet (25 mg total) by mouth 3 (three) times daily as needed (for itching). Patient taking differently: Take 25 mg by mouth every 8 (eight) hours as needed for itching (for itching).  11/28/16  Yes Joseph Art, DO  Multiple Vitamins-Minerals (DECUBI-VITE PO) Take 1 capsule by mouth daily.   Yes Historical Provider, MD  pantoprazole (PROTONIX) 40 MG tablet Take 1 tablet (40 mg total) by mouth 2 (two) times daily. 11/28/16  Yes Joseph Art, DO  QUEtiapine (SEROQUEL) 25 MG tablet Take 25 mg by mouth 2 (two) times daily.   Yes Historical Provider, MD  saccharomyces boulardii (FLORASTOR) 250 MG capsule Take  250 mg by mouth 2 (two) times daily. x13 days 12/18/16  Yes Historical Provider, MD  spironolactone (ALDACTONE) 25 MG tablet Take 1 tablet (25 mg total) by mouth daily. Start 8/11 07/19/15  Yes Shanker Levora Dredge, MD  torsemide (DEMADEX) 100 MG tablet Take 1 tablet (100 mg total) by mouth 2 (two) times daily. 11/28/16  Yes Joseph Art, DO  divalproex (DEPAKOTE SPRINKLE) 125 MG capsule Take 125 mg by mouth 2 (two) times daily. 12/18/16 12/20/16  Historical Provider, MD   Allergies  Allergen Reactions  . Amlodipine Besylate Swelling and Other (See Comments)    "started off low and ended up severe; if, in fact, that is what's causing the swelling" (06/03/12)  . Levaquin [Levofloxacin In D5w] Rash   Review of Systems  Unable to perform ROS: Mental status change    Physical Exam  Constitutional: He appears well-developed. He appears ill.  -large sacral decubitus ulcer as documented in EMR   HENT:  Mouth/Throat: Mucous membranes are dry. Abnormal dentition.  Cardiovascular: Normal rate, regular rhythm and normal heart sounds.   Pulmonary/Chest: He has decreased breath sounds in the right lower field and the left lower field.  Musculoskeletal:  -generalized weakness  -BLE +1 edema and leg wraps    Vital Signs: BP 134/69 (BP Location: Left Arm)   Pulse 67   Temp 97.4 F (36.3 C) (Oral)   Resp 18   Ht 5\' 10"  (1.778 m)   Wt 119.8 kg (264 lb 3.2 oz)   SpO2 95%   BMI 37.91 kg/m  Pain Assessment: 0-10 POSS *See Group Information*: S-Acceptable,Sleep, easy to arouse Pain Score: 10-Worst pain ever   SpO2: SpO2: 95 % O2 Device:SpO2: 95 % O2 Flow Rate: .O2 Flow Rate (L/min): 2 L/min  IO: Intake/output summary:  Intake/Output Summary (Last 24 hours) at 01/08/17 0846 Last data filed at 01/08/17 0800  Gross per 24 hour  Intake              920 ml  Output              550 ml  Net              370 ml    LBM: Last BM Date:  (UTA ) Baseline Weight: Weight: 136.1 kg (300 lb) Most recent  weight: Weight: 119.8 kg (264 lb 3.2 oz)      Palliative Assessment/Data: 30 % at best   Flowsheet Rows   Flowsheet Row Most Recent Value  Intake Tab  Referral Department  Hospitalist  Unit at Time of Referral  Med/Surg Unit  Palliative Care Primary Diagnosis  Cardiac  Date Notified  01/06/17  Palliative Care Type  New Palliative care  Reason for referral  Clarify Goals of Care  Date of Admission  12/29/16  # of days IP prior to Palliative referral  8  Clinical Assessment  Psychosocial & Spiritual Assessment  Palliative Care Outcomes    Discussed with Dr Arthor Captain  Time In: 0900 Time Out: 1100 Time Total: 120 min  Greater than 50%  of this time was spent counseling and coordinating care related to the above assessment and plan.  Signed by: Lorinda Creed, NP   Please contact Palliative Medicine Team phone at [phone] for questions and concerns.  For individual provider: See Loretha Stapler

## 2017-01-08 NOTE — NC FL2 (Signed)
[place] MEDICAID FL2 LEVEL OF CARE SCREENING TOOL     IDENTIFICATION  Patient Name: Jesus [name] Birthdate: 1946/03/17 Sex: male Admission Date (Current Location): 12/29/2016  [facility] [place]   Facility and Address:  [facility], [address], [place], [place] [address]      Provider Number: [id number]  Attending Physician Name and Address:  Clydia LlanoMutaz [name], MD  Relative Name and Phone Number:  Deforge,[name]-Daughter-[phone]; Shela Nevinrevette,[name]-Son - [phone]      Current Level of Care: Hospital Recommended Level of Care: Skilled Nursing Facility [facility] Prior Approval Number:    Date Approved/Denied:   PASRR Number: [id number] (Eff. 11/26/16)  Discharge Plan: SNF    Current Diagnoses: Patient Active Problem List   Diagnosis Date Noted  . Decubitus ulcer of sacral region, unstageable (HCC) 12/30/2016  . Delirium 12/30/2016  . Cellulitis of lower extremity   . Hyponatremia 07/15/2015  . Cellulitis of right lower extremity 07/15/2015  . Acute on chronic renal failure (HCC)   . Hypokalemia   . Chronic atrial fibrillation (HCC) 07/09/2015  . Chronic anemia 07/09/2015  . CHF exacerbation (HCC) 07/08/2015  . Chronic diastolic CHF (congestive heart failure) (HCC) 04/02/2015  . Chronic diastolic heart failure (HCC) 01/25/2015  . Cellulitis 01/17/2015  . Pulmonary vascular congestion   . Congestive heart disease (HCC)   . Peripheral edema   . Acute exacerbation of CHF (congestive heart failure) (HCC) 01/04/2015  . 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 Renal artery stenosis (HCC) 02/17/2014  . Peripheral vascular disease (HCC) 04/15/2013  . Chronic venous insufficiency 04/15/2013  . Morbid obesity (HCC) 06/11/2012  . Acute on chronic diastolic CHF (congestive heart failure) (HCC)  06/02/2012  . Unspecified essential hypertension 05/18/2012  . Atrial fibrillation (HCC) 04/27/2012  . CHF (congestive heart failure) (HCC) 04/27/2012  . Hypertension 04/27/2012    Orientation RESPIRATION BLADDER Height & Weight      (Patient disoriented x4)  O2 (2 Liters oxygen) Continent (Urinary catheter) Weight: 264 lb 3.2 oz (119.8 kg) Height:  5\' 10"  (177.8 cm)  BEHAVIORAL SYMPTOMS/MOOD NEUROLOGICAL BOWEL NUTRITION STATUS      Incontinent Diet (DYS 2)  AMBULATORY STATUS COMMUNICATION OF NEEDS Skin   Extensive Assist (During 01/07/17 PT session patient too rigid and resistant to attempt standing ) Verbally Other (Comment) (MASD to gorin, scrotum, Perineum, bilateral thigh; Unstageable PU to sacrum, Stage 2 PU's to right/left heels, anterior foot; Deep tissue injury to right, proximal Pretibial and left proximal Pretibial; incision sacrum)                       Personal Care Assistance Level of Assistance  Bathing, Feeding, Dressing Bathing Assistance: Maximum assistance Feeding assistance: Limited assistance Dressing Assistance: Maximum assistance     Functional Limitations Info  Sight, Hearing, Speech Sight Info: Adequate Hearing Info: Adequate Speech Info: Adequate    SPECIAL CARE FACTORS FREQUENCY  PT (By licensed PT), Speech therapy     PT Frequency: Evaluated 1/29 and a minimum of 3X per week therapy recommended       Speech Therapy Frequency: Swallow eval 01/01/17      Contractures Contractures Info: Not present    Additional Factors Info  Code Status, Allergies Code Status Info: Full Allergies Info: Amlodipine Besylate, Levaquin           Current Medications (01/08/2017):  This is the current hospital active medication list Current Facility-Administered Medications  Medication Dose Route Frequency Provider Last Rate Last Dose  . acetaminophen (TYLENOL) tablet 650 mg  650 mg Oral Q6H PRN Hillary Bow, DO      . apixaban (ELIQUIS) tablet 5 mg  5  mg Oral BID Clydia Llano, MD   5 mg at 01/08/17 1058  . atorvastatin (LIPITOR) tablet 80 mg  80 mg Oral q1800 Hillary Bow, DO   80 mg at 01/07/17 1818  . carvedilol (COREG) tablet 6.25 mg  6.25 mg Oral BID WC Hillary Bow, DO   6.25 mg at 01/08/17 1610  . Chlorhexidine Gluconate Cloth 2 % PADS 6 each  6 each Topical Q0600 Osvaldo Shipper, MD   6 each at 01/07/17 [phone]  . divalproex (DEPAKOTE SPRINKLE) capsule 500 mg  500 mg Oral QHS Roma Kayser Schorr, NP   500 mg at 01/08/17 0145  . feeding supplement (ENSURE ENLIVE) (ENSURE ENLIVE) liquid 237 mL  237 mL Oral BID BM Osvaldo Shipper, MD   237 mL at 01/08/17 1058  . feeding supplement (PRO-STAT SUGAR FREE 64) liquid 30 mL  30 mL Oral Daily Osvaldo Shipper, MD   30 mL at 01/08/17 1058  . HYDROcodone-acetaminophen (NORCO/VICODIN) 5-325 MG per tablet 1 tablet  1 tablet Oral Q6H PRN Hillary Bow, DO   1 tablet at 01/01/17 2042  . HYDROmorphone (DILAUDID) injection 1-2 mg  1-2 mg Intravenous Q2H PRN Axel Filler, MD   1 mg at 01/08/17 5409  . hydrOXYzine (ATARAX/VISTARIL) tablet 25 mg  25 mg Oral TID PRN Hillary Bow, DO   25 mg at 01/04/17 2259  . LORazepam (ATIVAN) injection 1 mg  1 mg Intravenous Once Leanne Chang, NP      . MEDLINE mouth rinse  15 mL Mouth Rinse BID Osvaldo Shipper, MD   15 mL at 01/08/17 0051  . mupirocin ointment (BACTROBAN) 2 % 1 application  1 application Nasal BID Osvaldo Shipper, MD   1 application at 01/07/17 2301  . pantoprazole sodium (PROTONIX) 40 mg/20 mL oral suspension 40 mg  40 mg Oral BID Roma Kayser Schorr, NP   40 mg at 01/08/17 1058  . piperacillin-tazobactam (ZOSYN) IVPB 3.375 g  3.375 g Intravenous Q8H Lauren D Bajbus, RPH 12.5 mL/hr at 01/08/17 0531 3.375 g at 01/08/17 0531  . QUEtiapine (SEROQUEL) tablet 12.5 mg  12.5 mg Oral BID Osvaldo Shipper, MD   12.5 mg at 01/08/17 1059  . saccharomyces boulardii (FLORASTOR) capsule 250 mg  250 mg Oral BID Osvaldo Shipper, MD   250 mg at 01/08/17 1058  .  thiamine (VITAMIN B-1) tablet 100 mg  100 mg Oral Daily Osvaldo Shipper, MD   100 mg at 01/08/17 1058  . torsemide (DEMADEX) tablet 20 mg  20 mg Oral Daily Clydia Llano, MD   20 mg at 01/08/17 1058  . vancomycin (VANCOCIN) IVPB 1000 mg/200 mL premix  1,000 mg Intravenous Q24H Lauren D Bajbus, RPH   1,000 mg at 01/07/17 2301     Discharge Medications: Please see discharge summary for a list of discharge medications.  Relevant Imaging Results:  Relevant Lab Results:   Additional Information ss#[id number].  SKIN ISSUES: Moisture Associated Skin Damage to goin, scrotum, Perineum, bilateral thigh, treated with barrier cream;  Unstageable pressure ulcer treated with barrier film, guaze dressing changed twice daily, with moderate drainage.Okey Dupre, Lazaro Arms, LCSW

## 2017-01-08 NOTE — Progress Notes (Signed)
Nutrition Follow-up  DOCUMENTATION CODES:   Morbid obesity  INTERVENTION:  Provide 30 ml Prostat po TID, each supplement provides 100 kcal and 15 grams of protein.   Provide Ensure Enlive po TID, each supplement provides 350 kcal and 20 grams of protein.  Encourage adequate PO intake.   NUTRITION DIAGNOSIS:   Increased nutrient needs related to wound healing as evidenced by estimated needs; ongoing  GOAL:   Patient will meet greater than or equal to 90% of their needs; progressing  MONITOR:   PO intake, Supplement acceptance, Labs, Weight trends, Skin, I & O's  REASON FOR ASSESSMENT:   Low Braden    ASSESSMENT:   71 y.o. male with medical history significant of A.Fib, HLD, HTN, sacral decubitus ulcer. [facility] with c/o confusion and elevated WBC today on routine labs. Pt is confused at baseline according to the nursing staff. He does say his bottom hurts and he thinks he has had a wound for 4-5 months. PMH CKD3, CHF. Pt also presents with AKI on CKD3. Surgical debridement 1/26. Pt undergoing hydrotherapy.  Meal completion has been 10-25%. Per MD note, pt with generalized deconditioning and palliative care has been consulted to discuss goals of care with pt and family. RD to increase nutritional supplements to aid in caloric and protein needs as well as in wound healing. Will continue to monitor.   Labs and medications reviewed.   Diet Order:  DIET DYS 2 Room service appropriate? Yes; Fluid consistency: Thin  Skin:  Wound (see comment) (Unstageable to sacrum, stg 2 to heels)  Last BM:  1/29  Height:   Ht Readings from Last 1 Encounters:  12/29/16 5\' 10"  (1.778 m)    Weight:   Wt Readings from Last 1 Encounters:  01/06/17 264 lb 3.2 oz (119.8 kg)    Ideal Body Weight:  75.45 kg  BMI:  Body mass index is 37.91 kg/m.  Estimated Nutritional Needs:   Kcal:  2000-2300  Protein:  120-140 grams  Fluid:   2-2.3 L/day  EDUCATION NEEDS:   No education needs identified at this time  Roslyn SmilingStephanie Kharter Brew, MS, RD, LDN Pager # [phone] After hours/ weekend pager # [phone]

## 2017-01-09 DIAGNOSIS — Z66 Do not resuscitate: Secondary | ICD-10-CM

## 2017-01-09 NOTE — Care Management Important Message (Signed)
Important Message  Patient Details  Name: Jesus [name] MRN: [id number] Date of Birth: July 17, 1946   Medicare Important Message Given:  Yes    Lawerance Sabalebbie Zamiya Dillard, RN 01/09/2017, 10:38 AM

## 2017-01-09 NOTE — Clinical Social Work Note (Addendum)
CSW talked with Rollene Fare, admissions director with [facility] on 2/1 and she will initiate authorization when patient medically stable for discharge. Voice mail left for Rollene Fare today advising her that patient not ready for d/c. Talked with Rollene Fare [facility]) regarding patient and was advised that patient owes $5,800 to facility. She will confirm amount and get back with CSW. Daughter at hospital to visit with patient (2:21 pm) and have MD complete needed paperwork and was informed of information provided to CSW from [facility] regarding what patient owes facility.   Palliative following and met with family on 01/08/17 to discuss goals of care. Wadie Lessen, NP, will re-meet with family on Sunday morning at 10:00 am.  CSW will continue to follow patient's progress, readiness for discharge, decision on discharge disposition and assist as needed.  Arcenia Scarbro Givens, MSW, LCSW Licensed Clinical Social Worker [place] [phone]

## 2017-01-09 NOTE — Progress Notes (Signed)
Physical Therapy Treatment Patient Details Name: Jesus [name] MRN: [id number] DOB: 1946-02-04 Today's Date: 01/09/2017    History of Present Illness  Jesus [name] is a 71 y.o. male with medical history significant of A.Fib, HLD, HTN, sacral decubitus ulcer.  Patient presents to the ED [facility] with c/o confusion and elevated WBC today on routine labs.      PT Comments    Pt LE pain very limiting, but after multiple sessions of rolling for pericare, pt agreed to sit EOB and attempt standing in STEDY with elevated bed.  Follow Up Recommendations  SNF     Equipment Recommendations  Other (comment) (TBA)    Recommendations for Other Services       Precautions / Restrictions Precautions Precautions: Fall    Mobility  Bed Mobility Overal bed mobility: Needs Assistance Bed Mobility: Rolling;Sidelying to Sit;Sit to Sidelying Rolling: Max assist;+2 for physical assistance;+2 for safety/equipment Sidelying to sit: Total assist;+2 for physical assistance     Sit to sidelying: Total assist;+2 for physical assistance General bed mobility comments: After therapist got pt calmed down, pt did attempt to help to EOB  Transfers Overall transfer level: Needs assistance   Transfers: Sit to/from Stand Sit to Stand: Total assist;+2 physical assistance         General transfer comment: pt able to clear him bottom 1 time out of 3 attempts.  Ambulation/Gait             General Gait Details: NA   Stairs            Wheelchair Mobility    Modified Rankin (Stroke Patients Only)       Balance Overall balance assessment: Needs assistance Sitting-balance support: Bilateral upper extremity supported;Feet supported Sitting balance-Leahy Scale: Poor Sitting balance - Comments: sat EOB x 8 min with UE assist and guarding.                            Cognition Arousal/Alertness: Awake/alert Behavior During Therapy:  Anxious Overall Cognitive Status: Impaired/Different from baseline                      Exercises      General Comments        Pertinent Vitals/Pain Pain Assessment: Faces Faces Pain Scale: Hurts whole lot Pain Location: knees, R Leg Pain Descriptors / Indicators: Moaning;Grimacing;Discomfort;Sore;Shooting Pain Intervention(s): Limited activity within patient's tolerance;Monitored during session;Repositioned;Premedicated before session    Home Living                      Prior Function            PT Goals (current goals can now be found in the care plan section) Acute Rehab PT Goals Patient Stated Goal: continue rehab at nursing home PT Goal Formulation: With patient Time For Goal Achievement: 01/19/17 Potential to Achieve Goals: Fair Progress towards PT goals: Progressing toward goals    Frequency    Min 3X/week      PT Plan Current plan remains appropriate    Co-evaluation             End of Session   Activity Tolerance: Patient tolerated treatment well Patient left: in bed;with call bell/phone within reach;with bed alarm set     Time: [id number] PT Time Calculation (min) (ACUTE ONLY): 64 min  Charges:  $Therapeutic Activity: 53-67 mins  G CodesEliseo Gum Rhyland Hinderliter 01/09/2017, 4:04 PM 01/09/2017  [place] Bing, PT [phone]  (pager)

## 2017-01-09 NOTE — Progress Notes (Signed)
SLP Cancellation Note  Patient Details Name: Jesus [name] MRN: [id number] DOB: 22-Sep-1946   Cancelled treatment:        PT currently working with pt. Will continue efforts.   Royce MacadamiaLitaker, Bettyanne Dittman Willis 01/09/2017, 2:35 PM  Breck CoonsLisa Willis Lonell FaceLitaker M.Ed [employer] Pager [phone]

## 2017-01-09 NOTE — Progress Notes (Signed)
7 Days Post-Op  Subjective: Not very tolerant of being moved around.  Objective: Vital signs in last 24 hours: Temp:  [97.8 F (36.6 C)-98.3 F (36.8 C)] 98.3 F (36.8 C) (02/02 0924) Pulse Rate:  [67-83] 71 (02/02 0924) Resp:  [18-20] 18 (02/02 0924) BP: (114-172)/(70-85) 172/78 (02/02 0924) SpO2:  [98 %-100 %] 99 % (02/02 0924) FiO2 (%):  [2 %] 2 % (02/02 0454) Weight:  [129.7 kg (286 lb)] 129.7 kg (286 lb) (02/01 2100) Last BM Date: 01/05/17  Intake/Output from previous day: 02/01 0701 - 02/02 0700 In: 1100 [P.O.:1100] Out: 1221 [Urine:1220; Stool:1] Intake/Output this shift: No intake/output data recorded.   Wound on 01/05/17   Wound on 01/09/17 with [name] therapy thru the week.  It is much cleaner.      Lab Results:   Recent Labs  01/07/17 0644 01/08/17 0629  WBC 10.7* 9.4  HGB 8.6* 8.4*  HCT 28.4* 27.1*  PLT 224 244    BMET  Recent Labs  01/07/17 0644 01/08/17 0629  NA 142 141  K 3.7 3.8  CL 105 104  CO2 29 30  GLUCOSE 108* 122*  BUN 26* 27*  CREATININE 1.10 1.08  CALCIUM 9.1 9.2   PT/INR No results for input(s): LABPROT, INR in the last 72 hours.  No results for input(s): AST, ALT, ALKPHOS, BILITOT, PROT, ALBUMIN in the last 168 hours.   Lipase     Component Value Date/Time   LIPASE 27 12/29/2016 1916     Studies/Results: No results found.  Medications: . apixaban  5 mg Oral BID  . atorvastatin  80 mg Oral q1800  . carvedilol  6.25 mg Oral BID WC  . Chlorhexidine Gluconate Cloth  6 each Topical Q0600  . divalproex  500 mg Oral QHS  . feeding supplement (ENSURE ENLIVE)  237 mL Oral TID BM  . feeding supplement (PRO-STAT SUGAR FREE 64)  30 mL Oral TID  . LORazepam  1 mg Intravenous Once  . mouth rinse  15 mL Mouth Rinse BID  . mupirocin ointment  1 application Nasal BID  . pantoprazole sodium  40 mg Oral BID  . piperacillin-tazobactam (ZOSYN)  IV  3.375 g Intravenous Q8H  . QUEtiapine  12.5 mg Oral BID  . saccharomyces  boulardii  250 mg Oral BID  . thiamine  100 mg Oral Daily  . torsemide  20 mg Oral Daily  . vancomycin  1,000 mg Intravenous Q24H    Assessment/Plan Unstageable decubitus S/p DEBRIDMENT OF DECUBITUS ULCER 11 x 14 x 6 cm, 01/02/17, Dr. 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  The hydrotherapy is working.  Long term, he well need continued hydrotherapy, we may look at trying a wound vac.  Nutrition and Mental status will also play a big role in his recovery from this.  We will look at the sites again on Monday with hydrotherapy.     LOS: 10 days    Jesus [name] 01/09/2017 [phone]

## 2017-01-09 NOTE — Progress Notes (Addendum)
Physical Therapy Wound Treatment Patient Details  Name: SEVAN MCBROOM MRN: [id number] Date of Birth: 1946/01/11  Today's Date: 01/09/2017 Time: 6314-9702 Time Calculation (min): 56 min  Subjective  Subjective: Pt remains talkative but continues to jump from topic to topic.   Patient and Family Stated Goals: Pt did not state goals during session Date of Onset:  (chronic) Prior Treatments: I&D 01/02/17  Pain Score:  8/10  Wound Assessment  Pressure Injury 12/29/16 Unstageable - Full thickness tissue loss in which the base of the ulcer is covered by slough (yellow, tan, gray, green or brown) and/or eschar (tan, brown or black) in the wound bed. (Active)  Dressing Type ABD;Barrier Film (skin prep);Moist to dry;Gauze (Comment) 01/09/2017  2:12 PM  Dressing Changed 01/09/2017  2:12 PM  Dressing Change Frequency Daily 01/09/2017  2:12 PM  State of Healing Early/partial granulation 01/09/2017  2:12 PM  Site / Wound Assessment Red;Yellow;Bleeding;Black 01/09/2017  2:12 PM  % Wound base Red or Granulating 40% 01/09/2017  2:12 PM  % Wound base Yellow/Fibrinous Exudate 40% 01/09/2017  2:12 PM  % Wound base Black/Eschar 20% 01/09/2017  2:12 PM  % Wound base Other/Granulation Tissue (Comment) 0% 01/09/2017  2:12 PM  Peri-wound Assessment Maceration;Induration 01/09/2017  2:12 PM  Wound Length (cm) 11 cm 01/06/2017  3:22 PM  Wound Width (cm) 15 cm 01/06/2017  3:22 PM  Wound Depth (cm) 6 cm 01/06/2017  3:22 PM  Drainage Amount Moderate 01/09/2017  2:12 PM  Drainage Description Purulent;Serosanguineous 01/09/2017  2:12 PM  Treatment Debridement (Selective);Hydrotherapy (Pulse lavage);Packing (Saline gauze) 01/08/2017 11:00 AM     Pressure Injury 12/31/16 Stage II -  Partial thickness loss of dermis presenting as a shallow open ulcer with a red, pink wound bed without slough. per SNF present at the last time wraps changed. (Active)  Dressing Type Other (Comment) 01/06/2017  8:08 PM  Site / Wound Assessment Other (Comment)  01/06/2017  8:08 PM  Wound Length (cm) 4 cm 12/31/2016  3:00 PM  Wound Width (cm) 4.5 cm 12/31/2016  3:00 PM  Wound Depth (cm) 0 cm 12/31/2016  3:00 PM  Drainage Amount None 01/06/2017  8:08 PM  Treatment Other (Comment) 12/31/2016  3:00 PM     Incision (Closed) 01/02/17 Sacrum Other (Comment) (Active)  Dressing Type Gauze (Comment);ABD 01/08/2017  8:56 PM  Dressing Clean;Dry;Intact 01/08/2017  8:56 PM  Dressing Change Frequency Twice a day 01/08/2017  8:56 PM  Site / Wound Assessment Dressing in place / Unable to assess 01/07/2017 10:30 AM  Drainage Amount Moderate 01/06/2017  8:08 PM  Treatment Cleansed;Other (Comment) 01/06/2017 10:15 AM    Hydrotherapy Pulsed lavage therapy - wound location: Sacrum Pulsed Lavage with Suction (psi): 12 psi Pulsed Lavage with Suction - Normal Saline Used: 1000 mL Pulsed Lavage Tip: Tip with splash shield Selective Debridement Selective Debridement - Location: Sacrum Selective Debridement - Tools Used: Forceps;Scissors Selective Debridement - Tissue Removed: Yellow and black necrotic tissue   Wound Assessment and Plan  Wound Therapy - Assess/Plan/Recommendations Wound Therapy - Clinical Statement: Surgery PA present and [place] nurse to observe changes in wound from multiple hydro treatments. Dressings are now changed to Xeroform to superficial area on pt's L and Saline W-D packing to the rest of the wound.  pt appears to be in more pain during today's treatment and presents with multiple BM during session requiring increased time in side lying and multiple bouts of rolling to improve cleanliness and adhere dressing.   Wound Therapy - Functional Problem  List: Decreased tolerance for OOB, bed mobility; acute pain Factors Delaying/Impairing Wound Healing: Altered sensation;Immobility;Multiple medical problems;Vascular compromise Hydrotherapy Plan: Debridement;Dressing change;Patient/family education;Pulsatile lavage with suction Wound Therapy - Frequency: 6X /  week Wound Therapy - Follow Up Recommendations: Skilled nursing facility Wound Plan: See above  Wound Therapy Goals- Improve the function of patient's integumentary system by progressing the wound(s) through the phases of wound healing (inflammation - proliferation - remodeling) by: Decrease Necrotic Tissue - Progress: Progressing toward goal Increase Granulation Tissue to: 75% Increase Granulation Tissue - Progress: Progressing toward goal Goals/treatment plan/discharge plan were made with and agreed upon by patient/family: Yes Time For Goal Achievement: 7 days Wound Therapy - Potential for Goals: Good  Goals will be updated until maximal potential achieved or discharge criteria met.  Discharge criteria: when goals achieved, discharge from hospital, MD decision/surgical intervention, no progress towards goals, refusal/missing three consecutive treatments without notification or medical reason.  GP     Adlynn Lowenstein Eli Hose 01/09/2017, 2:21 PM Governor Rooks, PTA pager [phone]

## 2017-01-09 NOTE — Progress Notes (Signed)
PROGRESS NOTE  Jesus [name]  ZHY:[id number] DOB: 12-11-1945 DOA: 12/29/2016 PCP: Miki Kins Outpatient Specialists:  Subjective: No changes clinically, continues to be somnolent but easy to arouse, disoriented. No fever or chills.  Brief Narrative:  Jesus Heckendorn Prevetteis a 71 y.o.malewith medical history significant for A.Fib, HLD, HTN, sacral decubitus ulcer. Patient presented to the ED via EMS from [facility] with c/o confusion and elevated WBC on routine labs. Concern was for infection in the sacral decubitus. Patient was hospitalized for management. Patient was placed on broad-spectrum antibiotics. Anticoagulation was held. Patient underwent surgical debridement on 1/26. Patient also noted to have hepatic and pancreatic lesions noted incidentally on CT scan. MRI abdomen is pending. Patient, however, is not progressing. Son agreeable to palliative medicine consult.  Assessment & Plan:   Principal Problem:   Decubitus ulcer of sacral region, unstageable [facility]) Active Problems:   Atrial fibrillation (HCC)   Chronic venous insufficiency   Chronic diastolic CHF (congestive heart failure) (HCC)   Acute on chronic renal failure (HCC)   Delirium   DNR (do not resuscitate)   Palliative care by specialist   Infected Decubitus ulcer of sacral region -Large sacral decubitus ulcer, unstageable with concerns for infection.  -CT scan did show some air pockets without any evidence for abscess. Started on vancomycin and Zosyn. -Wound care was consulted. Subsequently, general surgery was consulted. Patient's anticoagulation was held.  -Patient underwent surgical debridement 1/26. Patient was placed on vancomycin and Zosyn, which is being continued.  -Continue hydrotherapy, continue antibiotics for now. -I agree with the general surgery, his mental and nutritional status is good and determine if he is getting better or not. -Palliative that with the family, DNR for now  but they want to continue to treat and see how he does.  Acute encephalopathy in the setting of questionable dementia Some of this is due to baseline cognitive impairment. According to patient's son, patient has had issues with memory for a few months. Patient was noted to be on Aricept a few months ago. Currently on Depakote and Seroquel. Mental status has been stable. Workup done here included B-12 level which was normal. TSH normal. RPR and HIV are nonreactive. EEG did not show any epileptiform activity. MRI brain was limited due to motion, however, did not show any acute findings. Atrophy was present. Continue thiamine considering history of alcohol use in the past. Depakote level is nontoxic. Patient's verbalization of wanting to hurt himself, was most likely secondary to pain. Seen by psychiatry.  Unclear to me why he has this disorientation,? Dementia.  A.Fib  -CHA2DS2-VASc score is 4. Rate is controlled with Coreg. Patient was on Eliquis, which has been held for surgery. -Resume when okay with surgery.  -Hemoglobin overall trend is going down, Eliquis restarted, continue to follow hemoglobin closely.  Normocytic anemia  -Hemoglobin noted to have drifted down, but stable. No overt bleeding has been noted. Bleeding noted from his wound.  Likely mild AKI on CKD stage 3/hypernatremia -Creatinine baseline 1.4, presented with creatinine 1.6 very close to baseline.  -Stop IV fluids restart diuresis.  Hypernatremia -Sodium is 154, back to normal    Chronic diastolic CHF  -Holding lasix and spironolactone for AKI. Seems euvolemic. Restarted Demadex.  Hepatic and pancreatic lesions -Cystic lesions incidentally noted on CT scan.  -MRI done and showed possible liver cirrhosis, pancreatic lesion does not look malignant, likely benign versus pseudocyst.  Chronic venous insufficiency  Stable.  Goals of care Patient also noted to  have generalized deconditioning. His quality of life  appears to be poor. His activity level and functional status is not known, but likely poor considering presence of decubitus ulcer. Patient may benefit from palliative medicine input for goals of care. Discussed in detail with patient's son today. Even if the MRI abdomen does not show any concerning findings, patient's general medical status and health has been declining. He has poor quality of life. He is agreeable to talk to palliative medicine for goals of care. Patient has other children including 2 daughters and a sister.   DVT prophylaxis: SCDs (was on Eliquis at home on hold because of previous procedure) Code Status: DNR Family Communication:  Disposition Plan:  Diet: DIET DYS 2 Room service appropriate? Yes; Fluid consistency: Thin  Consultants:   Gen. surgery  Procedures:   None  Antimicrobials:   Zosyn and vancomycin  Objective: Vitals:   01/08/17 1934 01/08/17 2100 01/09/17 0454 01/09/17 0924  BP:  (!) 143/73 (!) 170/85 (!) 172/78  Pulse: 71 83 67 71  Resp:  20 20 18   Temp:  98.1 F (36.7 C) 98 F (36.7 C) 98.3 F (36.8 C)  TempSrc:  Oral Oral Oral  SpO2:   100% 99%  Weight:  129.7 kg (286 lb)    Height:        Intake/Output Summary (Last 24 hours) at 01/09/17 1235 Last data filed at 01/09/17 0456  Gross per 24 hour  Intake              480 ml  Output              721 ml  Net             -241 ml   Filed Weights   01/05/17 2032 01/06/17 2118 01/08/17 2100  Weight: 119.3 kg (263 lb) 119.8 kg (264 lb 3.2 oz) 129.7 kg (286 lb)    Examination: General exam: Appears calm and comfortable  Respiratory system: Clear to auscultation. Respiratory effort normal. Cardiovascular system: S1 & S2 heard, RRR. No JVD, murmurs, rubs, gallops or clicks. No pedal edema. Gastrointestinal system: Abdomen is nondistended, soft and nontender. No organomegaly or masses felt. Normal bowel sounds heard. Central nervous system: Alert and oriented. No focal neurological  deficits. Extremities: Symmetric 5 x 5 power. Skin: No rashes, lesions or ulcers Psychiatry: Judgement and insight appear normal. Mood & affect appropriate.   Data Reviewed: I have personally reviewed following labs and imaging studies  CBC:  Recent Labs Lab 01/03/17 0515 01/05/17 0509 01/06/17 0652 01/07/17 0644 01/08/17 0629  WBC 11.9* 10.5 13.1* 10.7* 9.4  HGB 10.7* 8.9* 8.9* 8.6* 8.4*  HCT 35.5* 28.9* 29.2* 28.4* 27.1*  MCV 90.1 88.4 88.5 89.3 89.4  PLT 241 212 219 224 244   Basic Metabolic Panel:  Recent Labs Lab 01/03/17 0515 01/05/17 0509 01/06/17 0652 01/07/17 0644 01/08/17 0629  NA 149* 143 142 142 141  K 3.8 3.5 3.6 3.7 3.8  CL 112* 108 105 105 104  CO2 29 28 28 29 30   GLUCOSE 119* 114* 128* 108* 122*  BUN 28* 28* 26* 26* 27*  CREATININE 1.16 1.19 1.21 1.10 1.08  CALCIUM 9.5 9.3 9.1 9.1 9.2   GFR: Estimated Creatinine Clearance: 86.1 mL/min (by C-G formula based on SCr of 1.08 mg/dL). Liver Function Tests: No results for input(s): AST, ALT, ALKPHOS, BILITOT, PROT, ALBUMIN in the last 168 hours. No results for input(s): LIPASE, AMYLASE in the last 168 hours. No results for input(s):  AMMONIA in the last 168 hours. Coagulation Profile: No results for input(s): INR, PROTIME in the last 168 hours. Cardiac Enzymes: No results for input(s): CKTOTAL, CKMB, CKMBINDEX, TROPONINI in the last 168 hours. BNP (last 3 results) No results for input(s): PROBNP in the last 8760 hours. HbA1C: No results for input(s): HGBA1C in the last 72 hours. CBG: No results for input(s): GLUCAP in the last 168 hours. Lipid Profile: No results for input(s): CHOL, HDL, LDLCALC, TRIG, CHOLHDL, LDLDIRECT in the last 72 hours. Thyroid Function Tests: No results for input(s): TSH, T4TOTAL, FREET4, T3FREE, THYROIDAB in the last 72 hours. Anemia Panel: No results for input(s): VITAMINB12, FOLATE, FERRITIN, TIBC, IRON, RETICCTPCT in the last 72 hours. Urine analysis:    Component  Value Date/Time   COLORURINE YELLOW 12/29/2016 2053   APPEARANCEUR CLEAR 12/29/2016 2053   LABSPEC 1.009 12/29/2016 2053   PHURINE 6.0 12/29/2016 2053   GLUCOSEU NEGATIVE 12/29/2016 2053   HGBUR NEGATIVE 12/29/2016 2053   BILIRUBINUR NEGATIVE 12/29/2016 2053   KETONESUR NEGATIVE 12/29/2016 2053   PROTEINUR NEGATIVE 12/29/2016 2053   UROBILINOGEN 4.0 (H) 06/02/2012 1200   NITRITE NEGATIVE 12/29/2016 2053   LEUKOCYTESUR MODERATE (A) 12/29/2016 2053   Sepsis Labs: @LABRCNTIP (procalcitonin:4,lacticidven:4)  ) Recent Results (from the past 240 hour(s))  Surgical PCR screen     Status: Abnormal   Collection Time: 01/02/17  8:10 AM  Result Value Ref Range Status   MRSA, PCR POSITIVE (A) NEGATIVE Final    Comment: RESULT CALLED TO, READ BACK BY AND VERIFIED WITH: C. Peeler NT 11:05 01/02/17 ([email or web address])    Staphylococcus aureus POSITIVE (A) NEGATIVE Final    Comment:        The Xpert SA Assay (FDA approved for NASAL specimens in patients over 71 years of age), is one component of a comprehensive surveillance program.  Test performance has been validated by [facility] for patients greater than or equal to 71 year old. It is not intended to diagnose infection nor to guide or monitor treatment.      Invalid input(s): PROCALCITONIN, LACTICACIDVEN   Radiology Studies: No results found.      Scheduled Meds: . apixaban  5 mg Oral BID  . atorvastatin  80 mg Oral q1800  . carvedilol  6.25 mg Oral BID WC  . Chlorhexidine Gluconate Cloth  6 each Topical Q0600  . divalproex  500 mg Oral QHS  . feeding supplement (ENSURE ENLIVE)  237 mL Oral TID BM  . feeding supplement (PRO-STAT SUGAR FREE 64)  30 mL Oral TID  . LORazepam  1 mg Intravenous Once  . mouth rinse  15 mL Mouth Rinse BID  . mupirocin ointment  1 application Nasal BID  . pantoprazole sodium  40 mg Oral BID  . piperacillin-tazobactam (ZOSYN)  IV  3.375 g Intravenous Q8H  . QUEtiapine  12.5 mg Oral BID  .  saccharomyces boulardii  250 mg Oral BID  . thiamine  100 mg Oral Daily  . torsemide  20 mg Oral Daily  . vancomycin  1,000 mg Intravenous Q24H   Continuous Infusions:    LOS: 10 days    Time spent: 35 minutes    Bethaney Oshana A, MD [facility] Pager [phone]  If 7PM-7AM, please contact night-coverage [email or web address] Password [email or web address] 01/09/2017, 12:35 PM

## 2017-01-10 NOTE — Discharge Instructions (Signed)

## 2017-01-10 NOTE — Progress Notes (Signed)
PROGRESS NOTE  Jesus [name]  [id number] DOB: 03/09/1946 DOA: 12/29/2016 PCP: Miki Kins Outpatient Specialists:  Subjective: No changes clinically continues to be confused, disoriented. No other complaints or findings.  Brief Narrative:  Jesus Hart Prevetteis [name] 71 y.o.malewith medical history significant for [name].Fib, HLD, HTN, sacral decubitus ulcer. Patient presented to the ED via EMS from [facility] with c/o confusion and elevated WBC on routine labs. Concern was for infection in the sacral decubitus. Patient was hospitalized for management. Patient was placed on broad-spectrum antibiotics. Anticoagulation was held. Patient underwent surgical debridement on 1/26. Patient also noted to have hepatic and pancreatic lesions noted incidentally on CT scan. MRI abdomen is pending. Patient, however, is not progressing. Son agreeable to palliative medicine consult.  Assessment & Plan:   Principal Problem:   Decubitus ulcer of sacral region, unstageable [facility]) Active Problems:   Atrial fibrillation (HCC)   Chronic venous insufficiency   Chronic diastolic CHF (congestive heart failure) (HCC)   Acute on chronic renal failure (HCC)   Delirium   DNR (do not resuscitate)   Palliative care by specialist   Infected Decubitus ulcer of sacral region -Large sacral decubitus ulcer, unstageable with concerns for infection.  -CT scan did show some air pockets without any evidence for abscess. Started on vancomycin and Zosyn. -Wound care was consulted. Subsequently, general surgery was consulted. Patient's anticoagulation was held.  -Patient underwent surgical debridement 1/26. Patient was placed on vancomycin and Zosyn, which is being continued.  -Continue hydrotherapy, continue antibiotics for now. -I agree with the general surgery, his mental and nutritional status is good and determine if he is getting better or not. -Palliative that with the family, DNR for now but they  want to continue to treat and see how he does. -General surgery wants to evaluate him on Monday to consider wound VAC.  Acute encephalopathy in the setting of questionable dementia Some of this is due to baseline cognitive impairment. According to patient's son, patient has had issues with memory for [name] few months. Patient was noted to be on Aricept [name] few months ago. Currently on Depakote and Seroquel. Mental status has been stable. Workup done here included B-12 level which was normal. TSH normal. RPR and HIV are nonreactive. EEG did not show any epileptiform activity. MRI brain was limited due to motion, however, did not show any acute findings. Atrophy was present. Continue thiamine considering history of alcohol use in the past. Depakote level is nontoxic. Patient's verbalization of wanting to hurt himself, was most likely secondary to pain. Seen by psychiatry.  Unclear to me why he has this disorientation,? Dementia.  [name].Fib  -CHA2DS2-VASc score is 4. Rate is controlled with Coreg. Patient was on Eliquis, which has been held for surgery. -Resume when okay with surgery.  -Hemoglobin overall trend is going down, Eliquis restarted, continue to follow hemoglobin closely.  Normocytic anemia  -Hemoglobin noted to have drifted down, but stable. No overt bleeding has been noted. Bleeding noted from his wound.  Likely mild AKI on CKD stage 3/hypernatremia -Creatinine baseline 1.4, presented with creatinine 1.6 very close to baseline.  -Stop IV fluids restart diuresis.  Hypernatremia -Sodium is 154, back to normal    Chronic diastolic CHF  -Holding lasix and spironolactone for AKI. Seems euvolemic. Restarted Demadex.  Hepatic and pancreatic lesions -Cystic lesions incidentally noted on CT scan.  -MRI done and showed possible liver cirrhosis, pancreatic lesion does not look malignant, likely benign versus pseudocyst.  Chronic venous insufficiency  Stable.  Goals of care Patient also  noted to have generalized deconditioning. His quality of life appears to be poor. His activity level and functional status is not known, but likely poor considering presence of decubitus ulcer. Patient may benefit from palliative medicine input for goals of care. Discussed in detail with patient's son today. Even if the MRI abdomen does not show any concerning findings, patient's general medical status and health has been declining. He has poor quality of life. He is agreeable to talk to palliative medicine for goals of care. Patient has other children including 2 daughters and [name] sister.   DVT prophylaxis: SCDs (was on Eliquis at home on hold because of previous procedure) Code Status: DNR Family Communication:  Disposition Plan:  Diet: DIET DYS 2 Room service appropriate? Yes; Fluid consistency: Thin  Consultants:   Gen. surgery  Procedures:   None  Antimicrobials:   Zosyn and vancomycin  Objective: Vitals:   01/09/17 0924 01/09/17 2108 01/10/17 0458 01/10/17 0928  BP: (!) 172/78 (!) 141/60 129/64 (!) 121/52  Pulse: 71 69 77 74  Resp: 18 18 18 18   Temp: 98.3 F (36.8 C) 97.9 F (36.6 C) 98.4 F (36.9 C) 98.2 F (36.8 C)  TempSrc: Oral Oral Oral Oral  SpO2: 99% 95% 96% 99%  Weight:  126.6 kg (279 lb)    Height:        Intake/Output Summary (Last 24 hours) at 01/10/17 1213 Last data filed at 01/10/17 0900  Gross per 24 hour  Intake              600 ml  Output              951 ml  Net             -351 ml   Filed Weights   01/06/17 2118 01/08/17 2100 01/09/17 2108  Weight: 119.8 kg (264 lb 3.2 oz) 129.7 kg (286 lb) 126.6 kg (279 lb)    Examination: General exam: Appears calm and comfortable  Respiratory system: Clear to auscultation. Respiratory effort normal. Cardiovascular system: S1 & S2 heard, RRR. No JVD, murmurs, rubs, gallops or clicks. No pedal edema. Gastrointestinal system: Abdomen is nondistended, soft and nontender. No organomegaly or masses felt. Normal  bowel sounds heard. Central nervous system: Alert and oriented. No focal neurological deficits. Extremities: Symmetric 5 x 5 power. Skin: No rashes, lesions or ulcers Psychiatry: Judgement and insight appear normal. Mood & affect appropriate.   Data Reviewed: I have personally reviewed following labs and imaging studies  CBC:  Recent Labs Lab 01/05/17 0509 01/06/17 0652 01/07/17 0644 01/08/17 0629  WBC 10.5 13.1* 10.7* 9.4  HGB 8.9* 8.9* 8.6* 8.4*  HCT 28.9* 29.2* 28.4* 27.1*  MCV 88.4 88.5 89.3 89.4  PLT 212 219 224 244   Basic Metabolic Panel:  Recent Labs Lab 01/05/17 0509 01/06/17 0652 01/07/17 0644 01/08/17 0629  NA 143 142 142 141  K 3.5 3.6 3.7 3.8  CL 108 105 105 104  CO2 28 28 29 30   GLUCOSE 114* 128* 108* 122*  BUN 28* 26* 26* 27*  CREATININE 1.19 1.21 1.10 1.08  CALCIUM 9.3 9.1 9.1 9.2   GFR: Estimated Creatinine Clearance: 85 mL/min (by C-G formula based on SCr of 1.08 mg/dL). Liver Function Tests: No results for input(s): AST, ALT, ALKPHOS, BILITOT, PROT, ALBUMIN in the last 168 hours. No results for input(s): LIPASE, AMYLASE in the last 168 hours. No results for input(s): AMMONIA in the last 168  hours. Coagulation Profile: No results for input(s): INR, PROTIME in the last 168 hours. Cardiac Enzymes: No results for input(s): CKTOTAL, CKMB, CKMBINDEX, TROPONINI in the last 168 hours. BNP (last 3 results) No results for input(s): PROBNP in the last 8760 hours. HbA1C: No results for input(s): HGBA1C in the last 72 hours. CBG: No results for input(s): GLUCAP in the last 168 hours. Lipid Profile: No results for input(s): CHOL, HDL, LDLCALC, TRIG, CHOLHDL, LDLDIRECT in the last 72 hours. Thyroid Function Tests: No results for input(s): TSH, T4TOTAL, FREET4, T3FREE, THYROIDAB in the last 72 hours. Anemia Panel: No results for input(s): VITAMINB12, FOLATE, FERRITIN, TIBC, IRON, RETICCTPCT in the last 72 hours. Urine analysis:    Component Value  Date/Time   COLORURINE YELLOW 12/29/2016 2053   APPEARANCEUR CLEAR 12/29/2016 2053   LABSPEC 1.009 12/29/2016 2053   PHURINE 6.0 12/29/2016 2053   GLUCOSEU NEGATIVE 12/29/2016 2053   HGBUR NEGATIVE 12/29/2016 2053   BILIRUBINUR NEGATIVE 12/29/2016 2053   KETONESUR NEGATIVE 12/29/2016 2053   PROTEINUR NEGATIVE 12/29/2016 2053   UROBILINOGEN 4.0 (H) 06/02/2012 1200   NITRITE NEGATIVE 12/29/2016 2053   LEUKOCYTESUR MODERATE ([name]) 12/29/2016 2053   Sepsis Labs: @LABRCNTIP (procalcitonin:4,lacticidven:4)  ) Recent Results (from the past 240 hour(s))  Surgical PCR screen     Status: Abnormal   Collection Time: 01/02/17  8:10 AM  Result Value Ref Range Status   MRSA, PCR POSITIVE ([name]) NEGATIVE Final    Comment: RESULT CALLED TO, READ BACK BY AND VERIFIED WITH: C. Peeler NT 11:05 01/02/17 ([email or web address])    Staphylococcus aureus POSITIVE ([name]) NEGATIVE Final    Comment:        The Xpert SA Assay (FDA approved for NASAL specimens in patients over 27 years of age), is one component of [name] comprehensive surveillance program.  Test performance has been validated by [facility] for patients greater than or equal to 66 year old. It is not intended to diagnose infection nor to guide or monitor treatment.      Invalid input(s): PROCALCITONIN, LACTICACIDVEN   Radiology Studies: No results found.      Scheduled Meds: . apixaban  5 mg Oral BID  . atorvastatin  80 mg Oral q1800  . carvedilol  6.25 mg Oral BID WC  . Chlorhexidine Gluconate Cloth  6 each Topical Q0600  . divalproex  500 mg Oral QHS  . feeding supplement (ENSURE ENLIVE)  237 mL Oral TID BM  . feeding supplement (PRO-STAT SUGAR FREE 64)  30 mL Oral TID  . LORazepam  1 mg Intravenous Once  . mouth rinse  15 mL Mouth Rinse BID  . pantoprazole sodium  40 mg Oral BID  . piperacillin-tazobactam (ZOSYN)  IV  3.375 g Intravenous Q8H  . QUEtiapine  12.5 mg Oral BID  . saccharomyces boulardii  250 mg Oral BID  . thiamine  100 mg  Oral Daily  . torsemide  20 mg Oral Daily  . vancomycin  1,000 mg Intravenous Q24H   Continuous Infusions:    LOS: 11 days    Time spent: 35 minutes    Jesus Mcgath A, MD [facility] Pager [phone]  If 7PM-7AM, please contact night-coverage [email or web address] Password [email or web address] 01/10/2017, 12:13 PM

## 2017-01-10 NOTE — Progress Notes (Signed)
Physical Therapy Wound Treatment Patient Details  Name: TAVEON ENYEART MRN: [id number] Date of Birth: 1946/06/12  Today's Date: 01/10/2017 Time:  -     Subjective  Subjective: Pleasant but painful and yelling out during turns to get positioned for treatment. Patient and Family Stated Goals: Pt did not state goals during session Date of Onset:  (Chronic) Prior Treatments: I&D 01/02/17  Pain Score: Pt premedicated   Wound Assessment  Pressure Injury 12/29/16 Unstageable - Full thickness tissue loss in which the base of the ulcer is covered by slough (yellow, tan, gray, green or brown) and/or eschar (tan, brown or black) in the wound bed. (Active)  Dressing Type ABD;Barrier Film (skin prep);Gauze (Comment);Moist to dry;Non adherent 01/10/2017 10:00 AM  Dressing Intact 01/10/2017 10:00 AM  Dressing Change Frequency Daily 01/10/2017 10:00 AM  State of Healing Early/partial granulation 01/10/2017 10:00 AM  Site / Wound Assessment Bleeding;Black;Painful;Red;Yellow 01/10/2017 10:00 AM  % Wound base Red or Granulating 50% 01/10/2017 10:00 AM  % Wound base Yellow/Fibrinous Exudate 40% 01/10/2017 10:00 AM  % Wound base Black/Eschar 10% 01/10/2017 10:00 AM  % Wound base Other/Granulation Tissue (Comment) 0% 01/09/2017  2:12 PM  Peri-wound Assessment Maceration;Induration 01/10/2017 10:00 AM  Wound Length (cm) 11 cm 01/06/2017  3:22 PM  Wound Width (cm) 15 cm 01/06/2017  3:22 PM  Wound Depth (cm) 6 cm 01/06/2017  3:22 PM  Drainage Amount Moderate 01/10/2017 10:00 AM  Drainage Description Serosanguineous;Purulent 01/10/2017 10:00 AM  Treatment Debridement (Selective);Hydrotherapy (Pulse lavage);Packing (Saline gauze) 01/10/2017 10:00 AM   Hydrotherapy Pulsed lavage therapy - wound location: Sacrum Pulsed Lavage with Suction (psi): 12 psi Pulsed Lavage with Suction - Normal Saline Used: 1000 mL Pulsed Lavage Tip: Tip with splash shield Selective Debridement Selective Debridement - Location: Sacrum Selective  Debridement - Tools Used: Forceps;Scissors Selective Debridement - Tissue Removed: Yellow and black necrotic tissue   Wound Assessment and Plan  Wound Therapy - Assess/Plan/Recommendations Wound Therapy - Clinical Statement: Minimal debridement accomplished today due to difficulty with positioning. Pt pushing back against therapist and not able to tolerate sidelying very well this session. Please update hydrotherapy orders if applicable after Palliative meeting 01/11/17. Wound Therapy - Functional Problem List: Decreased tolerance for OOB, bed mobility; acute pain Factors Delaying/Impairing Wound Healing: Altered sensation;Immobility;Multiple medical problems;Vascular compromise Hydrotherapy Plan: Debridement;Dressing change;Patient/family education;Pulsatile lavage with suction Wound Therapy - Frequency: 6X / week Wound Therapy - Follow Up Recommendations: Skilled nursing facility Wound Plan: See above  Wound Therapy Goals- Improve the function of patient's integumentary system by progressing the wound(s) through the phases of wound healing (inflammation - proliferation - remodeling) by: Decrease Necrotic Tissue to: 25% Decrease Necrotic Tissue - Progress: Progressing toward goal Increase Granulation Tissue to: 75% Increase Granulation Tissue - Progress: Progressing toward goal Goals/treatment plan/discharge plan were made with and agreed upon by patient/family: Yes Time For Goal Achievement: 7 days Wound Therapy - Potential for Goals: Good  Goals will be updated until maximal potential achieved or discharge criteria met.  Discharge criteria: when goals achieved, discharge from hospital, MD decision/surgical intervention, no progress towards goals, refusal/missing three consecutive treatments without notification or medical reason.  GP     Thelma Comp 01/10/2017, 11:07 AM   Rolinda Roan, PT, DPT Acute Rehabilitation Services Pager: [phone]

## 2017-01-11 NOTE — Progress Notes (Signed)
PROGRESS NOTE  Jesus [name]  ZOX:[id number] DOB: 11-Oct-1946 DOA: 12/29/2016 PCP: [name],[name], [name] Outpatient Specialists:  Subjective: No changes clinically continues to be confused, disoriented.  Brief Narrative:  Jesus DragonDouglas M Prevetteis a 71 y.o.malewith medical history significant for A.Fib, HLD, HTN, sacral decubitus ulcer. Patient [facility] ED [facility] with c/o confusion and elevated WBC on routine labs. Concern was for infection in the sacral decubitus. Patient was hospitalized for management. Patient was placed on broad-spectrum antibiotics. Anticoagulation was held. Patient underwent surgical debridement on 1/26. Patient also noted to have hepatic and pancreatic lesions noted incidentally on CT scan. MRI abdomen is pending. Patient, however, is not progressing. Son agreeable to palliative medicine consult.  Assessment & Plan:   Principal Problem:   Decubitus ulcer of sacral region, unstageable [facility](HCC) Active Problems:   Atrial fibrillation (HCC)   Chronic venous insufficiency   Chronic diastolic CHF (congestive heart failure) (HCC)   Acute on chronic renal failure (HCC)   Delirium   DNR (do not resuscitate)   Palliative care by specialist   Infected Decubitus ulcer of sacral region -Large sacral decubitus ulcer, unstageable with concerns for infection.  -CT scan did show some air pockets without any evidence for abscess. Started on vancomycin and Zosyn. -Wound care was consulted. Subsequently, general surgery was consulted. Patient's anticoagulation was held.  -Patient underwent surgical debridement 1/26. Patient was placed on vancomycin and Zosyn, which is being continued.  -Continue hydrotherapy, continue antibiotics for now. -I agree with the general surgery, his mental and nutritional status is good and determine if he is getting better or not. -Palliative that with the family, DNR for now but they want to continue to treat and see  how he does. -Away general surgery evaluation tomorrow to consider wound VAC.  Acute encephalopathy in the setting of questionable dementia Some of this is due to baseline cognitive impairment. According to patient's son, patient has had issues with memory for a few months. Patient was noted to be on Aricept a few months ago. Currently on Depakote and Seroquel. Mental status has been stable. Workup done here included B-12 level which was normal. TSH normal. RPR and HIV are nonreactive. EEG did not show any epileptiform activity. MRI brain was limited due to motion, however, did not show any acute findings. Atrophy was present. Continue thiamine considering history of alcohol use in the past. Depakote level is nontoxic. Patient's verbalization of wanting to hurt himself, was most likely secondary to pain. Seen by psychiatry.  Unclear to me why he has this disorientation,? Dementia.  A.Fib  -CHA2DS2-VASc score is 4. Rate is controlled with Coreg. Patient was on Eliquis, which has been held for surgery. -Resume when okay with surgery.  -Hemoglobin overall trend is going down, Eliquis restarted, continue to follow hemoglobin closely.  Normocytic anemia  -Hemoglobin noted to have drifted down, but stable. No overt bleeding has been noted. Bleeding noted from his wound.  Likely mild AKI on CKD stage 3/hypernatremia -Creatinine baseline 1.4, presented with creatinine 1.6 very close to baseline.  -Stop IV fluids restart diuresis.  Hypernatremia -Sodium is 154, back to normal    Chronic diastolic CHF  -Holding lasix and spironolactone for AKI. Seems euvolemic. Restarted Demadex.  Hepatic and pancreatic lesions -Cystic lesions incidentally noted on CT scan.  -MRI done and showed possible liver cirrhosis, pancreatic lesion does not look malignant, likely benign versus pseudocyst.  Chronic venous insufficiency  Stable.  Goals of care Patient also noted  to have generalized deconditioning.  His quality of life appears to be poor. His activity level and functional status is not known, but likely poor considering presence of decubitus ulcer. Patient may benefit from palliative medicine input for goals of care. Discussed in detail with patient's son today. Even if the MRI abdomen does not show any concerning findings, patient's general medical status and health has been declining. He has poor quality of life. He is agreeable to talk to palliative medicine for goals of care. Patient has other children including 2 daughters and a sister.   DVT prophylaxis: SCDs (was on Eliquis at home on hold because of previous procedure) Code Status: DNR Family Communication:  Disposition Plan:  Diet: DIET DYS 2 Room service appropriate? Yes; Fluid consistency: Thin  Consultants:   Gen. surgery  Procedures:   None  Antimicrobials:   Zosyn and vancomycin  Objective: Vitals:   01/10/17 1700 01/10/17 2147 01/11/17 0637 01/11/17 1036  BP: 130/68 (!) 133/56 (!) 170/67 (!) 140/58  Pulse: 81 67 77 74  Resp: 18 16 18 17   Temp: 98.1 F (36.7 C) 98.4 F (36.9 C) 98.3 F (36.8 C) 98.9 F (37.2 C)  TempSrc: Oral Oral Oral Oral  SpO2: 98% 99% 98% 95%  Weight:  130.2 kg (287 lb)    Height:        Intake/Output Summary (Last 24 hours) at 01/11/17 1206 Last data filed at 01/11/17 1102  Gross per 24 hour  Intake              820 ml  Output             1275 ml  Net             -455 ml   Filed Weights   01/08/17 2100 01/09/17 2108 01/10/17 2147  Weight: 129.7 kg (286 lb) 126.6 kg (279 lb) 130.2 kg (287 lb)    Examination: General exam: Appears calm and comfortable  Respiratory system: Clear to auscultation. Respiratory effort normal. Cardiovascular system: S1 & S2 heard, RRR. No JVD, murmurs, rubs, gallops or clicks. No pedal edema. Gastrointestinal system: Abdomen is nondistended, soft and nontender. No organomegaly or masses felt. Normal bowel sounds heard. Central nervous system:  Alert and oriented. No focal neurological deficits. Extremities: Symmetric 5 x 5 power. Skin: No rashes, lesions or ulcers Psychiatry: Judgement and insight appear normal. Mood & affect appropriate.   Data Reviewed: I have personally reviewed following labs and imaging studies  CBC:  Recent Labs Lab 01/05/17 0509 01/06/17 0652 01/07/17 0644 01/08/17 0629  WBC 10.5 13.1* 10.7* 9.4  HGB 8.9* 8.9* 8.6* 8.4*  HCT 28.9* 29.2* 28.4* 27.1*  MCV 88.4 88.5 89.3 89.4  PLT 212 219 224 244   Basic Metabolic Panel:  Recent Labs Lab 01/05/17 0509 01/06/17 0652 01/07/17 0644 01/08/17 0629  NA 143 142 142 141  K 3.5 3.6 3.7 3.8  CL 108 105 105 104  CO2 28 28 29 30   GLUCOSE 114* 128* 108* 122*  BUN 28* 26* 26* 27*  CREATININE 1.19 1.21 1.10 1.08  CALCIUM 9.3 9.1 9.1 9.2   GFR: Estimated Creatinine Clearance: 86.3 mL/min (by C-G formula based on SCr of 1.08 mg/dL). Liver Function Tests: No results for input(s): AST, ALT, ALKPHOS, BILITOT, PROT, ALBUMIN in the last 168 hours. No results for input(s): LIPASE, AMYLASE in the last 168 hours. No results for input(s): AMMONIA in the last 168 hours. Coagulation Profile: No results for input(s): INR, PROTIME in the  last 168 hours. Cardiac Enzymes: No results for input(s): CKTOTAL, CKMB, CKMBINDEX, TROPONINI in the last 168 hours. BNP (last 3 results) No results for input(s): PROBNP in the last 8760 hours. HbA1C: No results for input(s): HGBA1C in the last 72 hours. CBG: No results for input(s): GLUCAP in the last 168 hours. Lipid Profile: No results for input(s): CHOL, HDL, LDLCALC, TRIG, CHOLHDL, LDLDIRECT in the last 72 hours. Thyroid Function Tests: No results for input(s): TSH, T4TOTAL, FREET4, T3FREE, THYROIDAB in the last 72 hours. Anemia Panel: No results for input(s): VITAMINB12, FOLATE, FERRITIN, TIBC, IRON, RETICCTPCT in the last 72 hours. Urine analysis:    Component Value Date/Time   COLORURINE YELLOW 12/29/2016 2053     APPEARANCEUR CLEAR 12/29/2016 2053   LABSPEC 1.009 12/29/2016 2053   PHURINE 6.0 12/29/2016 2053   GLUCOSEU NEGATIVE 12/29/2016 2053   HGBUR NEGATIVE 12/29/2016 2053   BILIRUBINUR NEGATIVE 12/29/2016 2053   KETONESUR NEGATIVE 12/29/2016 2053   PROTEINUR NEGATIVE 12/29/2016 2053   UROBILINOGEN 4.0 (H) 06/02/2012 1200   NITRITE NEGATIVE 12/29/2016 2053   LEUKOCYTESUR MODERATE (A) 12/29/2016 2053   Sepsis Labs: @LABRCNTIP (procalcitonin:4,lacticidven:4)  ) Recent Results (from the past 240 hour(s))  Surgical PCR screen     Status: Abnormal   Collection Time: 01/02/17  8:10 AM  Result Value Ref Range Status   MRSA, PCR POSITIVE (A) NEGATIVE Final    Comment: RESULT CALLED TO, READ BACK BY AND VERIFIED WITH: C. Peeler NT 11:05 01/02/17 ([email or web address])    Staphylococcus aureus POSITIVE (A) NEGATIVE Final    Comment:        The Xpert SA Assay (FDA approved for NASAL specimens in patients over 34 years of age), is one component of a comprehensive surveillance program.  Test performance has been validated by [facility] for patients greater than or equal to 53 year old. It is not intended to diagnose infection nor to guide or monitor treatment.      Invalid input(s): PROCALCITONIN, LACTICACIDVEN   Radiology Studies: No results found.      Scheduled Meds: . apixaban  5 mg Oral BID  . atorvastatin  80 mg Oral q1800  . carvedilol  6.25 mg Oral BID WC  . divalproex  500 mg Oral QHS  . feeding supplement (ENSURE ENLIVE)  237 mL Oral TID BM  . feeding supplement (PRO-STAT SUGAR FREE 64)  30 mL Oral TID  . LORazepam  1 mg Intravenous Once  . mouth rinse  15 mL Mouth Rinse BID  . pantoprazole sodium  40 mg Oral BID  . piperacillin-tazobactam (ZOSYN)  IV  3.375 g Intravenous Q8H  . QUEtiapine  12.5 mg Oral BID  . saccharomyces boulardii  250 mg Oral BID  . thiamine  100 mg Oral Daily  . torsemide  20 mg Oral Daily  . vancomycin  1,000 mg Intravenous Q24H   Continuous  Infusions:    LOS: 12 days    Time spent: 35 minutes    Baily Serpe A, MD [facility] Pager [phone]  If 7PM-7AM, please contact night-coverage [email or web address] Password [email or web address] 01/11/2017, 12:06 PM

## 2017-01-11 NOTE — Progress Notes (Signed)
Daily Progress Note   Patient Name: Jesus [name]       Date: 01/11/2017 DOB: 1946/08/07  Age: 71 y.o. MRN#: [id number] Attending Physician: Clydia Llano, MD Primary Care Physician: Miki Kins Admit Date: 12/29/2016  Reason for Consultation/Follow-up: Establishing goals of care and Psychosocial/spiritual support  Subjective:  - continued conversation with family (three children and patient's siser) to discuss diagnosis, prognosis, GOC, EOL wishes disposition and options.   -attempted to answer questions and concerns to the best of my ability.  Family questions "how all this could happen so fast?".    -after long discussion as a  family they did retrospectively look at his choices in the past regarding his health and well being and can understand this not surprising. However at this time they hope they can intervene and "make things different"  Hopeful for placement for long term wound care   -Values and goals of care important to patient and family were attempted to be elicited. As a whole the family expresses that " he would never want to live in a nursing home", "jhe didn't want to be a burden"  - The difference between a aggressive medical intervention path  and a palliative comfort care path for this patient at this time was had.     Family encouraged to call with questions or concerns.  PMT will continue to support holistically.   Length of Stay: 12  Current Medications: Scheduled Meds:  . apixaban  5 mg Oral BID  . atorvastatin  80 mg Oral q1800  . carvedilol  6.25 mg Oral BID WC  . divalproex  500 mg Oral QHS  . feeding supplement (ENSURE ENLIVE)  237 mL Oral TID BM  . feeding supplement (PRO-STAT SUGAR FREE 64)  30 mL Oral TID  . LORazepam  1 mg Intravenous  Once  . mouth rinse  15 mL Mouth Rinse BID  . pantoprazole sodium  40 mg Oral BID  . piperacillin-tazobactam (ZOSYN)  IV  3.375 g Intravenous Q8H  . QUEtiapine  12.5 mg Oral BID  . saccharomyces boulardii  250 mg Oral BID  . thiamine  100 mg Oral Daily  . torsemide  20 mg Oral Daily  . vancomycin  1,000 mg Intravenous Q24H    Continuous Infusions:   PRN Meds: acetaminophen, HYDROcodone-acetaminophen, HYDROmorphone (DILAUDID)  injection, hydrOXYzine  Physical Exam  Constitutional: He appears ill.  elderly obese male    Cardiovascular: Normal rate, regular rhythm and normal heart sounds.   Pulmonary/Chest: He has decreased breath sounds in the right lower field and the left lower field.  Musculoskeletal:  - BLE with continued + 1 edema  Neurological: He is alert. He is disoriented. He displays atrophy.  Skin: Skin is warm and dry.  Noted sacral wound, see EMR  Psychiatric: Cognition and memory are impaired.            Vital Signs: BP (!) 140/58 (BP Location: Left Arm)   Pulse 74   Temp 98.9 F (37.2 C) (Oral)   Resp 17   Ht 5\' 10"  (1.778 m)   Wt 130.2 kg (287 lb)   SpO2 95%   BMI 41.18 kg/m  SpO2: SpO2: 95 % O2 Device: O2 Device: Not Delivered O2 Flow Rate: O2 Flow Rate (L/min): 2 L/min  Intake/output summary:  Intake/Output Summary (Last 24 hours) at 01/11/17 1254 Last data filed at 01/11/17 1102  Gross per 24 hour  Intake              820 ml  Output             1275 ml  Net             -455 ml   LBM: Last BM Date: 01/09/17 Baseline Weight: Weight: 136.1 kg (300 lb) Most recent weight: Weight: 130.2 kg (287 lb)       Palliative Assessment/Data: 30 % at best    Flowsheet Rows   Flowsheet Row Most Recent Value  Intake Tab  Referral Department  Hospitalist  Unit at Time of Referral  Med/Surg Unit  Palliative Care Primary Diagnosis  Sepsis/Infectious Disease  Date Notified  01/06/17  Palliative Care Type  New Palliative care  Reason for referral   Clarify Goals of Care  Date of Admission  12/29/16  Date first seen by Palliative Care  01/08/17  # of days Palliative referral response time  2 Day(s)  # of days IP prior to Palliative referral  8  Clinical Assessment  Palliative Performance Scale Score  20%  Psychosocial & Spiritual Assessment  Palliative Care Outcomes      Patient Active Problem List   Diagnosis Date Noted  . DNR (do not resuscitate)   . Palliative care by specialist   . Decubitus ulcer of sacral region, unstageable (HCC) 12/30/2016  . Delirium 12/30/2016  . Cellulitis of lower extremity   . Hyponatremia 07/15/2015  . Cellulitis of right lower extremity 07/15/2015  . Acute on chronic renal failure (HCC)   . Hypokalemia   . Chronic atrial fibrillation (HCC) 07/09/2015  . Chronic anemia 07/09/2015  . CHF exacerbation (HCC) 07/08/2015  . Chronic diastolic CHF (congestive heart failure) (HCC) 04/02/2015  . Chronic diastolic heart failure (HCC) 01/25/2015  . Cellulitis 01/17/2015  . Pulmonary vascular congestion   . Congestive heart disease (HCC)   . Peripheral edema   . Acute exacerbation of CHF (congestive heart failure) (HCC) 01/04/2015  . 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 Renal artery stenosis (HCC) 02/17/2014  . Peripheral vascular disease (HCC) 04/15/2013  . Chronic venous insufficiency 04/15/2013  . Morbid obesity (HCC) 06/11/2012  . Acute on chronic diastolic CHF (congestive heart failure) (HCC) 06/02/2012  . Unspecified essential hypertension 05/18/2012  . Atrial fibrillation (HCC)  04/27/2012  . CHF (congestive heart failure) (HCC) 04/27/2012  . Hypertension 04/27/2012    Palliative Care Assessment & Plan   Patient Profile: 71 y.o. male   admitted on 12/29/2016 with a past  medical history significant of A.Fib, HLD, HTN, sacral decubitus ulcer.  Patient preseneds to the ED via  [facility] with c/o confusion and elevated WBC today on routine labs.  ED Course:Work up in the ED is significant for an unstageable sacral decubitus ulcer.  Assessment:  Long term poor prognosis 2/2 to multiple co-morbid ites and now a stage IV decubitis  Recommendations/Plan:  Family desire continued treatment of wound, [facility] would care if possible   Goals of Care and Additional Recommendations: Family is open to all offered and available medical interventions to prolong life Code Status:    Code Status Orders        Start     Ordered   01/08/17 1427  Do not attempt resuscitation (DNR)  Continuous    Question Answer Comment  In the event of cardiac or respiratory ARREST Do not call a "code blue"   In the event of cardiac or respiratory ARREST Do not perform Intubation, CPR, defibrillation or ACLS   In the event of cardiac or respiratory ARREST Use medication by any route, position, wound care, and other measures to relive pain and suffering. May use oxygen, suction and manual treatment of airway obstruction as needed for comfort.      01/08/17 1426    Code Status History    Date Active Date Inactive Code Status Order ID Comments User Context   12/30/2016  5:48 AM 01/08/2017  2:26 PM Full Code [id number]  Hillary BowJared M Gardner, DO ED   11/24/2016  1:24 PM 11/29/2016  3:07 PM Full Code [id number]  Gwenyth BenderKaren M Black, NP ED   11/13/2016 10:41 PM 11/17/2016  9:47 PM Full Code [id number]  Eduard ClosArshad N Kakrakandy, MD Inpatient   07/09/2015 12:40 AM 07/18/2015  5:56 PM Full Code [id number]  Eduard ClosArshad N Kakrakandy, MD Inpatient   01/04/2015 10:39 PM 01/12/2015  7:37 PM Full Code [id number]  Inez CatalinaEmily B Mullen, MD Inpatient    Advance Directive Documentation   Flowsheet Row Most Recent Value  Type of Advance Directive  Healthcare Power of Attorney, Living will [daughter, [name]]  Pre-existing out of facility DNR order (yellow form or pink MOST form)   No data  "MOST" Form in Place?  No data       Prognosis:   Unable to determine, dependant on desire for life prolonging intervetnions  Discharge Planning:  To Be Determined  Care plan was discussed with  Bedside RN  Thank you for allowing the Palliative Medicine Team to assist in the care of this patient.   Time In: 1030 Time Out: 1130 Total Time 60 min Prolonged Time Billed  no       Greater than 50%  of this time was spent counseling and coordinating care related to the above assessment and plan.  Lorinda CreedLARACH, MARY, NP  Please contact Palliative Medicine Team phone at [phone] for questions and concerns.

## 2017-01-12 DIAGNOSIS — Z515 Encounter for palliative care: Secondary | ICD-10-CM

## 2017-01-12 NOTE — Progress Notes (Signed)
S: no major issues over the weekend  Vitals, labs, intake/output, and orders reviewed at this time.  Gen: Alert, confused Wound: looks about the same as photo from Friday. Fibrinous tissue at base but mostly beefy granulation.  Ext: BLE unna boots    A/P:  Large infected sacral decub s/p debridement 1/26 and subsequent ongoing improvement with hydrotherapy. Wound care consult [facility].    Phylliss Blakeshelsea Avonda Toso, MD [facility] [place] Pager [phone]

## 2017-01-12 NOTE — Progress Notes (Signed)
Speech Language Pathology Treatment: Dysphagia  Patient Details Name: Jesus [name] MRN: [id number] DOB: 1946-12-03 Today's Date: 01/12/2017 Time: [id number] SLP Time Calculation (min) (ACUTE ONLY): 15 min  Assessment / Plan / Recommendation Clinical Impression  [name] was alert, but significantly disoriented and confused throughout the treatment session. Prolonged mastication and oral holding was noted during trials of regular solids, prompting SLP to provide verbal and visual cues to "chew", which was inconsistently beneficial. Pt did not exhibit coughing/throat clearing with thin liquids via straw. Due to pt's mentation, confusion, and tendency for oral holding, recommend downgrading diet to dysphagia 1 solids, thin liquids (straws ok), crushed meds with puree, and check for oral pocketing/holding. ST will f/u briefly for treatment to assess safety/efficiency of swallow and for possible diet upgrade.   HPI HPI: 71 y.o. male with medical history significant of A.Fib, HLD, HTN, sacral decubitus ulcer.  Patient presents [facility] the ED [facility] with c/o confusion and elevated WBC today on routine labs.  It is unclear how long patient has been confused for and patient isnt able to recall specific details regarding how long his symptoms have been ongoing.  Does describe severe pain in buttock area and pain "everywhere" when he tries to move his legs (the latter presumably from the known contractures he has).      SLP Plan  Continue with current plan of care     Recommendations  Diet recommendations: Dysphagia 1 (puree);Thin liquid Liquids provided via: Cup;Straw Medication Administration: Whole meds with puree Supervision: Staff to assist with self feeding;Full supervision/cueing for compensatory strategies Compensations: Minimize environmental distractions;Slow rate;Small sips/bites;Lingual sweep for clearance of pocketing Postural Changes and/or Swallow  Maneuvers: Seated upright 90 degrees                Oral Care Recommendations: Oral care BID Follow up Recommendations: Skilled Nursing facility;24 hour supervision/assistance Plan: Continue with current plan of care       GO                Macarthur CritchleyMeredith Moataz Tavis , Student-SLP 01/12/2017, 11:42 AM

## 2017-01-12 NOTE — Progress Notes (Signed)
PROGRESS NOTE  Jesus [name]  LKG:[id number] DOB: January 07, 1946 DOA: 12/29/2016 PCP: Miki Kins Outpatient Specialists:  Subjective: Continues to be confused and disoriented, no changes clinically.  Brief Narrative:  Jesus Aguayo Prevetteis a 71 y.o.malewith medical history significant for A.Fib, HLD, HTN, sacral decubitus ulcer. Patient presented to the ED via EMS from [facility] with c/o confusion and elevated WBC on routine labs. Concern was for infection in the sacral decubitus. Patient was hospitalized for management. Patient was placed on broad-spectrum antibiotics. Anticoagulation was held. Patient underwent surgical debridement on 1/26. Patient also noted to have hepatic and pancreatic lesions noted incidentally on CT scan. MRI abdomen is pending. Patient, however, is not progressing. Son agreeable to palliative medicine consult.  Assessment & Plan:   Principal Problem:   Decubitus ulcer of sacral region, unstageable [facility]) Active Problems:   Atrial fibrillation (HCC)   Chronic venous insufficiency   Chronic diastolic CHF (congestive heart failure) (HCC)   Acute on chronic renal failure (HCC)   Delirium   DNR (do not resuscitate)   Palliative care by specialist   Disorientation   Infected Decubitus ulcer of sacral region -Large sacral decubitus ulcer, unstageable with concerns for infection.  -CT scan did show some air pockets without any evidence for abscess. Started on vancomycin and Zosyn. -Wound care was consulted. Subsequently, general surgery was consulted. Patient's anticoagulation was held.  -Patient underwent surgical debridement 1/26. Patient was placed on vancomycin and Zosyn, which is being continued.  -Continue hydrotherapy, continue antibiotics for now. -Needs to improve his nutritional status to heal the wounds. -Palliative that with the family, DNR for now but they want to continue to treat and see how he does. -Away general surgery  evaluation tomorrow to consider wound VAC.  Acute encephalopathy in the setting of questionable dementia Some of this is due to baseline cognitive impairment. According to patient's son, patient has had issues with memory for a few months. Patient was noted to be on Aricept a few months ago. Currently on Depakote and Seroquel. Mental status has been stable. Workup done here included B-12 level which was normal. TSH normal. RPR and HIV are nonreactive. EEG did not show any epileptiform activity. MRI brain was limited due to motion, however, did not show any acute findings. Atrophy was present. Continue thiamine considering history of alcohol use in the past. Depakote level is nontoxic. Patient's verbalization of wanting to hurt himself, was most likely secondary to pain. Seen by psychiatry.  Per family this is going on for some time but recently worsened, likely worsening dementia.  A.Fib  -CHA2DS2-VASc score is 4. Rate is controlled with Coreg. Patient was on Eliquis, which has been held for surgery. -Resume when okay with surgery.  -Hemoglobin overall trend is going down, Eliquis restarted, continue to follow hemoglobin closely.  Normocytic anemia  -Hemoglobin noted to have drifted down, but stable. No overt bleeding has been noted. Bleeding noted from his wound.  Likely mild AKI on CKD stage 3/hypernatremia -Creatinine baseline 1.4, presented with creatinine 1.6 very close to baseline.  -Stop IV fluids restart diuresis.  Hypernatremia -Sodium is 154, back to normal    Chronic diastolic CHF  -Holding lasix and spironolactone for AKI. Seems euvolemic. Restarted Demadex.  Hepatic and pancreatic lesions -Cystic lesions incidentally noted on CT scan.  -MRI done and showed possible liver cirrhosis, pancreatic lesion does not look malignant, likely benign versus pseudocyst.  Chronic venous insufficiency  Stable.  Goals of care Patient also noted to have  generalized deconditioning.  His quality of life appears to be poor. His activity level and functional status is not known, but likely poor considering presence of decubitus ulcer. Patient may benefit from palliative medicine input for goals of care. Discussed in detail with patient's son today. Even if the MRI abdomen does not show any concerning findings, patient's general medical status and health has been declining. He has poor quality of life. He is agreeable to talk to palliative medicine for goals of care. Patient has other children including 2 daughters and a sister.   DVT prophylaxis: On Eliquis. Code Status: DNR Family Communication:  Disposition Plan:  Diet: DIET - DYS 1 Room service appropriate? Yes; Fluid consistency: Thin  Consultants:   Gen. surgery  Procedures:   None  Antimicrobials:   Zosyn and vancomycin  Objective: Vitals:   01/11/17 1036 01/11/17 2218 01/12/17 0635 01/12/17 0924  BP: (!) 140/58 (!) 134/53 (!) 159/69 (!) 149/58  Pulse: 74 73 (!) 57 65  Resp: 17 18 17 18   Temp: 98.9 F (37.2 C) 98.6 F (37 C) 98.7 F (37.1 C) 99.4 F (37.4 C)  TempSrc: Oral Oral Oral Axillary  SpO2: 95% 100% 100% 100%  Weight:  (!) 136.2 kg (300 lb 3.2 oz)    Height:        Intake/Output Summary (Last 24 hours) at 01/12/17 1246 Last data filed at 01/12/17 1610  Gross per 24 hour  Intake              680 ml  Output             1275 ml  Net             -595 ml   Filed Weights   01/09/17 2108 01/10/17 2147 01/11/17 2218  Weight: 126.6 kg (279 lb) 130.2 kg (287 lb) (!) 136.2 kg (300 lb 3.2 oz)    Examination: General exam: Appears calm and comfortable  Respiratory system: Clear to auscultation. Respiratory effort normal. Cardiovascular system: S1 & S2 heard, RRR. No JVD, murmurs, rubs, gallops or clicks. No pedal edema. Gastrointestinal system: Abdomen is nondistended, soft and nontender. No organomegaly or masses felt. Normal bowel sounds heard. Central nervous system: Alert and oriented. No  focal neurological deficits. Extremities: Symmetric 5 x 5 power. Skin: No rashes, lesions or ulcers Psychiatry: Judgement and insight appear normal. Mood & affect appropriate.   Data Reviewed: I have personally reviewed following labs and imaging studies  CBC:  Recent Labs Lab 01/06/17 0652 01/07/17 0644 01/08/17 0629  WBC 13.1* 10.7* 9.4  HGB 8.9* 8.6* 8.4*  HCT 29.2* 28.4* 27.1*  MCV 88.5 89.3 89.4  PLT 219 224 244   Basic Metabolic Panel:  Recent Labs Lab 01/06/17 0652 01/07/17 0644 01/08/17 0629  NA 142 142 141  K 3.6 3.7 3.8  CL 105 105 104  CO2 28 29 30   GLUCOSE 128* 108* 122*  BUN 26* 26* 27*  CREATININE 1.21 1.10 1.08  CALCIUM 9.1 9.1 9.2   GFR: Estimated Creatinine Clearance: 88.5 mL/min (by C-G formula based on SCr of 1.08 mg/dL). Liver Function Tests: No results for input(s): AST, ALT, ALKPHOS, BILITOT, PROT, ALBUMIN in the last 168 hours. No results for input(s): LIPASE, AMYLASE in the last 168 hours. No results for input(s): AMMONIA in the last 168 hours. Coagulation Profile: No results for input(s): INR, PROTIME in the last 168 hours. Cardiac Enzymes: No results for input(s): CKTOTAL, CKMB, CKMBINDEX, TROPONINI in the last 168 hours. BNP (last  3 results) No results for input(s): PROBNP in the last 8760 hours. HbA1C: No results for input(s): HGBA1C in the last 72 hours. CBG: No results for input(s): GLUCAP in the last 168 hours. Lipid Profile: No results for input(s): CHOL, HDL, LDLCALC, TRIG, CHOLHDL, LDLDIRECT in the last 72 hours. Thyroid Function Tests: No results for input(s): TSH, T4TOTAL, FREET4, T3FREE, THYROIDAB in the last 72 hours. Anemia Panel: No results for input(s): VITAMINB12, FOLATE, FERRITIN, TIBC, IRON, RETICCTPCT in the last 72 hours. Urine analysis:    Component Value Date/Time   COLORURINE YELLOW 12/29/2016 2053   APPEARANCEUR CLEAR 12/29/2016 2053   LABSPEC 1.009 12/29/2016 2053   PHURINE 6.0 12/29/2016 2053    GLUCOSEU NEGATIVE 12/29/2016 2053   HGBUR NEGATIVE 12/29/2016 2053   BILIRUBINUR NEGATIVE 12/29/2016 2053   KETONESUR NEGATIVE 12/29/2016 2053   PROTEINUR NEGATIVE 12/29/2016 2053   UROBILINOGEN 4.0 (H) 06/02/2012 1200   NITRITE NEGATIVE 12/29/2016 2053   LEUKOCYTESUR MODERATE (A) 12/29/2016 2053   Sepsis Labs: @LABRCNTIP (procalcitonin:4,lacticidven:4)  ) No results found for this or any previous visit (from the past 240 hour(s)).   Invalid input(s): PROCALCITONIN, LACTICACIDVEN   Radiology Studies: No results found.      Scheduled Meds: . apixaban  5 mg Oral BID  . atorvastatin  80 mg Oral q1800  . carvedilol  6.25 mg Oral BID WC  . divalproex  500 mg Oral QHS  . feeding supplement (ENSURE ENLIVE)  237 mL Oral TID BM  . feeding supplement (PRO-STAT SUGAR FREE 64)  30 mL Oral TID  . LORazepam  1 mg Intravenous Once  . mouth rinse  15 mL Mouth Rinse BID  . pantoprazole sodium  40 mg Oral BID  . QUEtiapine  12.5 mg Oral BID  . saccharomyces boulardii  250 mg Oral BID  . thiamine  100 mg Oral Daily  . torsemide  20 mg Oral Daily   Continuous Infusions:    LOS: 13 days    Time spent: 35 minutes    Sabryn Preslar A, MD [facility] Pager [phone]  If 7PM-7AM, please contact night-coverage [email or web address] Password [email or web address] 01/12/2017, 12:46 PM

## 2017-01-12 NOTE — Care Management Important Message (Signed)
Important Message  Patient Details  Name: Jesus [name] MRN: [id number] Date of Birth: [birth year]   Medicare Important Message Given:  Yes    Epifanio LeschesCole, Breannah Kratt Hudson, RN 01/12/2017, 10:58 AM

## 2017-01-12 NOTE — Progress Notes (Signed)
Physical Therapy Wound Treatment Patient Details  Name: Jesus [name] MRN: [id number] Date of Birth: 1946/05/13  Today's Date: 01/12/2017 Time: 0865-7846 Time Calculation (min): 71 min  Subjective  Subjective: Pleasant but painful and yelling out during turns to get positioned for treatment. Patient and Family Stated Goals: Pt did not state goals during session Date of Onset:  (Chronic) Prior Treatments: I&D 01/02/17  Pain Score: Pain Score: Pt was premedicated. Tolerated positioning better this session however still appeared painful.   Wound Assessment  Pressure Injury 12/29/16 Unstageable - Full thickness tissue loss in which the base of the ulcer is covered by slough (yellow, tan, gray, green or brown) and/or eschar (tan, brown or black) in the wound bed. (Active)  Dressing Type ABD;Barrier Film (skin prep);Moist to dry;Gauze (Comment) 01/12/2017 12:29 PM  Dressing Changed;Clean;Dry;Intact 01/12/2017 12:29 PM  Dressing Change Frequency Daily 01/12/2017 12:29 PM  State of Healing Early/partial granulation 01/12/2017 12:29 PM  Site / Wound Assessment Bleeding;Black;Yellow;Red 01/12/2017 12:29 PM  % Wound base Red or Granulating 60% 01/12/2017 12:29 PM  % Wound base Yellow/Fibrinous Exudate 30% 01/12/2017 12:29 PM  % Wound base Black/Eschar 10% 01/12/2017 12:29 PM  % Wound base Other/Granulation Tissue (Comment) 0% 01/12/2017 12:29 PM  Peri-wound Assessment Maceration;Induration 01/12/2017 12:29 PM  Wound Length (cm) 11 cm 01/06/2017  3:22 PM  Wound Width (cm) 15 cm 01/06/2017  3:22 PM  Wound Depth (cm) 6 cm 01/06/2017  3:22 PM  Drainage Amount Moderate 01/12/2017 12:29 PM  Drainage Description Serosanguineous;Purulent 01/12/2017 12:29 PM  Treatment Debridement (Selective);Hydrotherapy (Pulse lavage);Packing (Saline gauze) 01/12/2017 12:29 PM   Hydrotherapy Pulsed lavage therapy - wound location: Sacrum Pulsed Lavage with Suction (psi): 12 psi Pulsed Lavage with Suction - Normal Saline Used: 1000  mL Pulsed Lavage Tip: Tip with splash shield Selective Debridement Selective Debridement - Location: Sacrum Selective Debridement - Tools Used: Forceps;Scalpel Selective Debridement - Tissue Removed: Yellow and black necrotic tissue   Wound Assessment and Plan  Wound Therapy - Assess/Plan/Recommendations Wound Therapy - Clinical Statement: Pt was able to better tolerate positioning for debridement this session. Increased bleeding noted upon prior dressing removal and during pulsed-lavage. At end of session ~40% necrotic tissue remained (30% yellow, 10% black). Pt will benefit from continued hydrotherapy for selective removal of unviable tissue and to promote wound bed healing.  Wound Therapy - Functional Problem List: Decreased tolerance for OOB, bed mobility; pain Factors Delaying/Impairing Wound Healing: Altered sensation;Immobility;Multiple medical problems;Vascular compromise Hydrotherapy Plan: Debridement;Dressing change;Patient/family education;Pulsatile lavage with suction Wound Therapy - Frequency: 6X / week Wound Therapy - Follow Up Recommendations: Skilled nursing facility Wound Plan: See above  Wound Therapy Goals- Improve the function of patient's integumentary system by progressing the wound(s) through the phases of wound healing (inflammation - proliferation - remodeling) by: Decrease Necrotic Tissue to: 25% Decrease Necrotic Tissue - Progress: Progressing toward goal Increase Granulation Tissue to: 75% Increase Granulation Tissue - Progress: Progressing toward goal Goals/treatment plan/discharge plan were made with and agreed upon by patient/family: Yes Time For Goal Achievement: 7 days Wound Therapy - Potential for Goals: Good  Goals will be updated until maximal potential achieved or discharge criteria met.  Discharge criteria: when goals achieved, discharge from hospital, MD decision/surgical intervention, no progress towards goals, refusal/missing three consecutive  treatments without notification or medical reason.  GP     Thelma Comp 01/12/2017, 12:39 PM  Rolinda Roan, PT, DPT Acute Rehabilitation Services Pager: [phone]

## 2017-01-12 NOTE — Consult Note (Signed)
WOC assessed patient with PT and CCS PA on Friday for NPWT VAC, however with the proximity to the anus and the frequent incontinence of bowel maintenance of the seal and dressing would be very difficult.  I will follow along and plan to see patient during hydrotherapy to assess wound and if frequent stooling is still an issue.  His stool is too thick for containment with a flexiseal and use of fecal pouch would inhibit placement of the VAC due to lack space for both pouch and VAC dressing.  Kharlie Bring [facility] MSN,RN,CWOCN, CNS [phone]

## 2017-01-13 LAB — CBC
HEMATOCRIT: 23.3 % — AB (ref 39.0–52.0)
Hemoglobin: 7.4 g/dL — ABNORMAL LOW (ref 13.0–17.0)
MCH: 28 pg (ref 26.0–34.0)
MCHC: 31.8 g/dL (ref 30.0–36.0)
MCV: 88.3 fL (ref 78.0–100.0)
PLATELETS: 234 10*3/uL (ref 150–400)
RBC: 2.64 MIL/uL — ABNORMAL LOW (ref 4.22–5.81)
RDW: 18.3 % — AB (ref 11.5–15.5)
WBC: 8.9 10*3/uL (ref 4.0–10.5)

## 2017-01-13 LAB — BASIC METABOLIC PANEL
Anion gap: 9 (ref 5–15)
BUN: 22 mg/dL — AB (ref 6–20)
CHLORIDE: 96 mmol/L — AB (ref 101–111)
CO2: 32 mmol/L (ref 22–32)
CREATININE: 1.11 mg/dL (ref 0.61–1.24)
Calcium: 8.9 mg/dL (ref 8.9–10.3)
GFR calc Af Amer: >60 mL/min (ref >60)
GFR calc non Af Amer: >60 mL/min (ref >60)
GLUCOSE: 111 mg/dL — AB (ref 65–99)
POTASSIUM: 4.3 mmol/L (ref 3.5–5.1)
SODIUM: 137 mmol/L (ref 135–145)

## 2017-01-13 NOTE — Clinical Social Work Note (Signed)
CSW continuing to monitor patient's progress and discharge disposition. Patient currently being evaluated by [facility]. Will continue to follow and pursue SNF placement [facility] if needed.  Genelle BalVanessa Journe Hallmark, MSW, LCSW Licensed Clinical Social Worker Clinical Social Work Department [facility] [phone]

## 2017-01-13 NOTE — Progress Notes (Signed)
Physical Therapy Wound Treatment Patient Details  Name: Jesus [name] MRN: [id number] Date of Birth: 1946-09-06  Today's Date: 01/13/2017 Time: 1287-8676 Time Calculation (min): 45 min  Subjective  Subjective: Pt looking up and to the left. able to direct eyes to therapist when speaking to patient.  pt remains to moan with movement but pleasant during tx.   Patient and Family Stated Goals: Pt did not state goals during session Date of Onset:  (chronic) Prior Treatments: I&D 01/02/17  Pain Score:  2/ 10 Faces, generalized pain with movement to position.    Wound Assessment  Pressure Injury 12/29/16 Unstageable - Full thickness tissue loss in which the base of the ulcer is covered by slough (yellow, tan, gray, green or brown) and/or eschar (tan, brown or black) in the wound bed. (Active)  Dressing Type ABD;Barrier Film (skin prep);Moist to dry;Gauze (Comment) 01/13/2017  1:39 PM  Dressing Changed;Clean;Dry;Intact 01/13/2017  1:39 PM  Dressing Change Frequency Daily 01/13/2017  1:39 PM  State of Healing Early/partial granulation 01/13/2017  1:39 PM  Site / Wound Assessment Bleeding;Black;Yellow;Red 01/13/2017  1:39 PM  % Wound base Red or Granulating 80% 01/13/2017  1:39 PM  % Wound base Yellow/Fibrinous Exudate 20% 01/13/2017  1:39 PM  % Wound base Black/Eschar 0% 01/13/2017  1:39 PM  % Wound base Other/Granulation Tissue (Comment) 0% 01/13/2017  1:39 PM  Peri-wound Assessment Pink 01/13/2017  1:39 PM  Wound Length (cm) 16 cm 01/13/2017 10:00 AM  Wound Width (cm) 20 cm 01/13/2017 10:00 AM  Wound Depth (cm) 6 cm 01/13/2017 10:00 AM  Drainage Amount Moderate 01/13/2017  1:39 PM  Drainage Description Serosanguineous 01/13/2017  1:39 PM  Treatment Debridement (Selective);Hydrotherapy (Pulse lavage) 01/13/2017  1:39 PM      Hydrotherapy Pulsed lavage therapy - wound location: Sacrum Pulsed Lavage with Suction (psi): 12 psi Pulsed Lavage with Suction - Normal Saline Used: 1000 mL Pulsed Lavage Tip: Tip with splash  shield Selective Debridement Selective Debridement - Location: Sacrum Selective Debridement - Tools Used: Forceps;Scalpel Selective Debridement - Tissue Removed: Yellow and black necrotic tissue   Wound Assessment and Plan  Wound Therapy - Assess/Plan/Recommendations Wound Therapy - Clinical Statement: Pt was resistant to rolling but once positioned patient tolerated well.  Wound vac placed by [facility] nurse during treatment after hyrdo therapy and debridement and hydrotherapy d/c'd.   Wound Therapy - Functional Problem List: Decreased tolerance for OOB, bed mobility; pain Factors Delaying/Impairing Wound Healing: Altered sensation;Immobility;Multiple medical problems;Vascular compromise Hydrotherapy Plan: Debridement;Dressing change;Patient/family education;Pulsatile lavage with suction Wound Therapy - Frequency: 6X / week Wound Therapy - Follow Up Recommendations: Skilled nursing facility Wound Plan: See above  Wound Therapy Goals- Improve the function of patient's integumentary system by progressing the wound(s) through the phases of wound healing (inflammation - proliferation - remodeling) by: Decrease Necrotic Tissue to: 25% Decrease Necrotic Tissue - Progress: Met Increase Granulation Tissue to: 75% Increase Granulation Tissue - Progress: Met Goals/treatment plan/discharge plan were made with and agreed upon by patient/family: Yes Time For Goal Achievement: 7 days Wound Therapy - Potential for Goals: Good  Goals will be updated until maximal potential achieved or discharge criteria met.  Discharge criteria: when goals achieved, discharge from hospital, MD decision/surgical intervention, no progress towards goals, refusal/missing three consecutive treatments without notification or medical reason.  GP     Chanin Frumkin Eli Hose 01/13/2017, 1:45 PM Governor Rooks, PTA pager [phone]

## 2017-01-13 NOTE — Progress Notes (Signed)
Physical Therapy Treatment Patient Details Name: Jesus [name] MRN: [id number] DOB: [birth year] Today's Date: 01/13/2017    History of Present Illness  Jesus [name] is a [age] y.o. male with medical history significant of A.Fib, HLD, HTN, sacral decubitus ulcer.  [facility] with c/o confusion and elevated WBC today on routine labs.      PT Comments    Pt limited in mobility due to excessive LE pain.  Emphasis on rolling, transition to sit, time spent in sitting balance.  Attempts to try standing aborted due to pain.    Follow Up Recommendations  SNF     Equipment Recommendations  Other (comment)    Recommendations for Other Services       Precautions / Restrictions Precautions Precautions: Fall    Mobility  Bed Mobility Overal bed mobility: Needs Assistance Bed Mobility: Rolling;Sidelying to Sit;Sit to Sidelying Rolling: Max assist;+2 for physical assistance;+2 for safety/equipment Sidelying to sit: Total assist;+2 for physical assistance     Sit to sidelying: Total assist;+2 for physical assistance General bed mobility comments: pt's R >L LE pain hindering pt's assist  Transfers Overall transfer level: Needs assistance               General transfer comment: Couldn't get pt to agree to try standing due to the pain.  Ambulation/Gait                 Stairs            Wheelchair Mobility    Modified Rankin (Stroke Patients Only)       Balance Overall balance assessment: Needs assistance Sitting-balance support: Bilateral upper extremity supported Sitting balance-Leahy Scale: Fair (to poor) Sitting balance - Comments: sat 10 min at EOB while taking meds.  Pt bobbed around, but held balance.                             Cognition Arousal/Alertness: Awake/alert Behavior During Therapy: Anxious Overall Cognitive Status: Impaired/Different from baseline                  General Comments: appears in delerium    Exercises      General Comments        Pertinent Vitals/Pain Pain Assessment: Faces Faces Pain Scale: Hurts whole lot Pain Location: knees, R Leg Pain Descriptors / Indicators: Grimacing;Moaning Pain Intervention(s): Monitored during session;Limited activity within patient's tolerance    Home Living                      Prior Function            PT Goals (current goals can now be found in the care plan section) Acute Rehab PT Goals Patient Stated Goal: continue rehab at nursing home PT Goal Formulation: With patient Time For Goal Achievement: 01/19/17 Potential to Achieve Goals: Fair Progress towards PT goals: Progressing toward goals (very slow progress, limited by pain)    Frequency    Min 3X/week      PT Plan Current plan remains appropriate    Co-evaluation             End of Session   Activity Tolerance: Patient limited by pain Patient left: in bed;with call bell/phone within reach     Time: 1125-1209 PT Time Calculation (min) (ACUTE ONLY): 44 min  Charges:  $Therapeutic Activity: 38-52 mins  G CodesEliseo Gum Elina Streng 01/13/2017, 1:31 PM 01/13/2017   Bing, PT [phone]  (pager)

## 2017-01-13 NOTE — Progress Notes (Signed)
CM received consult: Long term acute care. CM spoke with pt's [facility]. CM made referral  to [facility] @ [phone] and learned pt doesn't quaiify. Pt need @  least 3 ICU midnights to quailfy.[facility] @ [phone], awaiting approval. CM to f/u with results. Gae GallopAngela Rhyanna Sorce RN,BSN,CM

## 2017-01-13 NOTE — Significant Event (Signed)
Spoke with his daughter [name] over the phone this afternoon. She likes for [facility]. She mentioned that the wound VAC might be a barrier to get [facility] to [facility]. I told her if she likes to take [facility] to [facility] and they said everything okay except the wound VAC we can discontinue the wound VAC. Otherwise and if [facility] says now in [facility]  Clint LippsMutaz A Yates Weisgerber Pager: [phone] 01/13/2017, 3:32 PM

## 2017-01-13 NOTE — Consult Note (Signed)
WOC Nurse wound follow up Wound type: Stage 4 Pressure injury; sacrum with s/p surgical debridement  Measurement:15cmx 20cm x 6cm  Wound bed: 80% pink, 20% yellow, bone palpable Drainage (amount, consistency, odor) moderate, serosanguinous  Periwound: intact, but fragile Dressing procedure/placement/frequency: 1.  Skin prep to entire periwound 2. Drape strip to the left or right hip for foam bridge 3.  Black foam skin bridge to offload TRAC pad to the hip 4.  Filled wound bed with 3 pc. Of black foam 5.  1/2 of 2" barrier ring used at the distal edge of the wound bed in the gluteal crease just above the anus to aid in seal of dressing. 6. Sealed at 125mmHG, patient tolerated well, did receive pain medication prior to the dressing application  4 layer compression wraps to be changed on Wednesday, protect bilateral heel pressure injuries with silicone foam. Protect pressure points with silicone foam or pad pretibial area with ABD pads or extra padding from layer #1 of Profore wraps.   Prevalon boots to be worn at all times while in the bed for offloading heel pressure injuries  Will need low air loss mattress in the SNF for Stage 4 pressure injury  Maximize nutrition for wound healing  If patient up to chair will need offloading cushion   Notified CCS and hospitalist NPWT VAC dressing placed today.  Will require WOC nurse inpatient for changes. If patient remains inpatient I will plant to change VAC again on Friday to adjust schedule for M/W/F schedule   Laurencia Roma [facility] MSN,RN,CWOCN, CNS [phone]

## 2017-01-14 LAB — CBC
HCT: 25 % — ABNORMAL LOW (ref 39.0–52.0)
HEMOGLOBIN: 7.7 g/dL — AB (ref 13.0–17.0)
MCH: 27.2 pg (ref 26.0–34.0)
MCHC: 30.8 g/dL (ref 30.0–36.0)
MCV: 88.3 fL (ref 78.0–100.0)
PLATELETS: 235 10*3/uL (ref 150–400)
RBC: 2.83 MIL/uL — AB (ref 4.22–5.81)
RDW: 18.4 % — ABNORMAL HIGH (ref 11.5–15.5)
WBC: 8.7 10*3/uL (ref 4.0–10.5)

## 2017-01-14 MED ORDER — TORSEMIDE 20 MG PO TABS: 20.0000 mg | ORAL_TABLET | Freq: Every day | ORAL | Status: DC

## 2017-01-14 MED ORDER — ENSURE ENLIVE PO LIQD: 237.0000 mL | Freq: Three times a day (TID) | ORAL | 12 refills | Status: DC

## 2017-01-14 MED ORDER — DIVALPROEX SODIUM 125 MG PO CSDR: 500.0000 mg | DELAYED_RELEASE_CAPSULE | Freq: Every day | ORAL | Status: DC

## 2017-01-14 MED ORDER — QUETIAPINE FUMARATE 25 MG PO TABS: 12.5000 mg | ORAL_TABLET | Freq: Two times a day (BID) | ORAL | 0 refills | Status: DC

## 2017-01-14 MED ORDER — HYDROCODONE-ACETAMINOPHEN 5-325 MG PO TABS
1.0000 | ORAL_TABLET | Freq: Four times a day (QID) | ORAL | 0 refills | Status: DC | PRN
Start: 2017-01-14 — End: 2017-01-22

## 2017-01-14 MED ORDER — HYDROXYZINE HCL 25 MG PO TABS: 25.0000 mg | ORAL_TABLET | Freq: Three times a day (TID) | ORAL | 0 refills | Status: DC | PRN

## 2017-01-14 MED ORDER — THIAMINE HCL 100 MG PO TABS: 100.0000 mg | ORAL_TABLET | Freq: Every day | ORAL | Status: DC

## 2017-01-14 NOTE — Progress Notes (Signed)
[facility] PROGRESS NOTE  Jesus [name] ZOX:[id number] DOB: 1946-08-06 DOA: 12/29/2016  PCP: [name]  Brief History/Interval Summary: Jesus Kingdom Prevetteis a 71 y.o.malewith medical history significant for A.Fib, HLD, HTN, sacral decubitus ulcer. Patient presented to the ED via EMS from [facility] with c/o confusion and elevated WBC on routine labs. Concern was for infection in the sacral decubitus. Patient was hospitalized for management. Patient was placed on broad-spectrum antibiotics. Anticoagulation was held. Patient underwent surgical debridement on 1/26.Patient also noted to have hepatic and pancreatic lesions noted incidentally on CT scan. Palliative care was consulted.  Reason for Visit: Sacral wound infection  Consultants: Gen. surgery. Palliative medicine.  Procedures: Debridement of the sacral wound  Antibiotics: Completed a course of vancomycin and Zosyn  Subjective/Interval History: Patient remains confused and disoriented.  ROS: Unable to do  Objective:  Vital Signs  Vitals:   01/13/17 1631 01/14/17 0012 01/14/17 0636 01/14/17 0907  BP:  116/63 127/72 (!) 131/55  Pulse:  62 72 86  Resp:  (!) 22 18 19   Temp: 98.2 F (36.8 C) 98.7 F (37.1 C) 98.6 F (37 C) 97.7 F (36.5 C)  TempSrc: Axillary  Oral Oral  SpO2:  96% 100% 100%  Weight:  127 kg (280 lb)    Height:        Intake/Output Summary (Last 24 hours) at 01/14/17 1705 Last data filed at 01/14/17 1510  Gross per 24 hour  Intake              350 ml  Output              550 ml  Net             -200 ml   Filed Weights   01/11/17 2218 01/12/17 2245 01/14/17 0012  Weight: (!) 136.2 kg (300 lb 3.2 oz) 131.5 kg (290 lb) 127 kg (280 lb)    General appearance: alert, distracted and no distress Resp: clear to auscultation bilaterally Cardio: regular rate and rhythm, S1, S2 normal, no murmur, click, rub or gallop GI: soft, non-tender; bowel sounds normal; no  masses,  no organomegaly Extremities: venous stasis dermatitis noted Pulses: 2+ and symmetric Neurologic: Remains distracted and confused. No focal deficits.  Lab Results:  Data Reviewed: I have personally reviewed following labs and imaging studies  CBC:  Recent Labs Lab 01/08/17 0629 01/13/17 0818 01/14/17 1055  WBC 9.4 8.9 8.7  HGB 8.4* 7.4* 7.7*  HCT 27.1* 23.3* 25.0*  MCV 89.4 88.3 88.3  PLT 244 234 811    Basic Metabolic Panel:  Recent Labs Lab 01/08/17 0629 01/13/17 0818  NA 141 137  K 3.8 4.3  CL 104 96*  CO2 30 32  GLUCOSE 122* 111*  BUN 27* 22*  CREATININE 1.08 1.11  CALCIUM 9.2 8.9    GFR: Estimated Creatinine Clearance: 82.9 mL/min (by C-G formula based on SCr of 1.11 mg/dL).   Radiology Studies: No results found.   Medications:  Scheduled: . apixaban  5 mg Oral BID  . atorvastatin  80 mg Oral q1800  . carvedilol  6.25 mg Oral BID WC  . divalproex  500 mg Oral QHS  . feeding supplement (ENSURE ENLIVE)  237 mL Oral TID BM  . feeding supplement (PRO-STAT SUGAR FREE 64)  30 mL Oral TID  . LORazepam  1 mg Intravenous Once  . mouth rinse  15 mL Mouth Rinse BID  . pantoprazole sodium  40 mg Oral BID  .  QUEtiapine  12.5 mg Oral BID  . saccharomyces boulardii  250 mg Oral BID  . thiamine  100 mg Oral Daily  . torsemide  20 mg Oral Daily   Continuous:  POE:[id number], HYDROcodone-acetaminophen, HYDROmorphone (DILAUDID) injection, hydrOXYzine  Assessment/Plan:  Principal Problem:   Decubitus ulcer of sacral region, unstageable ([place]) Active Problems:   Atrial fibrillation (HCC)   Chronic venous insufficiency   Chronic diastolic CHF (congestive heart failure) (HCC)   Acute on chronic renal failure (HCC)   Delirium   DNR (do not resuscitate)   Palliative care by specialist   Disorientation    Infected Decubitus ulcer of sacral region -Patient was found to have large sacral decubitus ulcer, unstageable with concerns for infection.    -CT scan did show some air pockets without any evidence for abscess. Started on vancomycin and Zosyn. -Wound care was consulted. Subsequently, general surgery was consulted. Patient's anticoagulation was held.  -Patient underwent surgical debridement 1/26. Patient was placed on vancomycin and Zosyn. Appears to have completed a course of antibiotics. -Patient received hydrotherapy. Now has a wart back. -Needs to improve his nutritional status to heal the wounds. -Palliative medicine met with the family, DNR for now but they want to continue to treat and see how he does.  Acute encephalopathy in the setting of questionable dementia Some of this is due to baseline cognitive impairment. According to patient's son, patient has had issues with memory for a few months. Patient was noted to be on Aricept a few months ago. Currently on Depakote and Seroquel. Mental status has been stable. Workup done here included B-12 level which was normal. TSH normal. RPR and HIV are nonreactive. EEG did not show any epileptiform activity. MRI brain was limited due to motion, however, did not show any acute findings. Atrophy was present. Continue thiamine considering history of alcohol use in the past. Depakote level was nontoxic. Patient's verbalization of wanting to hurt himself, was most likely secondary to pain. Seen by psychiatry.  Per family this is going on for some time but recently worsened, likely worsening dementia.  A.Fib  -CHA2DS2-VASc score is 4. Rate is controlled with Coreg. Patient was on Eliquis, which was held for surgery. -Now resumed   Normocytic anemia  -Hemoglobin noted to have drifted down, but stable. Initially noted to have some bleeding from his wound. Continue to monitor.  Likely mild AKI on CKD stage 3/hypernatremia -Creatinine baseline 1.4, presented with creatinine 1.6 very close to baseline.  -On torsemide. Continue and monitor renal function closely.  Hypernatremia -Sodium is  154, back to normal   Chronic diastolic CHF  -Patient was on torsemide and spironolactone for AKI. Restarted Demadex.  Hepatic and pancreatic lesions -Cystic lesions incidentally noted on CT scan.  -MRI done and showed possible liver cirrhosis, pancreatic lesion does not look malignant, likely benign versus pseudocyst. Considering his overall poor health and possible dementia, he does not merit further evaluation at this time. A repeat MRI could be considered in 6 months due to poor quality of MRI obtained during this hospitalization and if patient is able to better cooperate at that time  Chronic venous insufficiency  Stable.  Goals of care Patient also noted to have generalized deconditioning. His quality of life appears to be poor. His activity level and functional status is not known, but likely poor considering presence of decubitus ulcer. Palliative medicine has seen the patient and discussed with family. Now DO NOT RESUSCITATE.  DVT Prophylaxis: On Apixaban    Code  Status: DO NOT RESUSCITATE  Family Communication: No family at bedside  Disposition Plan: Await placement at [facility] or SNF    LOS: 15 days   [place] [facility] Pager [phone] 01/14/2017, 5:05 PM  If 7PM-7AM, please contact night-coverage at [email or web address], password [facility]

## 2017-01-14 NOTE — Progress Notes (Signed)
Pt had wound vac placed yesterday with plans to change on Friday.  We will look at the wound on Friday with wound vac change.

## 2017-01-14 NOTE — Consult Note (Signed)
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 Stage 2: Left heel: now open 3cm x 4cm x 0.2cm; moist pink center, with some darkening of the surrounding skin; minimal serosanguinous drainage  DTI right pretibial: 4cm x 0.2cm x 0; remains intact purple skin, no drainage DTI right and left dorsal feet has resolved DTI left pretibial resolved Left lateral calf; new area that is weeping/venous stasis ulceration: 3cm x 2cm x 0.1cm; sanguinous drainage, moderate, no odor  Periwound: severe venous dermatitis bilateral LEs Dressing procedure/placement/frequency: Removed all compression layers and foam dressings bilaterally Cleansed both legs well and allow to air dry, removed dry skin bilaterally.  Covered heels with protective 5 layer silicone foam Covered right pretibial area with 5 layer silicone foam Covered left lateral calf with 5 layer silicone foam Padded entire pretibial region bilaterally with soft cotton padding layer (Layer number 1) from Profore dressing kit.  Wrapped both legs with 4 layer compression wraps and replaced Prevalon boots.  [facility] Nurse team will follow along with you for weekly wound assessments and will plan to change VAC dressing again on Friday to get on a M/W/F schedule if not DC to LTAC or SNF.   Please notify me of any acute changes in the wounds or any new areas of concerns [place] MSN, [place], CNS [phone]

## 2017-01-15 LAB — CBC
HCT: 22.8 % — ABNORMAL LOW (ref 39.0–52.0)
Hemoglobin: 7 g/dL — ABNORMAL LOW (ref 13.0–17.0)
MCH: 27.3 pg (ref 26.0–34.0)
MCHC: 30.7 g/dL (ref 30.0–36.0)
MCV: 89.1 fL (ref 78.0–100.0)
PLATELETS: 248 10*3/uL (ref 150–400)
RBC: 2.56 MIL/uL — ABNORMAL LOW (ref 4.22–5.81)
RDW: 18.5 % — ABNORMAL HIGH (ref 11.5–15.5)
WBC: 8.2 10*3/uL (ref 4.0–10.5)

## 2017-01-15 LAB — PREPARE RBC (CROSSMATCH)

## 2017-01-15 LAB — ABO/RH: ABO/RH(D): A POS

## 2017-01-15 MED ORDER — SODIUM CHLORIDE 0.9 % IV SOLN
Freq: Once | INTRAVENOUS | Status: AC
  Administered 2017-01-15: 12:00:00 via INTRAVENOUS

## 2017-01-15 MED ORDER — FUROSEMIDE 10 MG/ML IJ SOLN
20.0000 mg | Freq: Once | INTRAMUSCULAR | Status: AC
  Administered 2017-01-15: 20 mg via INTRAVENOUS
  Filled 2017-01-15: qty 2

## 2017-01-15 MED ORDER — PRO-STAT SUGAR FREE PO LIQD
30.0000 mL | Freq: Four times a day (QID) | ORAL | Status: DC
  Administered 2017-01-15 – 2017-01-23 (×28): 30 mL via ORAL
  Filled 2017-01-15 (×26): qty 30

## 2017-01-15 NOTE — Progress Notes (Signed)
[facility] PROGRESS NOTE  [name] AXE:[id number] DOB: January 30, 1946 DOA: 12/29/2016  PCP: [name]  Brief History/Interval Summary: Jesus Kingdom Prevetteis a 71 y.o.malewith medical history significant for A.Fib, HLD, HTN, sacral decubitus ulcer. Patient presented to the ED via EMS from [facility] with c/o confusion and elevated WBC on routine labs. Concern was for infection in the sacral decubitus. Patient was hospitalized for management. Patient was placed on broad-spectrum antibiotics. Anticoagulation was held. Patient underwent surgical debridement on 1/26.Patient also noted to have hepatic and pancreatic lesions noted incidentally on CT scan. Palliative care was consulted.  Reason for Visit: Sacral wound infection  Consultants: Gen. surgery. Palliative medicine.  Procedures: Debridement of the sacral wound  Antibiotics: Completed a course of vancomycin and Zosyn  Subjective/Interval History: Patient remains confused and disoriented.  ROS: Unable to do  Objective:  Vital Signs  Vitals:   01/14/17 0907 01/14/17 1719 01/14/17 2100 01/15/17 0500  BP: (!) 131/55 (!) 120/50 (!) 126/59 131/60  Pulse: 86 73 83 82  Resp: 19 18    Temp: 97.7 F (36.5 C) 98 F (36.7 C) 98 F (36.7 C) 99.3 F (37.4 C)  TempSrc: Oral Oral Oral Oral  SpO2: 100% 98% 93% 98%  Weight:      Height:        Intake/Output Summary (Last 24 hours) at 01/15/17 0919 Last data filed at 01/15/17 0530  Gross per 24 hour  Intake              570 ml  Output             1275 ml  Net             -705 ml   Filed Weights   01/11/17 2218 01/12/17 2245 01/14/17 0012  Weight: (!) 136.2 kg (300 lb 3.2 oz) 131.5 kg (290 lb) 127 kg (280 lb)    General appearance: alert, distracted and no distress Resp: clear to auscultation bilaterally Cardio: regular rate and rhythm, S1, S2 normal, no murmur, click, rub or gallop GI: soft, non-tender; bowel sounds normal; no masses,   no organomegaly Extremities: venous stasis dermatitis noted Pulses: 2+ and symmetric Neurologic: Remains distracted and confused. No focal deficits.  Lab Results:  Data Reviewed: I have personally reviewed following labs and imaging studies  CBC:  Recent Labs Lab 01/13/17 0818 01/14/17 1055 01/15/17 0609  WBC 8.9 8.7 8.2  HGB 7.4* 7.7* 7.0*  HCT 23.3* 25.0* 22.8*  MCV 88.3 88.3 89.1  PLT 234 235 110    Basic Metabolic Panel:  Recent Labs Lab 01/13/17 0818  NA 137  K 4.3  CL 96*  CO2 32  GLUCOSE 111*  BUN 22*  CREATININE 1.11  CALCIUM 8.9    GFR: Estimated Creatinine Clearance: 82.9 mL/min (by C-G formula based on SCr of 1.11 mg/dL).   Radiology Studies: No results found.   Medications:  Scheduled: . sodium chloride   Intravenous Once  . apixaban  5 mg Oral BID  . atorvastatin  80 mg Oral q1800  . carvedilol  6.25 mg Oral BID WC  . divalproex  500 mg Oral QHS  . feeding supplement (ENSURE ENLIVE)  237 mL Oral TID BM  . feeding supplement (PRO-STAT SUGAR FREE 64)  30 mL Oral TID  . furosemide  20 mg Intravenous Once  . furosemide  20 mg Intravenous Once  . LORazepam  1 mg Intravenous Once  . mouth rinse  15 mL Mouth Rinse  BID  . pantoprazole sodium  40 mg Oral BID  . QUEtiapine  12.5 mg Oral BID  . saccharomyces boulardii  250 mg Oral BID  . thiamine  100 mg Oral Daily  . torsemide  20 mg Oral Daily   Continuous:  NIO:[id number], HYDROcodone-acetaminophen, HYDROmorphone (DILAUDID) injection, hydrOXYzine  Assessment/Plan:  Principal Problem:   Decubitus ulcer of sacral region, unstageable ([place]) Active Problems:   Atrial fibrillation (HCC)   Chronic venous insufficiency   Chronic diastolic CHF (congestive heart failure) (HCC)   Acute on chronic renal failure (HCC)   Delirium   DNR (do not resuscitate)   Palliative care by specialist   Disorientation    Infected Decubitus ulcer of sacral region -Patient was found to have large sacral  decubitus ulcer, unstageable with concerns for infection.  -CT scan did show some air pockets without any evidence for abscess. Started on vancomycin and Zosyn. -Wound care was consulted. Subsequently, general surgery was consulted. Patient's anticoagulation was held.  -Patient underwent surgical debridement 1/26. Patient was placed on vancomycin and Zosyn. Appears to have completed a course of antibiotics. -Patient received hydrotherapy. Now has a wart back. -Needs to improve his nutritional status to heal the wounds. -Palliative medicine met with the family, DNR for now but they want to continue to treat and see how he does.  Acute encephalopathy in the setting of questionable dementia Some of this is due to baseline cognitive impairment. According to patient's son, patient has had issues with memory for a few months. Patient was noted to be on Aricept a few months ago. Currently on Depakote and Seroquel. Mental status has been stable. Workup done here included B-12 level which was normal. TSH normal. RPR and HIV are nonreactive. EEG did not show any epileptiform activity. MRI brain was limited due to motion, however, did not show any acute findings. Atrophy was present. Continue thiamine considering history of alcohol use in the past. Depakote level was nontoxic. Patient's verbalization of wanting to hurt himself, was most likely secondary to pain. Seen by psychiatry.  Per family this is going on for some time but recently worsened, likely worsening dementia.  A.Fib  -CHA2DS2-VASc score is 4. Rate is controlled with Coreg. Patient was on Eliquis, which was held for surgery. -Now resumed   Normocytic anemia  -Hemoglobin noted to have drifted down. No overt bleeding has been noted. Patient to be transfused 2 units today.  Likely mild AKI on CKD stage 3/hypernatremia -Creatinine baseline 1.4, presented with creatinine 1.6 very close to baseline.  -On torsemide. Continue and monitor renal  function closely.  Hypernatremia Resolved.   Chronic diastolic CHF  -Patient was on torsemide and spironolactone for AKI. Restarted Demadex.  Hepatic and pancreatic lesions -Cystic lesions incidentally noted on CT scan.  -MRI done and showed possible liver cirrhosis, pancreatic lesion does not look malignant, likely benign versus pseudocyst. Considering his overall poor health and possible dementia, he does not merit further evaluation at this time. Due to poor quality of MRI obtained during this hospitalization, a repeat MRI could be considered in 6 months if patient is able to better cooperate at that time.  Chronic venous insufficiency  Stable.  Goals of care Patient also noted to have generalized deconditioning. His quality of life appears to be poor. His activity level and functional status is not known, but likely poor considering presence of decubitus ulcer. Palliative medicine has seen the patient and discussed with family. Now DO NOT RESUSCITATE.Will benefit from having palliative medicine  follow the patient at SNF.  DVT Prophylaxis: On Apixaban    Code Status: DO NOT RESUSCITATE  Family Communication: No family at bedside  Disposition Plan: Await placement at [facility] or SNF. Transfusion today. Possible discharge tomorrow.    LOS: 16 days   [place] [facility] Pager [phone] 01/15/2017, 9:19 AM  If 7PM-7AM, please contact night-coverage at [email or web address], password [facility]

## 2017-01-15 NOTE — Progress Notes (Signed)
Physical Therapy Treatment Patient Details Name: Jesus MooresDouglas M Tocco MRN: [id number] DOB: 1946-03-23 Today's Date: 01/15/2017    History of Present Illness  Jesus [name] is a 71 y.o. male with medical history significant of A.Fib, HLD, HTN, sacral decubitus ulcer.  [facility] with c/o confusion and elevated WBC today on routine labs.      PT Comments    Pt limited by pain and delirium to bed mobility, transitions and sitting balance EOB  Follow Up Recommendations  SNF (or LTACH)     Equipment Recommendations  Other (comment)    Recommendations for Other Services       Precautions / Restrictions Precautions Precautions: Fall     Mobility  Bed Mobility Overal bed mobility: Needs Assistance Bed Mobility: Supine to Sit;Sit to Supine     Supine to sit: Total assist;+2 for physical assistance Sit to supine: Total assist;+2 for physical assistance   General bed mobility comments: pt resistive due to pain and delerium  Transfers                 General transfer comment: pt resistive to attempt to stand today.  Ambulation/Gait                 Stairs            Wheelchair Mobility    Modified Rankin (Stroke Patients Only)       Balance Overall balance assessment: Needs assistance Sitting-balance support: Bilateral upper extremity supported;No upper extremity supported Sitting balance-Leahy Scale: Fair Sitting balance - Comments: sat 10 min at EOB with/without UE assist, pt not willing to do any activity EOB in his delerium       Standing balance comment: if pt stays in hospital may need to try tilt table/bed                    Cognition Arousal/Alertness: Awake/alert Behavior During Therapy: Anxious (delerious) Overall Cognitive Status: Impaired/Different from baseline                      Exercises      General Comments General comments (skin integrity, edema,  etc.): Presently limited by extreme R>L LE pain and delerium.      Pertinent Vitals/Pain Pain Assessment: Faces Faces Pain Scale: Hurts whole lot Pain Location: knees, R Leg Pain Descriptors / Indicators: Grimacing;Moaning;Guarding;Sore Pain Intervention(s): Monitored during session;Limited activity within patient's tolerance    Home Living                      Prior Function            PT Goals (current goals can now be found in the care plan section) Acute Rehab PT Goals PT Goal Formulation: With patient Time For Goal Achievement: 01/19/17 Potential to Achieve Goals: Fair Progress towards PT goals: Progressing toward goals (limited due to present delerium)    Frequency    Min 3X/week      PT Plan Current plan remains appropriate    Co-evaluation             End of Session   Activity Tolerance: Patient limited by pain Patient left: in bed;with call bell/phone within reach     Time: 1321-1353 PT Time Calculation (min) (ACUTE ONLY): 32 min  Charges:  $Therapeutic Activity: 23-37 mins  G CodesEliseo Gum Cena Bruhn 01/15/2017, 5:35 PM 01/15/2017  Jesus [name], PT [phone]  (pager)

## 2017-01-15 NOTE — Consult Note (Signed)
WOC follow up on NPWT VAC dressing to the large sacral pressure injury s/p surgical debridement and hydrotherapy.  Today the dressing is maintaining well and intact with good seal.  I have discussed with the bedside nurse will plan to delay change of dressing until tomorrow to allow for dressing change schedule to be [facility].  If patient DC to SNF/LTAC today, ok to change at frequency that works best for facility either today or tomorrow.  At the latest will need to be changed by Friday 01/16/17 then on M/W/F schedule.  Abuk Selleck [facility] MSN,RN,CWOCN, CNS [phone]

## 2017-01-15 NOTE — Progress Notes (Signed)
Paged MD. for patient having a temparature of 100.4 pre blood transfusion.He called back and its o.k. to give blood transfusion.

## 2017-01-15 NOTE — Progress Notes (Signed)
Nutrition Follow-up  DOCUMENTATION CODES:   Morbid obesity  INTERVENTION:  Continue Ensure Enlive po TID, each supplement provides 350 kcal and 20 grams of protein.  Provide 30 ml Prostat po QID, each supplement provides 100 kcal and 15 grams of protein.   Encourage adequate PO intake.   NUTRITION DIAGNOSIS:   Increased nutrient needs related to wound healing as evidenced by estimated needs; ongoing  GOAL:   Patient will meet greater than or equal to 90% of their needs; progressing  MONITOR:   PO intake, Supplement acceptance, Labs, Weight trends, Skin, I & O's  REASON FOR ASSESSMENT:   Low Braden    ASSESSMENT:   71 y.o. male with medical history significant of A.Fib, HLD, HTN, sacral decubitus ulcer. [facility] with c/o confusion and elevated WBC today on routine labs. Pt is confused at baseline according to the nursing staff. He does say his bottom hurts and he thinks he has had a wound for 4-5 months. PMH CKD3, CHF. Pt also presents with AKI on CKD3. Patient underwent surgical debridement on 1/26.  Pt was unavailable, busy with RN during attempted time of visit. Per MD note, pt remains confused and disoriented. Meal completion has been varied from 10-75% with 45% po at lunch today. Pt currently has Ensure and Prostat ordered and has been consuming them more recently. RD to increase Prostat to QID to aid in adequate protein. Per MD, [facility].   Diet Order:  DIET - DYS 1 Room service appropriate? Yes; Fluid consistency: Thin  Skin:  Wound (see comment) (Unstageable to sacrum with VAC, stg 2 to heels)  Last BM:  2/6  Height:   Ht Readings from Last 1 Encounters:  12/29/16 5\' 10"  (1.778 m)    Weight:   Wt Readings from Last 1 Encounters:  01/14/17 280 lb (127 kg)    Ideal Body Weight:  75.45 kg  BMI:  Body mass index is 40.18 kg/m.  Estimated Nutritional Needs:   Kcal:   2000-2300  Protein:  120-140 grams  Fluid:  2-2.3 L/day  EDUCATION NEEDS:   No education needs identified at this time  Roslyn SmilingStephanie Ninah Moccio, MS, RD, LDN Pager # [phone] After hours/ weekend pager # [phone]

## 2017-01-15 NOTE — Progress Notes (Signed)
Speech Language Pathology Treatment: Dysphagia  Patient Details Name: Jesus [name] MRN: [id number] DOB: [birth year] Today's Date: 01/15/2017 Time: 5247-9980 SLP Time Calculation (min) (ACUTE ONLY): 17 min  Assessment / Plan / Recommendation Clinical Impression  Pt delirious throughout dysphagia session with consumption of pudding and thin liquids. Sub optimal positioning due to air mattress and pt's pain level. Dys 1 (puree) is most appropriate given confusion, decreased awareness and ability to safely masticate higher textures. No indications of airway compromise. Prognosis unknown; goals met and will discharge ST at this time. If his condition and cognition improves and is able to attend to higher textures, re consult.    HPI HPI: 71 y.o. male with medical history significant of A.Fib, HLD, HTN, sacral decubitus ulcer.  Patient presents to the ED via EMS from [place] with c/o confusion and elevated WBC today on routine labs.  It is unclear how long patient has been confused for and patient isnt able to recall specific details regarding how long his symptoms have been ongoing.  Does describe severe pain in buttock area and pain "everywhere" when he tries to move his legs (the latter presumably from the known contractures he has).      SLP Plan  All goals met;Discharge SLP treatment due to (comment)     Recommendations  Diet recommendations: Dysphagia 1 (puree);Thin liquid Liquids provided via: Cup;Straw Medication Administration: Crushed with puree Supervision: Staff to assist with self feeding;Full supervision/cueing for compensatory strategies Compensations: Minimize environmental distractions;Slow rate;Small sips/bites;Lingual sweep for clearance of pocketing Postural Changes and/or Swallow Maneuvers: Seated upright 90 degrees                Oral Care Recommendations: Oral care BID Follow up Recommendations: LTACH;24 hour supervision/assistance Plan: All goals  met;Discharge SLP treatment due to (comment)                       Houston Siren 01/15/2017, 9:46 AM  Orbie Pyo Colvin Caroli.Ed [employer] [phone]

## 2017-01-16 DIAGNOSIS — D62 Acute posthemorrhagic anemia: Secondary | ICD-10-CM

## 2017-01-16 LAB — CBC
HCT: 26.9 % — ABNORMAL LOW (ref 39.0–52.0)
HEMATOCRIT: 24.9 % — AB (ref 39.0–52.0)
HEMOGLOBIN: 7.8 g/dL — AB (ref 13.0–17.0)
Hemoglobin: 8.4 g/dL — ABNORMAL LOW (ref 13.0–17.0)
MCH: 27.9 pg (ref 26.0–34.0)
MCH: 27.9 pg (ref 26.0–34.0)
MCHC: 31.3 g/dL (ref 30.0–36.0)
MCHC: 31.2 g/dL (ref 30.0–36.0)
MCV: 88.9 fL (ref 78.0–100.0)
MCV: 89.4 fL (ref 78.0–100.0)
PLATELETS: 281 10*3/uL (ref 150–400)
Platelets: 261 10*3/uL (ref 150–400)
RBC: 2.8 MIL/uL — ABNORMAL LOW (ref 4.22–5.81)
RBC: 3.01 MIL/uL — AB (ref 4.22–5.81)
RDW: 18 % — ABNORMAL HIGH (ref 11.5–15.5)
RDW: 18.1 % — ABNORMAL HIGH (ref 11.5–15.5)
WBC: 7 10*3/uL (ref 4.0–10.5)
WBC: 7.1 10*3/uL (ref 4.0–10.5)

## 2017-01-16 LAB — BASIC METABOLIC PANEL
Anion gap: 11 (ref 5–15)
BUN: 34 mg/dL — AB (ref 6–20)
CHLORIDE: 96 mmol/L — AB (ref 101–111)
CO2: 34 mmol/L — AB (ref 22–32)
CREATININE: 1.29 mg/dL — AB (ref 0.61–1.24)
Calcium: 9.1 mg/dL (ref 8.9–10.3)
GFR calc non Af Amer: 55 mL/min — ABNORMAL LOW (ref >60)
Glucose, Bld: 119 mg/dL — ABNORMAL HIGH (ref 65–99)
Potassium: 3.6 mmol/L (ref 3.5–5.1)
Sodium: 141 mmol/L (ref 135–145)

## 2017-01-16 LAB — TYPE AND SCREEN
BLOOD PRODUCT EXPIRATION DATE: 201802152359
BLOOD PRODUCT EXPIRATION DATE: 201802192359
ISSUE DATE / TIME: 201802081201
ISSUE DATE / TIME: 201802081542
Unit Type and Rh: 6200
Unit Type and Rh: 6200

## 2017-01-16 NOTE — Progress Notes (Signed)
[facility] PROGRESS NOTE  Jesus [name] TDD:[id number] DOB: 02/07/1946 DOA: 12/29/2016  PCP: [name]  Brief History/Interval Summary: Jesus [name] a 71 y.o.malewith medical history significant for A.Fib, HLD, HTN, sacral decubitus ulcer. Patient presented to the ED via EMS from [facility] with c/o confusion and elevated WBC on routine labs. Concern was for infection in the sacral decubitus. Patient was hospitalized for management. Patient was placed on broad-spectrum antibiotics. Anticoagulation was held. Patient underwent surgical debridement on 1/26.Patient also noted to have hepatic and pancreatic lesions noted incidentally on CT scan. Palliative care was consulted.  Reason for Visit: Sacral wound infection  Consultants: Gen. surgery. Palliative medicine.  Procedures: Debridement of the sacral wound  Antibiotics: Completed a course of vancomycin and Zosyn  Subjective/Interval History: Patient remains confused and disoriented. Complains of pain on and off in his back.  ROS: Unable to do  Objective:  Vital Signs  Vitals:   01/15/17 1739 01/15/17 1955 01/15/17 2221 01/16/17 0604  BP: 114/87 122/78 133/77 (!) 142/74  Pulse: 74 80 78 76  Resp: 16 15 20 20   Temp: 98.8 F (37.1 C) 98.7 F (37.1 C) 98 F (36.7 C) 98.3 F (36.8 C)  TempSrc: Oral Oral    SpO2: 100% 98% 94% 94%  Weight:   126.1 kg (278 lb)   Height:        Intake/Output Summary (Last 24 hours) at 01/16/17 0920 Last data filed at 01/16/17 6237  Gross per 24 hour  Intake             2260 ml  Output             2375 ml  Net             -115 ml   Filed Weights   01/12/17 2245 01/14/17 0012 01/15/17 2221  Weight: 131.5 kg (290 lb) 127 kg (280 lb) 126.1 kg (278 lb)    General appearance: alert, distracted and no distress Resp: clear to auscultation bilaterally Cardio: regular rate and rhythm, S1, S2 normal, no murmur, click, rub or gallop GI: soft,  non-tender; bowel sounds normal; no masses,  no organomegaly Extremities: venous stasis dermatitis Present. Both legs are covered with dressing. Pulses: 2+ and symmetric Neurologic: Remains distracted and confused. No focal deficits.  Lab Results:  Data Reviewed: I have personally reviewed following labs and imaging studies  CBC:  Recent Labs Lab 01/13/17 0818 01/14/17 1055 01/15/17 0609 01/16/17 0535  WBC 8.9 8.7 8.2 7.0  HGB 7.4* 7.7* 7.0* 7.8*  HCT 23.3* 25.0* 22.8* 24.9*  MCV 88.3 88.3 89.1 88.9  PLT 234 235 248 628    Basic Metabolic Panel:  Recent Labs Lab 01/13/17 0818 01/16/17 0535  NA 137 141  K 4.3 3.6  CL 96* 96*  CO2 32 34*  GLUCOSE 111* 119*  BUN 22* 34*  CREATININE 1.11 1.29*  CALCIUM 8.9 9.1    GFR: Estimated Creatinine Clearance: 71 mL/min (by C-G formula based on SCr of 1.29 mg/dL (H)).   Radiology Studies: No results found.   Medications:  Scheduled: . apixaban  5 mg Oral BID  . atorvastatin  80 mg Oral q1800  . carvedilol  6.25 mg Oral BID WC  . divalproex  500 mg Oral QHS  . feeding supplement (ENSURE ENLIVE)  237 mL Oral TID BM  . feeding supplement (PRO-STAT SUGAR FREE 64)  30 mL Oral QID  . LORazepam  1 mg Intravenous Once  . mouth  rinse  15 mL Mouth Rinse BID  . pantoprazole sodium  40 mg Oral BID  . QUEtiapine  12.5 mg Oral BID  . saccharomyces boulardii  250 mg Oral BID  . thiamine  100 mg Oral Daily  . torsemide  20 mg Oral Daily   Continuous:  PPJ:[id number], HYDROcodone-acetaminophen, HYDROmorphone (DILAUDID) injection, hydrOXYzine  Assessment/Plan:  Principal Problem:   Decubitus ulcer of sacral region, unstageable ([place]) Active Problems:   Atrial fibrillation (HCC)   Chronic venous insufficiency   Chronic diastolic CHF (congestive heart failure) (HCC)   Acute on chronic renal failure (HCC)   Delirium   DNR (do not resuscitate)   Palliative care by specialist   Disorientation    Infected Decubitus  ulcer of sacral region -Patient was found to have large sacral decubitus ulcer, unstageable with concerns for infection.  -CT scan did show some air pockets without any evidence for abscess. Started on vancomycin and Zosyn. -Wound care was consulted. Subsequently, general surgery was consulted. Patient's anticoagulation was held.  -Patient underwent surgical debridement 1/26. Patient was placed on vancomycin and Zosyn. Appears to have completed a course of antibiotics. -Patient received hydrotherapy. Now has a wound vac. -Needs to improve his nutritional status to heal the wounds. -Palliative medicine met with the family, DNR for now but they want to continue to treat and see how he does.  Acute encephalopathy in the setting of questionable dementia Some of this is due to baseline cognitive impairment. According to patient's son, patient has had issues with memory for a few months. Patient was noted to be on Aricept a few months ago. Currently on Depakote and Seroquel. Mental status has been stable. Workup done here included B-12 level which was normal. TSH normal. RPR and HIV are nonreactive. EEG did not show any epileptiform activity. MRI brain was limited due to motion, however, did not show any acute findings. Atrophy was present. Continue thiamine considering history of alcohol use in the past. Depakote level was nontoxic. Patient's verbalization of wanting to hurt himself, was most likely secondary to pain. Seen by psychiatry and cleared.  Per family his confusion has been going on for some time but recently worsened, likely worsening dementia.  A.Fib  -CHA2DS2-VASc score is 4. Rate is controlled with Coreg. Patient was on Eliquis, which was held for surgery. - It was resumed. However, patient noted to have a lot of bleeding from around his wound. His hemoglobin dropped and he required blood transfusion. We will stop his anticoagulation for now.  Acute blood loss anemia -Hemoglobin drops  significantly yesterday. He was transfused 2 units of blood. Hemoglobin responded only partially. Note of blood noted in the wound VAC. Surgical PA examined the wound and found a lot of bleeding from surrounding desquamated area. I believe the anticoagulation is contributing to this. Repeat hemoglobin later today. Stop his anticoagulation for now.  Likely mild AKI on CKD stage 3/hypernatremia -Creatinine baseline 1.4, presented with creatinine 1.6 very close to baseline. Slight rise in creatinine noted today. Repeat tomorrow. Monitor urine output. -On torsemide. Continue and monitor renal function closely.  Hypernatremia Resolved.   Chronic diastolic CHF  -Patient was on torsemide and spironolactone for AKI. Restarted Demadex.  Hepatic and pancreatic lesions -Cystic lesions incidentally noted on CT scan.  -MRI done and showed possible liver cirrhosis, pancreatic lesion does not look malignant, likely benign versus pseudocyst. Considering his overall poor health and possible dementia, he does not merit further evaluation at this time. Due to poor quality  of MRI obtained during this hospitalization, a repeat MRI could be considered in 6 months if patient is able to better cooperate at that time.  Chronic venous insufficiency  Stable. Wound care.  Goals of care Patient also noted to have generalized deconditioning. His quality of life appears to be poor. His activity level and functional status is not known, but likely poor considering presence of decubitus ulcer. Palliative medicine has seen the patient and discussed with family. Now DO NOT RESUSCITATE.Will benefit from having palliative medicine follow the patient at SNF.  DVT Prophylaxis: On Apixaban    Code Status: DO NOT RESUSCITATE  Family Communication: No family at bedside  Disposition Plan: Patient now with acute blood loss anemia due to bleeding from his wound. Anticoagulation to be stopped today. Repeat hemoglobin today and  tomorrow. He may need more transfusions.    LOS: 17 days   [place] [facility] Pager [phone] 01/16/2017, 9:20 AM  If 7PM-7AM, please contact night-coverage at [email or web address], password [facility]

## 2017-01-16 NOTE — Consult Note (Addendum)
WOC Nurse wound follow up Wound type: Stage 4 Pressure Injury Measurement:15cm x 18.5cm x 5cm with 4cm tunnel at 8 o'clock  Wound bed: 80% pink, 20% scattered slough with some sloughing at the wound edges from 12-2 o'clock  Drainage (amount, consistency, odor) minimal in VAC canister, serosanguinous, wound edges bleed with dressing change Periwound: intact, some mild skin blistering noted at the proximal edge of the VAC drape Dressing procedure/placement/frequency: Old dressing removed, CCS at the bedside for wound assessment with WOC nurse. Skin prep to the periwound Skin protected with VAC drape to the left hip 1pc of black foam used to bridge TRAC pad from the sacral area 3 pc of black foam to fill wound bed 1/2 of 2" barrier ring used at the distal edge of the wound in the gluteal cleft on each buttock Draped and sealed at 125mmHG.  Patient tolerated well.  Required 3 assistants for turning and positioning patient for WOC nurse to place dressing and obtain seal.  Low air loss mattress in place, will need in whatever DC setting patient is sent to.  4 layer compression wraps and silicone foam to the the LE wounds and for venous stasis to be changed every Wednesday.  WOC nurse team will follow along with you for support with LE compression and wound care and VAC dressing changes to the sacral wound M/W/F.  Kazaria Gaertner [facility] MSN,RN,CWOCN, CNS [phone]

## 2017-01-16 NOTE — Progress Notes (Signed)
14 Days Post-Op  Subjective: Very difficult to work with some is confusion and he is sure he is falling and cannot tolerate Rx.    Objective: Vital signs in last 24 hours: Temp:  [97.4 F (36.3 C)-100.4 F (38 C)] 98.3 F (36.8 C) (02/09 0604) Pulse Rate:  [66-80] 76 (02/09 0604) Resp:  [12-20] 20 (02/09 0604) BP: (114-161)/(48-87) 142/74 (02/09 0604) SpO2:  [94 %-100 %] 94 % (02/09 0604) Weight:  [126.1 kg (278 lb)] 126.1 kg (278 lb) (02/08 2221) Last BM Date: 01/15/17 750 PO Transfused  BM x 1 Afebrile, VSS H/H 7.8/25, WBC is normal Creatinine is up some Intake/Output from previous day: 02/08 0701 - 02/09 0700 In: 2260 [P.O.:750; Blood:1510] Out: 2375 [Urine:2350; Drains:25] Intake/Output this shift: No intake/output data recorded.  General appearance: alert, no distress and minimally cooperative, It takes 4 people to do dressing change. Skin: see picture below, he is making progress.    Lots of bleeding from desquamated area.  The base of the wound looks very good.  You can see some slough but a majority of the base is granular.  It has filled in quite a bit since we started this process.     Lab Results:   Recent Labs  01/15/17 0609 01/16/17 0535  WBC 8.2 7.0  HGB 7.0* 7.8*  HCT 22.8* 24.9*  PLT 248 261    BMET  Recent Labs  01/16/17 0535  NA 141  K 3.6  CL 96*  CO2 34*  GLUCOSE 119*  BUN 34*  CREATININE 1.29*  CALCIUM 9.1   PT/INR No results for input(s): LABPROT, INR in the last 72 hours.  No results for input(s): AST, ALT, ALKPHOS, BILITOT, PROT, ALBUMIN in the last 168 hours.   Lipase     Component Value Date/Time   LIPASE 27 12/29/2016 1916     Studies/Results: No results found.  Medications: . apixaban  5 mg Oral BID  . atorvastatin  80 mg Oral q1800  . carvedilol  6.25 mg Oral BID WC  . divalproex  500 mg Oral QHS  . feeding supplement (ENSURE ENLIVE)  237 mL Oral TID BM  . feeding supplement (PRO-STAT SUGAR FREE 64)  30 mL  Oral QID  . LORazepam  1 mg Intravenous Once  . mouth rinse  15 mL Mouth Rinse BID  . pantoprazole sodium  40 mg Oral BID  . QUEtiapine  12.5 mg Oral BID  . saccharomyces boulardii  250 mg Oral BID  . thiamine  100 mg Oral Daily  . torsemide  20 mg Oral Daily    Assessment/Plan Unstageable decubitus S/p DEBRIDMENT OF DECUBITUS ULCER 11 x 14 x 6 cm, 01/02/17, Dr. Antonieta Pertameriz  Wound vac placement 01/14/17 AF on Eliquis Acute delirium Anemia - transfused 01/15/17 Acute on chronic kidney injury Chronic diastolic CHF Hepatic and pancreatic lesions - MRI ordered  Chronic venous insuffiencey  Probable malnutrition - prealbumin 5.3 on 01/06/17 FEN: Cardiac diet ID: [id number] &Vancomycin 12/29/16 =>>day 10 DVT: Una boots, no compression -  Eliquiswas restarted    Plan:  From a surgical standpoint he is making very good progress. It is very hard to move him and get him to the side to do dressing change.  He cannot cooperate very well at this point.  Dr. Rito EhrlichKrishnan is stopping Eliquis for now secondary to anemia.  I do not think he will do well in SNF.         LOS: 17 days    Jesus [name]  01/16/2017 [phone]  

## 2017-01-16 NOTE — Progress Notes (Signed)
CM made aware from [place]/ [facility] ([phone], insurance denial for LTAC. [facility] recommending peer to peer.  CM made MD aware and MD stated willing to do peer to peer once pt stabilizes. Gae GallopAngela Jaisen Wiltrout RN,BSN,CM

## 2017-01-16 NOTE — Clinical Social Work Note (Signed)
CSW continuing to monitor patient's progress and discharge disposition. Insurance denied LTAC. CSW will continue to follow and pursue SNF placement [facility] preferred) if needed.  Genelle BalVanessa Davion Meara, MSW, LCSW Licensed Clinical Social Worker Clinical Social Work Department [facility] [phone]

## 2017-01-17 LAB — BASIC METABOLIC PANEL
ANION GAP: 15 (ref 5–15)
BUN: 33 mg/dL — ABNORMAL HIGH (ref 6–20)
CHLORIDE: 95 mmol/L — AB (ref 101–111)
CO2: 35 mmol/L — AB (ref 22–32)
Calcium: 9.2 mg/dL (ref 8.9–10.3)
Creatinine, Ser: 1.2 mg/dL (ref 0.61–1.24)
GFR calc non Af Amer: 60 mL/min — ABNORMAL LOW (ref >60)
GLUCOSE: 109 mg/dL — AB (ref 65–99)
Potassium: 3.8 mmol/L (ref 3.5–5.1)
Sodium: 145 mmol/L (ref 135–145)

## 2017-01-17 LAB — CBC
HEMATOCRIT: 27.4 % — AB (ref 39.0–52.0)
HEMOGLOBIN: 8.6 g/dL — AB (ref 13.0–17.0)
MCH: 28.2 pg (ref 26.0–34.0)
MCHC: 31.4 g/dL (ref 30.0–36.0)
MCV: 89.8 fL (ref 78.0–100.0)
Platelets: 293 10*3/uL (ref 150–400)
RBC: 3.05 MIL/uL — ABNORMAL LOW (ref 4.22–5.81)
RDW: 18.3 % — ABNORMAL HIGH (ref 11.5–15.5)
WBC: 7.9 10*3/uL (ref 4.0–10.5)

## 2017-01-17 NOTE — Progress Notes (Signed)
150 CC tea colored wound vac drainage from 7a-7p.

## 2017-01-17 NOTE — Progress Notes (Signed)
[facility] PROGRESS NOTE  Jesus [name] IRC:[id number] DOB: [birth year] DOA: 12/29/2016  PCP: [name]  Brief History/Interval Summary: Jesus [name] a 71 y.o.malewith medical history significant for A.Fib, HLD, HTN, sacral decubitus ulcer. Patient presented to the ED via EMS from [facility] with c/o confusion and elevated WBC on routine labs. Concern was for infection in the sacral decubitus. Patient was hospitalized for management. Patient was placed on broad-spectrum antibiotics. Anticoagulation was held. Patient underwent surgical debridement on 1/26.Patient also noted to have hepatic and pancreatic lesions noted incidentally on CT scan. Palliative care was consulted. Patient remains encephalopathic. Family elected to pursue treatment for now. He is now DO NOT RESUSCITATE. He has had some bleeding issues from his wound. Anticoagulation was held.  Reason for Visit: Sacral wound infection  Consultants: Gen. surgery. Palliative medicine.  Procedures: Debridement of the sacral wound  Antibiotics: Completed a course of vancomycin and Zosyn  Subjective/Interval History: Patient remains confused and disoriented. Complains of pain on and off in his back.  ROS: Unable to do  Objective:  Vital Signs  Vitals:   01/16/17 1447 01/16/17 1718 01/16/17 2103 01/17/17 0843  BP: 133/60 (!) 126/54 131/61 128/77  Pulse: 62 73 67 72  Resp:  17 17 18   Temp:  98.1 F (36.7 C) 98.2 F (36.8 C) 98 F (36.7 C)  TempSrc:  Oral Oral Oral  SpO2:  100% 96% 95%  Weight:   127.4 kg (280 lb 13.9 oz)   Height:        Intake/Output Summary (Last 24 hours) at 01/17/17 1328 Last data filed at 01/17/17 0930  Gross per 24 hour  Intake              597 ml  Output             1175 ml  Net             -578 ml   Filed Weights   01/14/17 0012 01/15/17 2221 01/16/17 2103  Weight: 127 kg (280 lb) 126.1 kg (278 lb) 127.4 kg (280 lb 13.9 oz)    General  appearance: alert, distracted and no distress Resp: clear to auscultation bilaterally Cardio: regular rate and rhythm, S1, S2 normal, no murmur, click, rub or gallop GI: soft, non-tender; bowel sounds normal; no masses,  no organomegaly Extremities: venous stasis dermatitis Present. Both legs are covered with dressing. Pulses: 2+ and symmetric Neurologic: Remains distracted and confused. No focal deficits.  Wound VAC continues to show accumulation of red tinged fluid. But apparently this has not been replaced in a few days. So, this could all be reflective of old blood. Container to be switched out today.  Lab Results:  Data Reviewed: I have personally reviewed following labs and imaging studies  CBC:  Recent Labs Lab 01/14/17 1055 01/15/17 0609 01/16/17 0535 01/16/17 1304 01/17/17 0510  WBC 8.7 8.2 7.0 7.1 7.9  HGB 7.7* 7.0* 7.8* 8.4* 8.6*  HCT 25.0* 22.8* 24.9* 26.9* 27.4*  MCV 88.3 89.1 88.9 89.4 89.8  PLT 235 248 261 281 510    Basic Metabolic Panel:  Recent Labs Lab 01/13/17 0818 01/16/17 0535 01/17/17 0510  NA 137 141 145  K 4.3 3.6 3.8  CL 96* 96* 95*  CO2 32 34* 35*  GLUCOSE 111* 119* 109*  BUN 22* 34* 33*  CREATININE 1.11 1.29* 1.20  CALCIUM 8.9 9.1 9.2    GFR: Estimated Creatinine Clearance: 76.8 mL/min (by C-G formula based on SCr  of 1.2 mg/dL).   Radiology Studies: No results found.   Medications:  Scheduled: . atorvastatin  80 mg Oral q1800  . carvedilol  6.25 mg Oral BID WC  . divalproex  500 mg Oral QHS  . feeding supplement (ENSURE ENLIVE)  237 mL Oral TID BM  . feeding supplement (PRO-STAT SUGAR FREE 64)  30 mL Oral QID  . LORazepam  1 mg Intravenous Once  . mouth rinse  15 mL Mouth Rinse BID  . pantoprazole sodium  40 mg Oral BID  . QUEtiapine  12.5 mg Oral BID  . saccharomyces boulardii  250 mg Oral BID  . thiamine  100 mg Oral Daily  . torsemide  20 mg Oral Daily   Continuous:  KKX:[id number], HYDROcodone-acetaminophen,  HYDROmorphone (DILAUDID) injection, hydrOXYzine  Assessment/Plan:  Principal Problem:   Decubitus ulcer of sacral region, unstageable ([place]) Active Problems:   Atrial fibrillation (HCC)   Chronic venous insufficiency   Chronic diastolic CHF (congestive heart failure) (HCC)   Acute on chronic renal failure (HCC)   Delirium   DNR (do not resuscitate)   Palliative care by specialist   Disorientation    Infected Decubitus ulcer of sacral region -Patient was found to have large sacral decubitus ulcer, unstageable with concerns for infection.  -CT scan did show some air pockets without any evidence for abscess. Started on vancomycin and Zosyn. -Wound care was consulted. Subsequently, general surgery was consulted. Patient's anticoagulation was held.  -Patient underwent surgical debridement 1/26. Patient was placed on vancomycin and Zosyn. He has completed a course of antibiotics. -Patient received hydrotherapy. Now has a wound vac. -Needs to improve his nutritional status to heal the wounds. -Palliative medicine met with the family, DNR for now but they want to continue to treat and see how he does. -Patient subsequently started bleeding around the wound area. Required blood transfusion. Anticoagulation has been stopped for now. Continue to monitor.  Acute encephalopathy in the setting of questionable dementia Some of this is due to baseline cognitive impairment. According to patient's son, patient has had issues with memory for a few months. Patient was noted to be on Aricept a few months ago. Currently on Depakote and Seroquel. Mental status has been stable. Workup done here included B-12 level which was normal. TSH normal. RPR and HIV are nonreactive. EEG did not show any epileptiform activity. MRI brain was limited due to motion, however, did not show any acute findings. Atrophy was present. Continue thiamine considering history of alcohol use in the past. Depakote level was nontoxic.  Patient's verbalization of wanting to hurt himself, was most likely secondary to pain. Seen by psychiatry and cleared.  Per family his confusion has been going on for some time but recently worsened, likely worsening dementia.  A.Fib  -CHA2DS2-VASc score is 4. Rate is controlled with Coreg. Patient was on Eliquis, which was held for surgery. - It was resumed. However, patient noted to have a lot of bleeding from around his wound. His hemoglobin dropped and he required blood transfusion. We will stop his anticoagulation for now. Family informed about the risk of stroke.  Acute blood loss anemia -He was transfused 2 units of blood on 2/8. Hemoglobin responded only partially. Note of blood noted in the wound VAC. Surgical PA examined the wound and found a lot of bleeding from surrounding desquamated area. Anticoagulation is contributing to this. This was stopped. Hemoglobin has been stable for the past 24 hours. Continue to monitor.  Likely mild AKI on CKD  stage 3/hypernatremia -Creatinine baseline 1.4, presented with creatinine 1.6 very close to baseline. Creatinine has improved today. -On torsemide. Continue to monitor renal function closely.  Hypernatremia Resolved.   Chronic diastolic CHF  -Patient was on torsemide and spironolactone for AKI. Restarted Demadex.  Hepatic and pancreatic lesions -Cystic lesions incidentally noted on CT scan.  -MRI done and showed possible liver cirrhosis, pancreatic lesion does not look malignant, likely benign versus pseudocyst. Considering his overall poor health and possible dementia, he does not merit further evaluation at this time. Due to poor quality of MRI obtained during this hospitalization, a repeat MRI could be considered in 6 months if patient is able to better cooperate at that time.  Chronic venous insufficiency  Stable. Wound care.  Goals of care Patient also noted to have generalized deconditioning. His quality of life appears to  be poor. His activity level and functional status is not known, but likely poor considering presence of decubitus ulcer. Palliative medicine has seen the patient and discussed with family. Now DO NOT RESUSCITATE.Will benefit from having palliative medicine follow the patient at SNF/LTAC.   DVT Prophylaxis: On Apixaban    Code Status: DO NOT RESUSCITATE  Family Communication: No family at bedside. Discussed with the son in detail today. He was informed about denial from insurance for patient to go to [facility]. Peer to peer call to be done on Monday. He was also told about bleeding issues and need to discontinue anticoagulation for now. Disposition Plan: Continue to monitor hemoglobin. Watch for bleeding.   LOS: 18 days   [place] [facility] Pager [phone] 01/17/2017, 1:28 PM  If 7PM-7AM, please contact night-coverage at [email or web address], password [facility]

## 2017-01-18 LAB — BASIC METABOLIC PANEL
ANION GAP: 11 (ref 5–15)
BUN: 35 mg/dL — ABNORMAL HIGH (ref 6–20)
CALCIUM: 9 mg/dL (ref 8.9–10.3)
CO2: 34 mmol/L — AB (ref 22–32)
Chloride: 100 mmol/L — ABNORMAL LOW (ref 101–111)
Creatinine, Ser: 1.27 mg/dL — ABNORMAL HIGH (ref 0.61–1.24)
GFR calc non Af Amer: 56 mL/min — ABNORMAL LOW (ref >60)
GLUCOSE: 156 mg/dL — AB (ref 65–99)
POTASSIUM: 3.4 mmol/L — AB (ref 3.5–5.1)
Sodium: 145 mmol/L (ref 135–145)

## 2017-01-18 LAB — CBC
HEMATOCRIT: 26.6 % — AB (ref 39.0–52.0)
HEMOGLOBIN: 8.2 g/dL — AB (ref 13.0–17.0)
MCH: 27.8 pg (ref 26.0–34.0)
MCHC: 30.8 g/dL (ref 30.0–36.0)
MCV: 90.2 fL (ref 78.0–100.0)
Platelets: 266 10*3/uL (ref 150–400)
RBC: 2.95 MIL/uL — AB (ref 4.22–5.81)
RDW: 18.6 % — AB (ref 11.5–15.5)
WBC: 7.5 10*3/uL (ref 4.0–10.5)

## 2017-01-18 MED ORDER — POTASSIUM CHLORIDE CRYS ER 20 MEQ PO TBCR
40.0000 meq | EXTENDED_RELEASE_TABLET | Freq: Once | ORAL | Status: AC
  Administered 2017-01-18: 40 meq via ORAL
  Filled 2017-01-18: qty 2

## 2017-01-18 MED ORDER — SODIUM CHLORIDE 0.9 % IV BOLUS (SEPSIS)
500.0000 mL | Freq: Once | INTRAVENOUS | Status: AC
  Administered 2017-01-18: 500 mL via INTRAVENOUS

## 2017-01-18 NOTE — Progress Notes (Signed)
   01/18/17 2104  Vitals  Temp (!) 102.4 F (39.1 C) (RN notified)  Temp Source Oral  BP (!) 147/73  BP Location Left Arm  BP Method Automatic  Patient Position (if appropriate) Lying  Pulse Rate 94  Pulse Rate Source Dinamap  Resp 20  Oxygen Therapy  SpO2 95 %  O2 Device Room Air  Pt's temp is elevated. Merdis DelayK. Schorr NP notified, 500cc NS bolus ordered.

## 2017-01-18 NOTE — Progress Notes (Signed)
[facility] PROGRESS NOTE  [name] QQV:[id number] DOB: [birth year] DOA: 12/29/2016  PCP: [name]  Brief History/Interval Summary: Jesus Kingdom Prevetteis a 71 y.o.malewith medical history significant for A.Fib, HLD, HTN, sacral decubitus ulcer. Patient presented to the ED via EMS from [facility] with c/o confusion and elevated WBC on routine labs. Concern was for infection in the sacral decubitus. Patient was hospitalized for management. Patient was placed on broad-spectrum antibiotics. Anticoagulation was held. Patient underwent surgical debridement on 1/26.Patient also noted to have hepatic and pancreatic lesions noted incidentally on CT scan. Palliative care was consulted. Patient remains encephalopathic. Family elected to pursue treatment for now. He is now DO NOT RESUSCITATE. He again had some bleeding from around his wound. Anticoagulation was held.  Reason for Visit: Sacral wound infection  Consultants: Gen. surgery. Palliative medicine.  Procedures: Debridement of the sacral wound  Antibiotics: Completed a course of vancomycin and Zosyn  Subjective/Interval History: Patient remains confused and disoriented. Complains of pain on and off in his back.  ROS: Unable to do  Objective:  Vital Signs  Vitals:   01/17/17 1715 01/18/17 0000 01/18/17 0430 01/18/17 0834  BP: 135/62 (!) 109/49 133/68 (!) 145/70  Pulse: 68 79 65 72  Resp: 18 16 17 18   Temp: 98.4 F (36.9 C) 97.7 F (36.5 C) 97.4 F (36.3 C) 98.7 F (37.1 C)  TempSrc: Oral Oral Oral Oral  SpO2: 97% 95% 96% 96%  Weight:  126.1 kg (278 lb)    Height:        Intake/Output Summary (Last 24 hours) at 01/18/17 1126 Last data filed at 01/18/17 0700  Gross per 24 hour  Intake              960 ml  Output             1400 ml  Net             -440 ml   Filed Weights   01/15/17 2221 01/16/17 2103 01/18/17 0000  Weight: 126.1 kg (278 lb) 127.4 kg (280 lb 13.9 oz) 126.1 kg (278  lb)    General appearance: alert, distracted and no distress Resp: clear to auscultation bilaterally Cardio: regular rate and rhythm, S1, S2 normal, no murmur, click, rub or gallop GI: soft, non-tender; bowel sounds normal; no masses,  no organomegaly Extremities: venous stasis dermatitis Present. Both legs are covered with dressing. Pulses: 2+ and symmetric Neurologic: Remains distracted and confused. No focal deficits.  Wound VAC continues to show accumulation of red tinged fluid.   Lab Results:  Data Reviewed: I have personally reviewed following labs and imaging studies  CBC:  Recent Labs Lab 01/15/17 0609 01/16/17 0535 01/16/17 1304 01/17/17 0510 01/18/17 0854  WBC 8.2 7.0 7.1 7.9 7.5  HGB 7.0* 7.8* 8.4* 8.6* 8.2*  HCT 22.8* 24.9* 26.9* 27.4* 26.6*  MCV 89.1 88.9 89.4 89.8 90.2  PLT 248 261 281 293 332    Basic Metabolic Panel:  Recent Labs Lab 01/13/17 0818 01/16/17 0535 01/17/17 0510 01/18/17 0854  NA 137 141 145 145  K 4.3 3.6 3.8 3.4*  CL 96* 96* 95* 100*  CO2 32 34* 35* 34*  GLUCOSE 111* 119* 109* 156*  BUN 22* 34* 33* 35*  CREATININE 1.11 1.29* 1.20 1.27*  CALCIUM 8.9 9.1 9.2 9.0    GFR: Estimated Creatinine Clearance: 72.1 mL/min (by C-G formula based on SCr of 1.27 mg/dL (H)).   Radiology Studies: No results found.  Medications:  Scheduled: . atorvastatin  80 mg Oral q1800  . carvedilol  6.25 mg Oral BID WC  . divalproex  500 mg Oral QHS  . feeding supplement (ENSURE ENLIVE)  237 mL Oral TID BM  . feeding supplement (PRO-STAT SUGAR FREE 64)  30 mL Oral QID  . LORazepam  1 mg Intravenous Once  . mouth rinse  15 mL Mouth Rinse BID  . pantoprazole sodium  40 mg Oral BID  . QUEtiapine  12.5 mg Oral BID  . saccharomyces boulardii  250 mg Oral BID  . thiamine  100 mg Oral Daily  . torsemide  20 mg Oral Daily   Continuous:  LKG:[id number], HYDROcodone-acetaminophen, HYDROmorphone (DILAUDID) injection,  hydrOXYzine  Assessment/Plan:  Principal Problem:   Decubitus ulcer of sacral region, unstageable ([place]) Active Problems:   Atrial fibrillation (HCC)   Chronic venous insufficiency   Chronic diastolic CHF (congestive heart failure) (HCC)   Acute on chronic renal failure (HCC)   Delirium   DNR (do not resuscitate)   Palliative care by specialist   Disorientation    Infected Decubitus ulcer of sacral region -Patient was found to have large sacral decubitus ulcer, unstageable with concerns for infection.  -CT scan did show some air pockets without any evidence for abscess. Started on vancomycin and Zosyn. -Wound care was consulted. Subsequently, general surgery was consulted. Patient's anticoagulation was held.  -Patient underwent surgical debridement 1/26. Patient was placed on vancomycin and Zosyn. He has completed a course of antibiotics. -Patient received hydrotherapy. Now has a wound vac. -Needs to improve his nutritional status to heal the wounds. -Palliative medicine met with the family, DNR for now but they want to continue to treat and see how he does. -Patient subsequently started bleeding around the wound area. Required blood transfusion. Anticoagulation has been stopped for now. Seems to be slowing down. Continue to monitor.  Acute encephalopathy in the setting of questionable dementia Some of this is due to baseline cognitive impairment. According to patient's son, patient has had issues with memory for a few months. Patient was noted to be on Aricept a few months ago. Currently on Depakote and Seroquel. Mental status has been stable. Workup done here included B-12 level which was normal. TSH normal. RPR and HIV are nonreactive. EEG did not show any epileptiform activity. MRI brain was limited due to motion, however, did not show any acute findings. Atrophy was present. Continue thiamine considering history of alcohol use in the past. Depakote level was nontoxic. Patient's  verbalization of wanting to hurt himself, was most likely secondary to pain. Seen by psychiatry and cleared. Per family his confusion has been going on for some time but recently worsened, likely worsening dementia.  A.Fib  -CHA2DS2-VASc score is 4. Rate is controlled with Coreg. Patient was on Eliquis, which was held for surgery. - It was resumed. However, patient noted to have a lot of bleeding from around his wound. His hemoglobin dropped and he required blood transfusion. His anticoagulation has been discontinued for now. Family informed about the risk of stroke.  Acute blood loss anemia -He was transfused 2 units of blood on 2/8. Hemoglobin responded only partially. Note of blood noted in the wound VAC. Surgical PA examined the wound and found a lot of bleeding from surrounding desquamated area. Anticoagulation was contributing to this. This was stopped. Hemoglobin has been stable. Continue to monitor.  Likely mild AKI on CKD stage 3/hypernatremia -Creatinine baseline 1.4, presented with creatinine 1.6 very close to baseline.  Creatinine is stable. Replace potassium.  -On torsemide. Continue to monitor renal function closely.  Hypernatremia Resolved.   Chronic diastolic CHF  -Patient was on torsemide and spironolactone for AKI. Restarted Demadex.  Hepatic and pancreatic lesions -Cystic lesions incidentally noted on CT scan.  -MRI done and showed possible liver cirrhosis, pancreatic lesion does not look malignant, likely benign versus pseudocyst. Considering his overall poor health and possible dementia, he does not merit further evaluation at this time. Due to poor quality of MRI obtained during this hospitalization, a repeat MRI could be considered in 6 months if patient is able to better cooperate at that time.  Chronic venous insufficiency  Stable. Wound care.  Goals of care Patient also noted to have generalized deconditioning. His quality of life appears to be poor. His  activity level and functional status is not known, but likely poor considering presence of decubitus ulcer. Palliative medicine has seen the patient and discussed with family. Now DO NOT RESUSCITATE.Will benefit from having palliative medicine follow the patient at SNF/LTAC.   DVT Prophylaxis: Apixaban was stopped due to bleeding. He has bilateral venous stasis with dressing. Cannot use SCDs. Code Status: DO NOT RESUSCITATE  Family Communication: No family at bedside. Discussed with the son in detail 2/10. He was informed about denial from insurance for patient to go to [facility]. Peer to peer call to be done on Monday. He was also told about bleeding issues and need to discontinue anticoagulation for now. Disposition Plan: Continue to monitor hemoglobin. Anticipate going to LTAC or SNF in the next 24-48 hours.   LOS: 19 days   [place] [facility] Pager [phone] 01/18/2017, 11:26 AM  If 7PM-7AM, please contact night-coverage at [email or web address], password [facility]

## 2017-01-18 NOTE — Progress Notes (Signed)
Hemolzed blood draining  from sacral area since this morning at 80 ml level.

## 2017-01-18 NOTE — Progress Notes (Signed)
Wound vac drainage coming from sacral area looks old hemolzed concentrated blood at 200 ml level. M.D made aware.

## 2017-01-19 ENCOUNTER — Inpatient Hospital Stay (HOSPITAL_COMMUNITY): Payer: Medicare HMO

## 2017-01-19 DIAGNOSIS — J690 Pneumonitis due to inhalation of food and vomit: Secondary | ICD-10-CM

## 2017-01-19 DIAGNOSIS — R509 Fever, unspecified: Secondary | ICD-10-CM

## 2017-01-19 LAB — BASIC METABOLIC PANEL
Anion gap: 11 (ref 5–15)
BUN: 38 mg/dL — AB (ref 6–20)
CHLORIDE: 100 mmol/L — AB (ref 101–111)
CO2: 35 mmol/L — ABNORMAL HIGH (ref 22–32)
CREATININE: 1.34 mg/dL — AB (ref 0.61–1.24)
Calcium: 8.8 mg/dL — ABNORMAL LOW (ref 8.9–10.3)
GFR, EST NON AFRICAN AMERICAN: 52 mL/min — AB (ref >60)
Glucose, Bld: 109 mg/dL — ABNORMAL HIGH (ref 65–99)
POTASSIUM: 3.8 mmol/L (ref 3.5–5.1)
SODIUM: 146 mmol/L — AB (ref 135–145)

## 2017-01-19 LAB — URINALYSIS, ROUTINE W REFLEX MICROSCOPIC
Bilirubin Urine: NEGATIVE
GLUCOSE, UA: NEGATIVE mg/dL
Ketones, ur: NEGATIVE mg/dL
Nitrite: NEGATIVE
PH: 6 (ref 5.0–8.0)
Protein, ur: 30 mg/dL — AB
SQUAMOUS EPITHELIAL / LPF: NONE SEEN
Specific Gravity, Urine: 1.016 (ref 1.005–1.030)

## 2017-01-19 LAB — CBC
HCT: 25.7 % — ABNORMAL LOW (ref 39.0–52.0)
HEMOGLOBIN: 8 g/dL — AB (ref 13.0–17.0)
MCH: 28.5 pg (ref 26.0–34.0)
MCHC: 31.1 g/dL (ref 30.0–36.0)
MCV: 91.5 fL (ref 78.0–100.0)
PLATELETS: 252 10*3/uL (ref 150–400)
RBC: 2.81 MIL/uL — AB (ref 4.22–5.81)
RDW: 19.4 % — ABNORMAL HIGH (ref 11.5–15.5)
WBC: 7.7 10*3/uL (ref 4.0–10.5)

## 2017-01-19 LAB — BRAIN NATRIURETIC PEPTIDE: B Natriuretic Peptide: 489.6 pg/mL — ABNORMAL HIGH (ref 0.0–100.0)

## 2017-01-19 LAB — PREPARE RBC (CROSSMATCH)

## 2017-01-19 MED ORDER — SODIUM CHLORIDE 0.9 % IV SOLN: Freq: Once | INTRAVENOUS | Status: DC

## 2017-01-19 MED ORDER — FUROSEMIDE 10 MG/ML IJ SOLN
40.0000 mg | Freq: Once | INTRAMUSCULAR | Status: AC
  Administered 2017-01-19: 40 mg via INTRAVENOUS
  Filled 2017-01-19: qty 4

## 2017-01-19 MED ORDER — SODIUM CHLORIDE 0.9 % IV SOLN
3.0000 g | Freq: Four times a day (QID) | INTRAVENOUS | Status: DC
  Administered 2017-01-19 – 2017-01-21 (×8): 3 g via INTRAVENOUS
  Filled 2017-01-19 (×9): qty 3

## 2017-01-19 MED ORDER — TORSEMIDE 20 MG PO TABS
20.0000 mg | ORAL_TABLET | ORAL | Status: DC
  Administered 2017-01-20 – 2017-01-22 (×2): 20 mg via ORAL
  Filled 2017-01-19: qty 1

## 2017-01-19 MED ORDER — COLLAGENASE 250 UNIT/GM EX OINT
TOPICAL_OINTMENT | Freq: Every day | CUTANEOUS | Status: DC
Start: 2017-01-19 — End: 2017-01-24
  Administered 2017-01-20 – 2017-01-21 (×2): via TOPICAL
  Filled 2017-01-19: qty 30

## 2017-01-19 NOTE — Progress Notes (Signed)
Physical Therapy Treatment Patient Details Name: Jesus [name] MRN: [id number] DOB: [birth year] Today's Date: 01/19/2017    History of Present Illness  Jesus [name] is a 71 y.o. male with medical history significant of A.Fib, HLD, HTN, sacral decubitus ulcer.  Patient presents to the ED via EMS [facility] with c/o confusion and elevated WBC today on routine labs.      PT Comments    Post hydro therapy functional mobility performed to encourage sitting tolerance and challenge seated level activities.  Pt requires cues for sequencing, maintaining task and increased time due to his delerium.  Pt with conversations that don't pertain to topic or don't make sense.  Will continue to recommend Snf/LTACH to decrease care giver burden and maintain safety of patient at next level of care.     Follow Up Recommendations  SNF;LTACH (vs LTACH)     Equipment Recommendations  Other (comment) (TBA )    Recommendations for Other Services       Precautions / Restrictions Precautions Precautions: Fall Restrictions Weight Bearing Restrictions: No    Mobility  Bed Mobility Overal bed mobility: Needs Assistance Bed Mobility: Rolling;Sit to Supine;Sidelying to Sit Rolling: Max assist;+2 for physical assistance;+2 for safety/equipment (able to follow commands to reach for railing and maintain sidelying.  ) Sidelying to sit: Total assist;+2 for physical assistance;HOB elevated (placed in reverse trendelenberg to improve weight shifting to L.  )   Sit to supine: Total assist;+2 for physical assistance (+3 total: +2 for trunk and 3rd person to assist B LEs back to bed.  )   General bed mobility comments: Pt remains resistive but able to tolerate sitting x 6 min and perform limited reaching and weight shifting activities.  Hard to maintain focus and continue activity.  Pt leaning R and required +2 max to maintain sitting edge of bed.  Constant cueing for head control.  Pt talking  of topic through out sitting edge of bed.    Transfers Overall transfer level:  (remains unsafe at this time.  )                  Ambulation/Gait                 Stairs            Wheelchair Mobility    Modified Rankin (Stroke Patients Only)       Balance     Sitting balance-Leahy Scale: Zero Sitting balance - Comments: sat x 6 min with intermittent poor balance and able with assist to reach.         Standing balance comment: if pt stays in hospital may need to try tilt table/bed                    Cognition Arousal/Alertness: Awake/alert Behavior During Therapy: Anxious (Pt remains delerious. ) Overall Cognitive Status: Impaired/Different from baseline                 General Comments: Remains in delerium.      Exercises Other Exercises Other Exercises: Attempted heel slides, hip abd/add, glut sets, patient unable to follow commands.  Pt only able to perform x5 L elbow curls.  Pt remains limited due to cognition.      General Comments        Pertinent Vitals/Pain Pain Assessment: Faces Faces Pain Scale: Hurts little more Pain Location: generalized, likley bottom and LEs ( knees) Pain Descriptors / Indicators: Grimacing;Moaning;Guarding;Sore  Pain Intervention(s): Limited activity within patient's tolerance;Monitored during session    Home Living                      Prior Function            PT Goals (current goals can now be found in the care plan section) Acute Rehab PT Goals Patient Stated Goal: continue rehab at nursing home Potential to Achieve Goals: Fair Progress towards PT goals: Progressing toward goals    Frequency    Min 3X/week      PT Plan Current plan remains appropriate    Co-evaluation             End of Session Equipment Utilized During Treatment: Gait belt Activity Tolerance: Patient limited by pain Patient left: in bed;with call bell/phone within reach     Time:  1355-1413 PT Time Calculation (min) (ACUTE ONLY): 18 min  Charges:  $Therapeutic Activity: 8-22 mins                    G Codes:      Jesus [name] 02-12-17, 2:33 PM Jesus [name], PTA pager [phone]

## 2017-01-19 NOTE — Progress Notes (Signed)
[facility] PROGRESS NOTE  Skiler Olden Charters BEE:[id number] DOB: 17-Apr-1946 DOA: 12/29/2016  PCP: [name]  Brief History/Interval Summary: Jesus Kingdom Prevetteis a 71 y.o.malewith medical history significant for A.Fib, HLD, HTN, sacral decubitus ulcer. Patient presented to the ED via EMS from [facility] with c/o confusion and elevated WBC on routine labs. Concern was for infection in the sacral decubitus. Patient was hospitalized for management. Patient was placed on broad-spectrum antibiotics. Anticoagulation was held. Patient underwent surgical debridement on 1/26.Patient also noted to have hepatic and pancreatic lesions noted incidentally on CT scan. Palliative care was consulted. Patient remains encephalopathic. Family elected to pursue treatment for now. He is now DO NOT RESUSCITATE. He again had some bleeding from around his wound. Anticoagulation was held. Patient developed fever on 2/11.  Reason for Visit: Sacral wound infection.   Consultants: Gen. surgery. Palliative medicine.  Procedures: Debridement of the sacral wound  Antibiotics: Completed a course of vancomycin and Zosyn Unasyn started on 2/12  Subjective/Interval History: Patient remains confused and disoriented.   ROS: Unable to do  Objective:  Vital Signs  Vitals:   01/18/17 2104 01/18/17 2320 01/19/17 0105 01/19/17 0451  BP: (!) 147/73   (!) 152/62  Pulse: 94   98  Resp: 20   20  Temp: (!) 102.4 F (39.1 C) (!) 100.5 F (38.1 C) 98.5 F (36.9 C) 98.9 F (37.2 C)  TempSrc: Oral Oral Oral Oral  SpO2: 95%   94%  Weight: 126.7 kg (279 lb 5.2 oz)     Height:        Intake/Output Summary (Last 24 hours) at 01/19/17 0849 Last data filed at 01/19/17 0648  Gross per 24 hour  Intake             1720 ml  Output             1330 ml  Net              390 ml   Filed Weights   01/16/17 2103 01/18/17 0000 01/18/17 2104  Weight: 127.4 kg (280 lb 13.9 oz) 126.1 kg (278 lb)  126.7 kg (279 lb 5.2 oz)    General appearance: alert, distracted and no distress Resp: Coarse breath sounds bilaterally. Few crackles at the bases. Few wheezes. No rhonchi. Cardio: regular rate and rhythm, S1, S2 normal, no murmur, click, rub or gallop GI: soft, non-tender; bowel sounds normal; no masses,  no organomegaly Extremities: venous stasis dermatitis. Both legs are covered with dressing. Pulses: 2+ and symmetric Neurologic: Remains distracted and confused. No focal deficits.  Wound VAC continues to show accumulation of red colored fluid.   Lab Results:  Data Reviewed: I have personally reviewed following labs and imaging studies  CBC:  Recent Labs Lab 01/16/17 0535 01/16/17 1304 01/17/17 0510 01/18/17 0854 01/19/17 0524  WBC 7.0 7.1 7.9 7.5 7.7  HGB 7.8* 8.4* 8.6* 8.2* 8.0*  HCT 24.9* 26.9* 27.4* 26.6* 25.7*  MCV 88.9 89.4 89.8 90.2 91.5  PLT 261 281 293 266 588    Basic Metabolic Panel:  Recent Labs Lab 01/13/17 0818 01/16/17 0535 01/17/17 0510 01/18/17 0854 01/19/17 0524  NA 137 141 145 145 146*  K 4.3 3.6 3.8 3.4* 3.8  CL 96* 96* 95* 100* 100*  CO2 32 34* 35* 34* 35*  GLUCOSE 111* 119* 109* 156* 109*  BUN 22* 34* 33* 35* 38*  CREATININE 1.11 1.29* 1.20 1.27* 1.34*  CALCIUM 8.9 9.1 9.2 9.0 8.8*  GFR: Estimated Creatinine Clearance: 68.6 mL/min (by C-G formula based on SCr of 1.34 mg/dL (H)).   Radiology Studies: No results found.   Medications:  Scheduled: . sodium chloride   Intravenous Once  . atorvastatin  80 mg Oral q1800  . carvedilol  6.25 mg Oral BID WC  . divalproex  500 mg Oral QHS  . feeding supplement (ENSURE ENLIVE)  237 mL Oral TID BM  . feeding supplement (PRO-STAT SUGAR FREE 64)  30 mL Oral QID  . LORazepam  1 mg Intravenous Once  . mouth rinse  15 mL Mouth Rinse BID  . pantoprazole sodium  40 mg Oral BID  . QUEtiapine  12.5 mg Oral BID  . saccharomyces boulardii  250 mg Oral BID  . thiamine  100 mg Oral Daily  .  [START ON 01/20/2017] torsemide  20 mg Oral QODAY   Continuous:  ZES:[id number], HYDROcodone-acetaminophen, HYDROmorphone (DILAUDID) injection, hydrOXYzine  Assessment/Plan:  Principal Problem:   Decubitus ulcer of sacral region, unstageable ([place]) Active Problems:   Atrial fibrillation (HCC)   Chronic venous insufficiency   Chronic diastolic CHF (congestive heart failure) (HCC)   Acute on chronic renal failure (HCC)   Delirium   DNR (do not resuscitate)   Palliative care by specialist   Disorientation    Infected Decubitus ulcer of sacral region -Patient was found to have large sacral decubitus ulcer, unstageable with concerns for infection.  -CT scan did show some air pockets without any evidence for abscess. Started on vancomycin and Zosyn. -Wound care was consulted. Subsequently, general surgery was consulted. Patient's anticoagulation was held.  -Patient underwent surgical debridement 1/26. Patient was placed on vancomycin and Zosyn. He completed a course of these antibiotics. -Patient received hydrotherapy. Now has a wound vac. -Needs to improve his nutritional status to heal the wounds. -Palliative medicine met with the family, DNR for now but they want to continue to treat and see how he does. -Patient subsequently started bleeding around the wound area. Required blood transfusion. Anticoagulation has been stopped for now. Wound to be reexamined by surgery today.  Fever with abnormal chest x-ray  Noted to have a fever last night. This is concerning for new infection as the wound apparently does not show any evidence for infection. Chest x-ray was done which raised concern for edema versus infection. Patient does have dysphagia and is at high risk for aspiration. That is one possibility. We will also check BNP. Give 1 dose of Lasix. For now, initiate Unasyn. Follow-up on UA.  Acute encephalopathy in the setting of questionable dementia Some of this is due to baseline  cognitive impairment. According to patient's son, patient has had issues with memory for a few months. Patient was noted to be on Aricept a few months ago. Currently on Depakote and Seroquel. Mental status has been stable. Workup done here included B-12 level which was normal. TSH normal. RPR and HIV are nonreactive. EEG did not show any epileptiform activity. MRI brain was limited due to motion, however, did not show any acute findings. Atrophy was present. Continue thiamine considering history of alcohol use in the past. Depakote level was nontoxic. Patient's verbalization of wanting to hurt himself, was most likely secondary to pain. Seen by psychiatry and cleared. Per family his confusion has been going on for some time but recently worsened, likely worsening dementia.  A.Fib  -CHA2DS2-VASc score is 4. Rate is controlled with Coreg. Patient was on Eliquis, which was held for surgery. - It was resumed. However,  patient noted to have a lot of bleeding from around his wound. His hemoglobin dropped and he required blood transfusion. His anticoagulation has been discontinued for now. Family informed about the risk of stroke.  Acute blood loss anemia -He was transfused 2 units of blood on 2/8. Hemoglobin responded only partially. Note of blood noted in the wound VAC. Surgical PA examined the wound and found a lot of bleeding from surrounding desquamated area. Anticoagulation was contributing to this. This was stopped. Hemoglobin has been stable for the most part. Some downward trend noted today. We will transfuse him an additional unit of blood.  Likely mild AKI on CKD stage 3/hypernatremia -Creatinine baseline 1.4, presented with creatinine 1.6 very close to baseline. Creatinine noted to be trending up over the last 48 hours. He is noted to have good urine output. Monitor for now.   Hypernatremia Improved.  Chronic diastolic CHF  -Patient was on torsemide and spironolactone for AKI. Restarted  Demadex. Hold for today due to climbing creatinine.  Hepatic and pancreatic lesions -Cystic lesions incidentally noted on CT scan.  -MRI done and showed possible liver cirrhosis, pancreatic lesion does not look malignant, likely benign versus pseudocyst. Considering his overall poor health and possible dementia, he does not merit further evaluation at this time. Due to poor quality of MRI obtained during this hospitalization, a repeat MRI could be considered in 6 months if patient is able to better cooperate at that time.  Chronic venous insufficiency  Stable. Wound care. Per nursing staff, patient's oral intake has been good.  Goals of care Patient also noted to have generalized deconditioning. His quality of life appears to be poor. His activity level and functional status is not known, but likely poor considering presence of decubitus ulcer. Palliative medicine has seen the patient and discussed with family. Now DO NOT RESUSCITATE.She was not progressing well. Now with new fever. May need to have palliative medicine reevaluate patient and discuss again with family.  DVT Prophylaxis: Apixaban was stopped due to bleeding. He has bilateral venous stasis with dressing. Cannot use SCDs. Code Status: DO NOT RESUSCITATE  Family Communication: No family at bedside. Discussed with the son in detail 2/10. He was informed about denial from insurance for patient to go to [place]. He was also told about bleeding issues and need to stop anticoagulation for now. Disposition Plan: Fever last night is a setback. Chest x-ray is noted to be abnormal. Started back on IV antibiotics..    LOS: 20 days   [facility] Pager [phone] 01/19/2017, 8:49 AM  If 7PM-7AM, please contact night-coverage at [email or web address], password [facility]

## 2017-01-19 NOTE — Consult Note (Signed)
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  Hgb down and patient was transfused.  Does not appear to be actively bleeding today. Periwound: more skin blistering today with VAC drape. Will add skin protectant before replacement of VAC drape. Dressing procedure/placement/frequency:  Returned to see patient with CCS for bedside debridement, however yellow tissue in the base is quite adherent.  I have discussed with surgery team and requested that [facility] and restart hydrotherapy to clean up a bit.  I will reevaluate with PT later this week.  Patient could definitely benefit from combination of hydrotherapy with NPWT.  I will discuss with PT and possibly start VAC back on Friday and continue hydrotherapy with VAC changes on M/W/F.  Notified department director of needs, feel that LTAC still is best option for this patient with complex wound care and LE compression and wound care.  WOC nurse will follow up with PT on Thursday for re-eval for replacement of NPWT VAC dressing to the sacrum.  Contacted PT-Amy to restart hydrotherapy today.  Bedside nurse aware of plans to restart hydrotherapy and to DC [facility] for now.   Huie Ghuman [facility] MSN,RN,CWOCN, CNS [phone]

## 2017-01-19 NOTE — Progress Notes (Signed)
Hydrotherapy done by P.T.

## 2017-01-19 NOTE — Progress Notes (Signed)
Pharmacy Antibiotic Note  Jesus [name] is a 71 y.o. male admitted on 12/29/16 with altered mental status.   Pharmacy previously assisted with Vancomycin and Zosyn dosing for sacral wound infection. Completed 14 days of Vanc and Zosyn on 01/11/17. To begin Unasyn today for aspiration pneumonia coverage.  Tmax 102.4, WBC 7.7.  Scr 1.34.  Plan:  Unasyn 3gm IV q6hrs.  Will follow renal function, any culture data, clinical progress, and length of therapy.  Height: 5\' 10"  (177.8 cm) Weight: 279 lb 5.2 oz (126.7 kg) IBW/kg (Calculated) : 73  Temp (24hrs), Avg:99.5 F (37.5 C), Min:98.3 F (36.8 C), Max:102.4 F (39.1 C)   Recent Labs Lab 01/13/17 0818  01/16/17 0535 01/16/17 1304 01/17/17 0510 01/18/17 0854 01/19/17 0524  WBC 8.9  < > 7.0 7.1 7.9 7.5 7.7  CREATININE 1.11  --  1.29*  --  1.20 1.27* 1.34*  < > = values in this interval not displayed.  Estimated Creatinine Clearance: 68.6 mL/min (by C-G formula based on SCr of 1.34 mg/dL (H)).    Allergies  Allergen Reactions  . Amlodipine Besylate Swelling and Other (See Comments)    "started off low and ended up severe; if, in fact, that is what's causing the swelling" (06/03/12)  . Levaquin [Levofloxacin In D5w] Rash    Antimicrobials this admission: Vanc 1/22>>2/4 Zosyn 1/23>>2/4 Unasyn 2/12>> CHG baths/Mupirocin nasal 1/30>>2/3  Dose adjustments this admission: 1/29 Vanc trough = 36 (drawn correctly) - on 1gm IV q12hrs -> adjusted to 1gm IV q24h 2/1 vanc trough = 16 mcg/ml, at goal of 15-20 mcg/ml  Microbiology results: 1/22 blood x 2 : negative 1/26 MRSA PCR: positive  Thank you for allowing pharmacy to be a part of this patient's care.  Dennie Fettersgan, Jesus [name] Donovan, Jesus [name] Pager: [phone] 01/19/2017 11:48 AM

## 2017-01-19 NOTE — Progress Notes (Addendum)
Physical Therapy Wound Evaluation/Treatment Patient Details  Name: Jesus [name] MRN: [id number] Date of Birth: January 22, 1946  Today's Date: 01/19/2017 Time: 1315-1400 Time Calculation (min): 45 min  Subjective  Subjective: Pt appears confused at times, however generally agreeable to hydrotherapy.  Patient and Family Stated Goals: Pt did not state goals during session Date of Onset:  (Chronic) Prior Treatments: I&D 01/02/17, wound vac  Pain Score:  Pt was premedicated. Appeared to tolerate treatment fairly well with minimal pain reported.  Wound Assessment  Pressure Injury 12/29/16 Unstageable - Full thickness tissue loss in which the base of the ulcer is covered by slough (yellow, tan, gray, green or brown) and/or eschar (tan, brown or black) in the wound bed. (Active)  Dressing Type ABD;Barrier Film (skin prep);Gauze (Comment);Moist to dry 01/19/2017  2:59 PM  Dressing Clean;Dry;Intact 01/19/2017  2:59 PM  Dressing Change Frequency Daily 01/19/2017  2:59 PM  State of Healing Early/partial granulation 01/19/2017  2:59 PM  Site / Wound Assessment Red;Yellow;Black 01/19/2017  2:59 PM  % Wound base Red or Granulating 50% 01/19/2017  2:59 PM  % Wound base Yellow/Fibrinous Exudate 40% 01/19/2017  2:59 PM  % Wound base Black/Eschar 10% 01/19/2017  2:59 PM  % Wound base Other/Granulation Tissue (Comment) 0% 01/19/2017  2:59 PM  Peri-wound Assessment Erythema (blanchable) 01/19/2017  2:59 PM  Wound Length (cm) 15.2 cm 01/19/2017  2:59 PM  Wound Width (cm) 17.4 cm 01/19/2017  2:59 PM  Wound Depth (cm) 6.3 cm 01/19/2017  2:59 PM  Undermining (cm) 5 cm at 10:00, 3.2 cm at 4:00, 5.2 cm at 9:00, 3.6 cm at 7:00. 01/19/2017  2:59 PM  Margins Unattached edges (unapproximated) 01/19/2017  2:59 PM  Drainage Amount Moderate 01/19/2017  2:59 PM  Drainage Description Serous;Serosanguineous 01/19/2017  2:59 PM  Treatment Debridement (Selective);Hydrotherapy (Pulse lavage);Packing (Saline gauze) 01/19/2017  2:59 PM    Hydrotherapy Pulsed lavage therapy - wound location: Sacrum Pulsed Lavage with Suction (psi): 12 psi Pulsed Lavage with Suction - Normal Saline Used: 1000 mL Pulsed Lavage Tip: Tip with splash shield Selective Debridement Selective Debridement - Location: Sacrum Selective Debridement - Tools Used: Forceps;Scalpel Selective Debridement - Tissue Removed: Yellow and black necrotic tissue   Wound Assessment and Plan  Wound Therapy - Assess/Plan/Recommendations Wound Therapy - Clinical Statement: Pt presents to hydrotherapy s/p wound VAC removal. Pt's wife and daughter were present during session and asked to take pictures of the wound after dressing was initially removed. Pt will benefit from continued hydrotherapy for selective removal of unviable tissue and to promote wound bed healing. Per [place] note, the wound will be reassessed on 2/16 for appropriateness for wound VAC application.   Wound Therapy - Functional Problem List: Decreased tolerance for OOB, bed mobility; pain Factors Delaying/Impairing Wound Healing: Altered sensation;Immobility;Multiple medical problems;Vascular compromise Hydrotherapy Plan: Debridement;Dressing change;Patient/family education;Pulsatile lavage with suction Wound Therapy - Frequency: 6X / week Wound Therapy - Follow Up Recommendations: Skilled nursing facility Wound Plan: See above  Wound Therapy Goals- Improve the function of patient's integumentary system by progressing the wound(s) through the phases of wound healing (inflammation - proliferation - remodeling) by: Decrease Necrotic Tissue to: 25% Decrease Necrotic Tissue - Progress: Goal set today Increase Granulation Tissue to: 75% Increase Granulation Tissue - Progress: Goal set today Goals/treatment plan/discharge plan were made with and agreed upon by patient/family: Yes Time For Goal Achievement: 7 days Wound Therapy - Potential for Goals: Good  Goals will be updated until maximal potential  achieved or discharge criteria met.  Discharge criteria: when goals achieved, discharge from hospital, MD decision/surgical intervention, no progress towards goals, refusal/missing three consecutive treatments without notification or medical reason.  GP     Thelma Comp 01/19/2017, 3:13 PM   Rolinda Roan, PT, DPT Acute Rehabilitation Services Pager: [phone]

## 2017-01-19 NOTE — Progress Notes (Signed)
One new  Intact blister formation on the patient's left mid buttocks,just below is a new small ruptured blister -patial loss of dermis presenting as shallow ulcer with a red/pink bed without slough measuring 1.5 x 2 x .1. Dressed with pink foam.This small wound is different from the one I documented from the flow sheet.Noticed also three new  Intact blisters formation on right gluteal fold.

## 2017-01-19 NOTE — Progress Notes (Addendum)
[facility] Progress Note  17 Days Post-Op  Subjective: Buttock pain the same. Tolerating vac changes but requires much assistance to role in bed.  Objective: Vital signs in last 24 hours: Temp:  [98.3 F (36.8 C)-102.4 F (39.1 C)] 98.3 F (36.8 C) (02/12 0910) Pulse Rate:  [82-98] 82 (02/12 0910) Resp:  [18-20] 19 (02/12 0910) BP: (116-152)/(59-75) 116/75 (02/12 0910) SpO2:  [94 %-97 %] 94 % (02/12 0910) Weight:  [279 lb 5.2 oz (126.7 kg)] 279 lb 5.2 oz (126.7 kg) (02/11 2104) Last BM Date: 01/17/17  Intake/Output from previous day: 02/11 0701 - 02/12 0700 In: 1720 [P.O.:1100; IV Piggyback:500] Out: 1330 [Urine:1280; Drains:50] Intake/Output this shift: No intake/output data recorded.  PE: Gen:  Alert, NAD, pleasant, cooperative Pulm:  Effort normal Abd: Soft, NT/ND, +BS, no HSM Buttock wound: soft yellow tissue at base of wound, wound edges are clean, small area central aspect left side of wound with necrotic tissue >> debrided at bedside     Lab Results:   Recent Labs  01/18/17 0854 01/19/17 0524  WBC 7.5 7.7  HGB 8.2* 8.0*  HCT 26.6* 25.7*  PLT 266 252   BMET  Recent Labs  01/18/17 0854 01/19/17 0524  NA 145 146*  K 3.4* 3.8  CL 100* 100*  CO2 34* 35*  GLUCOSE 156* 109*  BUN 35* 38*  CREATININE 1.27* 1.34*  CALCIUM 9.0 8.8*   PT/INR No results for input(s): LABPROT, INR in the last 72 hours. CMP     Component Value Date/Time   NA 146 (H) 01/19/2017 0524   NA 133 (A) 12/04/2016   K 3.8 01/19/2017 0524   CL 100 (L) 01/19/2017 0524   CO2 35 (H) 01/19/2017 0524   GLUCOSE 109 (H) 01/19/2017 0524   BUN 38 (H) 01/19/2017 0524   BUN 38 (A) 12/04/2016   CREATININE 1.34 (H) 01/19/2017 0524   CALCIUM 8.8 (L) 01/19/2017 0524   PROT 6.3 (L) 01/01/2017 0518   ALBUMIN 1.9 (L) 01/01/2017 0518   AST 23 01/01/2017 0518   ALT 20 01/01/2017 0518   ALKPHOS 171 (H) 01/01/2017 0518   BILITOT 1.5 (H) 01/01/2017 0518   GFRNONAA 52 (L)  01/19/2017 0524   GFRAA >60 01/19/2017 0524   Lipase     Component Value Date/Time   LIPASE 27 12/29/2016 1916       Studies/Results: Dg Chest Port 1 View  Result Date: 01/19/2017 CLINICAL DATA:  Fever. EXAM: PORTABLE CHEST 1 VIEW COMPARISON:  Radiographs of December 29, 2016. FINDINGS: Stable cardiomegaly. No pneumothorax or pleural effusion is noted. Increased central pulmonary vascular congestion and bilateral interstitial densities are noted concerning for pulmonary edema or possibly interstitial inflammation. Bony thorax is unremarkable. IMPRESSION: Increased central pulmonary vascular congestion is noted with increased bilateral interstitial densities concerning for pulmonary edema or possibly interstitial inflammation. Electronically Signed   By: Lupita Raider, M.D.   On: 01/19/2017 10:20    Anti-infectives: Anti-infectives    Start     Dose/Rate Route Frequency Ordered Stop   01/05/17 2000  vancomycin (VANCOCIN) IVPB 1000 mg/200 mL premix     1,000 mg 200 mL/hr over 60 Minutes Intravenous Every 24 hours 01/05/17 1027 01/11/17 2231   01/01/17 2000  vancomycin (VANCOCIN) IVPB 1000 mg/200 mL premix  Status:  Discontinued     1,000 mg 200 mL/hr over 60 Minutes Intravenous Every 12 hours 01/01/17 1314 01/05/17 1010   12/30/16 2200  vancomycin (VANCOCIN) 1,500 mg in sodium chloride 0.9 %  500 mL IVPB  Status:  Discontinued     1,500 mg 250 mL/hr over 120 Minutes Intravenous Every 24 hours 12/29/16 2029 01/01/17 1314   12/30/16 0600  Ampicillin-Sulbactam (UNASYN) 3 g in sodium chloride 0.9 % 100 mL IVPB  Status:  Discontinued     3 g 200 mL/hr over 30 Minutes Intravenous Every 8 hours 12/30/16 0518 12/30/16 0537   12/30/16 0600  piperacillin-tazobactam (ZOSYN) IVPB 3.375 g     3.375 g 12.5 mL/hr over 240 Minutes Intravenous Every 8 hours 12/30/16 0537 01/11/17 2359   12/29/16 2030  vancomycin (VANCOCIN) 2,000 mg in sodium chloride 0.9 % 500 mL IVPB     2,000 mg 250 mL/hr over  120 Minutes Intravenous  Once 12/29/16 2028 12/29/16 2306       Assessment/Plan Large infected sacral decub ulcer S/p debridement 1/26 Dr. Derrell Lollingamirez - POD 17  Plan - wound vac taken down and wound evaluated with WOC. Plan to reconsult PT for hydrotherapy x3 days with BID wet to dry dressing changes. Will reassess in about 3 days for possible replacement of wound vac.   LOS: 20 days    Jerre SimonJessica L Focht , [facility] 01/19/2017, 10:24 AM Pager: [phone] Consults: [phone] Mon-Fri 7:00 am-4:30 pm Sat-Sun 7:00 am-11:30 am

## 2017-01-20 ENCOUNTER — Inpatient Hospital Stay (HOSPITAL_COMMUNITY): Payer: Medicare HMO

## 2017-01-20 DIAGNOSIS — I5033 Acute on chronic diastolic (congestive) heart failure: Secondary | ICD-10-CM

## 2017-01-20 LAB — TYPE AND SCREEN
ABO/RH(D): A POS
Antibody Screen: NEGATIVE
UNIT DIVISION: 0

## 2017-01-20 LAB — CBC
HEMATOCRIT: 29.3 % — AB (ref 39.0–52.0)
HEMOGLOBIN: 8.9 g/dL — AB (ref 13.0–17.0)
MCH: 27.4 pg (ref 26.0–34.0)
MCHC: 30.4 g/dL (ref 30.0–36.0)
MCV: 90.2 fL (ref 78.0–100.0)
Platelets: 243 10*3/uL (ref 150–400)
RBC: 3.25 MIL/uL — AB (ref 4.22–5.81)
RDW: 18.4 % — ABNORMAL HIGH (ref 11.5–15.5)
WBC: 8 10*3/uL (ref 4.0–10.5)

## 2017-01-20 LAB — BASIC METABOLIC PANEL
ANION GAP: 12 (ref 5–15)
BUN: 38 mg/dL — ABNORMAL HIGH (ref 6–20)
CO2: 34 mmol/L — ABNORMAL HIGH (ref 22–32)
Calcium: 8.5 mg/dL — ABNORMAL LOW (ref 8.9–10.3)
Chloride: 96 mmol/L — ABNORMAL LOW (ref 101–111)
Creatinine, Ser: 1.31 mg/dL — ABNORMAL HIGH (ref 0.61–1.24)
GFR, EST NON AFRICAN AMERICAN: 54 mL/min — AB (ref >60)
GLUCOSE: 154 mg/dL — AB (ref 65–99)
POTASSIUM: 3.2 mmol/L — AB (ref 3.5–5.1)
Sodium: 142 mmol/L (ref 135–145)

## 2017-01-20 MED ORDER — PANTOPRAZOLE SODIUM 40 MG PO TBEC
40.0000 mg | DELAYED_RELEASE_TABLET | Freq: Two times a day (BID) | ORAL | Status: DC
  Administered 2017-01-20 – 2017-01-23 (×5): 40 mg via ORAL
  Filled 2017-01-20 (×6): qty 1

## 2017-01-20 MED ORDER — POTASSIUM CHLORIDE CRYS ER 20 MEQ PO TBCR
40.0000 meq | EXTENDED_RELEASE_TABLET | Freq: Two times a day (BID) | ORAL | Status: AC
Start: 2017-01-20 — End: 2017-01-21
  Administered 2017-01-20 – 2017-01-21 (×4): 40 meq via ORAL
  Filled 2017-01-20 (×4): qty 2

## 2017-01-20 MED ORDER — FUROSEMIDE 10 MG/ML IJ SOLN
40.0000 mg | Freq: Once | INTRAMUSCULAR | Status: AC
  Administered 2017-01-20: 40 mg via INTRAVENOUS
  Filled 2017-01-20: qty 4

## 2017-01-20 NOTE — Progress Notes (Signed)
Daily Progress Note   Patient Name: Jesus [name]       Date: 01/20/2017 DOB: 02-23-1946  Age: 71 y.o. MRN#: [id number] Attending Physician: Osvaldo Shipper, MD Primary Care Physician: Miki Kins Admit Date: 12/29/2016  Reason for Consultation/Follow-up: Establishing goals of care and Psychosocial/spiritual support  Subjective:  -visited patient at the bedside, he is alert and oriented to person, place and situation.  Spoke with [name] his sister by phone and left message for daughter/HPOA [facility] offering support and f/u from previous meetings  - continued conversation to  GOC, and options.  Hopeful for placement for long term wound care    - The difference between a aggressive medical intervention path  and a palliative comfort care path for this patient at this time was had.     Family encouraged to call with questions or concerns.  PMT will continue to follow for needs  Length of Stay: 21  Current Medications: Scheduled Meds:  . sodium chloride   Intravenous Once  . ampicillin-sulbactam (UNASYN) IV  3 g Intravenous Q6H  . atorvastatin  80 mg Oral q1800  . carvedilol  6.25 mg Oral BID WC  . collagenase   Topical Daily  . divalproex  500 mg Oral QHS  . feeding supplement (ENSURE ENLIVE)  237 mL Oral TID BM  . feeding supplement (PRO-STAT SUGAR FREE 64)  30 mL Oral QID  . LORazepam  1 mg Intravenous Once  . mouth rinse  15 mL Mouth Rinse BID  . pantoprazole sodium  40 mg Oral BID  . QUEtiapine  12.5 mg Oral BID  . saccharomyces boulardii  250 mg Oral BID  . thiamine  100 mg Oral Daily  . torsemide  20 mg Oral QODAY    Continuous Infusions:   PRN Meds: acetaminophen, HYDROcodone-acetaminophen, HYDROmorphone (DILAUDID) injection, hydrOXYzine  Physical Exam    Constitutional: He appears ill.  elderly obese male    HENT:  Mouth/Throat: Oropharynx is clear and moist.  Cardiovascular: Normal rate, regular rhythm and normal heart sounds.   Pulmonary/Chest: He has decreased breath sounds in the right lower field and the left lower field.  Musculoskeletal:  - BLE with continued + 1 edema  Neurological: He is alert. He displays atrophy.  Skin: Skin is warm and dry.  Noted sacral wound, see EMR  Psychiatric: Cognition and memory are impaired.            Vital Signs: BP 138/67 (BP Location: Left Arm)   Pulse 71   Temp 98 F (36.7 C) (Oral)   Resp 17   Ht 5\' 10"  (1.778 m)   Wt 131.5 kg (290 lb)   SpO2 97%   BMI 41.61 kg/m  SpO2: SpO2: 97 % O2 Device: O2 Device: Not Delivered O2 Flow Rate: O2 Flow Rate (L/min): 2 L/min  Intake/output summary:   Intake/Output Summary (Last 24 hours) at 01/20/17 [id number] Last data filed at 01/20/17 0415  Gross per 24 hour  Intake             2380 ml  Output             1700 ml  Net              680 ml   LBM: Last BM Date: 01/19/17 Baseline Weight: Weight: 136.1 kg (300 lb) Most recent weight: Weight: 131.5 kg (290 lb)       Palliative Assessment/Data: 30 % at best    Flowsheet Rows   Flowsheet Row Most Recent Value  Intake Tab  Referral Department  Hospitalist  Unit at Time of Referral  Med/Surg Unit  Palliative Care Primary Diagnosis  Sepsis/Infectious Disease  Date Notified  01/06/17  Palliative Care Type  New Palliative care  Reason for referral  Clarify Goals of Care  Date of Admission  12/29/16  Date first seen by Palliative Care  01/08/17  # of days Palliative referral response time  2 Day(s)  # of days IP prior to Palliative referral  8  Clinical Assessment  Palliative Performance Scale Score  20%  Psychosocial & Spiritual Assessment  Palliative Care Outcomes      Patient Active Problem List   Diagnosis Date Noted  . Disorientation   . DNR (do not resuscitate)   . Palliative  care by specialist   . Decubitus ulcer of sacral region, unstageable (HCC) 12/30/2016  . Delirium 12/30/2016  . Cellulitis of lower extremity   . Hyponatremia 07/15/2015  . Cellulitis of right lower extremity 07/15/2015  . Acute on chronic renal failure (HCC)   . Hypokalemia   . Chronic atrial fibrillation (HCC) 07/09/2015  . Chronic anemia 07/09/2015  . CHF exacerbation (HCC) 07/08/2015  . Chronic diastolic CHF (congestive heart failure) (HCC) 04/02/2015  . Chronic diastolic heart failure (HCC) 01/25/2015  . Cellulitis 01/17/2015  . Pulmonary vascular congestion   . Congestive heart disease (HCC)   . Peripheral edema   . Acute exacerbation of CHF (congestive heart failure) (HCC) 01/04/2015  . 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 Renal artery stenosis (HCC) 02/17/2014  . Peripheral vascular disease (HCC) 04/15/2013  . Chronic venous insufficiency 04/15/2013  . Morbid obesity (HCC) 06/11/2012  . Acute on chronic diastolic CHF (congestive heart failure) (HCC) 06/02/2012  . Unspecified essential hypertension 05/18/2012  . Atrial fibrillation (HCC) 04/27/2012  . CHF (congestive heart failure) (HCC) 04/27/2012  . Hypertension 04/27/2012    Palliative Care Assessment & Plan   Patient Profile: 71 y.o. male   admitted on 12/29/2016 with a past  medical history significant of A.Fib, HLD, HTN, sacral decubitus ulcer.  Patient pres[facility] ED [facility] with c/o confusion and elevated WBC today on routine labs.  Work up in the  ED is significant for an unstageable sacral decubitus ulcer.  Continued to treat the treatable   Family is hopeful for return to baseline  Assessment:  Long term poor prognosis 2/2 to multiple co-morbid ites and now a stage IV decubitis  Recommendations/Plan:   Family desire continued treatment of wound, they are  open to wound care at [facility] for more aggressive would care if possible   Goals of Care and Additional Recommendations: Family is open to all offered and available medical interventions to prolong life Code Status:    Code Status Orders        Start     Ordered   01/08/17 1427  Do not attempt resuscitation (DNR)  Continuous    Question Answer Comment  In the event of cardiac or respiratory ARREST Do not call a "code blue"   In the event of cardiac or respiratory ARREST Do not perform Intubation, CPR, defibrillation or ACLS   In the event of cardiac or respiratory ARREST Use medication by any route, position, wound care, and other measures to relive pain and suffering. May use oxygen, suction and manual treatment of airway obstruction as needed for comfort.      01/08/17 1426    Code Status History    Date Active Date Inactive Code Status Order ID Comments User Context   12/30/2016  5:48 AM 01/08/2017  2:26 PM Full Code [id number]  Hillary Bow, DO ED   11/24/2016  1:24 PM 11/29/2016  3:07 PM Full Code [id number]  Gwenyth Bender, NP ED   11/13/2016 10:41 PM 11/17/2016  9:47 PM Full Code [id number]  Eduard Clos, MD Inpatient   07/09/2015 12:40 AM 07/18/2015  5:56 PM Full Code [id number]  Eduard Clos, MD Inpatient   01/04/2015 10:39 PM 01/12/2015  7:37 PM Full Code [id number]  Inez Catalina, MD Inpatient    Advance Directive Documentation   Flowsheet Row Most Recent Value  Type of Advance Directive  Healthcare Power of Attorney, Living will [daughter, [name]]  Pre-existing out of facility DNR order (yellow form or pink MOST form)  No data  "MOST" Form in Place?  No data       Prognosis:   Unable to determine, dependant on desire for life prolonging intervetnions  Discharge Planning:  To Be Determined  Care plan was discussed with bedside RN  Thank you for allowing the Palliative Medicine Team to assist in the care of this patient.   Time In: 0900 Time Out: 0925 Total  Time 25 min Prolonged Time Billed  no       Greater than 50%  of this time was spent counseling and coordinating care related to the above assessment and plan.  Lorinda Creed, NP  Please contact Palliative Medicine Team phone at [phone] for questions and concerns.

## 2017-01-20 NOTE — Progress Notes (Signed)
Patient ID: Jesus [name], male   DOB: [birth year], 71 y.o.   MRN: [id number]  [facility]  Surgery Progress Note  18 Days Post-Op  Subjective: No complaints today. Hydrotherapy restarted yesterday.   Objective: Vital signs in last 24 hours: Temp:  [98 F (36.7 C)-100.9 F (38.3 C)] 98 F (36.7 C) (02/13 0910) Pulse Rate:  [71-88] 71 (02/13 0910) Resp:  [15-20] 17 (02/13 0910) BP: (133-154)/(58-78) 138/67 (02/13 0910) SpO2:  [93 %-98 %] 97 % (02/13 0910) Weight:  [290 lb (131.5 kg)] 290 lb (131.5 kg) (02/12 2305) Last BM Date: 01/19/17  Intake/Output from previous day: 02/12 0701 - 02/13 0700 In: 2380 [P.O.:1420; Blood:760; IV Piggyback:200] Out: 1700 [Urine:1700] Intake/Output this shift: Total I/O In: 240 [P.O.:240] Out: 300 [Urine:300]  PE: Gen:  Alert, NAD, pleasant, cooperative Pulm:  Effort normal Abd: Soft, NT/ND, +BS, no HSM Buttock wound: soft yellow tissue at base of wound, wound edges are clean/less necrotic tissue today on edges and left/central aspects, blister noted proximal/right side     Lab Results:   Recent Labs  01/19/17 0524 01/20/17 0746  WBC 7.7 8.0  HGB 8.0* 8.9*  HCT 25.7* 29.3*  PLT 252 243   BMET  Recent Labs  01/19/17 0524 01/20/17 0746  NA 146* 142  K 3.8 3.2*  CL 100* 96*  CO2 35* 34*  GLUCOSE 109* 154*  BUN 38* 38*  CREATININE 1.34* 1.31*  CALCIUM 8.8* 8.5*   PT/INR No results for input(s): LABPROT, INR in the last 72 hours. CMP     Component Value Date/Time   NA 142 01/20/2017 0746   NA 133 (A) 12/04/2016   K 3.2 (L) 01/20/2017 0746   CL 96 (L) 01/20/2017 0746   CO2 34 (H) 01/20/2017 0746   GLUCOSE 154 (H) 01/20/2017 0746   BUN 38 (H) 01/20/2017 0746   BUN 38 (A) 12/04/2016   CREATININE 1.31 (H) 01/20/2017 0746   CALCIUM 8.5 (L) 01/20/2017 0746   PROT 6.3 (L) 01/01/2017 0518   ALBUMIN 1.9 (L) 01/01/2017 0518   AST 23 01/01/2017 0518   ALT 20 01/01/2017 0518   ALKPHOS 171 (H) 01/01/2017 0518   BILITOT 1.5 (H) 01/01/2017 0518   GFRNONAA 54 (L) 01/20/2017 0746   GFRAA >60 01/20/2017 0746   Lipase     Component Value Date/Time   LIPASE 27 12/29/2016 1916       Studies/Results: Dg Chest Port 1 View  Result Date: 01/20/2017 CLINICAL DATA:  Shortness of breath. Follow-up abnormal chest radiograph. EXAM: PORTABLE CHEST 1 VIEW COMPARISON:  01/19/2017.  12/29/2016. FINDINGS: Markedly enlarged cardiac silhouette again demonstrated. This could be due to cardiomegaly and/or pericardial fluid. Aortic atherosclerosis again demonstrated. There is pulmonary venous hypertension without frank edema. No consolidation or collapse. No visible effusions. Probably slightly improved since yesterday. IMPRESSION: Probably improved since yesterday. Cardiomegaly. Less venous hypertension. Electronically Signed   By: Paulina FusiMark  Shogry M.D.   On: 01/20/2017 09:20   Dg Chest Port 1 View  Result Date: 01/19/2017 CLINICAL DATA:  Fever. EXAM: PORTABLE CHEST 1 VIEW COMPARISON:  Radiographs of December 29, 2016. FINDINGS: Stable cardiomegaly. No pneumothorax or pleural effusion is noted. Increased central pulmonary vascular congestion and bilateral interstitial densities are noted concerning for pulmonary edema or possibly interstitial inflammation. Bony thorax is unremarkable. IMPRESSION: Increased central pulmonary vascular congestion is noted with increased bilateral interstitial densities concerning for pulmonary edema or possibly interstitial inflammation. Electronically Signed   By: Lupita RaiderJames  Green Jr, M.D.   On: 01/19/2017  10:20    Anti-infectives: Anti-infectives    Start     Dose/Rate Route Frequency Ordered Stop   01/19/17 1200  Ampicillin-Sulbactam (UNASYN) 3 g in sodium chloride 0.9 % 100 mL IVPB     3 g 200 mL/hr over 30 Minutes Intravenous Every 6 hours 01/19/17 1122     01/05/17 2000  vancomycin (VANCOCIN) IVPB 1000 mg/200 mL premix     1,000 mg 200 mL/hr over 60 Minutes Intravenous Every 24 hours  01/05/17 1027 01/11/17 2231   01/01/17 2000  vancomycin (VANCOCIN) IVPB 1000 mg/200 mL premix  Status:  Discontinued     1,000 mg 200 mL/hr over 60 Minutes Intravenous Every 12 hours 01/01/17 1314 01/05/17 1010   12/30/16 2200  vancomycin (VANCOCIN) 1,500 mg in sodium chloride 0.9 % 500 mL IVPB  Status:  Discontinued     1,500 mg 250 mL/hr over 120 Minutes Intravenous Every 24 hours 12/29/16 2029 01/01/17 1314   12/30/16 0600  Ampicillin-Sulbactam (UNASYN) 3 g in sodium chloride 0.9 % 100 mL IVPB  Status:  Discontinued     3 g 200 mL/hr over 30 Minutes Intravenous Every 8 hours 12/30/16 0518 12/30/16 0537   12/30/16 0600  piperacillin-tazobactam (ZOSYN) IVPB 3.375 g     3.375 g 12.5 mL/hr over 240 Minutes Intravenous Every 8 hours 12/30/16 0537 01/11/17 2359   12/29/16 2030  vancomycin (VANCOCIN) 2,000 mg in sodium chloride 0.9 % 500 mL IVPB     2,000 mg 250 mL/hr over 120 Minutes Intravenous  Once 12/29/16 2028 12/29/16 2306       Assessment/Plan Large infected sacral decub ulcer S/p debridement 1/26 Dr. Derrell Lolling - POD 18 - transitioned from vac to hydro and wet to dry dressing changes yesterday  Plan - wound improved with hydrotherapy and BID wet to dry dressing changes. Continue current therapies. Will reassess on Thursday to possibly transition back to vac.   LOS: 21 days    Jesus [name] , [facility] Surgery 01/20/2017, 11:07 AM Pager: [phone] Consults: [phone] Mon-Fri 7:00 am-4:30 pm Sat-Sun 7:00 am-11:30 am

## 2017-01-20 NOTE — Clinical Social Work Note (Addendum)
CSW continues to monitor patient's progress. Insurance has denied LTAC and the peer-to-peer request was also denied. Per [facility] representative, once patient medically stable another appeal will be filed with patient's insurance. Patient not medically stable at this time, and CSW will continue to follow patient's progress and facilitate d/c to a skilled facility if all appeal options are depleted for LTAC.  Genelle BalVanessa Riley Hallum, MSW, LCSW Licensed Clinical Social Worker Clinical Social Work Department [facility] [phone]

## 2017-01-20 NOTE — Progress Notes (Signed)
Paged Dr. Rito EhrlichKrishnan PT here to do hydrotherapy

## 2017-01-20 NOTE — Progress Notes (Signed)
[facility] PROGRESS NOTE  Jesus [name] VZC:[id number] DOB: 1946-07-12 DOA: 12/29/2016  PCP: [name]  Brief History/Interval Summary: Jesus Kingdom Prevetteis a 71 y.o.malewith medical history significant for A.Fib, HLD, HTN, sacral decubitus ulcer. Patient presented to the ED via EMS from [facility] with c/o confusion and elevated WBC on routine labs. Concern was for infection in the sacral decubitus. Patient was hospitalized for management. Patient was placed on broad-spectrum antibiotics. Anticoagulation was held. Patient underwent surgical debridement on 1/26.Patient also noted to have hepatic and pancreatic lesions noted incidentally on CT scan. Palliative care was consulted. Patient remains encephalopathic. Family elected to pursue treatment for now. He is now DO NOT RESUSCITATE. He again had some bleeding from around his wound. Anticoagulation was held. Patient developed fever on 2/11. Chest x-ray showed either fluid overload or infection. Patient started on Unasyn due to concern for aspiration. Also given Lasix.  Reason for Visit: Sacral wound infection.   Consultants: Gen. surgery. Palliative medicine.  Procedures: Debridement of the sacral wound  Antibiotics: Completed a course of vancomycin and Zosyn Unasyn started on 2/12  Subjective/Interval History: Patient remains confused and disoriented. Denies any difficulty breathing. Denies any cough.  ROS: Unable to do  Objective:  Vital Signs  Vitals:   01/19/17 1904 01/19/17 2305 01/20/17 0414 01/20/17 0910  BP: 136/69 (!) 133/58 (!) 144/68 138/67  Pulse: 88 76 76 71  Resp: 15 16 18 17   Temp: 100.1 F (37.8 C) 99.1 F (37.3 C) 98.3 F (36.8 C) 98 F (36.7 C)  TempSrc: Oral Oral Oral Oral  SpO2: 98% 95% 98% 97%  Weight:  131.5 kg (290 lb)    Height:        Intake/Output Summary (Last 24 hours) at 01/20/17 1142 Last data filed at 01/20/17 0932  Gross per 24 hour  Intake              2020 ml  Output             1700 ml  Net              320 ml   Filed Weights   01/18/17 0000 01/18/17 2104 01/19/17 2305  Weight: 126.1 kg (278 lb) 126.7 kg (279 lb 5.2 oz) 131.5 kg (290 lb)    General appearance: alert, distracted and no distress Resp: Coarse breath sounds bilaterally. No crackles heard. Few wheezes. No rhonchi. Cardio: regular rate and rhythm, S1, S2 normal, no murmur, click, rub or gallop GI: soft, non-tender; bowel sounds normal; no masses,  no organomegaly Extremities: venous stasis dermatitis. Both legs are covered with dressing. Pulses: 2+ and symmetric Neurologic: Remains distracted and confused. No focal deficits.  Wound VAC continues to show accumulation of red colored fluid.   Lab Results:  Data Reviewed: I have personally reviewed following labs and imaging studies  CBC:  Recent Labs Lab 01/16/17 1304 01/17/17 0510 01/18/17 0854 01/19/17 0524 01/20/17 0746  WBC 7.1 7.9 7.5 7.7 8.0  HGB 8.4* 8.6* 8.2* 8.0* 8.9*  HCT 26.9* 27.4* 26.6* 25.7* 29.3*  MCV 89.4 89.8 90.2 91.5 90.2  PLT 281 293 266 252 128    Basic Metabolic Panel:  Recent Labs Lab 01/16/17 0535 01/17/17 0510 01/18/17 0854 01/19/17 0524 01/20/17 0746  NA 141 145 145 146* 142  K 3.6 3.8 3.4* 3.8 3.2*  CL 96* 95* 100* 100* 96*  CO2 34* 35* 34* 35* 34*  GLUCOSE 119* 109* 156* 109* 154*  BUN 34* 33* 35*  38* 38*  CREATININE 1.29* 1.20 1.27* 1.34* 1.31*  CALCIUM 9.1 9.2 9.0 8.8* 8.5*    GFR: Estimated Creatinine Clearance: 71.5 mL/min (by C-G formula based on SCr of 1.31 mg/dL (H)).   Radiology Studies: Dg Chest Port 1 View  Result Date: 01/20/2017 CLINICAL DATA:  Shortness of breath. Follow-up abnormal chest radiograph. EXAM: PORTABLE CHEST 1 VIEW COMPARISON:  01/19/2017.  12/29/2016. FINDINGS: Markedly enlarged cardiac silhouette again demonstrated. This could be due to cardiomegaly and/or pericardial fluid. Aortic atherosclerosis again demonstrated. There is  pulmonary venous hypertension without frank edema. No consolidation or collapse. No visible effusions. Probably slightly improved since yesterday. IMPRESSION: Probably improved since yesterday. Cardiomegaly. Less venous hypertension. Electronically Signed   By: Nelson Chimes M.D.   On: 01/20/2017 09:20   Dg Chest Port 1 View  Result Date: 01/19/2017 CLINICAL DATA:  Fever. EXAM: PORTABLE CHEST 1 VIEW COMPARISON:  Radiographs of December 29, 2016. FINDINGS: Stable cardiomegaly. No pneumothorax or pleural effusion is noted. Increased central pulmonary vascular congestion and bilateral interstitial densities are noted concerning for pulmonary edema or possibly interstitial inflammation. Bony thorax is unremarkable. IMPRESSION: Increased central pulmonary vascular congestion is noted with increased bilateral interstitial densities concerning for pulmonary edema or possibly interstitial inflammation. Electronically Signed   By: Marijo Conception, M.D.   On: 01/19/2017 10:20     Medications:  Scheduled: . sodium chloride   Intravenous Once  . ampicillin-sulbactam (UNASYN) IV  3 g Intravenous Q6H  . atorvastatin  80 mg Oral q1800  . carvedilol  6.25 mg Oral BID WC  . collagenase   Topical Daily  . divalproex  500 mg Oral QHS  . feeding supplement (ENSURE ENLIVE)  237 mL Oral TID BM  . feeding supplement (PRO-STAT SUGAR FREE 64)  30 mL Oral QID  . furosemide  40 mg Intravenous Once  . LORazepam  1 mg Intravenous Once  . mouth rinse  15 mL Mouth Rinse BID  . pantoprazole sodium  40 mg Oral BID  . QUEtiapine  12.5 mg Oral BID  . saccharomyces boulardii  250 mg Oral BID  . thiamine  100 mg Oral Daily  . torsemide  20 mg Oral QODAY   Continuous:  WGN:FAOZHYQMVHQIO, HYDROcodone-acetaminophen, HYDROmorphone (DILAUDID) injection, hydrOXYzine  Assessment/Plan:  Principal Problem:   Decubitus ulcer of sacral region, unstageable ([place]) Active Problems:   Atrial fibrillation (HCC)   Chronic venous  insufficiency   Chronic diastolic CHF (congestive heart failure) (HCC)   Acute on chronic renal failure (HCC)   Delirium   DNR (do not resuscitate)   Palliative care by specialist   Disorientation    Infected Decubitus ulcer of sacral region -Patient was found to have large sacral decubitus ulcer, unstageable with concerns for infection.  -CT scan did show some air pockets without any evidence for abscess. Started on vancomycin and Zosyn. -Wound care was consulted. Subsequently, general surgery was consulted. Patient's anticoagulation was held.  -Patient underwent surgical debridement 1/26. Patient was placed on vancomycin and Zosyn. He completed a course of these antibiotics. -Patient received hydrotherapy. Now has a wound vac. -Needs to improve his nutritional status to heal the wounds. -Palliative medicine met with the family, DNR for now but they want to continue to treat and see how he does. -Patient subsequently started bleeding around the wound area. Required blood transfusion. Anticoagulation has been stopped for now. Bleeding appears to have slowed down some.  Fever with abnormal chest x-ray  Noted to have a fever on the  night of 2/11. Chest x-ray showed bilateral infiltrates. He was febrile. There was concern for aspiration. Patient was started on Unasyn. Patient was also given intravenous diuretic. Chest x-ray repeated today and shows improvement. This is more in line with pulmonary edema rather than infection. Patient seems to be asymptomatic. He'll be given an additional dose of Lasix today. Patient is on potassium, which will be continued. BNP was noted to be elevated. Continue Unasyn for now. Could be potentially changed to Augmentin tomorrow.  Acute encephalopathy in the setting of questionable dementia Some of this is due to baseline cognitive impairment. According to patient's son, patient has had issues with memory for a few months. Patient was noted to be on Aricept a few  months ago. Currently on Depakote and Seroquel. Mental status has been stable. Workup done here included B-12 level which was normal. TSH normal. RPR and HIV are nonreactive. EEG did not show any epileptiform activity. MRI brain was limited due to motion, however, did not show any acute findings. Atrophy was present. Continue thiamine considering history of alcohol use in the past. Depakote level was nontoxic. Patient's verbalization of wanting to hurt himself, was most likely secondary to pain. Seen by psychiatry and cleared. Per family his confusion has been going on for some time but recently worsened, likely worsening dementia.  A.Fib  -CHA2DS2-VASc score is 4. Rate is controlled with Coreg. Patient was on Eliquis, which was held for surgery. - It was resumed. However, patient noted to have a lot of bleeding from around his wound. His hemoglobin dropped and he required blood transfusion. His anticoagulation has been discontinued for now. Family informed about the risk of stroke.  Acute blood loss anemia -He was transfused 2 units of blood on 2/8. Hemoglobin responded only partially. Note of blood noted in the wound VAC. Surgical PA examined the wound and found a lot of bleeding from surrounding desquamated area. Anticoagulation was contributing to this. This was stopped. Patient transfused one more unit of blood on 2/12.   Likely mild AKI on CKD stage 3/hypernatremia/hypokalemia -Creatinine baseline 1.4, presented with creatinine 1.6 very close to baseline. Patient had good response to IV Lasix yesterday. Creatinine improved this morning. Sodium level is normal. Potassium to be repleted.  Acute on Chronic diastolic CHF  Patient's chest x-ray from 2/showed bilateral infiltrates. Concern was for infection versus pulmonary edema. Patient appears to have responded to Lasix. X-ray from today shows improvement. So, this is more in line with pulmonary edema. He'll be given an additional dose of IV  Lasix today. He is already on potassium which will be repleted. He was already restarted on torsemide. The dose of this medication could be increased depending on his clinical status tomorrow. Patient was on spironolactone at home, which was held at admission due to renal insufficiency.  Hepatic and pancreatic lesions -Cystic lesions incidentally noted on CT scan.  -MRI done and showed possible liver cirrhosis, pancreatic lesion does not look malignant, likely benign versus pseudocyst. Considering his overall poor health and possible dementia, he does not merit further evaluation at this time. Due to poor quality of MRI obtained during this hospitalization, a repeat MRI could be considered in 6 months if patient is able to better cooperate at that time.  Chronic venous insufficiency  Stable. Wound care. Per nursing staff, patient's oral intake has been good.  Goals of care Patient also noted to have generalized deconditioning. His quality of life appears to be poor. His activity level and functional status  is not known, but likely poor considering presence of decubitus ulcer. Palliative medicine has seen the patient and discussed with family. Now DO NOT RESUSCITATE.She was not progressing well. Now with new fever. Palliative medicine to reevaluate patient today.  DVT Prophylaxis: Apixaban was stopped due to bleeding. He has bilateral venous stasis with dressing. Cannot use SCDs. Code Status: DO NOT RESUSCITATE  Family Communication: No family at bedside. Discussed with the son in detail 2/10. He was informed about denial from insurance for patient to go to [place]. He was also told about bleeding issues and need to stop anticoagulation for now. Tried calling him today with no response yet. Disposition Plan: Await acute issues to resolve. Insurance continues to deny admission to [place]. He may have to go to SNF when active issues resolve.   LOS: 21 days   [facility] Pager  [phone] 01/20/2017, 11:42 AM  If 7PM-7AM, please contact night-coverage at [email or web address], password [facility]

## 2017-01-20 NOTE — Progress Notes (Signed)
Physical Therapy Wound Treatment Patient Details  Name: Jesus [name] MRN: [id number] Date of Birth: 07-21-1946  Today's Date: 01/20/2017 Time: 8016-5537 Time Calculation (min): 42 min  Subjective  Subjective: Pt appears confused at times, however remains generally agreeable to hydrotherapy.  Patient and Family Stated Goals: Pt did not state goals during session Date of Onset:  (chronic) Prior Treatments: I&D 01/02/17, wound vac  Pain Score: moderated pain with positioning, minimal pain with hydrotherapy.  IV pain meds pre tx.   Wound Assessment  Pressure Injury 12/29/16 Unstageable - Full thickness tissue loss in which the base of the ulcer is covered by slough (yellow, tan, gray, green or brown) and/or eschar (tan, brown or black) in the wound bed. (Active)  Dressing Type ABD;Barrier Film (skin prep);Gauze (Comment);Moist to dry 01/20/2017  2:01 PM  Dressing Clean;Dry;Intact 01/20/2017  2:01 PM  Dressing Change Frequency Daily 01/20/2017  2:01 PM  State of Healing Early/partial granulation 01/20/2017  2:01 PM  Site / Wound Assessment Red;Yellow;Black 01/20/2017  2:01 PM  % Wound base Red or Granulating 60% 01/20/2017  2:01 PM  % Wound base Yellow/Fibrinous Exudate 30% 01/20/2017  2:01 PM  % Wound base Black/Eschar 10% 01/20/2017  2:01 PM  % Wound base Other/Granulation Tissue (Comment) 0% 01/20/2017  2:01 PM  Peri-wound Assessment Erythema (blanchable) 01/20/2017  2:01 PM  Wound Length (cm) 15.2 cm 01/19/2017  2:59 PM  Wound Width (cm) 17.4 cm 01/19/2017  2:59 PM  Wound Depth (cm) 6.3 cm 01/19/2017  2:59 PM  Undermining (cm) 5 cm at 10:00, 3.2 cm at 4:00, 5.2 cm at 9:00, 3.6 cm at 7:00. 01/19/2017  2:59 PM  Margins Unattached edges (unapproximated) 01/20/2017  2:01 PM  Drainage Amount Moderate 01/20/2017  2:01 PM  Drainage Description Serous;Serosanguineous 01/20/2017  2:01 PM  Treatment Debridement (Selective);Hydrotherapy (Pulse lavage);Packing (Saline gauze) 01/20/2017  2:01 PM      Santyl  applied to wound bed prior to applying dressing.  Hydrotherapy Pulsed lavage therapy - wound location: Sacrum Pulsed Lavage with Suction (psi): 12 psi Pulsed Lavage with Suction - Normal Saline Used: 1000 mL Pulsed Lavage Tip: Tip with splash shield Selective Debridement Selective Debridement - Location: Sacrum Selective Debridement - Tools Used: Forceps;Scalpel Selective Debridement - Tissue Removed: Yellow and black necrotic tissue   Wound Assessment and Plan  Wound Therapy - Assess/Plan/Recommendations Wound Therapy - Clinical Statement: Day 2 of hydrotherapy post d/c of wound vac.  Pt appears more relaxed during treatment.  On arrival incontinent of stool.  Pt remains to benefit from skilled hydro therapy to removal necrotic tissue and decreased bioburden.  Surgery reports to assess daily in efforts to determine when patient can be placed back on the wound vac.   Wound Therapy - Functional Problem List: Decreased tolerance for OOB, bed mobility; pain Factors Delaying/Impairing Wound Healing: Altered sensation;Immobility;Multiple medical problems;Vascular compromise Hydrotherapy Plan: Debridement;Dressing change;Patient/family education;Pulsatile lavage with suction Wound Therapy - Frequency: 6X / week Wound Therapy - Follow Up Recommendations: Skilled nursing facility Wound Plan: See above  Wound Therapy Goals- Improve the function of patient's integumentary system by progressing the wound(s) through the phases of wound healing (inflammation - proliferation - remodeling) by: Decrease Necrotic Tissue to: 25% Decrease Necrotic Tissue - Progress: Progressing toward goal Increase Granulation Tissue to: 75% Increase Granulation Tissue - Progress: Progressing toward goal  Goals will be updated until maximal potential achieved or discharge criteria met.  Discharge criteria: when goals achieved, discharge from hospital, MD decision/surgical intervention, no progress towards goals,  refusal/missing three consecutive treatments without notification or medical reason.  GP     Sarah Zerby Eli Hose 01/20/2017, 2:12 PM Governor Rooks, PTA pager [phone]

## 2017-01-21 DIAGNOSIS — R938 Abnormal findings on diagnostic imaging of other specified body structures: Secondary | ICD-10-CM

## 2017-01-21 DIAGNOSIS — L89150 Pressure ulcer of sacral region, unstageable: Principal | ICD-10-CM

## 2017-01-21 DIAGNOSIS — G9340 Encephalopathy, unspecified: Secondary | ICD-10-CM

## 2017-01-21 LAB — BASIC METABOLIC PANEL
ANION GAP: 11 (ref 5–15)
BUN: 30 mg/dL — ABNORMAL HIGH (ref 6–20)
CO2: 34 mmol/L — ABNORMAL HIGH (ref 22–32)
Calcium: 8.4 mg/dL — ABNORMAL LOW (ref 8.9–10.3)
Chloride: 94 mmol/L — ABNORMAL LOW (ref 101–111)
Creatinine, Ser: 1.19 mg/dL (ref 0.61–1.24)
GFR calc Af Amer: >60 mL/min (ref >60)
GLUCOSE: 115 mg/dL — AB (ref 65–99)
POTASSIUM: 3.7 mmol/L (ref 3.5–5.1)
Sodium: 139 mmol/L (ref 135–145)

## 2017-01-21 LAB — CBC
HEMATOCRIT: 28.7 % — AB (ref 39.0–52.0)
HEMOGLOBIN: 9 g/dL — AB (ref 13.0–17.0)
MCH: 28 pg (ref 26.0–34.0)
MCHC: 31.4 g/dL (ref 30.0–36.0)
MCV: 89.1 fL (ref 78.0–100.0)
PLATELETS: 227 10*3/uL (ref 150–400)
RBC: 3.22 MIL/uL — AB (ref 4.22–5.81)
RDW: 18.3 % — ABNORMAL HIGH (ref 11.5–15.5)
WBC: 6.4 10*3/uL (ref 4.0–10.5)

## 2017-01-21 MED ORDER — AMOXICILLIN-POT CLAVULANATE 875-125 MG PO TABS
1.0000 | ORAL_TABLET | Freq: Two times a day (BID) | ORAL | Status: DC
  Administered 2017-01-21 – 2017-01-23 (×6): 1 via ORAL
  Filled 2017-01-21 (×6): qty 1

## 2017-01-21 NOTE — Progress Notes (Signed)
Nutrition Follow-up  DOCUMENTATION CODES:   Morbid obesity  INTERVENTION:  Continue Ensure Enlive po TID, each supplement provides 350 kcal and 20 grams of protein.  Continue 30 ml Prostat po QID, each supplement provides 100 kcal and 15 grams of protein.   Encourage adequate PO intake.   NUTRITION DIAGNOSIS:   Increased nutrient needs related to wound healing as evidenced by estimated needs; ongoing  GOAL:   Patient will meet greater than or equal to 90% of their needs; progressing  MONITOR:   PO intake, Supplement acceptance, Labs, Weight trends, Skin, I & O's  REASON FOR ASSESSMENT:   Low Braden    ASSESSMENT:   71 y.o. male with medical history significant of A.Fib, HLD, HTN, sacral decubitus ulcer. [facility] with c/o confusion and elevated WBC today on routine labs. Pt is confused at baseline according to the nursing staff. He does say his bottom hurts and he thinks he has had a wound for 4-5 months. PMH CKD3, CHF. Pt also presents with AKI on CKD3.  Meal completion has been varied from 15-100% with 20% at lunch today. Pt was unavailable, busy with RN during attempted time of visit. Pt currently has Ensure ordered and Prostat ordered. RD to continue with current orders to aid in wound healing. Noted supplement consumption has been mostly good. Continue to encourage intake.   Hydrotherapy restarted 2/12.   Labs and medications reviewed.   Diet Order:  DIET - DYS 1 Room service appropriate? Yes; Fluid consistency: Thin  Skin:  Wound (see comment) (Unstageable to sacrum with VAC, stg 2 to heels)  Last BM:  2/12  Height:   Ht Readings from Last 1 Encounters:  12/29/16 5\' 10"  (1.778 m)    Weight:   Wt Readings from Last 1 Encounters:  01/20/17 292 lb 1.6 oz (132.5 kg)    Ideal Body Weight:  75.45 kg  BMI:  Body mass index is 41.91 kg/m.  Estimated Nutritional Needs:   Kcal:  2000-2300  Protein:   120-140 grams  Fluid:  2-2.3 L/day  EDUCATION NEEDS:   No education needs identified at this time  Roslyn SmilingStephanie Zykee Avakian, MS, RD, LDN Pager # [phone] After hours/ weekend pager # [phone]

## 2017-01-21 NOTE — Progress Notes (Signed)
Physical Therapy Treatment Patient Details Name: Jesus [name] Prom MRN: [id number] DOB: 11/04/1946 Today's Date: 01/21/2017    History of Present Illness  Jesus [name] Kosel is a 71 y.o. male with medical history significant of A.Fib, HLD, HTN, sacral decubitus ulcer.  Patient presents to the ED via EMS [facility] with c/o confusion and elevated WBC today on routine labs.      PT Comments    Pt progressing slowly regarding mobility. At this time the pt is continuing to require +2 max assistance with bed mobility. Unable to safety attempt standing. PT continuing to recommend further rehabilitation following acute stay. Will continue to follow to progress as tolerated.   Follow Up Recommendations  SNF;LTACH     Equipment Recommendations   (to be determined)    Recommendations for Other Services       Precautions / Restrictions Precautions Precautions: Fall Restrictions Weight Bearing Restrictions: No    Mobility  Bed Mobility Overal bed mobility: Needs Assistance Bed Mobility: Rolling;Supine to Sit;Sit to Supine Rolling: +2 for physical assistance;Max assist Sidelying to sit: +2 for physical assistance;Max assist   Sit to supine: +2 for physical assistance;Total assist   General bed mobility comments: Pt attempting to help with UEs inconsistently  Transfers                 General transfer comment: unsafe to attempt  Ambulation/Gait                 Stairs            Wheelchair Mobility    Modified Rankin (Stroke Patients Only)       Balance Overall balance assessment: Needs assistance Sitting-balance support: Bilateral upper extremity supported Sitting balance-Leahy Scale: Poor Sitting balance - Comments: Pt able to sit for periods of time holding therapist's hand. Initially leaning posterior and right but improving over time.  Postural control: Posterior lean;Right lateral lean                           Cognition Arousal/Alertness: Awake/alert Behavior During Therapy: Anxious Overall Cognitive Status: History of cognitive impairments - at baseline                 General Comments: Pt inconsistent with ability to follow commands, stopping session for random thoughts or asking what is happening.     Exercises      General Comments        Pertinent Vitals/Pain Pain Assessment: Faces Faces Pain Scale: Hurts little more (variable throughout session) Pain Location: legs Pain Descriptors / Indicators: Grimacing;Moaning Pain Intervention(s): Limited activity within patient's tolerance;Monitored during session    Home Living                      Prior Function            PT Goals (current goals can now be found in the care plan section) Acute Rehab PT Goals Patient Stated Goal: not expressed PT Goal Formulation: With patient Time For Goal Achievement: 02/04/17 Potential to Achieve Goals: Fair Progress towards PT goals: Progressing toward goals    Frequency    Min 3X/week      PT Plan Current plan remains appropriate    Co-evaluation             End of Session   Activity Tolerance: Patient limited by fatigue Patient left: in bed;with call bell/phone within reach  Time: 0981-1914 PT Time Calculation (min) (ACUTE ONLY): 31 min  Charges:  $Therapeutic Activity: 23-37 mins                    G Codes:      Christiane Ha, PT, CSCS Pager [phone] Office [phone]  01/21/2017, 12:22 PM

## 2017-01-21 NOTE — Care Management Important Message (Signed)
Important Message  Patient Details  Name: Jesus [name] MRN: [id number] Date of Birth: January 30, 1946   Medicare Important Message Given:  Yes    Susen Haskew, Annamarie Majorheryl U, RN 01/21/2017, 11:07 AM

## 2017-01-21 NOTE — Consult Note (Signed)
[place] Nurse wound follow up Wound type:   Lower extremity venous stasis dermatitis with pressure injuries bilateral heels and pretibial deep tissue injuries Measurement: FFM:BWGYK heel: 5 cm x 10 cm x 0.1; 75% thin black eschar; 25% pink surrounding tissue that is moist. Stage 2: Left heel: now open 4cm x 3cm x 0.2cm; moist pink center, with some darkening of the surrounding skin; minimal serosanguinous drainage  DTI right pretibial: 4cm x 0.5cm x 0; remains intact purple skin, no drainage DTI right and left dorsal feet has resolved DTI left pretibial resolved Left lateral calf; new area that is weeping/venous stasis ulceration: 3cm x 2cm x 0.1cm; 50% yellow/50% pink Periwound: severe venous dermatitis bilateral LEs Dressing procedure/placement/frequency: Removed all compression layers and foam dressings bilaterally Cleansed both legs well and allow to air dry Covered heels with protective 5 layer silicone foam Covered right pretibial area with 5 layer silicone foam Covered left lateral calf with 5 layer silicone foam Padded entire pretibial region bilaterally with soft cotton padding layer (Layer number 1) from Profore dressing kit.  Wrapped both legs with 4 layer compression wraps and replaced Prevalon boots.  [place] Nurse team will follow along with you for weekly wound assessments. Will follow up with PT during hydrotherapy session on Friday to replace VAC dressing.    Please notify me of any acute changes in the wounds or any new areas of concerns [place] MSN, [place], CNS [phone]

## 2017-01-21 NOTE — Progress Notes (Signed)
Physical Therapy Wound Treatment Patient Details  Name: Jesus [name] MRN: [id number] Date of Birth: 21-Jul-1946  Today's Date: 01/21/2017 Time: 6433-2951 Time Calculation (min): 35 min  Subjective  Subjective: Pt agreeable to hydro therapy today.  Pt appears in less pain during treatment.   Patient and Family Stated Goals: Pt did not state goals during session Prior Treatments: I&D 01/02/17, wound vac  Pain Score: Pain Score: 3   Wound Assessment  Pressure Injury 12/29/16 Unstageable - Full thickness tissue loss in which the base of the ulcer is covered by slough (yellow, tan, gray, green or brown) and/or eschar (tan, brown or black) in the wound bed. (Active)  Dressing Type ABD;Barrier Film (skin prep);Gauze (Comment);Moist to dry 01/21/2017  1:44 PM  Dressing Clean;Dry;Intact 01/21/2017  1:44 PM  Dressing Change Frequency Daily 01/21/2017  1:44 PM  State of Healing Early/partial granulation 01/21/2017  1:44 PM  Site / Wound Assessment Bleeding;Red;Yellow 01/21/2017  1:44 PM  % Wound base Red or Granulating 60% 01/21/2017  1:44 PM  % Wound base Yellow/Fibrinous Exudate 30% 01/21/2017  1:44 PM  % Wound base Black/Eschar 10% 01/21/2017  1:44 PM  % Wound base Other/Granulation Tissue (Comment) 0% 01/21/2017  1:44 PM  Peri-wound Assessment Erythema (blanchable) 01/21/2017  1:44 PM  Wound Length (cm) 15.2 cm 01/19/2017  2:59 PM  Wound Width (cm) 17.4 cm 01/19/2017  2:59 PM  Wound Depth (cm) 6.3 cm 01/19/2017  2:59 PM  Undermining (cm) 5 cm at 10:00, 3.2 cm at 4:00, 5.2 cm at 9:00, 3.6 cm at 7:00. 01/19/2017  2:59 PM  Margins Unattached edges (unapproximated) 01/21/2017  1:44 PM  Drainage Amount Moderate 01/21/2017  1:44 PM  Drainage Description Serous;Serosanguineous;Green;Purulent 01/21/2017  1:44 PM  Treatment Debridement (Selective);Hydrotherapy (Pulse lavage);Packing (Saline gauze) 01/20/2017  2:01 PM         Santyl applied to wound bed prior to applying dressing.  Hydrotherapy Pulsed lavage  therapy - wound location: Sacrum Pulsed Lavage with Suction (psi): 12 psi Pulsed Lavage with Suction - Normal Saline Used: 1000 mL Pulsed Lavage Tip: Tip with splash shield Selective Debridement Selective Debridement - Location: Sacrum Selective Debridement - Tools Used: Forceps;Scalpel Selective Debridement - Tissue Removed: Yellow and black necrotic tissue   Wound Assessment and Plan  Wound Therapy - Assess/Plan/Recommendations Wound Therapy - Clinical Statement: Day 3 of hydrotherapy post d/c of wound vac.  Pt remains more relaxed during treatment.  During tx incontinent of stool.  Pt remains to benefit from skilled hydro therapy to removal necrotic tissue and decreased bioburden.  Surgery reports to assess daily in efforts to determine when patient can be placed back on the wound vac.  Pt presents with new green color to packing when removed this am.   Wound Therapy - Functional Problem List: Decreased tolerance for OOB, bed mobility; pain Factors Delaying/Impairing Wound Healing: Altered sensation;Immobility;Multiple medical problems;Vascular compromise Hydrotherapy Plan: Debridement;Dressing change;Patient/family education;Pulsatile lavage with suction Wound Therapy - Frequency: 6X / week Wound Therapy - Follow Up Recommendations: Skilled nursing facility Wound Plan: See above  Wound Therapy Goals- Improve the function of patient's integumentary system by progressing the wound(s) through the phases of wound healing (inflammation - proliferation - remodeling) by: Decrease Necrotic Tissue to: 25% Decrease Necrotic Tissue - Progress: Progressing toward goal Increase Granulation Tissue to: 75% Increase Granulation Tissue - Progress: Progressing toward goal  Goals will be updated until maximal potential achieved or discharge criteria met.  Discharge criteria: when goals achieved, discharge from hospital, MD decision/surgical intervention, no  progress towards goals, refusal/missing three  consecutive treatments without notification or medical reason.  GP     Richardo Popoff Eli Hose 01/21/2017, 1:56 PM Governor Rooks, PTA pager [phone]

## 2017-01-21 NOTE — Progress Notes (Signed)
[facility] PROGRESS NOTE  Declyn Delsol Lesure JWJ:[id number] DOB: 08-04-1946 DOA: 12/29/2016  PCP: [name]  Brief History/Interval Summary: Jesus Kingdom Prevetteis a 71 y.o.malewith medical history significant for A.Fib, HLD, HTN, sacral decubitus ulcer. Patient presented to the ED via EMS from [facility] with c/o confusion and elevated WBC on routine labs. Concern was for infection in the sacral decubitus. Patient was hospitalized for management. Patient was placed on broad-spectrum antibiotics. Anticoagulation was held. Patient underwent surgical debridement on 1/26.Patient also noted to have hepatic and pancreatic lesions noted incidentally on CT scan. Palliative care was consulted. Patient remains encephalopathic. Family elected to pursue treatment for now. He is now DO NOT RESUSCITATE. He again had some bleeding from around his wound. Anticoagulation was held. Patient developed fever on 2/11. Chest x-ray showed either fluid overload or infection. Patient started on Unasyn due to concern for aspiration. Also given Lasix.  Reason for Visit: Sacral wound infection.   Consultants: Gen. surgery. Palliative medicine.  Procedures: Debridement of the sacral wound  Antibiotics: Completed a course of vancomycin and Zosyn Unasyn started on 2/12  Subjective/Interval History: Patient remains confused and disoriented. Denies any difficulty breathing. Denies any cough.  ROS: Unable to do  Objective:  Vital Signs  Vitals:   01/20/17 0910 01/20/17 1657 01/20/17 2210 01/21/17 0512  BP: 138/67 (!) 114/52 (!) 133/99 (!) 155/73  Pulse: 71 75 67 63  Resp: _0 Temp: 98 F (36.7 C) 97.8 F (36.6 C) 97.6 F (36.4 C) 97.7 F (36.5 C)  TempSrc: Oral Oral Oral Oral  SpO2: 97% 94% 96% 97%  Weight:   132.5 kg (292 lb 1.6 oz)   Height:        Intake/Output Summary (Last 24 hours) at 01/21/17 0911 Last data filed at 01/21/17 0548  Gross per 24 hour    Intake             1060 ml  Output             2075 ml  Net            -1015 ml   Filed Weights   01/18/17 2104 01/19/17 2305 01/20/17 2210  Weight: 126.7 kg (279 lb 5.2 oz) 131.5 kg (290 lb) 132.5 kg (292 lb 1.6 oz)    General appearance: alert, distracted and no distress Resp: Coarse breath sounds bilaterally. No crackles heard. Few wheezes. No rhonchi. Cardio: regular rate and rhythm, S1, S2 normal, no murmur, click, rub or gallop GI: soft, non-tender; bowel sounds normal; no masses,  no organomegaly Extremities: venous stasis dermatitis. Both legs are covered with dressing. Pulses: 2+ and symmetric Neurologic: Remains distracted and confused. No focal deficits.  Wound VAC continues to show accumulation of red colored fluid.   Lab Results:  Data Reviewed: I have personally reviewed following labs and imaging studies  CBC:  Recent Labs Lab 01/17/17 0510 01/18/17 0854 01/19/17 0524 01/20/17 0746 01/21/17 0520  WBC 7.9 7.5 7.7 8.0 6.4  HGB 8.6* 8.2* 8.0* 8.9* 9.0*  HCT 27.4* 26.6* 25.7* 29.3* 28.7*  MCV 89.8 90.2 91.5 90.2 89.1  PLT 293 266 252 243 621    Basic Metabolic Panel:  Recent Labs Lab 01/17/17 0510 01/18/17 0854 01/19/17 0524 01/20/17 0746 01/21/17 0520  NA 145 145 146* 142 139  K 3.8 3.4* 3.8 3.2* 3.7  CL 95* 100* 100* 96* 94*  CO2 35* 34* 35* 34* 34*  GLUCOSE 109* 156* 109* 154* 115*  BUN 33* 35* 38* 38* 30*  CREATININE 1.20 1.27* 1.34* 1.31* 1.19  CALCIUM 9.2 9.0 8.8* 8.5* 8.4*    GFR: Estimated Creatinine Clearance: 79.1 mL/min (by C-G formula based on SCr of 1.19 mg/dL).   Radiology Studies: Dg Chest Port 1 View  Result Date: 01/20/2017 CLINICAL DATA:  Shortness of breath. Follow-up abnormal chest radiograph. EXAM: PORTABLE CHEST 1 VIEW COMPARISON:  01/19/2017.  12/29/2016. FINDINGS: Markedly enlarged cardiac silhouette again demonstrated. This could be due to cardiomegaly and/or pericardial fluid. Aortic atherosclerosis again  demonstrated. There is pulmonary venous hypertension without frank edema. No consolidation or collapse. No visible effusions. Probably slightly improved since yesterday. IMPRESSION: Probably improved since yesterday. Cardiomegaly. Less venous hypertension. Electronically Signed   By: Nelson Chimes M.D.   On: 01/20/2017 09:20   Dg Chest Port 1 View  Result Date: 01/19/2017 CLINICAL DATA:  Fever. EXAM: PORTABLE CHEST 1 VIEW COMPARISON:  Radiographs of December 29, 2016. FINDINGS: Stable cardiomegaly. No pneumothorax or pleural effusion is noted. Increased central pulmonary vascular congestion and bilateral interstitial densities are noted concerning for pulmonary edema or possibly interstitial inflammation. Bony thorax is unremarkable. IMPRESSION: Increased central pulmonary vascular congestion is noted with increased bilateral interstitial densities concerning for pulmonary edema or possibly interstitial inflammation. Electronically Signed   By: Marijo Conception, M.D.   On: 01/19/2017 10:20     Medications:  Scheduled: . sodium chloride   Intravenous Once  . ampicillin-sulbactam (UNASYN) IV  3 g Intravenous Q6H  . atorvastatin  80 mg Oral q1800  . carvedilol  6.25 mg Oral BID WC  . collagenase   Topical Daily  . divalproex  500 mg Oral QHS  . feeding supplement (ENSURE ENLIVE)  237 mL Oral TID BM  . feeding supplement (PRO-STAT SUGAR FREE 64)  30 mL Oral QID  . mouth rinse  15 mL Mouth Rinse BID  . pantoprazole  40 mg Oral BID AC  . potassium chloride  40 mEq Oral BID  . QUEtiapine  12.5 mg Oral BID  . saccharomyces boulardii  250 mg Oral BID  . thiamine  100 mg Oral Daily  . torsemide  20 mg Oral QODAY   Continuous:  JOA:[id number], HYDROcodone-acetaminophen, HYDROmorphone (DILAUDID) injection, hydrOXYzine  Assessment/Plan:  Principal Problem:   Decubitus ulcer of sacral region, unstageable ([place]) Active Problems:   Atrial fibrillation (HCC)   Chronic venous insufficiency    Chronic diastolic CHF (congestive heart failure) (HCC)   Acute on chronic renal failure (HCC)   Delirium   DNR (do not resuscitate)   Palliative care by specialist   Disorientation   Abnormal CXR    Infected Decubitus ulcer of sacral region -Patient was found to have large sacral decubitus ulcer, unstageable with concerns for infection.  -CT scan did show some air pockets without any evidence for abscess. Started on vancomycin and Zosyn. Wound care was consulted. Subsequently, general surgery was consulted. Patient's anticoagulation was held.  S/p debridement1/26 Dr. Rosendo Gros. Patient was placed on vancomycin and Zosyn. He completed a course of these antibiotics. -Patient received hydrotherapy. Now has a wound vac. -Needs to improve his nutritional status to heal the wounds. -Palliative medicine met with the family, DNR for now but they want to continue to treat and see how he does. -Patient subsequently started bleeding around the wound area. Required blood transfusion. Anticoagulation has been stopped for now. Bleeding appears to have slowed down some. wound improved with hydrotherapy and BID wet to dry dressing changes    Fever  with abnormal chest x-ray  Noted to have a fever on the night of 2/11. Chest x-ray showed bilateral infiltrates. He was febrile. There was concern for aspiration. Patient was started on Unasyn. Chest x-ray repeated today and shows improvement. This is more in line with pulmonary edema rather than infection. Patient seems to be asymptomatic. He'll be given an additional dose of Lasix today. Patient is on potassium, which will be continued. BNP was noted to be elevated. Continue Unasyn for now. We'll change to Augmentin     Acute encephalopathy in the setting of questionable dementia, completely resolved Some of this is due to baseline cognitive impairment. According to patient's son, patient has had issues with memory for a few months. Patient was noted to be on  Aricept a few months ago. Currently on Depakote and Seroquel. Mental status has been stable. Workup done here included B-12 level which was normal. TSH normal. RPR and HIV are nonreactive. EEG did not show any epileptiform activity. MRI brain was limited due to motion, however, did not show any acute findings. Atrophy was present. Continue thiamine considering history of alcohol use in the past. Depakote level was nontoxic. Patient's verbalization of wanting to hurt himself, was most likely secondary to pain. Seen by psychiatry and cleared. Mental status back to baseline   A.Fib  -CHA2DS2-VASc score is 4. Rate is controlled with Coreg. Patient was on Eliquis, which was held for surgery. - It was resumed. However, patient noted to have a lot of bleeding from around his wound. His hemoglobin dropped and he required blood transfusion. His anticoagulation has been discontinued for now. Family informed about the risk of stroke.  Acute blood loss anemia -He was transfused 2 units of blood on 2/8. Hemoglobin responded only partially. Note of blood noted in the wound VAC. Surgical PA examined the wound and found a lot of bleeding from surrounding desquamated area. Anticoagulation was contributing to this. This was stopped. Patient transfused one more unit of blood on 2/12. Hemoglobin is now 9.0   Likely mild AKI on CKD stage 3/hypernatremia/hypokalemia -Creatinine baseline 1.4, presented with creatinine 1.6 very close to baseline. Patient had good response to IV Lasix yesterday. Creatinine improved this morning. Sodium level is normal. Potassium to be repleted.  Acute on Chronic diastolic CHF  Patient's chest x-ray from 2/showed bilateral infiltrates. Concern was for infection versus pulmonary edema. Patient appears to have responded to Lasix. X-ray from today shows improvement. So, this is more in line with pulmonary edema. He'll be given an additional dose of IV Lasix today. He is already on potassium  which will be repleted. He was already restarted on torsemide. The dose of this medication could be increased depending on his clinical status tomorrow. Patient was on spironolactone at home, which was held at admission due to renal insufficiency.  Hepatic and pancreatic lesions -Cystic lesions incidentally noted on CT scan.  -MRI done and showed possible liver cirrhosis, pancreatic lesion does not look malignant, likely benign versus pseudocyst. Considering his overall poor health and possible dementia, he does not merit further evaluation at this time. Due to poor quality of MRI obtained during this hospitalization, a repeat MRI could be considered in 6 months if patient is able to better cooperate at that time.  Chronic venous insufficiency  Stable. Wound care. Per nursing staff, patient's oral intake has been good.  Goals of care Patient also noted to have generalized deconditioning. His quality of life appears to be poor. His activity level and functional  status is not known, but likely poor considering presence of decubitus ulcer. Palliative medicine has seen the patient and discussed with family. Now DO NOT RESUSCITATE.She was not progressing well. Now with new fever. Palliative medicine to reevaluate patient today.    DVT Prophylaxis: Apixaban was stopped due to bleeding. He has bilateral venous stasis with dressing. Cannot use SCDs. Code Status: DO NOT RESUSCITATE  Family Communication: No family at bedside. Discussed with the son in detail 2/10. He was informed about denial from insurance for patient to go to [place]. He was also told about bleeding issues and need to stop anticoagulation for now. Anticipate he will need expedited appeal and transferred to [facility] after that Disposition Plan: Await acute issues to resolve. Insurance continues to deny admission to [place]. He may have to go to SNF when active issues resolve.   LOS: 22 days   [facility] Pager [phone]    01/21/2017, 9:11 AM  If 7PM-7AM, please contact night-coverage at [email or web address], password [facility]

## 2017-01-22 LAB — CBC
HEMATOCRIT: 26.9 % — AB (ref 39.0–52.0)
Hemoglobin: 8.4 g/dL — ABNORMAL LOW (ref 13.0–17.0)
MCH: 27.8 pg (ref 26.0–34.0)
MCHC: 31.2 g/dL (ref 30.0–36.0)
MCV: 89.1 fL (ref 78.0–100.0)
Platelets: 241 10*3/uL (ref 150–400)
RBC: 3.02 MIL/uL — ABNORMAL LOW (ref 4.22–5.81)
RDW: 18 % — AB (ref 11.5–15.5)
WBC: 6.2 10*3/uL (ref 4.0–10.5)

## 2017-01-22 MED ORDER — AMOXICILLIN-POT CLAVULANATE 875-125 MG PO TABS: 1.0000 | ORAL_TABLET | Freq: Two times a day (BID) | ORAL | 0 refills | Status: AC

## 2017-01-22 MED ORDER — PRO-STAT SUGAR FREE PO LIQD: 30.0000 mL | Freq: Four times a day (QID) | ORAL | 0 refills | Status: DC

## 2017-01-22 MED ORDER — COLLAGENASE 250 UNIT/GM EX OINT: TOPICAL_OINTMENT | Freq: Every day | CUTANEOUS | 0 refills | Status: DC

## 2017-01-22 MED ORDER — HYDROCODONE-ACETAMINOPHEN 5-325 MG PO TABS: 1.0000 | ORAL_TABLET | Freq: Four times a day (QID) | ORAL | 0 refills | Status: DC | PRN

## 2017-01-22 MED ORDER — APIXABAN 5 MG PO TABS
5.0000 mg | ORAL_TABLET | Freq: Two times a day (BID) | ORAL | Status: DC
  Administered 2017-01-22 – 2017-01-23 (×4): 5 mg via ORAL
  Filled 2017-01-22 (×4): qty 1

## 2017-01-22 NOTE — Progress Notes (Signed)
Patient ID: Jesus [name], male   DOB: [birth year], 71 y.o.   MRN: [id number]  [facility] Surgery Progress Note  20 Days Post-Op  Subjective: Patient still has difficulty rolling in bed. Today is day 3 of hydrotherapy.  Objective: Vital signs in last 24 hours: Temp:  [98.2 F (36.8 C)-98.8 F (37.1 C)] 98.6 F (37 C) (02/15 0819) Pulse Rate:  [69-80] 80 (02/15 0819) Resp:  [17-18] 18 (02/15 0819) BP: (138-199)/(52-67) 161/59 (02/15 0819) SpO2:  [96 %-100 %] 100 % (02/15 0819) Weight:  [300 lb 3.2 oz (136.2 kg)] 300 lb 3.2 oz (136.2 kg) (02/14 2221) Last BM Date: 01/21/17  Intake/Output from previous day: 02/14 0701 - 02/15 0700 In: 100 [P.O.:100] Out: 1550 [Urine:1550] Intake/Output this shift: No intake/output data recorded.  PE: Gen: Alert, NAD, cooperative Pulm: Effort normal Abd: Soft, NT/ND Buttock wound: persistent soft yellow tissue at base of wound, wound edges improved with healthy tissue noted, blisters have popped but do not show signs of infection:  Closer look at deeper aspect:     Lab Results:   Recent Labs  01/21/17 0520 01/22/17 0538  WBC 6.4 6.2  HGB 9.0* 8.4*  HCT 28.7* 26.9*  PLT 227 241   BMET  Recent Labs  01/20/17 0746 01/21/17 0520  NA 142 139  K 3.2* 3.7  CL 96* 94*  CO2 34* 34*  GLUCOSE 154* 115*  BUN 38* 30*  CREATININE 1.31* 1.19  CALCIUM 8.5* 8.4*   PT/INR No results for input(s): LABPROT, INR in the last 72 hours. CMP     Component Value Date/Time   NA 139 01/21/2017 0520   NA 133 (A) 12/04/2016   K 3.7 01/21/2017 0520   CL 94 (L) 01/21/2017 0520   CO2 34 (H) 01/21/2017 0520   GLUCOSE 115 (H) 01/21/2017 0520   BUN 30 (H) 01/21/2017 0520   BUN 38 (A) 12/04/2016   CREATININE 1.19 01/21/2017 0520   CALCIUM 8.4 (L) 01/21/2017 0520   PROT 6.3 (L) 01/01/2017 0518   ALBUMIN 1.9 (L) 01/01/2017 0518   AST 23 01/01/2017 0518   ALT 20 01/01/2017 0518   ALKPHOS 171 (H) 01/01/2017 0518   BILITOT 1.5 (H)  01/01/2017 0518   GFRNONAA >60 01/21/2017 0520   GFRAA >60 01/21/2017 0520   Lipase     Component Value Date/Time   LIPASE 27 12/29/2016 1916       Studies/Results: No results found.  Anti-infectives: Anti-infectives    Start     Dose/Rate Route Frequency Ordered Stop   01/22/17 0000  amoxicillin-clavulanate (AUGMENTIN) 875-125 MG tablet     1 tablet Oral Every 12 hours 01/22/17 0934 01/29/17 2359   01/21/17 1000  amoxicillin-clavulanate (AUGMENTIN) 875-125 MG per tablet 1 tablet     1 tablet Oral Every 12 hours 01/21/17 0914     01/19/17 1200  Ampicillin-Sulbactam (UNASYN) 3 g in sodium chloride 0.9 % 100 mL IVPB  Status:  Discontinued     3 g 200 mL/hr over 30 Minutes Intravenous Every 6 hours 01/19/17 1122 01/21/17 0914   01/05/17 2000  vancomycin (VANCOCIN) IVPB 1000 mg/200 mL premix     1,000 mg 200 mL/hr over 60 Minutes Intravenous Every 24 hours 01/05/17 1027 01/11/17 2231   01/01/17 2000  vancomycin (VANCOCIN) IVPB 1000 mg/200 mL premix  Status:  Discontinued     1,000 mg 200 mL/hr over 60 Minutes Intravenous Every 12 hours 01/01/17 1314 01/05/17 1010   12/30/16 2200  vancomycin (VANCOCIN) 1,500  mg in sodium chloride 0.9 % 500 mL IVPB  Status:  Discontinued     1,500 mg 250 mL/hr over 120 Minutes Intravenous Every 24 hours 12/29/16 2029 01/01/17 1314   12/30/16 0600  Ampicillin-Sulbactam (UNASYN) 3 g in sodium chloride 0.9 % 100 mL IVPB  Status:  Discontinued     3 g 200 mL/hr over 30 Minutes Intravenous Every 8 hours 12/30/16 0518 12/30/16 0537   12/30/16 0600  piperacillin-tazobactam (ZOSYN) IVPB 3.375 g     3.375 g 12.5 mL/hr over 240 Minutes Intravenous Every 8 hours 12/30/16 0537 01/11/17 2359   12/29/16 2030  vancomycin (VANCOCIN) 2,000 mg in sodium chloride 0.9 % 500 mL IVPB     2,000 mg 250 mL/hr over 120 Minutes Intravenous  Once 12/29/16 2028 12/29/16 2306       Assessment/Plan Large infected sacral decub ulcer S/p debridement1/26 Dr. Derrell Lollingamirez -  POD 20 - currently getting hydrotherapy  Plan - Wound appears to be improved with 3 days of hydrotherapy. Ok to stop hydro after today and return to wound vac with 3x weekly changes.   LOS: 23 days    Edson SnowballBROOKE A MILLER , [facility] 01/22/2017, 9:45 AM Pager: [phone] Consults: [phone] Mon-Fri 7:00 am-4:30 pm Sat-Sun 7:00 am-11:30 am

## 2017-01-22 NOTE — Progress Notes (Signed)
Physical Therapy Wound Treatment Patient Details  Name: Jesus [name] MRN: [id number] Date of Birth: 1946-01-06  Today's Date: 01/22/2017 Time: 2563-8937 Time Calculation (min): 34 min  Subjective  Subjective: Pt reports, If we got to do it, let's do it! But do it quick."  Pt remains agreeable to hydrotherapy.  Remains to require significant assistance to roll but able to position better for tx.   Patient and Family Stated Goals: Pt did not state goals during session Prior Treatments: I&D 01/02/17, wound vac  Pain Score: 4/10 with debridement to wound bed and positioning to sidelying.  Pt reports pain in sacrum and generalized pain when rolling.   Wound Assessment  Pressure Injury 12/29/16 Unstageable - Full thickness tissue loss in which the base of the ulcer is covered by slough (yellow, tan, gray, green or brown) and/or eschar (tan, brown or black) in the wound bed. (Active)  Dressing Type ABD;Barrier Film (skin prep);Gauze (Comment);Moist to dry 01/22/2017 10:43 AM  Dressing Intact;New drainage 01/22/2017 10:43 AM  Dressing Change Frequency Daily 01/22/2017 10:43 AM  State of Healing Early/partial granulation 01/22/2017 10:43 AM  Site / Wound Assessment Bleeding;Red;Yellow 01/22/2017 10:43 AM  % Wound base Red or Granulating 75% 01/22/2017 10:43 AM  % Wound base Yellow/Fibrinous Exudate 25% 01/22/2017 10:43 AM  % Wound base Black/Eschar 0% 01/22/2017 10:43 AM  % Wound base Other/Granulation Tissue (Comment) 0% 01/22/2017 10:43 AM  Peri-wound Assessment Erythema (blanchable) 01/22/2017 10:43 AM  Wound Length (cm) 15.2 cm 01/19/2017  2:59 PM  Wound Width (cm) 17.4 cm 01/19/2017  2:59 PM  Wound Depth (cm) 6.3 cm 01/19/2017  2:59 PM  Undermining (cm) 5 cm at 10:00, 3.2 cm at 4:00, 5.2 cm at 9:00, 3.6 cm at 7:00. 01/19/2017  2:59 PM  Margins Unattached edges (unapproximated) 01/22/2017 10:43 AM  Drainage Amount Moderate 01/22/2017 10:43 AM  Drainage Description Serous;Serosanguineous;Purulent  01/22/2017 10:43 AM  Treatment Debridement (Selective);Hydrotherapy (Pulse lavage);Packing (Saline gauze) 01/20/2017  2:01 PM         Santyl applied to wound bed prior to applying dressing.  Hydrotherapy Pulsed lavage therapy - wound location: Sacrum Pulsed Lavage with Suction (psi): 12 psi Pulsed Lavage with Suction - Normal Saline Used: 1000 mL Pulsed Lavage Tip: Tip with splash shield Selective Debridement Selective Debridement - Location: Sacrum Selective Debridement - Tools Used: Forceps;Scissors Selective Debridement - Tissue Removed: Yellow sloughthy necrotic tissue   Wound Assessment and Plan  Wound Therapy - Assess/Plan/Recommendations Wound Therapy - Clinical Statement: Day 4 of hydrotherapy post d/c of wound vac.  Pt remains more relaxed during treatment and better positioned.  Surgery reports to d/c hydro and place wound vac.   At this time goals are met.   Wound Therapy - Functional Problem List: Decreased tolerance for OOB, bed mobility; pain Factors Delaying/Impairing Wound Healing: Altered sensation;Immobility;Multiple medical problems;Vascular compromise Hydrotherapy Plan:  (d/c hyrdo and await wound vac placement from [facility].  ) Wound Therapy - Frequency:  (d/c hyrdo at this time per [facility] and WOC.  ) Wound Therapy - Follow Up Recommendations: Skilled nursing facility Wound Plan: See above  Wound Therapy Goals- Improve the function of patient's integumentary system by progressing the wound(s) through the phases of wound healing (inflammation - proliferation - remodeling) by: Decrease Necrotic Tissue to: 25% Decrease Necrotic Tissue - Progress: Met Increase Granulation Tissue to: 75% Increase Granulation Tissue - Progress: Met  Goals will be updated until maximal potential achieved or discharge criteria met.  Discharge criteria: when goals achieved, discharge from hospital,  MD decision/surgical intervention, no progress towards goals, refusal/missing three consecutive  treatments without notification or medical reason.  GP     Jesus [name] Jesus [name] 01/22/2017, 10:55 AM Jesus [name], PTA pager [phone]

## 2017-01-22 NOTE — Progress Notes (Signed)
[facility] PROGRESS NOTE  Jesus [name] LFY:[id number] DOB: March 28, 1946 DOA: 12/29/2016  PCP: [name]  Brief History/Interval Summary: Philemon Kingdom Prevetteis a 71 y.o.malewith medical history significant for A.Fib, HLD, HTN, sacral decubitus ulcer. Patient presented to the ED via EMS from [facility] with c/o confusion and elevated WBC on routine labs. Concern was for infection in the sacral decubitus. Patient was hospitalized for management. Patient was placed on broad-spectrum antibiotics. Anticoagulation was held. Patient underwent surgical debridement on 1/26.Patient also noted to have hepatic and pancreatic lesions noted incidentally on CT scan. Palliative care was consulted. Patient remains encephalopathic. Family elected to pursue treatment for now. He is now DO NOT RESUSCITATE. He again had some bleeding from around his wound. Anticoagulation was held. Patient developed fever on 2/11. Chest x-ray showed either fluid overload or infection. Patient started on Unasyn due to concern for aspiration. Also given Lasix.  Assessment/Plan:    Infected Decubitus ulcer of sacral region -Patient was found to have large sacral decubitus ulcer, unstageable with concerns for infection.  -CT scan did show some air pockets without any evidence for abscess. Started on vancomycin and Zosyn. Wound care was consulted. Subsequently, general surgery was consulted. Patient's anticoagulation was held.  S/p debridement1/26 Dr. Rosendo Gros. Patient was placed on vancomycin and Zosyn. He completed a course of these antibiotics. -Patient received hydrotherapy. Now has a wound vac. -Needs to improve his nutritional status to heal the wounds. -Palliative medicine met with the family, DNR for now but they want to continue to treat and see how he does. -Patient subsequently started bleeding around the wound area. Required blood transfusion. Anticoagulation has been stopped for now.  Bleeding appears to have slowed down some. wound improved with hydrotherapy and BID wet to dry dressing changes    Fever with abnormal chest x-ray  Noted to have a fever on the night of 2/11. Now afebrile for 48 hours. Chest x-ray showed bilateral infiltrates. He was febrile. There was concern for aspiration. Patient was started on Unasyn. Chest x-ray repeated  and shows improvement. This is more in line with pulmonary edema rather than infection. Patient seems to be asymptomatic. He'll be given an additional dose of Lasix today. Continue Augmentin   5 more days   Acute encephalopathy in the setting of questionable dementia, completely resolved Some of this is due to baseline cognitive impairment. According to patient's son, patient has had issues with memory for a few months. Patient was noted to be on Aricept a few months ago. Currently on Depakote and Seroquel. Mental status has been stable. Workup done here included B-12 level which was normal. TSH normal. RPR and HIV are nonreactive. EEG did not show any epileptiform activity. MRI brain was limited due to motion, however, did not show any acute findings. Atrophy was present. Continue thiamine considering history of alcohol use in the past. Depakote level was nontoxic. Patient's verbalization of wanting to hurt himself, was most likely secondary to pain. Seen by psychiatry and cleared. Mental status back to baseline   A.Fib  -CHA2DS2-VASc score is 4. Rate is controlled with Coreg. Patient was on Eliquis, which was held for surgery. - It was resumed. However, patient noted to have a lot of bleeding from around his wound. His hemoglobin dropped and he required blood transfusion. His anticoagulation was held again, his wounds do seem to be healing. He is at increased risk of CVA/PE/stroke. Will resume anticoagulation and that the patient starts actively bleeding or hemoglobin drops  again, consider stopping it permanently.   Acute blood  loss anemia -He was transfused 2 units of blood on 2/8. Hemoglobin responded only partially. Note of blood noted in the wound VAC. Surgical PA examined the wound and found a lot of bleeding from surrounding desquamated area. Anticoagulation was contributing to this. This was stopped. Patient transfused one more unit of blood on 2/12. Hemoglobin is now 9.0 >8.4  Likely mild AKI on CKD stage 3/hypernatremia/hypokalemia -Creatinine baseline 1.4, presented with creatinine 1.6 very close to baseline. Patient had good response to IV Lasix yesterday. Creatinine improved this morning. Sodium level is normal. Potassium to be repleted.  Acute on Chronic diastolic CHF  Patient's chest x-ray from 2/showed bilateral infiltrates. Concern was for infection versus pulmonary edema. Patient appears to have responded to Lasix. X-ray from today shows improvement. So, this is more in line with pulmonary edema. He'll be given an additional dose of IV Lasix today. He is already on potassium which will be repleted. He was already restarted on torsemide. The dose of this medication could be increased depending on his clinical status tomorrow. Patient was on spironolactone at home, which was held at admission due to renal insufficiency.  Hepatic and pancreatic lesions -Cystic lesions incidentally noted on CT scan.  -MRI done and showed possible liver cirrhosis, pancreatic lesion does not look malignant, likely benign versus pseudocyst. Considering his overall poor health and possible dementia, he does not merit further evaluation at this time. Due to poor quality of MRI obtained during this hospitalization, a repeat MRI could be considered in 6 months if patient is able to better cooperate at that time.  Chronic venous insufficiency  Stable. Wound care. Per nursing staff, patient's oral intake has been good.  Goals of care Patient also noted to have generalized deconditioning. His quality of life appears to be poor. His  activity level and functional status is not known, but likely poor considering presence of decubitus ulcer. Palliative medicine has seen the patient and discussed with family. Now DO NOT RESUSCITATE.She was not progressing well. Now with new fever. Palliative medicine to reevaluate patient today.    DVT Prophylaxis: Apixaban was stopped due to bleeding. He has bilateral venous stasis with dressing. Cannot use SCDs. Code Status: DO NOT RESUSCITATE  Family Communication: No family at bedside. Discussed with the son in detail 2/10. He was informed about denial from insurance for patient to go to [facility]. He was also told about bleeding issues and need to stop anticoagulation for now. Anticipate he will need expedited appeal and transferred to [facility] after that Disposition Plan: Await acute issues to resolve. Insurance continues to deny admission to [facility]. He may have to go to SNF when active issues resolve.     Consultants: Gen. surgery. Palliative medicine.  Procedures: Debridement of the sacral wound  Antibiotics: Completed a course of vancomycin and Zosyn Unasyn started on 2/12  Subjective/Interval History: Patient remains confused and disoriented. Denies any difficulty breathing. Denies any cough.  ROS: Unable to do  Objective:  Vital Signs  Vitals:   01/21/17 1716 01/21/17 2221 01/22/17 0615 01/22/17 0819  BP: (!) 199/55 (!) 155/67 (!) 157/52 (!) 161/59  Pulse: 69 71 71 80  Resp: 17 18 18 18   Temp: 98.8 F (37.1 C) 98.6 F (37 C) 98.4 F (36.9 C) 98.6 F (37 C)  TempSrc: Oral Oral Axillary Oral  SpO2: 100% 100% 100% 100%  Weight:  (!) 136.2 kg (300 lb 3.2 oz)    Height:  Intake/Output Summary (Last 24 hours) at 01/22/17 0938 Last data filed at 01/22/17 0600  Gross per 24 hour  Intake              100 ml  Output             1550 ml  Net            -1450 ml   Filed Weights   01/19/17 2305 01/20/17 2210 01/21/17 2221  Weight: 131.5 kg (290 lb) 132.5 kg (292 lb  1.6 oz) (!) 136.2 kg (300 lb 3.2 oz)    General appearance: alert, distracted and no distress Resp: Coarse breath sounds bilaterally. No crackles heard. Few wheezes. No rhonchi. Cardio: regular rate and rhythm, S1, S2 normal, no murmur, click, rub or gallop GI: soft, non-tender; bowel sounds normal; no masses,  no organomegaly Extremities: venous stasis dermatitis. Both legs are covered with dressing. Pulses: 2+ and symmetric Neurologic: Remains distracted and confused. No focal deficits.  Wound VAC continues to show accumulation of red colored fluid.   Lab Results:  Data Reviewed: I have personally reviewed following labs and imaging studies  CBC:  Recent Labs Lab 01/18/17 0854 01/19/17 0524 01/20/17 0746 01/21/17 0520 01/22/17 0538  WBC 7.5 7.7 8.0 6.4 6.2  HGB 8.2* 8.0* 8.9* 9.0* 8.4*  HCT 26.6* 25.7* 29.3* 28.7* 26.9*  MCV 90.2 91.5 90.2 89.1 89.1  PLT 266 252 243 227 625    Basic Metabolic Panel:  Recent Labs Lab 01/17/17 0510 01/18/17 0854 01/19/17 0524 01/20/17 0746 01/21/17 0520  NA 145 145 146* 142 139  K 3.8 3.4* 3.8 3.2* 3.7  CL 95* 100* 100* 96* 94*  CO2 35* 34* 35* 34* 34*  GLUCOSE 109* 156* 109* 154* 115*  BUN 33* 35* 38* 38* 30*  CREATININE 1.20 1.27* 1.34* 1.31* 1.19  CALCIUM 9.2 9.0 8.8* 8.5* 8.4*    GFR: Estimated Creatinine Clearance: 80.3 mL/min (by C-G formula based on SCr of 1.19 mg/dL).   Radiology Studies: No results found.   Medications:  Scheduled: . sodium chloride   Intravenous Once  . amoxicillin-clavulanate  1 tablet Oral Q12H  . apixaban  5 mg Oral BID  . atorvastatin  80 mg Oral q1800  . carvedilol  6.25 mg Oral BID WC  . collagenase   Topical Daily  . divalproex  500 mg Oral QHS  . feeding supplement (ENSURE ENLIVE)  237 mL Oral TID BM  . feeding supplement (PRO-STAT SUGAR FREE 64)  30 mL Oral QID  . mouth rinse  15 mL Mouth Rinse BID  . pantoprazole  40 mg Oral BID AC  . QUEtiapine  12.5 mg Oral BID  .  saccharomyces boulardii  250 mg Oral BID  . thiamine  100 mg Oral Daily  . torsemide  20 mg Oral QODAY   Continuous:  WLS:[id number], HYDROcodone-acetaminophen, HYDROmorphone (DILAUDID) injection, hydrOXYzine            LOS: 23 days   [facility]  [facility] Pager [phone]  01/22/2017, 9:38 AM  If 7PM-7AM, please contact night-coverage at [email or web address], password [facility]

## 2017-01-23 DIAGNOSIS — F331 Major depressive disorder, recurrent, moderate: Secondary | ICD-10-CM | POA: Diagnosis not present

## 2017-01-23 DIAGNOSIS — F1120 Opioid dependence, uncomplicated: Secondary | ICD-10-CM | POA: Diagnosis not present

## 2017-01-23 DIAGNOSIS — L89890 Pressure ulcer of other site, unstageable: Secondary | ICD-10-CM | POA: Diagnosis not present

## 2017-01-23 DIAGNOSIS — R2689 Other abnormalities of gait and mobility: Secondary | ICD-10-CM | POA: Diagnosis not present

## 2017-01-23 DIAGNOSIS — R5381 Other malaise: Secondary | ICD-10-CM | POA: Diagnosis not present

## 2017-01-23 DIAGNOSIS — L8992 Pressure ulcer of unspecified site, stage 2: Secondary | ICD-10-CM | POA: Diagnosis not present

## 2017-01-23 DIAGNOSIS — I872 Venous insufficiency (chronic) (peripheral): Secondary | ICD-10-CM | POA: Diagnosis not present

## 2017-01-23 DIAGNOSIS — G8918 Other acute postprocedural pain: Secondary | ICD-10-CM | POA: Diagnosis not present

## 2017-01-23 DIAGNOSIS — D638 Anemia in other chronic diseases classified elsewhere: Secondary | ICD-10-CM | POA: Diagnosis not present

## 2017-01-23 DIAGNOSIS — I110 Hypertensive heart disease with heart failure: Secondary | ICD-10-CM | POA: Diagnosis not present

## 2017-01-23 DIAGNOSIS — I890 Lymphedema, not elsewhere classified: Secondary | ICD-10-CM | POA: Diagnosis not present

## 2017-01-23 DIAGNOSIS — G894 Chronic pain syndrome: Secondary | ICD-10-CM | POA: Diagnosis not present

## 2017-01-23 DIAGNOSIS — K709 Alcoholic liver disease, unspecified: Secondary | ICD-10-CM | POA: Diagnosis not present

## 2017-01-23 DIAGNOSIS — L89159 Pressure ulcer of sacral region, unspecified stage: Secondary | ICD-10-CM | POA: Diagnosis not present

## 2017-01-23 DIAGNOSIS — R278 Other lack of coordination: Secondary | ICD-10-CM | POA: Diagnosis not present

## 2017-01-23 DIAGNOSIS — L03119 Cellulitis of unspecified part of limb: Secondary | ICD-10-CM | POA: Diagnosis not present

## 2017-01-23 DIAGNOSIS — K740 Hepatic fibrosis: Secondary | ICD-10-CM | POA: Diagnosis not present

## 2017-01-23 DIAGNOSIS — R938 Abnormal findings on diagnostic imaging of other specified body structures: Secondary | ICD-10-CM | POA: Diagnosis not present

## 2017-01-23 DIAGNOSIS — G9340 Encephalopathy, unspecified: Secondary | ICD-10-CM | POA: Diagnosis not present

## 2017-01-23 DIAGNOSIS — L89300 Pressure ulcer of unspecified buttock, unstageable: Secondary | ICD-10-CM | POA: Diagnosis not present

## 2017-01-23 DIAGNOSIS — R4189 Other symptoms and signs involving cognitive functions and awareness: Secondary | ICD-10-CM | POA: Diagnosis not present

## 2017-01-23 DIAGNOSIS — R531 Weakness: Secondary | ICD-10-CM | POA: Diagnosis not present

## 2017-01-23 DIAGNOSIS — I5032 Chronic diastolic (congestive) heart failure: Secondary | ICD-10-CM | POA: Diagnosis not present

## 2017-01-23 DIAGNOSIS — F1029 Alcohol dependence with unspecified alcohol-induced disorder: Secondary | ICD-10-CM | POA: Diagnosis not present

## 2017-01-23 DIAGNOSIS — L89154 Pressure ulcer of sacral region, stage 4: Secondary | ICD-10-CM | POA: Diagnosis not present

## 2017-01-23 DIAGNOSIS — K7030 Alcoholic cirrhosis of liver without ascites: Secondary | ICD-10-CM | POA: Diagnosis not present

## 2017-01-23 DIAGNOSIS — G8928 Other chronic postprocedural pain: Secondary | ICD-10-CM | POA: Diagnosis not present

## 2017-01-23 DIAGNOSIS — I480 Paroxysmal atrial fibrillation: Secondary | ICD-10-CM | POA: Diagnosis not present

## 2017-01-23 DIAGNOSIS — R41841 Cognitive communication deficit: Secondary | ICD-10-CM | POA: Diagnosis not present

## 2017-01-23 DIAGNOSIS — K862 Cyst of pancreas: Secondary | ICD-10-CM | POA: Diagnosis not present

## 2017-01-23 DIAGNOSIS — I4891 Unspecified atrial fibrillation: Secondary | ICD-10-CM | POA: Diagnosis not present

## 2017-01-23 DIAGNOSIS — R410 Disorientation, unspecified: Secondary | ICD-10-CM | POA: Diagnosis not present

## 2017-01-23 DIAGNOSIS — M6281 Muscle weakness (generalized): Secondary | ICD-10-CM | POA: Diagnosis not present

## 2017-01-23 DIAGNOSIS — L89150 Pressure ulcer of sacral region, unstageable: Secondary | ICD-10-CM | POA: Diagnosis not present

## 2017-01-23 DIAGNOSIS — I82210 Acute embolism and thrombosis of superior vena cava: Secondary | ICD-10-CM | POA: Diagnosis not present

## 2017-01-23 DIAGNOSIS — R488 Other symbolic dysfunctions: Secondary | ICD-10-CM | POA: Diagnosis not present

## 2017-01-23 DIAGNOSIS — I482 Chronic atrial fibrillation: Secondary | ICD-10-CM | POA: Diagnosis not present

## 2017-01-23 DIAGNOSIS — G9341 Metabolic encephalopathy: Secondary | ICD-10-CM | POA: Diagnosis not present

## 2017-01-23 DIAGNOSIS — I429 Cardiomyopathy, unspecified: Secondary | ICD-10-CM | POA: Diagnosis not present

## 2017-01-23 LAB — CBC
HEMATOCRIT: 26.9 % — AB (ref 39.0–52.0)
Hemoglobin: 8.4 g/dL — ABNORMAL LOW (ref 13.0–17.0)
MCH: 27.9 pg (ref 26.0–34.0)
MCHC: 31.2 g/dL (ref 30.0–36.0)
MCV: 89.4 fL (ref 78.0–100.0)
PLATELETS: 227 10*3/uL (ref 150–400)
RBC: 3.01 MIL/uL — ABNORMAL LOW (ref 4.22–5.81)
RDW: 18.1 % — AB (ref 11.5–15.5)
WBC: 6.6 10*3/uL (ref 4.0–10.5)

## 2017-01-23 NOTE — Care Management Note (Signed)
Case Management Note  Patient Details  Name: [name] M Rickel MRN: [id number] Date of Birth: 1946/01/05  Subjective/Objective:   CM following for progession and d/c planning.                  Action/Plan: Pt for [facility]. [facility] placed an appeal to the insurance provider on behalf of this pt and received approval to provide long term acute care for this pt wound. Staff preparing EMTALA form and arranging for transportation . Pt daughter notified by CSW, of insurance approval for LTAC, who was very pleased with this approval.   Expected Discharge Date:  01/23/17               Expected Discharge Plan:  Long Term Acute Care (LTAC) [facility]  In-House Referral:  Clinical Social Work  Discharge planning Services  CM Consult  Post Acute Care Choice:  NA Choice offered to:  NA  DME Arranged:  N/A DME Agency:  NA  HH Arranged:  NA HH Agency:  NA  Status of Service:  Completed, signed off  If discussed at [employer] Meetings, dates discussed:    Additional Comments:  Starlyn SkeansRoyal, Vlad Mayberry U, RN 01/23/2017, 3:54 PM

## 2017-01-23 NOTE — Progress Notes (Signed)
Receiving nurse requested to to discontinue wound vac from the patient then do the wet to dry dressing,for thee reason of which that ''wound vac machine would not available tonight in their facility".this Clinical research associatewriter told to the nurse Jesus [name] that we could not removed the wound vac dressing from the patient per M.D. and we would rather discharged patient to them by tomorrow.Nurses from [facility] hesitant to have wait the patient till tomorrow and told this Clinical research associatewriter that this is their protocol.This Clinical research associatewriter told to [facility] nurses ,i could send the patient with wound vac dressing on/intact and clamp the tube and once the patient arrived on their facility,they could do their own protocol.Nurses from [facility] agreed to this.Discharging M.D notified and agreed on this.

## 2017-01-23 NOTE — Progress Notes (Signed)
Receiving nurse requested to discontinue the wound vac from the patient for the reason of that ''wound vac machine " is [facility].Paged M.D and will discharged tomorrow.

## 2017-01-23 NOTE — Progress Notes (Signed)
Report given to [facility] Mungod R.N. [facility].

## 2017-01-23 NOTE — [place] FL2 (Signed)
[place] MEDICAID FL2 LEVEL OF CARE SCREENING TOOL     IDENTIFICATION  Patient Name: MIQUEAS WHILDEN Birthdate: 06-07-1946 Sex: male Admission Date (Current Location): 12/29/2016  [facility] and [place] Number:  Producer, television/film/video and Address:  [facility] [place], [address] [place], [place] [address]      Provider Number: [id number]  Attending Physician Name and Address:  Richarda Overlie, MD  Relative Name and Phone Number:  [facility] - Daughter;  [phone] (mobile)     Current Level of Care: Hospital Recommended Level of Care: Skilled Nursing Facility Prior Approval Number:    Date Approved/Denied:   PASRR Number: [id number]  (Eff. 11/26/16)  Discharge Plan: SNF    Current Diagnoses: Patient Active Problem List   Diagnosis Date Noted  . Abnormal CXR   . Disorientation   . DNR (do not resuscitate)   . Palliative care by specialist   . Decubitus ulcer of sacral region, unstageable (HCC) 12/30/2016  . Delirium 12/30/2016  . Cellulitis of lower extremity   . Hyponatremia 07/15/2015  . Cellulitis of right lower extremity 07/15/2015  . Acute renal failure superimposed on stage 3 chronic kidney disease (HCC)   . Hypokalemia   . Chronic atrial fibrillation (HCC) 07/09/2015  . Chronic anemia 07/09/2015  . CHF exacerbation (HCC) 07/08/2015  . Chronic diastolic CHF (congestive heart failure) (HCC) 04/02/2015  . Chronic diastolic heart failure (HCC) 01/25/2015  . Cellulitis 01/17/2015  . Pulmonary vascular congestion   . Congestive heart disease (HCC)   . Peripheral edema   . Acute exacerbation of CHF (congestive heart failure) (HCC) 01/04/2015  . 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 Renal artery stenosis (HCC) 02/17/2014  . Peripheral vascular disease (HCC) 04/15/2013  . Chronic venous insufficiency 04/15/2013  . Morbid  obesity (HCC) 06/11/2012  . Acute on chronic diastolic CHF (congestive heart failure) (HCC) 06/02/2012  . Unspecified essential hypertension 05/18/2012  . Atrial fibrillation (HCC) 04/27/2012  . CHF (congestive heart failure) (HCC) 04/27/2012  . Hypertension 04/27/2012    Orientation RESPIRATION BLADDER Height & Weight     Self  Normal Incontinent Weight: (!) 300 lb 3.2 oz (136.2 kg) Height:  5\' 10"  (177.8 cm)  BEHAVIORAL SYMPTOMS/MOOD NEUROLOGICAL BOWEL NUTRITION STATUS      Incontinent Diet (Low sodium - Heart healthy)  AMBULATORY STATUS COMMUNICATION OF NEEDS Skin   Total Care (Patient unable to ambulate with PT) Verbally Other (Comment) (Stage 4 pressure injury- wound vac used for wound care (see wound care note in additional information);  Stag 2 pressure ulcers to right/left heels; deep tissue injury to right, proximal pretibial; pressure injury stage 2 to anterior foot; )                       Personal Care Assistance Level of Assistance  Bathing, Feeding, Dressing Bathing Assistance: Maximum assistance Feeding assistance: Limited assistance (Assistance with set-up) Dressing Assistance: Maximum assistance     Functional Limitations Info  Sight, Hearing, Speech Sight Info: Adequate Hearing Info: Adequate Speech Info: Adequate    SPECIAL CARE FACTORS FREQUENCY  PT (By licensed PT), Speech therapy     PT Frequency: Evaluated 01/05/17 and a minimum of 3X per week therapy recommended       Speech Therapy Frequency: Speech eval 1/25 swallow eval      Contractures Contractures Info: Not present    Additional  Factors Info  Code Status, Allergies Code Status Info: DNR Allergies Info: Amlodipine Besylate; Levaquin            Current Medications (01/23/2017):  This is the current hospital active medication list Current Facility-Administered Medications  Medication Dose Route Frequency Provider Last Rate Last Dose  . 0.9 %  sodium chloride infusion   Intravenous  Once Osvaldo Shipper, MD      . acetaminophen (TYLENOL) tablet 650 mg  650 mg Oral Q6H PRN Hillary Bow, DO   650 mg at 01/19/17 1746  . amoxicillin-clavulanate (AUGMENTIN) 875-125 MG per tablet 1 tablet  1 tablet Oral Q12H Richarda Overlie, MD   1 tablet at 01/23/17 1003  . apixaban (ELIQUIS) tablet 5 mg  5 mg Oral BID Richarda Overlie, MD   5 mg at 01/23/17 1003  . atorvastatin (LIPITOR) tablet 80 mg  80 mg Oral q1800 Hillary Bow, DO   80 mg at 01/22/17 1800  . carvedilol (COREG) tablet 6.25 mg  6.25 mg Oral BID WC Hillary Bow, DO   6.25 mg at 01/23/17 8119  . collagenase (SANTYL) ointment   Topical Daily Violeta Gelinas, MD      . divalproex (DEPAKOTE SPRINKLE) capsule 500 mg  500 mg Oral QHS Roma Kayser Schorr, NP   500 mg at 01/22/17 2301  . feeding supplement (ENSURE ENLIVE) (ENSURE ENLIVE) liquid 237 mL  237 mL Oral TID BM Clydia Llano, MD   237 mL at 01/23/17 0602  . feeding supplement (PRO-STAT SUGAR FREE 64) liquid 30 mL  30 mL Oral QID Osvaldo Shipper, MD   30 mL at 01/23/17 0603  . HYDROcodone-acetaminophen (NORCO/VICODIN) 5-325 MG per tablet 1 tablet  1 tablet Oral Q6H PRN Hillary Bow, DO   1 tablet at 01/23/17 1021  . HYDROmorphone (DILAUDID) injection 1-2 mg  1-2 mg Intravenous Q2H PRN Axel Filler, MD   1 mg at 01/23/17 0910  . hydrOXYzine (ATARAX/VISTARIL) tablet 25 mg  25 mg Oral TID PRN Hillary Bow, DO   25 mg at 01/23/17 0609  . MEDLINE mouth rinse  15 mL Mouth Rinse BID Osvaldo Shipper, MD   15 mL at 01/22/17 2300  . pantoprazole (PROTONIX) EC tablet 40 mg  40 mg Oral BID AC Osvaldo Shipper, MD   40 mg at 01/23/17 1478  . QUEtiapine (SEROQUEL) tablet 12.5 mg  12.5 mg Oral BID Osvaldo Shipper, MD   12.5 mg at 01/23/17 1004  . saccharomyces boulardii (FLORASTOR) capsule 250 mg  250 mg Oral BID Osvaldo Shipper, MD   250 mg at 01/23/17 1004  . thiamine (VITAMIN B-1) tablet 100 mg  100 mg Oral Daily Osvaldo Shipper, MD   100 mg at 01/23/17 1003  . torsemide (DEMADEX) tablet 20  mg  20 mg Oral QODAY Osvaldo Shipper, MD   20 mg at 01/22/17 1000     Discharge Medications: Please see discharge summary for a list of discharge medications.  Relevant Imaging Results:  Relevant Lab Results:   Additional Information ss#[id number].  Wound care note for 01/23/17:  PLEASE READ NOTE BELOW Patient requires low air loss mattress.  WOC Nurse wound follow up Wound type: Stage 4 Pressure injury with surgical debridement and hydrotherapy Measurement: per PT notes 01/22/17 during hydrotherapy session; 15.2cm x 17.4cm x 6.3cm with undermining         5 cm at 10:00, 3.2 cm at 4:00, 5.2 cm at 9:00, 3.6 cm at 7:00 Wound bed: 75% early pink granulation  tissue, 25% yellow fibrinous  Drainage (amount, consistency, odor) heavy, serosanguinous  Periwound: some skin stripping from tape (MARSI) but no new blistering noted today Dressing procedure/placement/frequency: Discussed patients wound yesterday with PT who has been performing hydrotherapy and with surgical team.  Planned replacement of the NPWT VAC dressing today. 1.  Strip of VAC drape used to protect skin to the left hip. 2.  1pc of black foam used to create bridge on this protected skin to the left hip 3. 3 pc of black foam used to fill the wound bed and cover the open wound edges. 4. 3 pc of ostomy barrier ring used at the distal edge of the wound above the anus to aid in seal of this deep moist area. 5. Sealed at 125mmHG.  Patient tolerated well, received pain meds prior to dressing change.  Continue low air loss mattress, MUST HAVE THIS AT DC FOR WOUND HEALING AND PRESSURE REDISTRIBUTION Maximize nutrition for wound healing Consider MVI, zinc and Vit. C for wound healing.  If patient mobilized to a chair, must have offloading cushion in the chair. Should not be up in a chair more than 1 hour per day.  Cristobal Goldmannrawford, Jassmine Vandruff Bradley, LCSW

## 2017-01-23 NOTE — Clinical Social Work Note (Signed)
CSW signing off as patient will discharge to [facility] this evening. Daughter notified by LTAC representative and this CSW regarding insurance approving admission. Patient will be transported by EMS.  Genelle BalVanessa Dorraine Ellender, MSW, LCSW Licensed Clinical Social Worker Clinical Social Work Department [facility] [phone]

## 2017-01-23 NOTE — Discharge Summary (Signed)
Physician Discharge Summary  NESTA KIMPLE MRN: [id number] DOB/AGE: 1946/07/30 71 y.o.  PCP: Adron Bene, [name]   Admit date: 12/29/2016 Discharge date: 01/23/2017  Discharge Diagnoses:    Principal Problem:   Decubitus ulcer of sacral region, unstageable [facility]) Active Problems:   Atrial fibrillation (HCC)   Chronic venous insufficiency   Chronic diastolic CHF (congestive heart failure) (HCC)   Acute renal failure superimposed on stage 3 chronic kidney disease ([place])   Delirium   DNR (do not resuscitate)   Palliative care by specialist   Disorientation   Abnormal CXR    Follow-up recommendations Follow-up with PCP in 3-5 days , including all  additional recommended appointments as below Follow-up CBC, CMP in 3-5 days Recommend palliative care to follow patient in SNF  VAC dressing instructions  1.  Strip of VAC drape used to protect skin to the left hip. 2.  1pc of black foam used to create bridge on this protected skin to the left hip 3. 3 pc of black foam used to fill the wound bed and cover the open wound edges. 4. 3 pc of ostomy barrier ring used at the distal edge of the wound above the anus to aid in seal of this deep moist area. 5. Sealed at 130mHG.  Patient tolerated well, received pain meds prior to dressing change.  Continue low air loss mattress, MUST HAVE THIS AT DC FOR WOUND HEALING AND PRESSURE REDISTRIBUTION Maximize nutrition for wound healing Consider MVI, zinc and Vit. C for wound healing.  If patient mobilized to a chair, must have offloading cushion in the chair. Should not be up in a chair more than 1 hour per day     Current Discharge Medication List    START taking these medications   Details  !! Amino Acids-Protein Hydrolys (FEEDING SUPPLEMENT, PRO-STAT SUGAR FREE 64,) LIQD Take 30 mLs by mouth 4 (four) times daily. Qty: 900 mL, Refills: 0    amoxicillin-clavulanate (AUGMENTIN) 875-125 MG tablet Take 1 tablet by mouth every 12 (twelve)  hours. Qty: 14 tablet, Refills: 0    collagenase (SANTYL) ointment Apply topically daily. Qty: 15 g, Refills: 0    feeding supplement, ENSURE ENLIVE, (ENSURE ENLIVE) LIQD Take 237 mLs by mouth 3 (three) times daily between meals. Qty: 237 mL, Refills: 12    thiamine 100 MG tablet Take 1 tablet (100 mg total) by mouth daily.     !! - Potential duplicate medications found. Please discuss with provider.    CONTINUE these medications which have CHANGED   Details  divalproex (DEPAKOTE SPRINKLE) 125 MG capsule Take 4 capsules (500 mg total) by mouth at bedtime.    HYDROcodone-acetaminophen (NORCO/VICODIN) 5-325 MG tablet Take 1 tablet by mouth every 6 (six) hours as needed for moderate pain. Qty: 15 tablet, Refills: 0    hydrOXYzine (ATARAX/VISTARIL) 25 MG tablet Take 1 tablet (25 mg total) by mouth 3 (three) times daily as needed (for itching). Qty: 30 tablet, Refills: 0    QUEtiapine (SEROQUEL) 25 MG tablet Take 0.5 tablets (12.5 mg total) by mouth 2 (two) times daily. Qty: 60 tablet, Refills: 0    torsemide (DEMADEX) 20 MG tablet Take 1 tablet (20 mg total) by mouth daily.      CONTINUE these medications which have NOT CHANGED   Details  acetaminophen (TYLENOL) 500 MG tablet Take 500 mg by mouth every 8 (eight) hours as needed for moderate pain (Pain score 4-6/10).    !! Amino Acids-Protein Hydrolys (FEEDING SUPPLEMENT, PRO-STAT SUGAR  FREE 64,) LIQD Take 30 mLs by mouth 2 (two) times daily. Qty: 900 mL, Refills: 0    apixaban (ELIQUIS) 5 MG TABS tablet Take 1 tablet (5 mg total) by mouth 2 (two) times daily. Qty: 60 tablet, Refills: 0    atorvastatin (LIPITOR) 80 MG tablet Take 1 tablet (80 mg total) by mouth daily. Qty: 30 tablet, Refills: 0    carvedilol (COREG) 6.25 MG tablet Take 1 tablet (6.25 mg total) by mouth 2 (two) times daily with a meal. Qty: 60 tablet, Refills: 0    Multiple Vitamins-Minerals (DECUBI-VITE PO) Take 1 capsule by mouth daily.    pantoprazole  (PROTONIX) 40 MG tablet Take 1 tablet (40 mg total) by mouth 2 (two) times daily.    saccharomyces boulardii (FLORASTOR) 250 MG capsule Take 250 mg by mouth 2 (two) times daily. x13 days     !! - Potential duplicate medications found. Please discuss with provider.    STOP taking these medications     divalproex (DEPAKOTE ER) 500 MG 24 hr tablet      spironolactone (ALDACTONE) 25 MG tablet          Discharge Condition:    Prognosis guargded   Discharge Instructions Get Medicines reviewed and adjusted: Please take all your medications with you for your next visit with your Primary MD  Please request your Primary MD to go over all hospital tests and procedure/radiological results at the follow up, please ask your Primary MD to get all Hospital records sent to his/her office.  If you experience worsening of your admission symptoms, develop shortness of breath, life threatening emergency, suicidal or homicidal thoughts you must seek medical attention immediately by calling 911 or calling your MD immediately if symptoms less severe.  You must read complete instructions/literature along with all the possible adverse reactions/side effects for all the Medicines you take and that have been prescribed to you. Take any new Medicines after you have completely understood and accpet all the possible adverse reactions/side effects.   Do not drive when taking Pain medications.   Do not take more than prescribed Pain, Sleep and Anxiety Medications  Special Instructions: If you have smoked or chewed Tobacco in the last 2 yrs please stop smoking, stop any regular Alcohol and or any Recreational drug use.  Wear Seat belts while driving.  Please note  You were cared for by a hospitalist during your hospital stay. Once you are discharged, your primary care physician will handle any further medical issues. Please note that NO REFILLS for any discharge medications will be authorized once you are  discharged, as it is imperative that you return to your primary care physician (or establish a relationship with a primary care physician if you do not have one) for your aftercare needs so that they can reassess your need for medications and monitor your lab values.  Discharge Instructions    Diet - low sodium heart healthy    Complete by:  As directed    Increase activity slowly    Complete by:  As directed        Allergies  Allergen Reactions  . Amlodipine Besylate Swelling and Other (See Comments)    "started off low and ended up severe; if, in fact, that is what's causing the swelling" (06/03/12)  . Levaquin [Levofloxacin In D5w] Rash      Disposition: 03-Skilled Nursing Facility   Consults:  Consultants: Gen. surgery. Palliative medicine    Significant Diagnostic Studies:  Dg Chest  2 View  Result Date: 12/29/2016 CLINICAL DATA:  Confusion and difficulty breathing EXAM: CHEST  2 VIEW COMPARISON:  11/13/2016 FINDINGS: Cardiac shadow is enlarged. The lungs are well aerated bilaterally. No focal infiltrate or sizable effusion is seen. No bony abnormality is noted. Old rib fractures are noted on the right. IMPRESSION: No active cardiopulmonary disease. Electronically Signed   By: Inez Catalina M.D.   On: 12/29/2016 20:54   Mr Brain Wo Contrast  Result Date: 01/04/2017 CLINICAL DATA:  Initial evaluation for acute encephalopathy, confusion, elevated white blood cell count. EXAM: MRI HEAD WITHOUT CONTRAST TECHNIQUE: Multiplanar, multiecho pulse sequences of the brain and surrounding structures were obtained without intravenous contrast. COMPARISON:  None available. FINDINGS: Brain: Study significantly degraded by motion artifact. Diffuse prominence of the CSF containing spaces is compatible with generalized age related cerebral atrophy. Mild patchy T2/FLAIR hyperintensity within the periventricular and deep white matter both cerebral hemispheres most consistent with chronic small  vessel ischemic disease. No abnormal foci of restricted diffusion to suggest acute or subacute ischemia. Gray-white matter differentiation maintained. No evidence for acute or chronic intracranial hemorrhage. No areas of chronic infarction identified. No mass lesion, midline shift or mass effect. Mild ventricular prominence related to global parenchymal volume loss of hydrocephalus. No extra-axial fluid collection. Major dural sinuses are grossly patent. Pituitary gland not well evaluated on this motion degraded study. Vascular: Major intracranial vascular flow voids maintained. Skull and upper cervical spine: Craniocervical junction grossly unremarkable. No obvious abnormality within the upper cervical spine. Bone marrow signal intensity within normal limits. No scalp soft tissue abnormality. Sinuses/Orbits: Globes and orbital soft tissues grossly unremarkable. No significant paranasal sinus disease identified on this motion degraded study. No obvious mastoid effusion. IMPRESSION: 1. Motion degraded study. 2. No acute intracranial infarct or other process identified. 3. Generalized age-related cerebral atrophy with mild chronic small vessel ischemic disease. Electronically Signed   By: Jeannine Boga M.D.   On: 01/04/2017 22:11   Mr Abdomen Wo Contrast  Result Date: 01/06/2017 CLINICAL DATA:  Evaluate liver and pancreas cysts. EXAM: MRI ABDOMEN WITHOUT CONTRAST TECHNIQUE: Multiplanar multisequence MR imaging was performed without the administration of intravenous contrast. COMPARISON:  12/30/2016. FINDINGS: Exam detail is significantly diminished secondary to respiratory motion artifact. The post-contrast portion of the examination was not performed secondary to motion artifact. Lower chest: The heart size is enlarged. Aortic atherosclerosis noted. No pleural or pericardial effusion noted. Hepatobiliary: Relative hypertrophy of the caudate lobe of liver and there are several simple appearing cyst  identified within the liver. The largest is in the lateral segment of left lobe adjacent to the falciform ligament. This measures 4.1 cm, image 19 of series 8. The gallbladder appears within normal limits. No gallbladder wall thickening. No intrahepatic bile duct dilatation. The common bile duct is normal in caliber. Pancreas: Significantly diminished exam detail secondary to motion artifact. There is a Uni locular cystic lesion within the head and uncinate process of the pancreas measuring 4.3 x 3.8 cm, image 28 of series 8. Within the limitation of unenhanced technique, there is no internal septation or soft tissue component identified. No solid mass, inflammatory changes or other parenchymal abnormality identified. Spleen: The spleen is enlarged measuring 13.7 cm in length. No focal splenic abnormality noted. Adrenals/Urinary Tract: Unremarkable appearance of the adrenal glands. No kidney mass or hydronephrosis identified. Stomach/Bowel: The stomach is normal. The small bowel loops have a normal course and caliber without evidence for bowel obstruction. Vascular/Lymphatic: Aortic atherosclerosis. No aneurysm. No upper abdominal  adenopathy. Other:  No ascites or focal fluid collections. Musculoskeletal: No suspicious bone lesions identified. IMPRESSION: 1. Significantly diminished exam detail secondary to motion artifact. Patient was not alert and unable to breath hold during the exam. 2. Morphologic features of liver compatible with cirrhosis. 3. Splenomegaly. 4. Liver cysts. 5. It unilocular cystic structure within the head of pancreas is identified. Although incompletely characterized without IV contrast material there is no internal septation or solid mural component identified. If there is a history of pancreatitis this may represent a pseudocyst. Alternatively, findings may reflect a benign (or less likely malignant) cystic neoplasm of the pancreas. Followup examination in 6 months is advised to re- assess  this lesion. At this time if the patient is unable to fully cooperate with the exam an adequately breath hold a pancreas protocol CT of the upper abdomen may provide more accurate results. Electronically Signed   By: Kerby Moors M.D.   On: 01/06/2017 19:10   Ct Abdomen Pelvis W Contrast  Result Date: 12/30/2016 CLINICAL DATA:  71 year old male with sacral ulcer and pain. EXAM: CT ABDOMEN AND PELVIS WITH CONTRAST TECHNIQUE: Multidetector CT imaging of the abdomen and pelvis was performed using the standard protocol following bolus administration of intravenous contrast. CONTRAST:  75 cc Omnipaque 300 COMPARISON:  None. FINDINGS: Lower chest: The visualized lung bases are clear. There is moderate cardiomegaly with biatrial dilatation and enlargement of the left ventricle. Multi vessel coronary vascular calcification. No intra-abdominal free air or free fluid. Hepatobiliary: Multiple hepatic hypodense lesions measuring up to 3.9 x 4.3 cm in the left lobe of the liver. The larger lesions demonstrate fluid attenuation and likely represents cyst or hemangioma. Smaller lesions are not well characterized. There is slight irregular appearance of the hepatic contour suggestive of underlying cirrhosis. Correlation with clinical exam recommended. Nonemergent MRI without and with contrast is recommended for further evaluation of the hepatic lesions. There is no intrahepatic biliary ductal dilatation. Layering sludge or hyperconcentrated bile noted within the gallbladder. No pericholecystic fluid. Pancreas: There is a 3.8 x 4.5 cm low attenuating/cystic lesion arising from the posterior uncinate process of the pancreas. This lesion may represent a pseudocyst or a cystic pancreatic neoplasm. MRI without and with contrast is recommended for further characterization. There is no associated gland atrophy or ductal dilatation. Spleen: Normal in size without focal abnormality. Adrenals/Urinary Tract: The adrenal glands appear  unremarkable. There is a 2 cm left renal inferior pole hypodense lesion which is incompletely characterized but demonstrates fluid attenuation and likely represents a cyst. There is no hydronephrosis or nephrolithiasis on either side. The visualized ureters appear unremarkable. The urinary bladder is decompressed around a Foley catheter. Stomach/Bowel: Large amount of stool noted within the colon. There small scattered colonic diverticula without active inflammatory changes. There is no evidence of bowel obstruction or active inflammation. Normal appendix. Vascular/Lymphatic: Advanced aortoiliac atherosclerotic disease. There is a 3 cm infrarenal abdominal aortic aneurysm. No portal venous gas identified. There is no adenopathy. Reproductive: The prostate and seminal vesicles are grossly unremarkable. Other: There is ulceration of the skin over the distal sacrum with induration of the subcutaneous fat as well as pockets of air. No drainable fluid collection or abscess identified. The soft tissue air extends into the tissues abutting the sacral bone. There is no erosive changes or evidence of osteomyelitis by CT. Musculoskeletal: There is degenerative changes of the spine. Old right rib fractures. No acute fracture. IMPRESSION: Sacral ulcer with small pockets of air in the soft tissues superficial  to the distal sacral bone. No drainable fluid collection/ abscess identified. No evidence of osteomyelitis by CT. Irregular hepatic contour most compatible with cirrhosis. Multiple hepatic hypodense lesions are not characterized on the CT. Cystic lesion arising from the uncinate process of the pancreas. MRI without and with contrast is recommended for further evaluation of the hepatic and pancreatic lesions. Constipation. No bowel obstruction or active inflammation. Normal appendix. A 3 cm infrarenal abdominal aortic aneurysm. Recommend followup by ultrasound in 3 years. This recommendation follows ACR consensus guidelines:  White Paper of the ACR Incidental Findings Committee II on Vascular Findings. J Am Coll Radiol 2013; 59:458-592 Electronically Signed   By: Anner Crete M.D.   On: 12/30/2016 03:35   Dg Chest Port 1 View  Result Date: 01/20/2017 CLINICAL DATA:  Shortness of breath. Follow-up abnormal chest radiograph. EXAM: PORTABLE CHEST 1 VIEW COMPARISON:  01/19/2017.  12/29/2016. FINDINGS: Markedly enlarged cardiac silhouette again demonstrated. This could be due to cardiomegaly and/or pericardial fluid. Aortic atherosclerosis again demonstrated. There is pulmonary venous hypertension without frank edema. No consolidation or collapse. No visible effusions. Probably slightly improved since yesterday. IMPRESSION: Probably improved since yesterday. Cardiomegaly. Less venous hypertension. Electronically Signed   By: Nelson Chimes M.D.   On: 01/20/2017 09:20   Dg Chest Port 1 View  Result Date: 01/19/2017 CLINICAL DATA:  Fever. EXAM: PORTABLE CHEST 1 VIEW COMPARISON:  Radiographs of December 29, 2016. FINDINGS: Stable cardiomegaly. No pneumothorax or pleural effusion is noted. Increased central pulmonary vascular congestion and bilateral interstitial densities are noted concerning for pulmonary edema or possibly interstitial inflammation. Bony thorax is unremarkable. IMPRESSION: Increased central pulmonary vascular congestion is noted with increased bilateral interstitial densities concerning for pulmonary edema or possibly interstitial inflammation. Electronically Signed   By: Marijo Conception, M.D.   On: 01/19/2017 10:20        Filed Weights   01/19/17 2305 01/20/17 2210 01/21/17 2221  Weight: 131.5 kg (290 lb) 132.5 kg (292 lb 1.6 oz) (!) 136.2 kg (300 lb 3.2 oz)     Microbiology: No results found for this or any previous visit (from the past 240 hour(s)).     Blood Culture    Component Value Date/Time   SDES BLOOD RIGHT HAND 12/29/2016 2059   SPECREQUEST IN PEDIATRIC BOTTLE 4CC 12/29/2016 2059   CULT  NO GROWTH 5 DAYS 12/29/2016 2059   REPTSTATUS 01/03/2017 FINAL 12/29/2016 2059      Labs: Results for orders placed or performed during the hospital encounter of 12/29/16 (from the past 48 hour(s))  CBC     Status: Abnormal   Collection Time: 01/22/17  5:38 AM  Result Value Ref Range   WBC 6.2 4.0 - 10.5 K/uL   RBC 3.02 (L) 4.22 - 5.81 MIL/uL   Hemoglobin 8.4 (L) 13.0 - 17.0 g/dL   HCT 26.9 (L) 39.0 - 52.0 %   MCV 89.1 78.0 - 100.0 fL   MCH 27.8 26.0 - 34.0 pg   MCHC 31.2 30.0 - 36.0 g/dL   RDW 18.0 (H) 11.5 - 15.5 %   Platelets 241 150 - 400 K/uL  CBC     Status: Abnormal   Collection Time: 01/23/17  8:16 AM  Result Value Ref Range   WBC 6.6 4.0 - 10.5 K/uL   RBC 3.01 (L) 4.22 - 5.81 MIL/uL   Hemoglobin 8.4 (L) 13.0 - 17.0 g/dL   HCT 26.9 (L) 39.0 - 52.0 %   MCV 89.4 78.0 - 100.0 fL   MCH  27.9 26.0 - 34.0 pg   MCHC 31.2 30.0 - 36.0 g/dL   RDW 18.1 (H) 11.5 - 15.5 %   Platelets 227 150 - 400 K/uL     Lipid Panel     Component Value Date/Time   CHOL 156 04/02/2015 1244   TRIG 145 04/02/2015 1244   HDL 29 (L) 04/02/2015 1244   CHOLHDL 5.4 04/02/2015 1244   VLDL 29 04/02/2015 1244   [place] 98 04/02/2015 1244   LDLDIRECT 138.2 09/10/2012 1138     Lab Results  Component Value Date   HGBA1C 6.8 (H) 06/02/2012        HPI    Jasman Murri Prevetteis a 71 y.o.malewith medical history significant for A.Fib, HLD, HTN, sacral decubitus ulcer. Patient presented to the ED via EMS from [facility] with c/o confusion and elevated WBC on routine labs. Concern was for infection in the sacral decubitus. Patient was hospitalized for management. Patient was placed on broad-spectrum antibiotics. Anticoagulation was held. Patient underwent surgical debridement on 1/26.Patient also noted to have hepatic and pancreatic lesions noted incidentally on CT scan. Palliative care was consulted. Patient remains encephalopathic. Family elected to pursue treatment for now. He is  now DO NOT RESUSCITATE. He again had some bleeding from around his wound. Anticoagulation was held. Patient developed fever on 2/11. Chest x-ray showed either fluid overload or infection. Patient started on Unasyn due to concern for aspiration. Also given Lasix.  HOSPITAL COURSE:   Infected Decubitus ulcer of sacral region -Patient was found to have large sacral decubitus ulcer, unstageable with concerns for infection.  -CT scan did show some air pockets without any evidence for abscess. Started on vancomycin and Zosyn. Wound care was consulted. Subsequently, general surgery was consulted. Patient's anticoagulation was held.  S/p debridement1/26 Dr. Rosendo Gros. Patient was placed on vancomycin and Zosyn. He completed a course of these antibiotics. -Patient received hydrotherapy.   -Needs to improve his nutritional status to heal the wounds. -Palliative medicine met with the family, DNR for now but they want to continue to treat and see how he does. -Patient subsequently started bleeding around the wound area. Required blood transfusion. Anticoagulation  stopped temporarily but resumed 2/15 due to hx of a fib, high risk for DVT/PE . Bleeding appears to have slowed down some. As per surgery Wound appears to be improved with   hydrotherapy. Ok to stop hydro after 2/15  and return to wound vac with 3x weekly changes. Wound vac instructions as above    Fever with abnormal chest x-ray  Noted to have a fever on the night of 2/11. Now afebrile for 48 hours. Chest x-ray showed bilateral infiltrates. He was febrile. There was concern for aspiration. Patient was started on Unasyn. Chest x-ray repeated  and shows improvement. This is more in line with pulmonary edema rather than infection. Patient seems to be asymptomatic.  Continue Augmentin  3 more days   Acute encephalopathy in the setting of questionable dementia, completely resolved Some of this is due to baseline cognitive impairment. According  to patient's son, patient has had issues with memory for a few months. Patient was noted to be on Aricept a few months ago. Currently on Depakote and Seroquel. Mental status has been stable. Workup done here included B-12 level which was normal. TSH normal. RPR and HIV are nonreactive. EEG did not show any epileptiform activity. MRI brain was limited due to motion, however, did not show any acute findings. Atrophy was present. Continue thiamine  considering history of alcohol use in the past. Depakote level was nontoxic. Patient's verbalization of wanting to hurt himself, was most likely secondary to pain. Seen by psychiatry and cleared. Mental status back to baseline   A.Fib  -CHA2DS2-VASc score is 4. Rate is controlled with Coreg. Patient was on Eliquis,which was held for surgery. - It was resumed. However, patient noted to have a lot of bleeding from around his wound. His hemoglobin dropped and he required blood transfusion. His anticoagulation was held again, his wounds do seem to be healing. He is at increased risk of CVA/PE/stroke. Will resume anticoagulation and that the patient starts actively bleeding or hemoglobin drops again, consider stopping it permanently.   Acute blood loss anemia -He was transfused 2 units of blood on 2/8. Hemoglobin responded only partially. Note of blood noted in the wound VAC. Surgical PA examined the wound and found a lot of bleeding from surrounding desquamated area. Anticoagulation was contributing to this. This was stopped. Patient transfused one more unit of blood on 2/12.Hemoglobin is now 9.0>8.4>8.4 stable   Likely mild AKI on CKD stage 3/hypernatremia/hypokalemia -Creatinine baseline 1.4, presented with creatinine 1.6 very close to baseline. Patient had good response to IV Lasix yesterday. Creatinine improved this morning. Sodium level is normal. Potassium  repleted.  Acute on Chronic diastolic CHF  Patient's chest x-ray from 2/showed bilateral  infiltrates. Concern was for infection versus pulmonary edema. Patient appears to have responded to Lasix. X-ray from today shows improvement. So, this is more in line with pulmonary edema.  He was   restarted on torsemide.   Patient was on spironolactone at home, which was held at admission due to renal insufficiency.follow electrolytes and renal fn closely  Hepatic and pancreatic lesions -Cystic lesions incidentally noted on CT scan.  -MRI done and showed possible liver cirrhosis, pancreatic lesion does not look malignant, likely benign versus pseudocyst. Considering his overall poor health and possible dementia, he does not merit further evaluation at this time. Due to poor quality of MRI obtained during this hospitalization, a repeat MRI could be considered in 6 months if patient is able to better cooperate at that time.  Chronic venous insufficiency  Stable. Wound care. Per nursing staff, patient's oral intake has been good.  Goals of care Patient also noted to have generalized deconditioning. His quality of life appears to be poor. His activity level and functional status is not known, but likely poor considering presence of decubitus ulcer. Palliative medicine has seen the patient and discussed with family. Now DO NOT RESUSCITATE.       Discharge Exam: *  Blood pressure (!) 150/58, pulse 69, temperature 98.7 F (37.1 C), temperature source Oral, resp. rate 18, height _0  (1.778 m), weight (!) 136.2 kg (300 lb 3.2 oz), SpO2 95 %.  General appearance: alert, distracted and no distress Resp: Coarse breath sounds bilaterally. No crackles heard. Few wheezes. No rhonchi. Cardio: regular rate and rhythm, S1, S2 normal, no murmur, click, rub or gallop GI: soft, non-tender; bowel sounds normal; no masses,  no organomegaly Extremities: venous stasis dermatitis. Both legs are covered with dressing. Pulses: 2+ and symmetric Neurologic: Remains distracted and confused. No focal  deficits.    Follow-up Information    [name],[name], [name]. Schedule an appointment as soon as possible for a visit in 3 week(s).   Specialty:  Physician Assistant Contact information: [address], [address] [place] [address]        [facility]              Follow up.   Contact information: [address] [phone]          Signed: Reyne Dumas 01/23/2017, 10:46 AM        Time spent >45 mins

## 2017-01-23 NOTE — Progress Notes (Signed)
Patient ID: Jesus [name], male   DOB: Apr 09, 1946, 71 y.o.   MRN: [id number]  No further surgical intervention needed. Continue with wound vac. Recommend follow-up with wound care. General surgery will sign off, please call with concerns.  Gustin Zobrist A MILLER

## 2017-01-23 NOTE — Progress Notes (Signed)
PT Cancellation Note  Patient Details Name: Jesus [name] MRN: [id number] DOB: 09/15/1946   Cancelled Treatment:    Reason Eval/Treat Not Completed: Other (comment);Fatigue/lethargy limiting ability to participate (refused due to fatigue and fear of moving).  Agreed to try next visit, and will ck later as time and pt allow.   Ivar DrapeRuth E Giani Betzold 01/23/2017, 11:01 AM   Samul Dadauth Shanitra Phillippi, PT MS Acute Rehab Dept. Number: [facility] [phone] and [facility] [phone]

## 2017-01-23 NOTE — Care Management Important Message (Signed)
Important Message  Patient Details  Name: Jesus MooresDouglas M Mcguinn MRN: [id number] Date of Birth: 26-Jul-1946   Medicare Important Message Given:  Yes    Gerson Fauth, Annamarie MajorCheryl U, RN 01/23/2017, 11:33 AM

## 2017-01-23 NOTE — Consult Note (Signed)
WOC Nurse wound follow up Wound type: Stage 4 Pressure injury with surgical debridement and hydrotherapy Measurement: per PT notes 01/22/17 during hydrotherapy session; 15.2cm x 17.4cm x 6.3cm with undermining 5 cm at 10:00, 3.2 cm at 4:00, 5.2 cm at 9:00, 3.6 cm at 7:00 Wound bed: 75% early pink granulation tissue, 25% yellow fibrinous  Drainage (amount, consistency, odor) heavy, serosanguinous  Periwound: some skin stripping from tape (MARSI) but no new blistering noted today Dressing procedure/placement/frequency: Discussed patients wound yesterday with PT who has been performing hydrotherapy and with surgical team.  Planned replacement of the NPWT VAC dressing today. 1.  Strip of VAC drape used to protect skin to the left hip. 2.  1pc of black foam used to create bridge on this protected skin to the left hip 3. 3 pc of black foam used to fill the wound bed and cover the open wound edges. 4. 3 pc of ostomy barrier ring used at the distal edge of the wound above the anus to aid in seal of this deep moist area. 5. Sealed at 125mmHG.  Patient tolerated well, received pain meds prior to dressing change.  Continue low air loss mattress, MUST HAVE THIS AT DC FOR WOUND HEALING AND PRESSURE REDISTRIBUTION Maximize nutrition for wound healing Consider MVI, zinc and Vit. C for wound healing.  If patient mobilized to a chair, must have offloading cushion in the chair. Should not be up in a chair more than 1 hour per day.  WOC team will follow along with you for continued support with wound care Ruben Mahler [facility] MSN, RN,CWOCN, CNS [phone]

## 2017-01-24 NOTE — Progress Notes (Signed)
Discharge pt to [facility] skilled nursing via stretcher. Pt alert and oriented to self only at baseline. No complaints. No distress noted. AVS printed and sent with pt. Vss.

## 2017-01-30 LAB — CBC AND DIFFERENTIAL
HCT: 23 % — AB (ref 41–53)
HEMOGLOBIN: 7.8 g/dL — AB (ref 13.5–17.5)
Platelets: 270 10*3/uL (ref 150–399)
WBC: 5.8 10^3/mL

## 2017-02-05 DIAGNOSIS — J9601 Acute respiratory failure with hypoxia: Secondary | ICD-10-CM

## 2017-02-05 DIAGNOSIS — J9602 Acute respiratory failure with hypercapnia: Secondary | ICD-10-CM

## 2017-02-05 HISTORY — DX: Acute respiratory failure with hypoxia: J96.01

## 2017-02-05 HISTORY — DX: Acute respiratory failure with hypercapnia: J96.02

## 2017-02-06 LAB — BASIC METABOLIC PANEL
BUN: 21 mg/dL (ref 4–21)
CREATININE: 0.9 mg/dL (ref 0.6–1.3)
Glucose: 77 mg/dL
POTASSIUM: 4.1 mmol/L (ref 3.4–5.3)
Sodium: 139 mmol/L (ref 137–147)

## 2017-02-06 LAB — CBC AND DIFFERENTIAL
HCT: 23 % — AB (ref 41–53)
Hemoglobin: 7.9 g/dL — AB (ref 13.5–17.5)
Platelets: 280 10*3/uL (ref 150–399)
WBC: 5.6 10*3/mL

## 2017-02-07 DIAGNOSIS — R410 Disorientation, unspecified: Secondary | ICD-10-CM | POA: Diagnosis not present

## 2017-02-07 DIAGNOSIS — I482 Chronic atrial fibrillation: Secondary | ICD-10-CM | POA: Diagnosis not present

## 2017-02-07 DIAGNOSIS — K7030 Alcoholic cirrhosis of liver without ascites: Secondary | ICD-10-CM | POA: Diagnosis not present

## 2017-02-08 DIAGNOSIS — I482 Chronic atrial fibrillation: Secondary | ICD-10-CM | POA: Diagnosis not present

## 2017-02-08 DIAGNOSIS — K7030 Alcoholic cirrhosis of liver without ascites: Secondary | ICD-10-CM | POA: Diagnosis not present

## 2017-02-08 DIAGNOSIS — R410 Disorientation, unspecified: Secondary | ICD-10-CM | POA: Diagnosis not present

## 2017-02-11 ENCOUNTER — Encounter (HOSPITAL_BASED_OUTPATIENT_CLINIC_OR_DEPARTMENT_OTHER): Payer: Medicare HMO | Attending: Surgery

## 2017-02-13 DIAGNOSIS — R0902 Hypoxemia: Secondary | ICD-10-CM | POA: Diagnosis not present

## 2017-02-13 DIAGNOSIS — L03319 Cellulitis of trunk, unspecified: Secondary | ICD-10-CM | POA: Diagnosis not present

## 2017-02-13 DIAGNOSIS — F1027 Alcohol dependence with alcohol-induced persisting dementia: Secondary | ICD-10-CM | POA: Diagnosis not present

## 2017-02-13 DIAGNOSIS — F0391 Unspecified dementia with behavioral disturbance: Secondary | ICD-10-CM | POA: Diagnosis not present

## 2017-02-13 DIAGNOSIS — F39 Unspecified mood [affective] disorder: Secondary | ICD-10-CM | POA: Diagnosis not present

## 2017-02-13 DIAGNOSIS — L8992 Pressure ulcer of unspecified site, stage 2: Secondary | ICD-10-CM | POA: Diagnosis not present

## 2017-02-13 DIAGNOSIS — A419 Sepsis, unspecified organism: Secondary | ICD-10-CM | POA: Diagnosis not present

## 2017-02-13 DIAGNOSIS — R261 Paralytic gait: Secondary | ICD-10-CM | POA: Diagnosis not present

## 2017-02-13 DIAGNOSIS — I5033 Acute on chronic diastolic (congestive) heart failure: Secondary | ICD-10-CM | POA: Diagnosis not present

## 2017-02-13 DIAGNOSIS — E46 Unspecified protein-calorie malnutrition: Secondary | ICD-10-CM | POA: Diagnosis present

## 2017-02-13 DIAGNOSIS — Z5189 Encounter for other specified aftercare: Secondary | ICD-10-CM | POA: Diagnosis not present

## 2017-02-13 DIAGNOSIS — I87313 Chronic venous hypertension (idiopathic) with ulcer of bilateral lower extremity: Secondary | ICD-10-CM | POA: Diagnosis not present

## 2017-02-13 DIAGNOSIS — G8928 Other chronic postprocedural pain: Secondary | ICD-10-CM | POA: Diagnosis not present

## 2017-02-13 DIAGNOSIS — I5032 Chronic diastolic (congestive) heart failure: Secondary | ICD-10-CM | POA: Diagnosis not present

## 2017-02-13 DIAGNOSIS — J810 Acute pulmonary edema: Secondary | ICD-10-CM | POA: Diagnosis not present

## 2017-02-13 DIAGNOSIS — R918 Other nonspecific abnormal finding of lung field: Secondary | ICD-10-CM | POA: Diagnosis not present

## 2017-02-13 DIAGNOSIS — R2689 Other abnormalities of gait and mobility: Secondary | ICD-10-CM | POA: Diagnosis not present

## 2017-02-13 DIAGNOSIS — G9340 Encephalopathy, unspecified: Secondary | ICD-10-CM | POA: Diagnosis not present

## 2017-02-13 DIAGNOSIS — I4891 Unspecified atrial fibrillation: Secondary | ICD-10-CM | POA: Diagnosis not present

## 2017-02-13 DIAGNOSIS — N390 Urinary tract infection, site not specified: Secondary | ICD-10-CM | POA: Diagnosis not present

## 2017-02-13 DIAGNOSIS — L97909 Non-pressure chronic ulcer of unspecified part of unspecified lower leg with unspecified severity: Secondary | ICD-10-CM | POA: Diagnosis present

## 2017-02-13 DIAGNOSIS — M6281 Muscle weakness (generalized): Secondary | ICD-10-CM | POA: Diagnosis not present

## 2017-02-13 DIAGNOSIS — R443 Hallucinations, unspecified: Secondary | ICD-10-CM | POA: Diagnosis not present

## 2017-02-13 DIAGNOSIS — E43 Unspecified severe protein-calorie malnutrition: Secondary | ICD-10-CM | POA: Diagnosis not present

## 2017-02-13 DIAGNOSIS — K7460 Unspecified cirrhosis of liver: Secondary | ICD-10-CM | POA: Diagnosis present

## 2017-02-13 DIAGNOSIS — S31809A Unspecified open wound of unspecified buttock, initial encounter: Secondary | ICD-10-CM | POA: Diagnosis not present

## 2017-02-13 DIAGNOSIS — R578 Other shock: Secondary | ICD-10-CM | POA: Diagnosis not present

## 2017-02-13 DIAGNOSIS — K709 Alcoholic liver disease, unspecified: Secondary | ICD-10-CM | POA: Diagnosis not present

## 2017-02-13 DIAGNOSIS — L89890 Pressure ulcer of other site, unstageable: Secondary | ICD-10-CM | POA: Diagnosis not present

## 2017-02-13 DIAGNOSIS — G8929 Other chronic pain: Secondary | ICD-10-CM | POA: Diagnosis not present

## 2017-02-13 DIAGNOSIS — G9341 Metabolic encephalopathy: Secondary | ICD-10-CM | POA: Diagnosis not present

## 2017-02-13 DIAGNOSIS — N183 Chronic kidney disease, stage 3 (moderate): Secondary | ICD-10-CM | POA: Diagnosis not present

## 2017-02-13 DIAGNOSIS — R488 Other symbolic dysfunctions: Secondary | ICD-10-CM | POA: Diagnosis not present

## 2017-02-13 DIAGNOSIS — L97821 Non-pressure chronic ulcer of other part of left lower leg limited to breakdown of skin: Secondary | ICD-10-CM | POA: Diagnosis not present

## 2017-02-13 DIAGNOSIS — I739 Peripheral vascular disease, unspecified: Secondary | ICD-10-CM | POA: Diagnosis present

## 2017-02-13 DIAGNOSIS — J9621 Acute and chronic respiratory failure with hypoxia: Secondary | ICD-10-CM | POA: Diagnosis not present

## 2017-02-13 DIAGNOSIS — J9622 Acute and chronic respiratory failure with hypercapnia: Secondary | ICD-10-CM | POA: Diagnosis not present

## 2017-02-13 DIAGNOSIS — G894 Chronic pain syndrome: Secondary | ICD-10-CM | POA: Diagnosis not present

## 2017-02-13 DIAGNOSIS — D649 Anemia, unspecified: Secondary | ICD-10-CM | POA: Diagnosis not present

## 2017-02-13 DIAGNOSIS — L89159 Pressure ulcer of sacral region, unspecified stage: Secondary | ICD-10-CM | POA: Diagnosis not present

## 2017-02-13 DIAGNOSIS — L89154 Pressure ulcer of sacral region, stage 4: Secondary | ICD-10-CM | POA: Diagnosis not present

## 2017-02-13 DIAGNOSIS — R41841 Cognitive communication deficit: Secondary | ICD-10-CM | POA: Diagnosis not present

## 2017-02-13 DIAGNOSIS — L97321 Non-pressure chronic ulcer of left ankle limited to breakdown of skin: Secondary | ICD-10-CM | POA: Diagnosis not present

## 2017-02-13 DIAGNOSIS — J80 Acute respiratory distress syndrome: Secondary | ICD-10-CM | POA: Diagnosis not present

## 2017-02-13 DIAGNOSIS — R4182 Altered mental status, unspecified: Secondary | ICD-10-CM | POA: Diagnosis not present

## 2017-02-13 DIAGNOSIS — Y846 Urinary catheterization as the cause of abnormal reaction of the patient, or of later complication, without mention of misadventure at the time of the procedure: Secondary | ICD-10-CM | POA: Diagnosis present

## 2017-02-13 DIAGNOSIS — R600 Localized edema: Secondary | ICD-10-CM | POA: Diagnosis not present

## 2017-02-13 DIAGNOSIS — F339 Major depressive disorder, recurrent, unspecified: Secondary | ICD-10-CM | POA: Diagnosis not present

## 2017-02-13 DIAGNOSIS — Z7189 Other specified counseling: Secondary | ICD-10-CM | POA: Diagnosis not present

## 2017-02-13 DIAGNOSIS — L03312 Cellulitis of back [any part except buttock]: Secondary | ICD-10-CM | POA: Diagnosis present

## 2017-02-13 DIAGNOSIS — J690 Pneumonitis due to inhalation of food and vomit: Secondary | ICD-10-CM | POA: Diagnosis not present

## 2017-02-13 DIAGNOSIS — G8918 Other acute postprocedural pain: Secondary | ICD-10-CM | POA: Diagnosis not present

## 2017-02-13 DIAGNOSIS — L89610 Pressure ulcer of right heel, unstageable: Secondary | ICD-10-CM | POA: Diagnosis not present

## 2017-02-13 DIAGNOSIS — L89150 Pressure ulcer of sacral region, unstageable: Secondary | ICD-10-CM | POA: Diagnosis not present

## 2017-02-13 DIAGNOSIS — L03119 Cellulitis of unspecified part of limb: Secondary | ICD-10-CM | POA: Diagnosis not present

## 2017-02-13 DIAGNOSIS — R451 Restlessness and agitation: Secondary | ICD-10-CM | POA: Diagnosis not present

## 2017-02-13 DIAGNOSIS — I482 Chronic atrial fibrillation: Secondary | ICD-10-CM | POA: Diagnosis present

## 2017-02-13 DIAGNOSIS — I480 Paroxysmal atrial fibrillation: Secondary | ICD-10-CM | POA: Diagnosis not present

## 2017-02-13 DIAGNOSIS — J9601 Acute respiratory failure with hypoxia: Secondary | ICD-10-CM | POA: Diagnosis not present

## 2017-02-13 DIAGNOSIS — I872 Venous insufficiency (chronic) (peripheral): Secondary | ICD-10-CM | POA: Diagnosis not present

## 2017-02-13 DIAGNOSIS — L97311 Non-pressure chronic ulcer of right ankle limited to breakdown of skin: Secondary | ICD-10-CM | POA: Diagnosis not present

## 2017-02-13 DIAGNOSIS — L03317 Cellulitis of buttock: Secondary | ICD-10-CM | POA: Diagnosis not present

## 2017-02-13 DIAGNOSIS — F0280 Dementia in other diseases classified elsewhere without behavioral disturbance: Secondary | ICD-10-CM | POA: Diagnosis not present

## 2017-02-13 DIAGNOSIS — I890 Lymphedema, not elsewhere classified: Secondary | ICD-10-CM | POA: Diagnosis not present

## 2017-02-13 DIAGNOSIS — F29 Unspecified psychosis not due to a substance or known physiological condition: Secondary | ICD-10-CM | POA: Diagnosis not present

## 2017-02-13 DIAGNOSIS — R5381 Other malaise: Secondary | ICD-10-CM | POA: Diagnosis not present

## 2017-02-13 DIAGNOSIS — F1120 Opioid dependence, uncomplicated: Secondary | ICD-10-CM | POA: Diagnosis not present

## 2017-02-13 DIAGNOSIS — K219 Gastro-esophageal reflux disease without esophagitis: Secondary | ICD-10-CM | POA: Diagnosis not present

## 2017-02-13 DIAGNOSIS — R278 Other lack of coordination: Secondary | ICD-10-CM | POA: Diagnosis not present

## 2017-02-13 DIAGNOSIS — G4733 Obstructive sleep apnea (adult) (pediatric): Secondary | ICD-10-CM | POA: Diagnosis present

## 2017-02-13 DIAGNOSIS — R531 Weakness: Secondary | ICD-10-CM | POA: Diagnosis not present

## 2017-02-13 DIAGNOSIS — I130 Hypertensive heart and chronic kidney disease with heart failure and stage 1 through stage 4 chronic kidney disease, or unspecified chronic kidney disease: Secondary | ICD-10-CM | POA: Diagnosis not present

## 2017-02-13 DIAGNOSIS — Z515 Encounter for palliative care: Secondary | ICD-10-CM | POA: Diagnosis not present

## 2017-02-13 DIAGNOSIS — K5901 Slow transit constipation: Secondary | ICD-10-CM | POA: Diagnosis not present

## 2017-02-13 DIAGNOSIS — L97811 Non-pressure chronic ulcer of other part of right lower leg limited to breakdown of skin: Secondary | ICD-10-CM | POA: Diagnosis not present

## 2017-02-13 DIAGNOSIS — T83518A Infection and inflammatory reaction due to other urinary catheter, initial encounter: Secondary | ICD-10-CM | POA: Diagnosis not present

## 2017-02-13 DIAGNOSIS — J9602 Acute respiratory failure with hypercapnia: Secondary | ICD-10-CM | POA: Diagnosis not present

## 2017-02-13 DIAGNOSIS — Z87891 Personal history of nicotine dependence: Secondary | ICD-10-CM | POA: Diagnosis not present

## 2017-02-13 DIAGNOSIS — T83511A Infection and inflammatory reaction due to indwelling urethral catheter, initial encounter: Secondary | ICD-10-CM | POA: Diagnosis not present

## 2017-02-13 DIAGNOSIS — Z7901 Long term (current) use of anticoagulants: Secondary | ICD-10-CM | POA: Diagnosis not present

## 2017-02-13 DIAGNOSIS — D689 Coagulation defect, unspecified: Secondary | ICD-10-CM | POA: Diagnosis not present

## 2017-02-16 ENCOUNTER — Non-Acute Institutional Stay (SKILLED_NURSING_FACILITY): Payer: Commercial Managed Care - HMO | Admitting: Adult Health

## 2017-02-16 ENCOUNTER — Encounter: Payer: Self-pay | Admitting: Adult Health

## 2017-02-16 DIAGNOSIS — I87313 Chronic venous hypertension (idiopathic) with ulcer of bilateral lower extremity: Secondary | ICD-10-CM | POA: Diagnosis not present

## 2017-02-16 DIAGNOSIS — L89150 Pressure ulcer of sacral region, unstageable: Secondary | ICD-10-CM

## 2017-02-16 DIAGNOSIS — F29 Unspecified psychosis not due to a substance or known physiological condition: Secondary | ICD-10-CM | POA: Diagnosis not present

## 2017-02-16 DIAGNOSIS — I480 Paroxysmal atrial fibrillation: Secondary | ICD-10-CM

## 2017-02-16 DIAGNOSIS — L97321 Non-pressure chronic ulcer of left ankle limited to breakdown of skin: Secondary | ICD-10-CM | POA: Diagnosis not present

## 2017-02-16 DIAGNOSIS — R531 Weakness: Secondary | ICD-10-CM

## 2017-02-16 DIAGNOSIS — L97821 Non-pressure chronic ulcer of other part of left lower leg limited to breakdown of skin: Secondary | ICD-10-CM | POA: Diagnosis not present

## 2017-02-16 DIAGNOSIS — F39 Unspecified mood [affective] disorder: Secondary | ICD-10-CM

## 2017-02-16 DIAGNOSIS — G894 Chronic pain syndrome: Secondary | ICD-10-CM | POA: Diagnosis not present

## 2017-02-16 DIAGNOSIS — F0391 Unspecified dementia with behavioral disturbance: Secondary | ICD-10-CM | POA: Diagnosis not present

## 2017-02-16 DIAGNOSIS — L89890 Pressure ulcer of other site, unstageable: Secondary | ICD-10-CM | POA: Diagnosis not present

## 2017-02-16 DIAGNOSIS — K5901 Slow transit constipation: Secondary | ICD-10-CM

## 2017-02-16 DIAGNOSIS — L97311 Non-pressure chronic ulcer of right ankle limited to breakdown of skin: Secondary | ICD-10-CM | POA: Diagnosis not present

## 2017-02-16 DIAGNOSIS — L97811 Non-pressure chronic ulcer of other part of right lower leg limited to breakdown of skin: Secondary | ICD-10-CM | POA: Diagnosis not present

## 2017-02-16 DIAGNOSIS — L89610 Pressure ulcer of right heel, unstageable: Secondary | ICD-10-CM | POA: Diagnosis not present

## 2017-02-16 DIAGNOSIS — L89154 Pressure ulcer of sacral region, stage 4: Secondary | ICD-10-CM | POA: Diagnosis not present

## 2017-02-17 ENCOUNTER — Encounter: Payer: Self-pay | Admitting: Internal Medicine

## 2017-02-17 ENCOUNTER — Non-Acute Institutional Stay (SKILLED_NURSING_FACILITY): Payer: Commercial Managed Care - HMO | Admitting: Internal Medicine

## 2017-02-17 ENCOUNTER — Encounter: Payer: Self-pay | Admitting: Adult Health

## 2017-02-17 ENCOUNTER — Emergency Department (HOSPITAL_COMMUNITY)
Admission: EM | Admit: 2017-02-17 | Discharge: 2017-02-18 | Disposition: A | Discharge status: Home / Self Care | Payer: Medicare HMO | Attending: Emergency Medicine | Admitting: Emergency Medicine

## 2017-02-17 DIAGNOSIS — J690 Pneumonitis due to inhalation of food and vomit: Secondary | ICD-10-CM

## 2017-02-17 DIAGNOSIS — L89150 Pressure ulcer of sacral region, unstageable: Secondary | ICD-10-CM

## 2017-02-17 DIAGNOSIS — N183 Chronic kidney disease, stage 3 (moderate): Secondary | ICD-10-CM | POA: Insufficient documentation

## 2017-02-17 DIAGNOSIS — I872 Venous insufficiency (chronic) (peripheral): Secondary | ICD-10-CM

## 2017-02-17 DIAGNOSIS — R5381 Other malaise: Secondary | ICD-10-CM

## 2017-02-17 DIAGNOSIS — F0391 Unspecified dementia with behavioral disturbance: Secondary | ICD-10-CM

## 2017-02-17 DIAGNOSIS — I5033 Acute on chronic diastolic (congestive) heart failure: Secondary | ICD-10-CM | POA: Insufficient documentation

## 2017-02-17 DIAGNOSIS — D638 Anemia in other chronic diseases classified elsewhere: Secondary | ICD-10-CM

## 2017-02-17 DIAGNOSIS — I4891 Unspecified atrial fibrillation: Secondary | ICD-10-CM | POA: Diagnosis not present

## 2017-02-17 DIAGNOSIS — I5032 Chronic diastolic (congestive) heart failure: Secondary | ICD-10-CM

## 2017-02-17 DIAGNOSIS — G8929 Other chronic pain: Secondary | ICD-10-CM | POA: Diagnosis not present

## 2017-02-17 DIAGNOSIS — D649 Anemia, unspecified: Secondary | ICD-10-CM | POA: Insufficient documentation

## 2017-02-17 DIAGNOSIS — E43 Unspecified severe protein-calorie malnutrition: Secondary | ICD-10-CM

## 2017-02-17 DIAGNOSIS — Z87891 Personal history of nicotine dependence: Secondary | ICD-10-CM | POA: Diagnosis not present

## 2017-02-17 DIAGNOSIS — I130 Hypertensive heart and chronic kidney disease with heart failure and stage 1 through stage 4 chronic kidney disease, or unspecified chronic kidney disease: Secondary | ICD-10-CM | POA: Diagnosis not present

## 2017-02-17 DIAGNOSIS — R451 Restlessness and agitation: Secondary | ICD-10-CM | POA: Diagnosis not present

## 2017-02-17 DIAGNOSIS — Z7901 Long term (current) use of anticoagulants: Secondary | ICD-10-CM | POA: Insufficient documentation

## 2017-02-17 DIAGNOSIS — K219 Gastro-esophageal reflux disease without esophagitis: Secondary | ICD-10-CM | POA: Diagnosis not present

## 2017-02-17 LAB — BASIC METABOLIC PANEL
Anion gap: 10 (ref 5–15)
BUN: 19 mg/dL (ref 6–20)
CALCIUM: 8.3 mg/dL — AB (ref 8.9–10.3)
CO2: 30 mmol/L (ref 22–32)
CREATININE: 1.16 mg/dL (ref 0.61–1.24)
Chloride: 94 mmol/L — ABNORMAL LOW (ref 101–111)
GFR calc Af Amer: >60 mL/min (ref >60)
Glucose, Bld: 125 mg/dL — ABNORMAL HIGH (ref 65–99)
Potassium: 3.4 mmol/L — ABNORMAL LOW (ref 3.5–5.1)
SODIUM: 134 mmol/L — AB (ref 135–145)

## 2017-02-17 LAB — CBC WITH DIFFERENTIAL/PLATELET
BASOS PCT: 0 %
Basophils Absolute: 0 10*3/uL (ref 0.0–0.1)
EOS ABS: 0.6 10*3/uL (ref 0.0–0.7)
EOS PCT: 7 %
HCT: 21.8 % — ABNORMAL LOW (ref 39.0–52.0)
Hemoglobin: 6.9 g/dL — CL (ref 13.0–17.0)
LYMPHS ABS: 0.9 10*3/uL (ref 0.7–4.0)
Lymphocytes Relative: 10 %
MCH: 29.5 pg (ref 26.0–34.0)
MCHC: 31.7 g/dL (ref 30.0–36.0)
MCV: 93.2 fL (ref 78.0–100.0)
MONO ABS: 0.8 10*3/uL (ref 0.1–1.0)
MONOS PCT: 10 %
Neutro Abs: 6.2 10*3/uL (ref 1.7–7.7)
Neutrophils Relative %: 73 %
PLATELETS: 303 10*3/uL (ref 150–400)
RBC: 2.34 MIL/uL — ABNORMAL LOW (ref 4.22–5.81)
RDW: 19.4 % — AB (ref 11.5–15.5)
WBC: 8.4 10*3/uL (ref 4.0–10.5)

## 2017-02-17 LAB — PROTIME-INR
INR: 2.09
PROTHROMBIN TIME: 23.8 s — AB (ref 11.4–15.2)

## 2017-02-17 LAB — PREPARE RBC (CROSSMATCH)

## 2017-02-17 LAB — POC OCCULT BLOOD, ED: FECAL OCCULT BLD: NEGATIVE

## 2017-02-17 MED ORDER — MORPHINE SULFATE (PF) 4 MG/ML IV SOLN
8.0000 mg | Freq: Once | INTRAVENOUS | Status: DC
  Filled 2017-02-17: qty 2

## 2017-02-17 MED ORDER — SODIUM CHLORIDE 0.9 % IV SOLN
10.0000 mL/h | Freq: Once | INTRAVENOUS | Status: AC
  Administered 2017-02-17: 10 mL/h via INTRAVENOUS

## 2017-02-17 MED ORDER — HALOPERIDOL LACTATE 5 MG/ML IJ SOLN: 2.0000 mg | Freq: Once | INTRAMUSCULAR | Status: DC

## 2017-02-17 MED ORDER — HALOPERIDOL LACTATE 5 MG/ML IJ SOLN
5.0000 mg | Freq: Once | INTRAMUSCULAR | Status: AC
  Administered 2017-02-17: 5 mg via INTRAMUSCULAR
  Filled 2017-02-17: qty 1

## 2017-02-17 NOTE — Progress Notes (Signed)
LOCATION: [facility]  PCP: [name],[name], [name]   Code Status: DNR  Goals of care: Advanced Directive information Advanced Directives 02/17/2017  Does Patient Have a Medical Advance Directive? Yes  Type of Advance Directive Out of facility DNR (pink MOST or yellow form)  Does patient want to make changes to medical advance directive? No - Patient declined  Copy of Healthcare Power of Attorney in Chart? -  Would patient like information on creating a medical advance directive? -       Extended Emergency Contact Information Primary Emergency Contact: [facility] DtDeforge,[name] Address: [address]           [address]          [place], [place] [id number] [place] Mobile Phone: [phone] Relation: Daughter Secondary Emergency Contact: Khader,[name]  [place] Mobile Phone: [phone] Relation: Son   Allergies  Allergen Reactions  . Amlodipine Besylate Swelling and Other (See Comments)    "started off low and ended up severe; if, in fact, that is what's causing the swelling" (06/03/12)  . Levaquin [Levofloxacin In D5w] Rash    Chief Complaint  Patient presents with  . New Admit To SNF    New Admission Visit      HPI:  Patient is a 71 y.o. male seen today for short term rehabilitation post hospital re-admission from01/22/2018-01/23/2017 and [facility] from 01/23/2017-02/12/2017. [facility] leukocytosis and change in mental state. CT scan of the pelvic area showed air pockets without abscess in the sacral region. He was started on broad-spectrum antibiotic and his anticoagulation was held. following this he underwent surgical debridement of sacral wound on 01/02/2017. He received hydrotherapy and wound care with wound VAC placement. He started bleeding from the wound and required blood transfusion. He was noted to have hepatic and pancreatic lesions on CAT scan of abdomen. Palliative care was  consulted and plan is for complete care. He was also treated for aspiration pneumonia this hospital admission. His encephalopathy improved post treatment of infection. He has medical history of atrial fibrillation, hypertension, hyperlipidemia, liver cirrhosis, chronic venous insufficiency among others.  Review of Systems:  Constitutional: Negative for fever, chills.  HENT: Negative for headache, congestion, nasal discharge, difficulty swallowing.   Eyes: Negative for double vision and discharge.  Respiratory: Negative for cough, shortness of breath and wheezing.   Cardiovascular: Negative for chest pain, palpitation.  Gastrointestinal: Negative for heartburn, vomiting, abdominal pain. Positive for nausea and poor appetite. Genitourinary: Negative for dysuria.  Musculoskeletal: Negative for fall in the facility.  positive for chronic pain. Skin: Negative for itching, rash.  Neurological: Negative for dizziness. Psychiatric/Behavioral: Negative for depression   Past Medical History:  Diagnosis Date  . Acute on chronic renal failure (HCC)   . Arthritis    "normal for my age" (11/13/2016)  . Cellulitis and abscess of leg 11/2016  . Chronic anemia   . Chronic atrial fibrillation (HCC)   . Chronic diastolic CHF (congestive heart failure) (HCC) 12/2002   a. EF 50-55% by echo 04/2012  . Decubitus ulcer of buttock, unstageable (HCC) 12/31/2016  . Edema of lower extremity   . Gout   . H/O: GI bleed    "cause I took too much aspirin"  . High cholesterol   . Hyperglycemia    Noted 05/2012  . Hypertension   . Morbid obesity (HCC)   . PVD (peripheral vascular disease) (HCC)   . Venous stasis  ulcer (HCC)    Past Surgical History:  Procedure Laterality Date  . DEBRIDMENT OF DECUBITUS ULCER N/A 01/02/2017   Procedure: DEBRIDMENT OF DECUBITUS ULCER;  Surgeon: Axel Filler, MD;  Location: [facility] OR;  Service: General;  Laterality: N/A;  . NO PAST SURGERIES     Social History:   reports that he  quit smoking about 51 years ago. His smoking use included Cigarettes. He has never used smokeless tobacco. He reports that he drinks about 8.4 oz of alcohol per week . He reports that he does not use drugs.  Family History  Problem Relation Age of Onset  . Arrhythmia Father   . Hypertension Father   . Cancer Mother     cervical    Medications: Allergies as of 02/17/2017      Reactions   Amlodipine Besylate Swelling, Other (See Comments)   "started off low and ended up severe; if, in fact, that is what's causing the swelling" (06/03/12)   Levaquin [levofloxacin In D5w] Rash      Medication List       Accurate as of 02/17/17 11:38 AM. Always use your most recent med list.          acetaminophen 500 MG tablet Commonly known as:  TYLENOL Take 500 mg by mouth every 8 (eight) hours as needed for moderate pain (Pain score 4-6/10).   apixaban 5 MG Tabs tablet Commonly known as:  ELIQUIS Take 1 tablet (5 mg total) by mouth 2 (two) times daily.   atorvastatin 80 MG tablet Commonly known as:  LIPITOR Take 1 tablet (80 mg total) by mouth daily.   carvedilol 12.5 MG tablet Commonly known as:  COREG Take 12.5 mg by mouth 2 (two) times daily with a meal.   DECUBI-VITE PO Take 1 capsule by mouth daily.   diphenhydrAMINE 25 MG tablet Commonly known as:  BENADRYL Take 25 mg by mouth every 6 (six) hours as needed for allergies.   divalproex 125 MG capsule Commonly known as:  DEPAKOTE SPRINKLE Take 125 mg by mouth at bedtime.   divalproex 250 MG 24 hr tablet Commonly known as:  DEPAKOTE ER Take 250 mg by mouth daily.   famotidine 20 MG tablet Commonly known as:  PEPCID Take 20 mg by mouth 2 (two) times daily.   feeding supplement (PRO-STAT SUGAR FREE 64) Liqd Take 30 mLs by mouth every 6 (six) hours.   HYDROcodone-acetaminophen 5-325 MG tablet Commonly known as:  NORCO/VICODIN Take 1 tablet by mouth every 6 (six) hours as needed for moderate pain.   HYDROmorphone 2 MG  tablet Commonly known as:  DILAUDID Take 2 mg by mouth daily as needed for severe pain. For dressing changes   lactulose 10 GM/15ML solution Commonly known as:  CHRONULAC Take by mouth every other day. Give 30 mL   memantine 5 MG tablet Commonly known as:  NAMENDA Take 5 mg by mouth 2 (two) times daily.   mirtazapine 7.5 MG tablet Commonly known as:  REMERON Take 7.5 mg by mouth at bedtime.   morphine 15 MG tablet Commonly known as:  MSIR Take 15 mg by mouth at bedtime.   NON FORMULARY Take 240 mLs by mouth 3 (three) times daily. Med pass   saccharomyces boulardii 250 MG capsule Commonly known as:  FLORASTOR Take 250 mg by mouth 2 (two) times daily.   thiamine 100 MG tablet Take 1 tablet (100 mg total) by mouth daily.   torsemide 20 MG tablet Commonly known as:  DEMADEX Take 1 tablet (20 mg total) by mouth daily.   UNABLE TO FIND Med Name: A and D ointment. Apply 2 times daily to the affected area.   UNABLE TO FIND Med Name: Magic cup daily with meals       Immunizations:  There is no immunization history on file for this patient.   Physical Exam: Vitals:   02/17/17 1119  BP: 139/72  Pulse: 69  Resp: 20  Temp: 99.2 F (37.3 C)  TempSrc: Oral  SpO2: 98%  Height: 5\' 10"  (1.778 m)   Body mass index is 43.07 kg/m.  General- elderly Male, chronically ill-appearing, in no acute distress Head- normocephalic, atraumatic Nose- no nasal discharge Throat- moist mucus membrane Eyes- PERRLA, EOMI, no pallor, no icterus, no discharge, normal conjunctiva, normal sclera Neck- no cervical lymphadenopathy Cardiovascular- normal s1,s2, no murmur Respiratory- bilateral clear to auscultation, no wheeze, no rhonchi, no crackles, no use of accessory muscles Abdomen- bowel sounds present, soft, non tender, no guarding or rigidity, has rectal tube with stool in bag and Foley catheter with urine in bag Musculoskeletal- able to move all 4 extremities, generalized weakness  more prominent to his lower extremities Neurological- alert and oriented to person and place but not to time Skin- warm and dry Psychiatry- normal mood and affect    Labs reviewed: Basic Metabolic Panel:  Recent Labs  16/10/96 1050  01/19/17 0524 01/20/17 0746 01/21/17 0520 02/06/17  NA 130*  < > 146* 142 139 139  K 4.7  < > 3.8 3.2* 3.7 4.1  CL 91*  < > 100* 96* 94*  --   CO2 29  < > 35* 34* 34*  --   GLUCOSE 111*  < > 109* 154* 115*  --   BUN 44*  < > 38* 38* 30* 21  CREATININE 1.83*  < > 1.34* 1.31* 1.19 0.9  CALCIUM 9.0  < > 8.8* 8.5* 8.4*  --   PHOS 3.5  --   --   --   --   --   < > = values in this interval not displayed. Liver Function Tests:  Recent Labs  11/24/16 1110 12/04/16 12/29/16 1916 01/01/17 0518  AST 23 26 31 23   ALT 19 25 23 20   ALKPHOS 181* 289* 200* 171*  BILITOT 1.4*  --  1.5* 1.5*  PROT 7.1  --  6.4* 6.3*  ALBUMIN 3.2*  --  2.3* 1.9*    Recent Labs  12/29/16 1916  LIPASE 27   No results for input(s): AMMONIA in the last 8760 hours. CBC:  Recent Labs  11/28/16 0627 12/04/16 12/29/16 1916  01/21/17 0520 01/22/17 0538 01/23/17 0816 01/30/17 02/06/17  WBC 9.4 9.1 13.8*  < > 6.4 6.2 6.6 5.8 5.6  NEUTROABS 7.5 7 11.6*  --   --   --   --   --   --   HGB 11.7* 11.0* 11.7*  < > 9.0* 8.4* 8.4* 7.8* 7.9*  HCT 37.0* 34* 35.8*  < > 28.7* 26.9* 26.9* 23* 23*  MCV 88.1  --  85.6  < > 89.1 89.1 89.4  --   --   PLT 412* 385 332  < > 227 241 227 270 280  < > = values in this interval not displayed. Cardiac Enzymes:  Recent Labs  12/30/16 0618  CKTOTAL 29*   BNP: Invalid input(s): POCBNP CBG: No results for input(s): GLUCAP in the last 8760 hours.  Radiological Exams: Dg Chest [facility]  Result Date: 01/20/2017 CLINICAL DATA:  Shortness of breath. Follow-up abnormal chest radiograph. EXAM: PORTABLE CHEST 1 VIEW COMPARISON:  01/19/2017.  12/29/2016. FINDINGS: Markedly enlarged cardiac silhouette again demonstrated. This could be due  to cardiomegaly and/or pericardial fluid. Aortic atherosclerosis again demonstrated. There is pulmonary venous hypertension without frank edema. No consolidation or collapse. No visible effusions. Probably slightly improved since yesterday. IMPRESSION: Probably improved since yesterday. Cardiomegaly. Less venous hypertension. Electronically Signed   By: Paulina Fusi M.D.   On: 01/20/2017 09:20   Dg Chest Port 1 View  Result Date: 01/19/2017 CLINICAL DATA:  Fever. EXAM: PORTABLE CHEST 1 VIEW COMPARISON:  Radiographs of December 29, 2016. FINDINGS: Stable cardiomegaly. No pneumothorax or pleural effusion is noted. Increased central pulmonary vascular congestion and bilateral interstitial densities are noted concerning for pulmonary edema or possibly interstitial inflammation. Bony thorax is unremarkable. IMPRESSION: Increased central pulmonary vascular congestion is noted with increased bilateral interstitial densities concerning for pulmonary edema or possibly interstitial inflammation. Electronically Signed   By: Lupita Raider, M.D.   On: 01/19/2017 10:20    Assessment/Plan  Physical deconditioning With extreme generalized weakness. Will have him work with physical therapy and occupational therapy team to help with gait training and muscle strengthening exercises.fall precautions. Skin care. Encourage to be out of bed and to provide cushion to the chair wasn't setting. He is to not be out of bed for more than one are at a stretch to help promote wound healing. Poor overall prognosis.  Sacral pressure ulcer Status post surgical debridement in the hospital. Has completed antibiotic course. Has air mattress in place. Will need gel cushion find out of bed on a wheelchair or chair. Frequent repositioning is required to help promote healing. Rectal tube and Foley catheter in place to help promote wound healing. Continue Dilaudid 2 mg daily as needed prior to dressing change.  Aspiration  pneumonia Breathing currently stable. Has completed course of antibiotic. Monitor clinically  Chronic diastolic CHF Currently on torsemide 20 mg daily, carvedilol 12.5 mg twice a day check BMP, monitor his breathing  Chronic venous insufficiency With ulcers to his legs, ongoing, to provide compression wrap and skin care, to be advised to keep legs elevated at rest  Severe protein calorie malnutrition Will need to improve oral intake to help promote wound healing. Continue protein supplements. Registered dietitian on board. Chronic atrial fibrillation Controlled heart rate, continue Coreg 12.5 mg twice a day and eliquis 5 mg twice a day with atorvastatin 80 mg daily  GERD Continue famotidine 20 mg twice a day and monitor  Dementia with behavior disturbance On Namenda 5 mg twice a day, declined anticipated given his frequent infection and hospitalization. If patient does not make progress with therapy, will need to revisit discussion of goals of care. Continue Depakote 125 mg at bedtime and 250 mg in the morning and add Add Seroquel 25 mg every 8 hours as needed. Psychiatry consult  Chronic pain With pressure sores and ulcers to his legs and limited mobility. Continue morphine sulfate immediate release 15 mg daily at bedtime breath Norco 5-3 25 mg every 6 hours as needed for pain. PMR consult.    Goals of care: short term rehabilitation, possible long term care   Labs/tests ordered: cbc, cmp 02/17/17  Family/ staff Communication: reviewed care plan with patient and nursing supervisor    Oneal Grout, MD Internal Medicine [facility] [address] [place], [place] [address] Cell Phone (Monday-Friday 8 am -  5 pm): [phone] On Call: [phone] and follow prompts after 5 pm and on weekends Office Phone: [phone] Office Fax: [phone]

## 2017-02-17 NOTE — ED Notes (Signed)
Pt pulled out IV and leads, keeps asking me for multiple irrelevant things and pulling lines. Physician notified and technician pulled from floor to sit with pt

## 2017-02-17 NOTE — ED Triage Notes (Signed)
Patient comes in per [facility] with low hgb. Last wk hgb was 7 and today hgb 6.4. EMS v/s 130/68. According to facility bp has been low, around 95 systolically. No IV. Sacral wound noted. Fm [facility]. Una boots on. Flexy-seal and chronic cath. Hx dementia and combative. No meds given in route. Patient typically refuses to eat.

## 2017-02-17 NOTE — ED Provider Notes (Signed)
[facility]-EMERGENCY DEPT Provider Note   CSN: [id number] Arrival date & time: 02/17/17  1745     History   Chief Complaint Chief Complaint  Patient presents with  . Abnormal Lab    HPI Jesus [name] is a 71 y.o. male.  HPI  Patient presents from a nursing facility with concern of anemia. Patient has dementia, level V caveat. After the initial evaluation the patient's sister arrived, she assists the history of present illness. It seems as though the patient has multiple medical issues including renal disease, multiple cutaneous lesions, has chronic and with Foley catheter and rectal tube. Patient has multiple areas of skin breakdown, with decubitus ulcers. Today, the patient's nursing facility, routine blood work demonstrated the patient had new anemia. The patient cannot describe new complaints, provides a very inconsistent, tangential account of why he's here, responses to questions.   Past Medical History:  Diagnosis Date  . Acute on chronic renal failure (HCC)   . Arthritis    "normal for my age" (11/13/2016)  . Cellulitis and abscess of leg 11/2016  . Chronic anemia   . Chronic atrial fibrillation (HCC)   . Chronic diastolic CHF (congestive heart failure) (HCC) 12/2002   a. EF 50-55% by echo 04/2012  . Decubitus ulcer of buttock, unstageable (HCC) 12/31/2016  . Edema of lower extremity   . Gout   . H/O: GI bleed    "cause I took too much aspirin"  . High cholesterol   . Hyperglycemia    Noted 05/2012  . Hypertension   . Morbid obesity (HCC)   . PVD (peripheral vascular disease) (HCC)   . Venous stasis ulcer (HCC)     Patient Active Problem List   Diagnosis Date Noted  . Abnormal CXR   . Disorientation   . DNR (do not resuscitate)   . Palliative care by specialist   . Decubitus ulcer of sacral region, unstageable (HCC) 12/30/2016  . Delirium 12/30/2016  . Cellulitis of lower extremity   . Hyponatremia 07/15/2015  . Cellulitis of right lower extremity  07/15/2015  . Acute renal failure superimposed on stage 3 chronic kidney disease (HCC)   . Hypokalemia   . Chronic atrial fibrillation (HCC) 07/09/2015  . Chronic anemia 07/09/2015  . CHF exacerbation (HCC) 07/08/2015  . Chronic diastolic CHF (congestive heart failure) (HCC) 04/02/2015  . Chronic diastolic heart failure (HCC) 01/25/2015  . Cellulitis 01/17/2015  . Pulmonary vascular congestion   . Congestive heart disease (HCC)   . Peripheral edema   . Acute exacerbation of CHF (congestive heart failure) (HCC) 01/04/2015  . 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 Renal artery stenosis (HCC) 02/17/2014  . Peripheral vascular disease (HCC) 04/15/2013  . Chronic venous insufficiency 04/15/2013  . Morbid obesity (HCC) 06/11/2012  . Acute on chronic diastolic CHF (congestive heart failure) (HCC) 06/02/2012  . Unspecified essential hypertension 05/18/2012  . Atrial fibrillation (HCC) 04/27/2012  . CHF (congestive heart failure) (HCC) 04/27/2012  . Hypertension 04/27/2012    Past Surgical History:  Procedure Laterality Date  . DEBRIDMENT OF DECUBITUS ULCER N/A 01/02/2017   Procedure: DEBRIDMENT OF DECUBITUS ULCER;  Surgeon: Axel Filler, MD;  Location: [facility] OR;  Service: General;  Laterality: N/A;  . NO PAST SURGERIES         Home Medications    Prior to Admission medications   Medication Sig Start Date End Date Taking? Authorizing Provider  acetaminophen (TYLENOL) 500 MG tablet Take 500 mg by mouth every 8 (eight) hours as needed for moderate pain (Pain score 4-6/10).   Yes Historical Provider, MD  Amino Acids-Protein Hydrolys (FEEDING SUPPLEMENT, PRO-STAT SUGAR FREE 64,) LIQD Take 30 mLs by mouth every 6 (six) hours.   Yes Historical Provider, MD  apixaban (ELIQUIS) 5 MG TABS tablet Take 1 tablet (5 mg total) by mouth 2 (two) times daily. 07/18/15  Yes Shanker Levora DredgeM  Ghimire, MD  atorvastatin (LIPITOR) 80 MG tablet Take 1 tablet (80 mg total) by mouth daily. 07/18/15  Yes Shanker Levora DredgeM Ghimire, MD  carvedilol (COREG) 12.5 MG tablet Take 12.5 mg by mouth 2 (two) times daily with a meal.   Yes Historical Provider, MD  diphenhydrAMINE (BENADRYL) 25 MG tablet Take 25 mg by mouth every 6 (six) hours as needed for allergies.   Yes Historical Provider, MD  divalproex (DEPAKOTE SPRINKLE) 125 MG capsule Take 125 mg by mouth at bedtime.   Yes Historical Provider, MD  famotidine (PEPCID) 20 MG tablet Take 20 mg by mouth 2 (two) times daily.   Yes Historical Provider, MD  HYDROcodone-acetaminophen (NORCO/VICODIN) 5-325 MG tablet Take 1 tablet by mouth every 6 (six) hours as needed for moderate pain. 01/22/17  Yes Richarda OverlieNayana Abrol, MD  HYDROmorphone (DILAUDID) 2 MG tablet Take 2 mg by mouth daily as needed for severe pain. For dressing changes   Yes Historical Provider, MD  mirtazapine (REMERON) 7.5 MG tablet Take 7.5 mg by mouth at bedtime.    Yes Historical Provider, MD  morphine (MSIR) 15 MG tablet Take 15 mg by mouth at bedtime.   Yes Historical Provider, MD  Multiple Vitamin (MULTIVITAMIN WITH MINERALS) TABS tablet Take 1 tablet by mouth daily.   Yes Historical Provider, MD  NON FORMULARY Take 240 mLs by mouth 3 (three) times daily. Med pass   Yes Historical Provider, MD  OVER THE COUNTER MEDICATION Take 1 Container by mouth 3 (three) times daily with meals. MAGIC CUP   Yes Historical Provider, MD  thiamine 100 MG tablet Take 1 tablet (100 mg total) by mouth daily. 01/15/17  Yes Osvaldo ShipperGokul Krishnan, MD  torsemide (DEMADEX) 20 MG tablet Take 1 tablet (20 mg total) by mouth daily. 01/14/17  Yes Osvaldo ShipperGokul Krishnan, MD  Vitamins A & D (VITAMIN A & D) ointment Apply 1 application topically 2 (two) times daily.   Yes Historical Provider, MD  divalproex (DEPAKOTE ER) 250 MG 24 hr tablet Take 250 mg by mouth daily.    Historical Provider, MD  lactulose (CHRONULAC) 10 GM/15ML solution Take 20 g by  mouth every other day. Give 30 mL     Historical Provider, MD  memantine (NAMENDA) 5 MG tablet Take 5 mg by mouth 2 (two) times daily.    Historical Provider, MD  saccharomyces boulardii (FLORASTOR) 250 MG capsule Take 250 mg by mouth 2 (two) times daily.  12/18/16   Historical Provider, MD    Family History Family History  Problem Relation Age of Onset  . Arrhythmia Father   . Hypertension Father   . Cancer Mother     cervical    Social History Social History  Substance Use Topics  . Smoking status: Former Smoker    Types: Cigarettes    Quit date: 12/08/1965  . Smokeless tobacco: Never Used     Comment: "social cigarette smoker in my late teens"  . Alcohol use 8.4 oz/week    14 Cans of beer per week  Allergies   Amlodipine besylate and Levaquin [levofloxacin in d5w]   Review of Systems Review of Systems  Unable to perform ROS: Dementia     Physical Exam Updated Vital Signs BP (!) 117/46   Pulse 64   Resp 24   Ht 5\' 10"  (1.778 m)   Wt 300 lb (136.1 kg)   SpO2 96%   BMI 43.05 kg/m   Physical Exam  Constitutional:  Chronically ill-appearing elderly obese male with multiple areas of musculoskeletal padding on his distal extremities patient offers a variety of responses to specific questioning, with no consistent pattern  HENT:  Head: Normocephalic and atraumatic.  Eyes: EOM are normal. Pupils are equal, round, and reactive to light.  Neck: No tracheal deviation present.  Cardiovascular: Normal rate and regular rhythm.   Pulmonary/Chest: Effort normal. Stridor present.  Abdominal: There is no tenderness.  Patient does not move with abdominal exam  Genitourinary:  Genitourinary Comments: Patient has both a rectal tube and a urinary tube.  Discharge in both tubes seems appropriate   Neurological: He is alert. He displays atrophy. He exhibits abnormal muscle tone. He displays no seizure activity. Coordination abnormal.  Patient does not follow commands  reliably, but does respond verbally, when asked direct questions. Responses are inappropriate, tangential.  Skin:  Multiple areas of skin breakdown, with a large patch across the posterior  Psychiatric: His speech is tangential. Thought content is delusional. Cognition and memory are impaired. He is inattentive.  Nursing note and vitals reviewed.    ED Treatments / Results  Labs (all labs ordered are listed, but only abnormal results are displayed) Labs Reviewed  CBC WITH DIFFERENTIAL/PLATELET - Abnormal; Notable for the following:       Result Value   RBC 2.34 (*)    Hemoglobin 6.9 (*)    HCT 21.8 (*)    RDW 19.4 (*)    All other components within normal limits  BASIC METABOLIC PANEL - Abnormal; Notable for the following:    Sodium 134 (*)    Potassium 3.4 (*)    Chloride 94 (*)    Glucose, Bld 125 (*)    Calcium 8.3 (*)    All other components within normal limits  PROTIME-INR - Abnormal; Notable for the following:    Prothrombin Time 23.8 (*)    All other components within normal limits  POC OCCULT BLOOD, ED  TYPE AND SCREEN  PREPARE RBC (CROSSMATCH)    Procedures Procedures (including critical care time)  Medications Ordered in ED Medications  morphine 4 MG/ML injection 8 mg (0 mg Intravenous Hold 02/17/17 1845)  0.9 %  sodium chloride infusion (not administered)   Initial labs notable for 11 6.9. This is a decline from his recent evaluations, though there suspicion for anemia of chronic disease, patient will receive 1 unit blood. This is well received, with no evidence for active examination, no significant instability, patient will return to his nursing facility.   8:17 PM Hemoglobin here, 6.9, remaining labs generally consistent for him. I discussed this with the patient's daughter, his hour of attorney. She confirms that the patient has no capacity to participate in decision-making affirms need for transfusion. She also notes, agrees that if the patient  receives transfusion well, returning to his nursing facility for further evaluation of his anemia of chronic disease, other medical issues is appropriate.   Update: Patient agitated, aggressive with nursing staff, yelling out This is likely sundowning. Patient will receive sedation.   11:50 PM Patient has  tolerated blood well, no complications, now much calmer, awakening. With well-tolerated transfusion, no evidence for active examination, no other evidence for new pathology, and after discussion with his power of attorney, the patient will return to his nursing facility. Initial Impression / Assessment and Plan / ED Course  I have reviewed the triage vital signs and the nursing notes.  Pertinent labs & imaging results that were available during my care of the patient were reviewed by me and considered in my medical decision making (see chart for details).    Final Clinical Impressions(s) / ED Diagnoses  Anemia requiring transfusion Agitation   CRITICAL CARE Performed by: Gerhard Munch Total critical care time: 35 minutes Critical care time was exclusive of separately billable procedures and treating other patients. Critical care was necessary to treat or prevent imminent or life-threatening deterioration. Critical care was time spent personally by me on the following activities: development of treatment plan with patient and/or surrogate as well as nursing, discussions with consultants, evaluation of patient's response to treatment, examination of patient, obtaining history from patient or surrogate, ordering and performing treatments and interventions, ordering and review of laboratory studies, ordering and review of radiographic studies, pulse oximetry and re-evaluation of patient's condition.    Gerhard Munch, MD 02/17/17 2351

## 2017-02-17 NOTE — ED Notes (Signed)
Consent obtained, POA called and verbalized giving blood and agreed to procedure

## 2017-02-17 NOTE — Progress Notes (Signed)
DATE:  02/16/2017   MRN:  [id number]  BIRTHDAY: 04-04-1946  Facility:  Nursing Home Location:  [facility]  Nursing Home Room Number: 602-P  LEVEL OF CARE:  SNF (31)  Contact Information    Name Relation Home Work Mobile   [facility] Daughter   [phone]   Mintzer,[name] Son   [phone]   [facility] Daughter   [phone]   Mollie Germany Sister   [phone]       Code Status History    Date Active Date Inactive Code Status Order ID Comments User Context   01/08/2017  2:26 PM 01/24/2017  2:26 AM DNR [id number]  Canary Brim, NP Inpatient   12/30/2016  5:48 AM 01/08/2017  2:26 PM Full Code [id number]  Hillary Bow, DO ED   11/24/2016  1:24 PM 11/29/2016  3:07 PM Full Code [id number]  Gwenyth Bender, NP ED   11/13/2016 10:41 PM 11/17/2016  9:47 PM Full Code [id number]  Eduard Clos, MD Inpatient   07/09/2015 12:40 AM 07/18/2015  5:56 PM Full Code [id number]  Eduard Clos, MD Inpatient   01/04/2015 10:39 PM 01/12/2015  7:37 PM Full Code [id number]  Inez Catalina, MD Inpatient    Questions for Most Recent Historical Code Status (Order [id number])    Question Answer Comment   In the event of cardiac or respiratory ARREST Do not call a "code blue"    In the event of cardiac or respiratory ARREST Do not perform Intubation, CPR, defibrillation or ACLS    In the event of cardiac or respiratory ARREST Use medication by any route, position, wound care, and other measures to relive pain and suffering. May use oxygen, suction and manual treatment of airway obstruction as needed for comfort.        Chief Complaint  Patient presents with  . Hospitalization Follow-up    HISTORY OF PRESENT ILLNESS:  This is a 70-YO male seen for hospital follow-up.  He was admitted to [facility] on 02/13/2017 following an admission from [facility] 01/23/2017-02/12/2017. He has been admitted for a short-term rehabilitation. He was transferred to  [facility] from [facility].  He had an infected decubitus ulcer of sacral region. He was treated with Vancomycin and Zosyn. General surgery was consulted and had debridement on 1/26. He received hydrotherapy. He had bleeding around the wound area for which he required transfusion of 2 units PRBC. Cystic lesions incidentally noted on CT scan. MRI showed possible liver cirrhosis, pancreatic lesion which does not look malignant, likely benign versus Pseudocyst. Of note, before being transferred to [facility], he was having short-term rehabilitation @ [facility] following discharged from hospital,11/24/2016-11/29/2016 hospitalization,  with lower extremity cellulitis and CHF exacerbation. He has PMH of atrial fibrillation, hyperlipidemia, hypertension and sacral decubitus ulcer.  He was seen in the room and did not verbalize any concerns.   PAST MEDICAL HISTORY: .  Past Medical History:  Diagnosis Date  . Acute on chronic renal failure (HCC)   . Arthritis    "normal for my age" (11/13/2016)  . Cellulitis and abscess of leg 11/2016  . Chronic anemia   . Chronic atrial fibrillation (HCC)   . Chronic diastolic CHF (congestive heart failure) (HCC) 12/2002   a. EF 50-55% by echo 04/2012  . Decubitus ulcer of buttock, unstageable (HCC) 12/31/2016  . Edema of lower extremity   . Gout   . H/O: GI bleed    "  cause I took too much aspirin"  . High cholesterol   . Hyperglycemia    Noted 05/2012  . Hypertension   . Morbid obesity (HCC)   . PVD (peripheral vascular disease) (HCC)   . Venous stasis ulcer (HCC)      CURRENT MEDICATIONS: Reviewed  Patient's Medications  New Prescriptions   No medications on file  Previous Medications   ACETAMINOPHEN (TYLENOL) 500 MG TABLET    Take 500 mg by mouth every 8 (eight) hours as needed for moderate pain (Pain score 4-6/10).   AMINO ACIDS-PROTEIN HYDROLYS (FEEDING SUPPLEMENT, PRO-STAT SUGAR FREE 64,) LIQD    Take 30  mLs by mouth every 6 (six) hours.   APIXABAN (ELIQUIS) 5 MG TABS TABLET    Take 1 tablet (5 mg total) by mouth 2 (two) times daily.   ATORVASTATIN (LIPITOR) 80 MG TABLET    Take 1 tablet (80 mg total) by mouth daily.   CARVEDILOL (COREG) 12.5 MG TABLET    Take 12.5 mg by mouth 2 (two) times daily with a meal.   DIPHENHYDRAMINE (BENADRYL) 25 MG TABLET    Take 25 mg by mouth every 6 (six) hours as needed for allergies.   DIVALPROEX (DEPAKOTE ER) 250 MG 24 HR TABLET    Take 250 mg by mouth daily.   DIVALPROEX (DEPAKOTE SPRINKLE) 125 MG CAPSULE    Take 125 mg by mouth at bedtime.   FAMOTIDINE (PEPCID) 20 MG TABLET    Take 20 mg by mouth 2 (two) times daily.   HYDROCODONE-ACETAMINOPHEN (NORCO/VICODIN) 5-325 MG TABLET    Take 1 tablet by mouth every 6 (six) hours as needed for moderate pain.   HYDROMORPHONE (DILAUDID) 2 MG TABLET    Take 2 mg by mouth daily as needed for severe pain. For dressing changes   LACTULOSE (CHRONULAC) 10 GM/15ML SOLUTION    Take by mouth every other day. Give 30 mL   MEMANTINE (NAMENDA) 5 MG TABLET    Take 5 mg by mouth 2 (two) times daily.   MIRTAZAPINE (REMERON) 7.5 MG TABLET    Take 7.5 mg by mouth at bedtime.    MORPHINE (MSIR) 15 MG TABLET    Take 15 mg by mouth at bedtime.   MULTIPLE VITAMINS-MINERALS (DECUBI-VITE PO)    Take 1 capsule by mouth daily.   NON FORMULARY    Take 240 mLs by mouth 3 (three) times daily. Med pass   SACCHAROMYCES BOULARDII (FLORASTOR) 250 MG CAPSULE    Take 250 mg by mouth 2 (two) times daily.    THIAMINE 100 MG TABLET    Take 1 tablet (100 mg total) by mouth daily.   TORSEMIDE (DEMADEX) 20 MG TABLET    Take 1 tablet (20 mg total) by mouth daily.   UNABLE TO FIND    Med Name: A and D ointment. Apply 2 times daily to the affected area.   UNABLE TO FIND    Med Name: Magic cup daily with meals  Modified Medications   No medications on file  Discontinued Medications   AMINO ACIDS-PROTEIN HYDROLYS (FEEDING SUPPLEMENT, PRO-STAT SUGAR FREE 64,)  LIQD    Take 30 mLs by mouth 2 (two) times daily.   AMINO ACIDS-PROTEIN HYDROLYS (FEEDING SUPPLEMENT, PRO-STAT SUGAR FREE 64,) LIQD    Take 30 mLs by mouth 4 (four) times daily.   CARVEDILOL (COREG) 6.25 MG TABLET    Take 1 tablet (6.25 mg total) by mouth 2 (two) times daily with a meal.   COLLAGENASE (SANTYL)  OINTMENT    Apply topically daily.   DIVALPROEX (DEPAKOTE SPRINKLE) 125 MG CAPSULE    Take 4 capsules (500 mg total) by mouth at bedtime.   FEEDING SUPPLEMENT, ENSURE ENLIVE, (ENSURE ENLIVE) LIQD    Take 237 mLs by mouth 3 (three) times daily between meals.   HYDROXYZINE (ATARAX/VISTARIL) 25 MG TABLET    Take 1 tablet (25 mg total) by mouth 3 (three) times daily as needed (for itching).   PANTOPRAZOLE (PROTONIX) 40 MG TABLET    Take 1 tablet (40 mg total) by mouth 2 (two) times daily.   QUETIAPINE (SEROQUEL) 25 MG TABLET    Take 0.5 tablets (12.5 mg total) by mouth 2 (two) times daily.     Allergies  Allergen Reactions  . Amlodipine Besylate Swelling and Other (See Comments)    "started off low and ended up severe; if, in fact, that is what's causing the swelling" (06/03/12)  . Levaquin [Levofloxacin In D5w] Rash     REVIEW OF SYSTEMS:  GENERAL: no change in appetite, no fatigue, no weight changes, no fever, chills or weakness EYES: Denies change in vision, dry eyes, eye pain, itching or discharge EARS: Denies change in hearing, ringing in ears, or earache NOSE: Denies nasal congestion or epistaxis MOUTH and THROAT: Denies oral discomfort, gingival pain or bleeding, pain from teeth or hoarseness   RESPIRATORY: no cough, SOB, DOE, wheezing, hemoptysis CARDIAC: no chest pain, edema or palpitations GI: no abdominal pain, diarrhea, constipation, heart burn, nausea or vomiting GU: Denies dysuria, frequency, hematuria, incontinence, or discharge PSYCHIATRIC: Denies feeling of depression or anxiety. No report of hallucinations, insomnia, paranoia, or agitation    PHYSICAL  EXAMINATION  GENERAL APPEARANCE: Well nourished. In no acute distress.Morbidly obese  SKIN: Sacrum has stage IV pressure ulcer,LLL lateral superior and inferior venous ulcer, Left lateral ankle venous ulcer, right heel PU unstageable, right 5th metattarsal unstageable PU, right lateral lower leg venous ulcer and right lateral malleolus venous ulcer  HEAD: Normal in size and contour. No evidence of trauma EYES: Lids open and close normally. No blepharitis, entropion or ectropion. PERRL. Conjunctivae are clear and sclerae are white. Lenses are without opacity EARS: Pinnae are normal. Patient hears normal voice tunes of the examiner MOUTH and THROAT: Lips are without lesions. Oral mucosa is moist and without lesions. Tongue is normal in shape, size, and color and without lesions NECK: supple, trachea midline, no neck masses, no thyroid tenderness, no thyromegaly LYMPHATICS: no LAN in the neck, no supraclavicular LAN RESPIRATORY: breathing is even & unlabored, BS CTAB CARDIAC: RRR, no murmur,no extra heart sounds, no edema GI: abdomen soft, normal BS, no masses, no tenderness, no hepatomegaly, no splenomegaly GU:  Has foley catheter RECTAL:  Has flexi-seal EXTREMITIES:  Able to move X 4 extremities  NEUROLOGICAL: There is no tremor. Speech is clear PSYCHIATRIC: Alert to self and time, disoriented to place.  Affect and behavior are appropriate    LABS/RADIOLOGY: Labs reviewed: Basic Metabolic Panel:  Recent Labs  16/09/9611/10/17 1050  01/19/17 0524 01/20/17 0746 01/21/17 0520 02/06/17  NA 130*  < > 146* 142 139 139  K 4.7  < > 3.8 3.2* 3.7 4.1  CL 91*  < > 100* 96* 94*  --   CO2 29  < > 35* 34* 34*  --   GLUCOSE 111*  < > 109* 154* 115*  --   BUN 44*  < > 38* 38* 30* 21  CREATININE 1.83*  < > 1.34* 1.31* 1.19 0.9  CALCIUM 9.0  < > 8.8* 8.5* 8.4*  --   PHOS 3.5  --   --   --   --   --   < > = values in this interval not displayed. Liver Function Tests:  Recent Labs  11/24/16 1110  12/04/16 12/29/16 1916 01/01/17 0518  AST 23 26 31 23   ALT 19 25 23 20   ALKPHOS 181* 289* 200* 171*  BILITOT 1.4*  --  1.5* 1.5*  PROT 7.1  --  6.4* 6.3*  ALBUMIN 3.2*  --  2.3* 1.9*    Recent Labs  12/29/16 1916  LIPASE 27   CBC:  Recent Labs  11/28/16 0627 12/04/16 12/29/16 1916  01/21/17 0520 01/22/17 0538 01/23/17 0816 01/30/17 02/06/17  WBC 9.4 9.1 13.8*  < > 6.4 6.2 6.6 5.8 5.6  NEUTROABS 7.5 7 11.6*  --   --   --   --   --   --   HGB 11.7* 11.0* 11.7*  < > 9.0* 8.4* 8.4* 7.8* 7.9*  HCT 37.0* 34* 35.8*  < > 28.7* 26.9* 26.9* 23* 23*  MCV 88.1  --  85.6  < > 89.1 89.1 89.4  --   --   PLT 412* 385 332  < > 227 241 227 270 280  < > = values in this interval not displayed. Cardiac Enzymes:  Recent Labs  12/30/16 0618  CKTOTAL 29*    Dg Chest Port 1 View  Result Date: 01/20/2017 CLINICAL DATA:  Shortness of breath. Follow-up abnormal chest radiograph. EXAM: PORTABLE CHEST 1 VIEW COMPARISON:  01/19/2017.  12/29/2016. FINDINGS: Markedly enlarged cardiac silhouette again demonstrated. This could be due to cardiomegaly and/or pericardial fluid. Aortic atherosclerosis again demonstrated. There is pulmonary venous hypertension without frank edema. No consolidation or collapse. No visible effusions. Probably slightly improved since yesterday. IMPRESSION: Probably improved since yesterday. Cardiomegaly. Less venous hypertension. Electronically Signed   By: Paulina Fusi M.D.   On: 01/20/2017 09:20   Dg Chest Port 1 View  Result Date: 01/19/2017 CLINICAL DATA:  Fever. EXAM: PORTABLE CHEST 1 VIEW COMPARISON:  Radiographs of December 29, 2016. FINDINGS: Stable cardiomegaly. No pneumothorax or pleural effusion is noted. Increased central pulmonary vascular congestion and bilateral interstitial densities are noted concerning for pulmonary edema or possibly interstitial inflammation. Bony thorax is unremarkable. IMPRESSION: Increased central pulmonary vascular congestion is noted with  increased bilateral interstitial densities concerning for pulmonary edema or possibly interstitial inflammation. Electronically Signed   By: Lupita Raider, M.D.   On: 01/19/2017 10:20    ASSESSMENT/PLAN:  Generalized weakness - for rehabilitation, PT and OT, for therapeutic strengthening exercises; fall precautions  Sacral pressure ulcer - S/P debridement , continue treatment daily, air mattress  Chronic pain -  continue Dilaudid 2 mg 1 tab by mouth daily when necessary, morphine sulfate IR 15 mg 1 tab by mouth daily at bedtime, Norco 5/325 mg 1 tab by mouth every 6 hours when necessary and acetaminophen 500 mg 1 tab by mouth every 8 hours when necessary; physiatry consult  Paroxysmal atrial fibrillation - rate controlled; continue carvedilol 12.5 mg 1 tab by mouth twice a day and Eliquis 5 mg 1 tab by mouth twice a day  GERD - continue famotidine 20 mg 1 tab by mouth twice a day    Hyperlipidemia - continue atorvastatin 80 mg 1 tab by mouth daily  Chronic diastolic heart failure - no SOB; continue torsemide 20 mg 1 tab by mouth daily; check CMP  Mood disorder - continue Depakote 125 mg sprinkle or 1 tab by mouth daily at bedtime  Psychosis - continue Seroquel 25 mg 1 tab by mouth every 8 hours when necessary, psych consult  Constipation - continue lactulose 30 mL every 48 hours  Dementia - continue supportive care; continue Memantine 5 mg twice a day  Anemia - hgb 7.9; re-check CBC      Goals of care:  Short-term rehabilitation   Tracker Mance C. Medina-Vargas - NP    [employer] [phone]

## 2017-02-17 NOTE — ED Notes (Signed)
Pt started to desat while sleeping. Pt placed on 2L [place] with no increase above 90%, O2 increased to 3L [place] with 96% saturation

## 2017-02-17 NOTE — Discharge Instructions (Signed)
It is important that you follow-up with your physician, both for additional consideration of your anemia, and to consider your medications in general.  Return here for concerning changes in your condition.

## 2017-02-18 DIAGNOSIS — R261 Paralytic gait: Secondary | ICD-10-CM | POA: Diagnosis not present

## 2017-02-18 DIAGNOSIS — L89154 Pressure ulcer of sacral region, stage 4: Secondary | ICD-10-CM | POA: Diagnosis not present

## 2017-02-18 DIAGNOSIS — R600 Localized edema: Secondary | ICD-10-CM | POA: Diagnosis not present

## 2017-02-18 DIAGNOSIS — M6281 Muscle weakness (generalized): Secondary | ICD-10-CM | POA: Diagnosis not present

## 2017-02-18 DIAGNOSIS — G8929 Other chronic pain: Secondary | ICD-10-CM | POA: Diagnosis not present

## 2017-02-18 DIAGNOSIS — F0280 Dementia in other diseases classified elsewhere without behavioral disturbance: Secondary | ICD-10-CM | POA: Diagnosis not present

## 2017-02-18 DIAGNOSIS — R5381 Other malaise: Secondary | ICD-10-CM | POA: Diagnosis not present

## 2017-02-18 DIAGNOSIS — F339 Major depressive disorder, recurrent, unspecified: Secondary | ICD-10-CM | POA: Diagnosis not present

## 2017-02-18 DIAGNOSIS — F1027 Alcohol dependence with alcohol-induced persisting dementia: Secondary | ICD-10-CM | POA: Diagnosis not present

## 2017-02-18 DIAGNOSIS — F39 Unspecified mood [affective] disorder: Secondary | ICD-10-CM | POA: Diagnosis not present

## 2017-02-18 DIAGNOSIS — Z5189 Encounter for other specified aftercare: Secondary | ICD-10-CM | POA: Diagnosis not present

## 2017-02-18 LAB — TYPE AND SCREEN
ABO/RH(D): A POS
ANTIBODY SCREEN: NEGATIVE
Unit division: 0

## 2017-02-18 LAB — BPAM RBC
Blood Product Expiration Date: 201804032359
ISSUE DATE / TIME: 201803132123
Unit Type and Rh: 6200

## 2017-02-18 MED ORDER — HALOPERIDOL LACTATE 5 MG/ML IJ SOLN
5.0000 mg | Freq: Once | INTRAMUSCULAR | Status: AC
  Administered 2017-02-18: 5 mg via INTRAMUSCULAR
  Filled 2017-02-18: qty 1

## 2017-02-18 NOTE — ED Notes (Signed)
Wasted Morphine with Electrical engineerChelsea RN

## 2017-02-18 NOTE — ED Notes (Signed)
Attempted report multiple times, unable to reach nurse. Daughter aware of status of patient. Will continue to call to give report

## 2017-02-18 NOTE — ED Notes (Signed)
Pt unable to sign due to AMS, daughter aware of pt status

## 2017-02-19 DIAGNOSIS — R600 Localized edema: Secondary | ICD-10-CM | POA: Diagnosis not present

## 2017-02-19 DIAGNOSIS — Z5189 Encounter for other specified aftercare: Secondary | ICD-10-CM | POA: Diagnosis not present

## 2017-02-19 DIAGNOSIS — F0280 Dementia in other diseases classified elsewhere without behavioral disturbance: Secondary | ICD-10-CM | POA: Diagnosis not present

## 2017-02-19 DIAGNOSIS — L89154 Pressure ulcer of sacral region, stage 4: Secondary | ICD-10-CM | POA: Diagnosis not present

## 2017-02-19 DIAGNOSIS — R261 Paralytic gait: Secondary | ICD-10-CM | POA: Diagnosis not present

## 2017-02-19 DIAGNOSIS — R5381 Other malaise: Secondary | ICD-10-CM | POA: Diagnosis not present

## 2017-02-19 DIAGNOSIS — M6281 Muscle weakness (generalized): Secondary | ICD-10-CM | POA: Diagnosis not present

## 2017-02-19 DIAGNOSIS — G8929 Other chronic pain: Secondary | ICD-10-CM | POA: Diagnosis not present

## 2017-02-20 DIAGNOSIS — G8929 Other chronic pain: Secondary | ICD-10-CM | POA: Diagnosis not present

## 2017-02-20 DIAGNOSIS — Z5189 Encounter for other specified aftercare: Secondary | ICD-10-CM | POA: Diagnosis not present

## 2017-02-20 DIAGNOSIS — M6281 Muscle weakness (generalized): Secondary | ICD-10-CM | POA: Diagnosis not present

## 2017-02-20 DIAGNOSIS — L89154 Pressure ulcer of sacral region, stage 4: Secondary | ICD-10-CM | POA: Diagnosis not present

## 2017-02-20 DIAGNOSIS — R261 Paralytic gait: Secondary | ICD-10-CM | POA: Diagnosis not present

## 2017-02-20 DIAGNOSIS — R600 Localized edema: Secondary | ICD-10-CM | POA: Diagnosis not present

## 2017-02-20 DIAGNOSIS — R5381 Other malaise: Secondary | ICD-10-CM | POA: Diagnosis not present

## 2017-02-20 DIAGNOSIS — F0280 Dementia in other diseases classified elsewhere without behavioral disturbance: Secondary | ICD-10-CM | POA: Diagnosis not present

## 2017-02-23 DIAGNOSIS — L89154 Pressure ulcer of sacral region, stage 4: Secondary | ICD-10-CM | POA: Diagnosis not present

## 2017-02-23 DIAGNOSIS — I872 Venous insufficiency (chronic) (peripheral): Secondary | ICD-10-CM | POA: Diagnosis not present

## 2017-02-23 DIAGNOSIS — L89610 Pressure ulcer of right heel, unstageable: Secondary | ICD-10-CM | POA: Diagnosis not present

## 2017-02-23 DIAGNOSIS — L89890 Pressure ulcer of other site, unstageable: Secondary | ICD-10-CM | POA: Diagnosis not present

## 2017-02-24 ENCOUNTER — Inpatient Hospital Stay (HOSPITAL_COMMUNITY)
Admission: EM | Admit: 2017-02-24 | Discharge: 2017-03-17 | DRG: 698 | Disposition: A | Discharge status: Skilled Nursing Facility | Payer: Medicare HMO | Attending: Internal Medicine | Admitting: Internal Medicine

## 2017-02-24 ENCOUNTER — Emergency Department (HOSPITAL_COMMUNITY): Payer: Medicare HMO

## 2017-02-24 ENCOUNTER — Encounter (HOSPITAL_COMMUNITY): Payer: Self-pay

## 2017-02-24 DIAGNOSIS — Z6835 Body mass index (BMI) 35.0-35.9, adult: Secondary | ICD-10-CM

## 2017-02-24 DIAGNOSIS — I739 Peripheral vascular disease, unspecified: Secondary | ICD-10-CM | POA: Diagnosis present

## 2017-02-24 DIAGNOSIS — J9600 Acute respiratory failure, unspecified whether with hypoxia or hypercapnia: Secondary | ICD-10-CM | POA: Diagnosis not present

## 2017-02-24 DIAGNOSIS — L97909 Non-pressure chronic ulcer of unspecified part of unspecified lower leg with unspecified severity: Secondary | ICD-10-CM | POA: Diagnosis present

## 2017-02-24 DIAGNOSIS — G4733 Obstructive sleep apnea (adult) (pediatric): Secondary | ICD-10-CM | POA: Diagnosis not present

## 2017-02-24 DIAGNOSIS — G9341 Metabolic encephalopathy: Secondary | ICD-10-CM | POA: Diagnosis present

## 2017-02-24 DIAGNOSIS — T83518A Infection and inflammatory reaction due to other urinary catheter, initial encounter: Secondary | ICD-10-CM | POA: Diagnosis not present

## 2017-02-24 DIAGNOSIS — R443 Hallucinations, unspecified: Secondary | ICD-10-CM | POA: Diagnosis not present

## 2017-02-24 DIAGNOSIS — Z7401 Bed confinement status: Secondary | ICD-10-CM

## 2017-02-24 DIAGNOSIS — R0902 Hypoxemia: Secondary | ICD-10-CM

## 2017-02-24 DIAGNOSIS — R1311 Dysphagia, oral phase: Secondary | ICD-10-CM | POA: Diagnosis not present

## 2017-02-24 DIAGNOSIS — E46 Unspecified protein-calorie malnutrition: Secondary | ICD-10-CM | POA: Diagnosis present

## 2017-02-24 DIAGNOSIS — Z515 Encounter for palliative care: Secondary | ICD-10-CM

## 2017-02-24 DIAGNOSIS — J9690 Respiratory failure, unspecified, unspecified whether with hypoxia or hypercapnia: Secondary | ICD-10-CM

## 2017-02-24 DIAGNOSIS — L89610 Pressure ulcer of right heel, unstageable: Secondary | ICD-10-CM | POA: Diagnosis present

## 2017-02-24 DIAGNOSIS — Z9119 Patient's noncompliance with other medical treatment and regimen: Secondary | ICD-10-CM

## 2017-02-24 DIAGNOSIS — D689 Coagulation defect, unspecified: Secondary | ICD-10-CM | POA: Diagnosis not present

## 2017-02-24 DIAGNOSIS — Z7901 Long term (current) use of anticoagulants: Secondary | ICD-10-CM | POA: Diagnosis not present

## 2017-02-24 DIAGNOSIS — R627 Adult failure to thrive: Secondary | ICD-10-CM | POA: Diagnosis present

## 2017-02-24 DIAGNOSIS — R488 Other symbolic dysfunctions: Secondary | ICD-10-CM | POA: Diagnosis not present

## 2017-02-24 DIAGNOSIS — R509 Fever, unspecified: Secondary | ICD-10-CM | POA: Diagnosis not present

## 2017-02-24 DIAGNOSIS — L89150 Pressure ulcer of sacral region, unstageable: Secondary | ICD-10-CM | POA: Diagnosis not present

## 2017-02-24 DIAGNOSIS — J810 Acute pulmonary edema: Secondary | ICD-10-CM | POA: Diagnosis not present

## 2017-02-24 DIAGNOSIS — I872 Venous insufficiency (chronic) (peripheral): Secondary | ICD-10-CM | POA: Diagnosis present

## 2017-02-24 DIAGNOSIS — N390 Urinary tract infection, site not specified: Secondary | ICD-10-CM | POA: Diagnosis present

## 2017-02-24 DIAGNOSIS — J9622 Acute and chronic respiratory failure with hypercapnia: Secondary | ICD-10-CM | POA: Diagnosis present

## 2017-02-24 DIAGNOSIS — I878 Other specified disorders of veins: Secondary | ICD-10-CM | POA: Diagnosis present

## 2017-02-24 DIAGNOSIS — D500 Iron deficiency anemia secondary to blood loss (chronic): Secondary | ICD-10-CM | POA: Diagnosis present

## 2017-02-24 DIAGNOSIS — R0989 Other specified symptoms and signs involving the circulatory and respiratory systems: Secondary | ICD-10-CM | POA: Diagnosis not present

## 2017-02-24 DIAGNOSIS — Y846 Urinary catheterization as the cause of abnormal reaction of the patient, or of later complication, without mention of misadventure at the time of the procedure: Secondary | ICD-10-CM | POA: Diagnosis present

## 2017-02-24 DIAGNOSIS — J9621 Acute and chronic respiratory failure with hypoxia: Secondary | ICD-10-CM

## 2017-02-24 DIAGNOSIS — I5033 Acute on chronic diastolic (congestive) heart failure: Secondary | ICD-10-CM | POA: Diagnosis present

## 2017-02-24 DIAGNOSIS — M1990 Unspecified osteoarthritis, unspecified site: Secondary | ICD-10-CM | POA: Diagnosis present

## 2017-02-24 DIAGNOSIS — D638 Anemia in other chronic diseases classified elsewhere: Secondary | ICD-10-CM | POA: Diagnosis present

## 2017-02-24 DIAGNOSIS — K7460 Unspecified cirrhosis of liver: Secondary | ICD-10-CM | POA: Diagnosis present

## 2017-02-24 DIAGNOSIS — Z79899 Other long term (current) drug therapy: Secondary | ICD-10-CM

## 2017-02-24 DIAGNOSIS — R197 Diarrhea, unspecified: Secondary | ICD-10-CM | POA: Diagnosis not present

## 2017-02-24 DIAGNOSIS — D649 Anemia, unspecified: Secondary | ICD-10-CM | POA: Diagnosis not present

## 2017-02-24 DIAGNOSIS — J9601 Acute respiratory failure with hypoxia: Secondary | ICD-10-CM | POA: Diagnosis present

## 2017-02-24 DIAGNOSIS — T502X5A Adverse effect of carbonic-anhydrase inhibitors, benzothiadiazides and other diuretics, initial encounter: Secondary | ICD-10-CM | POA: Diagnosis present

## 2017-02-24 DIAGNOSIS — G9340 Encephalopathy, unspecified: Secondary | ICD-10-CM | POA: Diagnosis not present

## 2017-02-24 DIAGNOSIS — I83009 Varicose veins of unspecified lower extremity with ulcer of unspecified site: Secondary | ICD-10-CM | POA: Diagnosis present

## 2017-02-24 DIAGNOSIS — L89620 Pressure ulcer of left heel, unstageable: Secondary | ICD-10-CM | POA: Diagnosis present

## 2017-02-24 DIAGNOSIS — Z9989 Dependence on other enabling machines and devices: Secondary | ICD-10-CM | POA: Diagnosis not present

## 2017-02-24 DIAGNOSIS — T83511A Infection and inflammatory reaction due to indwelling urethral catheter, initial encounter: Principal | ICD-10-CM | POA: Diagnosis present

## 2017-02-24 DIAGNOSIS — L89309 Pressure ulcer of unspecified buttock, unspecified stage: Secondary | ICD-10-CM | POA: Diagnosis present

## 2017-02-24 DIAGNOSIS — J80 Acute respiratory distress syndrome: Secondary | ICD-10-CM | POA: Diagnosis not present

## 2017-02-24 DIAGNOSIS — R4182 Altered mental status, unspecified: Secondary | ICD-10-CM | POA: Diagnosis not present

## 2017-02-24 DIAGNOSIS — E7800 Pure hypercholesterolemia, unspecified: Secondary | ICD-10-CM | POA: Diagnosis present

## 2017-02-24 DIAGNOSIS — L89154 Pressure ulcer of sacral region, stage 4: Secondary | ICD-10-CM | POA: Diagnosis not present

## 2017-02-24 DIAGNOSIS — Z452 Encounter for adjustment and management of vascular access device: Secondary | ICD-10-CM

## 2017-02-24 DIAGNOSIS — I5032 Chronic diastolic (congestive) heart failure: Secondary | ICD-10-CM | POA: Diagnosis not present

## 2017-02-24 DIAGNOSIS — R600 Localized edema: Secondary | ICD-10-CM | POA: Diagnosis present

## 2017-02-24 DIAGNOSIS — N183 Chronic kidney disease, stage 3 (moderate): Secondary | ICD-10-CM | POA: Diagnosis present

## 2017-02-24 DIAGNOSIS — R293 Abnormal posture: Secondary | ICD-10-CM | POA: Diagnosis not present

## 2017-02-24 DIAGNOSIS — M109 Gout, unspecified: Secondary | ICD-10-CM | POA: Diagnosis present

## 2017-02-24 DIAGNOSIS — L03312 Cellulitis of back [any part except buttock]: Secondary | ICD-10-CM | POA: Diagnosis present

## 2017-02-24 DIAGNOSIS — A419 Sepsis, unspecified organism: Secondary | ICD-10-CM | POA: Diagnosis present

## 2017-02-24 DIAGNOSIS — M6281 Muscle weakness (generalized): Secondary | ICD-10-CM | POA: Diagnosis not present

## 2017-02-24 DIAGNOSIS — L97509 Non-pressure chronic ulcer of other part of unspecified foot with unspecified severity: Secondary | ICD-10-CM | POA: Diagnosis present

## 2017-02-24 DIAGNOSIS — Z7189 Other specified counseling: Secondary | ICD-10-CM

## 2017-02-24 DIAGNOSIS — I130 Hypertensive heart and chronic kidney disease with heart failure and stage 1 through stage 4 chronic kidney disease, or unspecified chronic kidney disease: Secondary | ICD-10-CM | POA: Diagnosis not present

## 2017-02-24 DIAGNOSIS — G8929 Other chronic pain: Secondary | ICD-10-CM | POA: Diagnosis present

## 2017-02-24 DIAGNOSIS — Z66 Do not resuscitate: Secondary | ICD-10-CM | POA: Diagnosis present

## 2017-02-24 DIAGNOSIS — R609 Edema, unspecified: Secondary | ICD-10-CM | POA: Diagnosis present

## 2017-02-24 DIAGNOSIS — J9602 Acute respiratory failure with hypercapnia: Secondary | ICD-10-CM

## 2017-02-24 DIAGNOSIS — L03319 Cellulitis of trunk, unspecified: Secondary | ICD-10-CM | POA: Diagnosis not present

## 2017-02-24 DIAGNOSIS — R278 Other lack of coordination: Secondary | ICD-10-CM | POA: Diagnosis not present

## 2017-02-24 DIAGNOSIS — R578 Other shock: Secondary | ICD-10-CM | POA: Diagnosis not present

## 2017-02-24 DIAGNOSIS — I509 Heart failure, unspecified: Secondary | ICD-10-CM | POA: Diagnosis not present

## 2017-02-24 DIAGNOSIS — F0390 Unspecified dementia without behavioral disturbance: Secondary | ICD-10-CM | POA: Diagnosis present

## 2017-02-24 DIAGNOSIS — E876 Hypokalemia: Secondary | ICD-10-CM | POA: Diagnosis not present

## 2017-02-24 DIAGNOSIS — I4891 Unspecified atrial fibrillation: Secondary | ICD-10-CM | POA: Diagnosis present

## 2017-02-24 DIAGNOSIS — L89899 Pressure ulcer of other site, unspecified stage: Secondary | ICD-10-CM | POA: Diagnosis present

## 2017-02-24 DIAGNOSIS — R918 Other nonspecific abnormal finding of lung field: Secondary | ICD-10-CM | POA: Diagnosis not present

## 2017-02-24 DIAGNOSIS — L03317 Cellulitis of buttock: Secondary | ICD-10-CM

## 2017-02-24 DIAGNOSIS — R451 Restlessness and agitation: Secondary | ICD-10-CM | POA: Diagnosis not present

## 2017-02-24 DIAGNOSIS — I482 Chronic atrial fibrillation: Secondary | ICD-10-CM | POA: Diagnosis present

## 2017-02-24 DIAGNOSIS — R41841 Cognitive communication deficit: Secondary | ICD-10-CM | POA: Diagnosis not present

## 2017-02-24 DIAGNOSIS — F39 Unspecified mood [affective] disorder: Secondary | ICD-10-CM | POA: Diagnosis present

## 2017-02-24 LAB — URINALYSIS, ROUTINE W REFLEX MICROSCOPIC
Bilirubin Urine: NEGATIVE
GLUCOSE, UA: NEGATIVE mg/dL
Ketones, ur: NEGATIVE mg/dL
Nitrite: POSITIVE — AB
Protein, ur: 30 mg/dL — AB
Specific Gravity, Urine: 1.02 (ref 1.005–1.030)
pH: 5.5 (ref 5.0–8.0)

## 2017-02-24 LAB — I-STAT ARTERIAL BLOOD GAS, ED
Acid-Base Excess: 7 mmol/L — ABNORMAL HIGH (ref 0.0–2.0)
BICARBONATE: 34.3 mmol/L — AB (ref 20.0–28.0)
O2 SAT: 98 %
PCO2 ART: 63.3 mmHg — AB (ref 32.0–48.0)
PO2 ART: 107 mmHg (ref 83.0–108.0)
TCO2: 36 mmol/L (ref 0–100)
pH, Arterial: 7.342 — ABNORMAL LOW (ref 7.350–7.450)

## 2017-02-24 LAB — COMPREHENSIVE METABOLIC PANEL
ALT: 19 U/L (ref 17–63)
ANION GAP: 10 (ref 5–15)
AST: 24 U/L (ref 15–41)
Albumin: 2 g/dL — ABNORMAL LOW (ref 3.5–5.0)
Alkaline Phosphatase: 165 U/L — ABNORMAL HIGH (ref 38–126)
BILIRUBIN TOTAL: 0.9 mg/dL (ref 0.3–1.2)
BUN: 17 mg/dL (ref 6–20)
CHLORIDE: 99 mmol/L — AB (ref 101–111)
CO2: 33 mmol/L — ABNORMAL HIGH (ref 22–32)
Calcium: 8.8 mg/dL — ABNORMAL LOW (ref 8.9–10.3)
Creatinine, Ser: 0.96 mg/dL (ref 0.61–1.24)
GFR calc Af Amer: >60 mL/min (ref >60)
Glucose, Bld: 97 mg/dL (ref 65–99)
POTASSIUM: 3.5 mmol/L (ref 3.5–5.1)
Sodium: 142 mmol/L (ref 135–145)
TOTAL PROTEIN: 6.1 g/dL — AB (ref 6.5–8.1)

## 2017-02-24 LAB — CBC WITH DIFFERENTIAL/PLATELET
BASOS ABS: 0 10*3/uL (ref 0.0–0.1)
Basophils Relative: 0 %
EOS PCT: 10 %
Eosinophils Absolute: 0.8 10*3/uL — ABNORMAL HIGH (ref 0.0–0.7)
HEMATOCRIT: 26.2 % — AB (ref 39.0–52.0)
HEMOGLOBIN: 8 g/dL — AB (ref 13.0–17.0)
LYMPHS ABS: 0.8 10*3/uL (ref 0.7–4.0)
LYMPHS PCT: 9 %
MCH: 28.5 pg (ref 26.0–34.0)
MCHC: 30.5 g/dL (ref 30.0–36.0)
MCV: 93.2 fL (ref 78.0–100.0)
Monocytes Absolute: 0.7 10*3/uL (ref 0.1–1.0)
Monocytes Relative: 8 %
NEUTROS ABS: 6.1 10*3/uL (ref 1.7–7.7)
NEUTROS PCT: 73 %
PLATELETS: 344 10*3/uL (ref 150–400)
RBC: 2.81 MIL/uL — AB (ref 4.22–5.81)
RDW: 18.4 % — ABNORMAL HIGH (ref 11.5–15.5)
WBC: 8.4 10*3/uL (ref 4.0–10.5)

## 2017-02-24 LAB — URINALYSIS, MICROSCOPIC (REFLEX)

## 2017-02-24 LAB — AMMONIA: Ammonia: 19 umol/L (ref 9–35)

## 2017-02-24 LAB — APTT: aPTT: 47 seconds — ABNORMAL HIGH (ref 24–36)

## 2017-02-24 LAB — PROCALCITONIN: Procalcitonin: 0.88 ng/mL

## 2017-02-24 LAB — PROTIME-INR
INR: 1.71
Prothrombin Time: 20.3 seconds — ABNORMAL HIGH (ref 11.4–15.2)

## 2017-02-24 LAB — VALPROIC ACID LEVEL: VALPROIC ACID LVL: 11 ug/mL — AB (ref 50.0–100.0)

## 2017-02-24 LAB — I-STAT CG4 LACTIC ACID, ED: LACTIC ACID, VENOUS: 1.19 mmol/L (ref 0.5–1.9)

## 2017-02-24 MED ORDER — PIPERACILLIN-TAZOBACTAM 3.375 G IVPB 30 MIN
3.3750 g | Freq: Once | INTRAVENOUS | Status: AC
  Administered 2017-02-24: 3.375 g via INTRAVENOUS

## 2017-02-24 MED ORDER — SODIUM CHLORIDE 0.9 % IV SOLN
INTRAVENOUS | Status: AC
  Administered 2017-02-24: 22:00:00 via INTRAVENOUS

## 2017-02-24 MED ORDER — SODIUM CHLORIDE 0.9% FLUSH
3.0000 mL | Freq: Two times a day (BID) | INTRAVENOUS | Status: DC
  Administered 2017-02-24 – 2017-03-09 (×11): 3 mL via INTRAVENOUS

## 2017-02-24 MED ORDER — LORAZEPAM 2 MG/ML IJ SOLN
1.0000 mg | Freq: Once | INTRAMUSCULAR | Status: AC
  Administered 2017-02-24: 1 mg via INTRAVENOUS
  Filled 2017-02-24: qty 1

## 2017-02-24 MED ORDER — ACETAMINOPHEN 325 MG PO TABS
650.0000 mg | ORAL_TABLET | Freq: Four times a day (QID) | ORAL | Status: DC | PRN
  Administered 2017-02-28 – 2017-03-15 (×5): 650 mg via ORAL
  Filled 2017-02-24 (×5): qty 2

## 2017-02-24 MED ORDER — VALPROATE SODIUM 500 MG/5ML IV SOLN
250.0000 mg | Freq: Two times a day (BID) | INTRAVENOUS | Status: DC
  Administered 2017-02-24 – 2017-02-27 (×6): 250 mg via INTRAVENOUS
  Filled 2017-02-24 (×8): qty 2.5

## 2017-02-24 MED ORDER — SODIUM CHLORIDE 0.9 % IV BOLUS (SEPSIS)
500.0000 mL | Freq: Once | INTRAVENOUS | Status: AC
  Administered 2017-02-24: 500 mL via INTRAVENOUS

## 2017-02-24 MED ORDER — VANCOMYCIN HCL IN DEXTROSE 1-5 GM/200ML-% IV SOLN: 1000.0000 mg | Freq: Once | INTRAVENOUS | Status: DC

## 2017-02-24 MED ORDER — DEXTROSE 5 % IV SOLN
250.0000 mg | Freq: Two times a day (BID) | INTRAVENOUS | Status: DC
  Filled 2017-02-24: qty 2.5

## 2017-02-24 MED ORDER — ONDANSETRON HCL 4 MG/2ML IJ SOLN
4.0000 mg | Freq: Four times a day (QID) | INTRAMUSCULAR | Status: DC | PRN
  Administered 2017-02-27: 4 mg via INTRAVENOUS
  Filled 2017-02-24 (×2): qty 2

## 2017-02-24 MED ORDER — FENTANYL CITRATE (PF) 100 MCG/2ML IJ SOLN
100.0000 ug | Freq: Once | INTRAMUSCULAR | Status: AC
  Administered 2017-02-24: 100 ug via INTRAVENOUS
  Filled 2017-02-24: qty 2

## 2017-02-24 MED ORDER — ONDANSETRON HCL 4 MG/2ML IJ SOLN
4.0000 mg | Freq: Once | INTRAMUSCULAR | Status: AC
  Administered 2017-02-24: 4 mg via INTRAVENOUS
  Filled 2017-02-24: qty 2

## 2017-02-24 MED ORDER — PIPERACILLIN-TAZOBACTAM 3.375 G IVPB
3.3750 g | Freq: Three times a day (TID) | INTRAVENOUS | Status: DC
  Administered 2017-02-25 – 2017-02-27 (×7): 3.375 g via INTRAVENOUS
  Filled 2017-02-24 (×9): qty 50

## 2017-02-24 MED ORDER — VANCOMYCIN HCL IN DEXTROSE 1-5 GM/200ML-% IV SOLN
1000.0000 mg | Freq: Two times a day (BID) | INTRAVENOUS | Status: DC
  Administered 2017-02-25 (×2): 1000 mg via INTRAVENOUS
  Filled 2017-02-24 (×3): qty 200

## 2017-02-24 MED ORDER — ACETAMINOPHEN 650 MG RE SUPP: 650.0000 mg | Freq: Four times a day (QID) | RECTAL | Status: DC | PRN

## 2017-02-24 MED ORDER — VANCOMYCIN HCL 10 G IV SOLR
2000.0000 mg | Freq: Once | INTRAVENOUS | Status: AC
  Administered 2017-02-24: 2000 mg via INTRAVENOUS
  Filled 2017-02-24: qty 2000

## 2017-02-24 MED ORDER — CEFTRIAXONE SODIUM 1 G IJ SOLR: 1.0000 g | Freq: Once | INTRAMUSCULAR | Status: DC

## 2017-02-24 MED ORDER — ONDANSETRON HCL 4 MG PO TABS: 4.0000 mg | ORAL_TABLET | Freq: Four times a day (QID) | ORAL | Status: DC | PRN

## 2017-02-24 NOTE — H&P (Signed)
History and Physical    Jesus [name] UJW:[id number] DOB: May 24, 1946 DOA: 02/24/2017  PCP: Miki Kins   Patient coming from: [facility]  Chief Complaint: Altered mental status, hypoxia  HPI: Jesus [name] is a 71 y.o. gentleman with a history of chronic atrial fibrillation (anticoagulated with Eliquis; CHADS-Vasc score of 4), CKD 3, chronic diastolic heart failure, HTN, chronic anemia, chronic pain, chronic venous insufficiency, chronic edema, cirrhosis of the liver by CT, incidental lesions in his liver and pancreas seen on prior CT imaging, and recent admission from 1/22-2/16 for infected sacral decubitus ulcer requiring surgical debridement and aspiration pneumonia.  The wound ultimately required a wound vac and he was referred to [facility].  According to his daughter, he did well there, but has declined quickly since being back at [facility] for less than one week.  The patient's sister is at bedside at the time of admission and reports that the patient demonstrates intermittent periods of forgetfulness when he is at his baseline.  He is bed bound at baseline.  She last saw him on Sunday and he seemed well.  Today, she was notified that his "breathing was off" and his O2 sats were 87% on RA at SNF.  This prompted referral to the ED via EMS.  The patient indicated pain in his right leg, according to the ED attending, before receiving of fentanyl and 1mg  of ativan (both IV), which has left the patient obtunded longer than anticipated.     Of note HPI, taken from EMR, verbal report from the ED, and information from family.  ED Course: ED attending concerned about catheter associated UTI (patient presumably has chronic indwelling foley catheter due to his large sacral decubitus ulcer).  U/A shows large leukocytes, nitrite POSITIVE, 6-30 WBC, many bacteria.  Blood and urine cultures are pending.  The patient received IV Rocephin and a 500cc NS bolus.  Lactic acid level 1.19.  Ammonia  level normal.  Hgb 8 and stable (required recent transfusion in the ED).    Hospitalist asked to admit.  Bicarb noted to be 33 on CMP so ABG was ordered to assess for CO2 retention. PH 7.3, pCO2 63.  RT is monitoring.  DNR/DNI status confirmed with his daughter.  Review of Systems: unable to obtain from the patient due to mental status changes.   Past Medical History:  Diagnosis Date  . Acute on chronic renal failure (HCC)   . Arthritis    "normal for my age" (11/13/2016)  . Cellulitis and abscess of leg 11/2016  . Chronic anemia   . Chronic atrial fibrillation (HCC)   . Chronic diastolic CHF (congestive heart failure) (HCC) 12/2002   a. EF 50-55% by echo 04/2012  . Decubitus ulcer of buttock, unstageable (HCC) 12/31/2016  . Edema of lower extremity   . Gout   . H/O: GI bleed    "cause I took too much aspirin"  . High cholesterol   . Hyperglycemia    Noted 05/2012  . Hypertension   . Morbid obesity (HCC)   . PVD (peripheral vascular disease) (HCC)   . Venous stasis ulcer (HCC)     Past Surgical History:  Procedure Laterality Date  . DEBRIDMENT OF DECUBITUS ULCER N/A 01/02/2017   Procedure: DEBRIDMENT OF DECUBITUS ULCER;  Surgeon: Axel Filler, MD;  Location: [facility] OR;  Service: General;  Laterality: N/A;  . NO PAST SURGERIES      SOCIAL HISTORY: According to his sister, he has never smoked.  Former EtOH  use.  No illicit drug use.  He is divorced.  He has three adult children.  One daughter is the designated POA.  Allergies  Allergen Reactions  . Amlodipine Besylate Swelling and Other (See Comments)    "started off low and ended up severe; if, in fact, that is what's causing the swelling" (06/03/12)  . Levaquin [Levofloxacin In D5w] Rash    Family History  Problem Relation Age of Onset  . Arrhythmia Father   . Hypertension Father   . Cancer Mother     cervical     Prior to Admission medications   Medication Sig Start Date End Date Taking? Authorizing Provider    acetaminophen (TYLENOL) 500 MG tablet Take 500 mg by mouth every 8 (eight) hours as needed for moderate pain (Pain score 4-6/10).   Yes Historical Provider, MD  Amino Acids-Protein Hydrolys (FEEDING SUPPLEMENT, PRO-STAT SUGAR FREE 64,) LIQD Take 30 mLs by mouth every 6 (six) hours.   Yes Historical Provider, MD  apixaban (ELIQUIS) 5 MG TABS tablet Take 1 tablet (5 mg total) by mouth 2 (two) times daily. 07/18/15  Yes Shanker Levora DredgeM Ghimire, MD  atorvastatin (LIPITOR) 80 MG tablet Take 1 tablet (80 mg total) by mouth daily. 07/18/15  Yes Shanker Levora DredgeM Ghimire, MD  carvedilol (COREG) 12.5 MG tablet Take 12.5 mg by mouth 2 (two) times daily with a meal.   Yes Historical Provider, MD  diphenhydrAMINE (BENADRYL) 25 MG tablet Take 25 mg by mouth every 6 (six) hours as needed for allergies.   Yes Historical Provider, MD  divalproex (DEPAKOTE ER) 250 MG 24 hr tablet Take 250 mg by mouth daily.   Yes Historical Provider, MD  divalproex (DEPAKOTE SPRINKLE) 125 MG capsule Take 125 mg by mouth at bedtime.   Yes Historical Provider, MD  famotidine (PEPCID) 20 MG tablet Take 20 mg by mouth 2 (two) times daily.   Yes Historical Provider, MD  HYDROcodone-acetaminophen (NORCO/VICODIN) 5-325 MG tablet Take 1 tablet by mouth every 6 (six) hours as needed for moderate pain. 01/22/17  Yes Richarda OverlieNayana Abrol, MD  HYDROmorphone (DILAUDID) 2 MG tablet Take 2 mg by mouth daily as needed for severe pain. For dressing changes   Yes Historical Provider, MD  memantine (NAMENDA) 5 MG tablet Take 5 mg by mouth 2 (two) times daily.   Yes Historical Provider, MD  Multiple Vitamin (MULTIVITAMIN WITH MINERALS) TABS tablet Take 1 tablet by mouth daily.   Yes Historical Provider, MD  saccharomyces boulardii (FLORASTOR) 250 MG capsule Take 250 mg by mouth 2 (two) times daily.  12/18/16  Yes Historical Provider, MD  thiamine 100 MG tablet Take 1 tablet (100 mg total) by mouth daily. 01/15/17  Yes Osvaldo ShipperGokul Krishnan, MD  torsemide (DEMADEX) 20 MG tablet Take 1  tablet (20 mg total) by mouth daily. 01/14/17  Yes Osvaldo ShipperGokul Krishnan, MD  lactulose (CHRONULAC) 10 GM/15ML solution Take 20 g by mouth every other day. Give 30 mL     Historical Provider, MD  mirtazapine (REMERON) 7.5 MG tablet Take 7.5 mg by mouth at bedtime.     Historical Provider, MD  morphine (MSIR) 15 MG tablet Take 15 mg by mouth at bedtime.    Historical Provider, MD  OVER THE COUNTER MEDICATION Take 1 Container by mouth 3 (three) times daily with meals. MAGIC CUP    Historical Provider, MD  Vitamins A & D (VITAMIN A & D) ointment Apply 1 application topically 2 (two) times daily.    Historical Provider, MD  Physical Exam: Vitals:   02/24/17 1730 02/24/17 1830 02/24/17 1845 02/24/17 2000  BP: (!) 142/87 122/63 132/89 (!) 105/45  Pulse: 70 76 62 75  Resp: (!) 22 (!) 26 (!) 28 (!) 25  Temp:      TempSrc:      SpO2: 99% 100% 100% 97%      Constitutional: NAD, obtunded, occasionally moan presumably due to pain Vitals:   02/24/17 1730 02/24/17 1830 02/24/17 1845 02/24/17 2000  BP: (!) 142/87 122/63 132/89 (!) 105/45  Pulse: 70 76 62 75  Resp: (!) 22 (!) 26 (!) 28 (!) 25  Temp:      TempSrc:      SpO2: 99% 100% 100% 97%   Eyes: PERRL, lids and conjunctivae normal ENMT: Mucous membranes are DRY. Posterior pharynx not completely visualized.    Neck: normal appearance, supple, no masses Respiratory: clear to auscultation bilaterally, no wheezing, no crackles. Normal respiratory effort. No accessory muscle use.  Cardiovascular: Irregular but rate controlled.  Trace pretibial edema bilaterally.    GI: abdomen is obese but soft and compressible.  No distention.  Bowel sounds are present. Musculoskeletal:  No joint deformity in upper and lower extremities. Moves extremities spontaneously.  No apparent contractures. Normal muscle tone.  Skin: Patient's sister refused to let me remove bilateral leg dressings in the ED, stating that "the problem is with his back".  Sacral wound dressing  completely saturated with malodorous purulent discharge.  The ulcer in his back (which appears to go down to bone) still appears clean based but there is a 3-4cm rim of erythematous skin around the actually opening of the ulcer that is worrisome for new/progressive cellulitic changes and eventual necrosis. Neurologic: Unable to obtain due to altered mental status. Psychiatric: Unable to completely assess. Based on recent history, judgment and insight impaired at baseline.    Labs on Admission: I have personally reviewed following labs and imaging studies  CBC:  Recent Labs Lab 02/24/17 1421  WBC 8.4  NEUTROABS 6.1  HGB 8.0*  HCT 26.2*  MCV 93.2  PLT 344   Basic Metabolic Panel:  Recent Labs Lab 02/24/17 1421  NA 142  K 3.5  CL 99*  CO2 33*  GLUCOSE 97  BUN 17  CREATININE 0.96  CALCIUM 8.8*   GFR: Estimated Creatinine Clearance: 99.5 mL/min (by C-G formula based on SCr of 0.96 mg/dL). Liver Function Tests:  Recent Labs Lab 02/24/17 1421  AST 24  ALT 19  ALKPHOS 165*  BILITOT 0.9  PROT 6.1*  ALBUMIN 2.0*    Recent Labs Lab 02/24/17 1421  AMMONIA 19   Urine analysis:    Component Value Date/Time   COLORURINE YELLOW 02/24/2017 1604   APPEARANCEUR CLOUDY (A) 02/24/2017 1604   LABSPEC 1.020 02/24/2017 1604   PHURINE 5.5 02/24/2017 1604   GLUCOSEU NEGATIVE 02/24/2017 1604   HGBUR LARGE (A) 02/24/2017 1604   BILIRUBINUR NEGATIVE 02/24/2017 1604   KETONESUR NEGATIVE 02/24/2017 1604   PROTEINUR 30 (A) 02/24/2017 1604   UROBILINOGEN 4.0 (H) 06/02/2012 1200   NITRITE POSITIVE (A) 02/24/2017 1604   LEUKOCYTESUR LARGE (A) 02/24/2017 1604   Sepsis Labs:  Lactic acid level 1.19 Procalcitonin level 0.88  Depakote level 11  INR 1.7  Radiological Exams on Admission: Dg Chest Port 1 View  Result Date: 02/24/2017 CLINICAL DATA:  Altered mental status EXAM: PORTABLE CHEST 1 VIEW COMPARISON:  January 20, 2017 FINDINGS: There is persistent cardiomegaly. There  is pulmonary venous hypertension. There is no edema or  consolidation. No adenopathy. No bone lesions. IMPRESSION: Pulmonary vascular congestion without edema or consolidation. Electronically Signed   By: Bretta Bang III M.D.   On: 02/24/2017 15:08    EKG: Independently reviewed by me.  Rate controlled atrial fibrillation.  Assessment/Plan Principal Problem:   Acute encephalopathy Active Problems:   Hypertension   Peripheral vascular disease (HCC)   Chronic venous insufficiency   Peripheral edema   Cellulitis   Chronic diastolic CHF (congestive heart failure) (HCC)   Decubitus ulcer of sacral region, unstageable (HCC)      Evolving sepsis secondary to catheter associated UTI as well as probable cellulitis and superimposed infection involving chronic nonhealing sacral decubitus ulcer, both present on admission. --IV Rocephin given in the ED, will broaden coverage to vanc and zosyn --Blood and urine cultures pending --Wound consult pending, hopefully he will not need further debridement  Acute encephalopathy exacerbated by sedating medications --Monitor in the stepdown unit for now --All oral medication on hold for now; if mental status not improved by morning, will need to address alternative anticoagulation --Patient's sister refused head CT as part of work-up for mental status changes  Subtherapeutic depakote level --Oral depakote dosing converted to IV 250mg  BID for now --Follow levels  Noted to be slightly coagulopathic; likely combination of cirrhosis and anticoagulation.  Monitor, particulary if he needs an intervention (may need vitamin K).  HTN; Relative hypotension now --NS bolus prn --Oral BP meds on hold  Chronic atrial fibrillation --Currently rate controlled --may need to consider alternative to Eliquis if not tolerating PO by AM  Chronic diastolic heart failure, compensated  Chronic venous stasis, edema, foot ulcers.  I did not see (sister objected).   Defer to new attending and wound care team in the AM.  Overall prognosis is poor.  Daughter is upholding DNR/DNI status at this time.   DVT prophylaxis: Anticoagulated with Eliquis and slightly coagulopathic Code Status: DNR Family Communication: Sister at bedside in the ED at time of admission.  I spoke to his daughter who is his POA by phone. Disposition Plan: Daughter would like for him to go back to LT[facility] at discharge if he requalifies.   Consults called: NONE Admission status: Inpatient, stepdown unit.  I expect this patient will need inpatient services for greater than two midnights.   TIME SPENT: 70 minutes   Jerene Bears MD [facility] Pager [phone]  If 7PM-7AM, please contact night-coverage [email or web address] Password [email or web address]  02/24/2017, 9:02 PM

## 2017-02-24 NOTE — ED Notes (Signed)
Patients Sats dropped to 70% with good waveform  No rebreather applied and sats came back up to 100% Dr. Effie ShyWentz notified

## 2017-02-24 NOTE — ED Provider Notes (Signed)
[facility]-EMERGENCY DEPT Provider Note   CSN: [id number] Arrival date & time: 02/24/17  1257     History   Chief Complaint Chief Complaint  Patient presents with  . Altered Mental Status    HPI Jesus [name] is a 71 y.o. male.  Patient sent here for evaluation of altered mental status, characterized by difficult to arouse earlier today at his facility.  He is being treated for a sacral decubitus, and was recently in a long-term care hospital, and has been transferred to rehab.  Currently he is in a skilled nursing facility.  He is unable to give cogent history.   Level 5 caveat-altered mental status  HPI  Past Medical History:  Diagnosis Date  . Acute on chronic renal failure (HCC)   . Arthritis    "normal for my age" (11/13/2016)  . Cellulitis and abscess of leg 11/2016  . Chronic anemia   . Chronic atrial fibrillation (HCC)   . Chronic diastolic CHF (congestive heart failure) (HCC) 12/2002   a. EF 50-55% by echo 04/2012  . Decubitus ulcer of buttock, unstageable (HCC) 12/31/2016  . Edema of lower extremity   . Gout   . H/O: GI bleed    "cause I took too much aspirin"  . High cholesterol   . Hyperglycemia    Noted 05/2012  . Hypertension   . Morbid obesity (HCC)   . PVD (peripheral vascular disease) (HCC)   . Venous stasis ulcer (HCC)     Patient Active Problem List   Diagnosis Date Noted  . Abnormal CXR   . Disorientation   . DNR (do not resuscitate)   . Palliative care by specialist   . Decubitus ulcer of sacral region, unstageable (HCC) 12/30/2016  . Delirium 12/30/2016  . Cellulitis of lower extremity   . Hyponatremia 07/15/2015  . Cellulitis of right lower extremity 07/15/2015  . Acute renal failure superimposed on stage 3 chronic kidney disease (HCC)   . Hypokalemia   . Chronic atrial fibrillation (HCC) 07/09/2015  . Chronic anemia 07/09/2015  . CHF exacerbation (HCC) 07/08/2015  . Chronic diastolic CHF (congestive heart failure) (HCC) 04/02/2015    . Chronic diastolic heart failure (HCC) 01/25/2015  . Cellulitis 01/17/2015  . Pulmonary vascular congestion   . Congestive heart disease (HCC)   . Peripheral edema   . Acute exacerbation of CHF (congestive heart failure) (HCC) 01/04/2015  . 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 Renal artery stenosis (HCC) 02/17/2014  . Peripheral vascular disease (HCC) 04/15/2013  . Chronic venous insufficiency 04/15/2013  . Morbid obesity (HCC) 06/11/2012  . Acute on chronic diastolic CHF (congestive heart failure) (HCC) 06/02/2012  . Unspecified essential hypertension 05/18/2012  . Atrial fibrillation (HCC) 04/27/2012  . CHF (congestive heart failure) (HCC) 04/27/2012  . Hypertension 04/27/2012    Past Surgical History:  Procedure Laterality Date  . DEBRIDMENT OF DECUBITUS ULCER N/A 01/02/2017   Procedure: DEBRIDMENT OF DECUBITUS ULCER;  Surgeon: Axel Filler, MD;  Location: [facility] OR;  Service: General;  Laterality: N/A;  . NO PAST SURGERIES         Home Medications    Prior to Admission medications   Medication Sig Start Date End Date Taking? Authorizing Provider  acetaminophen (TYLENOL) 500 MG tablet Take 500 mg by mouth every 8 (eight) hours as needed for moderate pain (Pain score 4-6/10).   Yes Historical Provider, MD  Amino Acids-Protein Hydrolys (  FEEDING SUPPLEMENT, PRO-STAT SUGAR FREE 64,) LIQD Take 30 mLs by mouth every 6 (six) hours.   Yes Historical Provider, MD  apixaban (ELIQUIS) 5 MG TABS tablet Take 1 tablet (5 mg total) by mouth 2 (two) times daily. 07/18/15  Yes Shanker Levora Dredge, MD  atorvastatin (LIPITOR) 80 MG tablet Take 1 tablet (80 mg total) by mouth daily. 07/18/15  Yes Shanker Levora Dredge, MD  carvedilol (COREG) 12.5 MG tablet Take 12.5 mg by mouth 2 (two) times daily with a meal.   Yes Historical Provider, MD  diphenhydrAMINE (BENADRYL) 25 MG tablet Take 25  mg by mouth every 6 (six) hours as needed for allergies.   Yes Historical Provider, MD  divalproex (DEPAKOTE ER) 250 MG 24 hr tablet Take 250 mg by mouth daily.   Yes Historical Provider, MD  divalproex (DEPAKOTE SPRINKLE) 125 MG capsule Take 125 mg by mouth at bedtime.   Yes Historical Provider, MD  famotidine (PEPCID) 20 MG tablet Take 20 mg by mouth 2 (two) times daily.   Yes Historical Provider, MD  HYDROcodone-acetaminophen (NORCO/VICODIN) 5-325 MG tablet Take 1 tablet by mouth every 6 (six) hours as needed for moderate pain. 01/22/17  Yes Richarda Overlie, MD  HYDROmorphone (DILAUDID) 2 MG tablet Take 2 mg by mouth daily as needed for severe pain. For dressing changes   Yes Historical Provider, MD  memantine (NAMENDA) 5 MG tablet Take 5 mg by mouth 2 (two) times daily.   Yes Historical Provider, MD  Multiple Vitamin (MULTIVITAMIN WITH MINERALS) TABS tablet Take 1 tablet by mouth daily.   Yes Historical Provider, MD  saccharomyces boulardii (FLORASTOR) 250 MG capsule Take 250 mg by mouth 2 (two) times daily.  12/18/16  Yes Historical Provider, MD  thiamine 100 MG tablet Take 1 tablet (100 mg total) by mouth daily. 01/15/17  Yes Osvaldo Shipper, MD  torsemide (DEMADEX) 20 MG tablet Take 1 tablet (20 mg total) by mouth daily. 01/14/17  Yes Osvaldo Shipper, MD  lactulose (CHRONULAC) 10 GM/15ML solution Take 20 g by mouth every other day. Give 30 mL     Historical Provider, MD  mirtazapine (REMERON) 7.5 MG tablet Take 7.5 mg by mouth at bedtime.     Historical Provider, MD  morphine (MSIR) 15 MG tablet Take 15 mg by mouth at bedtime.    Historical Provider, MD  NON FORMULARY Take 240 mLs by mouth 3 (three) times daily. Med pass    Historical Provider, MD  OVER THE COUNTER MEDICATION Take 1 Container by mouth 3 (three) times daily with meals. MAGIC CUP    Historical Provider, MD  Vitamins A & D (VITAMIN A & D) ointment Apply 1 application topically 2 (two) times daily.    Historical Provider, MD    Family  History Family History  Problem Relation Age of Onset  . Arrhythmia Father   . Hypertension Father   . Cancer Mother     cervical    Social History Social History  Substance Use Topics  . Smoking status: Former Smoker    Types: Cigarettes    Quit date: 12/08/1965  . Smokeless tobacco: Never Used     Comment: "social cigarette smoker in my late teens"  . Alcohol use 8.4 oz/week    14 Cans of beer per week     Allergies   Amlodipine besylate and Levaquin [levofloxacin in d5w]   Review of Systems Review of Systems  Unable to perform ROS: Mental status change     Physical Exam  Updated Vital Signs BP (!) 142/87   Pulse 70   Temp 100.2 F (37.9 C) (Rectal)   Resp (!) 22   SpO2 99%   Physical Exam  Constitutional: He appears well-developed. He appears distressed (He is uncomfortable.  He is periodically moaning.).  Elderly, overweight  HENT:  Head: Normocephalic and atraumatic.  Right Ear: External ear normal.  Left Ear: External ear normal.  Oral mucous membranes dry  Eyes: Conjunctivae and EOM are normal. Pupils are equal, round, and reactive to light.  Neck: Normal range of motion and phonation normal. Neck supple.  Cardiovascular: Normal rate, regular rhythm and normal heart sounds.   Pulmonary/Chest: Effort normal and breath sounds normal. He exhibits no bony tenderness.  Abdominal: Soft. There is no tenderness.  Musculoskeletal:  Wraps on both lower legs, with Kerlix, and both ankles and feet are splinted, with a removable device.  No gross deformities of the arms hips or knees.  Neurological: He is alert. No cranial nerve deficit or sensory deficit. He exhibits normal muscle tone. Coordination normal.  He is confused.  He is not dysarthric.  He is not aphasic.  Skin: Skin is warm, dry and intact.  Large sacral decubitus ulcer, with intact packing, mild saturation of dressings, with serous type discharge.  Moderate skin breakdown of the superficial skin of the  right buttock.  No involvement of the scrotum.  No associated bleeding.  Mild tenderness in the area of the decubitus.  Psychiatric:  He is agitated and aggressive.  Nursing note and vitals reviewed.    ED Treatments / Results  Labs (all labs ordered are listed, but only abnormal results are displayed) Labs Reviewed  COMPREHENSIVE METABOLIC PANEL - Abnormal; Notable for the following:       Result Value   Chloride 99 (*)    CO2 33 (*)    Calcium 8.8 (*)    Total Protein 6.1 (*)    Albumin 2.0 (*)    Alkaline Phosphatase 165 (*)    All other components within normal limits  CBC WITH DIFFERENTIAL/PLATELET - Abnormal; Notable for the following:    RBC 2.81 (*)    Hemoglobin 8.0 (*)    HCT 26.2 (*)    RDW 18.4 (*)    Eosinophils Absolute 0.8 (*)    All other components within normal limits  URINALYSIS, ROUTINE W REFLEX MICROSCOPIC - Abnormal; Notable for the following:    APPearance CLOUDY (*)    Hgb urine dipstick LARGE (*)    Protein, ur 30 (*)    Nitrite POSITIVE (*)    Leukocytes, UA LARGE (*)    All other components within normal limits  URINALYSIS, MICROSCOPIC (REFLEX) - Abnormal; Notable for the following:    Bacteria, UA MANY (*)    Squamous Epithelial / LPF 0-5 (*)    All other components within normal limits  CULTURE, BLOOD (ROUTINE X 2)  URINE CULTURE  CULTURE, BLOOD (ROUTINE X 2)  AMMONIA  I-STAT CG4 LACTIC ACID, ED    EKG  EKG Interpretation None       Radiology Dg Chest Port 1 View  Result Date: 02/24/2017 CLINICAL DATA:  Altered mental status EXAM: PORTABLE CHEST 1 VIEW COMPARISON:  January 20, 2017 FINDINGS: There is persistent cardiomegaly. There is pulmonary venous hypertension. There is no edema or consolidation. No adenopathy. No bone lesions. IMPRESSION: Pulmonary vascular congestion without edema or consolidation. Electronically Signed   By: Bretta Bang III M.D.   On: 02/24/2017 15:08  Procedures Procedures (including critical care  time)  Medications Ordered in ED Medications  cefTRIAXone (ROCEPHIN) injection 1 g (not administered)  sodium chloride 0.9 % bolus 500 mL (0 mLs Intravenous Stopped 02/24/17 1555)  fentaNYL (SUBLIMAZE) injection 100 mcg (100 mcg Intravenous Given 02/24/17 1443)  ondansetron (ZOFRAN) injection 4 mg (4 mg Intravenous Given 02/24/17 1443)  LORazepam (ATIVAN) injection 1 mg (1 mg Intravenous Given 02/24/17 1443)     Initial Impression / Assessment and Plan / ED Course  I have reviewed the triage vital signs and the nursing notes.  Pertinent labs & imaging results that were available during my care of the patient were reviewed by me and considered in my medical decision making (see chart for details).  Clinical Course as of Feb 24 1814  Tue Feb 24, 2017  1751 Abnormal; possible UTI Nitrite: (!) POSITIVE [EW]  1751 Low Hemoglobin: (!) 8.0 [EW]  1751 Abnormal; possible UTI Bacteria, UA: (!) MANY [EW]    Clinical Course User Index [EW] Mancel Bale, MD    Medications  cefTRIAXone (ROCEPHIN) injection 1 g (not administered)  sodium chloride 0.9 % bolus 500 mL (0 mLs Intravenous Stopped 02/24/17 1555)  fentaNYL (SUBLIMAZE) injection 100 mcg (100 mcg Intravenous Given 02/24/17 1443)  ondansetron (ZOFRAN) injection 4 mg (4 mg Intravenous Given 02/24/17 1443)  LORazepam (ATIVAN) injection 1 mg (1 mg Intravenous Given 02/24/17 1443)    Patient Vitals for the past 24 hrs:  BP Temp Temp src Pulse Resp SpO2  02/24/17 1730 (!) 142/87 - - 70 (!) 22 99 %  02/24/17 1715 129/80 - - 79 (!) 27 100 %  02/24/17 1630 (!) 127/50 - - 77 19 100 %  02/24/17 1555 - 100.2 F (37.9 C) Rectal - - -  02/24/17 1530 135/60 - - 76 17 100 %  02/24/17 1312 115/61 97.8 F (36.6 C) Oral 71 15 97 %    5:53 PM Reevaluation with update and discussion. After initial assessment and treatment, an updated evaluation reveals patient remains sedated from earlier treatment with Ativan and fentanyl, for pain and sedation.  His  sister is here now and states that at baseline he is moderately confused, but typically recognizes her.  She was updated on the findings and plan.Flint Melter   6:05 PM-Consult complete with hospitalist. Patient case explained and discussed.  She agrees to admit patient for further evaluation and treatment. Call ended at 18:25  Final Clinical Impressions(s) / ED Diagnoses   Final diagnoses:  Urinary tract infection associated with indwelling urethral catheter, initial encounter (HCC)  Altered mental status, unspecified altered mental status type  Anemia, unspecified type  Decubitus ulcer of sacral region, stage 4 (HCC)   Nonspecific altered mental status, likely secondary to use of narcotics for chronic pain.  Evaluation consistent with possible UTI, urinary culture ordered.  Ongoing sacral decubitus with mild skin breakdown in the right buttock.  This does not appear to be a source of infection.  Doubt sepsis.  History of cirrhosis, with normal ammonia.  Doubt hepatic encephalopathy.  Transient decreased responsiveness after treatment with fentanyl and Ativan in the ED. patient with anemia which is ongoing and felt to be chronic.  When in the ED 1 week ago his hemoglobin was 6.9 and he was treated with transfusion.  Hemoglobin improved since that time.  Nursing Notes Reviewed/ Care Coordinated Applicable Imaging Reviewed Interpretation of Laboratory Data incorporated into ED treatment  Plan: Admit  New Prescriptions New Prescriptions   No medications on  file     Mancel BaleElliott Detra Bores, MD 02/24/17 [phone]

## 2017-02-24 NOTE — Progress Notes (Signed)
Pharmacy Antibiotic Note  Jesus [name] is a 71 y.o. male admitted on 02/24/2017 with sepsis.  Pharmacy has been consulted for vancomycin and zosyn dosing.   On admission, patient was afebrile with WBC and lactic acid within normal limits. His serum creatinine is around baseline, w/ nCrCL ~70 mL/min.   Plan: Vancomycin 2000mg  IV x1, then 1000mg  every 12 hours Zosyn 3.375gm IV every 8 hours Monitor renal function, clinical progress, VT as indicated  Temp (24hrs), Avg:99 F (37.2 C), Min:97.8 F (36.6 C), Max:100.2 F (37.9 C)   Recent Labs Lab 02/24/17 1421 02/24/17 1950  WBC 8.4  --   CREATININE 0.96  --   LATICACIDVEN  --  1.19    Estimated Creatinine Clearance: 99.5 mL/min (by C-G formula based on SCr of 0.96 mg/dL).    Allergies  Allergen Reactions  . Amlodipine Besylate Swelling and Other (See Comments)    "started off low and ended up severe; if, in fact, that is what's causing the swelling" (06/03/12)  . Levaquin [Levofloxacin In D5w] Rash    Antimicrobials this admission: 3/20 vancomycin >>  3/20 zosyn >>   Thank you for allowing pharmacy to be a part of this patient's care.  Carylon PerchesMaggie Shuda, PharmD Acute Care Pharmacy Resident  Pager: [phone] 02/24/2017

## 2017-02-24 NOTE — ED Triage Notes (Signed)
[facility] because they were unable to arouse him this AM. EMS stated that he woke up when they transferred him to their stretcher. He has a sore on his coccyx and on his L heel. States he lives in chronic pain and yelling out is his baseline.

## 2017-02-25 ENCOUNTER — Encounter (HOSPITAL_COMMUNITY): Payer: Self-pay

## 2017-02-25 DIAGNOSIS — Z7189 Other specified counseling: Secondary | ICD-10-CM

## 2017-02-25 DIAGNOSIS — R4182 Altered mental status, unspecified: Secondary | ICD-10-CM

## 2017-02-25 DIAGNOSIS — L89150 Pressure ulcer of sacral region, unstageable: Secondary | ICD-10-CM

## 2017-02-25 DIAGNOSIS — Z515 Encounter for palliative care: Secondary | ICD-10-CM

## 2017-02-25 DIAGNOSIS — L03312 Cellulitis of back [any part except buttock]: Secondary | ICD-10-CM

## 2017-02-25 DIAGNOSIS — G9340 Encephalopathy, unspecified: Secondary | ICD-10-CM

## 2017-02-25 LAB — COMPREHENSIVE METABOLIC PANEL
ALK PHOS: 141 U/L — AB (ref 38–126)
ALT: 15 U/L — AB (ref 17–63)
AST: 17 U/L (ref 15–41)
Albumin: 1.8 g/dL — ABNORMAL LOW (ref 3.5–5.0)
Anion gap: 9 (ref 5–15)
BILIRUBIN TOTAL: 0.8 mg/dL (ref 0.3–1.2)
BUN: 15 mg/dL (ref 6–20)
CALCIUM: 8.4 mg/dL — AB (ref 8.9–10.3)
CO2: 30 mmol/L (ref 22–32)
CREATININE: 0.85 mg/dL (ref 0.61–1.24)
Chloride: 101 mmol/L (ref 101–111)
Glucose, Bld: 100 mg/dL — ABNORMAL HIGH (ref 65–99)
Potassium: 3.7 mmol/L (ref 3.5–5.1)
Sodium: 140 mmol/L (ref 135–145)
TOTAL PROTEIN: 5.5 g/dL — AB (ref 6.5–8.1)

## 2017-02-25 LAB — MAGNESIUM: Magnesium: 1.9 mg/dL (ref 1.7–2.4)

## 2017-02-25 LAB — CBC
HCT: 29.3 % — ABNORMAL LOW (ref 39.0–52.0)
Hemoglobin: 8.5 g/dL — ABNORMAL LOW (ref 13.0–17.0)
MCH: 27.8 pg (ref 26.0–34.0)
MCHC: 29 g/dL — ABNORMAL LOW (ref 30.0–36.0)
MCV: 95.8 fL (ref 78.0–100.0)
PLATELETS: 284 10*3/uL (ref 150–400)
RBC: 3.06 MIL/uL — AB (ref 4.22–5.81)
RDW: 18.5 % — ABNORMAL HIGH (ref 11.5–15.5)
WBC: 10.3 10*3/uL (ref 4.0–10.5)

## 2017-02-25 LAB — BLOOD GAS, ARTERIAL
ACID-BASE EXCESS: 7.7 mmol/L — AB (ref 0.0–2.0)
Acid-Base Excess: 6.9 mmol/L — ABNORMAL HIGH (ref 0.0–2.0)
Bicarbonate: 34.7 mmol/L — ABNORMAL HIGH (ref 20.0–28.0)
Bicarbonate: 32.9 mmol/L — ABNORMAL HIGH (ref 20.0–28.0)
DELIVERY SYSTEMS: POSITIVE
DRAWN BY: 406621
Drawn by: 313061
Expiratory PAP: 5
FIO2: 30
INSPIRATORY PAP: 12
O2 Content: 3 L/min
O2 Saturation: 96.7 %
O2 Saturation: 98.7 %
PCO2 ART: 57.9 mmHg — AB (ref 32.0–48.0)
PH ART: 7.2 — AB (ref 7.350–7.450)
PO2 ART: 105 mmHg (ref 83.0–108.0)
Patient temperature: 98.6
Patient temperature: 98.6
pCO2 arterial: 92.3 mmHg (ref 32.0–48.0)
pH, Arterial: 7.373 (ref 7.350–7.450)
pO2, Arterial: 118 mmHg — ABNORMAL HIGH (ref 83.0–108.0)

## 2017-02-25 LAB — C DIFFICILE QUICK SCREEN W PCR REFLEX
C DIFFICILE (CDIFF) INTERP: NOT DETECTED
C DIFFICLE (CDIFF) ANTIGEN: NEGATIVE
C Diff toxin: NEGATIVE

## 2017-02-25 LAB — MRSA PCR SCREENING: MRSA by PCR: NEGATIVE

## 2017-02-25 MED ORDER — SODIUM CHLORIDE 0.9 % IV BOLUS (SEPSIS)
500.0000 mL | Freq: Once | INTRAVENOUS | Status: AC
  Administered 2017-02-25: 500 mL via INTRAVENOUS

## 2017-02-25 MED ORDER — ORAL CARE MOUTH RINSE
15.0000 mL | Freq: Two times a day (BID) | OROMUCOSAL | Status: DC
  Administered 2017-02-25 – 2017-03-17 (×30): 15 mL via OROMUCOSAL

## 2017-02-25 MED ORDER — ENOXAPARIN SODIUM 40 MG/0.4ML ~~LOC~~ SOLN
40.0000 mg | SUBCUTANEOUS | Status: DC
  Administered 2017-02-25: 40 mg via SUBCUTANEOUS
  Filled 2017-02-25: qty 0.4

## 2017-02-25 NOTE — Progress Notes (Signed)
Pt removed from BiPAP.  Tolerating Room air well.

## 2017-02-25 NOTE — Progress Notes (Signed)
CRITICAL VALUE ALERT  Critical value received: PCO2 92.3  Date of notification:  02/25/17  Time of notification:  0930  Critical value read back:Yes.    Nurse who received alert:  Talene Glastetter  MD notified (1st page):  Dr. Susie CassetteAbrol  Time of first page:  0935  MD notified (2nd page):  Time of second page:  Responding MD:  Dr. Susie CassetteAbrol  Time MD responded:  [phone]

## 2017-02-25 NOTE — Progress Notes (Signed)
[facility] PROGRESS NOTE  Jesus [name] ONG:[id number] DOB: 06/23/1946 DOA: 02/24/2017   PCP: Jesus KinsAVIS,[name], [name]     Assessment/Plan: Principal Problem:   Acute encephalopathy Active Problems:   Hypertension   Peripheral vascular disease (HCC)   Chronic venous insufficiency   Peripheral edema   Cellulitis   Chronic diastolic CHF (congestive heart failure) (HCC)   Decubitus ulcer of sacral region, unstageable (HCC)    71 y.o. gentleman with a history of chronic atrial fibrillation (anticoagulated with Eliquis; CHADS-Vasc score of 4), CKD 3, chronic diastolic heart failure, HTN, chronic anemia, chronic pain, chronic venous insufficiency, chronic edema, cirrhosis of the liver by CT, incidental lesions in his liver and pancreas seen on prior CT imaging, and recent admission from 1/22-2/16 for infected sacral decubitus ulcer requiring surgical debridement and aspiration pneumonia.  The wound ultimately required a wound vac and [facility]  According to his daughter, he did well there, but has declined quickly [facility]. Patient found to have altered mental status, probable sepsis, encephalopathy, started on broad-spectrum antibiotics, admitted to step down.  Assessment and plan  Acute hypoxic hypercarbic respiratory failure In the setting of sepsis, chest x-ray consistent with pulmonary vascular congestion, edema Cannot rule out aspiration Patient initiated on BiPAP after discussion with the patient's daughter If no improvement in 24 hours daughter is willing to consider comfort care measures Patient will remain NPO , pending improvement in his mental status Serial ABG   Probable sepsis secondary to catheter associated UTI as well as  cellulitis and superimposed infection involving chronic nonhealing sacral decubitus ulcer, both present on admission. Continue vanc and zosyn --Blood and urine cultures pending --Wound  consult pending, hopefully he will not need further debridement  Acute metabolic encephalopathy , and the setting of probable sepsis, respiratory failure --Monitor in the stepdown unit on BiPAP --All oral medication on hold for now; if mental status not improved by morning, will need to address alternative anticoagulation --Patient's sister refused head CT as part of work-up for mental status changes  Subtherapeutic depakote level --Oral depakote dosing converted to IV 250mg  BID for now --Follow levels  Hepatic coagulopathic vs DIC ; INR 1.7    Hypotensive shock resuscitated with IV fluids --NS bolus prn, not a candidate for vasopressors --Oral BP meds on hold  Chronic atrial fibrillation --Currently rate controlled Unable to take Eliquis if not tolerating PO by AM History a candidate for long-term anticoagulation  Diarrhea Will place flexiseal, r/o C. difficile  Chronic diastolic heart failure, compensated  Chronic venous stasis, edema, foot ulcers.  I did not see (sister objected).  Defer to new attending and wound care team in the AM.  Overall prognosis is poor.  Daughter is upholding DNR/DNI status at this time.     DVT prophylaxsis Lovenox  Code Status:  DNR     Family Communication: Discussed in detail with the patient's daughter who is the POA, all imaging results, lab results explained to the patient   Disposition Plan:   Palliative care consult and transition to full comfort care if no improvement in 24 hours      Consultants:  Palliative     Procedures:  None   Antibiotics: Anti-infectives    Start     Dose/Rate Route Frequency Ordered Stop   02/25/17 0800  vancomycin (VANCOCIN) IVPB 1000 mg/200 mL premix     1,000 mg 200 mL/hr over 60  Minutes Intravenous Every 12 hours 02/24/17 2102     02/25/17 0600  piperacillin-tazobactam (ZOSYN) IVPB 3.375 g     3.375 g 12.5 mL/hr over 240 Minutes Intravenous Every 8 hours 02/24/17 2102      02/24/17 1930  vancomycin (VANCOCIN) 2,000 mg in sodium chloride 0.9 % 500 mL IVPB     2,000 mg 250 mL/hr over 120 Minutes Intravenous  Once 02/24/17 1915 02/24/17 2201   02/24/17 1915  piperacillin-tazobactam (ZOSYN) IVPB 3.375 g     3.375 g 100 mL/hr over 30 Minutes Intravenous  Once 02/24/17 1907 02/24/17 2359   02/24/17 1915  vancomycin (VANCOCIN) IVPB 1000 mg/200 mL premix  Status:  Discontinued     1,000 mg 200 mL/hr over 60 Minutes Intravenous  Once 02/24/17 1907 02/24/17 1915   02/24/17 1815  cefTRIAXone (ROCEPHIN) injection 1 g  Status:  Discontinued     1 g Intramuscular  Once 02/24/17 1801 02/24/17 1907         HPI/Subjective:  Obtunded, having diarrhea, hypotensive overnight, received 1 fluid bolus this morning  Objective: Vitals:   02/25/17 0426 02/25/17 0728 02/25/17 0800 02/25/17 0936  BP: 93/60 (!) 89/71 (!) 105/48 (!) 152/132  Pulse: 86 81 71 66  Resp: (!) 23 (!) 32 (!) 27 (!) 33  Temp: 99.4 F (37.4 C) 98.6 F (37 C)    TempSrc: Oral Axillary    SpO2: 100% 96% 98% 100%  Weight:      Height:        Intake/Output Summary (Last 24 hours) at 02/25/17 0942 Last data filed at 02/25/17 0729  Gross per 24 hour  Intake           2085.5 ml  Output              600 ml  Net           1485.5 ml    Exam:  Cardiovascular: Irregular but rate controlled.  Trace pretibial edema bilaterally.    GI: abdomen is obese but soft and compressible.  No distention.  Bowel sounds are present. Musculoskeletal:  No joint deformity in upper and lower extremities. Moves extremities spontaneously.  No apparent contractures. Normal muscle tone.  Skin: Patient's sister refused to let me remove bilateral leg dressings in the ED, stating that "the problem is with his back".  Sacral wound dressing completely saturated with malodorous purulent discharge.  The ulcer in his back (which appears to go down to bone) still appears clean based but there is a 3-4cm rim of erythematous skin around  the actually opening of the ulcer that is worrisome for new/progressive cellulitic changes and eventual necrosis. Neurologic: Unable to obtain due to altered mental status.   Data Reviewed: I have personally reviewed following labs and imaging studies  Micro Results Recent Results (from the past 240 hour(s))  Culture, blood (routine x 2)     Status: None (Preliminary result)   Collection Time: 02/24/17  4:02 PM  Result Value Ref Range Status   Specimen Description BLOOD LEFT ANTECUBITAL  Final   Special Requests BOTTLES DRAWN AEROBIC ONLY 10CC  Final   Culture PENDING  Incomplete   Report Status PENDING  Incomplete  MRSA PCR Screening     Status: None   Collection Time: 02/24/17  9:34 PM  Result Value Ref Range Status   MRSA by PCR NEGATIVE NEGATIVE Final    Comment:        The GeneXpert MRSA Assay (FDA approved for NASAL  specimens only), is one component of a comprehensive MRSA colonization surveillance program. It is not intended to diagnose MRSA infection nor to guide or monitor treatment for MRSA infections.     Radiology Reports Dg Chest Port 1 View  Result Date: 02/24/2017 CLINICAL DATA:  Altered mental status EXAM: PORTABLE CHEST 1 VIEW COMPARISON:  January 20, 2017 FINDINGS: There is persistent cardiomegaly. There is pulmonary venous hypertension. There is no edema or consolidation. No adenopathy. No bone lesions. IMPRESSION: Pulmonary vascular congestion without edema or consolidation. Electronically Signed   By: Bretta Bang III M.D.   On: 02/24/2017 15:08     CBC  Recent Labs Lab 02/24/17 1421 02/25/17 0214  WBC 8.4 10.3  HGB 8.0* 8.5*  HCT 26.2* 29.3*  PLT 344 284  MCV 93.2 95.8  MCH 28.5 27.8  MCHC 30.5 29.0*  RDW 18.4* 18.5*  LYMPHSABS 0.8  --   MONOABS 0.7  --   EOSABS 0.8*  --   BASOSABS 0.0  --     Chemistries   Recent Labs Lab 02/24/17 1421 02/25/17 0214  NA 142 140  K 3.5 3.7  CL 99* 101  CO2 33* 30  GLUCOSE 97 100*  BUN 17  15  CREATININE 0.96 0.85  CALCIUM 8.8* 8.4*  MG  --  1.9  AST 24 17  ALT 19 15*  ALKPHOS 165* 141*  BILITOT 0.9 0.8   ------------------------------------------------------------------------------------------------------------------ estimated creatinine clearance is 108 mL/min (by C-G formula based on SCr of 0.85 mg/dL). ------------------------------------------------------------------------------------------------------------------ No results for input(s): HGBA1C in the last 72 hours. ------------------------------------------------------------------------------------------------------------------ No results for input(s): CHOL, HDL, LDLCALC, TRIG, CHOLHDL, LDLDIRECT in the last 72 hours. ------------------------------------------------------------------------------------------------------------------ No results for input(s): TSH, T4TOTAL, T3FREE, THYROIDAB in the last 72 hours.  Invalid input(s): FREET3 ------------------------------------------------------------------------------------------------------------------ No results for input(s): VITAMINB12, FOLATE, FERRITIN, TIBC, IRON, RETICCTPCT in the last 72 hours.  Coagulation profile  Recent Labs Lab 02/24/17 2109  INR 1.71    No results for input(s): DDIMER in the last 72 hours.  Cardiac Enzymes No results for input(s): CKMB, TROPONINI, MYOGLOBIN in the last 168 hours.  Invalid input(s): CK ------------------------------------------------------------------------------------------------------------------ Invalid input(s): POCBNP   CBG: No results for input(s): GLUCAP in the last 168 hours.     Studies: Dg Chest Port 1 View  Result Date: 02/24/2017 CLINICAL DATA:  Altered mental status EXAM: PORTABLE CHEST 1 VIEW COMPARISON:  January 20, 2017 FINDINGS: There is persistent cardiomegaly. There is pulmonary venous hypertension. There is no edema or consolidation. No adenopathy. No bone lesions. IMPRESSION: Pulmonary  vascular congestion without edema or consolidation. Electronically Signed   By: Bretta Bang III M.D.   On: 02/24/2017 15:08      Lab Results  Component Value Date   HGBA1C 6.8 (H) 06/02/2012   Lab Results  Component Value Date   LDLCALC 98 04/02/2015   CREATININE 0.85 02/25/2017       Scheduled Meds: . mouth rinse  15 mL Mouth Rinse BID  . piperacillin-tazobactam (ZOSYN)  IV  3.375 g Intravenous Q8H  . sodium chloride flush  3 mL Intravenous Q12H  . valproate sodium  250 mg Intravenous Q12H  . vancomycin  1,000 mg Intravenous Q12H   Continuous Infusions:   LOS: 1 day    Time spent: >30 MINS    [facility]  [facility] Pager [phone]. If 7PM-7AM, please contact night-coverage at [email or web address], password [facility] 02/25/2017, 9:42 AM  LOS: 1 day

## 2017-02-25 NOTE — Consult Note (Signed)
Consultation Note Date: 02/25/2017   Patient Name: Jesus [name]  DOB: 06-21-1946  MRN: [id number]  Age / Sex: 71 y.o., male  PCP: Adron Bene, [name] Referring Physician: Reyne Dumas, MD  Reason for Consultation: Establishing goals of care with complicated wounds that are unlikely to heal and multiple admission. Declining health and family struggling. Palliative asked to help with GOC.   HPI/Patient Profile: 71 y.o. male  with past medical history of multiple complicated nonhealing decubitus ulcers with recent admission requiring surgical debridement complicated by aspiration pneumonica, Atrial fibrillation on Eliquis, diastolic heart failure, HTN, chronic anemia, chronic venous insufficiency, chronic edema, liver cirrhosis per CT, lesions in liver and pancreas per CT (thought to be cyst vs pseudocyst) admitted on 02/24/2017 with hypoxia followed by AMS and hypercarbia. Went to [facility] from recent admission and just recently returned to SNF [facility]). Family says he did very well in [place] and wounds were controlled well but very unhappy as they feel his decline has been just since he has been back in SNF.   Clinical Assessment and Goals of Care: I met today with Jesus [name] who is pleasant but very confused and does not understand why he needed BiPAP or why we were hurting him (required multiple clean up and dressing changes d/t diarrhea). He is yelling out at times and unclear if this is pain or anxiety because he does not understand what is going on.   I did speak with his sister, [name], as well as daughter and Chauncey Reading, [name]. Jonette Eva is very frustrated that he is back in the hospital and in this position again. She feels that this could have been avoided d/t poor care at [facility]. We did review his low albumin and poor health state in which he will easily have infections and that I fear this may be a cycle we  will not be able to break. [name] seems to understand his poor health. [facility] understands but continues to be very hopeful that he can return to baseline and to optimize his health.   We decided to continue with current care to optimize, work on a better pain management plan prior to discharge (I wonder how much sedation from medication caused his hypercarbia), and work with CSW for options at discharge (they hope for LTAC but I doubt they will approve again - they are aware of this). Emotional support provided.   Primary Decision Maker HCPOA daughter [name]    SUMMARY OF RECOMMENDATIONS   - Family/especially HCPOA continue to remain hopeful for improvement and frustrated with continued decline and complications - We will continue to follow, discuss, and support  Code Status/Advance Care Planning:  DNR   Symptom Management:   Chronic pain r/t wounds: Consider tramadol prn. He has dilaudid 2 mg on his home list - I believe this may be too strong for him. Will follow for recs during hospitalization.   Palliative Prophylaxis:   Aspiration, Delirium Protocol, Frequent Pain Assessment, Oral Care and Turn Reposition  Additional Recommendations (Limitations, Scope, Preferences):  Full Scope Treatment  Psycho-social/Spiritual:   Desire for further Chaplaincy support:no  Additional Recommendations: Caregiving  Support/Resources and Education on Hospice  Prognosis:   Unable to determine but likely poor with multiple comorbitities and poor chance of improving  Discharge Planning: To Be Determined      Primary Diagnoses: Present on Admission: . Acute encephalopathy . Chronic venous insufficiency . Chronic diastolic CHF (congestive heart failure) ([place]) . Hypertension . Peripheral vascular disease ([place]) . Peripheral edema . Cellulitis . Decubitus ulcer of sacral region, unstageable ([place])   I have reviewed the medical record, interviewed the patient and family, and examined the  patient. The following aspects are pertinent.  Past Medical History:  Diagnosis Date  . Acute on chronic renal failure ([place])   . Arthritis    "normal for my age" (11/13/2016)  . Cellulitis and abscess of leg 11/2016  . Chronic anemia   . Chronic atrial fibrillation ([place])   . Chronic diastolic CHF (congestive heart failure) ([place]) 12/2002   a. EF 50-55% by echo 04/2012  . Decubitus ulcer of buttock, unstageable ([place]) 12/31/2016  . Edema of lower extremity   . Gout   . H/O: GI bleed    "cause I took too much aspirin"  . High cholesterol   . Hyperglycemia    Noted 05/2012  . Hypertension   . Morbid obesity ([place])   . PVD (peripheral vascular disease) ([place])   . Venous stasis ulcer ([place])    Social History   Social History  . Marital status: Divorced    Spouse name: N/A  . Number of children: N/A  . Years of education: N/A   Occupational History  . Retired    Social History Main Topics  . Smoking status: Former Smoker    Types: Cigarettes    Quit date: 12/08/1965  . Smokeless tobacco: Never Used     Comment: "social cigarette smoker in my late teens"  . Alcohol use 8.4 oz/week    14 Cans of beer per week  . Drug use: No  . Sexual activity: No   Other Topics Concern  . None   Social History Narrative   Patient lives alone.   Family History  Problem Relation Age of Onset  . Arrhythmia Father   . Hypertension Father   . Cancer Mother     cervical   Scheduled Meds: . enoxaparin (LOVENOX) injection  40 mg Subcutaneous Q24H  . mouth rinse  15 mL Mouth Rinse BID  . piperacillin-tazobactam (ZOSYN)  IV  3.375 g Intravenous Q8H  . sodium chloride flush  3 mL Intravenous Q12H  . valproate sodium  250 mg Intravenous Q12H  . vancomycin  1,000 mg Intravenous Q12H   Continuous Infusions: PRN Meds:.acetaminophen **OR** acetaminophen, ondansetron **OR** ondansetron (ZOFRAN) IV Allergies  Allergen Reactions  . Amlodipine Besylate Swelling and Other (See Comments)    "started  off low and ended up severe; if, in fact, that is what's causing the swelling" (06/03/12)  . Levaquin [Levofloxacin In D5w] Rash   Review of Systems  Unable to perform ROS: Dementia    Physical Exam  Constitutional: He appears well-developed.  HENT:  Head: Normocephalic and atraumatic.  Cardiovascular: Normal rate.  An irregularly irregular rhythm present.  Pulmonary/Chest: Effort normal. No accessory muscle usage. No tachypnea. No respiratory distress. He has decreased breath sounds.  Decreased likely d/t large body habitus   Abdominal: Soft. Normal appearance.  Neurological: He is alert. He is disoriented.  Psychiatric: He is agitated.  He expresses impulsivity.  Nursing note and vitals reviewed.   Vital Signs: BP (!) 152/132   Pulse 66   Temp 98.6 F (37 C) (Axillary)   Resp (!) 33   Ht 5' 10"  (1.778 m)   Wt 126.6 kg (279 lb)   SpO2 100%   BMI 40.03 kg/m  Pain Assessment: PAINAD POSS *See Group Information*: 1-Acceptable,Awake and alert Pain Score: 0-No pain   SpO2: SpO2: 100 % O2 Device:SpO2: 100 % O2 Flow Rate: .O2 Flow Rate (L/min): 2 L/min  IO: Intake/output summary:  Intake/Output Summary (Last 24 hours) at 02/25/17 1405 Last data filed at 02/25/17 6767  Gross per 24 hour  Intake           2085.5 ml  Output              600 ml  Net           1485.5 ml    LBM:   Baseline Weight: Weight: 126.6 kg (279 lb) Most recent weight: Weight: 126.6 kg (279 lb)     Palliative Assessment/Data:   Flowsheet Rows     Most Recent Value  Intake Tab  Referral Department  Hospitalist  Unit at Time of Referral  Intermediate Care Unit  Palliative Care Primary Diagnosis  Cardiac  Date Notified  02/25/17  Palliative Care Type  Return patient Palliative Care  Reason for referral  Clarify Goals of Care  Date of Admission  02/24/17  # of days IP prior to Palliative referral  1  Clinical Assessment  Psychosocial & Spiritual Assessment  Palliative Care Outcomes        Time Total: 33mn  Greater than 50%  of this time was spent counseling and coordinating care related to the above assessment and plan.  Signed by: AVinie Sill NP Palliative Medicine Team Pager # [phone](M-F 8a-5p) Team Phone # [phone](Nights/Weekends)

## 2017-02-25 NOTE — Progress Notes (Signed)
Pt is now more alert after initiation of BIPAP and is now repeatedly removing the mask and demanding for some water. Educated patient on need for continued use of the BIPAP bur he continues to remove mask. Dr. Susie CassetteAbrol notified with new orders received. Will implement and continue to monitor

## 2017-02-25 NOTE — Consult Note (Signed)
WOC Nurse wound consult note Reason for Consult: Right leg and sacral wounds Wound type:stage IV sacrum, partial thickness buttocks, bilateral pretibial and calves, left lat foot, left inner thigh. Unstageable to bilateral heels, and  lateral plantar surface of R foot, DTI to L medial heel Pressure Injury POA: Yes Measurement:Sacrum 15cm x 18cm x 6cm deep stage 4, 40% black, 40% white and yellow slough, 20% pale pink, serosanguinous drainage, no odor detected however pt continually stooling. Buttocks with multiple various size partial thickness wounds, moist, pale pink beds, no drainage or odor noted.  Unstageable on Lateral  plantar surface of R foot 1.5cm x 2cm x 0cm 100% black, Unstageable R heel 2.5cm x 6cm x 0cm R lateral calf 6cm x 3cm x 0.1cm partial thickness 50% yellow slough, 50 pale pink, slight drainage. DTI to area just proximal to right ankle 2.5cm x 0.5cm x 0cm dark purple. Two intact blisters to inner left thigh 6cm x 1.5cm and 3cm x 1cm. Left lateral calf 6cm x 3cm x 0.1cm partial thickness, pink, one intact blister 2.5cm x 1cm and two 2cm x 1cm partial thickness, and one 3cm sickle shaped all with pink beds. Left medial heel 2.5cm x 2.5cm DTI, purple.Left lateral foot 4cm x 4cm and a 2cm x 4cm partial thickness looks like healing from a ruptured blister, pink bed. Left posterior calf with 4cm x 3cm x 0cm healing from a ruptured blister, pale pink bed, no drainage or odor. Wound bed:see above Drainage (amount, consistency, odor) see above Periwound:a lot of dry scaly skin on BLE Dressing procedure/placement/frequency: Patient has a Palliative care consult placed, he is a DNR. I have provided nurses with orders for NS moistened W to D dressing BID for sacral ulcer. Xeroform and foam to wounds on heels, foam to intact blisters, xeroform and gauze to partial thickness on BLE calves. Pt is already on a specialty bed, I have ordered for pt to have a low air loss bed at time of transferring out  of the unit. Pt already has pressure alleviating boots on. Nutritional consult may be prudent for wound healing. Legs do not have any swelling at this time so I did not order 4 layer wraps at this time. Bed placed back in low position with 3 side rails up, call bell in place on bed. We will not follow, but will remain available to this patient, to nursing, and the medical and/or surgical teams.  Please re-consult if we need to assist further.    Barnett HatterMelinda Martavius Lusty, RN-C, WTA-C Wound Treatment Associate

## 2017-02-26 ENCOUNTER — Inpatient Hospital Stay (HOSPITAL_COMMUNITY): Payer: Medicare HMO

## 2017-02-26 DIAGNOSIS — Z515 Encounter for palliative care: Secondary | ICD-10-CM

## 2017-02-26 DIAGNOSIS — L03319 Cellulitis of trunk, unspecified: Secondary | ICD-10-CM

## 2017-02-26 DIAGNOSIS — Z7189 Other specified counseling: Secondary | ICD-10-CM

## 2017-02-26 LAB — COMPREHENSIVE METABOLIC PANEL
ALT: 12 U/L — ABNORMAL LOW (ref 17–63)
AST: 14 U/L — ABNORMAL LOW (ref 15–41)
Albumin: 1.7 g/dL — ABNORMAL LOW (ref 3.5–5.0)
Alkaline Phosphatase: 132 U/L — ABNORMAL HIGH (ref 38–126)
Anion gap: 11 (ref 5–15)
BILIRUBIN TOTAL: 1.3 mg/dL — AB (ref 0.3–1.2)
BUN: 15 mg/dL (ref 6–20)
CHLORIDE: 102 mmol/L (ref 101–111)
CO2: 30 mmol/L (ref 22–32)
Calcium: 8.5 mg/dL — ABNORMAL LOW (ref 8.9–10.3)
Creatinine, Ser: 1.09 mg/dL (ref 0.61–1.24)
Glucose, Bld: 81 mg/dL (ref 65–99)
Potassium: 3.2 mmol/L — ABNORMAL LOW (ref 3.5–5.1)
Sodium: 143 mmol/L (ref 135–145)
Total Protein: 5.1 g/dL — ABNORMAL LOW (ref 6.5–8.1)

## 2017-02-26 LAB — BLOOD GAS, ARTERIAL
ACID-BASE EXCESS: 8.5 mmol/L — AB (ref 0.0–2.0)
BICARBONATE: 32.7 mmol/L — AB (ref 20.0–28.0)
DRAWN BY: 364961
FIO2: 21
O2 SAT: 89.8 %
PATIENT TEMPERATURE: 99.2
pCO2 arterial: 47.8 mmHg (ref 32.0–48.0)
pH, Arterial: 7.452 — ABNORMAL HIGH (ref 7.350–7.450)
pO2, Arterial: 60 mmHg — ABNORMAL LOW (ref 83.0–108.0)

## 2017-02-26 LAB — URINE CULTURE: Culture: >=100000 — AB

## 2017-02-26 LAB — CBC
HCT: 22.2 % — ABNORMAL LOW (ref 39.0–52.0)
Hemoglobin: 6.6 g/dL — CL (ref 13.0–17.0)
MCH: 28.2 pg (ref 26.0–34.0)
MCHC: 29.7 g/dL — ABNORMAL LOW (ref 30.0–36.0)
MCV: 94.9 fL (ref 78.0–100.0)
PLATELETS: 276 10*3/uL (ref 150–400)
RBC: 2.34 MIL/uL — AB (ref 4.22–5.81)
RDW: 18.2 % — AB (ref 11.5–15.5)
WBC: 7.5 10*3/uL (ref 4.0–10.5)

## 2017-02-26 LAB — PREPARE RBC (CROSSMATCH)

## 2017-02-26 LAB — PROTIME-INR
INR: 1.7
PROTHROMBIN TIME: 20.2 s — AB (ref 11.4–15.2)

## 2017-02-26 MED ORDER — APIXABAN 5 MG PO TABS
5.0000 mg | ORAL_TABLET | Freq: Two times a day (BID) | ORAL | Status: DC
  Administered 2017-02-26 – 2017-03-13 (×29): 5 mg via ORAL
  Filled 2017-02-26 (×19): qty 1
  Filled 2017-02-26: qty 2
  Filled 2017-02-26 (×11): qty 1

## 2017-02-26 MED ORDER — ENSURE ENLIVE PO LIQD
237.0000 mL | Freq: Two times a day (BID) | ORAL | Status: DC
  Administered 2017-02-28 – 2017-03-17 (×25): 237 mL via ORAL

## 2017-02-26 MED ORDER — SODIUM CHLORIDE 0.9% FLUSH
10.0000 mL | INTRAVENOUS | Status: DC | PRN
  Administered 2017-03-13: 20 mL
  Administered 2017-03-14: 10 mL
  Filled 2017-02-26 (×2): qty 40

## 2017-02-26 MED ORDER — VANCOMYCIN HCL IN DEXTROSE 1-5 GM/200ML-% IV SOLN
1000.0000 mg | INTRAVENOUS | Status: DC
  Administered 2017-02-26: 1000 mg via INTRAVENOUS
  Filled 2017-02-26 (×2): qty 200

## 2017-02-26 MED ORDER — SODIUM CHLORIDE 0.9 % IV SOLN: Freq: Once | INTRAVENOUS | Status: DC

## 2017-02-26 MED ORDER — PRO-STAT SUGAR FREE PO LIQD
30.0000 mL | Freq: Two times a day (BID) | ORAL | Status: DC
  Administered 2017-02-27 – 2017-03-17 (×34): 30 mL via ORAL
  Filled 2017-02-26 (×36): qty 30

## 2017-02-26 MED ORDER — SODIUM CHLORIDE 0.9 % IV SOLN
Freq: Once | INTRAVENOUS | Status: AC
  Administered 2017-02-26: 10 mL/h via INTRAVENOUS

## 2017-02-26 MED ORDER — PANTOPRAZOLE SODIUM 40 MG IV SOLR
40.0000 mg | Freq: Every day | INTRAVENOUS | Status: DC
  Administered 2017-02-26 – 2017-02-27 (×2): 40 mg via INTRAVENOUS
  Filled 2017-02-26 (×2): qty 40

## 2017-02-26 MED ORDER — POTASSIUM CHLORIDE CRYS ER 20 MEQ PO TBCR
40.0000 meq | EXTENDED_RELEASE_TABLET | Freq: Once | ORAL | Status: AC
  Administered 2017-02-26: 40 meq via ORAL
  Filled 2017-02-26: qty 2

## 2017-02-26 MED ORDER — FENTANYL CITRATE (PF) 100 MCG/2ML IJ SOLN
12.5000 ug | INTRAMUSCULAR | Status: DC | PRN
  Administered 2017-02-26 – 2017-02-27 (×4): 12.5 ug via INTRAVENOUS
  Filled 2017-02-26 (×4): qty 2

## 2017-02-26 MED ORDER — SODIUM CHLORIDE 0.9% FLUSH
10.0000 mL | Freq: Two times a day (BID) | INTRAVENOUS | Status: DC
Start: 2017-02-26 — End: 2017-03-17
  Administered 2017-02-26 – 2017-03-15 (×21): 10 mL

## 2017-02-26 NOTE — Progress Notes (Signed)
Pharmacy Antibiotic Note Jesus [name] is a 71 y.o. male admitted on 02/24/2017 with sepsis. Currently on day 3 of empiric therapy with Zosyn and vancomycin.   Plan: 1. Empirically adjust vancomycin to 1 gram IV every 24 hours based on previous dosing/levels in 01/2017 2. Zosyn 3.375gm IV every 8 hours 3. Would consider stopping IV vancomycin with providencia being isolated in urine culture  Temp (24hrs), Avg:98.8 F (37.1 C), Min:98 F (36.7 C), Max:100.2 F (37.9 C)   Recent Labs Lab 02/24/17 1421 02/24/17 1950 02/25/17 0214 02/26/17 0334  WBC 8.4  --  10.3 7.5  CREATININE 0.96  --  0.85 1.09  LATICACIDVEN  --  1.19  --   --     Estimated Creatinine Clearance: 84.2 mL/min (by C-G formula based on SCr of 1.09 mg/dL).    Allergies  Allergen Reactions  . Amlodipine Besylate Swelling and Other (See Comments)    "started off low and ended up severe; if, in fact, that is what's causing the swelling" (06/03/12)  . Levaquin [Levofloxacin In D5w] Rash    Antimicrobials this admission: 3/20 vancomycin >>  3/20 Zosyn >>   Pertinent Culture data:  3/20 blood x 2: ngtd 3/20 urine: providenca c/s pending  3/21 C.diff: negative   Thank you for allowing pharmacy to be a part of this patient's care.  Pollyann SamplesAndy Lankford Gutzmer, PharmD, BCPS 02/26/2017, 8:17 AM Phone# [phone]

## 2017-02-26 NOTE — Progress Notes (Signed)
RT NOTE:  ABG drawn, results reported to RN.

## 2017-02-26 NOTE — Progress Notes (Signed)
Initial Nutrition Assessment  DOCUMENTATION CODES:   Morbid obesity  INTERVENTION:    Ensure Enlive po BID, each supplement provides 350 kcal and 20 grams of protein   Prostat liquid protein po 30 ml BID with meals, each supplement provides 100 kcal, 15 grams protein  NUTRITION DIAGNOSIS:   Increased nutrient needs related to wound healing as evidenced by estimated needs  GOAL:   Patient will meet greater than or equal to 90% of their needs  MONITOR:   PO intake, Supplement acceptance, Labs, Weight trends, Skin, I & O's  REASON FOR ASSESSMENT:   Consult Assessment of nutrition requirement/status  ASSESSMENT:   71 yo Male with PMH of diastolic heart failure, HTN, chronic anemia, chronic pain, chronic venous insufficiency, chronic edema, cirrhosis of the liver by CT; admitted with altered mental status, probable sepsis, encephalopathy, started on broad-spectrum antibiotics.  Pt reports his appetite is "the same". Lunch meal untouched on tray table upon RD visit. S/p bedside swallow evaluation today; no s/s of dysphagia.  Would benefit from addition of nutrition supplements. Medications reviewed and include ABX. Labs reviewed.  Potassium 3.2 (L).  Nutrition focused physical exam completed.  No muscle or subcutaneous fat depletion noticed.  Diet Order:  Diet regular Room service appropriate? Yes; Fluid consistency: Thin  Skin:      Stage IV sacrum Partial thickness to buttocks, bilateral pretibial and calves, L lat foot, L inner thigh Unstageable to bilateral heels and lateral plantar surface of R foot DTI to L medial heel  Last BM:  3/21  Height:   Ht Readings from Last 1 Encounters:  02/24/17 5\' 10"  (1.778 m)   Weight:   Wt Readings from Last 1 Encounters:  02/24/17 279 lb (126.6 kg)   Ideal Body Weight:  75.4 kg  BMI:  Body mass index is 40.03 kg/m.  Estimated Nutritional Needs:   Kcal:  2100-2300  Protein:  115-125 gm  Fluid:  2.1-2.3  L  EDUCATION NEEDS:   No education needs identified at this time  Maureen ChattersKatie Lluvia Gwynne, RD, LDN Pager #: [phone] After-Hours Pager #: [phone]

## 2017-02-26 NOTE — Plan of Care (Signed)
Problem: Pain Managment: Goal: General experience of comfort will improve Outcome: Progressing Patient takes chronic pain medication at home. He was often yelling out for a family member who was not here and did complain of pain in his legs and butt. Unable to give patient his PO medications as he is NPO at this time. MD's did not want patient receiving his home medication or anything stronger than tylenol. Pain seemed relieved with frequent repositioning, although only briefly.  Problem: Physical Regulation: Goal: Ability to maintain clinical measurements within normal limits will improve Outcome: Progressing Patient still very confused and yelling out. Suspect this may be closer to his baseline, but family not present. Patient known to have dementia. ABG's checked this AM showed they had normalized. No need to be placed back on Bipap at this time.  Goal: Will remain free from infection Outcome: Progressing Patient receiving broad spectrum antibiotics for UTI.   Problem: Skin Integrity: Goal: Risk for impaired skin integrity will decrease Outcome: Progressing Patient with many pressure ulcers/ skin problems. Offloaded patient and he was turned. He is on an air mattress.   Problem: Tissue Perfusion: Goal: Risk factors for ineffective tissue perfusion will decrease Outcome: Progressing Patient was taking eliquis PO, but changed to lovenox while in hospital due to confusion and NPO status.

## 2017-02-26 NOTE — NC FL2 (Signed)
[place] MEDICAID FL2 LEVEL OF CARE SCREENING TOOL     IDENTIFICATION  Patient Name: Jesus [name] Birthdate: Apr 12, 1946 Sex: male Admission Date (Current Location): 02/24/2017  [facility] and [place] Number:  Producer, television/film/video and Address:  [facility], [address] [place], [place] [address]      Provider Number: [id number]  Attending Physician Name and Address:  Richarda Overlie, MD  Relative Name and Phone Number:       Current Level of Care: Hospital Recommended Level of Care: Skilled Nursing Facility Prior Approval Number:    Date Approved/Denied:   PASRR Number: [id number]  Discharge Plan: SNF    Current Diagnoses: Patient Active Problem List   Diagnosis Date Noted  . Goals of care, counseling/discussion   . Palliative care encounter   . Acute encephalopathy 02/24/2017  . Abnormal CXR   . Disorientation   . DNR (do not resuscitate)   . Palliative care by specialist   . Decubitus ulcer of sacral region, unstageable (HCC) 12/30/2016  . Delirium 12/30/2016  . Cellulitis of lower extremity   . Hyponatremia 07/15/2015  . Cellulitis of right lower extremity 07/15/2015  . Acute renal failure superimposed on stage 3 chronic kidney disease (HCC)   . Hypokalemia   . Chronic atrial fibrillation (HCC) 07/09/2015  . Chronic anemia 07/09/2015  . CHF exacerbation (HCC) 07/08/2015  . Chronic diastolic CHF (congestive heart failure) (HCC) 04/02/2015  . Chronic diastolic heart failure (HCC) 01/25/2015  . Cellulitis 01/17/2015  . Pulmonary vascular congestion   . Congestive heart disease (HCC)   . Peripheral edema   . Acute exacerbation of CHF (congestive heart failure) (HCC) 01/04/2015  . 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 Renal artery stenosis (HCC) 02/17/2014  . Peripheral vascular disease (HCC) 04/15/2013  . Chronic  venous insufficiency 04/15/2013  . Morbid obesity (HCC) 06/11/2012  . Acute on chronic diastolic CHF (congestive heart failure) (HCC) 06/02/2012  . Unspecified essential hypertension 05/18/2012  . Atrial fibrillation (HCC) 04/27/2012  . CHF (congestive heart failure) (HCC) 04/27/2012  . Hypertension 04/27/2012    Orientation RESPIRATION BLADDER Height & Weight     Self  Normal Incontinent, Indwelling catheter Weight: 279 lb (126.6 kg) Height:  5\' 10"  (177.8 cm)  BEHAVIORAL SYMPTOMS/MOOD NEUROLOGICAL BOWEL NUTRITION STATUS      Continent Diet (see DC summary)  AMBULATORY STATUS COMMUNICATION OF NEEDS Skin   Extensive Assist Verbally PU Stage and Appropriate Care, Other (Comment) (unstageable on sacrum with daily gauze/abdominal pad changes, unstageable on heel with daily gauze foam dressing changes)   PU Stage 2 Dressing: Daily (located on buttocks- foam dressing)                   Personal Care Assistance Level of Assistance  Bathing, Dressing Bathing Assistance: Maximum assistance   Dressing Assistance: Maximum assistance     Functional Limitations Info             SPECIAL CARE FACTORS FREQUENCY  PT (By licensed PT), OT (By licensed OT)     PT Frequency: 5/wk OT Frequency: 5/wk            Contractures      Additional Factors Info  Code Status, Allergies, Isolation Precautions Code Status Info: DNR Allergies Info: Amlodipine Besylate, Levaquin Levofloxacin In D5w     Isolation Precautions Info: MRSA     Current Medications (  02/26/2017):  This is the current hospital active medication list Current Facility-Administered Medications  Medication Dose Route Frequency Provider Last Rate Last Dose  . 0.9 %  sodium chloride infusion   Intravenous Once Leanne ChangKatherine P Schorr, NP      . 0.9 %  sodium chloride infusion   Intravenous Once Richarda OverlieNayana Abrol, MD      . acetaminophen (TYLENOL) tablet 650 mg  650 mg Oral Q6H PRN Michael LitterNikki Carter, MD       Or  . acetaminophen  (TYLENOL) suppository 650 mg  650 mg Rectal Q6H PRN Michael LitterNikki Carter, MD      . apixaban (ELIQUIS) tablet 5 mg  5 mg Oral BID Richarda OverlieNayana Abrol, MD      . fentaNYL (SUBLIMAZE) injection 12.5 mcg  12.5 mcg Intravenous Q2H PRN Richarda OverlieNayana Abrol, MD      . MEDLINE mouth rinse  15 mL Mouth Rinse BID Michael LitterNikki Carter, MD   15 mL at 02/25/17 2009  . ondansetron (ZOFRAN) tablet 4 mg  4 mg Oral Q6H PRN Michael LitterNikki Carter, MD       Or  . ondansetron [facility](ZOFRAN) injection 4 mg  4 mg Intravenous Q6H PRN Michael LitterNikki Carter, MD      . pantoprazole (PROTONIX) injection 40 mg  40 mg Intravenous QHS Richarda OverlieNayana Abrol, MD      . piperacillin-tazobactam (ZOSYN) IVPB 3.375 g  3.375 g Intravenous Q8H Michael LitterNikki Carter, MD   3.375 g at 02/26/17 0526  . potassium chloride SA (K-DUR,KLOR-CON) CR tablet 40 mEq  40 mEq Oral Once Richarda OverlieNayana Abrol, MD      . sodium chloride flush (NS) 0.9 % injection 10-40 mL  10-40 mL Intracatheter Q12H Nayana Abrol, MD      . sodium chloride flush (NS) 0.9 % injection 10-40 mL  10-40 mL Intracatheter PRN Richarda OverlieNayana Abrol, MD      . sodium chloride flush (NS) 0.9 % injection 3 mL  3 mL Intravenous Q12H Michael LitterNikki Carter, MD   3 mL at 02/25/17 2200  . valproate (DEPACON) 250 mg in dextrose 5 % 50 mL IVPB  250 mg Intravenous Q12H Michael LitterNikki Carter, MD   250 mg at 02/26/17 0849  . vancomycin (VANCOCIN) IVPB 1000 mg/200 mL premix  1,000 mg Intravenous Q24H Richarda OverlieNayana Abrol, MD         Discharge Medications: Please see discharge summary for a list of discharge medications.  Relevant Imaging Results:  Relevant Lab Results:   Additional Information SS#: [id number]  Burna SisUris, Alleyah Twombly H, LCSW

## 2017-02-26 NOTE — Progress Notes (Signed)
Daily Progress Note   Patient Name: Jesus [name]       Date: 02/26/2017 DOB: 03/11/1946  Age: 71 y.o. MRN#: [id number] Attending Physician: Richarda OverlieNayana Abrol, MD Primary Care Physician: [name],[name], [name] Admit Date: 02/24/2017  Reason for Consultation/Follow-up: Establishing goals of care with complicated wounds that are unlikely to heal and multiple admission. Declining health and family struggling. Palliative asked to help with GOC.   Subjective: Jesus [name] is much more alert but still a little confused - this is likely his baseline.   Length of Stay: 2  Current Medications: Scheduled Meds:  . sodium chloride   Intravenous Once  . sodium chloride   Intravenous Once  . apixaban  5 mg Oral BID  . mouth rinse  15 mL Mouth Rinse BID  . pantoprazole (PROTONIX) IV  40 mg Intravenous QHS  . piperacillin-tazobactam (ZOSYN)  IV  3.375 g Intravenous Q8H  . sodium chloride flush  10-40 mL Intracatheter Q12H  . sodium chloride flush  3 mL Intravenous Q12H  . valproate sodium  250 mg Intravenous Q12H  . vancomycin  1,000 mg Intravenous Q24H    Continuous Infusions:   PRN Meds: acetaminophen **OR** acetaminophen, fentaNYL (SUBLIMAZE) injection, ondansetron **OR** ondansetron (ZOFRAN) IV, sodium chloride flush  Physical Exam  Constitutional: He appears well-developed.  HENT:  Head: Normocephalic and atraumatic.  Cardiovascular: Normal rate.  An irregularly irregular rhythm present.  Pulmonary/Chest: Effort normal. No accessory muscle usage. No tachypnea. No respiratory distress.  Abdominal: Soft. Normal appearance.  Neurological: He is alert. He is disoriented.  Nursing note and vitals reviewed.           Vital Signs: BP 140/87   Pulse (!) 59   Temp 98.2 F (36.8 C) (Oral)    Resp (!) 22   Ht 5\' 10"  (1.778 m)   Wt 126.6 kg (279 lb)   SpO2 99%   BMI 40.03 kg/m  SpO2: SpO2: 99 % O2 Device: O2 Device: Not Delivered O2 Flow Rate: O2 Flow Rate (L/min): 2 L/min  Intake/output summary:  Intake/Output Summary (Last 24 hours) at 02/26/17 1343 Last data filed at 02/26/17 1300  Gross per 24 hour  Intake              458 ml  Output  775 ml  Net             -317 ml   LBM: Last BM Date: 02/25/17 Baseline Weight: Weight: 126.6 kg (279 lb) Most recent weight: Weight: 126.6 kg (279 lb)       Palliative Assessment/Data:    Flowsheet Rows     Most Recent Value  Intake Tab  Referral Department  Hospitalist  Unit at Time of Referral  Intermediate Care Unit  Palliative Care Primary Diagnosis  Cardiac  Date Notified  02/25/17  Palliative Care Type  Return patient Palliative Care  Reason for referral  Clarify Goals of Care  Date of Admission  02/24/17  # of days IP prior to Palliative referral  1  Clinical Assessment  Psychosocial & Spiritual Assessment  Palliative Care Outcomes      Patient Active Problem List   Diagnosis Date Noted  . Goals of care, counseling/discussion   . Palliative care encounter   . Acute encephalopathy 02/24/2017  . Abnormal CXR   . Disorientation   . DNR (do not resuscitate)   . Palliative care by specialist   . Decubitus ulcer of sacral region, unstageable (HCC) 12/30/2016  . Delirium 12/30/2016  . Cellulitis of lower extremity   . Hyponatremia 07/15/2015  . Cellulitis of right lower extremity 07/15/2015  . Acute renal failure superimposed on stage 3 chronic kidney disease (HCC)   . Hypokalemia   . Chronic atrial fibrillation (HCC) 07/09/2015  . Chronic anemia 07/09/2015  . CHF exacerbation (HCC) 07/08/2015  . Chronic diastolic CHF (congestive heart failure) (HCC) 04/02/2015  . Chronic diastolic heart failure (HCC) 01/25/2015  . Cellulitis 01/17/2015  . Pulmonary vascular congestion   . Congestive heart  disease (HCC)   . Peripheral edema   . Acute exacerbation of CHF (congestive heart failure) (HCC) 01/04/2015  . 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 Renal artery stenosis (HCC) 02/17/2014  . Peripheral vascular disease (HCC) 04/15/2013  . Chronic venous insufficiency 04/15/2013  . Morbid obesity (HCC) 06/11/2012  . Acute on chronic diastolic CHF (congestive heart failure) (HCC) 06/02/2012  . Unspecified essential hypertension 05/18/2012  . Atrial fibrillation (HCC) 04/27/2012  . CHF (congestive heart failure) (HCC) 04/27/2012  . Hypertension 04/27/2012    Palliative Care Assessment & Plan   HPI: 71 y.o. male  with past medical history of multiple complicated nonhealing decubitus ulcers with recent admission requiring surgical debridement complicated by aspiration pneumonica, Atrial fibrillation on Eliquis, diastolic heart failure, HTN, chronic anemia, chronic venous insufficiency, chronic edema, liver cirrhosis per CT, lesions in liver and pancreas per CT (thought to be cyst vs pseudocyst) admitted on 02/24/2017 with hypoxia followed by AMS and hypercarbia. Went to [facility] from recent admission and just recently returned to SNF [facility]). Family says he did very well in LTAC and wounds were controlled well but very unhappy as they feel his decline has been just since he has been back in SNF.   Assessment: Jesus [name] is content . Having a little pain "in my bottom." He does not understand why he has pain and does not remember his decubitus ulcers. He says he feels well today.   I spoke more with his daughter, Jesus [name]. Jesus [name] is still frustrated with the situation. She feels trapped in the situation of him returning to SNF and knows that he will likely be back in this situation soon. We again spoke about how  fragile his health is and unfortunately this is likely true.  She also understands that this recurrent admissions and infections will take further tolls on his health. She also recognizes that there will be a time when he does not bounce back. Support provided. Reminded her that palliative care is available to them in the future and to let us know any way we can help. She is appreciative.   Recommendations/Plan:  Chronic pain r/t wounds: Consider tramadol prn. He has dilaudid 2 mg on his home list - I believe this may be too strong for him. Will follow for recs during hospitalization.   Please reconsult palliative care on future admissions.   Goals of Care and Additional Recommendations:  Limitations on Scope of Treatment: Full Scope Treatment  Code Status:  DNR  Prognosis:   Unable to determine but likely poor with multiple comorbitities and poor chance of improving  Discharge Planning:  Return to SNF   Thank you for allowing the Palliative Medicine Team to assist in the care of this patient.   Total Time Prolonged Time Billed  no       Greater than 50%  of this time was spent counseling and coordinating care related to the above assessment and plan.  Yong Channel, NP Palliative Medicine Team Pager # [phone] (M-F 8a-5p) Team Phone # [phone] (Nights/Weekends)

## 2017-02-26 NOTE — Progress Notes (Signed)
Peripherally Inserted Central Catheter/Midline Placement  The IV Nurse has discussed with the patient and/or persons authorized to consent for the patient, the purpose of this procedure and the potential benefits and risks involved with this procedure.  The benefits include less needle sticks, lab draws from the catheter, and the patient may be discharged home with the catheter. Risks include, but not limited to, infection, bleeding, blood clot (thrombus formation), and puncture of an artery; nerve damage and irregular heartbeat and possibility to perform a PICC exchange if needed/ordered by physician.  Alternatives to this procedure were also discussed.  Bard Power PICC patient education guide, fact sheet on infection prevention and patient information card has been provided to patient /or left at bedside.    PICC/Midline Placement Documentation        Lisabeth DevoidGibbs, Azzam Mehra Jeanette 02/26/2017, 9:42 AM Phone consent obtainedfrom daughterDulcieDeforge

## 2017-02-26 NOTE — Progress Notes (Signed)
[facility] PROGRESS NOTE  Agostino Gorin Machia RUE:[id number] DOB: 21-May-1946 DOA: 02/24/2017   PCP: Miki Kins     Assessment/Plan: Principal Problem:   Acute encephalopathy Active Problems:   Hypertension   Peripheral vascular disease (HCC)   Chronic venous insufficiency   Peripheral edema   Cellulitis   Chronic diastolic CHF (congestive heart failure) (HCC)   Decubitus ulcer of sacral region, unstageable (HCC)    71 y.o. gentleman with a history of chronic atrial fibrillation (anticoagulated with Eliquis; CHADS-Vasc score of 4), CKD 3, chronic diastolic heart failure, HTN, chronic anemia, chronic pain, chronic venous insufficiency, chronic edema, cirrhosis of the liver by CT, incidental lesions in his liver and pancreas seen on prior CT imaging, and recent admission from 1/22-2/16 for infected sacral decubitus ulcer requiring surgical debridement and aspiration pneumonia.  The wound ultimately required a wound vac and he was referred to [facility].  According to his daughter, he did well there, but has declined quickly since being back at [facility] for less than one week. Patient found to have altered mental status, probable sepsis, encephalopathy, started on broad-spectrum antibiotics, admitted to step down.  Assessment and plan Acute hypoxic hypercarbic respiratory failure In the setting of sepsis, chest x-ray consistent with pulmonary vascular congestion, edema,anemia  Cannot rule out aspiration, speech therapy evaluation pending Improved after initiation on BiPAP, ABG better this morning Patient will remain NPO , pending speech therapy evaluation Serial ABG, minimize narcotics   Probable sepsis secondary to catheter associated UTI as well as  cellulitis and superimposed infection involving chronic nonhealing sacral decubitus ulcer, both present on admission. Continue vanc and zosyn, day #2 --Blood and urine cultures pending Follow wound care recommendations  Acute  metabolic encephalopathy , and the setting of probable sepsis, respiratory failure --Monitor in the stepdown unit on BiPAP PRN  --All oral medication on hold for now; if mental status not improved by morning, will   address  long-term anticoagulation with the daughter --Patient's sister refused head CT as part of work-up for mental status changes  Subtherapeutic depakote level --Oral depakote dosing converted to IV 250mg  BID for now --Follow levels  Hepatic coagulopathic vs DIC ; INR 1.7 . Patient is on Eliquis for a fib    Hypotensive shock resuscitated with IV fluids --NS bolus prn, not a candidate for vasopressors --Oral BP meds on hold  Chronic atrial fibrillation Currently rate controlled Unable to take Eliquis pending SLP eval, not a  candidate for long term anticoagulation in the setting of anemia    Anemia, baseline around 7.8, hemoglobin 6.6 this morning Multifactorial secondary to blood loss from wounds, poor nutritional status May not be a candidate for long-term anticoagulation, but daughter would like to keep him on eliquis  Check FOBT, patient receiving 2 units of packed red blood cells Patient received transfusion on 3/13, 3/22-total of 3 units this month  Not a candidate , for long term anticoagulation/invasive GI workup-discussed with the patient's daughter/POA 3/22, however she would like patient to stay on anticoagulation Start PPI  Diarrhea Continue flexiseal, C. difficile negative  Chronic diastolic heart failure, compensated  Chronic venous stasis, edema, foot ulcers.  I did not see (sister objected).  Defer to new attending and wound care team in the AM.  Overall prognosis is poor.  Daughter is upholding DNR/DNI status at this time.     DVT prophylaxsis Lovenox  Code Status:  DNR     Family Communication: Discussed in detail with the  patient's daughter who is the POA, all imaging results, lab results explained to the patient   Disposition  Plan:   Palliative care consult for goals of care, recurrent hospitalizations,      Consultants:  Palliative     Procedures:  None   Antibiotics: Anti-infectives    Start     Dose/Rate Route Frequency Ordered Stop   02/26/17 2000  vancomycin (VANCOCIN) IVPB 1000 mg/200 mL premix     1,000 mg 200 mL/hr over 60 Minutes Intravenous Every 24 hours 02/26/17 0809     02/25/17 0800  vancomycin (VANCOCIN) IVPB 1000 mg/200 mL premix  Status:  Discontinued     1,000 mg 200 mL/hr over 60 Minutes Intravenous Every 12 hours 02/24/17 2102 02/26/17 0809   02/25/17 0600  piperacillin-tazobactam (ZOSYN) IVPB 3.375 g     3.375 g 12.5 mL/hr over 240 Minutes Intravenous Every 8 hours 02/24/17 2102     02/24/17 1930  vancomycin (VANCOCIN) 2,000 mg in sodium chloride 0.9 % 500 mL IVPB     2,000 mg 250 mL/hr over 120 Minutes Intravenous  Once 02/24/17 1915 02/24/17 2201   02/24/17 1915  piperacillin-tazobactam (ZOSYN) IVPB 3.375 g     3.375 g 100 mL/hr over 30 Minutes Intravenous  Once 02/24/17 1907 02/24/17 2359   02/24/17 1915  vancomycin (VANCOCIN) IVPB 1000 mg/200 mL premix  Status:  Discontinued     1,000 mg 200 mL/hr over 60 Minutes Intravenous  Once 02/24/17 1907 02/24/17 1915   02/24/17 1815  cefTRIAXone (ROCEPHIN) injection 1 g  Status:  Discontinued     1 g Intramuscular  Once 02/24/17 1801 02/24/17 1907         HPI/Subjective: Patient much more awake after being placed on BiPAP, requesting pain medications, confused per RN  Objective: Vitals:   02/26/17 0500 02/26/17 0600 02/26/17 0747 02/26/17 0748  BP: (!) 112/53 125/62  (!) 131/56  Pulse: 67 75  77  Resp: (!) 26 (!) 32  19  Temp:    98.5 F (36.9 C)  TempSrc:   Oral Oral  SpO2: 100% 96%  100%  Weight:      Height:        Intake/Output Summary (Last 24 hours) at 02/26/17 0908 Last data filed at 02/26/17 0526  Gross per 24 hour  Intake              458 ml  Output              775 ml  Net             -317 ml     Exam:  Cardiovascular: Irregular but rate controlled.  Trace pretibial edema bilaterally.    GI: abdomen is obese but soft and compressible.  No distention.  Bowel sounds are present. Musculoskeletal:  No joint deformity in upper and lower extremities. Moves extremities spontaneously.  No apparent contractures. Normal muscle tone.  Skin: Patient's sister refused to let me remove bilateral leg dressings in the ED, stating that "the problem is with his back".  Sacral wound dressing completely saturated with malodorous purulent discharge.  The ulcer in his back (which appears to go down to bone) still appears clean based but there is a 3-4cm rim of erythematous skin around the actually opening of the ulcer that is worrisome for new/progressive cellulitic changes and eventual necrosis. Neurologic: Arousable without any focal neurologic deficits   Data Reviewed: I have personally reviewed following labs and imaging studies  Micro Results  Recent Results (from the past 240 hour(s))  Culture, blood (routine x 2)     Status: None (Preliminary result)   Collection Time: 02/24/17  2:21 PM  Result Value Ref Range Status   Specimen Description BLOOD RIGHT UPPER  Final   Special Requests BOTTLES DRAWN AEROBIC ONLY 5CC  Final   Culture NO GROWTH < 24 HOURS  Final   Report Status PENDING  Incomplete  Culture, blood (routine x 2)     Status: None (Preliminary result)   Collection Time: 02/24/17  3:48 PM  Result Value Ref Range Status   Specimen Description BLOOD LEFT ANTECUBITAL  Final   Special Requests BOTTLES DRAWN AEROBIC ONLY 10CC  Final   Culture NO GROWTH < 24 HOURS  Final   Report Status PENDING  Incomplete  Urine culture     Status: Abnormal (Preliminary result)   Collection Time: 02/24/17  4:01 PM  Result Value Ref Range Status   Specimen Description URINE, CATHETERIZED  Final   Special Requests NONE  Final   Culture >=100,000 COLONIES/mL PROVIDENCIA STUARTII (A)  Final   Report  Status PENDING  Incomplete  MRSA PCR Screening     Status: None   Collection Time: 02/24/17  9:34 PM  Result Value Ref Range Status   MRSA by PCR NEGATIVE NEGATIVE Final    Comment:        The GeneXpert MRSA Assay (FDA approved for NASAL specimens only), is one component of a comprehensive MRSA colonization surveillance program. It is not intended to diagnose MRSA infection nor to guide or monitor treatment for MRSA infections.   C difficile quick scan w PCR reflex     Status: None   Collection Time: 02/25/17  9:53 AM  Result Value Ref Range Status   C Diff antigen NEGATIVE NEGATIVE Final   C Diff toxin NEGATIVE NEGATIVE Final   C Diff interpretation No C. difficile detected.  Final    Radiology Reports Dg Chest Port 1 View  Result Date: 02/24/2017 CLINICAL DATA:  Altered mental status EXAM: PORTABLE CHEST 1 VIEW COMPARISON:  January 20, 2017 FINDINGS: There is persistent cardiomegaly. There is pulmonary venous hypertension. There is no edema or consolidation. No adenopathy. No bone lesions. IMPRESSION: Pulmonary vascular congestion without edema or consolidation. Electronically Signed   By: Bretta Bang III M.D.   On: 02/24/2017 15:08     CBC  Recent Labs Lab 02/24/17 1421 02/25/17 0214 02/26/17 0334  WBC 8.4 10.3 7.5  HGB 8.0* 8.5* 6.6*  HCT 26.2* 29.3* 22.2*  PLT 344 284 276  MCV 93.2 95.8 94.9  MCH 28.5 27.8 28.2  MCHC 30.5 29.0* 29.7*  RDW 18.4* 18.5* 18.2*  LYMPHSABS 0.8  --   --   MONOABS 0.7  --   --   EOSABS 0.8*  --   --   BASOSABS 0.0  --   --     Chemistries   Recent Labs Lab 02/24/17 1421 02/25/17 0214 02/26/17 0334  NA 142 140 143  K 3.5 3.7 3.2*  CL 99* 101 102  CO2 33* 30 30  GLUCOSE 97 100* 81  BUN 17 15 15   CREATININE 0.96 0.85 1.09  CALCIUM 8.8* 8.4* 8.5*  MG  --  1.9  --   AST 24 17 14*  ALT 19 15* 12*  ALKPHOS 165* 141* 132*  BILITOT 0.9 0.8 1.3*    ------------------------------------------------------------------------------------------------------------------ estimated creatinine clearance is 84.2 mL/min (by C-G formula based on SCr of 1.09 mg/dL). ------------------------------------------------------------------------------------------------------------------  No results for input(s): HGBA1C in the last 72 hours. ------------------------------------------------------------------------------------------------------------------ No results for input(s): CHOL, HDL, LDLCALC, TRIG, CHOLHDL, LDLDIRECT in the last 72 hours. ------------------------------------------------------------------------------------------------------------------ No results for input(s): TSH, T4TOTAL, T3FREE, THYROIDAB in the last 72 hours.  Invalid input(s): FREET3 ------------------------------------------------------------------------------------------------------------------ No results for input(s): VITAMINB12, FOLATE, FERRITIN, TIBC, IRON, RETICCTPCT in the last 72 hours.  Coagulation profile  Recent Labs Lab 02/24/17 2109 02/26/17 0334  INR 1.71 1.70    No results for input(s): DDIMER in the last 72 hours.  Cardiac Enzymes No results for input(s): CKMB, TROPONINI, MYOGLOBIN in the last 168 hours.  Invalid input(s): CK ------------------------------------------------------------------------------------------------------------------ Invalid input(s): POCBNP   CBG: No results for input(s): GLUCAP in the last 168 hours.     Studies: Dg Chest Port 1 View  Result Date: 02/24/2017 CLINICAL DATA:  Altered mental status EXAM: PORTABLE CHEST 1 VIEW COMPARISON:  January 20, 2017 FINDINGS: There is persistent cardiomegaly. There is pulmonary venous hypertension. There is no edema or consolidation. No adenopathy. No bone lesions. IMPRESSION: Pulmonary vascular congestion without edema or consolidation. Electronically Signed   By: Bretta Bang III  M.D.   On: 02/24/2017 15:08      Lab Results  Component Value Date   HGBA1C 6.8 (H) 06/02/2012   Lab Results  Component Value Date   LDLCALC 98 04/02/2015   CREATININE 1.09 02/26/2017       Scheduled Meds: . sodium chloride   Intravenous Once  . enoxaparin (LOVENOX) injection  40 mg Subcutaneous Q24H  . mouth rinse  15 mL Mouth Rinse BID  . piperacillin-tazobactam (ZOSYN)  IV  3.375 g Intravenous Q8H  . potassium chloride  40 mEq Oral Once  . sodium chloride flush  3 mL Intravenous Q12H  . valproate sodium  250 mg Intravenous Q12H  . vancomycin  1,000 mg Intravenous Q24H   Continuous Infusions:   LOS: 2 days    Time spent: >30 MINS    [name]  [facility] Pager [phone]. If 7PM-7AM, please contact night-coverage at [email or web address], password [facility] 02/26/2017, 9:08 AM  LOS: 2 days

## 2017-02-26 NOTE — Evaluation (Signed)
Clinical/Bedside Swallow Evaluation Patient Details  Name: Jesus [name] MRN: [id number] Date of Birth: [birth year]  Today's Date: 02/26/2017 Time: SLP Start Time (ACUTE ONLY): 4098 SLP Stop Time (ACUTE ONLY): 0913 SLP Time Calculation (min) (ACUTE ONLY): 9 min  Past Medical History:  Past Medical History:  Diagnosis Date  . Acute on chronic renal failure (HCC)   . Arthritis    "normal for my age" (11/13/2016)  . Cellulitis and abscess of leg 11/2016  . Chronic anemia   . Chronic atrial fibrillation (HCC)   . Chronic diastolic CHF (congestive heart failure) (HCC) 12/2002   a. EF 50-55% by echo 04/2012  . Decubitus ulcer of buttock, unstageable (HCC) 12/31/2016  . Edema of lower extremity   . Gout   . H/O: GI bleed    "cause I took too much aspirin"  . High cholesterol   . Hyperglycemia    Noted 05/2012  . Hypertension   . Morbid obesity (HCC)   . PVD (peripheral vascular disease) (HCC)   . Venous stasis ulcer (HCC)    Past Surgical History:  Past Surgical History:  Procedure Laterality Date  . DEBRIDMENT OF DECUBITUS ULCER N/A 01/02/2017   Procedure: DEBRIDMENT OF DECUBITUS ULCER;  Surgeon: Axel Filler, MD;  Location: [facility] OR;  Service: General;  Laterality: N/A;  . NO PAST SURGERIES     HPI:  71 y.o.gentleman with a history of chronic atrial fibrillation (anticoagulated with Eliquis; CHADS-Vasc score of 4), CKD 3, chronic diastolic heart failure, HTN, chronic anemia, chronic pain, chronic venous insufficiency, chronic edema, cirrhosis of the liver by CT, incidental lesions in his liver and pancreas seen on prior CT imaging, and recent admission from 1/22-2/16 for infected sacral decubitus ulcer requiring surgical debridement. The wound ultimately required a wound vac and he was referred to [facility]. According to his daughter, he did well there, but has declined quickly since being back at [facility] for less than one week.Patient found to have altered mental status, probable sepsis,  encephalopathy, started on broad-spectrum antibiotics. Pt was seen by SLP during admission in January/February 2018,  initially recommended to consume regular solids and thin liquids, but downgraded to puree due to encephalopathy. MRI from that admission negative for acute finding. Current 1 view CXR shows vascular congestion and all prior CXR from prior admission are also clear.    Assessment / Plan / Recommendation Clinical Impression  Pt demosntrates no signs of aspiration or dysphagia. He is currently alert and cooperative though disoriented to place and time. Given no signs of aspiration or clinical findings concerning for pna, recommend pt consume a regular diet and thin liquids with assist from staff for feeding and oral care. No SLP intervention needed. Will sign off.  SLP Visit Diagnosis: Dysphagia, oral phase (R13.11)    Aspiration Risk  Mild aspiration risk    Diet Recommendation Regular;Thin liquid   Liquid Administration via: Cup;Straw Medication Administration: Whole meds with liquid Supervision: Staff to assist with self feeding Compensations: Slow rate;Small sips/bites Postural Changes: Remain upright for at least 30 minutes after po intake    Other  Recommendations Oral Care Recommendations: Oral care BID   Follow up Recommendations None      Frequency and Duration            Prognosis        Swallow Study   General HPI: 71 y.o.gentleman with a history of chronic atrial fibrillation (anticoagulated with Eliquis; CHADS-Vasc score of 4), CKD 3, chronic diastolic heart failure, HTN,  chronic anemia, chronic pain, chronic venous insufficiency, chronic edema, cirrhosis of the liver by CT, incidental lesions in his liver and pancreas seen on prior CT imaging, and recent admission from 1/22-2/16 for infected sacral decubitus ulcer requiring surgical debridement. The wound ultimately required a wound vac and [facility] According to his daughter, he did well  there, but has declined quickly [facility].Patient found to have altered mental status, probable sepsis, encephalopathy, started on broad-spectrum antibiotics. Pt was seen by SLP during admission in January/February 2018,  initially recommended to consume regular solids and thin liquids, but downgraded to puree due to encephalopathy. MRI from that admission negative for acute finding. Current 1 view CXR shows vascular congestion and all prior CXR from prior admission are also clear.  Type of Study: Bedside Swallow Evaluation Previous Swallow Assessment: see HPI Diet Prior to this Study: NPO Temperature Spikes Noted: No Respiratory Status: Room air History of Recent Intubation: No Behavior/Cognition: Alert;Cooperative;Pleasant mood;Confused Oral Cavity Assessment: Within Functional Limits Oral Care Completed by SLP: No Oral Cavity - Dentition: Adequate natural dentition Vision: Functional for self-feeding Self-Feeding Abilities: Able to feed self Patient Positioning: Partially reclined Baseline Vocal Quality: Normal Volitional Cough: Strong Volitional Swallow: Able to elicit    Oral/Motor/Sensory Function Overall Oral Motor/Sensory Function: Within functional limits   Ice Chips Ice chips: Within functional limits   Thin Liquid Thin Liquid: Within functional limits Presentation: Cup;Straw    Nectar Thick Nectar Thick Liquid: Not tested   Honey Thick Honey Thick Liquid: Not tested   Puree Puree: Within functional limits Presentation: Spoon   Solid   GO   Solid: Within functional limits Presentation: Self Fed       Harlon DittyBonnie Aislyn Hayse, MA CCC-SLP [phone]  Claudine MoutonDeBlois, Andrena Margerum Caroline 02/26/2017,9:15 AM

## 2017-02-26 NOTE — Progress Notes (Signed)
ANTICOAGULATION CONSULT NOTE   Pharmacy Consult for Apixaban  Indication: atrial fibrillation  Allergies  Allergen Reactions  . Amlodipine Besylate Swelling and Other (See Comments)    "started off low and ended up severe; if, in fact, that is what's causing the swelling" (06/03/12)  . Levaquin [Levofloxacin In D5w] Rash    Patient Measurements: Height: 5\' 10"  (177.8 cm) Weight: 279 lb (126.6 kg) IBW/kg (Calculated) : 73   Vital Signs: Temp: 98.5 F (36.9 C) (03/22 0748) Temp Source: Oral (03/22 0748) BP: 131/56 (03/22 0748) Pulse Rate: 77 (03/22 0748)  Labs:  Recent Labs  02/24/17 1421 02/24/17 2109 02/25/17 0214 02/26/17 0334  HGB 8.0*  --  8.5* 6.6*  HCT 26.2*  --  29.3* 22.2*  PLT 344  --  284 276  APTT  --  47*  --   --   LABPROT  --  20.3*  --  20.2*  INR  --  1.71  --  1.70  CREATININE 0.96  --  0.85 1.09    Estimated Creatinine Clearance: 84.2 mL/min (by C-G formula based on SCr of 1.09 mg/dL).   Medical History: Past Medical History:  Diagnosis Date  . Acute on chronic renal failure (HCC)   . Arthritis    "normal for my age" (11/13/2016)  . Cellulitis and abscess of leg 11/2016  . Chronic anemia   . Chronic atrial fibrillation (HCC)   . Chronic diastolic CHF (congestive heart failure) (HCC) 12/2002   a. EF 50-55% by echo 04/2012  . Decubitus ulcer of buttock, unstageable (HCC) 12/31/2016  . Edema of lower extremity   . Gout   . H/O: GI bleed    "cause I took too much aspirin"  . High cholesterol   . Hyperglycemia    Noted 05/2012  . Hypertension   . Morbid obesity (HCC)   . PVD (peripheral vascular disease) (HCC)   . Venous stasis ulcer (HCC)    Assessment: 71 yo male on apixaban prior to arrival for atrial fibrillation. Initially held due to anemia and NPO status but per MD to resume today.  Hgb 6.6 but has history of anemia at baseline and no current sxs of bleeding.   Goal of Therapy:  Monitor platelets by anticoagulation protocol: Yes   Plan:  1. Restart prior to arrival Apixaban 5 mg BID 2. Follow up on any bleeding issues   Pollyann SamplesAndy Fanchon Papania, PharmD, BCPS 02/26/2017, 10:24 AM

## 2017-02-27 DIAGNOSIS — I5032 Chronic diastolic (congestive) heart failure: Secondary | ICD-10-CM

## 2017-02-27 LAB — BPAM RBC
Blood Product Expiration Date: 201804122359
Blood Product Expiration Date: 201804122359
ISSUE DATE / TIME: 201803221236
ISSUE DATE / TIME: 201803221725
Unit Type and Rh: 6200
Unit Type and Rh: 6200

## 2017-02-27 LAB — TYPE AND SCREEN
ABO/RH(D): A POS
Antibody Screen: NEGATIVE
Unit division: 0
Unit division: 0

## 2017-02-27 LAB — COMPREHENSIVE METABOLIC PANEL
ALBUMIN: 1.7 g/dL — AB (ref 3.5–5.0)
ALT: 12 U/L — ABNORMAL LOW (ref 17–63)
ANION GAP: 9 (ref 5–15)
AST: 17 U/L (ref 15–41)
Alkaline Phosphatase: 139 U/L — ABNORMAL HIGH (ref 38–126)
BILIRUBIN TOTAL: 1.2 mg/dL (ref 0.3–1.2)
BUN: 11 mg/dL (ref 6–20)
CO2: 31 mmol/L (ref 22–32)
Calcium: 8.3 mg/dL — ABNORMAL LOW (ref 8.9–10.3)
Chloride: 101 mmol/L (ref 101–111)
Creatinine, Ser: 0.99 mg/dL (ref 0.61–1.24)
GFR calc Af Amer: >60 mL/min (ref >60)
GFR calc non Af Amer: >60 mL/min (ref >60)
GLUCOSE: 103 mg/dL — AB (ref 65–99)
POTASSIUM: 3.5 mmol/L (ref 3.5–5.1)
Sodium: 141 mmol/L (ref 135–145)
TOTAL PROTEIN: 5.6 g/dL — AB (ref 6.5–8.1)

## 2017-02-27 LAB — CBC
HEMATOCRIT: 28.1 % — AB (ref 39.0–52.0)
Hemoglobin: 8.4 g/dL — ABNORMAL LOW (ref 13.0–17.0)
MCH: 28.1 pg (ref 26.0–34.0)
MCHC: 29.9 g/dL — AB (ref 30.0–36.0)
MCV: 94 fL (ref 78.0–100.0)
PLATELETS: 291 10*3/uL (ref 150–400)
RBC: 2.99 MIL/uL — ABNORMAL LOW (ref 4.22–5.81)
RDW: 17.7 % — AB (ref 11.5–15.5)
WBC: 8.3 10*3/uL (ref 4.0–10.5)

## 2017-02-27 LAB — AMMONIA: AMMONIA: 51 umol/L — AB (ref 9–35)

## 2017-02-27 MED ORDER — MEMANTINE HCL 10 MG PO TABS
5.0000 mg | ORAL_TABLET | Freq: Two times a day (BID) | ORAL | Status: DC
Start: 2017-02-27 — End: 2017-03-17
  Administered 2017-02-27 – 2017-03-17 (×34): 5 mg via ORAL
  Filled 2017-02-27 (×38): qty 1

## 2017-02-27 MED ORDER — DIVALPROEX SODIUM 125 MG PO CSDR
250.0000 mg | DELAYED_RELEASE_CAPSULE | Freq: Two times a day (BID) | ORAL | Status: DC
  Administered 2017-02-27 – 2017-03-17 (×33): 250 mg via ORAL
  Filled 2017-02-27 (×35): qty 2

## 2017-02-27 MED ORDER — LACTULOSE 10 GM/15ML PO SOLN
20.0000 g | Freq: Two times a day (BID) | ORAL | Status: DC
  Administered 2017-02-27 – 2017-03-17 (×32): 20 g via ORAL
  Filled 2017-02-27 (×35): qty 30

## 2017-02-27 MED ORDER — DEXTROSE 5 % IV SOLN
1.0000 g | INTRAVENOUS | Status: DC
  Administered 2017-02-27 – 2017-03-07 (×9): 1 g via INTRAVENOUS
  Filled 2017-02-27 (×10): qty 10

## 2017-02-27 NOTE — Progress Notes (Signed)
Attempt to call report to [place], RN [phone].  Will call back.

## 2017-02-27 NOTE — Progress Notes (Signed)
Pharmacy Antibiotic Note Jesus MooresDouglas M Jorden is a 71 y.o. male admitted on 02/24/2017 with UTI and concern for cellulitis. Currently on day 4 of empiric therapy with Zosyn and vancomycin.   Plan: 1. Continue vancomycin 1 gram IV every 24 hours based on previous dosing/levels 2. Zosyn 3.375gm IV every 8 hours 3. Would consider deescalating abx based on final culture data    Temp (24hrs), Avg:98.8 F (37.1 C), Min:97.9 F (36.6 C), Max:100.3 F (37.9 C)   Recent Labs Lab 02/24/17 1421 02/24/17 1950 02/25/17 0214 02/26/17 0334 02/27/17 0447  WBC 8.4  --  10.3 7.5 8.3  CREATININE 0.96  --  0.85 1.09 0.99  LATICACIDVEN  --  1.19  --   --   --     Estimated Creatinine Clearance: 92.7 mL/min (by C-G formula based on SCr of 0.99 mg/dL).    Allergies  Allergen Reactions  . Amlodipine Besylate Swelling and Other (See Comments)    "started off low and ended up severe; if, in fact, that is what's causing the swelling" (06/03/12)  . Levaquin [Levofloxacin In D5w] Rash    Antimicrobials this admission: 3/20 vancomycin >>  3/20 Zosyn >>   Pertinent Culture data:  3/20 blood x 2: ngtd 3/20 urine: Providencia S to Zosyn, Ceftriaxone and carbapenem  3/21 C.diff: negative   Thank you for allowing pharmacy to be a part of this patient's care.  Pollyann SamplesAndy Illyanna Petillo, PharmD, BCPS 02/27/2017, 9:42 AM

## 2017-02-27 NOTE — Progress Notes (Addendum)
[facility] PROGRESS NOTE  Jesus [name] UJW:[id number] DOB: August 09, 1946 DOA: 02/24/2017   PCP: Miki Kins     Assessment/Plan: Principal Problem:   Acute encephalopathy Active Problems:   Hypertension   Peripheral vascular disease (HCC)   Chronic venous insufficiency   Peripheral edema   Cellulitis   Chronic diastolic CHF (congestive heart failure) (HCC)   Decubitus ulcer of sacral region, unstageable (HCC)   Goals of care, counseling/discussion   Palliative care encounter    71 y.o. gentleman with a history of chronic atrial fibrillation (anticoagulated with Eliquis; CHADS-Vasc score of 4), CKD 3, chronic diastolic heart failure, HTN, chronic anemia, chronic pain, chronic venous insufficiency, chronic edema, cirrhosis of the liver by CT, incidental lesions in his liver and pancreas seen on prior CT imaging, and recent admission from 1/22-2/16 for infected sacral decubitus ulcer requiring surgical debridement and aspiration pneumonia.  The wound ultimately required a wound vac and he was referred to [facility].  According to his daughter, he did well there, but has declined quickly since being back at [facility] for less than one week. Patient found to have altered mental status, probable sepsis, encephalopathy, started on broad-spectrum antibiotics, admitted to step down.  Assessment and plan Acute hypoxic hypercarbic respiratory failure,off bipap In the setting of sepsis,  initial chest x-ray consistent with pulmonary vascular congestion, edema,anemia  Cannot rule out aspiration, speech therapy evaluation recommended regular diet and thin liquids Improved after initiation on BiPAP, off BiPAP for 48 hours, ABG has improved, currently on 2 L of oxygen Minimize narcotic medications   Probable sepsis secondary to catheter associated UTI as well as  cellulitis and superimposed infection involving chronic nonhealing sacral decubitus ulcer, both present on admission. On vanc  and zosyn, since admission. Antibiotics narrowed to Rocephin for UTI Urine culture positive, blood culture no growth so far Follow wound care recommendations  Acute metabolic encephalopathy , and the setting of probable sepsis, respiratory failure Improved on BiPAP Held all by mouth medications including Benadryl, narcotics, Namenda, Remeron, Seroquel According to the daughter the patient Seroquel was recently started an SNF Patient is doing much better off of all of these medications Can resume Namenda today --Patient's sister refused head CT as part of work-up for mental status changes  Subtherapeutic depakote level --Oral depakote dosing converted to IV 250mg  BID , changed back to by mouth --Follow levels  Hepatic coagulopathic vs DIC ; INR 1.7 . Patient is on Eliquis for a fib    Hypotensive shock resuscitated with IV fluids --NS bolus prn, not a candidate for vasopressors --Oral BP meds on hold  Chronic atrial fibrillation Currently rate controlled Restarted Eliquis , not a  candidate for long term anticoagulation in the setting of anemia    Anemia, baseline around 7.8, hemoglobin 6.6 this morning Multifactorial secondary to blood loss from wounds, poor nutritional status May not be a candidate for long-term anticoagulation, but daughter would like to keep him on eliquis , due to risk of stroke FOBT still pending, status post 2 units of packed red blood cells on 3/22 Patient received transfusion on 3/13, 3/22-total of 3 units this month  Not a candidate , for long term anticoagulation/or invasive GI workup-discussed with the patient's daughter/POA 3/22, however she would like patient to stay on anticoagulation even if he becomes transfusion dependent Continue PPI   Diarrhea Continue flexiseal due to sacral ulcers, C. difficile negative,  Chronic diastolic heart failure, compensated  Chronic venous stasis, edema, foot  ulcers.  Wound type:stage IV sacrum, partial  thickness buttocks, bilateral pretibial and calves, left lat foot, left inner thigh. Unstageable to bilateral heels, and  lateral plantar surface of R foot, DTI to L medial heel  Overall prognosis is poor.  Daughter is upholding DNR/DNI status at this time.     DVT prophylaxsis Lovenox  Code Status:  DNR     Family Communication: Discussed in detail with the patient's daughter who is the POA, all imaging results, lab results explained to the patient   Disposition Plan:   Palliative care consult for goals of care, recurrent hospitalizations, transfer to telemetry      Consultants:  Palliative     Procedures:  None   Antibiotics: Anti-infectives    Start     Dose/Rate Route Frequency Ordered Stop   02/27/17 1300  cefTRIAXone (ROCEPHIN) 1 g in dextrose 5 % 50 mL IVPB     1 g 100 mL/hr over 30 Minutes Intravenous Every 24 hours 02/27/17 1245     02/26/17 2000  vancomycin (VANCOCIN) IVPB 1000 mg/200 mL premix  Status:  Discontinued     1,000 mg 200 mL/hr over 60 Minutes Intravenous Every 24 hours 02/26/17 0809 02/27/17 1245   02/25/17 0800  vancomycin (VANCOCIN) IVPB 1000 mg/200 mL premix  Status:  Discontinued     1,000 mg 200 mL/hr over 60 Minutes Intravenous Every 12 hours 02/24/17 2102 02/26/17 0809   02/25/17 0600  piperacillin-tazobactam (ZOSYN) IVPB 3.375 g  Status:  Discontinued     3.375 g 12.5 mL/hr over 240 Minutes Intravenous Every 8 hours 02/24/17 2102 02/27/17 1245   02/24/17 1930  vancomycin (VANCOCIN) 2,000 mg in sodium chloride 0.9 % 500 mL IVPB     2,000 mg 250 mL/hr over 120 Minutes Intravenous  Once 02/24/17 1915 02/24/17 2201   02/24/17 1915  piperacillin-tazobactam (ZOSYN) IVPB 3.375 g     3.375 g 100 mL/hr over 30 Minutes Intravenous  Once 02/24/17 1907 02/24/17 2359   02/24/17 1915  vancomycin (VANCOCIN) IVPB 1000 mg/200 mL premix  Status:  Discontinued     1,000 mg 200 mL/hr over 60 Minutes Intravenous  Once 02/24/17 1907 02/24/17 1915    02/24/17 1815  cefTRIAXone (ROCEPHIN) injection 1 g  Status:  Discontinued     1 g Intramuscular  Once 02/24/17 1801 02/24/17 1907         HPI/Subjective: Patient much more awake , Pain is under control  Objective: Vitals:   02/27/17 0600 02/27/17 0700 02/27/17 0714 02/27/17 1128  BP: 114/69 97/62 123/69 128/81  Pulse: 73 67 67 71  Resp: (!) 32 (!) 29 (!) 30 (!) 34  Temp:   98.6 F (37 C) 98.6 F (37 C)  TempSrc:   Oral Axillary  SpO2: 100% 100% 100% 100%  Weight:      Height:        Intake/Output Summary (Last 24 hours) at 02/27/17 1246 Last data filed at 02/27/17 1100  Gross per 24 hour  Intake           1710.5 ml  Output              400 ml  Net           1310.5 ml    Exam:  Cardiovascular: Irregular but rate controlled.  Trace pretibial edema bilaterally.    GI: abdomen is obese but soft and compressible.  No distention.  Bowel sounds are present. Musculoskeletal:  No joint deformity in upper and  lower extremities. Moves extremities spontaneously.  No apparent contractures. Normal muscle tone.  Skin: Patient's sister refused to let me remove bilateral leg dressings in the ED, stating that "the problem is with his back".  Sacral wound dressing completely saturated with malodorous purulent discharge.  The ulcer in his back (which appears to go down to bone) still appears clean based but there is a 3-4cm rim of erythematous skin around the actually opening of the ulcer that is worrisome for new/progressive cellulitic changes and eventual necrosis. Neurologic: Arousable without any focal neurologic deficits   Data Reviewed: I have personally reviewed following labs and imaging studies  Micro Results Recent Results (from the past 240 hour(s))  Culture, blood (routine x 2)     Status: None (Preliminary result)   Collection Time: 02/24/17  2:21 PM  Result Value Ref Range Status   Specimen Description BLOOD RIGHT UPPER  Final   Special Requests BOTTLES DRAWN AEROBIC  ONLY 5CC  Final   Culture NO GROWTH 2 DAYS  Final   Report Status PENDING  Incomplete  Culture, blood (routine x 2)     Status: None (Preliminary result)   Collection Time: 02/24/17  3:48 PM  Result Value Ref Range Status   Specimen Description BLOOD LEFT ANTECUBITAL  Final   Special Requests BOTTLES DRAWN AEROBIC ONLY 10CC  Final   Culture NO GROWTH 2 DAYS  Final   Report Status PENDING  Incomplete  Urine culture     Status: Abnormal   Collection Time: 02/24/17  4:01 PM  Result Value Ref Range Status   Specimen Description URINE, CATHETERIZED  Final   Special Requests NONE  Final   Culture >=100,000 COLONIES/mL PROVIDENCIA STUARTII (A)  Final   Report Status 02/26/2017 FINAL  Final   Organism ID, Bacteria PROVIDENCIA STUARTII (A)  Final      Susceptibility   Providencia stuartii - MIC*    AMPICILLIN >=32 RESISTANT Resistant     CEFAZOLIN >=64 RESISTANT Resistant     CEFTRIAXONE <=1 SENSITIVE Sensitive     CIPROFLOXACIN 2 INTERMEDIATE Intermediate     GENTAMICIN RESISTANT Resistant     IMIPENEM 2 SENSITIVE Sensitive     NITROFURANTOIN 128 RESISTANT Resistant     TRIMETH/SULFA >=320 RESISTANT Resistant     AMPICILLIN/SULBACTAM >=32 RESISTANT Resistant     PIP/TAZO <=4 SENSITIVE Sensitive     * >=100,000 COLONIES/mL PROVIDENCIA STUARTII  MRSA PCR Screening     Status: None   Collection Time: 02/24/17  9:34 PM  Result Value Ref Range Status   MRSA by PCR NEGATIVE NEGATIVE Final    Comment:        The GeneXpert MRSA Assay (FDA approved for NASAL specimens only), is one component of a comprehensive MRSA colonization surveillance program. It is not intended to diagnose MRSA infection nor to guide or monitor treatment for MRSA infections.   C difficile quick scan w PCR reflex     Status: None   Collection Time: 02/25/17  9:53 AM  Result Value Ref Range Status   C Diff antigen NEGATIVE NEGATIVE Final   C Diff toxin NEGATIVE NEGATIVE Final   C Diff interpretation No C.  difficile detected.  Final    Radiology Reports Dg Chest Port 1 View  Result Date: 02/26/2017 CLINICAL DATA:  PICC line. EXAM: PORTABLE CHEST 1 VIEW COMPARISON:  02/24/2017 . FINDINGS: PICC line noted with tip over the cavoatrial junction. Cardiomegaly with pulmonary interstitial prominence. Small right pleural effusion. No pneumothorax . IMPRESSION:  1. PICC line noted with tip projected over the cavoatrial junction. 2. Congestive heart failure with pulmonary interstitial edema and small right pleural effusion . Electronically Signed   By: Maisie Fus  Register   On: 02/26/2017 11:37   Dg Chest Port 1 View  Result Date: 02/24/2017 CLINICAL DATA:  Altered mental status EXAM: PORTABLE CHEST 1 VIEW COMPARISON:  January 20, 2017 FINDINGS: There is persistent cardiomegaly. There is pulmonary venous hypertension. There is no edema or consolidation. No adenopathy. No bone lesions. IMPRESSION: Pulmonary vascular congestion without edema or consolidation. Electronically Signed   By: Bretta Bang III M.D.   On: 02/24/2017 15:08     CBC  Recent Labs Lab 02/24/17 1421 02/25/17 0214 02/26/17 0334 02/27/17 0447  WBC 8.4 10.3 7.5 8.3  HGB 8.0* 8.5* 6.6* 8.4*  HCT 26.2* 29.3* 22.2* 28.1*  PLT 344 284 276 291  MCV 93.2 95.8 94.9 94.0  MCH 28.5 27.8 28.2 28.1  MCHC 30.5 29.0* 29.7* 29.9*  RDW 18.4* 18.5* 18.2* 17.7*  LYMPHSABS 0.8  --   --   --   MONOABS 0.7  --   --   --   EOSABS 0.8*  --   --   --   BASOSABS 0.0  --   --   --     Chemistries   Recent Labs Lab 02/24/17 1421 02/25/17 0214 02/26/17 0334 02/27/17 0447  NA 142 140 143 141  K 3.5 3.7 3.2* 3.5  CL 99* 101 102 101  CO2 33* 30 30 31   GLUCOSE 97 100* 81 103*  BUN 17 15 15 11   CREATININE 0.96 0.85 1.09 0.99  CALCIUM 8.8* 8.4* 8.5* 8.3*  MG  --  1.9  --   --   AST 24 17 14* 17  ALT 19 15* 12* 12*  ALKPHOS 165* 141* 132* 139*  BILITOT 0.9 0.8 1.3* 1.2    ------------------------------------------------------------------------------------------------------------------ estimated creatinine clearance is 92.7 mL/min (by C-G formula based on SCr of 0.99 mg/dL). ------------------------------------------------------------------------------------------------------------------ No results for input(s): HGBA1C in the last 72 hours. ------------------------------------------------------------------------------------------------------------------ No results for input(s): CHOL, HDL, LDLCALC, TRIG, CHOLHDL, LDLDIRECT in the last 72 hours. ------------------------------------------------------------------------------------------------------------------ No results for input(s): TSH, T4TOTAL, T3FREE, THYROIDAB in the last 72 hours.  Invalid input(s): FREET3 ------------------------------------------------------------------------------------------------------------------ No results for input(s): VITAMINB12, FOLATE, FERRITIN, TIBC, IRON, RETICCTPCT in the last 72 hours.  Coagulation profile  Recent Labs Lab 02/24/17 2109 02/26/17 0334  INR 1.71 1.70    No results for input(s): DDIMER in the last 72 hours.  Cardiac Enzymes No results for input(s): CKMB, TROPONINI, MYOGLOBIN in the last 168 hours.  Invalid input(s): CK ------------------------------------------------------------------------------------------------------------------ Invalid input(s): POCBNP   CBG: No results for input(s): GLUCAP in the last 168 hours.     Studies: Dg Chest Port 1 View  Result Date: 02/26/2017 CLINICAL DATA:  PICC line. EXAM: PORTABLE CHEST 1 VIEW COMPARISON:  02/24/2017 . FINDINGS: PICC line noted with tip over the cavoatrial junction. Cardiomegaly with pulmonary interstitial prominence. Small right pleural effusion. No pneumothorax . IMPRESSION: 1. PICC line noted with tip projected over the cavoatrial junction. 2. Congestive heart failure with pulmonary  interstitial edema and small right pleural effusion . Electronically Signed   By: Maisie Fus  Register   On: 02/26/2017 11:37      Lab Results  Component Value Date   HGBA1C 6.8 (H) 06/02/2012   Lab Results  Component Value Date   LDLCALC 98 04/02/2015   CREATININE 0.99 02/27/2017       Scheduled Meds: .  sodium chloride   Intravenous Once  . apixaban  5 mg Oral BID  . cefTRIAXone (ROCEPHIN)  IV  1 g Intravenous Q24H  . feeding supplement (ENSURE ENLIVE)  237 mL Oral BID BM  . feeding supplement (PRO-STAT SUGAR FREE 64)  30 mL Oral BID  . mouth rinse  15 mL Mouth Rinse BID  . pantoprazole (PROTONIX) IV  40 mg Intravenous QHS  . sodium chloride flush  10-40 mL Intracatheter Q12H  . sodium chloride flush  3 mL Intravenous Q12H  . valproate sodium  250 mg Intravenous Q12H   Continuous Infusions:   LOS: 3 days    Time spent: >30 MINS    Davita Medical GroupBROL,Ariyon Gerstenberger  [facility] Pager [phone]. If 7PM-7AM, please contact night-coverage at [email or web address], password [email or web address] 02/27/2017, 12:46 PM  LOS: 3 days

## 2017-02-27 NOTE — Progress Notes (Signed)
Report called to Sierra LeoneLatisha. RN [phone].

## 2017-02-27 NOTE — Plan of Care (Signed)
Problem: Pain Managment: Goal: General experience of comfort will improve Outcome: Progressing Patient given fentanyl x 1 to address constant calling out and complaints of pain with repositioning. Patient got more rest tonight than previous night, but hesitant to overmedicate with fentanyl.  Problem: Tissue Perfusion: Goal: Risk factors for ineffective tissue perfusion will decrease Outcome: Progressing On low air loss redistribution mattress and turns attempted with patient allows.   Problem: Activity: Goal: Risk for activity intolerance will decrease Outcome: Not Progressing Patient refusing to be turned frequently and yells out/curses at staff each time he is turned.   Problem: Fluid Volume: Goal: Ability to maintain a balanced intake and output will improve Outcome: Not Progressing Patient did not have adequate output from foley catheter. Palpated bladder and no pain or distention noted.   Problem: Bowel/Gastric: Goal: Will not experience complications related to bowel motility Outcome: Progressing Patient flexiseal removed- fell out and stool was more formed.

## 2017-02-28 DIAGNOSIS — I4891 Unspecified atrial fibrillation: Secondary | ICD-10-CM

## 2017-02-28 DIAGNOSIS — N390 Urinary tract infection, site not specified: Secondary | ICD-10-CM

## 2017-02-28 DIAGNOSIS — J9602 Acute respiratory failure with hypercapnia: Secondary | ICD-10-CM

## 2017-02-28 DIAGNOSIS — J9601 Acute respiratory failure with hypoxia: Secondary | ICD-10-CM

## 2017-02-28 DIAGNOSIS — A419 Sepsis, unspecified organism: Secondary | ICD-10-CM

## 2017-02-28 DIAGNOSIS — J810 Acute pulmonary edema: Secondary | ICD-10-CM

## 2017-02-28 LAB — CBC
HEMATOCRIT: 26.3 % — AB (ref 39.0–52.0)
HEMOGLOBIN: 7.9 g/dL — AB (ref 13.0–17.0)
MCH: 28.4 pg (ref 26.0–34.0)
MCHC: 30 g/dL (ref 30.0–36.0)
MCV: 94.6 fL (ref 78.0–100.0)
Platelets: 281 10*3/uL (ref 150–400)
RBC: 2.78 MIL/uL — ABNORMAL LOW (ref 4.22–5.81)
RDW: 17.2 % — AB (ref 11.5–15.5)
WBC: 8.1 10*3/uL (ref 4.0–10.5)

## 2017-02-28 LAB — COMPREHENSIVE METABOLIC PANEL
ALT: 11 U/L — ABNORMAL LOW (ref 17–63)
ANION GAP: 9 (ref 5–15)
AST: 15 U/L (ref 15–41)
Albumin: 1.6 g/dL — ABNORMAL LOW (ref 3.5–5.0)
Alkaline Phosphatase: 126 U/L (ref 38–126)
BUN: 10 mg/dL (ref 6–20)
CO2: 31 mmol/L (ref 22–32)
Calcium: 8.4 mg/dL — ABNORMAL LOW (ref 8.9–10.3)
Chloride: 102 mmol/L (ref 101–111)
Creatinine, Ser: 0.96 mg/dL (ref 0.61–1.24)
GFR calc Af Amer: >60 mL/min (ref >60)
GFR calc non Af Amer: >60 mL/min (ref >60)
GLUCOSE: 87 mg/dL (ref 65–99)
POTASSIUM: 3.3 mmol/L — AB (ref 3.5–5.1)
SODIUM: 142 mmol/L (ref 135–145)
Total Bilirubin: 1 mg/dL (ref 0.3–1.2)
Total Protein: 5.4 g/dL — ABNORMAL LOW (ref 6.5–8.1)

## 2017-02-28 MED ORDER — HYDROCORTISONE 1 % EX CREA
TOPICAL_CREAM | Freq: Four times a day (QID) | CUTANEOUS | Status: DC | PRN
  Administered 2017-02-28: 18:00:00 via TOPICAL
  Filled 2017-02-28: qty 28

## 2017-02-28 MED ORDER — HYDROCODONE-ACETAMINOPHEN 5-325 MG PO TABS
1.0000 | ORAL_TABLET | ORAL | Status: DC | PRN
  Administered 2017-02-28 – 2017-03-03 (×4): 1 via ORAL
  Filled 2017-02-28 (×5): qty 1

## 2017-02-28 MED ORDER — PANTOPRAZOLE SODIUM 40 MG PO TBEC
40.0000 mg | DELAYED_RELEASE_TABLET | Freq: Every day | ORAL | Status: DC
  Administered 2017-02-28 – 2017-03-16 (×16): 40 mg via ORAL
  Filled 2017-02-28 (×16): qty 1

## 2017-02-28 MED ORDER — POTASSIUM CHLORIDE CRYS ER 20 MEQ PO TBCR
40.0000 meq | EXTENDED_RELEASE_TABLET | Freq: Once | ORAL | Status: AC
  Administered 2017-02-28: 40 meq via ORAL
  Filled 2017-02-28: qty 2

## 2017-02-28 NOTE — Progress Notes (Signed)
Scanner broken. Morning meds verified with charge nurse.

## 2017-02-28 NOTE — Progress Notes (Signed)
Pt was transfered  To the floor from 4east per bed pt seam confuse and that is his baseline since this admission, has multiple wounds at sacrum buttocks legs and heels daily dressing change, according to nurse that gave me a report dressing was change  right before pt was transfered to the floor on arrival dressing was intact, skin assessment  done  Call light and phone within reach pt made comfortable in bed

## 2017-02-28 NOTE — Progress Notes (Addendum)
Patient ID: Jesus [name], male   DOB: 06-01-1946, 71 y.o.   MRN: [id number]  PROGRESS NOTE    Jesus [name]  WUJ:[id number] DOB: 06-01-1946 DOA: 02/24/2017  PCP: [name],[name], [name]   Brief Narrative:  71 y.o.male with a history of chronic atrial fibrillation (anticoagulated with Eliquis; CHADS-Vasc score of 4), CKD 3, chronic diastolic heart failure, HTN, chronic anemia, chronic pain, chronic venous insufficiency, chronic edema, cirrhosis of the liver by CT, incidental lesions on his liver and pancreas seen on prior CT imaging. He was recently hospitalized from 1/22-2/16 for aspiration pneumonia and infected sacral decubitus ulcer requiring surgical debridement. The wound ultimately required a wound vac and [facility] According to his daughter, he did well there, but has declined quickly since being back [facility] for less than one week. Patient found to have altered mental status and probable sepsis.   Assessment & Plan:   Acute hypoxic and hypercarbic respiratory failure / Acute pulmonary edema - CXR on admission showed CHF  With pulmonary interstitial edema and small right pleural effusion - Initially on BiPAP - Stable respiratory status  - He was evaluated by SLP - recommendation was for regular diet  - Continue oxygen support via [place] to keep O2 sats above 90%  Sepsis secondary to catheter associated Providenicia stuartii UTI as well as  cellulitis and superimposed infection involving chronic nonhealing sacral decubitus ulcer, both present on admission. - Urine cx grew Providencia - Blood cx so far showed no growth - Pt was on vanco and zosyn but this was narrowed down to Rocephin 3/22  Acute metabolic encephalopathy - In the setting of sepsis, UTI - He is oriented to time, place and person but he is aggravated because he has pressure boots on and feels uncomfortable - Continue memantine   Mood disorder - Continue Depakote  - Valproic acid level 11 in  3/20  Chronic atrial fibrillation / Coagulopathy  - CHADS vasc score 4 - Continue anticoagulation with apixaban - Rate controlled without beta blockers - Repeat INR in am; last INR 1.7, probably from h/o liver cirrhosis    Anemia of chronic disease - Due to bone marrow suppression from history of liver cirrhosis - Has had 2 units PRBC transfusion on 3/22 - Hgb this am 7.9 - Transfuse for hgb less than 7  Diarrhea - C. difficile negative - No diarrhea in past   Hypokalemia - Due to sepsis, diarrhea - Supplemented - Follow up BMP in am  Chronic diastolic heart failure - Compensated - 2 D ECHO in 12/2014 showed EF 55-60%  Liver cirrhosis - Continue lactulose  - Continue protonix   Multiple wounds, sacral wounds, stage 4 sacral decubitus ulcer - Stage IV sacrum, partial thickness buttocks, bilateral pretibial and calves, left lat foot, left inner thigh. Unstageable to bilateral heels, and  lateral plantar surface of R foot, DTI to L medial heel - Sacrum 15cm x 18cm x 6cm deep stage 4 - Buttocks with multiple various size partial thickness wounds - Unstageable on Lateral  plantar surface of R foot 1.5cm x 2cm x 0cm  - Unstageable R heel 2.5cm x 6cm x 0cm - R lateral calf 6cm x 3cm x 0.1cm partial thickness 5 - Proximal to right ankle 2.5cm x 0.5cm x 0cm dark purple.  - Two intact blisters to inner left thigh 6cm x 1.5cm and 3cm x 1cm - Left lateral calf 6cm x 3cm x 0.1cm partial thickness and one 3cm  - Left  medial heel 2.5cm x 2.5cm  - Left lateral foot 4cm x 4cm and a 2cm x 4cm  - Left posterior calf with 4cm x 3cm x 0cm  - Dressing procedure/placement/frequency: NS moistened W to D dressing BID for sacral ulcer. Xeroform and foam to wounds on heels, foam to intact blisters, xeroform and gauze to partial thickness on BLE calves. Pt is already on a specialty bed. Pt already has pressure alleviating boots on.     DVT prophylaxis: On apixaban  Code Status:  DNR/DNI Family Communication: called 2 daughter and 1 son listed in EPIC but no answer on any of the phones Disposition Plan: not yet stable for discharge, will repeat blood work in am, we also are awaiting PT eval, he will most likely go to SNF on discharge    Consultants:   WOC  PT  Procedures:  None  Antimicrobials:   Rocephin   Vanco and zosyn 3/20 --> 3/22   Subjective: No overnight events.   Objective: Vitals:   02/27/17 1700 02/27/17 2009 02/27/17 2123 02/28/17 0617  BP: (!) 91/36 117/65 (!) 120/56 (!) 135/58  Pulse: 79 69 63 77  Resp: (!) 30 (!) 24 20 20   Temp: 98.4 F (36.9 C) 98 F (36.7 C) 98.6 F (37 C) 98.7 F (37.1 C)  TempSrc: Oral Oral    SpO2: 97% 97% 95%   Weight:      Height:        Intake/Output Summary (Last 24 hours) at 02/28/17 1707 Last data filed at 02/28/17 9629  Gross per 24 hour  Intake              730 ml  Output              400 ml  Net              330 ml   Filed Weights   02/24/17 2102  Weight: 126.6 kg (279 lb)    Examination:  General exam: Appears calm and comfortable  Respiratory system: Clear to auscultation. Respiratory effort normal. Cardiovascular system: S1 & S2 heard, Rate controlled  Gastrointestinal system: Abdomen is nondistended, soft and nontender. No organomegaly or masses felt. Normal bowel sounds heard. Central nervous system: No focal neurological deficits. Extremities: Symmetric 5 x 5 power. Skin: has pressure boots on bilaterally  Psychiatry: Judgement and insight appear normal. Mood & affect appropriate.   Data Reviewed: I have personally reviewed following labs and imaging studies  CBC:  Recent Labs Lab 02/24/17 1421 02/25/17 0214 02/26/17 0334 02/27/17 0447 02/28/17 0410  WBC 8.4 10.3 7.5 8.3 8.1  NEUTROABS 6.1  --   --   --   --   HGB 8.0* 8.5* 6.6* 8.4* 7.9*  HCT 26.2* 29.3* 22.2* 28.1* 26.3*  MCV 93.2 95.8 94.9 94.0 94.6  PLT 344 284 276 291 281   Basic Metabolic  Panel:  Recent Labs Lab 02/24/17 1421 02/25/17 0214 02/26/17 0334 02/27/17 0447 02/28/17 0410  NA 142 140 143 141 142  K 3.5 3.7 3.2* 3.5 3.3*  CL 99* 101 102 101 102  CO2 33* 30 30 31 31   GLUCOSE 97 100* 81 103* 87  BUN 17 15 15 11 10   CREATININE 0.96 0.85 1.09 0.99 0.96  CALCIUM 8.8* 8.4* 8.5* 8.3* 8.4*  MG  --  1.9  --   --   --    GFR: Estimated Creatinine Clearance: 95.6 mL/min (by C-G formula based on SCr of 0.96 mg/dL). Liver Function  Tests:  Recent Labs Lab 02/24/17 1421 02/25/17 0214 02/26/17 0334 02/27/17 0447 02/28/17 0410  AST 24 17 14* 17 15  ALT 19 15* 12* 12* 11*  ALKPHOS 165* 141* 132* 139* 126  BILITOT 0.9 0.8 1.3* 1.2 1.0  PROT 6.1* 5.5* 5.1* 5.6* 5.4*  ALBUMIN 2.0* 1.8* 1.7* 1.7* 1.6*   No results for input(s): LIPASE, AMYLASE in the last 168 hours.  Recent Labs Lab 02/24/17 1421 02/27/17 1447  AMMONIA 19 51*   Coagulation Profile:  Recent Labs Lab 02/24/17 2109 02/26/17 0334  INR 1.71 1.70   Cardiac Enzymes: No results for input(s): CKTOTAL, CKMB, CKMBINDEX, TROPONINI in the last 168 hours. BNP (last 3 results) No results for input(s): PROBNP in the last 8760 hours. HbA1C: No results for input(s): HGBA1C in the last 72 hours. CBG: No results for input(s): GLUCAP in the last 168 hours. Lipid Profile: No results for input(s): CHOL, HDL, LDLCALC, TRIG, CHOLHDL, LDLDIRECT in the last 72 hours. Thyroid Function Tests: No results for input(s): TSH, T4TOTAL, FREET4, T3FREE, THYROIDAB in the last 72 hours. Anemia Panel: No results for input(s): VITAMINB12, FOLATE, FERRITIN, TIBC, IRON, RETICCTPCT in the last 72 hours. Urine analysis:    Component Value Date/Time   COLORURINE YELLOW 02/24/2017 1604   APPEARANCEUR CLOUDY (A) 02/24/2017 1604   LABSPEC 1.020 02/24/2017 1604   PHURINE 5.5 02/24/2017 1604   GLUCOSEU NEGATIVE 02/24/2017 1604   HGBUR LARGE (A) 02/24/2017 1604   BILIRUBINUR NEGATIVE 02/24/2017 1604   KETONESUR NEGATIVE  02/24/2017 1604   PROTEINUR 30 (A) 02/24/2017 1604   UROBILINOGEN 4.0 (H) 06/02/2012 1200   NITRITE POSITIVE (A) 02/24/2017 1604   LEUKOCYTESUR LARGE (A) 02/24/2017 1604   Sepsis Labs: @LABRCNTIP (procalcitonin:4,lacticidven:4)    Recent Results (from the past 240 hour(s))  Culture, blood (routine x 2)     Status: None (Preliminary result)   Collection Time: 02/24/17  2:21 PM  Result Value Ref Range Status   Specimen Description BLOOD RIGHT UPPER  Final   Special Requests BOTTLES DRAWN AEROBIC ONLY 5CC  Final   Culture NO GROWTH 4 DAYS  Final   Report Status PENDING  Incomplete  Culture, blood (routine x 2)     Status: None (Preliminary result)   Collection Time: 02/24/17  3:48 PM  Result Value Ref Range Status   Specimen Description BLOOD LEFT ANTECUBITAL  Final   Special Requests BOTTLES DRAWN AEROBIC ONLY 10CC  Final   Culture NO GROWTH 4 DAYS  Final   Report Status PENDING  Incomplete  Urine culture     Status: Abnormal   Collection Time: 02/24/17  4:01 PM  Result Value Ref Range Status   Specimen Description URINE, CATHETERIZED  Final   Special Requests NONE  Final   Culture >=100,000 COLONIES/mL PROVIDENCIA STUARTII (A)  Final   Report Status 02/26/2017 FINAL  Final   Organism ID, Bacteria PROVIDENCIA STUARTII (A)  Final      Susceptibility   Providencia stuartii - MIC*    AMPICILLIN >=32 RESISTANT Resistant     CEFAZOLIN >=64 RESISTANT Resistant     CEFTRIAXONE <=1 SENSITIVE Sensitive     CIPROFLOXACIN 2 INTERMEDIATE Intermediate     GENTAMICIN RESISTANT Resistant     IMIPENEM 2 SENSITIVE Sensitive     NITROFURANTOIN 128 RESISTANT Resistant     TRIMETH/SULFA >=320 RESISTANT Resistant     AMPICILLIN/SULBACTAM >=32 RESISTANT Resistant     PIP/TAZO <=4 SENSITIVE Sensitive     * >=100,000 COLONIES/mL PROVIDENCIA STUARTII  MRSA PCR  Screening     Status: None   Collection Time: 02/24/17  9:34 PM  Result Value Ref Range Status   MRSA by PCR NEGATIVE NEGATIVE Final     Comment:        The GeneXpert MRSA Assay (FDA approved for NASAL specimens only), is one component of a comprehensive MRSA colonization surveillance program. It is not intended to diagnose MRSA infection nor to guide or monitor treatment for MRSA infections.   C difficile quick scan w PCR reflex     Status: None   Collection Time: 02/25/17  9:53 AM  Result Value Ref Range Status   C Diff antigen NEGATIVE NEGATIVE Final   C Diff toxin NEGATIVE NEGATIVE Final   C Diff interpretation No C. difficile detected.  Final      Radiology Studies: Dg Chest Port 1 View Result Date: 02/26/2017 1. PICC line noted with tip projected over the cavoatrial junction. 2. Congestive heart failure with pulmonary interstitial edema and small right pleural effusion . Electronically Signed   By: Maisie Fus  Register   On: 02/26/2017 11:37    Scheduled Meds: . sodium chloride   Intravenous Once  . apixaban  5 mg Oral BID  . cefTRIAXone (ROCEPHIN)  IV  1 g Intravenous Q24H  . divalproex  250 mg Oral Q12H  . feeding supplement (ENSURE ENLIVE)  237 mL Oral BID BM  . feeding supplement (PRO-STAT SUGAR FREE 64)  30 mL Oral BID  . lactulose  20 g Oral BID  . mouth rinse  15 mL Mouth Rinse BID  . memantine  5 mg Oral BID  . pantoprazole  40 mg Oral QHS  . sodium chloride flush  10-40 mL Intracatheter Q12H  . sodium chloride flush  3 mL Intravenous Q12H   Continuous Infusions:   LOS: 4 days    Time spent: 25 minutes  Greater than 50% of the time spent on counseling and coordinating the care.   Manson Passey, MD [facility] Pager [phone]  If 7PM-7AM, please contact night-coverage [email or web address] Password [facility] 02/28/2017, 5:07 PM

## 2017-03-01 DIAGNOSIS — D649 Anemia, unspecified: Secondary | ICD-10-CM

## 2017-03-01 LAB — CULTURE, BLOOD (ROUTINE X 2)
CULTURE: NO GROWTH
Culture: NO GROWTH

## 2017-03-01 LAB — CBC
HCT: 27 % — ABNORMAL LOW (ref 39.0–52.0)
Hemoglobin: 7.9 g/dL — ABNORMAL LOW (ref 13.0–17.0)
MCH: 27.9 pg (ref 26.0–34.0)
MCHC: 29.3 g/dL — AB (ref 30.0–36.0)
MCV: 95.4 fL (ref 78.0–100.0)
PLATELETS: 248 10*3/uL (ref 150–400)
RBC: 2.83 MIL/uL — ABNORMAL LOW (ref 4.22–5.81)
RDW: 17.1 % — ABNORMAL HIGH (ref 11.5–15.5)
WBC: 6.5 10*3/uL (ref 4.0–10.5)

## 2017-03-01 LAB — BASIC METABOLIC PANEL
ANION GAP: 5 (ref 5–15)
BUN: 9 mg/dL (ref 6–20)
CALCIUM: 8.5 mg/dL — AB (ref 8.9–10.3)
CO2: 32 mmol/L (ref 22–32)
CREATININE: 0.87 mg/dL (ref 0.61–1.24)
Chloride: 101 mmol/L (ref 101–111)
GFR calc Af Amer: >60 mL/min (ref >60)
GLUCOSE: 89 mg/dL (ref 65–99)
Potassium: 3.8 mmol/L (ref 3.5–5.1)
Sodium: 138 mmol/L (ref 135–145)

## 2017-03-01 NOTE — Progress Notes (Signed)
[facility]                                                                              Patient Demographics  Jesus [name], is a 71 y.o. male, DOB - 1946/10/02, ZOX:[id number]  Admit date - 02/24/2017   Admitting Physician Michael LitterNikki Carter, MD  Outpatient Primary MD for the patient is [name],[name], [name]  Outpatient specialists:   LOS - 5  days    Chief Complaint  Patient presents with  . Altered Mental Status       Brief summary   70 y.o.male with a history of chronic atrial fibrillation (anticoagulated with Eliquis; CHADS-Vasc score of 4), CKD 3, chronic diastolic heart failure, HTN, chronic anemia, chronic pain, chronic venous insufficiency, chronic edema, cirrhosis of the liver by CT, incidental lesions on his liver and pancreas seen on prior CT imaging. He was recently hospitalized from 1/22-2/16 for aspiration pneumonia and infected sacral decubitus ulcer requiring surgical debridement. The wound ultimately required a wound vac and [facility] According to his daughter, he did well there, but has declined quickly [facility]. Patient found to have altered mental status and probable sepsis.   Assessment & Plan   Acute hypoxic and hypercarbic respiratory failure / Acute pulmonary edema - CXR on admission showed CHF  With pulmonary interstitial edema and small right pleural effusion - Initially on BiPAP - Stable respiratory status  - He was evaluated by SLP - recommendation was for regular diet  - Continue oxygen support via [place] to keep O2 sats above 90%  Sepsissecondary to catheter associated Providenicia stuartii UTI as well as cellulitis and superimposed infection involving chronic nonhealing sacral decubitus ulcer, both present on admission. - Urine cx grew Providencia - Blood cx so far showed no growth - Pt was on vanco and zosyn but this was narrowed down to Rocephin 3/22  Acute metabolic  encephalopathy - In the setting of sepsis, UTI. Currently oriented 3 but occasionally aggravated because he has pressure boots on and feels uncomfortable - Continue memantine   Mood disorder - Continue Depakote   Chronic atrial fibrillation / Coagulopathy  - CHADS vasc score 4 - Continue anticoagulation with apixaban - Rate controlled without beta blockers  Anemia of chronic disease - Due to bone marrow suppression from history of liver cirrhosis - Has had 2 units PRBC transfusion on 3/22 - Hgb 7.9, transfuse for hemoglobin less than 7.5  Diarrhea - C. difficile negative - No diarrhea in past   Hypokalemia - Due to sepsis, diarrhea - Currently stable  Chronic diastolic heart failure - Compensated - 2 D ECHO in 12/2014 showed EF 55-60%  Liver cirrhosis - Continue lactulose  - Continue protonix   Multiple wounds, sacral wounds, stage 4 sacral decubitus ulcer - Stage IV sacrum, partial thickness buttocks, bilateral pretibial and calves, left lat foot, left inner thigh. Unstageable to bilateral heels, and lateral plantar surface of R foot, DTI to L medial heel - Sacrum 15cm x 18cm x 6cm deep stage 4 - Buttocks with multiple various size partial thickness wounds - Unstageable on Lateral  plantar surface of R foot 1.5cm x 2cm x 0cm  - Unstageable R heel 2.5cm x 6cm x 0cm - R lateral calf 6cm x 3cm x 0.1cm partial thickness 5 - Proximal to right ankle 2.5cm x 0.5cm x 0cm dark purple.  - Two intact blisters to inner left thigh 6cm x 1.5cm and 3cm x 1cm - Left lateral calf 6cm x 3cm x 0.1cm partial thickness and one 3cm  - Left medial heel 2.5cm x 2.5cm  - Left lateral foot 4cm x 4cm and a 2cm x 4cm  - Left posterior calf with 4cm x 3cm x 0cm  - Dressing procedure/placement/frequency: NS moistened W to D dressing BID for sacral ulcer. Xeroform and foam to wounds on heels, foam to intact blisters, xeroform and gauze to partial thickness on BLE calves. Pt is already on a  specialty bed. Pt already has pressure alleviating boots on.   Code Status: DNR  DVT Prophylaxis:  apixaban Family Communication: Discussed in detail with the patient, all imaging results, lab results explained to the patient   Disposition Plan: *will need SNF  Time Spent in minutes   25  minutes  Procedures:    Consultants:   Palliative care  Antimicrobials:   Rocephin   Vanco and zosyn 3/20 --> 3/22    Medications  Scheduled Meds: . sodium chloride   Intravenous Once  . apixaban  5 mg Oral BID  . cefTRIAXone (ROCEPHIN)  IV  1 g Intravenous Q24H  . divalproex  250 mg Oral Q12H  . feeding supplement (ENSURE ENLIVE)  237 mL Oral BID BM  . feeding supplement (PRO-STAT SUGAR FREE 64)  30 mL Oral BID  . lactulose  20 g Oral BID  . mouth rinse  15 mL Mouth Rinse BID  . memantine  5 mg Oral BID  . pantoprazole  40 mg Oral QHS  . sodium chloride flush  10-40 mL Intracatheter Q12H  . sodium chloride flush  3 mL Intravenous Q12H   Continuous Infusions: PRN Meds:.acetaminophen **OR** acetaminophen, fentaNYL (SUBLIMAZE) injection, HYDROcodone-acetaminophen, hydrocortisone cream, ondansetron **OR** ondansetron (ZOFRAN) IV, sodium chloride flush   Antibiotics   Anti-infectives    Start     Dose/Rate Route Frequency Ordered Stop   02/27/17 1400  cefTRIAXone (ROCEPHIN) 1 g in dextrose 5 % 50 mL IVPB     1 g 100 mL/hr over 30 Minutes Intravenous Every 24 hours 02/27/17 1245     02/26/17 2000  vancomycin (VANCOCIN) IVPB 1000 mg/200 mL premix  Status:  Discontinued     1,000 mg 200 mL/hr over 60 Minutes Intravenous Every 24 hours 02/26/17 0809 02/27/17 1245   02/25/17 0800  vancomycin (VANCOCIN) IVPB 1000 mg/200 mL premix  Status:  Discontinued     1,000 mg 200 mL/hr over 60 Minutes Intravenous Every 12 hours 02/24/17 2102 02/26/17 0809   02/25/17 0600  piperacillin-tazobactam (ZOSYN) IVPB 3.375 g  Status:  Discontinued     3.375 g 12.5 mL/hr over 240 Minutes Intravenous  Every 8 hours 02/24/17 2102 02/27/17 1245   02/24/17 1930  vancomycin (VANCOCIN) 2,000 mg in sodium chloride 0.9 % 500 mL IVPB     2,000 mg 250 mL/hr over 120 Minutes Intravenous  Once 02/24/17 1915 02/24/17 2201   02/24/17 1915  piperacillin-tazobactam (ZOSYN) IVPB 3.375 g     3.375 g 100 mL/hr over 30 Minutes Intravenous  Once 02/24/17 1907 02/24/17 2359   02/24/17 1915  vancomycin (VANCOCIN) IVPB 1000 mg/200 mL premix  Status:  Discontinued     1,000 mg 200 mL/hr over 60 Minutes Intravenous  Once 02/24/17 1907 02/24/17 1915   02/24/17 1815  cefTRIAXone (ROCEPHIN) injection 1 g  Status:  Discontinued     1 g Intramuscular  Once 02/24/17 1801 02/24/17 1907        Subjective:   Hamdan Toscano was seen and examined today.  Patient denies dizziness, chest pain, shortness of breath, abdominal pain, N/V/D/C, new weakness, numbess, tingling. No acute events overnight.    Objective:   Vitals:   02/27/17 2123 02/28/17 0617 02/28/17 2126 03/01/17 0523  BP: (!) 120/56 (!) 135/58 138/73 (!) 146/67  Pulse: 63 77 72 69  Resp: 20 20 18 18   Temp: 98.6 F (37 C) 98.7 F (37.1 C) 98.4 F (36.9 C) 98 F (36.7 C)  TempSrc:   Oral Oral  SpO2: 95%  (!) 87% 95%  Weight:      Height:        Intake/Output Summary (Last 24 hours) at 03/01/17 1212 Last data filed at 03/01/17 0523  Gross per 24 hour  Intake               50 ml  Output              600 ml  Net             -550 ml     Wt Readings from Last 3 Encounters:  02/24/17 126.6 kg (279 lb)  02/17/17 136.1 kg (300 lb)  02/16/17 (!) 136.2 kg (300 lb 3.2 oz)     Exam  General: Alert and oriented x 3, NAD  HEENT:   Neck: Supple, no JVD, no masses  Cardiovascular: S1 S2 auscultated, no rubs, murmurs or gallops. Regular rate and rhythm.  Respiratory: Clear to auscultation bilaterally, no wheezing, rales or rhonchi  Gastrointestinal: Soft, nontender, nondistended, + bowel sounds  Ext: no cyanosis clubbing or  edema  Neuro: AAOx3, Cr N's II- XII. Strength 5/5 upper and lower extremities bilaterally  Skin: Pressure boots on bilaterally on the heels  Psych: Normal affect and demeanor, alert and oriented x3    Data Reviewed:  I have personally reviewed following labs and imaging studies  Micro Results Recent Results (from the past 240 hour(s))  Culture, blood (routine x 2)     Status: None (Preliminary result)   Collection Time: 02/24/17  2:21 PM  Result Value Ref Range Status   Specimen Description BLOOD RIGHT UPPER  Final   Special Requests BOTTLES DRAWN AEROBIC ONLY 5CC  Final   Culture NO GROWTH 4 DAYS  Final   Report Status PENDING  Incomplete  Culture, blood (routine x 2)     Status: None (Preliminary result)   Collection Time: 02/24/17  3:48 PM  Result Value Ref Range Status   Specimen Description BLOOD LEFT ANTECUBITAL  Final   Special Requests BOTTLES DRAWN AEROBIC ONLY 10CC  Final   Culture NO GROWTH 4 DAYS  Final   Report Status PENDING  Incomplete  Urine culture     Status: Abnormal   Collection Time: 02/24/17  4:01 PM  Result Value Ref Range Status   Specimen Description URINE, CATHETERIZED  Final   Special Requests NONE  Final   Culture >=100,000 COLONIES/mL PROVIDENCIA STUARTII (A)  Final   Report Status 02/26/2017 FINAL  Final   Organism ID, Bacteria PROVIDENCIA STUARTII (A)  Final      Susceptibility   Providencia stuartii - MIC*    AMPICILLIN >=  32 RESISTANT Resistant     CEFAZOLIN >=64 RESISTANT Resistant     CEFTRIAXONE <=1 SENSITIVE Sensitive     CIPROFLOXACIN 2 INTERMEDIATE Intermediate     GENTAMICIN RESISTANT Resistant     IMIPENEM 2 SENSITIVE Sensitive     NITROFURANTOIN 128 RESISTANT Resistant     TRIMETH/SULFA >=320 RESISTANT Resistant     AMPICILLIN/SULBACTAM >=32 RESISTANT Resistant     PIP/TAZO <=4 SENSITIVE Sensitive     * >=100,000 COLONIES/mL PROVIDENCIA STUARTII  MRSA PCR Screening     Status: None   Collection Time: 02/24/17  9:34 PM  Result  Value Ref Range Status   MRSA by PCR NEGATIVE NEGATIVE Final    Comment:        The GeneXpert MRSA Assay (FDA approved for NASAL specimens only), is one component of a comprehensive MRSA colonization surveillance program. It is not intended to diagnose MRSA infection nor to guide or monitor treatment for MRSA infections.   C difficile quick scan w PCR reflex     Status: None   Collection Time: 02/25/17  9:53 AM  Result Value Ref Range Status   C Diff antigen NEGATIVE NEGATIVE Final   C Diff toxin NEGATIVE NEGATIVE Final   C Diff interpretation No C. difficile detected.  Final    Radiology Reports Dg Chest Port 1 View  Result Date: 02/26/2017 CLINICAL DATA:  PICC line. EXAM: PORTABLE CHEST 1 VIEW COMPARISON:  02/24/2017 . FINDINGS: PICC line noted with tip over the cavoatrial junction. Cardiomegaly with pulmonary interstitial prominence. Small right pleural effusion. No pneumothorax . IMPRESSION: 1. PICC line noted with tip projected over the cavoatrial junction. 2. Congestive heart failure with pulmonary interstitial edema and small right pleural effusion . Electronically Signed   By: Maisie Fus  Register   On: 02/26/2017 11:37   Dg Chest Port 1 View  Result Date: 02/24/2017 CLINICAL DATA:  Altered mental status EXAM: PORTABLE CHEST 1 VIEW COMPARISON:  January 20, 2017 FINDINGS: There is persistent cardiomegaly. There is pulmonary venous hypertension. There is no edema or consolidation. No adenopathy. No bone lesions. IMPRESSION: Pulmonary vascular congestion without edema or consolidation. Electronically Signed   By: Bretta Bang III M.D.   On: 02/24/2017 15:08    Lab Data:  CBC:  Recent Labs Lab 02/24/17 1421 02/25/17 0214 02/26/17 0334 02/27/17 0447 02/28/17 0410 03/01/17 0400  WBC 8.4 10.3 7.5 8.3 8.1 6.5  NEUTROABS 6.1  --   --   --   --   --   HGB 8.0* 8.5* 6.6* 8.4* 7.9* 7.9*  HCT 26.2* 29.3* 22.2* 28.1* 26.3* 27.0*  MCV 93.2 95.8 94.9 94.0 94.6 95.4  PLT 344  284 276 291 281 248   Basic Metabolic Panel:  Recent Labs Lab 02/25/17 0214 02/26/17 0334 02/27/17 0447 02/28/17 0410 03/01/17 0400  NA 140 143 141 142 138  K 3.7 3.2* 3.5 3.3* 3.8  CL 101 102 101 102 101  CO2 30 30 31 31  32  GLUCOSE 100* 81 103* 87 89  BUN 15 15 11 10 9   CREATININE 0.85 1.09 0.99 0.96 0.87  CALCIUM 8.4* 8.5* 8.3* 8.4* 8.5*  MG 1.9  --   --   --   --    GFR: Estimated Creatinine Clearance: 105.5 mL/min (by C-G formula based on SCr of 0.87 mg/dL). Liver Function Tests:  Recent Labs Lab 02/24/17 1421 02/25/17 0214 02/26/17 0334 02/27/17 0447 02/28/17 0410  AST 24 17 14* 17 15  ALT 19 15* 12* 12* 11*  ALKPHOS 165* 141* 132* 139* 126  BILITOT 0.9 0.8 1.3* 1.2 1.0  PROT 6.1* 5.5* 5.1* 5.6* 5.4*  ALBUMIN 2.0* 1.8* 1.7* 1.7* 1.6*   No results for input(s): LIPASE, AMYLASE in the last 168 hours.  Recent Labs Lab 02/24/17 1421 02/27/17 1447  AMMONIA 19 51*   Coagulation Profile:  Recent Labs Lab 02/24/17 2109 02/26/17 0334  INR 1.71 1.70   Cardiac Enzymes: No results for input(s): CKTOTAL, CKMB, CKMBINDEX, TROPONINI in the last 168 hours. BNP (last 3 results) No results for input(s): PROBNP in the last 8760 hours. HbA1C: No results for input(s): HGBA1C in the last 72 hours. CBG: No results for input(s): GLUCAP in the last 168 hours. Lipid Profile: No results for input(s): CHOL, HDL, LDLCALC, TRIG, CHOLHDL, LDLDIRECT in the last 72 hours. Thyroid Function Tests: No results for input(s): TSH, T4TOTAL, FREET4, T3FREE, THYROIDAB in the last 72 hours. Anemia Panel: No results for input(s): VITAMINB12, FOLATE, FERRITIN, TIBC, IRON, RETICCTPCT in the last 72 hours. Urine analysis:    Component Value Date/Time   COLORURINE YELLOW 02/24/2017 1604   APPEARANCEUR CLOUDY (A) 02/24/2017 1604   LABSPEC 1.020 02/24/2017 1604   PHURINE 5.5 02/24/2017 1604   GLUCOSEU NEGATIVE 02/24/2017 1604   HGBUR LARGE (A) 02/24/2017 1604   BILIRUBINUR NEGATIVE  02/24/2017 1604   KETONESUR NEGATIVE 02/24/2017 1604   PROTEINUR 30 (A) 02/24/2017 1604   UROBILINOGEN 4.0 (H) 06/02/2012 1200   NITRITE POSITIVE (A) 02/24/2017 1604   LEUKOCYTESUR LARGE (A) 02/24/2017 1604     RAI,RIPUDEEP M.D. [facility] 03/01/2017, 12:12 PM  Pager: [phone] Between 7am to 7pm - call Pager - [phone]  After 7pm go to [email or web address] - password [email or web address]  Call night coverage person covering after 7pm

## 2017-03-01 NOTE — Evaluation (Signed)
Physical Therapy Evaluation Patient Details Name: Jesus [name] MRN: [id number] DOB: 03-09-1946 Today's Date: 03/01/2017   History of Present Illness  Patient is a 71 yo male admitted 02/24/17 from [facility] with acute respiratory failure with hypoxia and hypercapnia, AMS, sepsis  UTI/wounds.    PMH:  memory loss, sacral and LE ulcers, CKD, Afib, anemia, CHF, PAD, morbid obesity, HTN  Clinical Impression  Patient presents with problems listed below.  Will benefit from acute PT to maximize functional mobility prior to d/c to SNF for continued therapy.      Follow Up Recommendations SNF;Supervision/Assistance - 24 hour    Equipment Recommendations  None recommended by PT    Recommendations for Other Services       Precautions / Restrictions Precautions Precautions: Fall Restrictions Weight Bearing Restrictions: No      Mobility  Bed Mobility Overal bed mobility: Needs Assistance Bed Mobility: Rolling Rolling: Total assist;+2 for physical assistance         General bed mobility comments: Verbal cues for use of UE's to roll.  Patient unable to initiate any aspect of rolling.  Difficulty following directions.  With +2 total assist, able to move patient minimally toward sidelying.  Transfers                 General transfer comment: NT  Ambulation/Gait                Stairs            Wheelchair Mobility    Modified Rankin (Stroke Patients Only)       Balance                                             Pertinent Vitals/Pain Pain Assessment: Faces Faces Pain Scale: Hurts whole lot Pain Location: Rt knee/LE, buttocks Pain Descriptors / Indicators: Aching;Sore Pain Intervention(s): Limited activity within patient's tolerance;Monitored during session    Home Living Family/patient expects to be discharged to:: Skilled nursing facility                 Additional Comments: Return to [facility]    Prior  Function Level of Independence: Needs assistance               Hand Dominance        Extremity/Trunk Assessment   Upper Extremity Assessment Upper Extremity Assessment: Generalized weakness    Lower Extremity Assessment Lower Extremity Assessment: RLE deficits/detail;LLE deficits/detail RLE Deficits / Details: Patient unwilling to attempt active or passive movement of RLE due to pain.  RLE positioned with hip and knee flexion and externally rotated. RLE: Unable to fully assess due to pain LLE Deficits / Details: Strength grossly 2+/5 - 3-/5       Communication   Communication: No difficulties  Cognition Arousal/Alertness: Awake/alert Behavior During Therapy: Restless Overall Cognitive Status: No family/caregiver present to determine baseline cognitive functioning                                 General Comments: Patient having difficulty remaining on task.  Conversation scattered.  Unable to provide accurate information regarding PLOF or where he was pta.      General Comments      Exercises     Assessment/Plan    PT Assessment Patient needs  continued PT services  PT Problem List Decreased strength;Decreased activity tolerance;Decreased range of motion;Decreased balance;Decreased mobility;Decreased cognition;Decreased knowledge of use of DME;Cardiopulmonary status limiting activity;Pain;Obesity;Decreased skin integrity       PT Treatment Interventions Functional mobility training;Therapeutic activities;Therapeutic exercise;Patient/family education    PT Goals (Current goals can be found in the Care Plan section)  Acute Rehab PT Goals Patient Stated Goal: None stated PT Goal Formulation: With patient Time For Goal Achievement: 03/15/17 Potential to Achieve Goals: Fair    Frequency Min 2X/week   Barriers to discharge        Co-evaluation               End of Session   Activity Tolerance: Patient limited by pain;Patient limited by  fatigue Patient left: in bed;with call bell/phone within reach   PT Visit Diagnosis: Muscle weakness (generalized) (M62.81);Pain;Difficulty in walking, not elsewhere classified (R26.2) Pain - Right/Left: Right Pain - part of body: Leg    Time: [id number] PT Time Calculation (min) (ACUTE ONLY): 13 min   Charges:   PT Evaluation $PT Eval Moderate Complexity: 1 Procedure     PT G Codes:        Durenda HurtSusan H. Renaldo Fiddleravis, PT, [facility] Acute Rehab Services Pager [phone]   Vena AustriaSusan H Christropher Gintz 03/01/2017, 8:35 PM

## 2017-03-02 LAB — BASIC METABOLIC PANEL
Anion gap: 8 (ref 5–15)
BUN: 10 mg/dL (ref 6–20)
CO2: 33 mmol/L — ABNORMAL HIGH (ref 22–32)
CREATININE: 0.84 mg/dL (ref 0.61–1.24)
Calcium: 8.7 mg/dL — ABNORMAL LOW (ref 8.9–10.3)
Chloride: 99 mmol/L — ABNORMAL LOW (ref 101–111)
GFR calc non Af Amer: >60 mL/min (ref >60)
Glucose, Bld: 95 mg/dL (ref 65–99)
POTASSIUM: 3.8 mmol/L (ref 3.5–5.1)
SODIUM: 140 mmol/L (ref 135–145)

## 2017-03-02 LAB — CBC
HCT: 28.1 % — ABNORMAL LOW (ref 39.0–52.0)
Hemoglobin: 8.4 g/dL — ABNORMAL LOW (ref 13.0–17.0)
MCH: 28.5 pg (ref 26.0–34.0)
MCHC: 29.9 g/dL — AB (ref 30.0–36.0)
MCV: 95.3 fL (ref 78.0–100.0)
PLATELETS: 260 10*3/uL (ref 150–400)
RBC: 2.95 MIL/uL — AB (ref 4.22–5.81)
RDW: 16.9 % — ABNORMAL HIGH (ref 11.5–15.5)
WBC: 6.8 10*3/uL (ref 4.0–10.5)

## 2017-03-02 LAB — GLUCOSE, CAPILLARY: GLUCOSE-CAPILLARY: 129 mg/dL — AB (ref 65–99)

## 2017-03-02 NOTE — Progress Notes (Signed)
Patient asked staff to call [name] and let her know that he wants to talk with her. RN called his daughter and she said she will call him as soon as she can. Will continue to monitor.

## 2017-03-02 NOTE — Clinical Social Work Note (Signed)
Clinical Social Work Assessment  Patient Details  Name: Jesus [name] MRN: [id number] Date of Birth: Nov 12, 1946  Date of referral:  03/02/17               Reason for consult:  Facility Placement                Permission sought to share information with:  Oceanographeracility Contact Representative Permission granted to share information::  Yes, Verbal Permission Granted  [facility]  Agency::  SNF  Relationship::  dtr  Contact Information:     Housing/Transportation Living arrangements for the past 2 months:  [facility]illed [facility]urs[facility] Facility Source of Information:  Patient Patient Interpreter Needed:  None Criminal Activity/Legal Involvement Pertinent to Current Situation/Hospitalization:  No - Comment as needed Significant Relationships:  Adult Children Lives with:  Self Do you feel safe going back to the place where you live?  No Need for family participation in patient care:  Yes (Comment) (decision making)  Care giving concerns:  None [facility]   Social Worker assessment / plan:  CSW spoke with pt concerning return to SNF at time of DC- pt confirmed that he would need SNF but was unsure where he wanted to go at time of DC.  Pt also states his daugher is helping with discharge plan [facility]([name]) and provided verbal permission for CSW to [facility].  Pt also spoke about feeling "blue" because his family doesn't contact him enough- feels like he is not getting enough human contact- CSW requested chaplain consult for support with patient permission.  Employment status:  Retired Database administratornsurance information:  Managed Medicare PT Recommendations:  Skilled Nursing Facility Information / Referral to community resources:  Skilled Nursing Facility  Patient/Family's Response to care:  Pt is agreeable to placement again and acknowledges he is not safe to go home. Pt dtr was hopeful for placement [place] but is agreeable to pt  return to [facility] [place] since it is the closest facility with good reviews.  Patient/Family's Understanding of and Emotional Response to Diagnosis, Current Treatment, and Prognosis:  Unclear pt understanding at this time- he is having some anxiety here in the hospital and does not seem to clearly comprehend his condition.  Emotional Assessment Appearance:  Appears stated age Attitude/Demeanor/Rapport:    Affect (typically observed):  Appropriate Orientation:  Oriented to  Time, Oriented to Place, Oriented to Self Alcohol / Substance use:  Not Applicable Psych involvement (Current and /or in the community):  No (Comment)  Discharge Needs  Concerns to be addressed:  Care Coordination Readmission within the last 30 days:  No Current discharge risk:  Physical Impairment Barriers to Discharge:  Continued Medical Work up   Burna SisUris, Kamilah Correia H, LCSW 03/02/2017, 1:23 PM

## 2017-03-02 NOTE — Progress Notes (Signed)
   03/02/17 1055  Clinical Encounter Type  Visited With Patient  Visit Type Psychological support  Referral From Social work  Spiritual Encounters  Spiritual Needs Emotional  Stress Factors  Patient Stress Factors Health changes;Family relationships  Page from [employer] FinancialSW of Pt feeling depressed. Introduction to Pt. Listened to his story empathically. Pt wanting number to his bank and health care staff assisted him.

## 2017-03-02 NOTE — Progress Notes (Signed)
Sats 88% on room air, placed on oxygen  @  2L nasal cannula, sats increased to 94%

## 2017-03-02 NOTE — Care Management Important Message (Signed)
Important Message  Patient Details  Name: [name] MRN: [id number] Date of Birth: 09/22/1946   Medicare Important Message Given:  Yes    Kyla BalzarineShealy, Maigen Mozingo Abena 03/02/2017, 4:09 PM

## 2017-03-02 NOTE — Progress Notes (Signed)
[facility]                                                                              Patient Demographics  Jesus [name], is a 71 y.o. male, DOB - 04-Oct-1946, ZOX:[id number]  Admit date - 02/24/2017   Admitting Physician Michael Litter, MD  Outpatient Primary MD for the patient is [name],[name], [name]  Outpatient specialists:   LOS - 6  days    Chief Complaint  Patient presents with  . Altered Mental Status       Brief summary   70 y.o.male with a history of chronic atrial fibrillation (anticoagulated with Eliquis; CHADS-Vasc score of 4), CKD 3, chronic diastolic heart failure, HTN, chronic anemia, chronic pain, chronic venous insufficiency, chronic edema, cirrhosis of the liver by CT, incidental lesions on his liver and pancreas seen on prior CT imaging. He was recently hospitalized from 1/22-2/16 for aspiration pneumonia and infected sacral decubitus ulcer requiring surgical debridement. The wound ultimately required a wound vac and he was referred to [facility]. According to his daughter, he did well there, but has declined quickly since being back at [facility] for less than one week. Patient found to have altered mental status and probable sepsis.   Assessment & Plan   Acute hypoxic and hypercarbic respiratory failure / Acute pulmonary edema - CXR on admission showed CHF with pulmonary interstitial edema and small right pleural effusion - Initially on BiPAP - Stable respiratory status  - He was evaluated by SLP - recommendation was for regular diet  - Continue oxygen support via [place] to keep O2 sats above 90%  Sepsissecondary to catheter associated Providenicia stuartii UTI as well as cellulitis and superimposed infection involving chronic nonhealing sacral decubitus ulcer, both present on admission. - Urine cx grew Providencia, Blood cx so far showed no growth - Pt was on vanco and zosyn but this was narrowed down to Rocephin 3/22  Acute metabolic  encephalopathy - In the setting of sepsis, UTI. Currently oriented 3 but occasionally aggravated because he has pressure boots on and feels uncomfortable - Continue memantine   Mood disorder - Continue Depakote   Chronic atrial fibrillation / Coagulopathy  - CHADS vasc score 4 - Continue anticoagulation with apixaban - Rate controlled without beta blockers  Anemia of chronic disease - Due to bone marrow suppression from history of liver cirrhosis - Has had 2 units PRBC transfusion on 3/22 - Hemoglobin stable, transfuse for hemoglobin less than 7.5  Diarrhea - C. difficile negative - No diarrhea in past   Hypokalemia - Due to sepsis, diarrhea, Currently stable  Chronic diastolic heart failure - Compensated - 2 D ECHO in 12/2014 showed EF 55-60%  Liver cirrhosis - Continue lactulose  - Continue protonix   Multiple wounds, sacral wounds, stage 4 sacral decubitus ulcer - Stage IV sacrum, partial thickness buttocks, bilateral pretibial and calves, left lat foot, left inner thigh. Unstageable to bilateral heels, and lateral plantar surface of R foot, DTI to L medial heel - Sacrum 15cm x 18cm x 6cm deep stage 4 - Buttocks with multiple various size partial thickness wounds - Unstageable on Lateral plantar surface of  R foot 1.5cm x 2cm x 0cm  - Unstageable R heel 2.5cm x 6cm x 0cm - R lateral calf 6cm x 3cm x 0.1cm partial thickness 5 - Proximal to right ankle 2.5cm x 0.5cm x 0cm dark purple.  - Two intact blisters to inner left thigh 6cm x 1.5cm and 3cm x 1cm - Left lateral calf 6cm x 3cm x 0.1cm partial thickness and one 3cm  - Left medial heel 2.5cm x 2.5cm  - Left lateral foot 4cm x 4cm and a 2cm x 4cm  - Left posterior calf with 4cm x 3cm x 0cm  - Dressing procedure/placement/frequency: NS moistened W to D dressing BID for sacral ulcer. Xeroform and foam to wounds on heels, foam to intact blisters, xeroform and gauze to partial thickness on BLE calves. Pt is  already on a specialty bed. Pt already has pressure alleviating boots on.   Code Status: DNR  DVT Prophylaxis:  apixaban Family Communication: Discussed in detail with the patient, all imaging results, lab results explained to the patient   Disposition Plan: *will need SNF, likely in next 24 hours  Time Spent in minutes   25  minutes  Procedures:    Consultants:   Palliative care  Antimicrobials:   Rocephin   Vanco and zosyn 3/20 --> 3/22    Medications  Scheduled Meds: . sodium chloride   Intravenous Once  . apixaban  5 mg Oral BID  . cefTRIAXone (ROCEPHIN)  IV  1 g Intravenous Q24H  . divalproex  250 mg Oral Q12H  . feeding supplement (ENSURE ENLIVE)  237 mL Oral BID BM  . feeding supplement (PRO-STAT SUGAR FREE 64)  30 mL Oral BID  . lactulose  20 g Oral BID  . mouth rinse  15 mL Mouth Rinse BID  . memantine  5 mg Oral BID  . pantoprazole  40 mg Oral QHS  . sodium chloride flush  10-40 mL Intracatheter Q12H  . sodium chloride flush  3 mL Intravenous Q12H   Continuous Infusions: PRN Meds:.acetaminophen **OR** acetaminophen, fentaNYL (SUBLIMAZE) injection, HYDROcodone-acetaminophen, hydrocortisone cream, ondansetron **OR** ondansetron (ZOFRAN) IV, sodium chloride flush   Antibiotics   Anti-infectives    Start     Dose/Rate Route Frequency Ordered Stop   02/27/17 1400  cefTRIAXone (ROCEPHIN) 1 g in dextrose 5 % 50 mL IVPB     1 g 100 mL/hr over 30 Minutes Intravenous Every 24 hours 02/27/17 1245     02/26/17 2000  vancomycin (VANCOCIN) IVPB 1000 mg/200 mL premix  Status:  Discontinued     1,000 mg 200 mL/hr over 60 Minutes Intravenous Every 24 hours 02/26/17 0809 02/27/17 1245   02/25/17 0800  vancomycin (VANCOCIN) IVPB 1000 mg/200 mL premix  Status:  Discontinued     1,000 mg 200 mL/hr over 60 Minutes Intravenous Every 12 hours 02/24/17 2102 02/26/17 0809   02/25/17 0600  piperacillin-tazobactam (ZOSYN) IVPB 3.375 g  Status:  Discontinued     3.375 g 12.5  mL/hr over 240 Minutes Intravenous Every 8 hours 02/24/17 2102 02/27/17 1245   02/24/17 1930  vancomycin (VANCOCIN) 2,000 mg in sodium chloride 0.9 % 500 mL IVPB     2,000 mg 250 mL/hr over 120 Minutes Intravenous  Once 02/24/17 1915 02/24/17 2201   02/24/17 1915  piperacillin-tazobactam (ZOSYN) IVPB 3.375 g     3.375 g 100 mL/hr over 30 Minutes Intravenous  Once 02/24/17 1907 02/24/17 2359   02/24/17 1915  vancomycin (VANCOCIN) IVPB 1000 mg/200 mL premix  Status:  Discontinued     1,000 mg 200 mL/hr over 60 Minutes Intravenous  Once 02/24/17 1907 02/24/17 1915   02/24/17 1815  cefTRIAXone (ROCEPHIN) injection 1 g  Status:  Discontinued     1 g Intramuscular  Once 02/24/17 1801 02/24/17 1907        Subjective:   Jesus [name] was seen and examined today.  Denies any specific complaints this morning. Dates he lives alone and will not be able to care for himself. Patient denies dizziness, chest pain, shortness of breath, abdominal pain, N/V/D/C, new weakness, numbess, tingling. No acute events overnight.    Objective:   Vitals:   03/01/17 0523 03/01/17 1316 03/01/17 2305 03/02/17 0603  BP: (!) 146/67 136/62 (!) 146/123 (!) 161/80  Pulse: 69 78 93 83  Resp: 18 18 18 18   Temp: 98 F (36.7 C) 98.5 F (36.9 C) 98.8 F (37.1 C) 97.3 F (36.3 C)  TempSrc: Oral Oral Oral Oral  SpO2: 95% 93% 94% 91%  Weight:      Height:        Intake/Output Summary (Last 24 hours) at 03/02/17 1207 Last data filed at 03/02/17 0701  Gross per 24 hour  Intake               50 ml  Output              875 ml  Net             -825 ml     Wt Readings from Last 3 Encounters:  02/24/17 126.6 kg (279 lb)  02/17/17 136.1 kg (300 lb)  02/16/17 (!) 136.2 kg (300 lb 3.2 oz)     Exam  General: Alert and oriented x 3, NAD  HEENT:   Neck: Supple, no JVD, no masses  Cardiovascular: S1 S2 auscultated, no rubs, murmurs or gallops. Regular rate and rhythm.  Respiratory: Clear to auscultation  bilaterally, no wheezing, rales or rhonchi  Gastrointestinal: Soft, nontender, nondistended, + bowel sounds  Ext: no cyanosis clubbing or edema  Neuro: AAOx3, Cr N's II- XII. Strength 5/5 upper and lower extremities bilaterally  Skin: Pressure boots on bilaterally on the heels  Psych: Normal affect and demeanor, alert and oriented x3    Data Reviewed:  I have personally reviewed following labs and imaging studies  Micro Results Recent Results (from the past 240 hour(s))  Culture, blood (routine x 2)     Status: None   Collection Time: 02/24/17  2:21 PM  Result Value Ref Range Status   Specimen Description BLOOD RIGHT UPPER  Final   Special Requests BOTTLES DRAWN AEROBIC ONLY 5CC  Final   Culture NO GROWTH 5 DAYS  Final   Report Status 03/01/2017 FINAL  Final  Culture, blood (routine x 2)     Status: None   Collection Time: 02/24/17  3:48 PM  Result Value Ref Range Status   Specimen Description BLOOD LEFT ANTECUBITAL  Final   Special Requests BOTTLES DRAWN AEROBIC ONLY 10CC  Final   Culture NO GROWTH 5 DAYS  Final   Report Status 03/01/2017 FINAL  Final  Urine culture     Status: Abnormal   Collection Time: 02/24/17  4:01 PM  Result Value Ref Range Status   Specimen Description URINE, CATHETERIZED  Final   Special Requests NONE  Final   Culture >=100,000 COLONIES/mL PROVIDENCIA STUARTII (A)  Final   Report Status 02/26/2017 FINAL  Final   Organism ID, Bacteria PROVIDENCIA STUARTII (A)  Final      Susceptibility   Providencia stuartii - MIC*    AMPICILLIN >=32 RESISTANT Resistant     CEFAZOLIN >=64 RESISTANT Resistant     CEFTRIAXONE <=1 SENSITIVE Sensitive     CIPROFLOXACIN 2 INTERMEDIATE Intermediate     GENTAMICIN RESISTANT Resistant     IMIPENEM 2 SENSITIVE Sensitive     NITROFURANTOIN 128 RESISTANT Resistant     TRIMETH/SULFA >=320 RESISTANT Resistant     AMPICILLIN/SULBACTAM >=32 RESISTANT Resistant     PIP/TAZO <=4 SENSITIVE Sensitive     * >=100,000  COLONIES/mL PROVIDENCIA STUARTII  MRSA PCR Screening     Status: None   Collection Time: 02/24/17  9:34 PM  Result Value Ref Range Status   MRSA by PCR NEGATIVE NEGATIVE Final    Comment:        The GeneXpert MRSA Assay (FDA approved for NASAL specimens only), is one component of a comprehensive MRSA colonization surveillance program. It is not intended to diagnose MRSA infection nor to guide or monitor treatment for MRSA infections.   C difficile quick scan w PCR reflex     Status: None   Collection Time: 02/25/17  9:53 AM  Result Value Ref Range Status   C Diff antigen NEGATIVE NEGATIVE Final   C Diff toxin NEGATIVE NEGATIVE Final   C Diff interpretation No C. difficile detected.  Final    Radiology Reports Dg Chest Port 1 View  Result Date: 02/26/2017 CLINICAL DATA:  PICC line. EXAM: PORTABLE CHEST 1 VIEW COMPARISON:  02/24/2017 . FINDINGS: PICC line noted with tip over the cavoatrial junction. Cardiomegaly with pulmonary interstitial prominence. Small right pleural effusion. No pneumothorax . IMPRESSION: 1. PICC line noted with tip projected over the cavoatrial junction. 2. Congestive heart failure with pulmonary interstitial edema and small right pleural effusion . Electronically Signed   By: Maisie Fushomas  Register   On: 02/26/2017 11:37   Dg Chest Port 1 View  Result Date: 02/24/2017 CLINICAL DATA:  Altered mental status EXAM: PORTABLE CHEST 1 VIEW COMPARISON:  January 20, 2017 FINDINGS: There is persistent cardiomegaly. There is pulmonary venous hypertension. There is no edema or consolidation. No adenopathy. No bone lesions. IMPRESSION: Pulmonary vascular congestion without edema or consolidation. Electronically Signed   By: Bretta BangWilliam  Woodruff III M.D.   On: 02/24/2017 15:08    Lab Data:  CBC:  Recent Labs Lab 02/24/17 1421  02/26/17 0334 02/27/17 0447 02/28/17 0410 03/01/17 0400 03/02/17 0431  WBC 8.4  < > 7.5 8.3 8.1 6.5 6.8  NEUTROABS 6.1  --   --   --   --   --    --   HGB 8.0*  < > 6.6* 8.4* 7.9* 7.9* 8.4*  HCT 26.2*  < > 22.2* 28.1* 26.3* 27.0* 28.1*  MCV 93.2  < > 94.9 94.0 94.6 95.4 95.3  PLT 344  < > 276 291 281 248 260  < > = values in this interval not displayed. Basic Metabolic Panel:  Recent Labs Lab 02/25/17 0214 02/26/17 0334 02/27/17 0447 02/28/17 0410 03/01/17 0400 03/02/17 0431  NA 140 143 141 142 138 140  K 3.7 3.2* 3.5 3.3* 3.8 3.8  CL 101 102 101 102 101 99*  CO2 30 30 31 31  32 33*  GLUCOSE 100* 81 103* 87 89 95  BUN 15 15 11 10 9 10   CREATININE 0.85 1.09 0.99 0.96 0.87 0.84  CALCIUM 8.4* 8.5* 8.3* 8.4* 8.5* 8.7*  MG 1.9  --   --   --   --   --  GFR: Estimated Creatinine Clearance: 109.3 mL/min (by C-G formula based on SCr of 0.84 mg/dL). Liver Function Tests:  Recent Labs Lab 02/24/17 1421 02/25/17 0214 02/26/17 0334 02/27/17 0447 02/28/17 0410  AST 24 17 14* 17 15  ALT 19 15* 12* 12* 11*  ALKPHOS 165* 141* 132* 139* 126  BILITOT 0.9 0.8 1.3* 1.2 1.0  PROT 6.1* 5.5* 5.1* 5.6* 5.4*  ALBUMIN 2.0* 1.8* 1.7* 1.7* 1.6*   No results for input(s): LIPASE, AMYLASE in the last 168 hours.  Recent Labs Lab 02/24/17 1421 02/27/17 1447  AMMONIA 19 51*   Coagulation Profile:  Recent Labs Lab 02/24/17 2109 02/26/17 0334  INR 1.71 1.70   Cardiac Enzymes: No results for input(s): CKTOTAL, CKMB, CKMBINDEX, TROPONINI in the last 168 hours. BNP (last 3 results) No results for input(s): PROBNP in the last 8760 hours. HbA1C: No results for input(s): HGBA1C in the last 72 hours. CBG: No results for input(s): GLUCAP in the last 168 hours. Lipid Profile: No results for input(s): CHOL, HDL, LDLCALC, TRIG, CHOLHDL, LDLDIRECT in the last 72 hours. Thyroid Function Tests: No results for input(s): TSH, T4TOTAL, FREET4, T3FREE, THYROIDAB in the last 72 hours. Anemia Panel: No results for input(s): VITAMINB12, FOLATE, FERRITIN, TIBC, IRON, RETICCTPCT in the last 72 hours. Urine analysis:    Component Value  Date/Time   COLORURINE YELLOW 02/24/2017 1604   APPEARANCEUR CLOUDY (A) 02/24/2017 1604   LABSPEC 1.020 02/24/2017 1604   PHURINE 5.5 02/24/2017 1604   GLUCOSEU NEGATIVE 02/24/2017 1604   HGBUR LARGE (A) 02/24/2017 1604   BILIRUBINUR NEGATIVE 02/24/2017 1604   KETONESUR NEGATIVE 02/24/2017 1604   PROTEINUR 30 (A) 02/24/2017 1604   UROBILINOGEN 4.0 (H) 06/02/2012 1200   NITRITE POSITIVE (A) 02/24/2017 1604   LEUKOCYTESUR LARGE (A) 02/24/2017 1604     Robbert Langlinais M.D. [facility] 03/02/2017, 12:07 PM  Pager: [phone] Between 7am to 7pm - call Pager - [phone]  After 7pm go to [email or web address] - password [email or web address]  Call night coverage person covering after 7pm

## 2017-03-03 LAB — AMMONIA: Ammonia: 27 umol/L (ref 9–35)

## 2017-03-03 LAB — BLOOD GAS, ARTERIAL
ACID-BASE EXCESS: 6.9 mmol/L — AB (ref 0.0–2.0)
Acid-Base Excess: 7.7 mmol/L — ABNORMAL HIGH (ref 0.0–2.0)
BICARBONATE: 35.3 mmol/L — AB (ref 20.0–28.0)
BICARBONATE: 35.1 mmol/L — AB (ref 20.0–28.0)
Delivery systems: POSITIVE
Drawn by: 244801
Drawn by: 313061
Expiratory PAP: 8
FIO2: 50
INSPIRATORY PAP: 16
Mode: POSITIVE
O2 CONTENT: 2 L/min
O2 SAT: 94.1 %
O2 SAT: 98.4 %
PATIENT TEMPERATURE: 98.6
PATIENT TEMPERATURE: 98.6
PCO2 ART: 87.8 mmHg — AB (ref 32.0–48.0)
PO2 ART: 84.6 mmHg (ref 83.0–108.0)
PO2 ART: 131 mmHg — AB (ref 83.0–108.0)
pCO2 arterial: 102 mmHg (ref 32.0–48.0)
pH, Arterial: 7.166 — CL (ref 7.350–7.450)
pH, Arterial: 7.225 — ABNORMAL LOW (ref 7.350–7.450)

## 2017-03-03 MED ORDER — VALPROATE SODIUM 500 MG/5ML IV SOLN
250.0000 mg | Freq: Once | INTRAVENOUS | Status: AC
  Administered 2017-03-03: 250 mg via INTRAVENOUS
  Filled 2017-03-03: qty 2.5

## 2017-03-03 MED ORDER — HYDROCODONE-ACETAMINOPHEN 5-325 MG PO TABS
1.0000 | ORAL_TABLET | Freq: Four times a day (QID) | ORAL | Status: DC | PRN
  Administered 2017-03-05 – 2017-03-09 (×4): 1 via ORAL
  Filled 2017-03-03 (×5): qty 1

## 2017-03-03 MED ORDER — FENTANYL CITRATE (PF) 100 MCG/2ML IJ SOLN
12.5000 ug | INTRAMUSCULAR | Status: DC | PRN
  Administered 2017-03-04 – 2017-03-06 (×3): 12.5 ug via INTRAVENOUS
  Filled 2017-03-03 (×3): qty 2

## 2017-03-03 MED ORDER — NALOXONE HCL 0.4 MG/ML IJ SOLN
0.2000 mg | Freq: Once | INTRAMUSCULAR | Status: AC
  Administered 2017-03-03: 0.2 mg via INTRAVENOUS
  Filled 2017-03-03: qty 1

## 2017-03-03 MED ORDER — PANTOPRAZOLE SODIUM 40 MG IV SOLR
40.0000 mg | Freq: Once | INTRAVENOUS | Status: AC
  Administered 2017-03-03: 40 mg via INTRAVENOUS
  Filled 2017-03-03: qty 40

## 2017-03-03 NOTE — Progress Notes (Signed)
Patient with critical ABG result; MD informed and per order patient will start BiPAP and will be transferred to stepdown. Will continue to monitor.

## 2017-03-03 NOTE — Progress Notes (Signed)
MD informed about patient continue to be lethargic, unable to eat or drink, unable to answer questions. New orders placed. Will continue to monitor.

## 2017-03-03 NOTE — Progress Notes (Addendum)
[facility]                                                                              Patient Demographics  Jesus [name], is a 71 y.o. male, DOB - 1946-06-28, WUJ:[id number]  Admit date - 02/24/2017   Admitting Physician Michael Litter, MD  Outpatient Primary MD for the patient is [name],[name], [name]  Outpatient specialists:   LOS - 7  days    Chief Complaint  Patient presents with  . Altered Mental Status       Brief summary   70 y.o.male with a history of chronic atrial fibrillation (anticoagulated with Eliquis; CHADS-Vasc score of 4), CKD 3, chronic diastolic heart failure, HTN, chronic anemia, chronic pain, chronic venous insufficiency, chronic edema, cirrhosis of the liver by CT, incidental lesions on his liver and pancreas seen on prior CT imaging. He was recently hospitalized from 1/22-2/16 for aspiration pneumonia and infected sacral decubitus ulcer requiring surgical debridement. The wound ultimately required a wound vac and he was referred to [facility]. According to his daughter, he did well there, but has declined quickly since being back at [facility] for less than one week. Patient found to have altered mental status and probable sepsis.   Assessment & Plan   Acute hypoxic and hypercarbic respiratory failure / Acute pulmonary edema - CXR on admission showed CHF with pulmonary interstitial edema and small right pleural effusion - Initially on BiPAP - Stable respiratory status  - He was evaluated by SLP - recommendation was for regular diet  - Continue oxygen support via [place] to keep O2 sats above 90% ADDENDUM 12:55PM ABG ph 7.166, pCO2 102, pO2 84.6 - will place on BIPAP, DNR status - transfer to SDU - discussed with patient's daughter, Laury Deep. May have underlying OSA or obesity hypoventilation.   Sepsissecondary to catheter associated Providenicia stuartii UTI as well as cellulitis and superimposed infection involving chronic nonhealing sacral  decubitus ulcer, both present on admission. - Urine cx grew Providencia, Blood cx so far showed no growth - Pt was on vanco and zosyn but this was narrowed down to Rocephin 3/22  Acute metabolic encephalopathy -Lethargic, worse this morning, had just received Norco earlier before my encounter -Received Narcan with no significant response, obtain ABG, ammonia, CT head - If no improvement, will obtain EEG and will discuss with neurology - Decreased to pain medications  Mood disorder - Continue Depakote   Chronic atrial fibrillation / Coagulopathy  - CHADS vasc score 4 - Continue anticoagulation with apixaban - Rate controlled without beta blockers  Anemia of chronic disease - Due to bone marrow suppression from history of liver cirrhosis - Has had 2 units PRBC transfusion on 3/22 - Hemoglobin stable, transfuse for hemoglobin less than 7.5  Diarrhea - C. difficile negative - No diarrhea in past   Hypokalemia - Due to sepsis, diarrhea, Currently stable  Chronic diastolic heart failure - Compensated - 2 D ECHO in 12/2014 showed EF 55-60%  Liver cirrhosis - Continue lactulose  - Continue protonix   Multiple wounds, sacral wounds, stage 4 sacral decubitus ulcer - Stage IV sacrum, partial thickness buttocks, bilateral pretibial and  calves, left lat foot, left inner thigh. Unstageable to bilateral heels, and lateral plantar surface of R foot, DTI to L medial heel - Sacrum 15cm x 18cm x 6cm deep stage 4 - Buttocks with multiple various size partial thickness wounds - Unstageable on Lateral plantar surface of R foot 1.5cm x 2cm x 0cm  - Unstageable R heel 2.5cm x 6cm x 0cm - R lateral calf 6cm x 3cm x 0.1cm partial thickness 5 - Proximal to right ankle 2.5cm x 0.5cm x 0cm dark purple.  - Two intact blisters to inner left thigh 6cm x 1.5cm and 3cm x 1cm - Left lateral calf 6cm x 3cm x 0.1cm partial thickness and one 3cm  - Left medial heel 2.5cm x 2.5cm  - Left lateral  foot 4cm x 4cm and a 2cm x 4cm  - Left posterior calf with 4cm x 3cm x 0cm  - Dressing procedure/placement/frequency: NS moistened W to D dressing BID for sacral ulcer. Xeroform and foam to wounds on heels, foam to intact blisters, xeroform and gauze to partial thickness on BLE calves. Pt is already on a specialty bed. Pt already has pressure alleviating boots on.   Code Status: DNR  DVT Prophylaxis:  apixaban Family Communication: Discussed in detail with the patient, all imaging results, lab results explained to the patient   Disposition Plan: *will need SNF  Time Spent in minutes   25  minutes  Procedures:    Consultants:   Palliative care  Antimicrobials:   Rocephin   Vanco and zosyn 3/20 --> 3/22    Medications  Scheduled Meds: . sodium chloride   Intravenous Once  . apixaban  5 mg Oral BID  . cefTRIAXone (ROCEPHIN)  IV  1 g Intravenous Q24H  . divalproex  250 mg Oral Q12H  . feeding supplement (ENSURE ENLIVE)  237 mL Oral BID BM  . feeding supplement (PRO-STAT SUGAR FREE 64)  30 mL Oral BID  . lactulose  20 g Oral BID  . mouth rinse  15 mL Mouth Rinse BID  . memantine  5 mg Oral BID  . pantoprazole  40 mg Oral QHS  . sodium chloride flush  10-40 mL Intracatheter Q12H  . sodium chloride flush  3 mL Intravenous Q12H   Continuous Infusions: PRN Meds:.acetaminophen **OR** acetaminophen, fentaNYL (SUBLIMAZE) injection, HYDROcodone-acetaminophen, hydrocortisone cream, ondansetron **OR** ondansetron (ZOFRAN) IV, sodium chloride flush   Antibiotics   Anti-infectives    Start     Dose/Rate Route Frequency Ordered Stop   02/27/17 1400  cefTRIAXone (ROCEPHIN) 1 g in dextrose 5 % 50 mL IVPB     1 g 100 mL/hr over 30 Minutes Intravenous Every 24 hours 02/27/17 1245     02/26/17 2000  vancomycin (VANCOCIN) IVPB 1000 mg/200 mL premix  Status:  Discontinued     1,000 mg 200 mL/hr over 60 Minutes Intravenous Every 24 hours 02/26/17 0809 02/27/17 1245   02/25/17 0800   vancomycin (VANCOCIN) IVPB 1000 mg/200 mL premix  Status:  Discontinued     1,000 mg 200 mL/hr over 60 Minutes Intravenous Every 12 hours 02/24/17 2102 02/26/17 0809   02/25/17 0600  piperacillin-tazobactam (ZOSYN) IVPB 3.375 g  Status:  Discontinued     3.375 g 12.5 mL/hr over 240 Minutes Intravenous Every 8 hours 02/24/17 2102 02/27/17 1245   02/24/17 1930  vancomycin (VANCOCIN) 2,000 mg in sodium chloride 0.9 % 500 mL IVPB     2,000 mg 250 mL/hr over 120 Minutes Intravenous  Once 02/24/17  1915 02/24/17 2201   02/24/17 1915  piperacillin-tazobactam (ZOSYN) IVPB 3.375 g     3.375 g 100 mL/hr over 30 Minutes Intravenous  Once 02/24/17 1907 02/24/17 2359   02/24/17 1915  vancomycin (VANCOCIN) IVPB 1000 mg/200 mL premix  Status:  Discontinued     1,000 mg 200 mL/hr over 60 Minutes Intravenous  Once 02/24/17 1907 02/24/17 1915   02/24/17 1815  cefTRIAXone (ROCEPHIN) injection 1 g  Status:  Discontinued     1 g Intramuscular  Once 02/24/17 1801 02/24/17 1907        Subjective:   Riley LamDouglas Foskett was seen and examined today.  Lethargic, difficult to arouse, opens eyes however falls back asleep difficult to obtain review of systems from the patient. Per nursing staff he had just received Norco at 6:30 in the morning for pain.  Objective:   Vitals:   03/02/17 2105 03/03/17 0557 03/03/17 0746 03/03/17 0859  BP:  (!) 170/98 (!) 140/58 124/66  Pulse:  81 75 75  Resp:  (!) 22 20 20   Temp:  97.5 F (36.4 C)    TempSrc:  Oral    SpO2: 94% 92% 93% 92%  Weight:      Height:        Intake/Output Summary (Last 24 hours) at 03/03/17 1223 Last data filed at 03/03/17 0657  Gross per 24 hour  Intake              948 ml  Output              800 ml  Net              148 ml     Wt Readings from Last 3 Encounters:  02/24/17 126.6 kg (279 lb)  02/17/17 136.1 kg (300 lb)  02/16/17 (!) 136.2 kg (300 lb 3.2 oz)     Exam  General:Lethargic opens eyes to his name but falls asleep  soon  HEENT:   Neck: Supple, no JVD, no masses  Cardiovascular: S1 S2 auscultated, no rubs, murmurs or gallops. Regular rate and rhythm.  Respiratory: Clear to auscultation bilaterally, no wheezing, rales or rhonchi  Gastrointestinal: Soft, nontender, nondistended, + bowel sounds  Ext: no cyanosis clubbing or edema  Neuro: does not follow commands  Skin: Pressure boots on bilaterally on the heels  Psych: confused, lethargic, not following commands   Data Reviewed:  I have personally reviewed following labs and imaging studies  Micro Results Recent Results (from the past 240 hour(s))  Culture, blood (routine x 2)     Status: None   Collection Time: 02/24/17  2:21 PM  Result Value Ref Range Status   Specimen Description BLOOD RIGHT UPPER  Final   Special Requests BOTTLES DRAWN AEROBIC ONLY 5CC  Final   Culture NO GROWTH 5 DAYS  Final   Report Status 03/01/2017 FINAL  Final  Culture, blood (routine x 2)     Status: None   Collection Time: 02/24/17  3:48 PM  Result Value Ref Range Status   Specimen Description BLOOD LEFT ANTECUBITAL  Final   Special Requests BOTTLES DRAWN AEROBIC ONLY 10CC  Final   Culture NO GROWTH 5 DAYS  Final   Report Status 03/01/2017 FINAL  Final  Urine culture     Status: Abnormal   Collection Time: 02/24/17  4:01 PM  Result Value Ref Range Status   Specimen Description URINE, CATHETERIZED  Final   Special Requests NONE  Final   Culture >=100,000 COLONIES/mL PROVIDENCIA  STUARTII (A)  Final   Report Status 02/26/2017 FINAL  Final   Organism ID, Bacteria PROVIDENCIA STUARTII (A)  Final      Susceptibility   Providencia stuartii - MIC*    AMPICILLIN >=32 RESISTANT Resistant     CEFAZOLIN >=64 RESISTANT Resistant     CEFTRIAXONE <=1 SENSITIVE Sensitive     CIPROFLOXACIN 2 INTERMEDIATE Intermediate     GENTAMICIN RESISTANT Resistant     IMIPENEM 2 SENSITIVE Sensitive     NITROFURANTOIN 128 RESISTANT Resistant     TRIMETH/SULFA >=320 RESISTANT  Resistant     AMPICILLIN/SULBACTAM >=32 RESISTANT Resistant     PIP/TAZO <=4 SENSITIVE Sensitive     * >=100,000 COLONIES/mL PROVIDENCIA STUARTII  MRSA PCR Screening     Status: None   Collection Time: 02/24/17  9:34 PM  Result Value Ref Range Status   MRSA by PCR NEGATIVE NEGATIVE Final    Comment:        The GeneXpert MRSA Assay (FDA approved for NASAL specimens only), is one component of a comprehensive MRSA colonization surveillance program. It is not intended to diagnose MRSA infection nor to guide or monitor treatment for MRSA infections.   C difficile quick scan w PCR reflex     Status: None   Collection Time: 02/25/17  9:53 AM  Result Value Ref Range Status   C Diff antigen NEGATIVE NEGATIVE Final   C Diff toxin NEGATIVE NEGATIVE Final   C Diff interpretation No C. difficile detected.  Final    Radiology Reports Dg Chest Port 1 View  Result Date: 02/26/2017 CLINICAL DATA:  PICC line. EXAM: PORTABLE CHEST 1 VIEW COMPARISON:  02/24/2017 . FINDINGS: PICC line noted with tip over the cavoatrial junction. Cardiomegaly with pulmonary interstitial prominence. Small right pleural effusion. No pneumothorax . IMPRESSION: 1. PICC line noted with tip projected over the cavoatrial junction. 2. Congestive heart failure with pulmonary interstitial edema and small right pleural effusion . Electronically Signed   By: Maisie Fus  Register   On: 02/26/2017 11:37   Dg Chest Port 1 View  Result Date: 02/24/2017 CLINICAL DATA:  Altered mental status EXAM: PORTABLE CHEST 1 VIEW COMPARISON:  January 20, 2017 FINDINGS: There is persistent cardiomegaly. There is pulmonary venous hypertension. There is no edema or consolidation. No adenopathy. No bone lesions. IMPRESSION: Pulmonary vascular congestion without edema or consolidation. Electronically Signed   By: Bretta Bang III M.D.   On: 02/24/2017 15:08    Lab Data:  CBC:  Recent Labs Lab 02/24/17 1421  02/26/17 0334 02/27/17 0447  02/28/17 0410 03/01/17 0400 03/02/17 0431  WBC 8.4  < > 7.5 8.3 8.1 6.5 6.8  NEUTROABS 6.1  --   --   --   --   --   --   HGB 8.0*  < > 6.6* 8.4* 7.9* 7.9* 8.4*  HCT 26.2*  < > 22.2* 28.1* 26.3* 27.0* 28.1*  MCV 93.2  < > 94.9 94.0 94.6 95.4 95.3  PLT 344  < > 276 291 281 248 260  < > = values in this interval not displayed. Basic Metabolic Panel:  Recent Labs Lab 02/25/17 0214 02/26/17 0334 02/27/17 0447 02/28/17 0410 03/01/17 0400 03/02/17 0431  NA 140 143 141 142 138 140  K 3.7 3.2* 3.5 3.3* 3.8 3.8  CL 101 102 101 102 101 99*  CO2 30 30 31 31  32 33*  GLUCOSE 100* 81 103* 87 89 95  BUN 15 15 11 10 9 10   CREATININE 0.85 1.09  0.99 0.96 0.87 0.84  CALCIUM 8.4* 8.5* 8.3* 8.4* 8.5* 8.7*  MG 1.9  --   --   --   --   --    GFR: Estimated Creatinine Clearance: 109.3 mL/min (by C-G formula based on SCr of 0.84 mg/dL). Liver Function Tests:  Recent Labs Lab 02/24/17 1421 02/25/17 0214 02/26/17 0334 02/27/17 0447 02/28/17 0410  AST 24 17 14* 17 15  ALT 19 15* 12* 12* 11*  ALKPHOS 165* 141* 132* 139* 126  BILITOT 0.9 0.8 1.3* 1.2 1.0  PROT 6.1* 5.5* 5.1* 5.6* 5.4*  ALBUMIN 2.0* 1.8* 1.7* 1.7* 1.6*   No results for input(s): LIPASE, AMYLASE in the last 168 hours.  Recent Labs Lab 02/24/17 1421 02/27/17 1447  AMMONIA 19 51*   Coagulation Profile:  Recent Labs Lab 02/24/17 2109 02/26/17 0334  INR 1.71 1.70   Cardiac Enzymes: No results for input(s): CKTOTAL, CKMB, CKMBINDEX, TROPONINI in the last 168 hours. BNP (last 3 results) No results for input(s): PROBNP in the last 8760 hours. HbA1C: No results for input(s): HGBA1C in the last 72 hours. CBG:  Recent Labs Lab 03/02/17 1649  GLUCAP 129*   Lipid Profile: No results for input(s): CHOL, HDL, LDLCALC, TRIG, CHOLHDL, LDLDIRECT in the last 72 hours. Thyroid Function Tests: No results for input(s): TSH, T4TOTAL, FREET4, T3FREE, THYROIDAB in the last 72 hours. Anemia Panel: No results for input(s):  VITAMINB12, FOLATE, FERRITIN, TIBC, IRON, RETICCTPCT in the last 72 hours. Urine analysis:    Component Value Date/Time   COLORURINE YELLOW 02/24/2017 1604   APPEARANCEUR CLOUDY (A) 02/24/2017 1604   LABSPEC 1.020 02/24/2017 1604   PHURINE 5.5 02/24/2017 1604   GLUCOSEU NEGATIVE 02/24/2017 1604   HGBUR LARGE (A) 02/24/2017 1604   BILIRUBINUR NEGATIVE 02/24/2017 1604   KETONESUR NEGATIVE 02/24/2017 1604   PROTEINUR 30 (A) 02/24/2017 1604   UROBILINOGEN 4.0 (H) 06/02/2012 1200   NITRITE POSITIVE (A) 02/24/2017 1604   LEUKOCYTESUR LARGE (A) 02/24/2017 1604     RAI,RIPUDEEP M.D. [facility] 03/03/2017, 12:23 PM  Pager: [phone] Between 7am to 7pm - call Pager - [phone]  After 7pm go to [email or web address] - password [email or web address]  Call night coverage person covering after 7pm

## 2017-03-03 NOTE — Progress Notes (Addendum)
Daily Progress Note   Patient Name: Jesus [name]       Date: 03/09/17 DOB: 11/19/1946  Age: 71 y.o. MRN#: [id number] Attending Physician: Cathren Harsh, MD Primary Care Physician: Miki Kins Admit Date: 02/24/2017  Reason for Consultation/Follow-up: Establishing goals of care with complicated wounds that are unlikely to heal and multiple admission. Declining health and family struggling. Palliative asked to help with GOC.   Subjective: Jesus [name] is alert today but remains confused. When I came in his room he had his BiPAP mask off and disconnected from BiPAP and it was alarming.   Length of Stay: 7  Current Medications: Scheduled Meds:  . sodium chloride   Intravenous Once  . apixaban  5 mg Oral BID  . cefTRIAXone (ROCEPHIN)  IV  1 g Intravenous Q24H  . divalproex  250 mg Oral Q12H  . feeding supplement (ENSURE ENLIVE)  237 mL Oral BID BM  . feeding supplement (PRO-STAT SUGAR FREE 64)  30 mL Oral BID  . lactulose  20 g Oral BID  . mouth rinse  15 mL Mouth Rinse BID  . memantine  5 mg Oral BID  . pantoprazole  40 mg Oral QHS  . sodium chloride flush  10-40 mL Intracatheter Q12H  . sodium chloride flush  3 mL Intravenous Q12H    Continuous Infusions:   PRN Meds: acetaminophen **OR** acetaminophen, fentaNYL (SUBLIMAZE) injection, HYDROcodone-acetaminophen, hydrocortisone cream, ondansetron **OR** ondansetron (ZOFRAN) IV, sodium chloride flush  Physical Exam  Constitutional: He appears well-developed.  HENT:  Head: Normocephalic and atraumatic.  Cardiovascular: Normal rate.  An irregularly irregular rhythm present.  Pulmonary/Chest: Effort normal. No accessory muscle usage. No tachypnea. No respiratory distress.  Abdominal: Normal appearance. He exhibits  distension.  Neurological: He is alert. He is disoriented.  Nursing note and vitals reviewed.           Vital Signs: BP 124/66 (BP Location: Left Arm)   Pulse 75   Temp 97.5 F (36.4 C) (Oral)   Resp 20   Ht 5\' 10"  (1.778 m)   Wt 126.6 kg (279 lb)   SpO2 92%   BMI 40.03 kg/m  SpO2: SpO2: 92 % O2 Device: O2 Device: Nasal Cannula O2 Flow Rate: O2 Flow Rate (L/min): 2 L/min  Intake/output summary:   Intake/Output Summary (Last 24 hours)  at 03/03/17 1300 Last data filed at 03/03/17 [id number]  Gross per 24 hour  Intake              948 ml  Output              800 ml  Net              148 ml   LBM: Last BM Date: 03/01/17 Baseline Weight: Weight: 126.6 kg (279 lb) Most recent weight: Weight: 126.6 kg (279 lb)       Palliative Assessment/Data:    Flowsheet Rows     Most Recent Value  Intake Tab  Referral Department  Hospitalist  Unit at Time of Referral  Intermediate Care Unit  Palliative Care Primary Diagnosis  Cardiac  Date Notified  02/25/17  Palliative Care Type  Return patient Palliative Care  Reason for referral  Clarify Goals of Care  Date of Admission  02/24/17  # of days IP prior to Palliative referral  1  Clinical Assessment  Psychosocial & Spiritual Assessment  Palliative Care Outcomes      Patient Active Problem List   Diagnosis Date Noted  . Acute respiratory failure with hypoxia and hypercapnia (HCC) 02/28/2017  . Acute pulmonary edema (HCC) 02/28/2017  . Sepsis secondary to UTI (HCC) 02/28/2017  . Goals of care, counseling/discussion   . Palliative care encounter   . Acute encephalopathy 02/24/2017  . Palliative care by specialist   . Decubitus ulcer of sacral region, unstageable (HCC) 12/30/2016  . Hyponatremia 07/15/2015  . Hypokalemia   . Chronic diastolic CHF (congestive heart failure) (HCC) 04/02/2015  . Pulmonary vascular congestion   . Peripheral edema   . 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 Renal artery  stenosis (HCC) 02/17/2014  . Peripheral vascular disease (HCC) 04/15/2013  . Morbid obesity (HCC) 06/11/2012  . Unspecified essential hypertension 05/18/2012  . Atrial fibrillation (HCC) 04/27/2012    Palliative Care Assessment & Plan   HPI: 71 y.o. male  with past medical history of multiple complicated nonhealing decubitus ulcers with recent admission requiring surgical debridement complicated by aspiration pneumonica, Atrial fibrillation on Eliquis, diastolic heart failure, HTN, chronic anemia, chronic venous insufficiency, chronic edema, liver cirrhosis per CT, lesions in liver and pancreas per CT (thought to be cyst vs pseudocyst) admitted on 02/24/2017 with hypoxia followed by AMS and hypercarbia. [name] Family says he did very well in [facility] and wounds were controlled well but very unhappy as they feel his decline has been just since he has been back in SNF.   Assessment: No family at bedside. Unclear how long he will tolerate BiPAP - RN reports maybe a couple hours at a time at night. I assisted to reposition him.  I called daughter/HCPOA, [name], but no answer. I left a voicemail letting her know that I was just checking in and following up on our conversation Friday where she had plan to talk more with her family about how aggressive to be. I will await for her call at this time as I do not want to make them feel like I am pushing them to allow for palliative assistance in the future as needed. I will not follow up unless contacted by family at this time (or if his condition acutely worsens). Will shadow chart.   Recommendations/Plan:  Chronic pain r/t wounds: Consider tramadol prn.   Please reconsult palliative care on future  admissions.   Goals of Care and Additional Recommendations:  Limitations on Scope of Treatment: Full Scope Treatment  Code Status:  DNR  Prognosis:   Unable to  determine but likely poor with multiple comorbitities and poor chance of improving  Discharge Planning:  Return to SNF   Thank you for allowing the Palliative Medicine Team to assist in the care of this patient.   Total Time Prolonged Time Billed  no       Greater than 50%  of this time was spent counseling and coordinating care related to the above assessment and plan.  Yong Channel, NP Palliative Medicine Team Pager # [phone] (M-F 8a-5p) Team Phone # [phone] (Nights/Weekends)

## 2017-03-03 NOTE — Progress Notes (Signed)
Patient was transferred to 4E by MD order.

## 2017-03-03 NOTE — Progress Notes (Addendum)
Attempted report 

## 2017-03-03 NOTE — Progress Notes (Signed)
CRITICAL VALUE ALERT  Critical value received:  Ph=7.16; CO2=102  Date of notification:  03/27  Time of notification:  12:51  Critical value read back:Yes.    Nurse who received alert:  Lurena NidaGreta Shariece Viveiros  MD notified (1st page):  Dr. Isidoro Donningai  Time of first page:  12:51  MD notified (2nd page):  Time of second page:  Responding MD:  Dr. Isidoro Donningai  Time MD responded:  12;52

## 2017-03-03 NOTE — Progress Notes (Signed)
Pt on BiPAP awaiting SD bed.  RT at bedside until pt transferred.

## 2017-03-03 NOTE — Progress Notes (Signed)
Changed dressing to buttock per wound care order. Patient confused and resistant to wound care, premed given for pain prior to dressing change.

## 2017-03-03 NOTE — Progress Notes (Signed)
Patient very lethargic and hard to be aroused. Per MD order, he received 0.2 mg Narcan IV. After he received Narcan he continued to be lethargic, unable to stay awake. MD recommended another dose of Narcan 0.2mg  IV. VS stable. Will continue to monitor.

## 2017-03-03 NOTE — Discharge Instructions (Signed)

## 2017-03-03 NOTE — Consult Note (Signed)
   [name]   03/03/2017  [name] Mar 01, 1946 [id number]   [facility].  Patient was recently at a skilled facility for rehab and nursing care.  Chart reviewed for H&P notes that Jesus [name] is a 71 y.o. gentleman with a history of chronic atrial fibrillation (anticoagulated with Eliquis; CHADS-Vasc score of 4), CKD 3, chronic diastolic heart failure, HTN, chronic anemia, chronic pain, chronic venous insufficiency, chronic edema, cirrhosis of the liver by CT, incidental lesions in his liver and pancreas seen on prior CT imaging, and recent admission from 1/22-2/16 for infected sacral decubitus ulcer requiring surgical debridement and aspiration pneumonia.  The wound ultimately required a wound vac and [facility]  According to his daughter, he did well there, but has declined quickly [facility]. Also, notes that Palliative Care is following for pain management.  Went by and patient is likely to transfer off med surg unit.   Jesus ShanksVictoria Violetta Lavalle, RN BSN CCM [facility] Lia[facility]  [phone] business mobile phone Toll free office [phone]

## 2017-03-04 LAB — BLOOD GAS, ARTERIAL
ACID-BASE EXCESS: 8 mmol/L — AB (ref 0.0–2.0)
Acid-Base Excess: 8.9 mmol/L — ABNORMAL HIGH (ref 0.0–2.0)
BICARBONATE: 33.4 mmol/L — AB (ref 20.0–28.0)
BICARBONATE: 32.2 mmol/L — AB (ref 20.0–28.0)
DELIVERY SYSTEMS: POSITIVE
DRAWN BY: 398981
EXPIRATORY PAP: 8
FIO2: 50
FIO2: 21
Inspiratory PAP: 16
MODE: POSITIVE
O2 SAT: 99.7 %
O2 SAT: 94 %
PCO2 ART: 58.5 mmHg — AB (ref 32.0–48.0)
PH ART: 7.37 (ref 7.350–7.450)
PO2 ART: 63.6 mmHg — AB (ref 83.0–108.0)
Patient temperature: 97.2
Patient temperature: 98.6
pCO2 arterial: 38.3 mmHg (ref 32.0–48.0)
pH, Arterial: 7.535 — ABNORMAL HIGH (ref 7.350–7.450)
pO2, Arterial: 185 mmHg — ABNORMAL HIGH (ref 83.0–108.0)

## 2017-03-04 LAB — BASIC METABOLIC PANEL
Anion gap: 7 (ref 5–15)
BUN: 10 mg/dL (ref 6–20)
CHLORIDE: 101 mmol/L (ref 101–111)
CO2: 35 mmol/L — AB (ref 22–32)
CREATININE: 0.81 mg/dL (ref 0.61–1.24)
Calcium: 9.2 mg/dL (ref 8.9–10.3)
GFR calc Af Amer: >60 mL/min (ref >60)
Glucose, Bld: 82 mg/dL (ref 65–99)
Potassium: 4.6 mmol/L (ref 3.5–5.1)
Sodium: 143 mmol/L (ref 135–145)

## 2017-03-04 LAB — CBC
HEMATOCRIT: 29 % — AB (ref 39.0–52.0)
HEMOGLOBIN: 8.5 g/dL — AB (ref 13.0–17.0)
MCH: 29.1 pg (ref 26.0–34.0)
MCHC: 29.3 g/dL — ABNORMAL LOW (ref 30.0–36.0)
MCV: 99.3 fL (ref 78.0–100.0)
Platelets: 271 10*3/uL (ref 150–400)
RBC: 2.92 MIL/uL — ABNORMAL LOW (ref 4.22–5.81)
RDW: 17.2 % — ABNORMAL HIGH (ref 11.5–15.5)
WBC: 8.8 10*3/uL (ref 4.0–10.5)

## 2017-03-04 LAB — URINALYSIS, ROUTINE W REFLEX MICROSCOPIC
BILIRUBIN URINE: NEGATIVE
Glucose, UA: NEGATIVE mg/dL
KETONES UR: 20 mg/dL — AB
Nitrite: NEGATIVE
PH: 6 (ref 5.0–8.0)
Protein, ur: 100 mg/dL — AB
Specific Gravity, Urine: 1.018 (ref 1.005–1.030)

## 2017-03-04 MED ORDER — CHLORHEXIDINE GLUCONATE 0.12 % MT SOLN
15.0000 mL | Freq: Two times a day (BID) | OROMUCOSAL | Status: DC
  Administered 2017-03-04 – 2017-03-17 (×26): 15 mL via OROMUCOSAL
  Filled 2017-03-04 (×26): qty 15

## 2017-03-04 MED ORDER — HALOPERIDOL LACTATE 5 MG/ML IJ SOLN
1.0000 mg | Freq: Four times a day (QID) | INTRAMUSCULAR | Status: DC | PRN
  Administered 2017-03-04 – 2017-03-15 (×6): 1 mg via INTRAVENOUS
  Filled 2017-03-04 (×6): qty 1

## 2017-03-04 NOTE — Progress Notes (Signed)
Daily Progress Note   Patient Name: Jesus [name]       Date: 03/04/2017 DOB: November 09, 1946  Age: 71 y.o. MRN#: [id number] Attending Physician: Cathren Harsh, MD Primary Care Physician: Miki Kins Admit Date: 02/24/2017  Reason for Consultation/Follow-up:  Establishing goals of carewith complicated wounds that are unlikely to heal and multiple admission. Declining health and family struggling. Palliative asked to help with GOC.    Subjective: Jesus [name] is lying in bed. Agitated and frightened. No family at bedside.   Length of Stay: 8  Current Medications: Scheduled Meds:  . sodium chloride   Intravenous Once  . apixaban  5 mg Oral BID  . cefTRIAXone (ROCEPHIN)  IV  1 g Intravenous Q24H  . chlorhexidine  15 mL Mouth Rinse BID  . divalproex  250 mg Oral Q12H  . feeding supplement (ENSURE ENLIVE)  237 mL Oral BID BM  . feeding supplement (PRO-STAT SUGAR FREE 64)  30 mL Oral BID  . lactulose  20 g Oral BID  . mouth rinse  15 mL Mouth Rinse BID  . memantine  5 mg Oral BID  . pantoprazole  40 mg Oral QHS  . sodium chloride flush  10-40 mL Intracatheter Q12H  . sodium chloride flush  3 mL Intravenous Q12H    Continuous Infusions:   PRN Meds: acetaminophen **OR** acetaminophen, fentaNYL (SUBLIMAZE) injection, haloperidol lactate, HYDROcodone-acetaminophen, hydrocortisone cream, ondansetron **OR** ondansetron (ZOFRAN) IV, sodium chloride flush  Physical Exam  Constitutional: He appears well-developed.  HENT:  Head: Normocephalic and atraumatic.  Cardiovascular: Normal rate.  An irregularly irregular rhythm present.  Pulmonary/Chest: Effort normal. No accessory muscle usage. No tachypnea. No respiratory distress.  Abdominal: Soft. Normal appearance.  Neurological: He  is alert. He is disoriented.  Psychiatric: His mood appears anxious. He is agitated. Cognition and memory are impaired. He expresses impulsivity. He exhibits a depressed mood.  Nursing note and vitals reviewed.           Vital Signs: BP (!) 160/84 (BP Location: Left Arm)   Pulse (!) 106   Temp 98.8 F (37.1 C) (Axillary)   Resp (!) 25   Ht 5\' 10"  (1.778 m)   Wt 126.6 kg (279 lb)   SpO2 93%   BMI 40.03 kg/m  SpO2: SpO2: 93 % O2 Device: O2 Device: Not  Delivered O2 Flow Rate: O2 Flow Rate (L/min): 2 L/min  Intake/output summary:  Intake/Output Summary (Last 24 hours) at 03/04/17 1312 Last data filed at 03/04/17 1156  Gross per 24 hour  Intake               10 ml  Output             1100 ml  Net            -1090 ml   LBM: Last BM Date: 03/01/17 Baseline Weight: Weight: 126.6 kg (279 lb) Most recent weight: Weight: 126.6 kg (279 lb)       Palliative Assessment/Data:    Flowsheet Rows     Most Recent Value  Intake Tab  Referral Department  Hospitalist  Unit at Time of Referral  Intermediate Care Unit  Palliative Care Primary Diagnosis  Cardiac  Date Notified  02/25/17  Palliative Care Type  Return patient Palliative Care  Reason for referral  Clarify Goals of Care  Date of Admission  02/24/17  # of days IP prior to Palliative referral  1  Clinical Assessment  Psychosocial & Spiritual Assessment  Palliative Care Outcomes      Patient Active Problem List   Diagnosis Date Noted  . Acute respiratory failure with hypoxia and hypercapnia (HCC) 02/28/2017  . Acute pulmonary edema (HCC) 02/28/2017  . Sepsis secondary to UTI (HCC) 02/28/2017  . Goals of care, counseling/discussion   . Palliative care encounter   . Acute encephalopathy 02/24/2017  . Palliative care by specialist   . Decubitus ulcer of sacral region, unstageable (HCC) 12/30/2016  . Hyponatremia 07/15/2015  . Hypokalemia   . Chronic diastolic CHF (congestive heart failure) (HCC) 04/02/2015  . Pulmonary  vascular congestion   . Peripheral edema   . 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 Renal artery stenosis (HCC) 02/17/2014  . Peripheral vascular disease (HCC) 04/15/2013  . Morbid obesity (HCC) 06/11/2012  . Unspecified essential hypertension 05/18/2012  . Atrial fibrillation (HCC) 04/27/2012    Palliative Care Assessment & Plan   HPI: 71 y.o.malewith past medical history of multiple complicated nonhealing decubitus ulcers with recent admission requiring surgical debridement complicated by aspiration pneumonica, Atrial fibrillation on Eliquis, diastolic heart failure, HTN, chronic anemia, chronic venous insufficiency, chronic edema, liver cirrhosis per CT, lesions in liver and pancreas per CT (thought to be cyst vs pseudocyst)admitted on 3/20/2018with hypoxia followed by AMS and hypercarbia. Went to [facility] from recent admission and just recently returned to SNF [facility] Family says he did very well in LTAC and wounds were controlled well but very unhappy as they feel his decline has been just since he has been back in SNF.   Assessment: Jesus [name] is lying in bed, tearful, and continuously saying "help me." He cannot tell me what he needs help with. He appears frightened and confused. Says "I'm going to die." I spent some time attempting to calm and comfort him.   His mentation and agitation continues to fluctuate. This is similar to when he was first admitted and required BiPAP. Family is struggling with his cycle of poor health and understand that there is no "fix" to this problem and cycle. However, they have not ben prepared for more comfort focused care at this time. I will reach out to family again tomorrow for support.   Recommendations/Plan:  BiPAP as needed, continue aggressive medical management  Be cautious with sedating meds (opioids/benzos) as these seem to lead to hypercarbia  Consider haldol if QTc allows. Could also consider  Zyprexa.   Would consider CPAP/BiPAP qhs but he will probably refuse (dtr/HCPOA understands and agrees)  Difficult situation as he is noncompliant but also does not understand importance of interventions  I will continue to try and support and help family  Goals of Care and Additional Recommendations:  Limitations on Scope of Treatment: Full Scope Treatment outside DNR  Code Status:  DNR  Prognosis:   Unable to determinebut likely poor with multiple comorbitities and poor chance of improving  Discharge Planning:  Skilled Nursing Facility for rehab with Palliative care service follow-up   Thank you for allowing the Palliative Medicine Team to assist in the care of this patient.   Total Time 15min Prolonged Time Billed  no       Greater than 50%  of this time was spent counseling and coordinating care related to the above assessment and plan.  Yong ChannelAlicia Aleric Froelich, NP Palliative Medicine Team Pager # [phone] (M-F 8a-5p) Team Phone # [phone] (Nights/Weekends)

## 2017-03-04 NOTE — Care Management Note (Addendum)
Case Management Note  Patient Details  Name: [name] M Hinchcliff MRN: [id number] Date of Birth: November 08, 1946  Subjective/Objective:   Patient transferred to 4E  from 5W, patient with critical ABG's, was put on bipap and has a fever.   Per RN, patient has pulled bipap off this am, he is disoriented, hallucinating for MRI and repeat ABG's, he is in afib, NPO, patient's daughter, Laury DeepDulcie Deforge is his POA.  CSW following for plan for SNF at dc.  Palliative consulted also.                    Action/Plan:   Expected Discharge Date:                  Expected Discharge Plan:  Skilled Nursing Facility  In-House Referral:  Clinical Social Work  Discharge planning Services  CM Consult  Post Acute Care Choice:    Choice offered to:     DME Arranged:    DME Agency:     HH Arranged:    HH Agency:     Status of Service:  Completed, signed off  If discussed at [employer] Meetings, dates discussed:    Additional Comments:  Leone Havenaylor, Kent Riendeau Clinton, RN 03/04/2017, 3:45 PM

## 2017-03-04 NOTE — Progress Notes (Signed)
Nutrition Follow-up  DOCUMENTATION CODES:   Morbid obesity  INTERVENTION:  Continue Ensure Enlive po BID, each supplement provides 350 kcal and 20 grams of protein  Continue Prostat liquid protein po 30 ml BID with meals, each supplement provides 100 kcal, 15 grams protein   NUTRITION DIAGNOSIS:   Increased nutrient needs related to wound healing as evidenced by estimated needs.  Ongoing  GOAL:   Patient will meet greater than or equal to 90% of their needs Unmet  MONITOR:   PO intake, Supplement acceptance, Labs, Weight trends, Skin, I & O's   ASSESSMENT:   71 yo Male with PMH of diastolic heart failure, HTN, chronic anemia, chronic pain, chronic venous insufficiency, chronic edema, cirrhosis of the liver by CT; admitted with altered mental status, probable sepsis, encephalopathy, started on broad-spectrum antibiotics.  Patients intake has not been charted since 3/23. Patients weight has not been charted since 3/20. At that time, patient has lost 21 lb over the course of week (7%).  Per RN, patient has been refusing to be turned frequently and yells out/curses at staff each time he is turned. Patient has been refusing to eat since 3/23.  Per MD, prognosis is poor, patient is DNR status at this time.  Meds Reviewed: Lactulose  Labs Reviewed.  Diet Order:  Diet regular Room service appropriate? Yes; Fluid consistency: Thin  Skin:      Stage IV sacrum Partial thickness to buttocks, bilateral pretibial and calves, L lat foot, L inner thigh Unstageable to bilateral heels and lateral plantar surface of R foot DTI to L medial heel   Last BM:  3/21  Height:   Ht Readings from Last 1 Encounters:  02/24/17 5\' 10"  (1.778 m)    Weight:   Wt Readings from Last 1 Encounters:  02/24/17 279 lb (126.6 kg)    Ideal Body Weight:  75.4 kg  BMI:  Body mass index is 40.03 kg/m.  Estimated Nutritional Needs:   Kcal:  2100-2300  Protein:  115-125 gm  Fluid:   2.1-2.3 L  EDUCATION NEEDS:   No education needs identified at this time Rex KrasGrace Ann Hussein Macdougal M.S. Nutrition Dietetic Intern

## 2017-03-04 NOTE — Progress Notes (Signed)
[facility]                                                                              Patient Demographics  Jesus [name], is a 71 y.o. male, DOB - March 01, 1946, ZOX:[id number]  Admit date - 02/24/2017   Admitting Physician Michael LitterNikki Carter, MD  Outpatient Primary MD for the patient is [name],[name], [name]  Outpatient specialists:   LOS - 8  days    Chief Complaint  Patient presents with  . Altered Mental Status       Brief summary   70 y.o.male with a history of chronic atrial fibrillation (anticoagulated with Eliquis; CHADS-Vasc score of 4), CKD 3, chronic diastolic heart failure, HTN, chronic anemia, chronic pain, chronic venous insufficiency, chronic edema, cirrhosis of the liver by CT, incidental lesions on his liver and pancreas seen on prior CT imaging. He was recently hospitalized from 1/22-2/16 for aspiration pneumonia and infected sacral decubitus ulcer requiring surgical debridement. The wound ultimately required a wound vac and [facility] According to his daughter, he did well there, but [facility]. Patient found to have altered mental status and probable sepsis.   Assessment & Plan   Acute hypoxic and hypercarbic respiratory failure / Acute pulmonary edema - CXR on admission showed CHF with pulmonary interstitial edema and small right pleural effusion - On admission, patient was placed on BiPAP, subsequently his respiratory status was improved and patient was transferred to the floor.  - He was evaluated by SLP - recommendation was for regular diet  - On 3/27, patient was noticed to be lethargic, not following commands, had received pain medication in the morning but did not respond to Narcan. ABG showed pH of 7.166 pCO2 102, pO2 84.6 - Patient was transferred back to stepdown unit and placed on BiPAP (DNR status). He is - May have underlying OSA or obesity hypoventilation, will need  outpatient sleep study to qualify for CPAP. - Patient now off the BiPAP, pH 7.3, PCO2 58. O2 sats and 90s on room air however patient is delirious and hallucinating, had taken off his BiPAP earlier this morning, will repeat ABG - Palliative medicine also reconsulted for goals of care   Acute metabolic encephalopathy -No significant improvement, currently hallucinating and delirious, repeat ABG.  - MRI of the brain to rule out any stroke given his history of atrial fibrillation however it will be difficult to obtain MRI given agitation and delirium. EEG to rule out seizures. - Ammonia level was normal, discussed with nephrology, Dr. Myrle ShengKirkpatrick  Sepsissecondary to catheter associated Providenicia stuartii UTI as well as cellulitis and superimposed infection involving chronic nonhealing sacral decubitus ulcer, both present on admission. - Urine cx grew Providencia, Blood cx so far showed no growth - Pt was on vanco and zosyn but this was narrowed down to Rocephin 3/22  Mood disorder - Continue Depakote   Chronic atrial fibrillation / Coagulopathy  - CHADS vasc score 4 - Continue anticoagulation with apixaban - Rate controlled without beta blockers  Anemia of chronic disease - Due to bone marrow suppression from  history of liver cirrhosis - Has had 2 units PRBC transfusion on 3/22 - Hemoglobin stable, transfuse for hemoglobin less than 7.5  Diarrhea - C. difficile negative - No diarrhea in past   Hypokalemia - Due to sepsis, diarrhea, Currently stable  Chronic diastolic heart failure - Compensated - 2 D ECHO in 12/2014 showed EF 55-60%  Liver cirrhosis - Continue lactulose  - Continue protonix   Multiple wounds, sacral wounds, stage 4 sacral decubitus ulcer - Stage IV sacrum, partial thickness buttocks, bilateral pretibial and calves, left lat foot, left inner thigh. Unstageable to bilateral heels, and lateral plantar surface of R foot, DTI to L medial heel -  Sacrum 15cm x 18cm x 6cm deep stage 4 - Buttocks with multiple various size partial thickness wounds - Unstageable on Lateral plantar surface of R foot 1.5cm x 2cm x 0cm  - Unstageable R heel 2.5cm x 6cm x 0cm - R lateral calf 6cm x 3cm x 0.1cm partial thickness 5 - Proximal to right ankle 2.5cm x 0.5cm x 0cm dark purple.  - Two intact blisters to inner left thigh 6cm x 1.5cm and 3cm x 1cm - Left lateral calf 6cm x 3cm x 0.1cm partial thickness and one 3cm  - Left medial heel 2.5cm x 2.5cm  - Left lateral foot 4cm x 4cm and a 2cm x 4cm  - Left posterior calf with 4cm x 3cm x 0cm  - Dressing procedure/placement/frequency: NS moistened W to D dressing BID for sacral ulcer. Xeroform and foam to wounds on heels, foam to intact blisters, xeroform and gauze to partial thickness on BLE calves. Pt is already on a specialty bed. Pt already has pressure alleviating boots on.   Code Status: DNR  DVT Prophylaxis:  apixaban Family Communication: I had talked to the patient's daughter, Laury Deep [facility]) yesterday in detail and today left a detailed voicemail message, unable to make contact.   Disposition Plan: *will need SNF.   Time Spent in minutes   25  minutes  Procedures:  BiPAP  Consultants:   Palliative care Neurology   Antimicrobials:   Rocephin   Vanco and zosyn 3/20 --> 3/22    Medications  Scheduled Meds: . sodium chloride   Intravenous Once  . apixaban  5 mg Oral BID  . cefTRIAXone (ROCEPHIN)  IV  1 g Intravenous Q24H  . chlorhexidine  15 mL Mouth Rinse BID  . divalproex  250 mg Oral Q12H  . feeding supplement (ENSURE ENLIVE)  237 mL Oral BID BM  . feeding supplement (PRO-STAT SUGAR FREE 64)  30 mL Oral BID  . lactulose  20 g Oral BID  . mouth rinse  15 mL Mouth Rinse BID  . memantine  5 mg Oral BID  . pantoprazole  40 mg Oral QHS  . sodium chloride flush  10-40 mL Intracatheter Q12H  . sodium chloride flush  3 mL Intravenous Q12H   Continuous Infusions: PRN  Meds:.acetaminophen **OR** acetaminophen, fentaNYL (SUBLIMAZE) injection, HYDROcodone-acetaminophen, hydrocortisone cream, ondansetron **OR** ondansetron (ZOFRAN) IV, sodium chloride flush   Antibiotics   Anti-infectives    Start     Dose/Rate Route Frequency Ordered Stop   02/27/17 1400  cefTRIAXone (ROCEPHIN) 1 g in dextrose 5 % 50 mL IVPB     1 g 100 mL/hr over 30 Minutes Intravenous Every 24 hours 02/27/17 1245     02/26/17 2000  vancomycin (VANCOCIN) IVPB 1000 mg/200 mL premix  Status:  Discontinued     1,000 mg 200 mL/hr  over 60 Minutes Intravenous Every 24 hours 02/26/17 0809 02/27/17 1245   02/25/17 0800  vancomycin (VANCOCIN) IVPB 1000 mg/200 mL premix  Status:  Discontinued     1,000 mg 200 mL/hr over 60 Minutes Intravenous Every 12 hours 02/24/17 2102 02/26/17 0809   02/25/17 0600  piperacillin-tazobactam (ZOSYN) IVPB 3.375 g  Status:  Discontinued     3.375 g 12.5 mL/hr over 240 Minutes Intravenous Every 8 hours 02/24/17 2102 02/27/17 1245   02/24/17 1930  vancomycin (VANCOCIN) 2,000 mg in sodium chloride 0.9 % 500 mL IVPB     2,000 mg 250 mL/hr over 120 Minutes Intravenous  Once 02/24/17 1915 02/24/17 2201   02/24/17 1915  piperacillin-tazobactam (ZOSYN) IVPB 3.375 g     3.375 g 100 mL/hr over 30 Minutes Intravenous  Once 02/24/17 1907 02/24/17 2359   02/24/17 1915  vancomycin (VANCOCIN) IVPB 1000 mg/200 mL premix  Status:  Discontinued     1,000 mg 200 mL/hr over 60 Minutes Intravenous  Once 02/24/17 1907 02/24/17 1915   02/24/17 1815  cefTRIAXone (ROCEPHIN) injection 1 g  Status:  Discontinued     1 g Intramuscular  Once 02/24/17 1801 02/24/17 1907        Subjective:   Jesus [name] was seen and examined today.  Today confused and agitated, took off his BiPAP this morning. Currently hallucinating and talking nonsense. Unable to obtain any review of system from the patient.   Objective:   Vitals:   03/04/17 0407 03/04/17 0540 03/04/17 0550 03/04/17 0731    BP: 113/69   (!) 142/87  Pulse: 86   87  Resp: 19 (!) 21 (!) 22 20  Temp: 97.2 F (36.2 C)   99.2 F (37.3 C)  TempSrc: Axillary   Axillary  SpO2: 100% 98% 92% 92%  Weight:      Height:        Intake/Output Summary (Last 24 hours) at 03/04/17 1127 Last data filed at 03/04/17 0732  Gross per 24 hour  Intake               10 ml  Output              700 ml  Net             -690 ml     Wt Readings from Last 3 Encounters:  02/24/17 126.6 kg (279 lb)  02/17/17 136.1 kg (300 lb)  02/16/17 (!) 136.2 kg (300 lb 3.2 oz)     Exam  General: Agitated, delirious and hallucinating   HEENT:   Neck: Supple, no JVD,   Cardiovascular: S1 S2 auscultated, no rubs, murmurs or gallops. Regular rate and rhythm.  Respiratory: Clear to auscultation bilaterally, no wheezing, rales or rhonchi  Gastrointestinal: Soft, nontender, nondistended, + bowel sounds  Ext: no cyanosis clubbing or edema  Neuro: does not follow commands  Skin: Pressure boots on bilaterally on the heels  Psych: confused, agitated  not following commands   Data Reviewed:  I have personally reviewed following labs and imaging studies  Micro Results Recent Results (from the past 240 hour(s))  Culture, blood (routine x 2)     Status: None   Collection Time: 02/24/17  2:21 PM  Result Value Ref Range Status   Specimen Description BLOOD RIGHT UPPER  Final   Special Requests BOTTLES DRAWN AEROBIC ONLY 5CC  Final   Culture NO GROWTH 5 DAYS  Final   Report Status 03/01/2017 FINAL  Final  Culture, blood (routine  x 2)     Status: None   Collection Time: 02/24/17  3:48 PM  Result Value Ref Range Status   Specimen Description BLOOD LEFT ANTECUBITAL  Final   Special Requests BOTTLES DRAWN AEROBIC ONLY 10CC  Final   Culture NO GROWTH 5 DAYS  Final   Report Status 03/01/2017 FINAL  Final  Urine culture     Status: Abnormal   Collection Time: 02/24/17  4:01 PM  Result Value Ref Range Status   Specimen Description  URINE, CATHETERIZED  Final   Special Requests NONE  Final   Culture >=100,000 COLONIES/mL PROVIDENCIA STUARTII (A)  Final   Report Status 02/26/2017 FINAL  Final   Organism ID, Bacteria PROVIDENCIA STUARTII (A)  Final      Susceptibility   Providencia stuartii - MIC*    AMPICILLIN >=32 RESISTANT Resistant     CEFAZOLIN >=64 RESISTANT Resistant     CEFTRIAXONE <=1 SENSITIVE Sensitive     CIPROFLOXACIN 2 INTERMEDIATE Intermediate     GENTAMICIN RESISTANT Resistant     IMIPENEM 2 SENSITIVE Sensitive     NITROFURANTOIN 128 RESISTANT Resistant     TRIMETH/SULFA >=320 RESISTANT Resistant     AMPICILLIN/SULBACTAM >=32 RESISTANT Resistant     PIP/TAZO <=4 SENSITIVE Sensitive     * >=100,000 COLONIES/mL PROVIDENCIA STUARTII  MRSA PCR Screening     Status: None   Collection Time: 02/24/17  9:34 PM  Result Value Ref Range Status   MRSA by PCR NEGATIVE NEGATIVE Final    Comment:        The GeneXpert MRSA Assay (FDA approved for NASAL specimens only), is one component of a comprehensive MRSA colonization surveillance program. It is not intended to diagnose MRSA infection nor to guide or monitor treatment for MRSA infections.   C difficile quick scan w PCR reflex     Status: None   Collection Time: 02/25/17  9:53 AM  Result Value Ref Range Status   C Diff antigen NEGATIVE NEGATIVE Final   C Diff toxin NEGATIVE NEGATIVE Final   C Diff interpretation No C. difficile detected.  Final    Radiology Reports Dg Chest Port 1 View  Result Date: 02/26/2017 CLINICAL DATA:  PICC line. EXAM: PORTABLE CHEST 1 VIEW COMPARISON:  02/24/2017 . FINDINGS: PICC line noted with tip over the cavoatrial junction. Cardiomegaly with pulmonary interstitial prominence. Small right pleural effusion. No pneumothorax . IMPRESSION: 1. PICC line noted with tip projected over the cavoatrial junction. 2. Congestive heart failure with pulmonary interstitial edema and small right pleural effusion . Electronically Signed    By: Maisie Fus  Register   On: 02/26/2017 11:37   Dg Chest Port 1 View  Result Date: 02/24/2017 CLINICAL DATA:  Altered mental status EXAM: PORTABLE CHEST 1 VIEW COMPARISON:  January 20, 2017 FINDINGS: There is persistent cardiomegaly. There is pulmonary venous hypertension. There is no edema or consolidation. No adenopathy. No bone lesions. IMPRESSION: Pulmonary vascular congestion without edema or consolidation. Electronically Signed   By: Bretta Bang III M.D.   On: 02/24/2017 15:08    Lab Data:  CBC:  Recent Labs Lab 02/27/17 0447 02/28/17 0410 03/01/17 0400 03/02/17 0431 03/04/17 0443  WBC 8.3 8.1 6.5 6.8 8.8  HGB 8.4* 7.9* 7.9* 8.4* 8.5*  HCT 28.1* 26.3* 27.0* 28.1* 29.0*  MCV 94.0 94.6 95.4 95.3 99.3  PLT 291 281 248 260 271   Basic Metabolic Panel:  Recent Labs Lab 02/27/17 0447 02/28/17 0410 03/01/17 0400 03/02/17 0431 03/04/17 0443  NA 141  142 138 140 143  K 3.5 3.3* 3.8 3.8 4.6  CL 101 102 101 99* 101  CO2 31 31 32 33* 35*  GLUCOSE 103* 87 89 95 82  BUN 11 10 9 10 10   CREATININE 0.99 0.96 0.87 0.84 0.81  CALCIUM 8.3* 8.4* 8.5* 8.7* 9.2   GFR: Estimated Creatinine Clearance: 113.3 mL/min (by C-G formula based on SCr of 0.81 mg/dL). Liver Function Tests:  Recent Labs Lab 02/26/17 0334 02/27/17 0447 02/28/17 0410  AST 14* 17 15  ALT 12* 12* 11*  ALKPHOS 132* 139* 126  BILITOT 1.3* 1.2 1.0  PROT 5.1* 5.6* 5.4*  ALBUMIN 1.7* 1.7* 1.6*   No results for input(s): LIPASE, AMYLASE in the last 168 hours.  Recent Labs Lab 02/27/17 1447 03/03/17 1800  AMMONIA 51* 27   Coagulation Profile:  Recent Labs Lab 02/26/17 0334  INR 1.70   Cardiac Enzymes: No results for input(s): CKTOTAL, CKMB, CKMBINDEX, TROPONINI in the last 168 hours. BNP (last 3 results) No results for input(s): PROBNP in the last 8760 hours. HbA1C: No results for input(s): HGBA1C in the last 72 hours. CBG:  Recent Labs Lab 03/02/17 1649  GLUCAP 129*   Lipid  Profile: No results for input(s): CHOL, HDL, LDLCALC, TRIG, CHOLHDL, LDLDIRECT in the last 72 hours. Thyroid Function Tests: No results for input(s): TSH, T4TOTAL, FREET4, T3FREE, THYROIDAB in the last 72 hours. Anemia Panel: No results for input(s): VITAMINB12, FOLATE, FERRITIN, TIBC, IRON, RETICCTPCT in the last 72 hours. Urine analysis:    Component Value Date/Time   COLORURINE YELLOW 02/24/2017 1604   APPEARANCEUR CLOUDY (A) 02/24/2017 1604   LABSPEC 1.020 02/24/2017 1604   PHURINE 5.5 02/24/2017 1604   GLUCOSEU NEGATIVE 02/24/2017 1604   HGBUR LARGE (A) 02/24/2017 1604   BILIRUBINUR NEGATIVE 02/24/2017 1604   KETONESUR NEGATIVE 02/24/2017 1604   PROTEINUR 30 (A) 02/24/2017 1604   UROBILINOGEN 4.0 (H) 06/02/2012 1200   NITRITE POSITIVE (A) 02/24/2017 1604   LEUKOCYTESUR LARGE (A) 02/24/2017 1604     Lot Medford M.D. [facility] 03/04/2017, 11:27 AM  Pager: [phone] Between 7am to 7pm - call Pager - [phone]  After 7pm go to [email or web address] - password [email or web address]  Call night coverage person covering after 7pm

## 2017-03-04 NOTE — Consult Note (Signed)
NEURO HOSPITALIST CONSULT NOTE   Requestig physician: Dr. Gershon Crane   Reason for Consult: Encephalopathy after acute hypoxia and hypercarbic respiratory failure.    History obtained from:   Chart    HPI:                                                                                                                                          Jesus [name] is an 71 y.o. male with multiple medical conditions including: Chronic atrial fibrillation on requests, hypertension, heart failure, chronic anemia, cirrhosis of the liver. She was recently hospitalized from 1/20 222/16 for aspiration pneumonia and infection of sacral decubitus. He was doing well but apparently declined quickly since being back at the SNF. Per chart on admission patient's sister was at bedside and reported the patient demonstrated intermittent periods of forgetfulness at baseline. He is bedbound at baseline. On the day of admission it was noted that his breathing was off his O2 saturation was 87% on room air. On the ED was found to have positive leukocytes and nitrites. While hospitalized patient had no significant improvement with his metabolic encephalopathy including hallucinations and delirium. EEG has been obtained but has not yet finally been read. On 3/23 of 2018 patient's ammonia was bumped up to 51 however today it is dropped to 27 after being started on lactulose.    MRI was attempted today but due to patient's agitation and was not able to be completed.  As far as home medications prior to hospitalization patient was on multiple medications that could add to his encephalopathy. These include Seroquel, Benadryl, Depakote ER to 50 mg every morning and Depakote sprinkle 125 mg daily at bedtime Dilaudid, Remeron, Namenda.  Currently he is on Rocephin, Depakote 250 mg twice a day,  Neurology was asked to see patient to further evaluate his ongoing and delirium/encephalopathy/hallucinations.  Patient is  currently on Vancocin and Zosyn along with Rocephin. His urine culture grew ProvidenciA  Palliative care has been consult good for goals of care  Currently patient is laying in his bed talking to people who are not in the room and confabulating with words that do not make sense. He continually is asking for help. He follows no commands. He is resisting any type of formal musculoskeletal exam. However he does count fingers. And he does track me in the room.  Past Medical History:  Diagnosis Date  . Acute on chronic renal failure (HCC)   . Arthritis    "normal for my age" (11/13/2016)  . Cellulitis and abscess of leg 11/2016  . Chronic anemia   . Chronic atrial fibrillation (HCC)   . Chronic diastolic CHF (congestive heart failure) (HCC) 12/2002   a. EF 50-55% by echo 04/2012  . Decubitus ulcer of buttock, unstageable (  HCC) 12/31/2016  . Edema of lower extremity   . Gout   . H/O: GI bleed    "cause I took too much aspirin"  . High cholesterol   . Hyperglycemia    Noted 05/2012  . Hypertension   . Morbid obesity (HCC)   . PVD (peripheral vascular disease) (HCC)   . Venous stasis ulcer (HCC)     Past Surgical History:  Procedure Laterality Date  . DEBRIDMENT OF DECUBITUS ULCER N/A 01/02/2017   Procedure: DEBRIDMENT OF DECUBITUS ULCER;  Surgeon: Axel Filler, MD;  Location: [facility] OR;  Service: General;  Laterality: N/A;  . NO PAST SURGERIES      Family History  Problem Relation Age of Onset  . Arrhythmia Father   . Hypertension Father   . Cancer Mother     cervical     Social History:  reports that he quit smoking about 51 years ago. His smoking use included Cigarettes. He has never used smokeless tobacco. He reports that he drinks about 8.4 oz of alcohol per week . He reports that he does not use drugs.  Allergies  Allergen Reactions  . Amlodipine Besylate Swelling and Other (See Comments)      . Levaquin [Levofloxacin In D5w] Rash    MEDICATIONS:                                                                                                                      Scheduled: . sodium chloride   Intravenous Once  . apixaban  5 mg Oral BID  . cefTRIAXone (ROCEPHIN)  IV  1 g Intravenous Q24H  . chlorhexidine  15 mL Mouth Rinse BID  . divalproex  250 mg Oral Q12H  . feeding supplement (ENSURE ENLIVE)  237 mL Oral BID BM  . feeding supplement (PRO-STAT SUGAR FREE 64)  30 mL Oral BID  . lactulose  20 g Oral BID  . mouth rinse  15 mL Mouth Rinse BID  . memantine  5 mg Oral BID  . pantoprazole  40 mg Oral QHS  . sodium chloride flush  10-40 mL Intracatheter Q12H  . sodium chloride flush  3 mL Intravenous Q12H     ROS:                                                                                                                                       History obtained  from unobtainable from patient due to mental status     Blood pressure (!) 160/84, pulse (!) 106, temperature 98.8 F (37.1 C), temperature source Axillary, resp. rate (!) 25, height 5\' 10"  (1.778 m), weight 126.6 kg (279 lb), SpO2 93 %.   Neurologic Examination:                                                                                                      HEENT-  Normocephalic, no lesions, without obvious abnormality.  Normal external eye and conjunctiva.  Normal TM's bilaterally.  Normal auditory canals and external ears. Normal external nose, mucus membranes and septum.  Normal pharynx. Cardiovascular- irregularly irregular rhythm, pulses palpable throughout   Lungs- chest clear, no wheezing, rales, normal symmetric air entry, Heart exam - S1, S2 normal, no murmur, no gallop, rate regular Abdomen- normal findings: bowel sounds normal Extremities- no edema Lymph-no adenopathy palpable Musculoskeletal-no joint tenderness, deformity or swelling Skin-warm and dry, no hyperpigmentation, vitiligo, or suspicious lesions  Neurological Examination Mental Status: Patient is alert he is not  oriented his speech is clear however there is paraphasic errors and confabulation of words. He is a lucent 89 talking to people that are in the room. He will not let formal exam take place Cranial Nerves: II:  Visual fields grossly normal, pupils equal, round, reactive to light and accommodation III,IV, VI: ptosis not present, extra-ocular motions intact bilaterally V,VII: smile symmetric, facial light touch sensation normal bilaterally  Motor: Right : Upper extremity   5/5    Left:     Upper extremity   5/5  Bilateral legs are held in 45 flexion and he will not allow me to touch his legs or move his legs. He will not move his legs on his own. Per nurse daughter states that this is his baseline Tone and bulk:normal tone throughout; no atrophy noted Sensory: She withdraws from pain in all 4 extremities Deep Tendon Reflexes: 1+ and symmetric throughout Plantars: Mute bilaterally Cerebellar: Unable to assess  Gait: Unable to assess      Lab Results: Basic Metabolic Panel:  Recent Labs Lab 02/27/17 0447 02/28/17 0410 03/01/17 0400 03/02/17 0431 03/04/17 0443  NA 141 142 138 140 143  K 3.5 3.3* 3.8 3.8 4.6  CL 101 102 101 99* 101  CO2 31 31 32 33* 35*  GLUCOSE 103* 87 89 95 82  BUN 11 10 9 10 10   CREATININE 0.99 0.96 0.87 0.84 0.81  CALCIUM 8.3* 8.4* 8.5* 8.7* 9.2    Liver Function Tests:  Recent Labs Lab 02/26/17 0334 02/27/17 0447 02/28/17 0410  AST 14* 17 15  ALT 12* 12* 11*  ALKPHOS 132* 139* 126  BILITOT 1.3* 1.2 1.0  PROT 5.1* 5.6* 5.4*  ALBUMIN 1.7* 1.7* 1.6*   No results for input(s): LIPASE, AMYLASE in the last 168 hours.  Recent Labs Lab 02/27/17 1447 03/03/17 1800  AMMONIA 51* 27    CBC:  Recent Labs Lab 02/27/17 0447 02/28/17 0410 03/01/17 0400 03/02/17 0431 03/04/17 0443  WBC 8.3 8.1 6.5 6.8 8.8  HGB 8.4*  7.9* 7.9* 8.4* 8.5*  HCT 28.1* 26.3* 27.0* 28.1* 29.0*  MCV 94.0 94.6 95.4 95.3 99.3  PLT 291 281 248 260 271    Cardiac  Enzymes: No results for input(s): CKTOTAL, CKMB, CKMBINDEX, TROPONINI in the last 168 hours.  Lipid Panel: No results for input(s): CHOL, TRIG, HDL, CHOLHDL, VLDL, LDLCALC in the last 168 hours.  CBG:  Recent Labs Lab 03/02/17 1649  GLUCAP 129*    Microbiology: Results for orders placed or performed during the hospital encounter of 02/24/17  Culture, blood (routine x 2)     Status: None   Collection Time: 02/24/17  2:21 PM  Result Value Ref Range Status   Specimen Description BLOOD RIGHT UPPER  Final   Special Requests BOTTLES DRAWN AEROBIC ONLY 5CC  Final   Culture NO GROWTH 5 DAYS  Final   Report Status 03/01/2017 FINAL  Final  Culture, blood (routine x 2)     Status: None   Collection Time: 02/24/17  3:48 PM  Result Value Ref Range Status   Specimen Description BLOOD LEFT ANTECUBITAL  Final   Special Requests BOTTLES DRAWN AEROBIC ONLY 10CC  Final   Culture NO GROWTH 5 DAYS  Final   Report Status 03/01/2017 FINAL  Final  Urine culture     Status: Abnormal   Collection Time: 02/24/17  4:01 PM  Result Value Ref Range Status   Specimen Description URINE, CATHETERIZED  Final   Special Requests NONE  Final   Culture >=100,000 COLONIES/mL PROVIDENCIA STUARTII (A)  Final   Report Status 02/26/2017 FINAL  Final   Organism ID, Bacteria PROVIDENCIA STUARTII (A)  Final      Susceptibility   Providencia stuartii - MIC*    AMPICILLIN >=32 RESISTANT Resistant     CEFAZOLIN >=64 RESISTANT Resistant     CEFTRIAXONE <=1 SENSITIVE Sensitive     CIPROFLOXACIN 2 INTERMEDIATE Intermediate     GENTAMICIN RESISTANT Resistant     IMIPENEM 2 SENSITIVE Sensitive     NITROFURANTOIN 128 RESISTANT Resistant     TRIMETH/SULFA >=320 RESISTANT Resistant     AMPICILLIN/SULBACTAM >=32 RESISTANT Resistant     PIP/TAZO <=4 SENSITIVE Sensitive     * >=100,000 COLONIES/mL PROVIDENCIA STUARTII  MRSA PCR Screening     Status: None   Collection Time: 02/24/17  9:34 PM  Result Value Ref Range Status    MRSA by PCR NEGATIVE NEGATIVE Final    Comment:        The GeneXpert MRSA Assay (FDA approved for NASAL specimens only), is one component of a comprehensive MRSA colonization surveillance program. It is not intended to diagnose MRSA infection nor to guide or monitor treatment for MRSA infections.   C difficile quick scan w PCR reflex     Status: None   Collection Time: 02/25/17  9:53 AM  Result Value Ref Range Status   C Diff antigen NEGATIVE NEGATIVE Final   C Diff toxin NEGATIVE NEGATIVE Final   C Diff interpretation No C. difficile detected.  Final    Coagulation Studies: No results for input(s): LABPROT, INR in the last 72 hours.  Imaging: No results found.     Assessment and plan per attending neurologist  Felicie Morn [name] [facility] [phone]  03/04/2017, 2:02 PM   Assessment/Plan: 71 year old male admitted with sacral decubitus infection who has had difficulty with hypoxic and hypercarbic respiratory failure during this admission who has developed delirium. With denial of headache and no signs of meningismus, I doubt CNS infection. I  suspect this is due to metabolic encephalopathy setting of infection, hospitalization, respiratory failure with a history of dementia(he was on Namenda at baseline).   1) once able to obtain, I do think an MRI would be reasonable 2) repeat urinalysis, urine culture 3) further workup of fever per internal medicine 4) neurology will continue to follow    Ritta SlotMcNeill Felix Pratt, MD [facility] [phone]  If 7pm- 7am, please page neurology on call as listed in AMION.

## 2017-03-04 NOTE — Progress Notes (Signed)
Talked with nurse Rolly SalterHaley. Will attempt EEG tomorrow. Pt is currently combative and agitated

## 2017-03-04 NOTE — Progress Notes (Signed)
Patient yelling out and cursing, verbally and physically abusive to staff. Patient is severely agitated and aggressive. Patient is pulling at leads and won't keep oxygen on. Visual and auditory hallucination noted. Mitts placed on patient for patient safety. Patient repositioned. RN unable to perform wound care at this time due to patient's combativeness. Unable to perform MRI due to patient's condition. Md notified.   Curtis SitesHayley H Alejandra Barna, RN

## 2017-03-04 NOTE — Progress Notes (Signed)
Patient becoming increasingly agitated, refusing to wear BiPap. Still only oriented to self.  MD notified.

## 2017-03-05 ENCOUNTER — Inpatient Hospital Stay (HOSPITAL_COMMUNITY): Payer: Medicare HMO

## 2017-03-05 LAB — BASIC METABOLIC PANEL
Anion gap: 8 (ref 5–15)
BUN: 10 mg/dL (ref 6–20)
CHLORIDE: 102 mmol/L (ref 101–111)
CO2: 32 mmol/L (ref 22–32)
Calcium: 9.2 mg/dL (ref 8.9–10.3)
Creatinine, Ser: 0.92 mg/dL (ref 0.61–1.24)
Glucose, Bld: 100 mg/dL — ABNORMAL HIGH (ref 65–99)
POTASSIUM: 4 mmol/L (ref 3.5–5.1)
SODIUM: 142 mmol/L (ref 135–145)

## 2017-03-05 LAB — CBC
HEMATOCRIT: 28.1 % — AB (ref 39.0–52.0)
Hemoglobin: 8.4 g/dL — ABNORMAL LOW (ref 13.0–17.0)
MCH: 28.5 pg (ref 26.0–34.0)
MCHC: 29.9 g/dL — ABNORMAL LOW (ref 30.0–36.0)
MCV: 95.3 fL (ref 78.0–100.0)
PLATELETS: 264 10*3/uL (ref 150–400)
RBC: 2.95 MIL/uL — AB (ref 4.22–5.81)
RDW: 16.8 % — AB (ref 11.5–15.5)
WBC: 7.3 10*3/uL (ref 4.0–10.5)

## 2017-03-05 MED ORDER — DEXTROSE-NACL 5-0.45 % IV SOLN
INTRAVENOUS | Status: DC
  Administered 2017-03-05 (×2): via INTRAVENOUS

## 2017-03-05 NOTE — Progress Notes (Signed)
Daily Progress Note   Patient Name: Jesus [name]       Date: 03/05/2017 DOB: 08/30/1946  Age: 71 y.o. MRN#: [id number] Attending Physician: Cathren Harsh, MD Primary Care Physician: Miki Kins Admit Date: 02/24/2017  Reason for Consultation/Follow-up:  Establishing goals of carewith complicated wounds that are unlikely to heal and multiple admission. Declining health and family struggling. Palliative asked to help with GOC.    Subjective: Jesus [name] is more alert today. Knows he is at [facility] hospital.   Length of Stay: 9  Current Medications: Scheduled Meds:  . sodium chloride   Intravenous Once  . apixaban  5 mg Oral BID  . cefTRIAXone (ROCEPHIN)  IV  1 g Intravenous Q24H  . chlorhexidine  15 mL Mouth Rinse BID  . divalproex  250 mg Oral Q12H  . feeding supplement (ENSURE ENLIVE)  237 mL Oral BID BM  . feeding supplement (PRO-STAT SUGAR FREE 64)  30 mL Oral BID  . lactulose  20 g Oral BID  . mouth rinse  15 mL Mouth Rinse BID  . memantine  5 mg Oral BID  . pantoprazole  40 mg Oral QHS  . sodium chloride flush  10-40 mL Intracatheter Q12H  . sodium chloride flush  3 mL Intravenous Q12H    Continuous Infusions: . dextrose 5 % and 0.45% NaCl 75 mL/hr at 03/05/17 1212    PRN Meds: acetaminophen **OR** acetaminophen, fentaNYL (SUBLIMAZE) injection, haloperidol lactate, HYDROcodone-acetaminophen, hydrocortisone cream, ondansetron **OR** ondansetron (ZOFRAN) IV, sodium chloride flush  Physical Exam  Constitutional: He appears well-developed.  HENT:  Head: Normocephalic and atraumatic.  Cardiovascular: Normal rate.  An irregularly irregular rhythm present.  Pulmonary/Chest: Effort normal. No accessory muscle usage. No tachypnea. No respiratory distress.  Abdominal:  Soft. Normal appearance.  Neurological: He is alert. He is disoriented.  Nursing note and vitals reviewed.           Vital Signs: BP 127/71 (BP Location: Left Arm)   Pulse 72   Temp 97.9 F (36.6 C) (Oral)   Resp (!) 27   Ht 5\' 10"  (1.778 m)   Wt 126.6 kg (279 lb)   SpO2 94%   BMI 40.03 kg/m  SpO2: SpO2: 94 % O2 Device: O2 Device: Not Delivered O2 Flow Rate: O2 Flow Rate (L/min): 2 L/min  Intake/output  summary:   Intake/Output Summary (Last 24 hours) at 03/05/17 1333 Last data filed at 03/05/17 1212  Gross per 24 hour  Intake           506.75 ml  Output             1000 ml  Net          -493.25 ml   LBM: Last BM Date: 03/01/17 Baseline Weight: Weight: 126.6 kg (279 lb) Most recent weight: Weight: 126.6 kg (279 lb)       Palliative Assessment/Data:    Flowsheet Rows     Most Recent Value  Intake Tab  Referral Department  Hospitalist  Unit at Time of Referral  Intermediate Care Unit  Palliative Care Primary Diagnosis  Cardiac  Date Notified  02/25/17  Palliative Care Type  Return patient Palliative Care  Reason for referral  Clarify Goals of Care  Date of Admission  02/24/17  # of days IP prior to Palliative referral  1  Clinical Assessment  Psychosocial & Spiritual Assessment  Palliative Care Outcomes      Patient Active Problem List   Diagnosis Date Noted  . Acute respiratory failure with hypoxia and hypercapnia (HCC) 02/28/2017  . Acute pulmonary edema (HCC) 02/28/2017  . Sepsis secondary to UTI (HCC) 02/28/2017  . Goals of care, counseling/discussion   . Palliative care encounter   . Acute encephalopathy 02/24/2017  . Palliative care by specialist   . Decubitus ulcer of sacral region, unstageable (HCC) 12/30/2016  . Altered mental status 12/30/2016  . Hyponatremia 07/15/2015  . Hypokalemia   . Chronic diastolic CHF (congestive heart failure) (HCC) 04/02/2015  . Pulmonary vascular congestion   . Peripheral edema   . PAOD (peripheral arterial  occlusive disease) (HCC) 02/17/2014  . Renal artery stenosis (HCC) 02/17/2014  . Peripheral vascular disease (HCC) 04/15/2013  . Morbid obesity (HCC) 06/11/2012  . Unspecified essential hypertension 05/18/2012  . Atrial fibrillation (HCC) 04/27/2012    Palliative Care Assessment & Plan   HPI: 71 y.o.malewith past medical history of multiple complicated nonhealing decubitus ulcers with recent admission requiring surgical debridement complicated by aspiration pneumonica, Atrial fibrillation on Eliquis, diastolic heart failure, HTN, chronic anemia, chronic venous insufficiency, chronic edema, liver cirrhosis per CT, lesions in liver and pancreas per CT (thought to be cyst vs pseudocyst)admitted on 3/20/2018with hypoxia followed by AMS and hypercarbia. Went to [facility] from recent admission and just recently returned to SNF [facility] Family says he did very well in LTAC and wounds were controlled well but very unhappy as they feel his decline has been just since he has been back in SNF.   Assessment: Jesus [name] is better than yesterday. However, this is a cycle and we are in no better place than when he was first admitted. This is not going to get any better unfortunately.   I attempted to call Jesus [name]'s daughter/HCPOA, [name], but she did not answer. I have been trying to keep her updated and keep in touch. She did not return my call today. Left brief update on her voicemail. She has good understanding about her father's poor health and the barriers to having any improvement. She has not been prepared or open to more comfort measures. Will continue to try and re-engage.   Recommendations/Plan:  BiPAP as needed, continue aggressive medical management  Be cautious with sedating meds (opioids/benzos) as these seem to lead to hypercarbia  Consider haldol if QTc allows. Could also consider Zyprexa.  Would consider CPAP/BiPAP qhs but he will probably refuse (dtr/HCPOA  understands and agrees)  Difficult situation as he is noncompliant but also does not understand importance of interventions  I will continue to try and support and help family  Goals of Care and Additional Recommendations:  Limitations on Scope of Treatment: Full Scope Treatment outside DNR  Code Status:  DNR  Prognosis:   Unable to determinebut likely poor with multiple comorbitities and poor chance of improving  Discharge Planning:  Skilled Nursing Facility for rehab with Palliative care service follow-up   Thank you for allowing the Palliative Medicine Team to assist in the care of this patient.   Total Time 15min Prolonged Time Billed  no       Greater than 50%  of this time was spent counseling and coordinating care related to the above assessment and plan.  Yong ChannelAlicia Chord Takahashi, NP Palliative Medicine Team Pager # [phone] (M-F 8a-5p) Team Phone # [phone] (Nights/Weekends)

## 2017-03-05 NOTE — Consult Note (Addendum)
WOC consult requested for multiple wounds; this was already performed on 3/21; refer to progress notes for measurements and wound assessments. Pt has multiple systemic factors which can impair healing. Topical treatment orders have been provided for bedside nurses to perform.   Please re-consult if further assistance is needed.  Thank-you,  Cammie Mcgeeawn Arvel Oquinn MSN, RN, CWOCN, KimballWCN-AP, CNS [phone]

## 2017-03-05 NOTE — Procedures (Signed)
ELECTROENCEPHALOGRAM REPORT  Date of Study: 03/05/2017  Patient's Name: Jesus [name] MRN: [id number] Date of Birth: [birth year]  Referring Provider: Dr. Thad Rangeripudeep Rai  Clinical History: This is a 71 year old man with confusion.  Medications: divalproex (DEPAKOTE SPRINKLE) capsule 250 mg  acetaminophen (TYLENOL) tablet 650 mg  apixaban (ELIQUIS) tablet 5 mg  cefTRIAXone (ROCEPHIN) 1 g in dextrose 5 % 50 mL IVPB  chlorhexidine (PERIDEX) 0.12 % solution 15 mL  dextrose 5 %-0.45 % sodium chloride infusion  haloperidol lactate (HALDOL) injection 1 mg  HYDROcodone-acetaminophen (NORCO/VICODIN) 5-325 MG per tablet 1 tablet  hydrocortisone cream 1 %  lactulose (CHRONULAC) 10 GM/15ML solution 20 g  memantine (NAMENDA) tablet 5 mg   Technical Summary: A multichannel digital EEG recording measured by the international 10-20 system with electrodes applied with paste and impedances below 5000 ohms performed as portable with EKG monitoring in an awake and drowsy patient.  Hyperventilation and photic stimulation were not performed.  The digital EEG was referentially recorded, reformatted, and digitally filtered in a variety of bipolar and referential montages for optimal display.   Description: The patient is awake and dorwsy during the recording.  There is no clear posterior dominant rhythm. The background consists of a large amount of diffuse 4-5 Hz theta and occasional 2-3 Hz delta slowing with no focal abnormalities seen. Normal sleep architecture is not seen. Hyperventilation and photic stimulation were not performed..  There were no epileptiform discharges or electrographic seizures seen.    EKG lead showed irregular rhythm.  Impression: This awake and drows EEG is abnormal due to mild to moderate diffuse slowing of the waking background.  Clinical Correlation of the above findings indicates diffuse cerebral dysfunction that is non-specific in etiology and can be seen with  hypoxic/ischemic injury, toxic/metabolic encephalopathies, neurodegenerative disorders, or medication effect.  The absence of epileptiform discharges does not rule out a clinical diagnosis of epilepsy.  Clinical correlation is advised.   Patrcia DollyKaren Junette Bernat, M.D.

## 2017-03-05 NOTE — Progress Notes (Signed)
EEG completed; results pending.    

## 2017-03-05 NOTE — Care Management Important Message (Signed)
Important Message  Patient Details  Name: Jesus [name] MRN: [id number] Date of Birth: 1945/12/23   Medicare Important Message Given:  Yes    Kyla BalzarineShealy, Antwoine Zorn Abena 03/05/2017, 3:34 PM

## 2017-03-05 NOTE — Progress Notes (Signed)
[facility]                                                                              Patient Demographics  Jesus [name], is a 71 y.o. male, DOB - September 05, 1946, [id number]  Admit date - 02/24/2017   Admitting Physician Michael LitterNikki Carter, MD  Outpatient Primary MD for the patient is [name],[name], [name]  Outpatient specialists:   LOS - 9  days    Chief Complaint  Patient presents with  . Altered Mental Status       Brief summary   70 y.o.male with a history of chronic atrial fibrillation (anticoagulated with Eliquis; CHADS-Vasc score of 4), CKD 3, chronic diastolic heart failure, HTN, chronic anemia, chronic pain, chronic venous insufficiency, chronic edema, cirrhosis of the liver by CT, incidental lesions on his liver and pancreas seen on prior CT imaging. He was recently hospitalized from 1/22-2/16 for aspiration pneumonia and infected sacral decubitus ulcer requiring surgical debridement. The wound ultimately required a wound vac and [facility] According to his daughter, he did well there, but has declined quickly [facility]. Patient found to have altered mental status and probable sepsis.   Assessment & Plan   Acute hypoxic and hypercarbic respiratory failure / Acute pulmonary edema - CXR on admission showed CHF with pulmonary interstitial edema and small right pleural effusion - On admission, patient was placed on BiPAP, subsequently his respiratory status was improved and patient was transferred to the floor.  - He was evaluated by SLP - recommendation was for regular diet  - On 3/27, patient was noticed to be lethargic, not following commands, had received pain medication in the morning but did not respond to Narcan. ABG showed pH of 7.166 pCO2 102, pO2 84.6. Patient was transferred back to stepdown unit and placed on BiPAP (DNR status). - May have underlying OSA or obesity hypoventilation, will need  outpatient sleep study to qualify for CPAP. - Patient now off the BiPAP. Palliative medicine also reconsulted for goals of care - O2 sats 94% on RA   Acute metabolic encephalopathy likely due to #1, underlying dementia  - Ammonia level was normal, - much more alert and oriented today  - EEG showed mild to moderate diffuse slowing, no seizures - MRI was canceled due to patient's agitation yesterday. Neurology signed off, recommended no further work-up. D/w Dr Amada JupiterKirkpatrick, no need of MRI at this time as patient is close to baseline and has history of dementia. - also noted patient is on namenda, seroquel, remeron, depakote on PTA meds, I have requested psych consult to adjust meds    Sepsissecondary to catheter associated Providenicia stuartii UTI as well as cellulitis and superimposed infection involving chronic nonhealing sacral decubitus ulcer, both present on admission. - Urine cx grew Providencia, Blood cx so far showed no growth - Pt was on vanco and zosyn but this was narrowed down to Rocephin 3/22  Mood disorder - Continue Depakote   Chronic atrial fibrillation / Coagulopathy  - CHADS vasc score 4 - Continue anticoagulation with apixaban - Rate controlled without beta blockers  Anemia of chronic disease - Due to bone marrow suppression from history of liver cirrhosis - Has had 2 units PRBC transfusion on 3/22 - Hemoglobin stable, transfuse for hemoglobin less than 7.5  Diarrhea - C. difficile negative - No diarrhea in past   Hypokalemia - Due to sepsis, diarrhea, Currently stable  Chronic diastolic heart failure - Compensated - 2 D ECHO in 12/2014 showed EF 55-60%  Liver cirrhosis - Continue lactulose  - Continue protonix   Multiple wounds, sacral wounds, stage 4 sacral decubitus ulcer - Stage IV sacrum, partial thickness buttocks, bilateral pretibial and calves, left lat foot, left inner thigh. Unstageable to bilateral heels, and lateral plantar  surface of R foot, DTI to L medial heel - Sacrum 15cm x 18cm x 6cm deep stage 4 - Buttocks with multiple various size partial thickness wounds - Unstageable on Lateral plantar surface of R foot 1.5cm x 2cm x 0cm  - Unstageable R heel 2.5cm x 6cm x 0cm - R lateral calf 6cm x 3cm x 0.1cm partial thickness 5 - Proximal to right ankle 2.5cm x 0.5cm x 0cm dark purple.  - Two intact blisters to inner left thigh 6cm x 1.5cm and 3cm x 1cm - Left lateral calf 6cm x 3cm x 0.1cm partial thickness and one 3cm  - Left medial heel 2.5cm x 2.5cm  - Left lateral foot 4cm x 4cm and a 2cm x 4cm  - Left posterior calf with 4cm x 3cm x 0cm  - Dressing procedure/placement/frequency: NS moistened W to D dressing BID for sacral ulcer. Xeroform and foam to wounds on heels, foam to intact blisters, xeroform and gauze to partial thickness on BLE calves. Pt is already on a specialty bed. Pt already has pressure alleviating boots on.   Code Status: DNR  DVT Prophylaxis:  apixaban Family Communication: no family at bedside today.   Disposition Plan: *will need SNF.   Time Spent in minutes   25  minutes  Procedures:  BiPAP  Consultants:   Palliative care Neurology   Antimicrobials:   Rocephin   Vanco and zosyn 3/20 --> 3/22    Medications  Scheduled Meds: . sodium chloride   Intravenous Once  . apixaban  5 mg Oral BID  . cefTRIAXone (ROCEPHIN)  IV  1 g Intravenous Q24H  . chlorhexidine  15 mL Mouth Rinse BID  . divalproex  250 mg Oral Q12H  . feeding supplement (ENSURE ENLIVE)  237 mL Oral BID BM  . feeding supplement (PRO-STAT SUGAR FREE 64)  30 mL Oral BID  . lactulose  20 g Oral BID  . mouth rinse  15 mL Mouth Rinse BID  . memantine  5 mg Oral BID  . pantoprazole  40 mg Oral QHS  . sodium chloride flush  10-40 mL Intracatheter Q12H  . sodium chloride flush  3 mL Intravenous Q12H   Continuous Infusions: . dextrose 5 % and 0.45% NaCl 75 mL/hr at 03/05/17 1212   PRN Meds:.acetaminophen  **OR** acetaminophen, fentaNYL (SUBLIMAZE) injection, haloperidol lactate, HYDROcodone-acetaminophen, hydrocortisone cream, ondansetron **OR** ondansetron (ZOFRAN) IV, sodium chloride flush   Antibiotics   Anti-infectives    Start     Dose/Rate Route Frequency Ordered Stop   02/27/17 1400  cefTRIAXone (ROCEPHIN) 1 g in dextrose 5 % 50 mL IVPB     1 g 100 mL/hr over 30 Minutes Intravenous Every 24 hours 02/27/17 1245     02/26/17 2000  vancomycin (VANCOCIN) IVPB 1000 mg/200 mL premix  Status:  Discontinued  1,000 mg 200 mL/hr over 60 Minutes Intravenous Every 24 hours 02/26/17 0809 02/27/17 1245   02/25/17 0800  vancomycin (VANCOCIN) IVPB 1000 mg/200 mL premix  Status:  Discontinued     1,000 mg 200 mL/hr over 60 Minutes Intravenous Every 12 hours 02/24/17 2102 02/26/17 0809   02/25/17 0600  piperacillin-tazobactam (ZOSYN) IVPB 3.375 g  Status:  Discontinued     3.375 g 12.5 mL/hr over 240 Minutes Intravenous Every 8 hours 02/24/17 2102 02/27/17 1245   02/24/17 1930  vancomycin (VANCOCIN) 2,000 mg in sodium chloride 0.9 % 500 mL IVPB     2,000 mg 250 mL/hr over 120 Minutes Intravenous  Once 02/24/17 1915 02/24/17 2201   02/24/17 1915  piperacillin-tazobactam (ZOSYN) IVPB 3.375 g     3.375 g 100 mL/hr over 30 Minutes Intravenous  Once 02/24/17 1907 02/24/17 2359   02/24/17 1915  vancomycin (VANCOCIN) IVPB 1000 mg/200 mL premix  Status:  Discontinued     1,000 mg 200 mL/hr over 60 Minutes Intravenous  Once 02/24/17 1907 02/24/17 1915   02/24/17 1815  cefTRIAXone (ROCEPHIN) injection 1 g  Status:  Discontinued     1 g Intramuscular  Once 02/24/17 1801 02/24/17 1907        Subjective:   Cartez Lizaola was seen and examined today. Much more alert and oriented today, close to baseline. No fevers, denies any dizziness, CP, SOB, abd pain, nausea or vomiting.   Objective:   Vitals:   03/05/17 0415 03/05/17 0749 03/05/17 1043 03/05/17 1212  BP: (!) 149/95 (!) 144/80 105/78 127/71   Pulse: 78 82 (!) 109 72  Resp: (!) 27 (!) 29 (!) 28 (!) 27  Temp: 98.7 F (37.1 C)  97.8 F (36.6 C) 97.9 F (36.6 C)  TempSrc: Oral Oral Oral Oral  SpO2: 93% 98% 90% 94%  Weight:      Height:        Intake/Output Summary (Last 24 hours) at 03/05/17 1500 Last data filed at 03/05/17 1212  Gross per 24 hour  Intake           506.75 ml  Output             1000 ml  Net          -493.25 ml     Wt Readings from Last 3 Encounters:  02/24/17 126.6 kg (279 lb)  02/17/17 136.1 kg (300 lb)  02/16/17 (!) 136.2 kg (300 lb 3.2 oz)     Exam  General: Alert and oriented x 2, NAD   HEENT:   Neck: Supple, no JVD,   Cardiovascular: S1 S2 auscultated, no rubs, murmurs or gallops. Regular rate and rhythm.  Respiratory: Clear to auscultation bilaterally, no wheezing, rales or rhonchi  Gastrointestinal: Soft, nontender, nondistended, + bowel sounds  Ext: no cyanosis clubbing or edema  Neuro:   Skin: Pressure boots on bilaterally on the heels  Psych: alert and oriented, following commands   Data Reviewed:  I have personally reviewed following labs and imaging studies  Micro Results Recent Results (from the past 240 hour(s))  Culture, blood (routine x 2)     Status: None   Collection Time: 02/24/17  2:21 PM  Result Value Ref Range Status   Specimen Description BLOOD RIGHT UPPER  Final   Special Requests BOTTLES DRAWN AEROBIC ONLY 5CC  Final   Culture NO GROWTH 5 DAYS  Final   Report Status 03/01/2017 FINAL  Final  Culture, blood (routine x 2)  Status: None   Collection Time: 02/24/17  3:48 PM  Result Value Ref Range Status   Specimen Description BLOOD LEFT ANTECUBITAL  Final   Special Requests BOTTLES DRAWN AEROBIC ONLY 10CC  Final   Culture NO GROWTH 5 DAYS  Final   Report Status 03/01/2017 FINAL  Final  Urine culture     Status: Abnormal   Collection Time: 02/24/17  4:01 PM  Result Value Ref Range Status   Specimen Description URINE, CATHETERIZED  Final    Special Requests NONE  Final   Culture >=100,000 COLONIES/mL PROVIDENCIA STUARTII (A)  Final   Report Status 02/26/2017 FINAL  Final   Organism ID, Bacteria PROVIDENCIA STUARTII (A)  Final      Susceptibility   Providencia stuartii - MIC*    AMPICILLIN >=32 RESISTANT Resistant     CEFAZOLIN >=64 RESISTANT Resistant     CEFTRIAXONE <=1 SENSITIVE Sensitive     CIPROFLOXACIN 2 INTERMEDIATE Intermediate     GENTAMICIN RESISTANT Resistant     IMIPENEM 2 SENSITIVE Sensitive     NITROFURANTOIN 128 RESISTANT Resistant     TRIMETH/SULFA >=320 RESISTANT Resistant     AMPICILLIN/SULBACTAM >=32 RESISTANT Resistant     PIP/TAZO <=4 SENSITIVE Sensitive     * >=100,000 COLONIES/mL PROVIDENCIA STUARTII  MRSA PCR Screening     Status: None   Collection Time: 02/24/17  9:34 PM  Result Value Ref Range Status   MRSA by PCR NEGATIVE NEGATIVE Final    Comment:        The GeneXpert MRSA Assay (FDA approved for NASAL specimens only), is one component of a comprehensive MRSA colonization surveillance program. It is not intended to diagnose MRSA infection nor to guide or monitor treatment for MRSA infections.   C difficile quick scan w PCR reflex     Status: None   Collection Time: 02/25/17  9:53 AM  Result Value Ref Range Status   C Diff antigen NEGATIVE NEGATIVE Final   C Diff toxin NEGATIVE NEGATIVE Final   C Diff interpretation No C. difficile detected.  Final    Radiology Reports Dg Chest Port 1 View  Result Date: 02/26/2017 CLINICAL DATA:  PICC line. EXAM: PORTABLE CHEST 1 VIEW COMPARISON:  02/24/2017 . FINDINGS: PICC line noted with tip over the cavoatrial junction. Cardiomegaly with pulmonary interstitial prominence. Small right pleural effusion. No pneumothorax . IMPRESSION: 1. PICC line noted with tip projected over the cavoatrial junction. 2. Congestive heart failure with pulmonary interstitial edema and small right pleural effusion . Electronically Signed   By: Maisie Fus  Register   On:  02/26/2017 11:37   Dg Chest Port 1 View  Result Date: 02/24/2017 CLINICAL DATA:  Altered mental status EXAM: PORTABLE CHEST 1 VIEW COMPARISON:  January 20, 2017 FINDINGS: There is persistent cardiomegaly. There is pulmonary venous hypertension. There is no edema or consolidation. No adenopathy. No bone lesions. IMPRESSION: Pulmonary vascular congestion without edema or consolidation. Electronically Signed   By: Bretta Bang III M.D.   On: 02/24/2017 15:08    Lab Data:  CBC:  Recent Labs Lab 02/28/17 0410 03/01/17 0400 03/02/17 0431 03/04/17 0443 03/05/17 0413  WBC 8.1 6.5 6.8 8.8 7.3  HGB 7.9* 7.9* 8.4* 8.5* 8.4*  HCT 26.3* 27.0* 28.1* 29.0* 28.1*  MCV 94.6 95.4 95.3 99.3 95.3  PLT 281 248 260 271 264   Basic Metabolic Panel:  Recent Labs Lab 02/28/17 0410 03/01/17 0400 03/02/17 0431 03/04/17 0443 03/05/17 0413  NA 142 138 140 143 142  K  3.3* 3.8 3.8 4.6 4.0  CL 102 101 99* 101 102  CO2 31 32 33* 35* 32  GLUCOSE 87 89 95 82 100*  BUN 10 9 10 10 10   CREATININE 0.96 0.87 0.84 0.81 0.92  CALCIUM 8.4* 8.5* 8.7* 9.2 9.2   GFR: Estimated Creatinine Clearance: 99.8 mL/min (by C-G formula based on SCr of 0.92 mg/dL). Liver Function Tests:  Recent Labs Lab 02/27/17 0447 02/28/17 0410  AST 17 15  ALT 12* 11*  ALKPHOS 139* 126  BILITOT 1.2 1.0  PROT 5.6* 5.4*  ALBUMIN 1.7* 1.6*   No results for input(s): LIPASE, AMYLASE in the last 168 hours.  Recent Labs Lab 02/27/17 1447 03/03/17 1800  AMMONIA 51* 27   Coagulation Profile: No results for input(s): INR, PROTIME in the last 168 hours. Cardiac Enzymes: No results for input(s): CKTOTAL, CKMB, CKMBINDEX, TROPONINI in the last 168 hours. BNP (last 3 results) No results for input(s): PROBNP in the last 8760 hours. HbA1C: No results for input(s): HGBA1C in the last 72 hours. CBG:  Recent Labs Lab 03/02/17 1649  GLUCAP 129*   Lipid Profile: No results for input(s): CHOL, HDL, LDLCALC, TRIG,  CHOLHDL, LDLDIRECT in the last 72 hours. Thyroid Function Tests: No results for input(s): TSH, T4TOTAL, FREET4, T3FREE, THYROIDAB in the last 72 hours. Anemia Panel: No results for input(s): VITAMINB12, FOLATE, FERRITIN, TIBC, IRON, RETICCTPCT in the last 72 hours. Urine analysis:    Component Value Date/Time   COLORURINE AMBER (A) 03/04/2017 2127   APPEARANCEUR HAZY (A) 03/04/2017 2127   LABSPEC 1.018 03/04/2017 2127   PHURINE 6.0 03/04/2017 2127   GLUCOSEU NEGATIVE 03/04/2017 2127   HGBUR MODERATE (A) 03/04/2017 2127   BILIRUBINUR NEGATIVE 03/04/2017 2127   KETONESUR 20 (A) 03/04/2017 2127   PROTEINUR 100 (A) 03/04/2017 2127   UROBILINOGEN 4.0 (H) 06/02/2012 1200   NITRITE NEGATIVE 03/04/2017 2127   LEUKOCYTESUR SMALL (A) 03/04/2017 2127     Sekai Nayak M.D. [facility] 03/05/2017, 3:00 PM  Pager: [phone] Between 7am to 7pm - call Pager - [phone]  After 7pm go to [email or web address] - password [email or web address]  Call night coverage person covering after 7pm

## 2017-03-05 NOTE — Progress Notes (Signed)
Neurology sign off note  Subjective:  Much improved today. Able to answer questions, name objects and follow commands. He is much calmer today   Vitals:   03/05/17 0415 03/05/17 0749  BP: (!) 149/95 (!) 144/80  Pulse: 78 82  Resp: (!) 27 (!) 29  Temp: 98.7 F (37.1 C)    Gen: In bed, NAD Resp: non-labored breathing, no acute distress Abd: soft, nt   Neuro: Mental:  Alert ann oriented and able to follow commands.  UE:AVWUJWCN:Pupils are equal and round. They are symmetrically reactive from 3-->2 mm. EOMI without nystagmus. Facial sensation is intact to light touch. Face is symmetric at rest with normal strength and mobility. Hearing is intact to conversational voice. Palate elevates symmetrically and uvula is midline. Voice is normal in tone, pitch and quality. Bilateral SCM and trapezii are 5/5. Tongue is midline with normal bulk and mobility.  Motor:Normal bulk, tone, and strength. 5/5 throughout UE with Bilateral legs are held in 45 flexion and he will not allow me to touch his legs or move his legs. He will not move his legs on his own. Per nurse daughter states that this is his baseline Sensation:  Withdraws from pain in all 4 extremities  DTRs:1+, symmetric  Toes downgoing bilaterally. No pathologic reflexes.      Pertinent Labs: none  Brief summary of case/Impression: 71 year old male admitted with sacral decubitus infection who has had difficulty with hypoxic and hypercarbic respiratory failure during this admission who has developed delirium. With denial of headache and no signs of meningismus, Doubt CNS infection. Suspect this is due to metabolic encephalopathy setting of infection, hospitalization, respiratory failure with a history of dementia(he was on Namenda at baseline).   Neurological Diagnoses: 1) Delirium in the setting of hypoxic and hypercarbic respiratory failure and UTI  Recommendations:  2) Neurology to sign off at this time. Please call with any  further questions or concerns.

## 2017-03-05 NOTE — Progress Notes (Signed)
PT Cancellation Note  Patient Details Name: Jesus MooresDouglas M Teagle MRN: [id number] DOB: [birth year]   Cancelled Treatment:    Reason Eval/Treat Not Completed: Patient at procedure or test/unavailable. Pt off of the floor for procedure. PT will continue to f/u with pt as available.    Alessandra BevelsJennifer M Britini Garcilazo 03/05/2017, 10:57 AM

## 2017-03-06 LAB — CBC
HCT: 26.9 % — ABNORMAL LOW (ref 39.0–52.0)
Hemoglobin: 8 g/dL — ABNORMAL LOW (ref 13.0–17.0)
MCH: 28.4 pg (ref 26.0–34.0)
MCHC: 29.7 g/dL — ABNORMAL LOW (ref 30.0–36.0)
MCV: 95.4 fL (ref 78.0–100.0)
PLATELETS: 259 10*3/uL (ref 150–400)
RBC: 2.82 MIL/uL — ABNORMAL LOW (ref 4.22–5.81)
RDW: 17 % — AB (ref 11.5–15.5)
WBC: 6 10*3/uL (ref 4.0–10.5)

## 2017-03-06 LAB — BLOOD GAS, ARTERIAL
Acid-Base Excess: 8.8 mmol/L — ABNORMAL HIGH (ref 0.0–2.0)
BICARBONATE: 32.9 mmol/L — AB (ref 20.0–28.0)
Drawn by: 313941
FIO2: 21
O2 SAT: 94.1 %
PCO2 ART: 46.8 mmHg (ref 32.0–48.0)
PO2 ART: 71.9 mmHg — AB (ref 83.0–108.0)
Patient temperature: 98.9
pH, Arterial: 7.462 — ABNORMAL HIGH (ref 7.350–7.450)

## 2017-03-06 LAB — BASIC METABOLIC PANEL
Anion gap: 6 (ref 5–15)
BUN: 10 mg/dL (ref 6–20)
CO2: 34 mmol/L — ABNORMAL HIGH (ref 22–32)
CREATININE: 0.83 mg/dL (ref 0.61–1.24)
Calcium: 9 mg/dL (ref 8.9–10.3)
Chloride: 101 mmol/L (ref 101–111)
GFR calc Af Amer: >60 mL/min (ref >60)
GLUCOSE: 117 mg/dL — AB (ref 65–99)
Potassium: 3.5 mmol/L (ref 3.5–5.1)
SODIUM: 141 mmol/L (ref 135–145)

## 2017-03-06 LAB — URINE CULTURE: Culture: NO GROWTH

## 2017-03-06 MED ORDER — DEXTROSE-NACL 5-0.45 % IV SOLN
INTRAVENOUS | Status: DC
  Administered 2017-03-06 – 2017-03-07 (×2): via INTRAVENOUS

## 2017-03-06 NOTE — Progress Notes (Signed)
Patient placed on BiPAP due to SOB. Will continue to monitor.

## 2017-03-06 NOTE — Progress Notes (Signed)
Daily Progress Note   Patient Name: Jesus [name]       Date: 03/06/2017 DOB: 30-Sep-1946  Age: 71 y.o. MRN#: [id number] Attending Physician: Cathren Harsh, MD Primary Care Physician: Miki Kins Admit Date: 02/24/2017  Reason for Consultation/Follow-up:  Establishing goals of carewith complicated wounds that are unlikely to heal and multiple admission. Declining health and family struggling. Palliative asked to help with GOC.    Subjective: [name] is back on BiPAP today. He is alert and beginning to pull at mask.   Length of Stay: 10  Current Medications: Scheduled Meds:  . sodium chloride   Intravenous Once  . apixaban  5 mg Oral BID  . cefTRIAXone (ROCEPHIN)  IV  1 g Intravenous Q24H  . chlorhexidine  15 mL Mouth Rinse BID  . divalproex  250 mg Oral Q12H  . feeding supplement (ENSURE ENLIVE)  237 mL Oral BID BM  . feeding supplement (PRO-STAT SUGAR FREE 64)  30 mL Oral BID  . lactulose  20 g Oral BID  . mouth rinse  15 mL Mouth Rinse BID  . memantine  5 mg Oral BID  . pantoprazole  40 mg Oral QHS  . sodium chloride flush  10-40 mL Intracatheter Q12H  . sodium chloride flush  3 mL Intravenous Q12H    Continuous Infusions:   PRN Meds: acetaminophen **OR** acetaminophen, haloperidol lactate, HYDROcodone-acetaminophen, hydrocortisone cream, ondansetron **OR** ondansetron (ZOFRAN) IV, sodium chloride flush  Physical Exam  Constitutional: He appears well-developed.  HENT:  Head: Normocephalic and atraumatic.  Cardiovascular: Normal rate.  An irregularly irregular rhythm present.  Pulmonary/Chest: Effort normal. No accessory muscle usage. No tachypnea. No respiratory distress.  Abdominal: Soft. Normal appearance.  Neurological: He is alert. He is disoriented.    Psychiatric: He is agitated. Cognition and memory are impaired. He expresses impulsivity.  Nursing note and vitals reviewed.           Vital Signs: BP (!) 152/77 (BP Location: Left Arm)   Pulse 77   Temp 98.8 F (37.1 C) (Axillary)   Resp (!) 30   Ht  (1.778 m)   Wt 124.7 kg (275 lb)   SpO2 95%   BMI 39.46 kg/m  SpO2: SpO2: 95 % O2 Device: O2 Device: Not Delivered O2 Flow Rate: O2 Flow Rate (L/min): 2  L/min  Intake/output summary:   Intake/Output Summary (Last 24 hours) at 03/06/17 1413 Last data filed at 03/06/17 1300  Gross per 24 hour  Intake             1215 ml  Output              675 ml  Net              540 ml   LBM: Last BM Date: 03/01/17 Baseline Weight: Weight: 126.6 kg (279 lb) Most recent weight: Weight: 124.7 kg (275 lb)       Palliative Assessment/Data:    Flowsheet Rows     Most Recent Value  Intake Tab  Referral Department  Hospitalist  Unit at Time of Referral  Intermediate Care Unit  Palliative Care Primary Diagnosis  Cardiac  Date Notified  02/25/17  Palliative Care Type  Return patient Palliative Care  Reason for referral  Clarify Goals of Care  Date of Admission  02/24/17  # of days IP prior to Palliative referral  1  Clinical Assessment  Psychosocial & Spiritual Assessment  Palliative Care Outcomes      Patient Active Problem List   Diagnosis Date Noted  . Acute respiratory failure with hypoxia and hypercapnia (HCC) 02/28/2017  . Acute pulmonary edema (HCC) 02/28/2017  . Sepsis secondary to UTI (HCC) 02/28/2017  . Goals of care, counseling/discussion   . Palliative care encounter   . Acute encephalopathy 02/24/2017  . Palliative care by specialist   . Decubitus ulcer of sacral region, unstageable (HCC) 12/30/2016  . Altered mental status 12/30/2016  . Hyponatremia 07/15/2015  . Hypokalemia   . Chronic diastolic CHF (congestive heart failure) (HCC) 04/02/2015  . Pulmonary vascular congestion   . Peripheral edema   .  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 Renal artery stenosis (HCC) 02/17/2014  . Peripheral vascular disease (HCC) 04/15/2013  . Morbid obesity (HCC) 06/11/2012  . Unspecified essential hypertension 05/18/2012  . Atrial fibrillation (HCC) 04/27/2012    Palliative Care Assessment & Plan   HPI: 71 y.o.malewith past medical history of multiple complicated nonhealing decubitus ulcers with recent admission requiring surgical debridement complicated by aspiration pneumonica, Atrial fibrillation on Eliquis, diastolic heart failure, HTN, chronic anemia, chronic venous insufficiency, chronic edema, liver cirrhosis per CT, lesions in liver and pancreas per CT (thought to be cyst vs pseudocyst)admitted on 3/20/2018with hypoxia followed by AMS and hypercarbia. Went to [facility] from recent admission and just recently returned to SNF [facility]). Family says he did very well in LTAC and wounds were controlled well but very unhappy as they feel his decline has been just since he has been back in SNF.   Assessment: [name] remains in tenuous condition. He has baseline confusion and poor understanding that is often exacerbated by his declining health.   I called and spoke with daughter and Mariah Milling. I spoke candidly with [facility] about Mr. Llera's suffering and our inability to improve him. She agrees that he would NOT be compliant with CPAP/BiPAP even if this is helpful d/t his poor understanding and rationale. I encouraged [name] to speak more as a family if we should continue to aggressive (BiPAP, stepdown) vs focusing more on comfort and allowing him to pass naturally and allowing him pain and medication when needed/desired. She does agree that he is suffering. She has been thinking about if we are doing the right thing by putting him through these interventions. [name] will speak more  with her family about more comfort focused care.   Recommended a plan to not  pursue further BiPAP and allowing prn medication for comfort as needed. She seems open to this if her family agrees. This is not a new conversation to them.   Recommendations/Plan:  BiPAP as needed, continue aggressive medical management  Be cautious with sedating meds (opioids/benzos) as these seem to lead to hypercarbia  Consider haldol if QTc allows. Could also consider Zyprexa.   Would consider CPAP/BiPAP qhs but he will probably refuse (dtr/HCPOA understands and agrees)  Difficult situation as he is noncompliant but also does not understand importance of interventions  I will continue to try and support and help family  These recommendations are only if aggressive care continued to be desired.   Goals of Care and Additional Recommendations:  Limitations on Scope of Treatment: Full Scope Treatment outside DNR  Code Status:  DNR  Prognosis:   Unable to determinebut likely poor with multiple comorbitities and poor chance of improving  Discharge Planning:  Skilled Nursing Facility for rehab with Palliative care service follow-up   Thank you for allowing the Palliative Medicine Team to assist in the care of this patient.   Total Time Prolonged Time Billed  no       Greater than 50%  of this time was spent counseling and coordinating care related to the above assessment and plan.  Yong Channel, NP Palliative Medicine Team Pager # [phone] (M-F 8a-5p) Team Phone # [phone] (Nights/Weekends)

## 2017-03-06 NOTE — Progress Notes (Signed)
Patient placed back on BiPAP due to increase in WOB and RR.

## 2017-03-06 NOTE — Progress Notes (Signed)
PT Cancellation Note  Patient Details Name: ABDIEL BLACKERBY MRN: [id number] DOB: Dec 07, 1946   Cancelled Treatment:    Reason Eval/Treat Not Completed: Patient declined, no reason specified. Despite max encouragement from therapist, pt refusing multiple times to participate in any therapeutic interventions. PT will continue to f/u with pt as available and appropriate.    Alessandra Bevels Jonetta Dagley 03/06/2017, 2:06 PM

## 2017-03-06 NOTE — Progress Notes (Signed)
[facility]                                                                              Patient Demographics  Jesus [name], is a 71 y.o. male, DOB - 01-Dec-1946, ZOX:[id number]  Admit date - 02/24/2017   Admitting Physician Michael Litter, MD  Outpatient Primary MD for the patient is [name],[name], [name]  Outpatient specialists:   LOS - 10  days    Chief Complaint  Patient presents with  . Altered Mental Status       Brief summary   71 y.o.male with a history of chronic atrial fibrillation (anticoagulated with Eliquis; CHADS-Vasc score of 4), CKD 3, chronic diastolic heart failure, HTN, chronic anemia, chronic pain, chronic venous insufficiency, chronic edema, cirrhosis of the liver by CT, incidental lesions on his liver and pancreas seen on prior CT imaging. He was recently hospitalized from 1/22-2/16 for aspiration pneumonia and infected sacral decubitus ulcer requiring surgical debridement. The wound ultimately required a wound vac and he was referred to [facility]. According to his daughter, he did well there, but has declined quickly since being back at [facility] for less than one week. Patient found to have altered mental status and probable sepsis.   Assessment & Plan   Acute hypoxic and hypercarbic respiratory failure / Acute pulmonary edema - CXR on admission showed CHF with pulmonary interstitial edema and small right pleural effusion - On admission, patient was placed on BiPAP, subsequently his respiratory status was improved and patient was transferred to the floor.  - He was evaluated by SLP - recommendation was for regular diet  - On 3/27, patient was noticed to be lethargic, not following commands, had received pain medication in the morning but did not respond to Narcan. ABG showed pH of 7.166 pCO2 102, pO2 84.6. Patient was transferred back to stepdown unit and placed on BiPAP (DNR status). - May have underlying OSA or obesity hypoventilation, will need  outpatient sleep study to qualify for CPAP. - Palliative medicine also reconsulted for goals of care - Patient somewhat more lethargic today however had received Haldol and fentanyl overnight. ABG this morning did not show any hypercarbia. Willl assess if patient improves on BiPAP qhs due to underlying OSA.   Acute metabolic encephalopathy likely due to #1, underlying dementia  - Ammonia level was normal, - EEG showed mild to moderate diffuse slowing, no seizures - MRI was canceled due to patient's agitation. Neurology signed off, recommended no further work-up. D/w Dr Amada Jupiter, no need of MRI at this time as patient is close to baseline and has history of dementia. - also noted patient is on namenda, seroquel, remeron, depakote on PTA meds, I called psych consult yesterday to adjust meds    Sepsissecondary to catheter associated Providenicia stuartii UTI as well as cellulitis and superimposed infection involving chronic nonhealing sacral decubitus ulcer, both present on admission. - Urine cx grew Providencia, Blood cx so far showed no growth - Pt was on vanco and zosyn but this was narrowed down to Rocephin 3/22  Mood disorder - Continue Depakote   Chronic atrial fibrillation / Coagulopathy  - CHADS vasc score  4 - Continue anticoagulation with apixaban - Rate controlled without beta blockers  Anemia of chronic disease - Due to bone marrow suppression from history of liver cirrhosis - Has had 2 units PRBC transfusion on 3/22 - Hemoglobin stable, transfuse for hemoglobin less than 7.5  Diarrhea - C. difficile negative - No diarrhea in past   Hypokalemia - Due to sepsis, diarrhea, Currently stable  Chronic diastolic heart failure - Compensated - 2 D ECHO in 12/2014 showed EF 55-60%  Liver cirrhosis - Continue lactulose  - Continue protonix   Multiple wounds, sacral wounds, stage 4 sacral decubitus ulcer - Stage IV sacrum, partial thickness buttocks, bilateral  pretibial and calves, left lat foot, left inner thigh. Unstageable to bilateral heels, and lateral plantar surface of R foot, DTI to L medial heel - Sacrum 15cm x 18cm x 6cm deep stage 4 - Buttocks with multiple various size partial thickness wounds - Unstageable on Lateral plantar surface of R foot 1.5cm x 2cm x 0cm  - Unstageable R heel 2.5cm x 6cm x 0cm - R lateral calf 6cm x 3cm x 0.1cm partial thickness 5 - Proximal to right ankle 2.5cm x 0.5cm x 0cm dark purple.  - Two intact blisters to inner left thigh 6cm x 1.5cm and 3cm x 1cm - Left lateral calf 6cm x 3cm x 0.1cm partial thickness and one 3cm  - Left medial heel 2.5cm x 2.5cm  - Left lateral foot 4cm x 4cm and a 2cm x 4cm  - Left posterior calf with 4cm x 3cm x 0cm  - Dressing procedure/placement/frequency: NS moistened W to D dressing BID for sacral ulcer. Xeroform and foam to wounds on heels, foam to intact blisters, xeroform and gauze to partial thickness on BLE calves. Pt is already on a specialty bed. Pt already has pressure alleviating boots on.   Code Status: DNR  DVT Prophylaxis:  apixaban Family Communication: no family at bedside today.   Disposition Plan: *will need SNF.   Time Spent in minutes   25  minutes  Procedures:  BiPAP  Consultants:   Palliative care Neurology   Antimicrobials:   Rocephin   Vanco and zosyn 3/20 --> 3/22    Medications  Scheduled Meds: . sodium chloride   Intravenous Once  . apixaban  5 mg Oral BID  . cefTRIAXone (ROCEPHIN)  IV  1 g Intravenous Q24H  . chlorhexidine  15 mL Mouth Rinse BID  . divalproex  250 mg Oral Q12H  . feeding supplement (ENSURE ENLIVE)  237 mL Oral BID BM  . feeding supplement (PRO-STAT SUGAR FREE 64)  30 mL Oral BID  . lactulose  20 g Oral BID  . mouth rinse  15 mL Mouth Rinse BID  . memantine  5 mg Oral BID  . pantoprazole  40 mg Oral QHS  . sodium chloride flush  10-40 mL Intracatheter Q12H  . sodium chloride flush  3 mL Intravenous Q12H    Continuous Infusions:  PRN Meds:.acetaminophen **OR** acetaminophen, haloperidol lactate, HYDROcodone-acetaminophen, hydrocortisone cream, ondansetron **OR** ondansetron (ZOFRAN) IV, sodium chloride flush   Antibiotics   Anti-infectives    Start     Dose/Rate Route Frequency Ordered Stop   02/27/17 1400  cefTRIAXone (ROCEPHIN) 1 g in dextrose 5 % 50 mL IVPB     1 g 100 mL/hr over 30 Minutes Intravenous Every 24 hours 02/27/17 1245     02/26/17 2000  vancomycin (VANCOCIN) IVPB 1000 mg/200 mL premix  Status:  Discontinued  1,000 mg 200 mL/hr over 60 Minutes Intravenous Every 24 hours 02/26/17 0809 02/27/17 1245   02/25/17 0800  vancomycin (VANCOCIN) IVPB 1000 mg/200 mL premix  Status:  Discontinued     1,000 mg 200 mL/hr over 60 Minutes Intravenous Every 12 hours 02/24/17 2102 02/26/17 0809   02/25/17 0600  piperacillin-tazobactam (ZOSYN) IVPB 3.375 g  Status:  Discontinued     3.375 g 12.5 mL/hr over 240 Minutes Intravenous Every 8 hours 02/24/17 2102 02/27/17 1245   02/24/17 1930  vancomycin (VANCOCIN) 2,000 mg in sodium chloride 0.9 % 500 mL IVPB     2,000 mg 250 mL/hr over 120 Minutes Intravenous  Once 02/24/17 1915 02/24/17 2201   02/24/17 1915  piperacillin-tazobactam (ZOSYN) IVPB 3.375 g     3.375 g 100 mL/hr over 30 Minutes Intravenous  Once 02/24/17 1907 02/24/17 2359   02/24/17 1915  vancomycin (VANCOCIN) IVPB 1000 mg/200 mL premix  Status:  Discontinued     1,000 mg 200 mL/hr over 60 Minutes Intravenous  Once 02/24/17 1907 02/24/17 1915   02/24/17 1815  cefTRIAXone (ROCEPHIN) injection 1 g  Status:  Discontinued     1 g Intramuscular  Once 02/24/17 1801 02/24/17 1907        Subjective:   Delano Devera was seen and examined today. A little bit off today compared to yesterday oriented to self and place. Overnight needed Haldol and fentanyl. No fevers, denies any dizziness, CP, SOB, abd pain, nausea or vomiting.   Objective:   Vitals:   03/06/17 0100  03/06/17 0300 03/06/17 0712 03/06/17 0835  BP: (!) 154/76 (!) 171/88 (!) 153/79   Pulse: 88 76 84 74  Resp: (!) 34 (!) 32 (!) 76 (!) 32  Temp: 98.6 F (37 C) 98.2 F (36.8 C) 98.9 F (37.2 C)   TempSrc: Oral Oral Oral   SpO2: 90% 95% 94% 99%  Weight:  124.7 kg (275 lb)    Height:        Intake/Output Summary (Last 24 hours) at 03/06/17 1141 Last data filed at 03/06/17 0700  Gross per 24 hour  Intake             1215 ml  Output              675 ml  Net              540 ml     Wt Readings from Last 3 Encounters:  03/06/17 124.7 kg (275 lb)  02/17/17 136.1 kg (300 lb)  02/16/17 (!) 136.2 kg (300 lb 3.2 oz)     Exam  General:  Alert and oriented x 2, NAD   HEENT:   Neck: Supple, no JVD,   Cardiovascular: S1 S2 auscultated, no rubs, murmurs or gallops. Regular rate and rhythm.  Respiratory: Clear to auscultation bilaterally, no wheezing, rales or rhonchi  Gastrointestinal: Soft, nontender, nondistended, + bowel sounds  Ext: no cyanosis clubbing or edema  Neuro:   Skin: Pressure boots on bilaterally on the heels  Psych: alert and oriented, following commands   Data Reviewed:  I have personally reviewed following labs and imaging studies  Micro Results Recent Results (from the past 240 hour(s))  Culture, blood (routine x 2)     Status: None   Collection Time: 02/24/17  2:21 PM  Result Value Ref Range Status   Specimen Description BLOOD RIGHT UPPER  Final   Special Requests BOTTLES DRAWN AEROBIC ONLY 5CC  Final   Culture NO GROWTH 5  DAYS  Final   Report Status 03/01/2017 FINAL  Final  Culture, blood (routine x 2)     Status: None   Collection Time: 02/24/17  3:48 PM  Result Value Ref Range Status   Specimen Description BLOOD LEFT ANTECUBITAL  Final   Special Requests BOTTLES DRAWN AEROBIC ONLY 10CC  Final   Culture NO GROWTH 5 DAYS  Final   Report Status 03/01/2017 FINAL  Final  Urine culture     Status: Abnormal   Collection Time: 02/24/17  4:01 PM   Result Value Ref Range Status   Specimen Description URINE, CATHETERIZED  Final   Special Requests NONE  Final   Culture >=100,000 COLONIES/mL PROVIDENCIA STUARTII (A)  Final   Report Status 02/26/2017 FINAL  Final   Organism ID, Bacteria PROVIDENCIA STUARTII (A)  Final      Susceptibility   Providencia stuartii - MIC*    AMPICILLIN >=32 RESISTANT Resistant     CEFAZOLIN >=64 RESISTANT Resistant     CEFTRIAXONE <=1 SENSITIVE Sensitive     CIPROFLOXACIN 2 INTERMEDIATE Intermediate     GENTAMICIN RESISTANT Resistant     IMIPENEM 2 SENSITIVE Sensitive     NITROFURANTOIN 128 RESISTANT Resistant     TRIMETH/SULFA >=320 RESISTANT Resistant     AMPICILLIN/SULBACTAM >=32 RESISTANT Resistant     PIP/TAZO <=4 SENSITIVE Sensitive     * >=100,000 COLONIES/mL PROVIDENCIA STUARTII  MRSA PCR Screening     Status: None   Collection Time: 02/24/17  9:34 PM  Result Value Ref Range Status   MRSA by PCR NEGATIVE NEGATIVE Final    Comment:        The GeneXpert MRSA Assay (FDA approved for NASAL specimens only), is one component of a comprehensive MRSA colonization surveillance program. It is not intended to diagnose MRSA infection nor to guide or monitor treatment for MRSA infections.   C difficile quick scan w PCR reflex     Status: None   Collection Time: 02/25/17  9:53 AM  Result Value Ref Range Status   C Diff antigen NEGATIVE NEGATIVE Final   C Diff toxin NEGATIVE NEGATIVE Final   C Diff interpretation No C. difficile detected.  Final  Culture, Urine     Status: None   Collection Time: 03/04/17  9:27 PM  Result Value Ref Range Status   Specimen Description URINE, RANDOM  Final   Special Requests NONE  Final   Culture NO GROWTH  Final   Report Status 03/06/2017 FINAL  Final    Radiology Reports Dg Chest Port 1 View  Result Date: 02/26/2017 CLINICAL DATA:  PICC line. EXAM: PORTABLE CHEST 1 VIEW COMPARISON:  02/24/2017 . FINDINGS: PICC line noted with tip over the cavoatrial  junction. Cardiomegaly with pulmonary interstitial prominence. Small right pleural effusion. No pneumothorax . IMPRESSION: 1. PICC line noted with tip projected over the cavoatrial junction. 2. Congestive heart failure with pulmonary interstitial edema and small right pleural effusion . Electronically Signed   By: Maisie Fus  Register   On: 02/26/2017 11:37   Dg Chest Port 1 View  Result Date: 02/24/2017 CLINICAL DATA:  Altered mental status EXAM: PORTABLE CHEST 1 VIEW COMPARISON:  January 20, 2017 FINDINGS: There is persistent cardiomegaly. There is pulmonary venous hypertension. There is no edema or consolidation. No adenopathy. No bone lesions. IMPRESSION: Pulmonary vascular congestion without edema or consolidation. Electronically Signed   By: Bretta Bang III M.D.   On: 02/24/2017 15:08    Lab Data:  CBC:  Recent Labs  Lab 03/01/17 0400 03/02/17 0431 03/04/17 0443 03/05/17 0413 03/06/17 0608  WBC 6.5 6.8 8.8 7.3 6.0  HGB 7.9* 8.4* 8.5* 8.4* 8.0*  HCT 27.0* 28.1* 29.0* 28.1* 26.9*  MCV 95.4 95.3 99.3 95.3 95.4  PLT 248 260 271 264 259   Basic Metabolic Panel:  Recent Labs Lab 03/01/17 0400 03/02/17 0431 03/04/17 0443 03/05/17 0413 03/06/17 0608  NA 138 140 143 142 141  K 3.8 3.8 4.6 4.0 3.5  CL 101 99* 101 102 101  CO2 32 33* 35* 32 34*  GLUCOSE 89 95 82 100* 117*  BUN CREATININE 0.87 0.84 0.81 0.92 0.83  CALCIUM 8.5* 8.7* 9.2 9.2 9.0   GFR: Estimated Creatinine Clearance: 109.8 mL/min (by C-G formula based on SCr of 0.83 mg/dL). Liver Function Tests:  Recent Labs Lab 02/28/17 0410  AST 15  ALT 11*  ALKPHOS 126  BILITOT 1.0  PROT 5.4*  ALBUMIN 1.6*   No results for input(s): LIPASE, AMYLASE in the last 168 hours.  Recent Labs Lab 02/27/17 1447 03/03/17 1800  AMMONIA 51* 27   Coagulation Profile: No results for input(s): INR, PROTIME in the last 168 hours. Cardiac Enzymes: No results for input(s): CKTOTAL, CKMB, CKMBINDEX,  TROPONINI in the last 168 hours. BNP (last 3 results) No results for input(s): PROBNP in the last 8760 hours. HbA1C: No results for input(s): HGBA1C in the last 72 hours. CBG:  Recent Labs Lab 03/02/17 1649  GLUCAP 129*   Lipid Profile: No results for input(s): CHOL, HDL, LDLCALC, TRIG, CHOLHDL, LDLDIRECT in the last 72 hours. Thyroid Function Tests: No results for input(s): TSH, T4TOTAL, FREET4, T3FREE, THYROIDAB in the last 72 hours. Anemia Panel: No results for input(s): VITAMINB12, FOLATE, FERRITIN, TIBC, IRON, RETICCTPCT in the last 72 hours. Urine analysis:    Component Value Date/Time   COLORURINE AMBER (A) 03/04/2017 2127   APPEARANCEUR HAZY (A) 03/04/2017 2127   LABSPEC 1.018 03/04/2017 2127   PHURINE 6.0 03/04/2017 2127   GLUCOSEU NEGATIVE 03/04/2017 2127   HGBUR MODERATE (A) 03/04/2017 2127   BILIRUBINUR NEGATIVE 03/04/2017 2127   KETONESUR 20 (A) 03/04/2017 2127   PROTEINUR 100 (A) 03/04/2017 2127   UROBILINOGEN 4.0 (H) 06/02/2012 1200   NITRITE NEGATIVE 03/04/2017 2127   LEUKOCYTESUR SMALL (A) 03/04/2017 2127     RAI,RIPUDEEP M.D. [facility] 03/06/2017, 11:41 AM  Pager: [phone] Between 7am to 7pm - call Pager - [phone]  After 7pm go to [email or web address] - password [email or web address]  Call night coverage person covering after 7pm

## 2017-03-07 ENCOUNTER — Inpatient Hospital Stay (HOSPITAL_COMMUNITY): Payer: Medicare HMO

## 2017-03-07 LAB — CBC
HCT: 25.5 % — ABNORMAL LOW (ref 39.0–52.0)
HEMOGLOBIN: 7.5 g/dL — AB (ref 13.0–17.0)
MCH: 28.3 pg (ref 26.0–34.0)
MCHC: 29.4 g/dL — AB (ref 30.0–36.0)
MCV: 96.2 fL (ref 78.0–100.0)
PLATELETS: 274 10*3/uL (ref 150–400)
RBC: 2.65 MIL/uL — AB (ref 4.22–5.81)
RDW: 17.1 % — AB (ref 11.5–15.5)
WBC: 5.5 10*3/uL (ref 4.0–10.5)

## 2017-03-07 LAB — BLOOD GAS, ARTERIAL
Acid-Base Excess: 8.1 mmol/L — ABNORMAL HIGH (ref 0.0–2.0)
Bicarbonate: 32.3 mmol/L — ABNORMAL HIGH (ref 20.0–28.0)
Drawn by: 244851
O2 Saturation: 94.4 %
PCO2 ART: 46 mmHg (ref 32.0–48.0)
PH ART: 7.458 — AB (ref 7.350–7.450)
PO2 ART: 68.5 mmHg — AB (ref 83.0–108.0)
Patient temperature: 97.6

## 2017-03-07 LAB — POCT I-STAT 3, ART BLOOD GAS (G3+)
Acid-Base Excess: 9 mmol/L — ABNORMAL HIGH (ref 0.0–2.0)
BICARBONATE: 31.2 mmol/L — AB (ref 20.0–28.0)
O2 Saturation: 99 %
PH ART: 7.613 — AB (ref 7.350–7.450)
TCO2: 32 mmol/L (ref 0–100)
pCO2 arterial: 30.8 mmHg — ABNORMAL LOW (ref 32.0–48.0)
pO2, Arterial: 96 mmHg (ref 83.0–108.0)

## 2017-03-07 LAB — AMMONIA: AMMONIA: 20 umol/L (ref 9–35)

## 2017-03-07 LAB — BASIC METABOLIC PANEL
Anion gap: 7 (ref 5–15)
BUN: 11 mg/dL (ref 6–20)
CALCIUM: 9 mg/dL (ref 8.9–10.3)
CO2: 34 mmol/L — ABNORMAL HIGH (ref 22–32)
CREATININE: 0.79 mg/dL (ref 0.61–1.24)
Chloride: 101 mmol/L (ref 101–111)
GFR calc Af Amer: >60 mL/min (ref >60)
Glucose, Bld: 101 mg/dL — ABNORMAL HIGH (ref 65–99)
Potassium: 3.7 mmol/L (ref 3.5–5.1)
SODIUM: 142 mmol/L (ref 135–145)

## 2017-03-07 LAB — PROCALCITONIN: PROCALCITONIN: 0.14 ng/mL

## 2017-03-07 MED ORDER — FUROSEMIDE 10 MG/ML IJ SOLN
40.0000 mg | Freq: Two times a day (BID) | INTRAMUSCULAR | Status: DC
  Administered 2017-03-07: 40 mg via INTRAVENOUS
  Filled 2017-03-07: qty 4

## 2017-03-07 NOTE — Progress Notes (Addendum)
[facility]                                                                              Patient Demographics  Jesus [name], is a 71 y.o. male, DOB - 19-Dec-1945, ZOX:[id number]  Admit date - 02/24/2017   Admitting Physician Michael Litter, MD  Outpatient Primary MD for the patient is [name],[name], [name]  Outpatient specialists:   LOS - 11  days    Chief Complaint  Patient presents with  . Altered Mental Status       Brief summary   70 y.o.male with a history of chronic atrial fibrillation (anticoagulated with Eliquis; CHADS-Vasc score of 4), CKD 3, chronic diastolic heart failure, HTN, chronic anemia, chronic pain, chronic venous insufficiency, chronic edema, cirrhosis of the liver by CT, incidental lesions on his liver and pancreas seen on prior CT imaging. He was recently hospitalized from 1/22-2/16 for aspiration pneumonia and infected sacral decubitus ulcer requiring surgical debridement. The wound ultimately required a wound vac and he was referred to [facility]. According to his daughter, he did well there, but has declined quickly since being back at [facility] for less than one week. Patient found to have altered mental status and probable sepsis.   Assessment & Plan   Acute hypoxic and hypercarbic respiratory failure / Acute pulmonary edema - CXR on admission showed CHF with pulmonary interstitial edema and small right pleural effusion - On admission, patient was placed on BiPAP, subsequently his respiratory status was improved and patient was transferred to the floor.  - He was evaluated by SLP - recommendation was for regular diet  - On 3/27, patient was noticed to be lethargic, not following commands, had received pain medication in the morning but did not respond to Narcan. ABG showed pH of 7.166 pCO2 102, pO2 84.6. Patient was transferred back to stepdown unit and placed on BiPAP (DNR status). - May have underlying OSA or obesity hypoventilation, will need  outpatient sleep study to qualify for CPAP. - Palliative medicine also reconsulted for goals of care - Patient was placed on BiPAP yesterday again for hypoxic respiratory failure, overnight has remained on BiPAP, today lethargic, will repeat ABG and ammonia level. I will request pulmonary critical care to evaluate for any recommendations, discussed with Dr. Marchelle Gearing. Patient has been back and forth on BiPAP multiple times with no significant improvement overall. Palliative medicine as also working with family regarding goals of care.   Acute metabolic encephalopathy likely due to #1, underlying dementia  - Ammonia level was normal, - EEG showed mild to moderate diffuse slowing, no seizures - MRI was canceled due to patient's agitation. Neurology signed off, recommended no further work-up. - Now back on BiPAP and unstable to get an MRI  Sepsissecondary to catheter associated Providenicia stuartii UTI as well as cellulitis and superimposed infection involving chronic nonhealing sacral decubitus ulcer, both present on admission. - Urine cx grew Providencia, Blood cx so far showed no growth - Pt was on vanco and zosyn but this was narrowed down to Rocephin 3/22 - Sepsis physiology has resolved, will discontinue antibiotics after tomorrow's dose.   Mood disorder - Continue Depakote  Chronic atrial fibrillation / Coagulopathy  - CHADS vasc score 4 - Continue anticoagulation with apixaban - Rate controlled without beta blockers  Anemia of chronic disease - Due to bone marrow suppression from history of liver cirrhosis - Has had 2 units PRBC transfusion on 3/22 - transfuse for hemoglobin less than 7.5  Diarrhea - C. difficile negative - No diarrhea in past   Hypokalemia - Due to sepsis, diarrhea, Currently stable  Chronic diastolic heart failure - Compensated - 2 D ECHO in 12/2014 showed EF 55-60%  Liver cirrhosis - Continue lactulose  - Continue protonix   Multiple  wounds, sacral wounds, stage 4 sacral decubitus ulcer - Stage IV sacrum, partial thickness buttocks, bilateral pretibial and calves, left lat foot, left inner thigh. Unstageable to bilateral heels, and lateral plantar surface of R foot, DTI to L medial heel - Sacrum 15cm x 18cm x 6cm deep stage 4 - Buttocks with multiple various size partial thickness wounds - Unstageable on Lateral plantar surface of R foot 1.5cm x 2cm x 0cm  - Unstageable R heel 2.5cm x 6cm x 0cm - R lateral calf 6cm x 3cm x 0.1cm partial thickness 5 - Proximal to right ankle 2.5cm x 0.5cm x 0cm dark purple.  - Two intact blisters to inner left thigh 6cm x 1.5cm and 3cm x 1cm - Left lateral calf 6cm x 3cm x 0.1cm partial thickness and one 3cm  - Left medial heel 2.5cm x 2.5cm  - Left lateral foot 4cm x 4cm and a 2cm x 4cm  - Left posterior calf with 4cm x 3cm x 0cm  - Dressing procedure/placement/frequency: NS moistened W to D dressing BID for sacral ulcer. Xeroform and foam to wounds on heels, foam to intact blisters, xeroform and gauze to partial thickness on BLE calves. Pt is already on a specialty bed. Pt already has pressure alleviating boots on.   Code Status: DNR  DVT Prophylaxis:  apixaban Family Communication: no family at bedside today.   Disposition Plan: *will need SNF.   Time Spent in minutes   25  minutes  Procedures:  BiPAP  Consultants:   Palliative care Neurology   Antimicrobials:   Rocephin 3/22->   Vanco and zosyn 3/20 --> 3/22    Medications  Scheduled Meds: . sodium chloride   Intravenous Once  . apixaban  5 mg Oral BID  . cefTRIAXone (ROCEPHIN)  IV  1 g Intravenous Q24H  . chlorhexidine  15 mL Mouth Rinse BID  . divalproex  250 mg Oral Q12H  . feeding supplement (ENSURE ENLIVE)  237 mL Oral BID BM  . feeding supplement (PRO-STAT SUGAR FREE 64)  30 mL Oral BID  . lactulose  20 g Oral BID  . mouth rinse  15 mL Mouth Rinse BID  . memantine  5 mg Oral BID  . pantoprazole  40  mg Oral QHS  . sodium chloride flush  10-40 mL Intracatheter Q12H  . sodium chloride flush  3 mL Intravenous Q12H   Continuous Infusions: . dextrose 5 % and 0.45% NaCl 75 mL/hr at 03/07/17 0721   PRN Meds:.acetaminophen **OR** acetaminophen, haloperidol lactate, HYDROcodone-acetaminophen, hydrocortisone cream, ondansetron **OR** ondansetron (ZOFRAN) IV, sodium chloride flush   Antibiotics   Anti-infectives    Start     Dose/Rate Route Frequency Ordered Stop   02/27/17 1400  cefTRIAXone (ROCEPHIN) 1 g in dextrose 5 % 50 mL IVPB     1 g 100 mL/hr over 30 Minutes Intravenous Every 24 hours 02/27/17  1245     02/26/17 2000  vancomycin (VANCOCIN) IVPB 1000 mg/200 mL premix  Status:  Discontinued     1,000 mg 200 mL/hr over 60 Minutes Intravenous Every 24 hours 02/26/17 0809 02/27/17 1245   02/25/17 0800  vancomycin (VANCOCIN) IVPB 1000 mg/200 mL premix  Status:  Discontinued     1,000 mg 200 mL/hr over 60 Minutes Intravenous Every 12 hours 02/24/17 2102 02/26/17 0809   02/25/17 0600  piperacillin-tazobactam (ZOSYN) IVPB 3.375 g  Status:  Discontinued     3.375 g 12.5 mL/hr over 240 Minutes Intravenous Every 8 hours 02/24/17 2102 02/27/17 1245   02/24/17 1930  vancomycin (VANCOCIN) 2,000 mg in sodium chloride 0.9 % 500 mL IVPB     2,000 mg 250 mL/hr over 120 Minutes Intravenous  Once 02/24/17 1915 02/24/17 2201   02/24/17 1915  piperacillin-tazobactam (ZOSYN) IVPB 3.375 g     3.375 g 100 mL/hr over 30 Minutes Intravenous  Once 02/24/17 1907 02/24/17 2359   02/24/17 1915  vancomycin (VANCOCIN) IVPB 1000 mg/200 mL premix  Status:  Discontinued     1,000 mg 200 mL/hr over 60 Minutes Intravenous  Once 02/24/17 1907 02/24/17 1915   02/24/17 1815  cefTRIAXone (ROCEPHIN) injection 1 g  Status:  Discontinued     1 g Intramuscular  Once 02/24/17 1801 02/24/17 1907        Subjective:   Jesus [name] was seen and examined today. Lethargic but opens eyes to verbal commands, back on BiPAP  since yesterday. Unable to obtain any review of system from the patient. No fevers.  Objective:   Vitals:   03/06/17 2345 03/07/17 0300 03/07/17 0728 03/07/17 0951  BP: 121/67  119/61   Pulse: 83 65 (!) 58 63  Resp: (!) Temp: 98.3 F (36.8 C) 98.5 F (36.9 C) 98.3 F (36.8 C)   TempSrc: Axillary Axillary Axillary   SpO2: 99%  100% 100%  Weight:      Height:        Intake/Output Summary (Last 24 hours) at 03/07/17 1109 Last data filed at 03/07/17 0600  Gross per 24 hour  Intake          1043.75 ml  Output              500 ml  Net           543.75 ml     Wt Readings from Last 3 Encounters:  03/06/17 124.7 kg (275 lb)  02/17/17 136.1 kg (300 lb)  02/16/17 (!) 136.2 kg (300 lb 3.2 oz)     Exam  General: Lethargic, on BiPAP, opens eyes to verbal commands  HEENT:   Neck: Supple, no JVD,   Cardiovascular: S1 S2 auscultated, no rubs, murmurs or gallops. Regular rate and rhythm.  Respiratory: Clear to auscultation bilaterally anteriorly  Gastrointestinal: Soft, nontender, nondistended, + bowel sounds  Ext: no cyanosis clubbing or edema  Neuro:   Skin: Pressure boots on bilaterally on the heels  Psych: lethargic on BiPAP   Data Reviewed:  I have personally reviewed following labs and imaging studies  Micro Results Recent Results (from the past 240 hour(s))  Culture, Urine     Status: None   Collection Time: 03/04/17  9:27 PM  Result Value Ref Range Status   Specimen Description URINE, RANDOM  Final   Special Requests NONE  Final   Culture NO GROWTH  Final   Report Status 03/06/2017 FINAL  Final  Radiology Reports Dg Chest Port 1 View  Result Date: 02/26/2017 CLINICAL DATA:  PICC line. EXAM: PORTABLE CHEST 1 VIEW COMPARISON:  02/24/2017 . FINDINGS: PICC line noted with tip over the cavoatrial junction. Cardiomegaly with pulmonary interstitial prominence. Small right pleural effusion. No pneumothorax . IMPRESSION: 1. PICC line noted with  tip projected over the cavoatrial junction. 2. Congestive heart failure with pulmonary interstitial edema and small right pleural effusion . Electronically Signed   By: Maisie Fus  Register   On: 02/26/2017 11:37   Dg Chest Port 1 View  Result Date: 02/24/2017 CLINICAL DATA:  Altered mental status EXAM: PORTABLE CHEST 1 VIEW COMPARISON:  January 20, 2017 FINDINGS: There is persistent cardiomegaly. There is pulmonary venous hypertension. There is no edema or consolidation. No adenopathy. No bone lesions. IMPRESSION: Pulmonary vascular congestion without edema or consolidation. Electronically Signed   By: Bretta Bang III M.D.   On: 02/24/2017 15:08    Lab Data:  CBC:  Recent Labs Lab 03/02/17 0431 03/04/17 0443 03/05/17 0413 03/06/17 0608 03/07/17 0352  WBC 6.8 8.8 7.3 6.0 5.5  HGB 8.4* 8.5* 8.4* 8.0* 7.5*  HCT 28.1* 29.0* 28.1* 26.9* 25.5*  MCV 95.3 99.3 95.3 95.4 96.2  PLT 260 271 264 259 274   Basic Metabolic Panel:  Recent Labs Lab 03/02/17 0431 03/04/17 0443 03/05/17 0413 03/06/17 0608 03/07/17 0352  NA 140 143 142 141 142  K 3.8 4.6 4.0 3.5 3.7  CL 99* 101 102 101 101  CO2 33* 35* 32 34* 34*  GLUCOSE 95 82 100* 117* 101*  BUN CREATININE 0.84 0.81 0.92 0.83 0.79  CALCIUM 8.7* 9.2 9.2 9.0 9.0   GFR: Estimated Creatinine Clearance: 113.9 mL/min (by C-G formula based on SCr of 0.79 mg/dL). Liver Function Tests: No results for input(s): AST, ALT, ALKPHOS, BILITOT, PROT, ALBUMIN in the last 168 hours. No results for input(s): LIPASE, AMYLASE in the last 168 hours.  Recent Labs Lab 03/03/17 1800  AMMONIA 27   Coagulation Profile: No results for input(s): INR, PROTIME in the last 168 hours. Cardiac Enzymes: No results for input(s): CKTOTAL, CKMB, CKMBINDEX, TROPONINI in the last 168 hours. BNP (last 3 results) No results for input(s): PROBNP in the last 8760 hours. HbA1C: No results for input(s): HGBA1C in the last 72 hours. CBG:  Recent  Labs Lab 03/02/17 1649  GLUCAP 129*   Lipid Profile: No results for input(s): CHOL, HDL, LDLCALC, TRIG, CHOLHDL, LDLDIRECT in the last 72 hours. Thyroid Function Tests: No results for input(s): TSH, T4TOTAL, FREET4, T3FREE, THYROIDAB in the last 72 hours. Anemia Panel: No results for input(s): VITAMINB12, FOLATE, FERRITIN, TIBC, IRON, RETICCTPCT in the last 72 hours. Urine analysis:    Component Value Date/Time   COLORURINE AMBER (A) 03/04/2017 2127   APPEARANCEUR HAZY (A) 03/04/2017 2127   LABSPEC 1.018 03/04/2017 2127   PHURINE 6.0 03/04/2017 2127   GLUCOSEU NEGATIVE 03/04/2017 2127   HGBUR MODERATE (A) 03/04/2017 2127   BILIRUBINUR NEGATIVE 03/04/2017 2127   KETONESUR 20 (A) 03/04/2017 2127   PROTEINUR 100 (A) 03/04/2017 2127   UROBILINOGEN 4.0 (H) 06/02/2012 1200   NITRITE NEGATIVE 03/04/2017 2127   LEUKOCYTESUR SMALL (A) 03/04/2017 2127     RAI,RIPUDEEP M.D. [facility] 03/07/2017, 11:09 AM  Pager: [phone] Between 7am to 7pm - call Pager - [phone]  After 7pm go to [email or web address] - password [email or web address]  Call night coverage person covering after 7pm

## 2017-03-07 NOTE — Progress Notes (Signed)
Came to assess patient. Pt is not in the room at this time. Pt is off the floor

## 2017-03-07 NOTE — Consult Note (Signed)
PULMONARY / CRITICAL CARE MEDICINE   Name: Jesus [name] MRN: [id number] DOB: 07-Jan-1946    ADMISSION DATE:  02/24/2017 CONSULTATION DATE:  3/31  REFERRING MD:  [facility]  CHIEF COMPLAINT:  ams/FTT  HISTORY OF PRESENT ILLNESS:   MO (275 lbs) WM with general FTT for several months, non ambulatory, recent admit for decubitus ulcers with surgical interventions. Transferred to [facility] then to SNF and back to [facility] 3/20 with hypoxia and AMS. He has Afib on eliquis, chf with last ef 55% , remains confused, and is now bipap dependent at night. He is a DNR. Continue Bipap, I doubt he will survie this hospitalization. PCCM will follow.   PAST MEDICAL HISTORY :  He  has a past medical history of Acute on chronic renal failure (HCC); Arthritis; Cellulitis and abscess of leg (11/2016); Chronic anemia; Chronic atrial fibrillation (HCC); Chronic diastolic CHF (congestive heart failure) (HCC) (12/2002); Decubitus ulcer of buttock, unstageable (HCC) (12/31/2016); Edema of lower extremity; Gout; H/O: GI bleed; High cholesterol; Hyperglycemia; Hypertension; Morbid obesity (HCC); PVD (peripheral vascular disease) (HCC); and Venous stasis ulcer (HCC).  PAST SURGICAL HISTORY: He  has a past surgical history that includes No past surgeries and Debridment of decubitus ulcer (N/A, 01/02/2017).  Allergies  Allergen Reactions  . Amlodipine Besylate Swelling and Other (See Comments)    "started off low and ended up severe; if, in fact, that is what's causing the swelling" (06/03/12)  . Levaquin [Levofloxacin In D5w] Rash    No current facility-administered medications on file prior to encounter.    Current Outpatient Prescriptions on File Prior to Encounter  Medication Sig  . acetaminophen (TYLENOL) 500 MG tablet Take 500 mg by mouth every 8 (eight) hours as needed for moderate pain (Pain score 4-6/10).  . Amino Acids-Protein Hydrolys (FEEDING SUPPLEMENT, PRO-STAT SUGAR FREE 64,) LIQD Take 30 mLs by mouth every 6  (six) hours.  Marland Kitchen apixaban (ELIQUIS) 5 MG TABS tablet Take 1 tablet (5 mg total) by mouth 2 (two) times daily.  Marland Kitchen atorvastatin (LIPITOR) 80 MG tablet Take 1 tablet (80 mg total) by mouth daily.  . carvedilol (COREG) 12.5 MG tablet Take 12.5 mg by mouth 2 (two) times daily with a meal.  . diphenhydrAMINE (BENADRYL) 25 MG tablet Take 25 mg by mouth every 6 (six) hours as needed for allergies.  Marland Kitchen divalproex (DEPAKOTE ER) 250 MG 24 hr tablet Take 250 mg by mouth every morning.   . divalproex (DEPAKOTE SPRINKLE) 125 MG capsule Take 125 mg by mouth at bedtime.  . famotidine (PEPCID) 20 MG tablet Take 20 mg by mouth 2 (two) times daily.  Marland Kitchen HYDROcodone-acetaminophen (NORCO/VICODIN) 5-325 MG tablet Take 1 tablet by mouth every 6 (six) hours as needed for moderate pain.  Marland Kitchen HYDROmorphone (DILAUDID) 2 MG tablet Take 2 mg by mouth daily as needed for severe pain. For dressing changes  . lactulose (CHRONULAC) 10 GM/15ML solution Take 20 g by mouth every other day. Give 30 mL   . memantine (NAMENDA) 5 MG tablet Take 5 mg by mouth 2 (two) times daily.  . mirtazapine (REMERON) 7.5 MG tablet Take 7.5 mg by mouth at bedtime.   . Multiple Vitamin (MULTIVITAMIN WITH MINERALS) TABS tablet Take 1 tablet by mouth daily.  Marland Kitchen OVER THE COUNTER MEDICATION Take 1 Container by mouth 3 (three) times daily with meals. MAGIC CUP  . saccharomyces boulardii (FLORASTOR) 250 MG capsule Take 250 mg by mouth 2 (two) times daily.   Marland Kitchen thiamine 100 MG tablet Take 1  tablet (100 mg total) by mouth daily.  Marland Kitchen torsemide (DEMADEX) 20 MG tablet Take 1 tablet (20 mg total) by mouth daily.  . Vitamins A & D (VITAMIN A & D) ointment Apply 1 application topically 2 (two) times daily.    FAMILY HISTORY:  His indicated that his mother is deceased. He indicated that his father is deceased.    SOCIAL HISTORY: He  reports that he quit smoking about 51 years ago. His smoking use included Cigarettes. He has never used smokeless tobacco. He reports that  he drinks about 8.4 oz of alcohol per week . He reports that he does not use drugs.  REVIEW OF SYSTEMS:   na  SUBJECTIVE:  NAD  VITAL SIGNS: BP (!) 148/92 (BP Location: Left Arm)   Pulse 69   Temp 97.6 F (36.4 C) (Oral)   Resp (!) 24   Ht  (1.778 m)   Wt 124.7 kg (275 lb)   SpO2 96%   BMI 39.46 kg/m   HEMODYNAMICS:    VENTILATOR SETTINGS: FiO2 (%):  [30 %] 30 %  INTAKE / OUTPUT: I/O last 3 completed shifts: In: 2183.8 [P.O.:240; I.V.:1743.8; IV Piggyback:200] Out: 975 [Urine:975]  PHYSICAL EXAMINATION: General: Obese pleasantly confused male Neuro:  Confused  HEENT:  NO neck Cardiovascular: HSD Lungs:  Decreased bs bases Abdomen:  Obese +bs Musculoskeletal:  Intact, ++edema Skin: WDI  LABS:  BMET  Recent Labs Lab 03/05/17 0413 03/06/17 0608 03/07/17 0352  NA 142 141 142  K 4.0 3.5 3.7  CL 102 101 101  CO2 32 34* 34*  BUN CREATININE 0.92 0.83 0.79  GLUCOSE 100* 117* 101*    Electrolytes  Recent Labs Lab 03/05/17 0413 03/06/17 0608 03/07/17 0352  CALCIUM 9.2 9.0 9.0    CBC  Recent Labs Lab 03/05/17 0413 03/06/17 0608 03/07/17 0352  WBC 7.3 6.0 5.5  HGB 8.4* 8.0* 7.5*  HCT 28.1* 26.9* 25.5*  PLT 264 259 274    Coag's No results for input(s): APTT, INR in the last 168 hours.  Sepsis Markers No results for input(s): LATICACIDVEN, PROCALCITON, O2SATVEN in the last 168 hours.  ABG  Recent Labs Lab 03/04/17 1135 03/06/17 0830 03/07/17 1100  PHART 7.535* 7.462* 7.458*  PCO2ART 38.3 46.8 46.0  PO2ART 63.6* 71.9* 68.5*    Liver Enzymes No results for input(s): AST, ALT, ALKPHOS, BILITOT, ALBUMIN in the last 168 hours.  Cardiac Enzymes No results for input(s): TROPONINI, PROBNP in the last 168 hours.  Glucose  Recent Labs Lab 03/02/17 1649  GLUCAP 129*    Imaging No results found.   STUDIES:    CULTURES: As noted  ANTIBIOTICS: 3/23 roc>>  SIGNIFICANT  EVENTS:   LINES/TUBES:   DISCUSSION: MO (275 lbs) WM with general FTT for several months, non ambulatory, recent admit for decubitus ulcers with surgical interventions. Transferred to [facility] then to SNF and back to [facility] 3/20 with hypoxia and AMS. He has Afib on eliquis, chf with last ef 55% , remains confused, and is now bipap dependent at night. He is a DNR. Continue Bipap, I doubt he will survie this hospitalization. PCCM will follow.  ASSESSMENT / PLAN:  PULMONARY A: Suspected OSA Hx of Pna P:   Nocturnal bipap Check cx r  CARDIOVASCULAR A:  CAF on eliquis CHF last ef 55% in 2016 P:  Diuresis as tolerated anticoagulation  RENAL Lab Results  Component Value Date   CREATININE 0.79 03/07/2017   CREATININE 0.83 03/06/2017  CREATININE 0.92 03/05/2017    Recent Labs Lab 03/05/17 0413 03/06/17 0608 03/07/17 0352  K 4.0 3.5 3.7     A:   No acute issue P:   Follow labs  GASTROINTESTINAL A:   Cirrhosis , liver lesion P:   PPI Follow LFT  HEMATOLOGIC  Recent Labs  03/06/17 0608 03/07/17 0352  HGB 8.0* 7.5*    A:   Anemia P:  Transfuse for hgb<7  INFECTIOUS A:   Chronic decub infection P:   Roc3/23>> 3/28 uc >>neg 3/20 uc>>providencia stuartii ss roc   ENDOCRINE  A:   No acute issues  P:   No TSH since 2017  NEUROLOGIC A:   Decreased LOC Helayne [place]in[place] has evaluated ?dementia vs metabolic encephalopathy  P:   RASS goal: o Minimize sedation    FAMILY  - Updates: Sister updated at bedside  - Inter-disciplinary family meet or Palliative Care meeting due by: He has been followed by palliative care and family has made him dnr but continue aggressive care.    App cct 59 min   Brett Canales Corrie Reder ACNP [facility] PCCM Pager [phone] till 3 pm If no answer page [phone] 03/07/2017, 2:31 PM

## 2017-03-08 DIAGNOSIS — J9621 Acute and chronic respiratory failure with hypoxia: Secondary | ICD-10-CM

## 2017-03-08 DIAGNOSIS — J9622 Acute and chronic respiratory failure with hypercapnia: Secondary | ICD-10-CM

## 2017-03-08 LAB — CBC
HCT: 24.2 % — ABNORMAL LOW (ref 39.0–52.0)
Hemoglobin: 7.4 g/dL — ABNORMAL LOW (ref 13.0–17.0)
MCH: 29.1 pg (ref 26.0–34.0)
MCHC: 30.6 g/dL (ref 30.0–36.0)
MCV: 95.3 fL (ref 78.0–100.0)
PLATELETS: 244 10*3/uL (ref 150–400)
RBC: 2.54 MIL/uL — AB (ref 4.22–5.81)
RDW: 17.1 % — AB (ref 11.5–15.5)
WBC: 4.9 10*3/uL (ref 4.0–10.5)

## 2017-03-08 LAB — BASIC METABOLIC PANEL
ANION GAP: 7 (ref 5–15)
BUN: 12 mg/dL (ref 6–20)
CALCIUM: 8.6 mg/dL — AB (ref 8.9–10.3)
CO2: 35 mmol/L — ABNORMAL HIGH (ref 22–32)
Chloride: 95 mmol/L — ABNORMAL LOW (ref 101–111)
Creatinine, Ser: 0.77 mg/dL (ref 0.61–1.24)
GLUCOSE: 85 mg/dL (ref 65–99)
POTASSIUM: 3.5 mmol/L (ref 3.5–5.1)
SODIUM: 137 mmol/L (ref 135–145)

## 2017-03-08 LAB — LACTIC ACID, PLASMA: LACTIC ACID, VENOUS: 1.6 mmol/L (ref 0.5–1.9)

## 2017-03-08 LAB — PHOSPHORUS: PHOSPHORUS: 3.5 mg/dL (ref 2.5–4.6)

## 2017-03-08 LAB — MAGNESIUM: MAGNESIUM: 2 mg/dL (ref 1.7–2.4)

## 2017-03-08 LAB — PROCALCITONIN: PROCALCITONIN: 0.12 ng/mL

## 2017-03-08 MED ORDER — FUROSEMIDE 10 MG/ML IJ SOLN
40.0000 mg | Freq: Three times a day (TID) | INTRAMUSCULAR | Status: DC
  Administered 2017-03-08 – 2017-03-11 (×10): 40 mg via INTRAVENOUS
  Filled 2017-03-08 (×10): qty 4

## 2017-03-08 NOTE — Progress Notes (Signed)
PULMONARY / CRITICAL CARE MEDICINE   Name: Jesus [name] MRN: [id number] DOB: November 11, 1946    ADMISSION DATE:  02/24/2017 CONSULTATION DATE:  3/31  REFERRING MD:  [facility]  CHIEF COMPLAINT:  ams/FTT  HISTORY OF PRESENT ILLNESS:   MO (275 lbs) WM with general FTT for several months, non ambulatory, recent admit for decubitus ulcers with surgical interventions. Transferred to [facility] then to SNF and back to [facility] 3/20 with hypoxia and AMS. He has Afib on eliquis, chf with last ef 55% , remains confused, and is now bipap dependent at night. He is a DNR. Continue Bipap, I doubt he will survie this hospitalization. PCCM will follow.    na  SUBJECTIVE:  NAD on room air  VITAL SIGNS: BP (!) 117/58   Pulse 71   Temp 98.4 F (36.9 C) (Axillary)   Resp (!) 27   Ht  (1.778 m)   Wt 124.7 kg (275 lb)   SpO2 100%   BMI 39.46 kg/m   HEMODYNAMICS:    VENTILATOR SETTINGS:    INTAKE / OUTPUT: I/O last 3 completed shifts: In: 825 [I.V.:825] Out: 2800 [Urine:2800]  PHYSICAL EXAMINATION: General: Obese pleasantly confused male,off o2 , eating Neuro:  Confused but pleasant HEENT:  No neck Cardiovascular: HSD Lungs:  Decreased bs bases, no crackles Abdomen:  Obese +bs Musculoskeletal:  Intact, +edema Skin: WDI  LABS:  BMET  Recent Labs Lab 03/06/17 0608 03/07/17 0352 03/08/17 0533  NA 141 142 137  K 3.5 3.7 3.5  CL 101 101 95*  CO2 34* 34* 35*  BUN CREATININE 0.83 0.79 0.77  GLUCOSE 117* 101* 85    Electrolytes  Recent Labs Lab 03/06/17 0608 03/07/17 0352 03/08/17 0533  CALCIUM 9.0 9.0 8.6*  MG  --   --  2.0  PHOS  --   --  3.5    CBC  Recent Labs Lab 03/06/17 0608 03/07/17 0352 03/08/17 0533  WBC 6.0 5.5 4.9  HGB 8.0* 7.5* 7.4*  HCT 26.9* 25.5* 24.2*  PLT 259 274 244    Coag's No results for input(s): APTT, INR in the last 168 hours.  Sepsis Markers  Recent Labs Lab 03/07/17 1815 03/08/17 0533  LATICACIDVEN  --  1.6   PROCALCITON 0.14 0.12    ABG  Recent Labs Lab 03/06/17 0830 03/07/17 1100 03/07/17 1135  PHART 7.462* 7.458* 7.613*  PCO2ART 46.8 46.0 30.8*  PO2ART 71.9* 68.5* 96.0    Liver Enzymes No results for input(s): AST, ALT, ALKPHOS, BILITOT, ALBUMIN in the last 168 hours.  Cardiac Enzymes No results for input(s): TROPONINI, PROBNP in the last 168 hours.  Glucose  Recent Labs Lab 03/02/17 1649  GLUCAP 129*    Imaging Ct Chest Wo Contrast  Result Date: 03/07/2017 CLINICAL DATA:  Acute on chronic respiratory failure. Hypoxia and hypercapnia. History of CHF, atrial fibrillation, pulmonary vascular congestion, sepsis. EXAM: CT CHEST WITHOUT CONTRAST TECHNIQUE: Multidetector CT imaging of the chest was performed following the standard protocol without IV contrast. COMPARISON:  Chest radiograph March 07, 2017 at 1523 hours FINDINGS: Cardiovascular: The heart is moderately enlarged. No pericardial effusions. Severe coronary artery calcifications. Ascending aorta is 4.3 by 4.7 cm in transaxial dimension with moderate calcific atherosclerosis. RIGHT PICC distal tip in the upper superior vena cava. Mediastinum/Nodes: No lymphadenopathy by CT size criteria though sensitivity decreased without intravenous contrast. Lungs/Pleura: Small to moderate RIGHT and small LEFT layering pleural effusions with underlying consolidation. Scattered calcified granulomata. Patchy to RIGHT middle  lobe, RIGHT lower lobe consolidation with air bronchograms. Heterogeneous lung attenuation. No pulmonary masses, limited assessment for pulmonary nodule due to respiratory motion and habitus. Upper Abdomen: Nonacute. Streak artifact through upper abdomen, patient was scanned with arms at side. 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 Old RIGHT rib fractures. Moderate degenerative change of thoracic spine. IMPRESSION: Small to moderate RIGHT and small LEFT pleural effusions. RIGHT middle and  RIGHT lower lobe suspected pneumonia. Cardiomegaly.  Findings of small airway disease. **An incidental finding of potential clinical significance has been found. 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 Ascending thoracic aortic aneurysm. Recommend semi-annual imaging followup by CTA or MRA and referral to cardiothoracic surgery if not already obtained. This recommendation follows 2010 ACCF/AHA/AATS/ACR/ASA/SCA/SCAI/SIR/STS/SVM Guidelines for the Diagnosis and Management of Patients With Thoracic Aortic Disease. Circulation. 2010; 121: Z610-R604** Electronically Signed   By: Awilda Metro M.D.   On: 03/07/2017 22:01   Dg Chest Port 1 View  Result Date: 03/07/2017 CLINICAL DATA:  Respiratory failure, history CHF, hypertension, chronic atrial fibrillation, morbid obesity EXAM: PORTABLE CHEST 1 VIEW COMPARISON:  Portable exam 1523 hours compared to 02/26/2017 FINDINGS: RIGHT arm PICC line tip projects over SVC. Enlargement of cardiac silhouette with pulmonary vascular congestion. Increased pulmonary infiltrates particularly RIGHT with minimal RIGHT pleural effusion, likely representing pulmonary edema and CHF. No pneumothorax. Visualized osseous structures unremarkable. IMPRESSION: Enlargement of cardiac silhouette with pulmonary vascular congestion and probable mild pulmonary edema/CHF. Electronically Signed   By: Ulyses Southward M.D.   On: 03/07/2017 15:40    Intake/Output Summary (Last 24 hours) at 03/08/17 1128 Last data filed at 03/08/17 0314  Gross per 24 hour  Intake                0 ml  Output             2500 ml  Net            -2500 ml    STUDIES:    CULTURES: As noted  ANTIBIOTICS: 3/23 roc>>  SIGNIFICANT EVENTS: 4/1 off o2  LINES/TUBES:   DISCUSSION: MO (275 lbs) WM with general FTT for several months, non ambulatory, recent admit for decubitus ulcers with surgical interventions. Transferred to [facility] then to SNF and back to [facility] 3/20 with hypoxia and AMS. He has Afib on  eliquis, chf with last ef 55% , remains confused, and is now bipap dependent at night. He is a DNR. Continue Bipap, I doubt he will survie this hospitalization. PCCM will follow.  ASSESSMENT / PLAN:  PULMONARY A: Suspected OSA Hx of Pna P:   Nocturnal bipap  cx r with chf changes Off o2 currently  Better with 2.5 l neg I/O  CARDIOVASCULAR A:  CAF on eliquis CHF last ef 55% in 2016 P:  Diuresis as tolerated anticoagulation  RENAL Lab Results  Component Value Date   CREATININE 0.77 03/08/2017   CREATININE 0.79 03/07/2017   CREATININE 0.83 03/06/2017    Recent Labs Lab 03/06/17 0608 03/07/17 0352 03/08/17 0533  K 3.5 3.7 3.5     A:   No acute issue P:   Follow labs  GASTROINTESTINAL A:   Cirrhosis , liver lesion P:   PPI Follow LFT  HEMATOLOGIC  Recent Labs  03/07/17 0352 03/08/17 0533  HGB 7.5* 7.4*    A:   Anemia P:  Transfuse for hgb<7  INFECTIOUS A:   Chronic decub infection P:   Roc3/23>> 3/28 uc >>neg 3/20 uc>>providencia stuartii ss roc  ENDOCRINE  A:   No acute issues  P:   No TSH since 2017  NEUROLOGIC A:   Decreased LOC Helayne Seminole has evaluated ?dementia vs metabolic encephalopathy  P:   RASS goal: o Minimize sedation    FAMILY  - Updates: No family at bedside 4/1  - Inter-disciplinary family meet or Palliative Care meeting due by: He has been followed by palliative care and family has made him dnr but continue aggressive care.       Brett Canales Ashlin Hidalgo ACNP Ad[facility] Pollack PCCM Pager [phone] till 3 pm If no answer page [phone] 03/08/2017, 11:26 AM

## 2017-03-08 NOTE — Progress Notes (Signed)
Patient experiencing short bursts of asymptomatic bradycardia in the 30-40s. MD made aware.

## 2017-03-08 NOTE — Progress Notes (Signed)
Pt is off BIPAP and is drinking coffee. Pt is coherent and 98% on RA

## 2017-03-08 NOTE — Progress Notes (Signed)
03/08/2017- Respiratory care note- Pt placed on bipap for the night.  RN at bedside during placement.

## 2017-03-08 NOTE — Progress Notes (Signed)
[facility]                                                                              Patient Demographics  Jesus [name], is a 71 y.o. male, DOB - 02-Jul-1946, ZHY:[id number]  Admit date - 02/24/2017   Admitting Physician Michael Litter, MD  Outpatient Primary MD for the patient is [name],[name], [name]  Outpatient specialists:   LOS - 12  days    Chief Complaint  Patient presents with  . Altered Mental Status       Brief summary   70 y.o.male with a history of chronic atrial fibrillation (anticoagulated with Eliquis; CHADS-Vasc score of 4), CKD 3, chronic diastolic heart failure, HTN, chronic anemia, chronic pain, chronic venous insufficiency, chronic edema, cirrhosis of the liver by CT, incidental lesions on his liver and pancreas seen on prior CT imaging. He was recently hospitalized from 1/22-2/16 for aspiration pneumonia and infected sacral decubitus ulcer requiring surgical debridement. The wound ultimately required a wound vac and he was referred to [facility]. According to his daughter, he did well there, but has declined quickly since being back at [facility] for less than one week. Patient found to have altered mental status and probable sepsis.   Assessment & Plan   Acute hypoxic and hypercarbic respiratory failure / Acute pulmonary edema - CXR on admission showed CHF with pulmonary interstitial edema and small right pleural effusion - On admission, patient was placed on BiPAP, subsequently his respiratory status was improved and patient was transferred to the floor.  - He was evaluated by SLP - recommendation was for regular diet  - On 3/27, patient was noticed to be lethargic, not following commands, had received pain medication in the morning but did not respond to Narcan. ABG showed pH of 7.166 pCO2 102, pO2 84.6. Patient was transferred back to stepdown unit and placed on BiPAP (DNR status). - May have underlying OSA or obesity hypoventilation, will need  outpatient sleep study to qualify for CPAP. - Palliative medicine also reconsulted for goals of care - Patient was placed on BiPAP 3/30 hypoxic respiratory failure. He remained on BiPAP overnight however still lethargic, PCCM consulted. He recommended aggressive BiPAP qhs and prn during the day, Lasix for diuresis   Acute metabolic encephalopathy likely due to #1, underlying dementia  - Ammonia level was normal, - EEG showed mild to moderate diffuse slowing, no seizures - MRI was canceled due to patient's agitation. Neurology signed off, recommended no further work-up. - Seen on BiPAP today but alert and responding  Sepsissecondary to catheter associated Providenicia stuartii UTI as well as cellulitis and superimposed infection involving chronic nonhealing sacral decubitus ulcer, both present on admission. - Urine cx grew Providencia, Blood cx so far showed no growth - Pt was on vanco and zosyn but this was narrowed down to Rocephin 3/22, last date 3/31  - Sepsis physiology has resolved, will discontinue antibiotics after tomorrow's dose.   Mood disorder - Continue Depakote   Chronic atrial fibrillation / Coagulopathy  - CHADS vasc score 4 - Continue anticoagulation with apixaban - Rate controlled without beta blockers  Anemia of chronic disease - Due  to bone marrow suppression from history of liver cirrhosis - Has had 2 units PRBC transfusion on 3/22 - transfuse for hemoglobin less than 7.5  Diarrhea - C. difficile negative - No diarrhea in past   Hypokalemia - Due to sepsis, diarrhea, Currently stable  Chronic diastolic heart failure - 2 D ECHO in 12/2014 showed EF 55-60% - Continue IV Lasix for diuresis, increase 40mg  q8hrs, - IV fluids were discontinued yesterday, negative balance of 585 mL - Follow strict I's and O's and daily weights  Liver cirrhosis - Continue lactulose  - Continue protonix   Multiple wounds, sacral wounds, stage 4 sacral decubitus  ulcer - Stage IV sacrum, partial thickness buttocks, bilateral pretibial and calves, left lat foot, left inner thigh. Unstageable to bilateral heels, and lateral plantar surface of R foot, DTI to L medial heel - Sacrum 15cm x 18cm x 6cm deep stage 4 - Buttocks with multiple various size partial thickness wounds - Unstageable on Lateral plantar surface of R foot 1.5cm x 2cm x 0cm  - Unstageable R heel 2.5cm x 6cm x 0cm - R lateral calf 6cm x 3cm x 0.1cm partial thickness 5 - Proximal to right ankle 2.5cm x 0.5cm x 0cm dark purple.  - Two intact blisters to inner left thigh 6cm x 1.5cm and 3cm x 1cm - Left lateral calf 6cm x 3cm x 0.1cm partial thickness and one 3cm  - Left medial heel 2.5cm x 2.5cm  - Left lateral foot 4cm x 4cm and a 2cm x 4cm  - Left posterior calf with 4cm x 3cm x 0cm  - Dressing procedure/placement/frequency: NS moistened W to D dressing BID for sacral ulcer. Xeroform and foam to wounds on heels, foam to intact blisters, xeroform and gauze to partial thickness on BLE calves. Pt is already on a specialty bed. Pt already has pressure alleviating boots on.   Code Status: DNR  DVT Prophylaxis:  apixaban Family Communication: no family at bedside today.   Disposition Plan: *will need SNF.   Time Spent in minutes   25  minutes  Procedures:  BiPAP  Consultants:   Palliative care Neurology  Pulmonary critical care  Antimicrobials:   Rocephin 3/22-> 3/31  Vanco and zosyn 3/20 --> 3/22    Medications  Scheduled Meds: . apixaban  5 mg Oral BID  . chlorhexidine  15 mL Mouth Rinse BID  . divalproex  250 mg Oral Q12H  . feeding supplement (ENSURE ENLIVE)  237 mL Oral BID BM  . feeding supplement (PRO-STAT SUGAR FREE 64)  30 mL Oral BID  . furosemide  40 mg Intravenous Q12H  . lactulose  20 g Oral BID  . mouth rinse  15 mL Mouth Rinse BID  . memantine  5 mg Oral BID  . pantoprazole  40 mg Oral QHS  . sodium chloride flush  10-40 mL Intracatheter Q12H  .  sodium chloride flush  3 mL Intravenous Q12H   Continuous Infusions:  PRN Meds:.acetaminophen **OR** acetaminophen, haloperidol lactate, HYDROcodone-acetaminophen, hydrocortisone cream, ondansetron **OR** ondansetron (ZOFRAN) IV, sodium chloride flush   Antibiotics   Anti-infectives    Start     Dose/Rate Route Frequency Ordered Stop   02/27/17 1400  cefTRIAXone (ROCEPHIN) 1 g in dextrose 5 % 50 mL IVPB  Status:  Discontinued     1 g 100 mL/hr over 30 Minutes Intravenous Every 24 hours 02/27/17 1245 03/08/17 0926   02/26/17 2000  vancomycin (VANCOCIN) IVPB 1000 mg/200 mL premix  Status:  Discontinued  1,000 mg 200 mL/hr over 60 Minutes Intravenous Every 24 hours 02/26/17 0809 02/27/17 1245   02/25/17 0800  vancomycin (VANCOCIN) IVPB 1000 mg/200 mL premix  Status:  Discontinued     1,000 mg 200 mL/hr over 60 Minutes Intravenous Every 12 hours 02/24/17 2102 02/26/17 0809   02/25/17 0600  piperacillin-tazobactam (ZOSYN) IVPB 3.375 g  Status:  Discontinued     3.375 g 12.5 mL/hr over 240 Minutes Intravenous Every 8 hours 02/24/17 2102 02/27/17 1245   02/24/17 1930  vancomycin (VANCOCIN) 2,000 mg in sodium chloride 0.9 % 500 mL IVPB     2,000 mg 250 mL/hr over 120 Minutes Intravenous  Once 02/24/17 1915 02/24/17 2201   02/24/17 1915  piperacillin-tazobactam (ZOSYN) IVPB 3.375 g     3.375 g 100 mL/hr over 30 Minutes Intravenous  Once 02/24/17 1907 02/24/17 2359   02/24/17 1915  vancomycin (VANCOCIN) IVPB 1000 mg/200 mL premix  Status:  Discontinued     1,000 mg 200 mL/hr over 60 Minutes Intravenous  Once 02/24/17 1907 02/24/17 1915   02/24/17 1815  cefTRIAXone (ROCEPHIN) injection 1 g  Status:  Discontinued     1 g Intramuscular  Once 02/24/17 1801 02/24/17 1907        Subjective:   Kazden Huesca was seen and examined today. Patient seen on BiPAP however alert and awake, responding. No fevers or chills. No chest pain. No acute issues overnight.  Objective:   Vitals:    03/08/17 0314 03/08/17 0725 03/08/17 0738 03/08/17 1011  BP: (!) 113/52  (!) 117/58   Pulse: 67 80 66 71  Resp: (!) 25 (!) 23 (!) 27 (!) 27  Temp: 97.9 F (36.6 C) 98.4 F (36.9 C)    TempSrc: Axillary Axillary    SpO2: 100% 100% 100% 100%  Weight:      Height:        Intake/Output Summary (Last 24 hours) at 03/08/17 1201 Last data filed at 03/08/17 0314  Gross per 24 hour  Intake                0 ml  Output             2500 ml  Net            -2500 ml     Wt Readings from Last 3 Encounters:  03/06/17 124.7 kg (275 lb)  02/17/17 136.1 kg (300 lb)  02/16/17 (!) 136.2 kg (300 lb 3.2 oz)     Exam  General: on BiPAP, Following verbal commands, able to converse through the BiPAP  HEENT:   Neck: Supple, no JVD,   Cardiovascular: S1 S2 auscultated, no rubs, murmurs or gallops. Regular rate and rhythm.  Respiratory: Clear to auscultation bilaterally anteriorly, no wheezing  Gastrointestinal: Soft, nontender, nondistended, + bowel sounds  Ext: no cyanosis clubbing or edema  Neuro:   Skin: Pressure boots on bilaterally on the heels  Psych: alert and awake   Data Reviewed:  I have personally reviewed following labs and imaging studies  Micro Results Recent Results (from the past 240 hour(s))  Culture, Urine     Status: None   Collection Time: 03/04/17  9:27 PM  Result Value Ref Range Status   Specimen Description URINE, RANDOM  Final   Special Requests NONE  Final   Culture NO GROWTH  Final   Report Status 03/06/2017 FINAL  Final    Radiology Reports Ct Chest Wo Contrast  Result Date: 03/07/2017 CLINICAL DATA:  Acute on  chronic respiratory failure. Hypoxia and hypercapnia. History of CHF, atrial fibrillation, pulmonary vascular congestion, sepsis. EXAM: CT CHEST WITHOUT CONTRAST TECHNIQUE: Multidetector CT imaging of the chest was performed following the standard protocol without IV contrast. COMPARISON:  Chest radiograph March 07, 2017 at 1523 hours FINDINGS:  Cardiovascular: The heart is moderately enlarged. No pericardial effusions. Severe coronary artery calcifications. Ascending aorta is 4.3 by 4.7 cm in transaxial dimension with moderate calcific atherosclerosis. RIGHT PICC distal tip in the upper superior vena cava. Mediastinum/Nodes: No lymphadenopathy by CT size criteria though sensitivity decreased without intravenous contrast. Lungs/Pleura: Small to moderate RIGHT and small LEFT layering pleural effusions with underlying consolidation. Scattered calcified granulomata. Patchy to RIGHT middle lobe, RIGHT lower lobe consolidation with air bronchograms. Heterogeneous lung attenuation. No pulmonary masses, limited assessment for pulmonary nodule due to respiratory motion and habitus. Upper Abdomen: Nonacute. Streak artifact through upper abdomen, patient was scanned with arms at side. 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 Old RIGHT rib fractures. Moderate degenerative change of thoracic spine. IMPRESSION: Small to moderate RIGHT and small LEFT pleural effusions. RIGHT middle and RIGHT lower lobe suspected pneumonia. Cardiomegaly.  Findings of small airway disease. **An incidental finding of potential clinical significance has been found. 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 Ascending thoracic aortic aneurysm. Recommend semi-annual imaging followup by CTA or MRA and referral to cardiothoracic surgery if not already obtained. This recommendation follows 2010 ACCF/AHA/AATS/ACR/ASA/SCA/SCAI/SIR/STS/SVM Guidelines for the Diagnosis and Management of Patients With Thoracic Aortic Disease. Circulation. 2010; 121: Z610-R604** Electronically Signed   By: Awilda Metro M.D.   On: 03/07/2017 22:01   Dg Chest Port 1 View  Result Date: 03/07/2017 CLINICAL DATA:  Respiratory failure, history CHF, hypertension, chronic atrial fibrillation, morbid obesity EXAM: PORTABLE CHEST 1 VIEW COMPARISON:  Portable exam 1523 hours compared to  02/26/2017 FINDINGS: RIGHT arm PICC line tip projects over SVC. Enlargement of cardiac silhouette with pulmonary vascular congestion. Increased pulmonary infiltrates particularly RIGHT with minimal RIGHT pleural effusion, likely representing pulmonary edema and CHF. No pneumothorax. Visualized osseous structures unremarkable. IMPRESSION: Enlargement of cardiac silhouette with pulmonary vascular congestion and probable mild pulmonary edema/CHF. Electronically Signed   By: Ulyses Southward M.D.   On: 03/07/2017 15:40   Dg Chest Port 1 View  Result Date: 02/26/2017 CLINICAL DATA:  PICC line. EXAM: PORTABLE CHEST 1 VIEW COMPARISON:  02/24/2017 . FINDINGS: PICC line noted with tip over the cavoatrial junction. Cardiomegaly with pulmonary interstitial prominence. Small right pleural effusion. No pneumothorax . IMPRESSION: 1. PICC line noted with tip projected over the cavoatrial junction. 2. Congestive heart failure with pulmonary interstitial edema and small right pleural effusion . Electronically Signed   By: Maisie Fus  Register   On: 02/26/2017 11:37   Dg Chest Port 1 View  Result Date: 02/24/2017 CLINICAL DATA:  Altered mental status EXAM: PORTABLE CHEST 1 VIEW COMPARISON:  January 20, 2017 FINDINGS: There is persistent cardiomegaly. There is pulmonary venous hypertension. There is no edema or consolidation. No adenopathy. No bone lesions. IMPRESSION: Pulmonary vascular congestion without edema or consolidation. Electronically Signed   By: Bretta Bang III M.D.   On: 02/24/2017 15:08    Lab Data:  CBC:  Recent Labs Lab 03/04/17 0443 03/05/17 0413 03/06/17 5409 03/07/17 0352 03/08/17 0533  WBC 8.8 7.3 6.0 5.5 4.9  HGB 8.5* 8.4* 8.0* 7.5* 7.4*  HCT 29.0* 28.1* 26.9* 25.5* 24.2*  MCV 99.3 95.3 95.4 96.2 95.3  PLT 271 264 259 274 244   Basic Metabolic Panel:  Recent  Labs Lab 03/04/17 0443 03/05/17 0413 03/06/17 0608 03/07/17 0352 03/08/17 0533  NA 143 142 141 142 137  K 4.6 4.0 3.5 3.7  3.5  CL 101 102 101 101 95*  CO2 35* 32 34* 34* 35*  GLUCOSE 82 100* 117* 101* 85  BUN CREATININE 0.81 0.92 0.83 0.79 0.77  CALCIUM 9.2 9.2 9.0 9.0 8.6*  MG  --   --   --   --  2.0  PHOS  --   --   --   --  3.5   GFR: Estimated Creatinine Clearance: 113.9 mL/min (by C-G formula based on SCr of 0.77 mg/dL). Liver Function Tests: No results for input(s): AST, ALT, ALKPHOS, BILITOT, PROT, ALBUMIN in the last 168 hours. No results for input(s): LIPASE, AMYLASE in the last 168 hours.  Recent Labs Lab 03/03/17 1800 03/07/17 1400  AMMONIA 27 20   Coagulation Profile: No results for input(s): INR, PROTIME in the last 168 hours. Cardiac Enzymes: No results for input(s): CKTOTAL, CKMB, CKMBINDEX, TROPONINI in the last 168 hours. BNP (last 3 results) No results for input(s): PROBNP in the last 8760 hours. HbA1C: No results for input(s): HGBA1C in the last 72 hours. CBG:  Recent Labs Lab 03/02/17 1649  GLUCAP 129*   Lipid Profile: No results for input(s): CHOL, HDL, LDLCALC, TRIG, CHOLHDL, LDLDIRECT in the last 72 hours. Thyroid Function Tests: No results for input(s): TSH, T4TOTAL, FREET4, T3FREE, THYROIDAB in the last 72 hours. Anemia Panel: No results for input(s): VITAMINB12, FOLATE, FERRITIN, TIBC, IRON, RETICCTPCT in the last 72 hours. Urine analysis:    Component Value Date/Time   COLORURINE AMBER (A) 03/04/2017 2127   APPEARANCEUR HAZY (A) 03/04/2017 2127   LABSPEC 1.018 03/04/2017 2127   PHURINE 6.0 03/04/2017 2127   GLUCOSEU NEGATIVE 03/04/2017 2127   HGBUR MODERATE (A) 03/04/2017 2127   BILIRUBINUR NEGATIVE 03/04/2017 2127   KETONESUR 20 (A) 03/04/2017 2127   PROTEINUR 100 (A) 03/04/2017 2127   UROBILINOGEN 4.0 (H) 06/02/2012 1200   NITRITE NEGATIVE 03/04/2017 2127   LEUKOCYTESUR SMALL (A) 03/04/2017 2127     Lalani Winkles M.D. [facility] 03/08/2017, 12:01 PM  Pager: [phone] Between 7am to 7pm - call Pager - [phone]  After  7pm go to [email or web address] - password [email or web address]  Call night coverage person covering after 7pm

## 2017-03-09 ENCOUNTER — Other Ambulatory Visit (HOSPITAL_COMMUNITY): Payer: Self-pay

## 2017-03-09 DIAGNOSIS — R0902 Hypoxemia: Secondary | ICD-10-CM

## 2017-03-09 DIAGNOSIS — Z9989 Dependence on other enabling machines and devices: Secondary | ICD-10-CM

## 2017-03-09 DIAGNOSIS — G4733 Obstructive sleep apnea (adult) (pediatric): Secondary | ICD-10-CM

## 2017-03-09 LAB — CBC
HCT: 27.4 % — ABNORMAL LOW (ref 39.0–52.0)
Hemoglobin: 8.4 g/dL — ABNORMAL LOW (ref 13.0–17.0)
MCH: 28.8 pg (ref 26.0–34.0)
MCHC: 30.7 g/dL (ref 30.0–36.0)
MCV: 93.8 fL (ref 78.0–100.0)
PLATELETS: 297 10*3/uL (ref 150–400)
RBC: 2.92 MIL/uL — ABNORMAL LOW (ref 4.22–5.81)
RDW: 16.8 % — AB (ref 11.5–15.5)
WBC: 5.6 10*3/uL (ref 4.0–10.5)

## 2017-03-09 LAB — MAGNESIUM: Magnesium: 1.8 mg/dL (ref 1.7–2.4)

## 2017-03-09 LAB — BASIC METABOLIC PANEL
ANION GAP: 10 (ref 5–15)
BUN: 11 mg/dL (ref 6–20)
CALCIUM: 8.5 mg/dL — AB (ref 8.9–10.3)
CO2: 35 mmol/L — ABNORMAL HIGH (ref 22–32)
Chloride: 92 mmol/L — ABNORMAL LOW (ref 101–111)
Creatinine, Ser: 0.81 mg/dL (ref 0.61–1.24)
GFR calc Af Amer: >60 mL/min (ref >60)
GLUCOSE: 77 mg/dL (ref 65–99)
POTASSIUM: 3.5 mmol/L (ref 3.5–5.1)
SODIUM: 137 mmol/L (ref 135–145)

## 2017-03-09 LAB — PHOSPHORUS: PHOSPHORUS: 3.5 mg/dL (ref 2.5–4.6)

## 2017-03-09 LAB — PROCALCITONIN

## 2017-03-09 NOTE — Progress Notes (Signed)
[facility]                                                                              Patient Demographics  Jesus [name], is a 71 y.o. male, DOB - 1946/05/07, ZOX:[id number]  Admit date - 02/24/2017   Admitting Physician Michael Litter, MD  Outpatient Primary MD for the patient is [name],[name], [name]  Outpatient specialists:   LOS - 13  days    Chief Complaint  Patient presents with  . Altered Mental Status       Brief summary   70 y.o.male with a history of chronic atrial fibrillation (anticoagulated with Eliquis; CHADS-Vasc score of 4), CKD 3, chronic diastolic heart failure, HTN, chronic anemia, chronic pain, chronic venous insufficiency, chronic edema, cirrhosis of the liver by CT, incidental lesions on his liver and pancreas seen on prior CT imaging. He was recently hospitalized from 1/22-2/16 for aspiration pneumonia and infected sacral decubitus ulcer requiring surgical debridement. The wound ultimately required a wound vac and he was referred to [facility]. According to his daughter, he did well there, but has declined quickly since being back at [facility] for less than one week. Patient found to have altered mental status and probable sepsis.   Assessment & Plan   Acute hypoxic and hypercarbic respiratory failure / Acute pulmonary edema - CXR on admission showed CHF with pulmonary interstitial edema and small right pleural effusion - On admission, patient was placed on BiPAP, subsequently his respiratory status was improved and patient was transferred to the floor.  - He was evaluated by SLP - recommendation was for regular diet  - On 3/27, patient was noticed to be lethargic, not following commands, had received pain medication in the morning but did not respond to Narcan. ABG showed pH of 7.166 pCO2 102, pO2 84.6. Patient was transferred back to stepdown unit and placed on BiPAP (DNR status). - May have underlying OSA or obesity hypoventilation, will need  outpatient sleep study to qualify for CPAP. - Palliative medicine also reconsulted for goals of care - Patient was placed on BiPAP 3/30 hypoxic respiratory failure. He remained on BiPAP overnight however still lethargic, hence PCCM consulted, recommended aggressive BiPAP qhs and prn during the day, Lasix for diuresis - Patient currently alert awake and oriented however overnight takes the BiPAP off   Acute metabolic encephalopathy likely due to #1, underlying dementia  - Ammonia level was normal, - EEG showed mild to moderate diffuse slowing, no seizures - MRI was canceled due to patient's agitation. Neurology signed off, recommended no further work-up.  Sepsissecondary to catheter associated Providenicia stuartii UTI as well as cellulitis and superimposed infection involving chronic nonhealing sacral decubitus ulcer, both present on admission. - Urine cx grew Providencia, Blood cx so far showed no growth - Pt was on vanco and zosyn but this was narrowed down to Rocephin 3/22, last date 3/31  - Sepsis physiology has resolved, will discontinue antibiotics after tomorrow's dose.   Mood disorder - Continue Depakote   Chronic atrial fibrillation / Coagulopathy  - CHADS vasc score 4 - Continue anticoagulation with apixaban - Rate controlled without beta blockers  Anemia of  chronic disease - Due to bone marrow suppression from history of liver cirrhosis - Has had 2 units PRBC transfusion on 3/22 - transfuse for hemoglobin less than 7.5  Diarrhea - C. difficile negative - No diarrhea in past   Hypokalemia - Due to sepsis, diarrhea, Currently stable  Chronic diastolic heart failure - 2 D ECHO in 12/2014 showed EF 55-60% - Continue IV Lasix for diuresis,  q8hrs, - IV fluids were discontinued yesterday, negative balance of 5.7 L - Follow strict I's and O's and daily weights  Liver cirrhosis - Continue lactulose  - Continue protonix   Multiple wounds, sacral wounds,  stage 4 sacral decubitus ulcer - Stage IV sacrum, partial thickness buttocks, bilateral pretibial and calves, left lat foot, left inner thigh. Unstageable to bilateral heels, and lateral plantar surface of R foot, DTI to L medial heel - Sacrum 15cm x 18cm x 6cm deep stage 4 - Buttocks with multiple various size partial thickness wounds - Unstageable on Lateral plantar surface of R foot 1.5cm x 2cm x 0cm  - Unstageable R heel 2.5cm x 6cm x 0cm - R lateral calf 6cm x 3cm x 0.1cm partial thickness 5 - Proximal to right ankle 2.5cm x 0.5cm x 0cm dark purple.  - Two intact blisters to inner left thigh 6cm x 1.5cm and 3cm x 1cm - Left lateral calf 6cm x 3cm x 0.1cm partial thickness and one 3cm  - Left medial heel 2.5cm x 2.5cm  - Left lateral foot 4cm x 4cm and a 2cm x 4cm  - Left posterior calf with 4cm x 3cm x 0cm  - Dressing procedure/placement/frequency: NS moistened W to D dressing BID for sacral ulcer. Xeroform and foam to wounds on heels, foam to intact blisters, xeroform and gauze to partial thickness on BLE calves. Pt is already on a specialty bed. Pt already has pressure alleviating boots on.   Code Status: DNR  DVT Prophylaxis:  apixaban Family Communication: no family at bedside today.   Disposition Plan: *will need SNF. Will definitely need BiPAP qhs, 10/5  Time Spent in minutes   25  minutes  Procedures:  BiPAP  Consultants:   Palliative care Neurology  Pulmonary critical care  Antimicrobials:   Rocephin 3/22-> 3/31  Vanco and zosyn 3/20 --> 3/22    Medications  Scheduled Meds: . apixaban  5 mg Oral BID  . chlorhexidine  15 mL Mouth Rinse BID  . divalproex  250 mg Oral Q12H  . feeding supplement (ENSURE ENLIVE)  237 mL Oral BID BM  . feeding supplement (PRO-STAT SUGAR FREE 64)  30 mL Oral BID  . furosemide  40 mg Intravenous Q8H  . lactulose  20 g Oral BID  . mouth rinse  15 mL Mouth Rinse BID  . memantine  5 mg Oral BID  . pantoprazole  40 mg Oral QHS    . sodium chloride flush  10-40 mL Intracatheter Q12H  . sodium chloride flush  3 mL Intravenous Q12H   Continuous Infusions:  PRN Meds:.acetaminophen **OR** acetaminophen, haloperidol lactate, HYDROcodone-acetaminophen, hydrocortisone cream, ondansetron **OR** ondansetron (ZOFRAN) IV, sodium chloride flush   Antibiotics   Anti-infectives    Start     Dose/Rate Route Frequency Ordered Stop   02/27/17 1400  cefTRIAXone (ROCEPHIN) 1 g in dextrose 5 % 50 mL IVPB  Status:  Discontinued     1 g 100 mL/hr over 30 Minutes Intravenous Every 24 hours 02/27/17 1245 03/08/17 0926   02/26/17 2000  vancomycin (VANCOCIN)  IVPB 1000 mg/200 mL premix  Status:  Discontinued     1,000 mg 200 mL/hr over 60 Minutes Intravenous Every 24 hours 02/26/17 0809 02/27/17 1245   02/25/17 0800  vancomycin (VANCOCIN) IVPB 1000 mg/200 mL premix  Status:  Discontinued     1,000 mg 200 mL/hr over 60 Minutes Intravenous Every 12 hours 02/24/17 2102 02/26/17 0809   02/25/17 0600  piperacillin-tazobactam (ZOSYN) IVPB 3.375 g  Status:  Discontinued     3.375 g 12.5 mL/hr over 240 Minutes Intravenous Every 8 hours 02/24/17 2102 02/27/17 1245   02/24/17 1930  vancomycin (VANCOCIN) 2,000 mg in sodium chloride 0.9 % 500 mL IVPB     2,000 mg 250 mL/hr over 120 Minutes Intravenous  Once 02/24/17 1915 02/24/17 2201   02/24/17 1915  piperacillin-tazobactam (ZOSYN) IVPB 3.375 g     3.375 g 100 mL/hr over 30 Minutes Intravenous  Once 02/24/17 1907 02/24/17 2359   02/24/17 1915  vancomycin (VANCOCIN) IVPB 1000 mg/200 mL premix  Status:  Discontinued     1,000 mg 200 mL/hr over 60 Minutes Intravenous  Once 02/24/17 1907 02/24/17 1915   02/24/17 1815  cefTRIAXone (ROCEPHIN) injection 1 g  Status:  Discontinued     1 g Intramuscular  Once 02/24/17 1801 02/24/17 1907        Subjective:   Jesus [name] was seen and examined today. Patient seen of the BiPAP this morning, alert and oriented. However per RN he had taken off  the BiPAP after 5 hours. Denies any chest pain or shortness of breath. Feels a lot better this morning. No fevers or chills. No chest pain. No acute issues overnight.  Objective:   Vitals:   03/08/17 2335 03/09/17 0248 03/09/17 0809 03/09/17 1111  BP:  (!) 141/69  132/69  Pulse: 80 67    Resp: (!) 28 19    Temp:  97.7 F (36.5 C) 98 F (36.7 C) 98.3 F (36.8 C)  TempSrc:  Axillary Oral Oral  SpO2: 96% 100%    Weight:      Height:        Intake/Output Summary (Last 24 hours) at 03/09/17 1256 Last data filed at 03/09/17 1114  Gross per 24 hour  Intake              118 ml  Output             5275 ml  Net            -5157 ml     Wt Readings from Last 3 Encounters:  03/06/17 124.7 kg (275 lb)  02/17/17 136.1 kg (300 lb)  02/16/17 (!) 136.2 kg (300 lb 3.2 oz)     Exam  General: Alert and oriented 3, NAD  HEENT:   Neck: Supple, no JVD,   Cardiovascular: S1 S2 auscultated, no rubs, murmurs or gallops. Regular rate and rhythm.  Respiratory: Clear to auscultation bilaterally anteriorly, no wheezing  Gastrointestinal: Soft, nontender, nondistended, + bowel sounds  Ext: no cyanosis clubbing   Neuro:   Skin: Pressure boots on bilaterally on the heels  Psych: alert and awake   Data Reviewed:  I have personally reviewed following labs and imaging studies  Micro Results Recent Results (from the past 240 hour(s))  Culture, Urine     Status: None   Collection Time: 03/04/17  9:27 PM  Result Value Ref Range Status   Specimen Description URINE, RANDOM  Final   Special Requests NONE  Final   Culture  NO GROWTH  Final   Report Status 03/06/2017 FINAL  Final    Radiology Reports Ct Chest Wo Contrast  Result Date: 03/07/2017 CLINICAL DATA:  Acute on chronic respiratory failure. Hypoxia and hypercapnia. History of CHF, atrial fibrillation, pulmonary vascular congestion, sepsis. EXAM: CT CHEST WITHOUT CONTRAST TECHNIQUE: Multidetector CT imaging of the chest was  performed following the standard protocol without IV contrast. COMPARISON:  Chest radiograph March 07, 2017 at 1523 hours FINDINGS: Cardiovascular: The heart is moderately enlarged. No pericardial effusions. Severe coronary artery calcifications. Ascending aorta is 4.3 by 4.7 cm in transaxial dimension with moderate calcific atherosclerosis. RIGHT PICC distal tip in the upper superior vena cava. Mediastinum/Nodes: No lymphadenopathy by CT size criteria though sensitivity decreased without intravenous contrast. Lungs/Pleura: Small to moderate RIGHT and small LEFT layering pleural effusions with underlying consolidation. Scattered calcified granulomata. Patchy to RIGHT middle lobe, RIGHT lower lobe consolidation with air bronchograms. Heterogeneous lung attenuation. No pulmonary masses, limited assessment for pulmonary nodule due to respiratory motion and habitus. Upper Abdomen: Nonacute. Streak artifact through upper abdomen, patient was scanned with arms at side. 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 Old RIGHT rib fractures. Moderate degenerative change of thoracic spine. IMPRESSION: Small to moderate RIGHT and small LEFT pleural effusions. RIGHT middle and RIGHT lower lobe suspected pneumonia. Cardiomegaly.  Findings of small airway disease. **An incidental finding of potential clinical significance has been found. 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 Ascending thoracic aortic aneurysm. Recommend semi-annual imaging followup by CTA or MRA and referral to cardiothoracic surgery if not already obtained. This recommendation follows 2010 ACCF/AHA/AATS/ACR/ASA/SCA/SCAI/SIR/STS/SVM Guidelines for the Diagnosis and Management of Patients With Thoracic Aortic Disease. Circulation. 2010; 121: Z610-R604** Electronically Signed   By: Awilda Metro M.D.   On: 03/07/2017 22:01   Dg Chest Port 1 View  Result Date: 03/07/2017 CLINICAL DATA:  Respiratory failure, history CHF,  hypertension, chronic atrial fibrillation, morbid obesity EXAM: PORTABLE CHEST 1 VIEW COMPARISON:  Portable exam 1523 hours compared to 02/26/2017 FINDINGS: RIGHT arm PICC line tip projects over SVC. Enlargement of cardiac silhouette with pulmonary vascular congestion. Increased pulmonary infiltrates particularly RIGHT with minimal RIGHT pleural effusion, likely representing pulmonary edema and CHF. No pneumothorax. Visualized osseous structures unremarkable. IMPRESSION: Enlargement of cardiac silhouette with pulmonary vascular congestion and probable mild pulmonary edema/CHF. Electronically Signed   By: Ulyses Southward M.D.   On: 03/07/2017 15:40   Dg Chest Port 1 View  Result Date: 02/26/2017 CLINICAL DATA:  PICC line. EXAM: PORTABLE CHEST 1 VIEW COMPARISON:  02/24/2017 . FINDINGS: PICC line noted with tip over the cavoatrial junction. Cardiomegaly with pulmonary interstitial prominence. Small right pleural effusion. No pneumothorax . IMPRESSION: 1. PICC line noted with tip projected over the cavoatrial junction. 2. Congestive heart failure with pulmonary interstitial edema and small right pleural effusion . Electronically Signed   By: Maisie Fus  Register   On: 02/26/2017 11:37   Dg Chest Port 1 View  Result Date: 02/24/2017 CLINICAL DATA:  Altered mental status EXAM: PORTABLE CHEST 1 VIEW COMPARISON:  January 20, 2017 FINDINGS: There is persistent cardiomegaly. There is pulmonary venous hypertension. There is no edema or consolidation. No adenopathy. No bone lesions. IMPRESSION: Pulmonary vascular congestion without edema or consolidation. Electronically Signed   By: Bretta Bang III M.D.   On: 02/24/2017 15:08    Lab Data:  CBC:  Recent Labs Lab 03/05/17 0413 03/06/17 0608 03/07/17 0352 03/08/17 0533 03/09/17 0530  WBC 7.3 6.0 5.5 4.9 5.6  HGB 8.4* 8.0* 7.5*  7.4* 8.4*  HCT 28.1* 26.9* 25.5* 24.2* 27.4*  MCV 95.3 95.4 96.2 95.3 93.8  PLT 264 259 274 244 297   Basic Metabolic  Panel:  Recent Labs Lab 03/05/17 0413 03/06/17 0608 03/07/17 0352 03/08/17 0533 03/09/17 0530  NA 142 141 142 137 137  K 4.0 3.5 3.7 3.5 3.5  CL 102 101 101 95* 92*  CO2 32 34* 34* 35* 35*  GLUCOSE 100* 117* 101* 85 77  BUN CREATININE 0.92 0.83 0.79 0.77 0.81  CALCIUM 9.2 9.0 9.0 8.6* 8.5*  MG  --   --   --  2.0 1.8  PHOS  --   --   --  3.5 3.5   GFR: Estimated Creatinine Clearance: 112.5 mL/min (by C-G formula based on SCr of 0.81 mg/dL). Liver Function Tests: No results for input(s): AST, ALT, ALKPHOS, BILITOT, PROT, ALBUMIN in the last 168 hours. No results for input(s): LIPASE, AMYLASE in the last 168 hours.  Recent Labs Lab 03/03/17 1800 03/07/17 1400  AMMONIA 27 20   Coagulation Profile: No results for input(s): INR, PROTIME in the last 168 hours. Cardiac Enzymes: No results for input(s): CKTOTAL, CKMB, CKMBINDEX, TROPONINI in the last 168 hours. BNP (last 3 results) No results for input(s): PROBNP in the last 8760 hours. HbA1C: No results for input(s): HGBA1C in the last 72 hours. CBG:  Recent Labs Lab 03/02/17 1649  GLUCAP 129*   Lipid Profile: No results for input(s): CHOL, HDL, LDLCALC, TRIG, CHOLHDL, LDLDIRECT in the last 72 hours. Thyroid Function Tests: No results for input(s): TSH, T4TOTAL, FREET4, T3FREE, THYROIDAB in the last 72 hours. Anemia Panel: No results for input(s): VITAMINB12, FOLATE, FERRITIN, TIBC, IRON, RETICCTPCT in the last 72 hours. Urine analysis:    Component Value Date/Time   COLORURINE AMBER (A) 03/04/2017 2127   APPEARANCEUR HAZY (A) 03/04/2017 2127   LABSPEC 1.018 03/04/2017 2127   PHURINE 6.0 03/04/2017 2127   GLUCOSEU NEGATIVE 03/04/2017 2127   HGBUR MODERATE (A) 03/04/2017 2127   BILIRUBINUR NEGATIVE 03/04/2017 2127   KETONESUR 20 (A) 03/04/2017 2127   PROTEINUR 100 (A) 03/04/2017 2127   UROBILINOGEN 4.0 (H) 06/02/2012 1200   NITRITE NEGATIVE 03/04/2017 2127   LEUKOCYTESUR SMALL (A) 03/04/2017  2127     RAI,RIPUDEEP M.D. [facility] 03/09/2017, 12:56 PM  Pager: [phone] Between 7am to 7pm - call Pager - [phone]  After 7pm go to [email or web address] - password [email or web address]  Call night coverage person covering after 7pm

## 2017-03-09 NOTE — Progress Notes (Addendum)
PULMONARY / CRITICAL CARE MEDICINE   Name: Jesus [name] MRN: [id number] DOB: 11/26/1946    ADMISSION DATE:  02/24/2017 CONSULTATION DATE:  3/31  REFERRING MD:  [facility]  CHIEF COMPLAINT:  ams/FTT  HISTORY OF PRESENT ILLNESS:   MO (275 lbs) WM with general FTT for several months, non ambulatory, recent admit for decubitus ulcers with surgical interventions. Transferred to [facility] then to SNF and back to [facility] 3/20 with hypoxia and AMS. He has Afib on eliquis, chf with last ef 55% , remains confused, and is now bipap dependent at night. He is a DNR.  SUBJECTIVE:  Sitting up in bed on RA with no events overnight  VITAL SIGNS: BP 132/69 (BP Location: Left Arm)   Pulse 67   Temp 98.3 F (36.8 C) (Oral)   Resp 19   Ht  (1.778 m)   Wt 124.7 kg (275 lb)   SpO2 100%   BMI 39.46 kg/m   HEMODYNAMICS:    VENTILATOR SETTINGS:    INTAKE / OUTPUT: I/O last 3 completed shifts: In: -  Out: 5975 [Urine:5975]  PHYSICAL EXAMINATION: General: Obese pleasantly confused male,off o2 , eating Neuro:  Confused but pleasant HEENT:  No neck Cardiovascular: HSD Lungs:  Decreased bs bases, no crackles Abdomen:  Obese +bs Musculoskeletal:  Intact, +edema Skin: WDI  LABS:  BMET  Recent Labs Lab 03/07/17 0352 03/08/17 0533 03/09/17 0530  NA 142 137 137  K 3.7 3.5 3.5  CL 101 95* 92*  CO2 34* 35* 35*  BUN CREATININE 0.79 0.77 0.81  GLUCOSE 101* 85 77    Electrolytes  Recent Labs Lab 03/07/17 0352 03/08/17 0533 03/09/17 0530  CALCIUM 9.0 8.6* 8.5*  MG  --  2.0 1.8  PHOS  --  3.5 3.5    CBC  Recent Labs Lab 03/07/17 0352 03/08/17 0533 03/09/17 0530  WBC 5.5 4.9 5.6  HGB 7.5* 7.4* 8.4*  HCT 25.5* 24.2* 27.4*  PLT 274 244 297    Coag's No results for input(s): APTT, INR in the last 168 hours.  Sepsis Markers  Recent Labs Lab 03/07/17 1815 03/08/17 0533 03/09/17 0530  LATICACIDVEN  --  1.6  --   PROCALCITON 0.14 0.12 <0.10     ABG  Recent Labs Lab 03/06/17 0830 03/07/17 1100 03/07/17 1135  PHART 7.462* 7.458* 7.613*  PCO2ART 46.8 46.0 30.8*  PO2ART 71.9* 68.5* 96.0    Liver Enzymes No results for input(s): AST, ALT, ALKPHOS, BILITOT, ALBUMIN in the last 168 hours.  Cardiac Enzymes No results for input(s): TROPONINI, PROBNP in the last 168 hours.  Glucose  Recent Labs Lab 03/02/17 1649  GLUCAP 129*    Imaging No results found.  Intake/Output Summary (Last 24 hours) at 03/09/17 1438 Last data filed at 03/09/17 1114  Gross per 24 hour  Intake              118 ml  Output             5275 ml  Net            -5157 ml    STUDIES:    CULTURES: As noted  ANTIBIOTICS: 3/23 roc>>  SIGNIFICANT EVENTS: 4/1 off o2  LINES/TUBES:  I reviewed chest CT myself, pleural effusion onoted.  DISCUSSION: MO (275 lbs) WM with general FTT for several months, non ambulatory, recent admit for decubitus ulcers with surgical interventions. Transferred to [facility] then to SNF and back to [facility] 3/20 with hypoxia  and AMS. He has Afib on eliquis, chf with last ef 55%, mental status improved and patient patient is now off BiPAP.  OSA by history.  Discussed with PCCM-NP.  ASSESSMENT / PLAN:  OSA  - CPAP overnight and when asleep  - Arrange f/u upon discharge with PCCM 4/16 at 11:30 with Rubye Oaks, NP  PNA: treatment course complete  - Monitor off abx  Pleural effusion  - No thora now  - Treat heart failure  Pulmonary edema:  - Diureses  PCCM will sign off, please call back if needed.  Alyson Reedy, M.D. [facility] Pulmonary/Critical Care Medicine. Pager: [phone]. After hours pager: [phone].  03/09/2017, 2:38 PM

## 2017-03-09 NOTE — Care Management Important Message (Signed)
Important Message  Patient Details  Name: Jesus [name] MRN: [id number] Date of Birth: 1945-12-15   Medicare Important Message Given:  Yes    Lorretta Kerce Stefan Church 03/09/2017, 4:36 PM

## 2017-03-09 NOTE — Progress Notes (Signed)
qPhysical Therapy Treatment Patient Details Name: Jesus [name] MRN: [id number] DOB: 01/29/1946 Today's Date: 03/09/2017    History of Present Illness Patient is a 71 yo male admitted 02/24/17 from [facility] with acute respiratory failure with hypoxia and hypercapnia, AMS, sepsis  UTI/wounds.    PMH:  memory loss, sacral and LE ulcers, CKD, Afib, anemia, CHF, PAD, morbid obesity, HTN    PT Comments    Pt is extremely limited by contractures in LEs and pain.  He has the most strength (weak 3-/5 at best below 90 degrees) in his right arm, but it is not very functional.  He is totally dependent and cannot help reposition or even feed himself.  He will need SNF residential care at d/c.      Follow Up Recommendations  SNF;Supervision/Assistance - 24 hour     Equipment Recommendations  Hospital bed;Other (comment) (hoyer lift, air mattress overlay due to wounds)    Recommendations for Other Services   NA     Precautions / Restrictions Precautions Precautions: Fall Precaution Comments: very painful and contracted bil LEs with wounds on bil lower legs, and sacrum.      Mobility  Bed Mobility Overal bed mobility: Needs Assistance Bed Mobility: Rolling;Supine to Sit Rolling: Total assist   Supine to sit: Total assist     General bed mobility comments: Attempted to reposition pt in the bed and I was unable to move his legs (due to pain), but was able to readjust his trunk (he has little to no strength in his left upper extremity, and some, but not functional in his right upper extremity).              Cognition Arousal/Alertness: Awake/alert Behavior During Therapy: WFL for tasks assessed/performed Overall Cognitive Status: History of cognitive impairments - at baseline                                           General Comments General comments (skin integrity, edema, etc.): We were unable to get pt EOB as he is contracted and painful when you move bil  LEs and has little to no strength in bil upper extremities to be able to help.  Pt was repositioned for comfort this session.        Pertinent Vitals/Pain Pain Assessment: Faces Faces Pain Scale: Hurts whole lot Pain Location: with movement of R leg greater than left leg.  Pain Descriptors / Indicators: Guarding;Grimacing Pain Intervention(s): Limited activity within patient's tolerance;Monitored during session;Repositioned       Prior Function            PT Goals (current goals can now be found in the care plan section) Acute Rehab PT Goals Patient Stated Goal: to still be able to move.   Progress towards PT goals: Not progressing toward goals - comment (pt is too painful to tolerate EOB. )    Frequency    Min 2X/week      PT Plan Current plan remains appropriate       End of Session   Activity Tolerance: Patient limited by pain;Other (comment) (limited by weakness, contracture) Patient left: in bed;with call bell/phone within reach Nurse Communication: Mobility status PT Visit Diagnosis: Muscle weakness (generalized) (M62.81);Difficulty in walking, not elsewhere classified (R26.2);Pain Pain - Right/Left: Right Pain - part of body: Leg     Time: 1436-1500 PT Time Calculation (min) (ACUTE  ONLY): 24 min  Charges:  $Therapeutic Activity: 8-22 mins                          Tomika Eckles B. Titan Karner, PT, DPT [phone]   03/09/2017, 5:05 PM

## 2017-03-10 ENCOUNTER — Other Ambulatory Visit (HOSPITAL_COMMUNITY): Payer: Self-pay

## 2017-03-10 LAB — PHOSPHORUS: Phosphorus: 3.3 mg/dL (ref 2.5–4.6)

## 2017-03-10 LAB — MAGNESIUM: MAGNESIUM: 1.7 mg/dL (ref 1.7–2.4)

## 2017-03-10 MED ORDER — MAGNESIUM CITRATE PO SOLN
1.0000 | Freq: Once | ORAL | Status: AC
  Administered 2017-03-10: 1 via ORAL
  Filled 2017-03-10: qty 296

## 2017-03-10 NOTE — NC FL2 (Signed)
[place] MEDICAID FL2 LEVEL OF CARE SCREENING TOOL     IDENTIFICATION  Patient Name: Jesus [name] Birthdate: Mar 11, 1946 Sex: male Admission Date (Current Location): 02/24/2017  [facility] and [place] Number:  Producer, television/film/video and Address:  [facility] [place], [address], [place], [place] [address]      Provider Number: [id number]  Attending Physician Name and Address:  Cathren Harsh, MD  Relative Name and Phone Number:       Current Level of Care: Hospital Recommended Level of Care: Skilled Nursing Facility (with palliative follow up) Prior Approval Number:    Date Approved/Denied:   PASRR Number: [id number]  Discharge Plan: SNF (with palliative follow up)    Current Diagnoses: Patient Active Problem List   Diagnosis Date Noted  . Hypoxemia   . OSA on CPAP   . Acute respiratory failure with hypoxia and hypercapnia (HCC) 02/28/2017  . Acute pulmonary edema (HCC) 02/28/2017  . Sepsis secondary to UTI (HCC) 02/28/2017  . Goals of care, counseling/discussion   . Palliative care encounter   . Acute encephalopathy 02/24/2017  . Palliative care by specialist   . Decubitus ulcer of sacral region, unstageable (HCC) 12/30/2016  . Altered mental status 12/30/2016  . Hyponatremia 07/15/2015  . Hypokalemia   . Chronic diastolic CHF (congestive heart failure) (HCC) 04/02/2015  . Pulmonary vascular congestion   . Peripheral edema   . 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 Renal artery stenosis (HCC) 02/17/2014  . Peripheral vascular disease (HCC) 04/15/2013  . Morbid obesity (HCC) 06/11/2012  . Unspecified essential hypertension 05/18/2012  . Atrial fibrillation (HCC) 04/27/2012    Orientation RESPIRATION BLADDER Height & Weight     Self, Place  O2 (Nasal Canula 3 L. Bipap at night: Set rate (12), respiratory rate (27), IPAP (10), EPAP (5), Oxygen Percentage (30), Flow Rate (0), Minute Ventilation (8.7), Leak (35),  Peak Inspiratory Pressure (11), Tidal Volume (406)) Incontinent, Indwelling catheter Weight: 275 lb (124.7 kg) Height:   (177.8 cm)  BEHAVIORAL SYMPTOMS/MOOD NEUROLOGICAL BOWEL NUTRITION STATUS  Other (Comment) (Poor judgement, poor safety awareness)   Continent Diet (Heart healthy)  AMBULATORY STATUS COMMUNICATION OF NEEDS Skin   Total Care Verbally Other (Comment), PU Stage and Appropriate Care (Blister, Cellulitis, Skin tear. Unstageable wound: Right heel (Gauze- Xeroform- Daily). Venous Stasis Ulcer: Both legs (Gauze- Xeroform- daily). Non-Pressure Wound: Left buttocks (ABD- Xeroform- Daily).)   PU Stage 2 Dressing: Daily (Right and left buttocks)   PU Stage 4 Dressing: BID (Right sacrum: ABD, Gauze, Moist to dry)               Personal Care Assistance Level of Assistance  Bathing, Dressing Bathing Assistance: Maximum assistance   Dressing Assistance: Maximum assistance     Functional Limitations Info  Sight, Hearing, Speech Sight Info: Adequate Hearing Info: Adequate Speech Info: Adequate    SPECIAL CARE FACTORS FREQUENCY  PT (By licensed PT)     PT Frequency: 5 x week             Contractures Contractures Info: Present    Additional Factors Info  Code Status, Allergies Code Status Info: DNR Allergies Info: Amlodipine Besylate, Levaquin (Levofloxacin In D5w)          Current Medications (03/10/2017):  This is the current hospital active medication list Current Facility-Administered Medications  Medication Dose Route Frequency Provider Last Rate Last Dose  . acetaminophen (TYLENOL) tablet 650 mg  650 mg Oral Q6H  PRN Michael Litter, MD   650 mg at 02/28/17 1113   Or  . acetaminophen (TYLENOL) suppository 650 mg  650 mg Rectal Q6H PRN Michael Litter, MD      . apixaban (ELIQUIS) tablet 5 mg  5 mg Oral BID Richarda Overlie, MD   5 mg at 03/10/17 1007  . chlorhexidine (PERIDEX) 0.12 % solution 15 mL  15 mL Mouth Rinse BID Ripudeep K Rai, MD   15 mL at 03/10/17 1006   . divalproex (DEPAKOTE SPRINKLE) capsule 250 mg  250 mg Oral Q12H Richarda Overlie, MD   250 mg at 03/10/17 1006  . feeding supplement (ENSURE ENLIVE) (ENSURE ENLIVE) liquid 237 mL  237 mL Oral BID BM Richarda Overlie, MD   237 mL at 03/10/17 1513  . feeding supplement (PRO-STAT SUGAR FREE 64) liquid 30 mL  30 mL Oral BID Richarda Overlie, MD   30 mL at 03/10/17 1006  . furosemide (LASIX) injection 40 mg  40 mg Intravenous Q8H Ripudeep K Rai, MD   40 mg at 03/10/17 1259  . haloperidol lactate (HALDOL) injection 1 mg  1 mg Intravenous Q6H PRN Ripudeep Jenna Luo, MD   1 mg at 03/10/17 0053  . HYDROcodone-acetaminophen (NORCO/VICODIN) 5-325 MG per tablet 1 tablet  1 tablet Oral Q6H PRN Ripudeep Jenna Luo, MD   1 tablet at 03/09/17 1405  . hydrocortisone cream 1 %   Topical Q6H PRN Alison Murray, MD      . lactulose (CHRONULAC) 10 GM/15ML solution 20 g  20 g Oral BID Richarda Overlie, MD   20 g at 03/10/17 1006  . MEDLINE mouth rinse  15 mL Mouth Rinse BID Michael Litter, MD   15 mL at 03/10/17 1054  . memantine (NAMENDA) tablet 5 mg  5 mg Oral BID Richarda Overlie, MD   5 mg at 03/10/17 1007  . ondansetron (ZOFRAN) tablet 4 mg  4 mg Oral Q6H PRN Michael Litter, MD       Or  . ondansetron [facility]) injection 4 mg  4 mg Intravenous Q6H PRN Michael Litter, MD   4 mg at 02/27/17 1654  . pantoprazole (PROTONIX) EC tablet 40 mg  40 mg Oral QHS Alison Murray, MD   40 mg at 03/09/17 2152  . sodium chloride flush (NS) 0.9 % injection 10-40 mL  10-40 mL Intracatheter Q12H Richarda Overlie, MD   10 mL at 03/10/17 1255  . sodium chloride flush (NS) 0.9 % injection 10-40 mL  10-40 mL Intracatheter PRN Richarda Overlie, MD         Discharge Medications: Please see discharge summary for a list of discharge medications.  Relevant Imaging Results:  Relevant Lab Results:   Additional Information SS#: [id number]  Margarito Liner, LCSW

## 2017-03-10 NOTE — Clinical Social Work Note (Signed)
Per RN, patient will likely discharge tomorrow. [facility] admissions coordinator stated that it should be fine for patient to return and that CSW can go ahead and fax clinicals to [facility] for auth. CSW left voicemail for daughter.  Charlynn Court, CSW [phone]

## 2017-03-10 NOTE — Progress Notes (Signed)
Pt taken off BIPAP and placed on 3L nasal cannula.  Tolerating well at this time.  RT will continue to monitor.

## 2017-03-10 NOTE — Progress Notes (Signed)
[facility]                                                                              Patient Demographics  Jesus [name], is a 71 y.o. male, DOB - January 22, 1946, ZOX:[id number]  Admit date - 02/24/2017   Admitting Physician Michael Litter, MD  Outpatient Primary MD for the patient is [name],[name], [name]  Outpatient specialists:   LOS - 14  days    Chief Complaint  Patient presents with  . Altered Mental Status       Brief summary   70 y.o.male with a history of chronic atrial fibrillation (anticoagulated with Eliquis; CHADS-Vasc score of 4), CKD 3, chronic diastolic heart failure, HTN, chronic anemia, chronic pain, chronic venous insufficiency, chronic edema, cirrhosis of the liver by CT, incidental lesions on his liver and pancreas seen on prior CT imaging. He was recently hospitalized from 1/22-2/16 for aspiration pneumonia and infected sacral decubitus ulcer requiring surgical debridement. The wound ultimately required a wound vac and he was referred to [facility]. According to his daughter, he did well there, but has declined quickly since being back at [facility] for less than one week. Patient found to have altered mental status and probable sepsis.  Patient was found to have acute hypoxic and hypercarbic respiratory failure, initially admitted to stepdown unit, subsequently yesterday status improved and he was transferred to floor. However on 3/27, patient was again noticed to be lethargic and PCO2 >100 on ABG. He was transferred to stepdown unit and placed on BiPAP. Patient was noticed to have mental status changes and respiratory failure every time he was weaned off of BiPAP. Pulmonology consult was obtained and recommended continuing BiPAP QHS. He was also placed on Lasix for diuresis. Plan for DC to skilled nursing [facility] next 44-48 hours.   Assessment & Plan   Acute hypoxic and hypercarbic respiratory failure / Acute pulmonary edema - CXR on admission showed CHF  with pulmonary interstitial edema and small right pleural effusion - On admission, patient was placed on BiPAP, subsequently his respiratory status was improved and patient was transferred to the floor.  - He was evaluated by SLP - recommendation was for regular diet  - On 3/27, patient was noticed to be lethargic, not following commands, had received pain medication in the morning but did not respond to Narcan. ABG showed pH of 7.166 pCO2 102, pO2 84.6. Patient was transferred back to stepdown unit and placed on BiPAP (DNR status). - May have underlying OSA or obesity hypoventilation, will need outpatient sleep study to qualify for CPAP. - Palliative medicine also reconsulted for goals of care - Patient was placed on BiPAP 3/30 hypoxic respiratory failure. He remained on BiPAP overnight however still lethargic, hence PCCM consulted, recommended aggressive BiPAP qhs and prn during the day, Lasix for diuresis - Patient has been doing well with BiPAP qhs, he will need to be discharged on BiPAP QHS (settings 10/5. Rate 12, 3-4L O2 flow, Fio2 30%) at skilled nursing facility   Acute metabolic encephalopathy likely due to #1, underlying dementia  - Ammonia level was normal, - EEG showed mild to moderate diffuse slowing, no seizures -  MRI was canceled due to patient's agitation. Neurology signed off, recommended no further work-up.   Acute on Chronic diastolic heart failure - 2 D ECHO in 12/2014 showed EF 55-60% - Continue IV Lasix for diuresis,  q 8hrs, - IV fluids were discontinued yesterday, negative balance of 7.8 L.   - Follow strict I's and O's and daily weights  Sepsissecondary to catheter associated Providenicia stuartii UTI as well as cellulitis and superimposed infection involving chronic nonhealing sacral decubitus ulcer, both present on admission. - Urine cx grew Providencia, Blood cx so far showed no growth - Pt was on vanco and zosyn but this was narrowed down to Rocephin 3/22,  last date 3/31  - Sepsis physiology has resolved  Mood disorder - Continue Depakote   Chronic atrial fibrillation / Coagulopathy  - CHADS vasc score 4 - Continue anticoagulation with apixaban - Rate controlled without beta blockers  Anemia of chronic disease - Due to bone marrow suppression from history of liver cirrhosis - Has had 2 units PRBC transfusion on 3/22 - transfuse for hemoglobin less than 7.5  Diarrhea - C. difficile negative - No diarrhea in past   Hypokalemia - Due to sepsis, diarrhea, Currently stable  Liver cirrhosis - Continue lactulose  - Continue protonix   Multiple wounds, sacral wounds, stage 4 sacral decubitus ulcer - Stage IV sacrum, partial thickness buttocks, bilateral pretibial and calves, left lat foot, left inner thigh. Unstageable to bilateral heels, and lateral plantar surface of R foot, DTI to L medial heel - Sacrum 15cm x 18cm x 6cm deep stage 4 - Buttocks with multiple various size partial thickness wounds - Unstageable on Lateral plantar surface of R foot 1.5cm x 2cm x 0cm  - Unstageable R heel 2.5cm x 6cm x 0cm - R lateral calf 6cm x 3cm x 0.1cm partial thickness 5 - Proximal to right ankle 2.5cm x 0.5cm x 0cm dark purple.  - Two intact blisters to inner left thigh 6cm x 1.5cm and 3cm x 1cm - Left lateral calf 6cm x 3cm x 0.1cm partial thickness and one 3cm  - Left medial heel 2.5cm x 2.5cm  - Left lateral foot 4cm x 4cm and a 2cm x 4cm  - Left posterior calf with 4cm x 3cm x 0cm  - Dressing procedure/placement/frequency: NS moistened W to D dressing BID for sacral ulcer. Xeroform and foam to wounds on heels, foam to intact blisters, xeroform and gauze to partial thickness on BLE calves. Pt is already on a specialty bed. Pt already has pressure alleviating boots on.   Code Status: DNR  DVT Prophylaxis:  apixaban Family Communication: no family at bedside today.   Disposition Plan: will need SNF. Will definitely need BiPAP qhs,  10/5  Time Spent in minutes   25  minutes  Procedures:  BiPAP  Consultants:   Palliative care Neurology  Pulmonary critical care  Antimicrobials:   Rocephin 3/22-> 3/31  Vanco and zosyn 3/20 --> 3/22    Medications  Scheduled Meds: . apixaban  5 mg Oral BID  . chlorhexidine  15 mL Mouth Rinse BID  . divalproex  250 mg Oral Q12H  . feeding supplement (ENSURE ENLIVE)  237 mL Oral BID BM  . feeding supplement (PRO-STAT SUGAR FREE 64)  30 mL Oral BID  . furosemide  40 mg Intravenous Q8H  . lactulose  20 g Oral BID  . mouth rinse  15 mL Mouth Rinse BID  . memantine  5 mg Oral BID  .  pantoprazole  40 mg Oral QHS  . sodium chloride flush  10-40 mL Intracatheter Q12H   Continuous Infusions:  PRN Meds:.acetaminophen **OR** acetaminophen, haloperidol lactate, HYDROcodone-acetaminophen, hydrocortisone cream, ondansetron **OR** ondansetron (ZOFRAN) IV, sodium chloride flush   Antibiotics   Anti-infectives    Start     Dose/Rate Route Frequency Ordered Stop   02/27/17 1400  cefTRIAXone (ROCEPHIN) 1 g in dextrose 5 % 50 mL IVPB  Status:  Discontinued     1 g 100 mL/hr over 30 Minutes Intravenous Every 24 hours 02/27/17 1245 03/08/17 0926   02/26/17 2000  vancomycin (VANCOCIN) IVPB 1000 mg/200 mL premix  Status:  Discontinued     1,000 mg 200 mL/hr over 60 Minutes Intravenous Every 24 hours 02/26/17 0809 02/27/17 1245   02/25/17 0800  vancomycin (VANCOCIN) IVPB 1000 mg/200 mL premix  Status:  Discontinued     1,000 mg 200 mL/hr over 60 Minutes Intravenous Every 12 hours 02/24/17 2102 02/26/17 0809   02/25/17 0600  piperacillin-tazobactam (ZOSYN) IVPB 3.375 g  Status:  Discontinued     3.375 g 12.5 mL/hr over 240 Minutes Intravenous Every 8 hours 02/24/17 2102 02/27/17 1245   02/24/17 1930  vancomycin (VANCOCIN) 2,000 mg in sodium chloride 0.9 % 500 mL IVPB     2,000 mg 250 mL/hr over 120 Minutes Intravenous  Once 02/24/17 1915 02/24/17 2201   02/24/17 1915   piperacillin-tazobactam (ZOSYN) IVPB 3.375 g     3.375 g 100 mL/hr over 30 Minutes Intravenous  Once 02/24/17 1907 02/24/17 2359   02/24/17 1915  vancomycin (VANCOCIN) IVPB 1000 mg/200 mL premix  Status:  Discontinued     1,000 mg 200 mL/hr over 60 Minutes Intravenous  Once 02/24/17 1907 02/24/17 1915   02/24/17 1815  cefTRIAXone (ROCEPHIN) injection 1 g  Status:  Discontinued     1 g Intramuscular  Once 02/24/17 1801 02/24/17 1907        Subjective:   Corrie Jamar was seen and examined today. On Bipap at the time of my examination. Currently improving, alert and oriented. Denies any chest pain. No fevers or chills. No chest pain. No acute issues overnight.  Objective:   Vitals:   03/10/17 0743 03/10/17 0814 03/10/17 1221 03/10/17 1224  BP:   127/76 127/76  Pulse:  84 65 76  Resp:  (!) 26 19 (!) 32  Temp: 98 F (36.7 C)  98.9 F (37.2 C)   TempSrc: Axillary  Axillary   SpO2:  97% 100% 100%  Weight:      Height:        Intake/Output Summary (Last 24 hours) at 03/10/17 1425 Last data filed at 03/10/17 1311  Gross per 24 hour  Intake              720 ml  Output             2800 ml  Net            -2080 ml     Wt Readings from Last 3 Encounters:  03/06/17 124.7 kg (275 lb)  02/17/17 136.1 kg (300 lb)  02/16/17 (!) 136.2 kg (300 lb 3.2 oz)     Exam  General: Alert and oriented 3, NAD  HEENT:   Neck: Supple, no JVD,   Cardiovascular: S1 S2 clear, RRR  Respiratory: Clear to auscultation bilaterally anteriorly, no wheezing  Gastrointestinal: Morbidly obese, Soft, nontender, nondistended, + bowel sounds  Ext: no cyanosis clubbing   Neuro:   Skin: Pressure boots  on bilaterally on the heels  Psych: alert and awake   Data Reviewed:  I have personally reviewed following labs and imaging studies  Micro Results Recent Results (from the past 240 hour(s))  Culture, Urine     Status: None   Collection Time: 03/04/17  9:27 PM  Result Value Ref Range  Status   Specimen Description URINE, RANDOM  Final   Special Requests NONE  Final   Culture NO GROWTH  Final   Report Status 03/06/2017 FINAL  Final    Radiology Reports Ct Chest Wo Contrast  Result Date: 03/07/2017 CLINICAL DATA:  Acute on chronic respiratory failure. Hypoxia and hypercapnia. History of CHF, atrial fibrillation, pulmonary vascular congestion, sepsis. EXAM: CT CHEST WITHOUT CONTRAST TECHNIQUE: Multidetector CT imaging of the chest was performed following the standard protocol without IV contrast. COMPARISON:  Chest radiograph March 07, 2017 at 1523 hours FINDINGS: Cardiovascular: The heart is moderately enlarged. No pericardial effusions. Severe coronary artery calcifications. Ascending aorta is 4.3 by 4.7 cm in transaxial dimension with moderate calcific atherosclerosis. RIGHT PICC distal tip in the upper superior vena cava. Mediastinum/Nodes: No lymphadenopathy by CT size criteria though sensitivity decreased without intravenous contrast. Lungs/Pleura: Small to moderate RIGHT and small LEFT layering pleural effusions with underlying consolidation. Scattered calcified granulomata. Patchy to RIGHT middle lobe, RIGHT lower lobe consolidation with air bronchograms. Heterogeneous lung attenuation. No pulmonary masses, limited assessment for pulmonary nodule due to respiratory motion and habitus. Upper Abdomen: Nonacute. Streak artifact through upper abdomen, patient was scanned with arms at side. 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 Old RIGHT rib fractures. Moderate degenerative change of thoracic spine. IMPRESSION: Small to moderate RIGHT and small LEFT pleural effusions. RIGHT middle and RIGHT lower lobe suspected pneumonia. Cardiomegaly.  Findings of small airway disease. **An incidental finding of potential clinical significance has been found. 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 Ascending thoracic aortic aneurysm. Recommend semi-annual imaging  followup by CTA or MRA and referral to cardiothoracic surgery if not already obtained. This recommendation follows 2010 ACCF/AHA/AATS/ACR/ASA/SCA/SCAI/SIR/STS/SVM Guidelines for the Diagnosis and Management of Patients With Thoracic Aortic Disease. Circulation. 2010; 121: Z610-R604** Electronically Signed   By: Awilda Metro M.D.   On: 03/07/2017 22:01   Dg Chest Port 1 View  Result Date: 03/07/2017 CLINICAL DATA:  Respiratory failure, history CHF, hypertension, chronic atrial fibrillation, morbid obesity EXAM: PORTABLE CHEST 1 VIEW COMPARISON:  Portable exam 1523 hours compared to 02/26/2017 FINDINGS: RIGHT arm PICC line tip projects over SVC. Enlargement of cardiac silhouette with pulmonary vascular congestion. Increased pulmonary infiltrates particularly RIGHT with minimal RIGHT pleural effusion, likely representing pulmonary edema and CHF. No pneumothorax. Visualized osseous structures unremarkable. IMPRESSION: Enlargement of cardiac silhouette with pulmonary vascular congestion and probable mild pulmonary edema/CHF. Electronically Signed   By: Ulyses Southward M.D.   On: 03/07/2017 15:40   Dg Chest Port 1 View  Result Date: 02/26/2017 CLINICAL DATA:  PICC line. EXAM: PORTABLE CHEST 1 VIEW COMPARISON:  02/24/2017 . FINDINGS: PICC line noted with tip over the cavoatrial junction. Cardiomegaly with pulmonary interstitial prominence. Small right pleural effusion. No pneumothorax . IMPRESSION: 1. PICC line noted with tip projected over the cavoatrial junction. 2. Congestive heart failure with pulmonary interstitial edema and small right pleural effusion . Electronically Signed   By: Maisie Fus  Register   On: 02/26/2017 11:37   Dg Chest Port 1 View  Result Date: 02/24/2017 CLINICAL DATA:  Altered mental status EXAM: PORTABLE CHEST 1 VIEW COMPARISON:  January 20, 2017 FINDINGS: There  is persistent cardiomegaly. There is pulmonary venous hypertension. There is no edema or consolidation. No adenopathy. No bone  lesions. IMPRESSION: Pulmonary vascular congestion without edema or consolidation. Electronically Signed   By: Bretta Bang III M.D.   On: 02/24/2017 15:08    Lab Data:  CBC:  Recent Labs Lab 03/05/17 0413 03/06/17 1610 03/07/17 0352 03/08/17 0533 03/09/17 0530  WBC 7.3 6.0 5.5 4.9 5.6  HGB 8.4* 8.0* 7.5* 7.4* 8.4*  HCT 28.1* 26.9* 25.5* 24.2* 27.4*  MCV 95.3 95.4 96.2 95.3 93.8  PLT 264 259 274 244 297   Basic Metabolic Panel:  Recent Labs Lab 03/05/17 0413 03/06/17 0608 03/07/17 0352 03/08/17 0533 03/09/17 0530 03/10/17 0538  NA 142 141 142 137 137  --   K 4.0 3.5 3.7 3.5 3.5  --   CL 102 101 101 95* 92*  --   CO2 32 34* 34* 35* 35*  --   GLUCOSE 100* 117* 101* 85 77  --   BUN --   CREATININE 0.92 0.83 0.79 0.77 0.81  --   CALCIUM 9.2 9.0 9.0 8.6* 8.5*  --   MG  --   --   --  2.0 1.8 1.7  PHOS  --   --   --  3.5 3.5 3.3   GFR: Estimated Creatinine Clearance: 112.5 mL/min (by C-G formula based on SCr of 0.81 mg/dL). Liver Function Tests: No results for input(s): AST, ALT, ALKPHOS, BILITOT, PROT, ALBUMIN in the last 168 hours. No results for input(s): LIPASE, AMYLASE in the last 168 hours.  Recent Labs Lab 03/03/17 1800 03/07/17 1400  AMMONIA 27 20   Coagulation Profile: No results for input(s): INR, PROTIME in the last 168 hours. Cardiac Enzymes: No results for input(s): CKTOTAL, CKMB, CKMBINDEX, TROPONINI in the last 168 hours. BNP (last 3 results) No results for input(s): PROBNP in the last 8760 hours. HbA1C: No results for input(s): HGBA1C in the last 72 hours. CBG: No results for input(s): GLUCAP in the last 168 hours. Lipid Profile: No results for input(s): CHOL, HDL, LDLCALC, TRIG, CHOLHDL, LDLDIRECT in the last 72 hours. Thyroid Function Tests: No results for input(s): TSH, T4TOTAL, FREET4, T3FREE, THYROIDAB in the last 72 hours. Anemia Panel: No results for input(s): VITAMINB12, FOLATE, FERRITIN, TIBC, IRON, RETICCTPCT  in the last 72 hours. Urine analysis:    Component Value Date/Time   COLORURINE AMBER (A) 03/04/2017 2127   APPEARANCEUR HAZY (A) 03/04/2017 2127   LABSPEC 1.018 03/04/2017 2127   PHURINE 6.0 03/04/2017 2127   GLUCOSEU NEGATIVE 03/04/2017 2127   HGBUR MODERATE (A) 03/04/2017 2127   BILIRUBINUR NEGATIVE 03/04/2017 2127   KETONESUR 20 (A) 03/04/2017 2127   PROTEINUR 100 (A) 03/04/2017 2127   UROBILINOGEN 4.0 (H) 06/02/2012 1200   NITRITE NEGATIVE 03/04/2017 2127   LEUKOCYTESUR SMALL (A) 03/04/2017 2127     Pecolia Marando M.D. [facility] 03/10/2017, 2:25 PM  Pager: [phone] Between 7am to 7pm - call Pager - [phone]  After 7pm go to [email or web address] - password [email or web address]  Call night coverage person covering after 7pm

## 2017-03-11 ENCOUNTER — Inpatient Hospital Stay (HOSPITAL_COMMUNITY): Payer: Medicare HMO

## 2017-03-11 DIAGNOSIS — J80 Acute respiratory distress syndrome: Secondary | ICD-10-CM

## 2017-03-11 LAB — PHOSPHORUS: PHOSPHORUS: 2.6 mg/dL (ref 2.5–4.6)

## 2017-03-11 LAB — ECHOCARDIOGRAM COMPLETE
HEIGHTINCHES: 70 in
Weight: 4288 oz

## 2017-03-11 LAB — MAGNESIUM: MAGNESIUM: 1.7 mg/dL (ref 1.7–2.4)

## 2017-03-11 MED ORDER — FUROSEMIDE 10 MG/ML IJ SOLN
40.0000 mg | Freq: Two times a day (BID) | INTRAMUSCULAR | Status: DC
  Administered 2017-03-12 – 2017-03-15 (×3): 40 mg via INTRAVENOUS
  Filled 2017-03-11 (×3): qty 4

## 2017-03-11 MED ORDER — TRAMADOL HCL 50 MG PO TABS
50.0000 mg | ORAL_TABLET | Freq: Four times a day (QID) | ORAL | Status: AC | PRN
  Administered 2017-03-11 – 2017-03-12 (×2): 50 mg via ORAL
  Filled 2017-03-11 (×2): qty 1

## 2017-03-11 MED ORDER — PERFLUTREN LIPID MICROSPHERE
1.0000 mL | INTRAVENOUS | Status: AC | PRN
  Administered 2017-03-11: 2 mL via INTRAVENOUS
  Filled 2017-03-11: qty 10

## 2017-03-11 NOTE — Clinical Social Work Note (Signed)
CSW spoke with patient's daughter, Liliane Shi, who confirmed continued interest in patient returning to [facility] when stable. Clinicals were faxed to [facility] yesterday for review.  Charlynn Court, CSW [phone]

## 2017-03-11 NOTE — Progress Notes (Addendum)
Nutrition Follow-up  DOCUMENTATION CODES:   Morbid obesity  INTERVENTION:   Continue:   Ensure Enlive PO BID, each supplement provides 350 kcal and 20 grams of protein  Pro-stat 30 ml PO BID, each supplement provides 100  Kcal and 15 grams protein  NUTRITION DIAGNOSIS:   Increased nutrient needs related to wound healing as evidenced by estimated needs.  Ongoing  GOAL:   Patient will meet greater than or equal to 90% of their needs  Progressing  MONITOR:   PO intake, Supplement acceptance, Labs, Weight trends, Skin, I & O's  ASSESSMENT:   71 yo Male with PMH of diastolic heart failure, HTN, chronic anemia, chronic pain, chronic venous insufficiency, chronic edema, cirrhosis of the liver by CT; admitted with altered mental status, probable sepsis, encephalopathy, started on broad-spectrum antibiotics.  Patient reports ongoing poor intake.  Meal completion 0-50% of most meals. He likes Ensure and Pro-stat supplements. He requests 4 cartons of milk per meal, but RN reports she has been providing extra milk when he asks for it, then he does not drink it. Patient somewhat confused per discussion with RN. Labs and medications reviewed.  Diet Order:  Diet Heart Room service appropriate? Yes; Fluid consistency: Thin  Skin:  Wound (see comment) (multiple wounds)  Stage IV sacrum Partial thickness to buttocks, bilateral pretibial and calves, L lat foot, L inner thigh Unstageable to bilateral heels and lateral plantar surface of R foot DTI to L medial heel  Last BM:  4/4  Height:   Ht Readings from Last 1 Encounters:  02/24/17  (1.778 m)    Weight:   Wt Readings from Last 1 Encounters:  03/11/17 268 lb (121.6 kg)    Ideal Body Weight:  75.4 kg  BMI:  Body mass index is 38.45 kg/m.  Estimated Nutritional Needs:   Kcal:  2100-2300  Protein:  115-125 gm  Fluid:  2.1-2.3 L  EDUCATION NEEDS:   No education needs identified at this time  Joaquin Courts, RD, LDN, CNSC Pager [phone] After Hours Pager [phone]

## 2017-03-11 NOTE — Progress Notes (Addendum)
[facility]                                                                              Patient Demographics  Jesus [name], is a 71 y.o. male, DOB - 1945-12-17, ZOX:[id number]  Admit date - 02/24/2017   Admitting Physician Michael Litter, MD  Outpatient Primary MD for the patient is [name],[name], [name]  Outpatient specialists:   LOS - 15  days    Chief Complaint  Patient presents with  . Altered Mental Status       Brief summary   71 y.o.male with a history of chronic atrial fibrillation (anticoagulated with Eliquis; CHADS-Vasc score of 4), CKD 3, chronic diastolic heart failure, HTN, chronic anemia, chronic pain, chronic venous insufficiency, chronic edema, cirrhosis of the liver by CT, incidental lesions on his liver and pancreas seen on prior CT imaging. He was recently hospitalized from 1/22-2/16 for aspiration pneumonia and infected sacral decubitus ulcer requiring surgical debridement. The wound ultimately required a wound vac and he was referred to [facility]. According to his daughter, he did well there, but has declined quickly since being back at [facility] for less than one week. Patient found to have altered mental status and probable sepsis.  Patient was found to have acute hypoxic and hypercarbic respiratory failure, initially admitted to stepdown unit, subsequently yesterday status improved and he was transferred to floor. However on 3/27, patient was again noticed to be lethargic and PCO2 >100 on ABG. He was transferred to stepdown unit and placed on BiPAP. Patient was noticed to have mental status changes and respiratory failure every time he was weaned off of BiPAP. Pulmonology consult was obtained and recommended continuing BiPAP QHS. He was also placed on Lasix for diuresis. Plan for DC to skilled nursing when improved   Assessment & Plan   Acute hypoxic and hypercarbic respiratory failure / Acute pulmonary edema - CXR on admission showed CHF with pulmonary  interstitial edema and small right pleural effusion - On admission, patient was placed on BiPAP, subsequently his respiratory status was improved and patient was transferred to the floor, then on 3/27, patient was noticed to be lethargic, not following commands, had received pain medication in the morning but did not respond to Narcan. ABG showed pH of 7.166 pCO2 102, pO2 84.6. Patient was transferred back to stepdown unit and placed on BiPAP (DNR status). - May have underlying OSA or obesity hypoventilation, will need outpatient sleep study to qualify for CPAP. - Palliative medicine also reconsulted for goals of care - Patient was placed on BiPAP 3/30 hypoxic respiratory failure. He remained on BiPAP overnight however still lethargic, hence PCCM consulted, recommended aggressive BiPAP qhs and prn during the day, Lasix for diuresis - Patient has been doing well with BiPAP qhs, he will need to be discharged on BiPAP QHS (settings 10/5. Rate 12, 3-4L O2 flow, Fio2 30%) at skilled nursing facility -for now continues on IV lasix-change to  q12 -s/p Palliative meeting, DNR and plan for DC to SNF when stable with Palliative follow up  Acute metabolic encephalopathy likely due to #1, underlying dementia  - Ammonia level was normal, - EEG  showed mild to moderate diffuse slowing, no seizures - MRI was canceled due to patient's agitation. Neurology signed off, recommended no further work-up.  Acute on Chronic diastolic heart failure - 2 D ECHO in 12/2014 showed EF 55-60% - Continue IV Lasix for diuresis,  q 12 - negative 10.7L  Sepsissecondary to catheter associated Providenicia stuartii UTI as well as cellulitis and superimposed infection involving chronic nonhealing sacral decubitus ulcer, both present on admission. - Urine cx grew Providencia, Blood cx so far showed no growth - Pt was on vanco and zosyn but this was narrowed down to Rocephin 3/22, until 3/31  - Sepsis physiology has  resolved - temp 99 range now, monitor  Mood disorder - Continue Depakote   Chronic atrial fibrillation / Coagulopathy  - CHADS vasc score 4 - Continue anticoagulation with apixaban - Rate controlled without beta blockers  Anemia of chronic disease - Due to bone marrow suppression from history of liver cirrhosis - Has had 2 units PRBC transfusion on 3/22 - transfuse for hemoglobin less than 7.5  Diarrhea - C. difficile negative - No diarrhea in past   Hypokalemia - Due to sepsis, diarrhea, Currently stable  Liver cirrhosis - Continue lactulose  - Continue protonix   Multiple wounds, sacral wounds, stage 4 sacral decubitus ulcer - Stage IV sacrum, partial thickness buttocks, bilateral pretibial and calves, left lat foot, left inner thigh. Unstageable to bilateral heels, and lateral plantar surface of R foot, DTI to L medial heel - Sacrum 15cm x 18cm x 6cm deep stage 4 - Buttocks with multiple various size partial thickness wounds - Unstageable on Lateral plantar surface of R foot 1.5cm x 2cm x 0cm  - Unstageable R heel 2.5cm x 6cm x 0cm - R lateral calf 6cm x 3cm x 0.1cm partial thickness 5 - Proximal to right ankle 2.5cm x 0.5cm x 0cm dark purple.  - Two intact blisters to inner left thigh 6cm x 1.5cm and 3cm x 1cm - Left lateral calf 6cm x 3cm x 0.1cm partial thickness and one 3cm  - Left medial heel 2.5cm x 2.5cm  - Left lateral foot 4cm x 4cm and a 2cm x 4cm  - Left posterior calf with 4cm x 3cm x 0cm  - Dressing procedure/placement/frequency: NS moistened W to D dressing BID for sacral ulcer. Xeroform and foam to wounds on heels, foam to intact blisters, xeroform and gauze to partial thickness on BLE calves. Pt is already on a specialty bed. Pt already has pressure alleviating boots on.   Code Status: DNR  DVT Prophylaxis:  apixaban Family Communication: no family at bedside today.   Disposition Plan: will need SNF. Will definitely need BiPAP qhs, when more  stable, mentation improved  Time Spent in minutes   25  minutes  Procedures:  BiPAP  Consultants:   Palliative care Neurology  Pulmonary critical care  Antimicrobials:   Rocephin 3/22-> 3/31  Vanco and zosyn 3/20 --> 3/22    Medications  Scheduled Meds: . apixaban  5 mg Oral BID  . chlorhexidine  15 mL Mouth Rinse BID  . divalproex  250 mg Oral Q12H  . feeding supplement (ENSURE ENLIVE)  237 mL Oral BID BM  . feeding supplement (PRO-STAT SUGAR FREE 64)  30 mL Oral BID  . furosemide  40 mg Intravenous Q8H  . lactulose  20 g Oral BID  . mouth rinse  15 mL Mouth Rinse BID  . memantine  5 mg Oral BID  . pantoprazole  40 mg Oral QHS  . sodium chloride flush  10-40 mL Intracatheter Q12H   Continuous Infusions:  PRN Meds:.acetaminophen **OR** acetaminophen, haloperidol lactate, HYDROcodone-acetaminophen, hydrocortisone cream, ondansetron **OR** ondansetron (ZOFRAN) IV, sodium chloride flush   Antibiotics   Anti-infectives    Start     Dose/Rate Route Frequency Ordered Stop   02/27/17 1400  cefTRIAXone (ROCEPHIN) 1 g in dextrose 5 % 50 mL IVPB  Status:  Discontinued     1 g 100 mL/hr over 30 Minutes Intravenous Every 24 hours 02/27/17 1245 03/08/17 0926   02/26/17 2000  vancomycin (VANCOCIN) IVPB 1000 mg/200 mL premix  Status:  Discontinued     1,000 mg 200 mL/hr over 60 Minutes Intravenous Every 24 hours 02/26/17 0809 02/27/17 1245   02/25/17 0800  vancomycin (VANCOCIN) IVPB 1000 mg/200 mL premix  Status:  Discontinued     1,000 mg 200 mL/hr over 60 Minutes Intravenous Every 12 hours 02/24/17 2102 02/26/17 0809   02/25/17 0600  piperacillin-tazobactam (ZOSYN) IVPB 3.375 g  Status:  Discontinued     3.375 g 12.5 mL/hr over 240 Minutes Intravenous Every 8 hours 02/24/17 2102 02/27/17 1245   02/24/17 1930  vancomycin (VANCOCIN) 2,000 mg in sodium chloride 0.9 % 500 mL IVPB     2,000 mg 250 mL/hr over 120 Minutes Intravenous  Once 02/24/17 1915 02/24/17 2201    02/24/17 1915  piperacillin-tazobactam (ZOSYN) IVPB 3.375 g     3.375 g 100 mL/hr over 30 Minutes Intravenous  Once 02/24/17 1907 02/24/17 2359   02/24/17 1915  vancomycin (VANCOCIN) IVPB 1000 mg/200 mL premix  Status:  Discontinued     1,000 mg 200 mL/hr over 60 Minutes Intravenous  Once 02/24/17 1907 02/24/17 1915   02/24/17 1815  cefTRIAXone (ROCEPHIN) injection 1 g  Status:  Discontinued     1 g Intramuscular  Once 02/24/17 1801 02/24/17 1907        Subjective:   Javares Golson was seen and examined today. SLEEPY, no complaints,   Objective:   Vitals:   03/11/17 0355 03/11/17 0737 03/11/17 0746 03/11/17 1244  BP:  113/68  138/73  Pulse: 93 (!) 120  73  Resp: (!) 34 (!) 30  (!) 36  Temp:  99.2 F (37.3 C)    TempSrc:  Axillary    SpO2:  100% 100% 100%  Weight:      Height:        Intake/Output Summary (Last 24 hours) at 03/11/17 1344 Last data filed at 03/11/17 1000  Gross per 24 hour  Intake              360 ml  Output             2575 ml  Net            -2215 ml     Wt Readings from Last 3 Encounters:  03/11/17 121.6 kg (268 lb)  02/17/17 136.1 kg (300 lb)  02/16/17 (!) 136.2 kg (300 lb 3.2 oz)     Exam  General: somnolent, drowsy, no distress, NAD  Neck: Supple, no JVD,   Cardiovascular: S1 S2 clear, RRR  Respiratory: Clear to auscultation bilaterally anteriorly, no wheezing  Gastrointestinal: Morbidly obese, Soft, nontender, nondistended, + bowel sounds  Ext: no cyanosis clubbing   Neuro: obtunded  Skin: Pressure boots on bilaterally on the heels  Psych: unable to assess   Data Reviewed:  I have personally reviewed following labs and imaging studies  Micro Results Recent Results (from  the past 240 hour(s))  Culture, Urine     Status: None   Collection Time: 03/04/17  9:27 PM  Result Value Ref Range Status   Specimen Description URINE, RANDOM  Final   Special Requests NONE  Final   Culture NO GROWTH  Final   Report Status  03/06/2017 FINAL  Final    Radiology Reports Ct Chest Wo Contrast  Result Date: 03/07/2017 CLINICAL DATA:  Acute on chronic respiratory failure. Hypoxia and hypercapnia. History of CHF, atrial fibrillation, pulmonary vascular congestion, sepsis. EXAM: CT CHEST WITHOUT CONTRAST TECHNIQUE: Multidetector CT imaging of the chest was performed following the standard protocol without IV contrast. COMPARISON:  Chest radiograph March 07, 2017 at 1523 hours FINDINGS: Cardiovascular: The heart is moderately enlarged. No pericardial effusions. Severe coronary artery calcifications. Ascending aorta is 4.3 by 4.7 cm in transaxial dimension with moderate calcific atherosclerosis. RIGHT PICC distal tip in the upper superior vena cava. Mediastinum/Nodes: No lymphadenopathy by CT size criteria though sensitivity decreased without intravenous contrast. Lungs/Pleura: Small to moderate RIGHT and small LEFT layering pleural effusions with underlying consolidation. Scattered calcified granulomata. Patchy to RIGHT middle lobe, RIGHT lower lobe consolidation with air bronchograms. Heterogeneous lung attenuation. No pulmonary masses, limited assessment for pulmonary nodule due to respiratory motion and habitus. Upper Abdomen: Nonacute. Streak artifact through upper abdomen, patient was scanned with arms at side. 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 Old RIGHT rib fractures. Moderate degenerative change of thoracic spine. IMPRESSION: Small to moderate RIGHT and small LEFT pleural effusions. RIGHT middle and RIGHT lower lobe suspected pneumonia. Cardiomegaly.  Findings of small airway disease. **An incidental finding of potential clinical significance has been found. 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 Ascending thoracic aortic aneurysm. Recommend semi-annual imaging followup by CTA or MRA and referral to cardiothoracic surgery if not already obtained. This recommendation follows 2010  ACCF/AHA/AATS/ACR/ASA/SCA/SCAI/SIR/STS/SVM Guidelines for the Diagnosis and Management of Patients With Thoracic Aortic Disease. Circulation. 2010; 121: Z610-R604** Electronically Signed   By: Awilda Metro M.D.   On: 03/07/2017 22:01   Dg Chest Port 1 View  Result Date: 03/07/2017 CLINICAL DATA:  Respiratory failure, history CHF, hypertension, chronic atrial fibrillation, morbid obesity EXAM: PORTABLE CHEST 1 VIEW COMPARISON:  Portable exam 1523 hours compared to 02/26/2017 FINDINGS: RIGHT arm PICC line tip projects over SVC. Enlargement of cardiac silhouette with pulmonary vascular congestion. Increased pulmonary infiltrates particularly RIGHT with minimal RIGHT pleural effusion, likely representing pulmonary edema and CHF. No pneumothorax. Visualized osseous structures unremarkable. IMPRESSION: Enlargement of cardiac silhouette with pulmonary vascular congestion and probable mild pulmonary edema/CHF. Electronically Signed   By: Ulyses Southward M.D.   On: 03/07/2017 15:40   Dg Chest Port 1 View  Result Date: 02/26/2017 CLINICAL DATA:  PICC line. EXAM: PORTABLE CHEST 1 VIEW COMPARISON:  02/24/2017 . FINDINGS: PICC line noted with tip over the cavoatrial junction. Cardiomegaly with pulmonary interstitial prominence. Small right pleural effusion. No pneumothorax . IMPRESSION: 1. PICC line noted with tip projected over the cavoatrial junction. 2. Congestive heart failure with pulmonary interstitial edema and small right pleural effusion . Electronically Signed   By: Maisie Fus  Register   On: 02/26/2017 11:37   Dg Chest Port 1 View  Result Date: 02/24/2017 CLINICAL DATA:  Altered mental status EXAM: PORTABLE CHEST 1 VIEW COMPARISON:  January 20, 2017 FINDINGS: There is persistent cardiomegaly. There is pulmonary venous hypertension. There is no edema or consolidation. No adenopathy. No bone lesions. IMPRESSION: Pulmonary vascular congestion without edema or consolidation. Electronically Signed  By: Bretta Bang III M.D.   On: 02/24/2017 15:08    Lab Data:  CBC:  Recent Labs Lab 03/05/17 0413 03/06/17 1610 03/07/17 0352 03/08/17 0533 03/09/17 0530  WBC 7.3 6.0 5.5 4.9 5.6  HGB 8.4* 8.0* 7.5* 7.4* 8.4*  HCT 28.1* 26.9* 25.5* 24.2* 27.4*  MCV 95.3 95.4 96.2 95.3 93.8  PLT 264 259 274 244 297   Basic Metabolic Panel:  Recent Labs Lab 03/05/17 0413 03/06/17 0608 03/07/17 0352 03/08/17 0533 03/09/17 0530 03/10/17 0538 03/11/17 0312  NA 142 141 142 137 137  --   --   K 4.0 3.5 3.7 3.5 3.5  --   --   CL 102 101 101 95* 92*  --   --   CO2 32 34* 34* 35* 35*  --   --   GLUCOSE 100* 117* 101* 85 77  --   --   BUN --   --   CREATININE 0.92 0.83 0.79 0.77 0.81  --   --   CALCIUM 9.2 9.0 9.0 8.6* 8.5*  --   --   MG  --   --   --  2.0 1.8 1.7 1.7  PHOS  --   --   --  3.5 3.5 3.3 2.6   GFR: Estimated Creatinine Clearance: 110.9 mL/min (by C-G formula based on SCr of 0.81 mg/dL). Liver Function Tests: No results for input(s): AST, ALT, ALKPHOS, BILITOT, PROT, ALBUMIN in the last 168 hours. No results for input(s): LIPASE, AMYLASE in the last 168 hours.  Recent Labs Lab 03/07/17 1400  AMMONIA 20   Coagulation Profile: No results for input(s): INR, PROTIME in the last 168 hours. Cardiac Enzymes: No results for input(s): CKTOTAL, CKMB, CKMBINDEX, TROPONINI in the last 168 hours. BNP (last 3 results) No results for input(s): PROBNP in the last 8760 hours. HbA1C: No results for input(s): HGBA1C in the last 72 hours. CBG: No results for input(s): GLUCAP in the last 168 hours. Lipid Profile: No results for input(s): CHOL, HDL, LDLCALC, TRIG, CHOLHDL, LDLDIRECT in the last 72 hours. Thyroid Function Tests: No results for input(s): TSH, T4TOTAL, FREET4, T3FREE, THYROIDAB in the last 72 hours. Anemia Panel: No results for input(s): VITAMINB12, FOLATE, FERRITIN, TIBC, IRON, RETICCTPCT in the last 72 hours. Urine analysis:    Component Value Date/Time    COLORURINE AMBER (A) 03/04/2017 2127   APPEARANCEUR HAZY (A) 03/04/2017 2127   LABSPEC 1.018 03/04/2017 2127   PHURINE 6.0 03/04/2017 2127   GLUCOSEU NEGATIVE 03/04/2017 2127   HGBUR MODERATE (A) 03/04/2017 2127   BILIRUBINUR NEGATIVE 03/04/2017 2127   KETONESUR 20 (A) 03/04/2017 2127   PROTEINUR 100 (A) 03/04/2017 2127   UROBILINOGEN 4.0 (H) 06/02/2012 1200   NITRITE NEGATIVE 03/04/2017 2127   LEUKOCYTESUR SMALL (A) 03/04/2017 2127     Suzette Flagler M.D. [facility] 03/11/2017, 1:44 PM  Pager: [phone] Between 7am to 7pm - text page via AMION - [phone]  After 7pm go to [email or web address] - password [email or web address], Call night coverage person covering after 7pm

## 2017-03-11 NOTE — Progress Notes (Addendum)
  Echocardiogram 2D Echocardiogram with definity has been performed.  Jesus [name] L Androw 03/11/2017, 10:37 AM

## 2017-03-11 NOTE — Progress Notes (Signed)
RT NOTE:  Pt placed on BIPAP for bedtime. Pt tolerating well at this time. RT will monitor throughout night.

## 2017-03-12 ENCOUNTER — Inpatient Hospital Stay (HOSPITAL_COMMUNITY): Payer: Medicare HMO

## 2017-03-12 LAB — URINALYSIS, ROUTINE W REFLEX MICROSCOPIC
BILIRUBIN URINE: NEGATIVE
Glucose, UA: NEGATIVE mg/dL
KETONES UR: NEGATIVE mg/dL
Nitrite: NEGATIVE
PROTEIN: 100 mg/dL — AB
Specific Gravity, Urine: 1.018 (ref 1.005–1.030)
pH: 5 (ref 5.0–8.0)

## 2017-03-12 MED ORDER — DEXTROSE 5 % IV SOLN
1.0000 g | INTRAVENOUS | Status: DC
  Administered 2017-03-12 – 2017-03-15 (×3): 1 g via INTRAVENOUS
  Filled 2017-03-12 (×5): qty 10

## 2017-03-12 NOTE — Clinical Social Work Note (Addendum)
CSW called [employer] authorization line and authorization is not obtained or pending. CSW left voicemail for Artist at [facility] and faxed clinicals again.  Charlynn Court, CSW [phone]  1:56 pm CSW has not received a call back. CSW Soil scientist and left another voicemail. CSW also called and left a voicemail for the nurse at [facility] that was last handling his authorization.  Charlynn Court, CSW [phone]  2:24 pm Received call back from [facility]. Case worker is finishing up another patient's authorization then will start on this patient's.  Charlynn Court, CSW [phone]  2:39 pm Authorization obtained: [id number]  Charlynn Court, CSW [phone]

## 2017-03-12 NOTE — Progress Notes (Signed)
Palliative:  Officially signing off. Have discussed with multiple family members. They have our contact information and will contact us when ready for more comfort focuses care. Thank you for this consult. We will be happy to work with pt/family more in the future as needed.   No charge  Yong Channel, NP Palliative Medicine Team Pager # [phone] (M-F 8a-5p) Team Phone # [phone] (Nights/Weekends)

## 2017-03-12 NOTE — Consult Note (Signed)
   [facility] CM Inpatient Consult   03/12/2017  Jesus [name] [birth year] [id number]    Patient screened for potential [facility] Care Management services. Chart reviewed. Noted current discharge plan currently is for SNF.  There are no identifiable [facility] Care Management needs at this time. If patient's post hospital needs change, please place a [facility] Care Management consult. For questions please contact:  Raiford Noble, MSN-Ed, RN,BSN [facility] Liaison [phone]

## 2017-03-12 NOTE — Progress Notes (Signed)
[facility]                                                                           Patient Demographics Jesus [name], is a 71 y.o. male, DOB - October 29, 1946, NWG:[id number] Admit date - 02/24/2017   Admitting Physician Michael Litter, MD Outpatient Primary MD for the patient is [name],[name], [name] Outpatient specialists:  LOS - 16  days  Chief Complaint  Patient presents with  . Altered Mental Status      Brief summary   70 y.o.male with a history of chronic atrial fibrillation (anticoagulated with Eliquis; CHADS-Vasc score of 4), CKD 3, chronic diastolic heart failure, HTN, chronic anemia, chronic pain, chronic venous insufficiency, chronic edema, cirrhosis of the liver by CT, incidental lesions on his liver and pancreas seen on prior CT imaging. He was recently hospitalized from 1/22-2/16 for aspiration pneumonia and infected sacral decubitus ulcer requiring surgical debridement. The wound ultimately required a wound vac and he was referred to [facility]. According to his daughter, he did well there, but has declined quickly since being back at [facility] for less than one week. Patient found to have altered mental status and probable sepsis.  Patient was found to have acute hypoxic and hypercarbic respiratory failure, initially admitted to stepdown unit, subsequently yesterday status improved and he was transferred to floor. However on 3/27, patient was again noticed to be lethargic and PCO2 >100 on ABG. He was transferred to stepdown unit and placed on BiPAP. Patient was noticed to have mental status changes and respiratory failure every time he was weaned off of BiPAP. Pulmonology consult was obtained and recommended continuing BiPAP QHS. He was also placed on Lasix for diuresis. Plan for DC to skilled nursing when improved   Assessment & Plan   Acute hypoxic and hypercarbic respiratory failure / Acute pulmonary edema - CXR on admission showed CHF with pulmonary interstitial edema and  small right pleural effusion - On admission, patient was placed on BiPAP, subsequently his respiratory status was improved and patient was transferred to the floor, then on 3/27, patient was noticed to be lethargic, not following commands, had received pain medication in the morning but did not respond to Narcan. ABG showed pH of 7.166 pCO2 102, pO2 84.6. Patient was transferred back to stepdown unit and placed on BiPAP (DNR status). - May have underlying OSA or obesity hypoventilation, will need outpatient sleep study to qualify for CPAP. - Palliative medicine also reconsulted for goals of care - Patient was placed on BiPAP 3/30 hypoxic respiratory failure. He remained on BiPAP overnight however still lethargic, hence PCCM consulted, recommended aggressive BiPAP qhs and prn during the day, Lasix for diuresis - Patient has been doing well with BiPAP qhs, he will need to be discharged on BiPAP QHS (settings 10/5. Rate 12, 3-4L O2 flow, Fio2 30%) at skilled nursing facility -for now continues on IV lasix-change to  q12 -s/p Palliative meeting, DNR and plan for DC to SNF when stable with Palliative follow up  Acute metabolic encephalopathy likely due to #1, underlying dementia  - Ammonia level was normal, - EEG showed mild to moderate diffuse slowing, no seizures - MRI was  canceled due to patient's agitation. Neurology signed off, recommended no further work-up.  Fever: -temp in 100 range -DC foley and change, check UA -check Blood Cx -check CXR  Acute on Chronic diastolic heart failure - 2 D ECHO in 12/2014 showed EF 55-60% - Continue IV Lasix for diuresis, 40mg  q 12 - negative 11.9L  Sepsissecondary to catheter associated Providenicia stuartii UTI as well as cellulitis and superimposed infection involving chronic nonhealing sacral decubitus ulcer, both present on admission. - Urine cx grew Providencia, Blood cx so far showed no growth - Pt was on vanco and zosyn but this was narrowed  down to Rocephin 3/22, until 3/31  - Sepsis physiology has resolved  Mood disorder - Continue Depakote   Chronic atrial fibrillation / Coagulopathy  - CHADS vasc score 4 - Continue anticoagulation with apixaban - Rate controlled without beta blockers  Anemia of chronic disease - Due to bone marrow suppression from history of liver cirrhosis - Has had 2 units PRBC transfusion on 3/22 - transfuse for hemoglobin less than 7.5  Diarrhea - C. difficile negative - No diarrhea in past   Hypokalemia - Due to sepsis, diarrhea, Currently stable  Liver cirrhosis - Continue lactulose  - Continue protonix   Multiple wounds, sacral wounds, stage 4 sacral decubitus ulcer - Stage IV sacrum, partial thickness buttocks, bilateral pretibial and calves, left lat foot, left inner thigh. Unstageable to bilateral heels, and lateral plantar surface of R foot, DTI to L medial heel - Sacrum 15cm x 18cm x 6cm deep stage 4 - Buttocks with multiple various size partial thickness wounds - Unstageable on Lateral plantar surface of R foot 1.5cm x 2cm x 0cm  - Unstageable R heel 2.5cm x 6cm x 0cm - R lateral calf 6cm x 3cm x 0.1cm partial thickness 5 - Proximal to right ankle 2.5cm x 0.5cm x 0cm dark purple.  - Two intact blisters to inner left thigh 6cm x 1.5cm and 3cm x 1cm - Left lateral calf 6cm x 3cm x 0.1cm partial thickness and one 3cm  - Left medial heel 2.5cm x 2.5cm  - Left lateral foot 4cm x 4cm and a 2cm x 4cm  - Left posterior calf with 4cm x 3cm x 0cm  - Dressing procedure/placement/frequency: NS moistened W to D dressing BID for sacral ulcer. Xeroform and foam to wounds on heels, foam to intact blisters, xeroform and gauze to partial thickness on BLE calves. Pt is already on a specialty bed. Pt already has pressure alleviating boots on.   Code Status: DNR  DVT Prophylaxis:  apixaban Family Communication: no family at bedside, called and d/w sister who was at bedside this  afternoon Disposition Plan: will need SNF. Will definitely need BiPAP qhs, when more stable, mentation improved  Time Spent in minutes   25  minutes  Procedures:  BiPAP  Consultants:   Palliative care Neurology  Pulmonary critical care  Antimicrobials:   Rocephin 3/22-> 3/31  Vanco and zosyn 3/20 --> 3/22    Medications  Scheduled Meds: . apixaban  5 mg Oral BID  . chlorhexidine  15 mL Mouth Rinse BID  . divalproex  250 mg Oral Q12H  . feeding supplement (ENSURE ENLIVE)  237 mL Oral BID BM  . feeding supplement (PRO-STAT SUGAR FREE 64)  30 mL Oral BID  . furosemide  40 mg Intravenous Q12H  . lactulose  20 g Oral BID  . mouth rinse  15 mL Mouth Rinse BID  . memantine  5  mg Oral BID  . pantoprazole  40 mg Oral QHS  . sodium chloride flush  10-40 mL Intracatheter Q12H   Continuous Infusions:  PRN Meds:.acetaminophen **OR** acetaminophen, haloperidol lactate, hydrocortisone cream, ondansetron **OR** ondansetron (ZOFRAN) IV, sodium chloride flush   Antibiotics   Anti-infectives    Start     Dose/Rate Route Frequency Ordered Stop   02/27/17 1400  cefTRIAXone (ROCEPHIN) 1 g in dextrose 5 % 50 mL IVPB  Status:  Discontinued     1 g 100 mL/hr over 30 Minutes Intravenous Every 24 hours 02/27/17 1245 03/08/17 0926   02/26/17 2000  vancomycin (VANCOCIN) IVPB 1000 mg/200 mL premix  Status:  Discontinued     1,000 mg 200 mL/hr over 60 Minutes Intravenous Every 24 hours 02/26/17 0809 02/27/17 1245   02/25/17 0800  vancomycin (VANCOCIN) IVPB 1000 mg/200 mL premix  Status:  Discontinued     1,000 mg 200 mL/hr over 60 Minutes Intravenous Every 12 hours 02/24/17 2102 02/26/17 0809   02/25/17 0600  piperacillin-tazobactam (ZOSYN) IVPB 3.375 g  Status:  Discontinued     3.375 g 12.5 mL/hr over 240 Minutes Intravenous Every 8 hours 02/24/17 2102 02/27/17 1245   02/24/17 1930  vancomycin (VANCOCIN) 2,000 mg in sodium chloride 0.9 % 500 mL IVPB     2,000 mg 250 mL/hr over 120  Minutes Intravenous  Once 02/24/17 1915 02/24/17 2201   02/24/17 1915  piperacillin-tazobactam (ZOSYN) IVPB 3.375 g     3.375 g 100 mL/hr over 30 Minutes Intravenous  Once 02/24/17 1907 02/24/17 2359   02/24/17 1915  vancomycin (VANCOCIN) IVPB 1000 mg/200 mL premix  Status:  Discontinued     1,000 mg 200 mL/hr over 60 Minutes Intravenous  Once 02/24/17 1907 02/24/17 1915   02/24/17 1815  cefTRIAXone (ROCEPHIN) injection 1 g  Status:  Discontinued     1 g Intramuscular  Once 02/24/17 1801 02/24/17 1907        Subjective:   Jesus [name] was seen and examined today. More alert, low grade fevers last pm  Objective:   Vitals:   03/12/17 0500 03/12/17 0723 03/12/17 0755 03/12/17 1311  BP:   104/68 128/73  Pulse:  77 76 79  Resp:  Temp:   98.5 F (36.9 C) 98.1 F (36.7 C)  TempSrc:   Axillary Axillary  SpO2:  100% 100% 95%  Weight: 120.2 kg (265 lb)     Height:        Intake/Output Summary (Last 24 hours) at 03/12/17 1410 Last data filed at 03/12/17 0759  Gross per 24 hour  Intake              850 ml  Output             1000 ml  Net             -150 ml     Wt Readings from Last 3 Encounters:  03/12/17 120.2 kg (265 lb)  02/17/17 136.1 kg (300 lb)  02/16/17 (!) 136.2 kg (300 lb 3.2 oz)     Exam  General: somnolent, drowsy, no distress, NAD  Neck: Supple, no JVD,   Cardiovascular: S1 S2 clear, RRR  Respiratory: Clear to auscultation bilaterally anteriorly, no wheezing  Gastrointestinal: Morbidly obese, Soft, nontender, nondistended, + bowel sounds  Ext: no cyanosis clubbing   Neuro: obtunded  Skin: Pressure boots on bilaterally on the heels  Psych: unable to assess   Data Reviewed:  I have personally  reviewed following labs and imaging studies  Micro Results Recent Results (from the past 240 hour(s))  Culture, Urine     Status: None   Collection Time: 03/04/17  9:27 PM  Result Value Ref Range Status   Specimen Description URINE,  RANDOM  Final   Special Requests NONE  Final   Culture NO GROWTH  Final   Report Status 03/06/2017 FINAL  Final    Radiology Reports Dg Chest 1 View  Result Date: 03/12/2017 CLINICAL DATA:  Fever EXAM: CHEST 1 VIEW COMPARISON:  CT and chest radiography 1 week ago. FINDINGS: Chronic cardiomegaly. Chronic aortic atherosclerosis. Right arm PICC tip in the SVC at the azygos level. Left chest is clear except for minimal atelectasis in the lower lobe. Right chest shows hazy density with volume loss in the right lower lobe, probably related to a right effusion with right lower lobe volume loss. Appearance is improved compared to the previous studies. No worsening or new findings. Old rib fractures on the right. IMPRESSION: Radiographic improvement. Some persistent volume loss in the lower lobes, right more than left. Electronically Signed   By: Paulina Fusi M.D.   On: 03/12/2017 11:33   Ct Chest Wo Contrast  Result Date: 03/07/2017 CLINICAL DATA:  Acute on chronic respiratory failure. Hypoxia and hypercapnia. History of CHF, atrial fibrillation, pulmonary vascular congestion, sepsis. EXAM: CT CHEST WITHOUT CONTRAST TECHNIQUE: Multidetector CT imaging of the chest was performed following the standard protocol without IV contrast. COMPARISON:  Chest radiograph March 07, 2017 at 1523 hours FINDINGS: Cardiovascular: The heart is moderately enlarged. No pericardial effusions. Severe coronary artery calcifications. Ascending aorta is 4.3 by 4.7 cm in transaxial dimension with moderate calcific atherosclerosis. RIGHT PICC distal tip in the upper superior vena cava. Mediastinum/Nodes: No lymphadenopathy by CT size criteria though sensitivity decreased without intravenous contrast. Lungs/Pleura: Small to moderate RIGHT and small LEFT layering pleural effusions with underlying consolidation. Scattered calcified granulomata. Patchy to RIGHT middle lobe, RIGHT lower lobe consolidation with air bronchograms. Heterogeneous  lung attenuation. No pulmonary masses, limited assessment for pulmonary nodule due to respiratory motion and habitus. Upper Abdomen: Nonacute. Streak artifact through upper abdomen, patient was scanned with arms at side. 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 Old RIGHT rib fractures. Moderate degenerative change of thoracic spine. IMPRESSION: Small to moderate RIGHT and small LEFT pleural effusions. RIGHT middle and RIGHT lower lobe suspected pneumonia. Cardiomegaly.  Findings of small airway disease. **An incidental finding of potential clinical significance has been found. 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 Ascending thoracic aortic aneurysm. Recommend semi-annual imaging followup by CTA or MRA and referral to cardiothoracic surgery if not already obtained. This recommendation follows 2010 ACCF/AHA/AATS/ACR/ASA/SCA/SCAI/SIR/STS/SVM Guidelines for the Diagnosis and Management of Patients With Thoracic Aortic Disease. Circulation. 2010; 121: Z610-R604** Electronically Signed   By: Awilda Metro M.D.   On: 03/07/2017 22:01   Dg Chest Port 1 View  Result Date: 03/07/2017 CLINICAL DATA:  Respiratory failure, history CHF, hypertension, chronic atrial fibrillation, morbid obesity EXAM: PORTABLE CHEST 1 VIEW COMPARISON:  Portable exam 1523 hours compared to 02/26/2017 FINDINGS: RIGHT arm PICC line tip projects over SVC. Enlargement of cardiac silhouette with pulmonary vascular congestion. Increased pulmonary infiltrates particularly RIGHT with minimal RIGHT pleural effusion, likely representing pulmonary edema and CHF. No pneumothorax. Visualized osseous structures unremarkable. IMPRESSION: Enlargement of cardiac silhouette with pulmonary vascular congestion and probable mild pulmonary edema/CHF. Electronically Signed   By: Ulyses Southward M.D.   On: 03/07/2017 15:40   Dg Chest  Port 1 View  Result Date: 02/26/2017 CLINICAL DATA:  PICC line. EXAM: PORTABLE CHEST 1 VIEW  COMPARISON:  02/24/2017 . FINDINGS: PICC line noted with tip over the cavoatrial junction. Cardiomegaly with pulmonary interstitial prominence. Small right pleural effusion. No pneumothorax . IMPRESSION: 1. PICC line noted with tip projected over the cavoatrial junction. 2. Congestive heart failure with pulmonary interstitial edema and small right pleural effusion . Electronically Signed   By: Maisie Fus  Register   On: 02/26/2017 11:37   Dg Chest Port 1 View  Result Date: 02/24/2017 CLINICAL DATA:  Altered mental status EXAM: PORTABLE CHEST 1 VIEW COMPARISON:  January 20, 2017 FINDINGS: There is persistent cardiomegaly. There is pulmonary venous hypertension. There is no edema or consolidation. No adenopathy. No bone lesions. IMPRESSION: Pulmonary vascular congestion without edema or consolidation. Electronically Signed   By: Bretta Bang III M.D.   On: 02/24/2017 15:08    Lab Data:  CBC:  Recent Labs Lab 03/06/17 0608 03/07/17 0352 03/08/17 0533 03/09/17 0530  WBC 6.0 5.5 4.9 5.6  HGB 8.0* 7.5* 7.4* 8.4*  HCT 26.9* 25.5* 24.2* 27.4*  MCV 95.4 96.2 95.3 93.8  PLT 259 274 244 297   Basic Metabolic Panel:  Recent Labs Lab 03/06/17 0608 03/07/17 0352 03/08/17 0533 03/09/17 0530 03/10/17 0538 03/11/17 0312  NA 141 142 137 137  --   --   K 3.5 3.7 3.5 3.5  --   --   CL 101 101 95* 92*  --   --   CO2 34* 34* 35* 35*  --   --   GLUCOSE 117* 101* 85 77  --   --   BUN --   --   CREATININE 0.83 0.79 0.77 0.81  --   --   CALCIUM 9.0 9.0 8.6* 8.5*  --   --   MG  --   --  2.0 1.8 1.7 1.7  PHOS  --   --  3.5 3.5 3.3 2.6   GFR: Estimated Creatinine Clearance: 110.3 mL/min (by C-G formula based on SCr of 0.81 mg/dL). Liver Function Tests: No results for input(s): AST, ALT, ALKPHOS, BILITOT, PROT, ALBUMIN in the last 168 hours. No results for input(s): LIPASE, AMYLASE in the last 168 hours.  Recent Labs Lab 03/07/17 1400  AMMONIA 20   Coagulation Profile: No  results for input(s): INR, PROTIME in the last 168 hours. Cardiac Enzymes: No results for input(s): CKTOTAL, CKMB, CKMBINDEX, TROPONINI in the last 168 hours. BNP (last 3 results) No results for input(s): PROBNP in the last 8760 hours. HbA1C: No results for input(s): HGBA1C in the last 72 hours. CBG: No results for input(s): GLUCAP in the last 168 hours. Lipid Profile: No results for input(s): CHOL, HDL, LDLCALC, TRIG, CHOLHDL, LDLDIRECT in the last 72 hours. Thyroid Function Tests: No results for input(s): TSH, T4TOTAL, FREET4, T3FREE, THYROIDAB in the last 72 hours. Anemia Panel: No results for input(s): VITAMINB12, FOLATE, FERRITIN, TIBC, IRON, RETICCTPCT in the last 72 hours. Urine analysis:    Component Value Date/Time   COLORURINE AMBER (A) 03/12/2017 1238   APPEARANCEUR CLOUDY (A) 03/12/2017 1238   LABSPEC 1.018 03/12/2017 1238   PHURINE 5.0 03/12/2017 1238   GLUCOSEU NEGATIVE 03/12/2017 1238   HGBUR MODERATE (A) 03/12/2017 1238   BILIRUBINUR NEGATIVE 03/12/2017 1238   KETONESUR NEGATIVE 03/12/2017 1238   PROTEINUR 100 (A) 03/12/2017 1238   UROBILINOGEN 4.0 (H) 06/02/2012 1200   NITRITE NEGATIVE 03/12/2017 1238   LEUKOCYTESUR LARGE (  A) 03/12/2017 1238     Scott Fix M.D. [facility] 03/12/2017, 2:10 PM  Pager: [phone] Between 7am to 7pm - text page via AMION - [phone]  After 7pm go to [email or web address] - password [email or web address], Call night coverage person covering after 7pm

## 2017-03-13 LAB — GLUCOSE, CAPILLARY: Glucose-Capillary: 95 mg/dL (ref 65–99)

## 2017-03-13 MED ORDER — ALTEPLASE 2 MG IJ SOLR
2.0000 mg | Freq: Once | INTRAMUSCULAR | Status: AC
  Administered 2017-03-13: 2 mg

## 2017-03-13 NOTE — Progress Notes (Signed)
Nurse notified by CNA that patient had removed his PICC line.  Line appears to be intact.  Patient bleeding minimally pressure applied and dressing placed.  Dr. Jomarie Longs text paged that patient no longer has access, awaiting response.  Will continue to monitor.

## 2017-03-13 NOTE — Progress Notes (Signed)
Scratched patients back for 10 minutes per pt request. Patient resting comfortably. Will monitor

## 2017-03-13 NOTE — Progress Notes (Addendum)
At bedside to place PICC line noted bilateral upper arms with open and oozing blisters.  Also has multiple fluid filled blisters and skin tears bilateral arms.    PICC line not recommended for placement in upper arms due to above mentioned problems and increased chance of infection.   Recommend medications to be oral if possible or would refer to Interventional Radiology for placement of tunneled PICC/central line.   Primary RN made aware and will notify MD.  Thank you Vascular Access Specialist Team

## 2017-03-13 NOTE — Progress Notes (Addendum)
[facility]                                                                           Patient Demographics Jesus [name], is a 71 y.o. male, DOB - 1946/05/18, ZOX:[id number] Admit date - 02/24/2017   Admitting Physician Michael Litter, MD Outpatient Primary MD for the patient is [name],[name], [name] Outpatient specialists:  LOS - 17  days  Chief Complaint  Patient presents with  . Altered Mental Status      Brief summary   70 y.o.male with a history of chronic atrial fibrillation (anticoagulated with Eliquis; CHADS-Vasc score of 4), CKD 3, chronic diastolic heart failure, HTN, chronic anemia, chronic pain, chronic venous insufficiency, chronic edema, cirrhosis of the liver by CT, incidental lesions on his liver and pancreas seen on prior CT imaging. He was recently hospitalized from 1/22-2/16 for aspiration pneumonia and infected sacral decubitus ulcer requiring surgical debridement. The wound ultimately required a wound vac and he was referred to [facility] According to his daughter, he did well there, but has declined quickly since being back at [facility] for less than one week. Patient found to have altered mental status and probable sepsis.  Patient was found to have acute hypoxic and hypercarbic respiratory failure, initially admitted to stepdown unit, subsequently yesterday status improved and he was transferred to floor. However on 3/27, patient was again noticed to be lethargic and PCO2 >100 on ABG. He was transferred to stepdown unit and placed on BiPAP. Patient was noticed to have mental status changes and respiratory failure every time he was weaned off of BiPAP. Pulmonology consult was obtained and recommended continuing BiPAP QHS. He was also placed on Lasix for diuresis. Plan for DC to skilled nursing when improved   Assessment & Plan   Acute hypoxic and hypercarbic respiratory failure / Acute pulmonary edema - CXR on admission showed CHF with pulmonary interstitial edema and  small right pleural effusion -due to third spacing from low albumin/cirrhosis and diastolic CHF - On admission, patient was placed on BiPAP, subsequently his respiratory status was improved and patient was transferred to the floor, then on 3/27, patient was noticed to be lethargic, not following commands, had received pain medication in the morning but did not respond to Narcan. ABG showed pH of 7.166 pCO2 102, pO2 84.6. Patient was transferred back to stepdown unit and placed on BiPAP (DNR status). - May have underlying OSA or obesity hypoventilation, will need outpatient sleep study to qualify for CPAP. - Palliative medicine following, plan for SNF with Palliative care - Patient was placed on BiPAP again on 3/30 hypoxic respiratory failure. He remained on BiPAP overnight however still lethargic, hence PCCM consulted, recommended aggressive BiPAP qhs and prn during the day, Lasix for diuresis - Patient has been doing well with BiPAP qhs, he will need to be discharged on BiPAP QHS (settings 10/5. Rate 12, 3-4L O2 flow, Fio2 30%) at skilled nursing facility along with diuretics -s/p Palliative meeting, DNR and plan for DC to SNF when stable with Palliative follow up -continue IV lasix for now, has significant third spacing from hypoalbuminemia due to liver cirrhosis/malnutrition  Acute metabolic encephalopathy likely due to #  1, underlying dementia  - Ammonia level was normal, - EEG showed mild to moderate diffuse slowing, no seizures - MRI was canceled due to patient's agitation. Neurology signed off, recommended no further work-up. -mentation improved  UTI- -febrile and lethargic >24hours ago -changed foley catheter 4/5, UA suggestive of infection -continue Ceftriaxone, urine Cx pending  Acute on Chronic diastolic heart failure - 2 D ECHO in 12/2014 showed EF 55-60% - Continue IV Lasix for diuresis, 40mg  q 12 - negative 11.9L  Sepsissecondary to catheter associated Providenicia stuartii  UTI as well as cellulitis and superimposed infection involving chronic nonhealing sacral decubitus ulcer, both present on admission. - Urine cx grew Providencia, Blood cx so far showed no growth - Pt was on vanco and zosyn but this was narrowed down to Rocephin 3/22, until 3/31  - Sepsis physiology has resolved  Mood disorder - Continue Depakote   Chronic atrial fibrillation / Coagulopathy  - CHADS vasc score 4 - Continue anticoagulation with apixaban - Rate controlled without beta blockers  Anemia of chronic disease - Due to bone marrow suppression from history of liver cirrhosis - Has had 2 units PRBC transfusion on 3/22 - transfuse for hemoglobin less than 7.5  Diarrhea - C. difficile negative - No diarrhea in past   Hypokalemia - Due to sepsis, diarrhea, Currently stable  Liver cirrhosis - Continue lactulose  - Continue protonix   Multiple wounds, sacral wounds, stage 4 sacral decubitus ulcer - Stage IV sacrum, partial thickness buttocks, bilateral pretibial and calves, left lat foot, left inner thigh. Unstageable to bilateral heels, and lateral plantar surface of R foot, DTI to L medial heel - Sacrum 15cm x 18cm x 6cm deep stage 4 - Buttocks with multiple various size partial thickness wounds - Unstageable on Lateral plantar surface of R foot 1.5cm x 2cm x 0cm  - Unstageable R heel 2.5cm x 6cm x 0cm - R lateral calf 6cm x 3cm x 0.1cm partial thickness 5 - Proximal to right ankle 2.5cm x 0.5cm x 0cm dark purple.  - Two intact blisters to inner left thigh 6cm x 1.5cm and 3cm x 1cm - Left lateral calf 6cm x 3cm x 0.1cm partial thickness and one 3cm  - Left medial heel 2.5cm x 2.5cm  - Left lateral foot 4cm x 4cm and a 2cm x 4cm  - Left posterior calf with 4cm x 3cm x 0cm  - Dressing procedure/placement/frequency: NS moistened W to D dressing BID for sacral ulcer. Xeroform and foam to wounds on heels, foam to intact blisters, xeroform and gauze to partial  thickness on BLE calves. Pt is already on a specialty bed. Pt already has pressure alleviating boots on.   Code Status: DNR  DVT Prophylaxis:  apixaban Family Communication: no family at bedside, called and d/w sister who was at bedside 4/5 Disposition Plan: will need SNF. Will definitely need BiPAP qhs, when more stable  Time Spent in minutes   25  minutes  Procedures:  BiPAP  Consultants:   Palliative care Neurology  Pulmonary critical care  Antimicrobials:   Rocephin 3/22-> 3/31  Vanco and zosyn 3/20 --> 3/22    Medications  Scheduled Meds: . apixaban  5 mg Oral BID  . cefTRIAXone (ROCEPHIN)  IV  1 g Intravenous Q24H  . chlorhexidine  15 mL Mouth Rinse BID  . divalproex  250 mg Oral Q12H  . feeding supplement (ENSURE ENLIVE)  237 mL Oral BID BM  . feeding supplement (PRO-STAT SUGAR FREE 64)  30 mL Oral BID  . furosemide  40 mg Intravenous Q12H  . lactulose  20 g Oral BID  . mouth rinse  15 mL Mouth Rinse BID  . memantine  5 mg Oral BID  . pantoprazole  40 mg Oral QHS  . sodium chloride flush  10-40 mL Intracatheter Q12H   Continuous Infusions:  PRN Meds:.acetaminophen **OR** acetaminophen, haloperidol lactate, hydrocortisone cream, ondansetron **OR** ondansetron (ZOFRAN) IV, sodium chloride flush   Antibiotics   Anti-infectives    Start     Dose/Rate Route Frequency Ordered Stop   03/12/17 1500  cefTRIAXone (ROCEPHIN) 1 g in dextrose 5 % 50 mL IVPB     1 g 100 mL/hr over 30 Minutes Intravenous Every 24 hours 03/12/17 1411     02/27/17 1400  cefTRIAXone (ROCEPHIN) 1 g in dextrose 5 % 50 mL IVPB  Status:  Discontinued     1 g 100 mL/hr over 30 Minutes Intravenous Every 24 hours 02/27/17 1245 03/08/17 0926   02/26/17 2000  vancomycin (VANCOCIN) IVPB 1000 mg/200 mL premix  Status:  Discontinued     1,000 mg 200 mL/hr over 60 Minutes Intravenous Every 24 hours 02/26/17 0809 02/27/17 1245   02/25/17 0800  vancomycin (VANCOCIN) IVPB 1000 mg/200 mL premix   Status:  Discontinued     1,000 mg 200 mL/hr over 60 Minutes Intravenous Every 12 hours 02/24/17 2102 02/26/17 0809   02/25/17 0600  piperacillin-tazobactam (ZOSYN) IVPB 3.375 g  Status:  Discontinued     3.375 g 12.5 mL/hr over 240 Minutes Intravenous Every 8 hours 02/24/17 2102 02/27/17 1245   02/24/17 1930  vancomycin (VANCOCIN) 2,000 mg in sodium chloride 0.9 % 500 mL IVPB     2,000 mg 250 mL/hr over 120 Minutes Intravenous  Once 02/24/17 1915 02/24/17 2201   02/24/17 1915  piperacillin-tazobactam (ZOSYN) IVPB 3.375 g     3.375 g 100 mL/hr over 30 Minutes Intravenous  Once 02/24/17 1907 02/24/17 2359   02/24/17 1915  vancomycin (VANCOCIN) IVPB 1000 mg/200 mL premix  Status:  Discontinued     1,000 mg 200 mL/hr over 60 Minutes Intravenous  Once 02/24/17 1907 02/24/17 1915   02/24/17 1815  cefTRIAXone (ROCEPHIN) injection 1 g  Status:  Discontinued     1 g Intramuscular  Once 02/24/17 1801 02/24/17 1907        Subjective:   Jesus [name] was seen and examined today. More alert, no fevers now  Objective:   Vitals:   03/13/17 0421 03/13/17 0457 03/13/17 0700 03/13/17 1203  BP:  125/86 127/76 128/61  Pulse: 74 63 (!) 50 71  Resp: 19 (!) 24 16 (!) 31  Temp:  97.2 F (36.2 C) 98.2 F (36.8 C) 98.7 F (37.1 C)  TempSrc:  Axillary Axillary Oral  SpO2: 98% 100%  96%  Weight:      Height:        Intake/Output Summary (Last 24 hours) at 03/13/17 1323 Last data filed at 03/13/17 1207  Gross per 24 hour  Intake              950 ml  Output              975 ml  Net              -25 ml     Wt Readings from Last 3 Encounters:  03/13/17 121.6 kg (268 lb)  02/17/17 136.1 kg (300 lb)  02/16/17 (!) 136.2 kg (300 lb 3.2 oz)  Exam  General: more alert and awake, no distress  Neck: Supple, no JVD,   Cardiovascular: S1 S2 clear, RRR  Respiratory: Clear to auscultation bilaterally anteriorly, no wheezing  Gastrointestinal: Morbidly obese, Soft, nontender,  nondistended, + bowel sounds  Ext: no cyanosis clubbing   Neuro: more alert today  Skin: Pressure boots on bilaterally on the heels  Psych: unable to assess   Data Reviewed:  I have personally reviewed following labs and imaging studies  Micro Results Recent Results (from the past 240 hour(s))  Culture, Urine     Status: None   Collection Time: 03/04/17  9:27 PM  Result Value Ref Range Status   Specimen Description URINE, RANDOM  Final   Special Requests NONE  Final   Culture NO GROWTH  Final   Report Status 03/06/2017 FINAL  Final  Culture, blood (routine x 2)     Status: None (Preliminary result)   Collection Time: 03/12/17 10:29 AM  Result Value Ref Range Status   Specimen Description BLOOD RIGHT HAND  Final   Special Requests IN PEDIATRIC BOTTLE Blood Culture adequate volume  Final   Culture NO GROWTH < 12 HOURS  Final   Report Status PENDING  Incomplete  Culture, blood (routine x 2)     Status: None (Preliminary result)   Collection Time: 03/12/17 10:29 AM  Result Value Ref Range Status   Specimen Description BLOOD RIGHT HAND  Final   Special Requests IN PEDIATRIC BOTTLE Blood Culture adequate volume  Final   Culture NO GROWTH < 12 HOURS  Final   Report Status PENDING  Incomplete    Radiology Reports Dg Chest 1 View  Result Date: 03/12/2017 CLINICAL DATA:  Fever EXAM: CHEST 1 VIEW COMPARISON:  CT and chest radiography 1 week ago. FINDINGS: Chronic cardiomegaly. Chronic aortic atherosclerosis. Right arm PICC tip in the SVC at the azygos level. Left chest is clear except for minimal atelectasis in the lower lobe. Right chest shows hazy density with volume loss in the right lower lobe, probably related to a right effusion with right lower lobe volume loss. Appearance is improved compared to the previous studies. No worsening or new findings. Old rib fractures on the right. IMPRESSION: Radiographic improvement. Some persistent volume loss in the lower lobes, right more than  left. Electronically Signed   By: Paulina Fusi M.D.   On: 03/12/2017 11:33   Ct Chest Wo Contrast  Result Date: 03/07/2017 CLINICAL DATA:  Acute on chronic respiratory failure. Hypoxia and hypercapnia. History of CHF, atrial fibrillation, pulmonary vascular congestion, sepsis. EXAM: CT CHEST WITHOUT CONTRAST TECHNIQUE: Multidetector CT imaging of the chest was performed following the standard protocol without IV contrast. COMPARISON:  Chest radiograph March 07, 2017 at 1523 hours FINDINGS: Cardiovascular: The heart is moderately enlarged. No pericardial effusions. Severe coronary artery calcifications. Ascending aorta is 4.3 by 4.7 cm in transaxial dimension with moderate calcific atherosclerosis. RIGHT PICC distal tip in the upper superior vena cava. Mediastinum/Nodes: No lymphadenopathy by CT size criteria though sensitivity decreased without intravenous contrast. Lungs/Pleura: Small to moderate RIGHT and small LEFT layering pleural effusions with underlying consolidation. Scattered calcified granulomata. Patchy to RIGHT middle lobe, RIGHT lower lobe consolidation with air bronchograms. Heterogeneous lung attenuation. No pulmonary masses, limited assessment for pulmonary nodule due to respiratory motion and habitus. Upper Abdomen: Nonacute. Streak artifact through upper abdomen, patient was scanned with arms at side. 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 Old RIGHT rib fractures. Moderate degenerative change of thoracic spine. IMPRESSION: Small  to moderate RIGHT and small LEFT pleural effusions. RIGHT middle and RIGHT lower lobe suspected pneumonia. Cardiomegaly.  Findings of small airway disease. **An incidental finding of potential clinical significance has been found. 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 Ascending thoracic aortic aneurysm. Recommend semi-annual imaging followup by CTA or MRA and referral to cardiothoracic surgery if not already obtained. This  recommendation follows 2010 ACCF/AHA/AATS/ACR/ASA/SCA/SCAI/SIR/STS/SVM Guidelines for the Diagnosis and Management of Patients With Thoracic Aortic Disease. Circulation. 2010; 121: [id number]** Electronically Signed   By: Awilda Metro M.D.   On: 03/07/2017 22:01   Dg Chest Port 1 View  Result Date: 03/07/2017 CLINICAL DATA:  Respiratory failure, history CHF, hypertension, chronic atrial fibrillation, morbid obesity EXAM: PORTABLE CHEST 1 VIEW COMPARISON:  Portable exam 1523 hours compared to 02/26/2017 FINDINGS: RIGHT arm PICC line tip projects over SVC. Enlargement of cardiac silhouette with pulmonary vascular congestion. Increased pulmonary infiltrates particularly RIGHT with minimal RIGHT pleural effusion, likely representing pulmonary edema and CHF. No pneumothorax. Visualized osseous structures unremarkable. IMPRESSION: Enlargement of cardiac silhouette with pulmonary vascular congestion and probable mild pulmonary edema/CHF. Electronically Signed   By: Ulyses Southward M.D.   On: 03/07/2017 15:40   Dg Chest Port 1 View  Result Date: 02/26/2017 CLINICAL DATA:  PICC line. EXAM: PORTABLE CHEST 1 VIEW COMPARISON:  02/24/2017 . FINDINGS: PICC line noted with tip over the cavoatrial junction. Cardiomegaly with pulmonary interstitial prominence. Small right pleural effusion. No pneumothorax . IMPRESSION: 1. PICC line noted with tip projected over the cavoatrial junction. 2. Congestive heart failure with pulmonary interstitial edema and small right pleural effusion . Electronically Signed   By: Maisie Fus  Register   On: 02/26/2017 11:37   Dg Chest Port 1 View  Result Date: 02/24/2017 CLINICAL DATA:  Altered mental status EXAM: PORTABLE CHEST 1 VIEW COMPARISON:  January 20, 2017 FINDINGS: There is persistent cardiomegaly. There is pulmonary venous hypertension. There is no edema or consolidation. No adenopathy. No bone lesions. IMPRESSION: Pulmonary vascular congestion without edema or consolidation.  Electronically Signed   By: Bretta Bang III M.D.   On: 02/24/2017 15:08    Lab Data:  CBC:  Recent Labs Lab 03/07/17 0352 03/08/17 0533 03/09/17 0530  WBC 5.5 4.9 5.6  HGB 7.5* 7.4* 8.4*  HCT 25.5* 24.2* 27.4*  MCV 96.2 95.3 93.8  PLT 274 244 297   Basic Metabolic Panel:  Recent Labs Lab 03/07/17 0352 03/08/17 0533 03/09/17 0530 03/10/17 0538 03/11/17 0312  NA 142 137 137  --   --   K 3.7 3.5 3.5  --   --   CL 101 95* 92*  --   --   CO2 34* 35* 35*  --   --   GLUCOSE 101* 85 77  --   --   BUN --   --   CREATININE 0.79 0.77 0.81  --   --   CALCIUM 9.0 8.6* 8.5*  --   --   MG  --  2.0 1.8 1.7 1.7  PHOS  --  3.5 3.5 3.3 2.6   GFR: Estimated Creatinine Clearance: 110.9 mL/min (by C-G formula based on SCr of 0.81 mg/dL). Liver Function Tests: No results for input(s): AST, ALT, ALKPHOS, BILITOT, PROT, ALBUMIN in the last 168 hours. No results for input(s): LIPASE, AMYLASE in the last 168 hours.  Recent Labs Lab 03/07/17 1400  AMMONIA 20   Coagulation Profile: No results for input(s): INR, PROTIME in the last 168 hours. Cardiac Enzymes: No  results for input(s): CKTOTAL, CKMB, CKMBINDEX, TROPONINI in the last 168 hours. BNP (last 3 results) No results for input(s): PROBNP in the last 8760 hours. HbA1C: No results for input(s): HGBA1C in the last 72 hours. CBG: No results for input(s): GLUCAP in the last 168 hours. Lipid Profile: No results for input(s): CHOL, HDL, LDLCALC, TRIG, CHOLHDL, LDLDIRECT in the last 72 hours. Thyroid Function Tests: No results for input(s): TSH, T4TOTAL, FREET4, T3FREE, THYROIDAB in the last 72 hours. Anemia Panel: No results for input(s): VITAMINB12, FOLATE, FERRITIN, TIBC, IRON, RETICCTPCT in the last 72 hours. Urine analysis:    Component Value Date/Time   COLORURINE AMBER (A) 03/12/2017 1238   APPEARANCEUR CLOUDY (A) 03/12/2017 1238   LABSPEC 1.018 03/12/2017 1238   PHURINE 5.0 03/12/2017 1238   GLUCOSEU  NEGATIVE 03/12/2017 1238   HGBUR MODERATE (A) 03/12/2017 1238   BILIRUBINUR NEGATIVE 03/12/2017 1238   KETONESUR NEGATIVE 03/12/2017 1238   PROTEINUR 100 (A) 03/12/2017 1238   UROBILINOGEN 4.0 (H) 06/02/2012 1200   NITRITE NEGATIVE 03/12/2017 1238   LEUKOCYTESUR LARGE (A) 03/12/2017 1238     Senya Hinzman M.D. [facility] 03/13/2017, 1:23 PM  Pager: [phone] Between 7am to 7pm - text page via AMION - [phone]  After 7pm go to [email or web address] - password [email or web address], Call night coverage person covering after 7pm

## 2017-03-14 LAB — BASIC METABOLIC PANEL
ANION GAP: 9 (ref 5–15)
BUN: 14 mg/dL (ref 6–20)
CO2: 38 mmol/L — ABNORMAL HIGH (ref 22–32)
Calcium: 8.4 mg/dL — ABNORMAL LOW (ref 8.9–10.3)
Chloride: 85 mmol/L — ABNORMAL LOW (ref 101–111)
Creatinine, Ser: 0.7 mg/dL (ref 0.61–1.24)
GLUCOSE: 94 mg/dL (ref 65–99)
POTASSIUM: 2.4 mmol/L — AB (ref 3.5–5.1)
Sodium: 132 mmol/L — ABNORMAL LOW (ref 135–145)

## 2017-03-14 LAB — CBC
HEMATOCRIT: 27.3 % — AB (ref 39.0–52.0)
Hemoglobin: 8.4 g/dL — ABNORMAL LOW (ref 13.0–17.0)
MCH: 28 pg (ref 26.0–34.0)
MCHC: 30.8 g/dL (ref 30.0–36.0)
MCV: 91 fL (ref 78.0–100.0)
PLATELETS: 300 10*3/uL (ref 150–400)
RBC: 3 MIL/uL — AB (ref 4.22–5.81)
RDW: 16 % — ABNORMAL HIGH (ref 11.5–15.5)
WBC: 5.7 10*3/uL (ref 4.0–10.5)

## 2017-03-14 LAB — URINE CULTURE: Culture: NO GROWTH

## 2017-03-14 LAB — PROTIME-INR
INR: 1.71
Prothrombin Time: 20.3 seconds — ABNORMAL HIGH (ref 11.4–15.2)

## 2017-03-14 MED ORDER — POTASSIUM CHLORIDE CRYS ER 20 MEQ PO TBCR
40.0000 meq | EXTENDED_RELEASE_TABLET | ORAL | Status: AC
  Administered 2017-03-14 (×4): 40 meq via ORAL
  Filled 2017-03-14 (×4): qty 2

## 2017-03-14 NOTE — Progress Notes (Signed)
Patient refusing to wear to Bipap, explained to patient need to wear to further his medical progress, Patient continues to refuse wear Bipap, will continue to monitor closely.

## 2017-03-14 NOTE — Progress Notes (Signed)
Called by Dr. Jomarie Longs for placement of IV access.  The patient had a PICC line and pulled it out.  Reportedly IV team did not "feel comfortable" placing another PICC.  Per staff, the patient is reportedly pending possible discharge on Monday (4/9).  He is able to take PO medications.  Only IV medications are rocephin and lasix.    Peripheral IV placed to R mid forearm using ultrasound.  #20g x1 stick with excellent blood flow and flushes easily.  Dressing applied.  RN notified.    Would attempt to avoid central access as patient is able to swallow and has PIV.     Canary Brim, NP-C [facility] Pgr: [phone] or if no answer [phone] 03/14/2017, 12:26 PM

## 2017-03-14 NOTE — Progress Notes (Signed)
CRITICAL VALUE ALERT  Critical value received:  K 2.4  Date of notification:  03/14/17  Time of notification:  0834  Critical value read back:yes  Nurse who received alert:  Silvio Pate  MD notified (1st page):  Mitchel Honour  Time of first page:  [phone]  MD notified (2nd page):  Time of second page:  Responding MD:  Dr. Jomarie Longs  Time MD responded:  [phone]

## 2017-03-14 NOTE — Progress Notes (Signed)
[facility]                                                                           Patient Demographics Jesus [name], is a 71 y.o. male, DOB - February 20, 1946, ZOX:[id number] Admit date - 02/24/2017   Admitting Physician Michael Litter, MD Outpatient Primary MD for the patient is [name],[name], [name] Outpatient specialists:  LOS - 18  days  Chief Complaint  Patient presents with  . Altered Mental Status      Brief summary   70 y.o.male with a history of chronic atrial fibrillation (anticoagulated with Eliquis; CHADS-Vasc score of 4), CKD 3, chronic diastolic heart failure, HTN, chronic anemia, chronic pain, chronic venous insufficiency, chronic edema, cirrhosis of the liver by CT, incidental lesions on his liver and pancreas seen on prior CT imaging. He was recently hospitalized from 1/22-2/16 for aspiration pneumonia and infected sacral decubitus ulcer requiring surgical debridement. The wound ultimately required a wound vac and he was referred to [facility]. According to his daughter, he did well there, but has declined quickly since being back at [facility] for less than one week. Patient found to have altered mental status and probable sepsis.  Patient was found to have acute hypoxic and hypercarbic respiratory failure, initially admitted to stepdown unit, subsequently yesterday status improved and he was transferred to floor. However on 3/27, patient was again noticed to be lethargic and PCO2 >100 on ABG. He was transferred to stepdown unit and placed on BiPAP. Patient was noticed to have mental status changes and respiratory failure every time he was weaned off of BiPAP. Pulmonology consult was obtained and recommended continuing BiPAP QHS. He was also placed on Lasix for diuresis. Plan for DC to skilled nursing when improved   Assessment & Plan   Acute hypoxic and hypercarbic respiratory failure / Acute pulmonary edema - CXR on admission showed CHF with pulmonary interstitial edema and  small right pleural effusion -due to third spacing from low albumin/cirrhosis and diastolic CHF - On admission, patient was placed on BiPAP, subsequently his respiratory status was improved and patient was transferred to the floor, then on 3/27, patient was noticed to be lethargic, not following commands, ABG showed pH of 7.166 pCO2 102 - Patient was transferred back to stepdown unit and placed on BiPAP (DNR status). - May have underlying OSA/obesity hypoventilation, will need outpatient sleep study to qualify for CPAP. - Palliative medicine following, plan for SNF with Palliative care - Patient was placed on BiPAP again on 3/30 due to hypoxic respiratory failure. He remained on BiPAP overnight however still lethargic, hence PCCM consulted, recommended aggressive BiPAP qhs and prn during the day, Lasix for diuresis - Patient has been improving with BiPAP qhs, he will need to be discharged on BiPAP QHS (settings 10/5. Rate 12, 3-4L O2 flow, Fio2 30%) at skilled nursing facility along with diuretics -s/p Palliative meeting, DNR and plan for DC to SNF when stable with Palliative follow up -continue IV lasix for now, has significant third spacing from hypoalbuminemia due to liver cirrhosis/malnutrition, lost IV access yesterday, PCCM requested to place central line  Acute metabolic encephalopathy likely due to #1, underlying dementia  -improved -  Ammonia level was normal, - EEG showed mild to moderate diffuse slowing, no seizures - MRI was canceled due to patient's agitation. Neurology signed off, recommended no further work-up. -mentation improved, now also has catheter related UTI  UTI- -febrile and lethargic 48hours ago -changed foley catheter 4/5, UA suggestive of infection -continue Ceftriaxone, urine Cx pending  Acute on Chronic diastolic heart failure - 2 D ECHO in 12/2014 showed EF 55-60% - Continue IV Lasix for diuresis,  q 12-pending IV access - negative 14L  Severe  Hypokalemia -due to diuretics, freq stools -replace  Sepsissecondary to catheter associated Providenicia stuartii UTI as well as cellulitis and superimposed infection involving chronic nonhealing sacral decubitus ulcer, both present on admission. - Urine cx grew Providencia, Blood cx so far showed no growth - Pt was on vanco and zosyn but this was narrowed down to Rocephin 3/22, until 3/31  - Sepsis physiology has resolved  Mood disorder - Continue Depakote   Chronic atrial fibrillation / Coagulopathy  - CHADS vasc score 4 - Continue anticoagulation with apixaban - Rate controlled without beta blockers  Anemia of chronic disease - Due to bone marrow suppression from history of liver cirrhosis - Has had 2 units PRBC transfusion on 3/22 - transfuse for hemoglobin less than 7.5, stable now  Diarrhea - C. difficile negative - No diarrhea in past   Liver cirrhosis - Continue lactulose  - Continue protonix   Multiple wounds, sacral wounds, stage 4 sacral decubitus ulcer - Stage IV sacrum, partial thickness buttocks, bilateral pretibial and calves, left lat foot, left inner thigh. Unstageable to bilateral heels, and lateral plantar surface of R foot, DTI to L medial heel - Sacrum 15cm x 18cm x 6cm deep stage 4 - Buttocks with multiple various size partial thickness wounds - Unstageable on Lateral plantar surface of R foot 1.5cm x 2cm x 0cm  - Unstageable R heel 2.5cm x 6cm x 0cm - R lateral calf 6cm x 3cm x 0.1cm partial thickness 5 - Proximal to right ankle 2.5cm x 0.5cm x 0cm dark purple.  - Two intact blisters to inner left thigh 6cm x 1.5cm and 3cm x 1cm - Left lateral calf 6cm x 3cm x 0.1cm partial thickness and one 3cm  - Left medial heel 2.5cm x 2.5cm  - Left lateral foot 4cm x 4cm and a 2cm x 4cm  - Left posterior calf with 4cm x 3cm x 0cm  - Dressing procedure/placement/frequency: NS moistened W to D dressing BID for sacral ulcer. Xeroform and foam to wounds on  heels, foam to intact blisters, xeroform and gauze to partial thickness on BLE calves. Pt is already on a specialty bed. Pt already has pressure alleviating boots on.   Code Status: DNR  DVT Prophylaxis:  apixaban Family Communication: no family at bedside, called and d/w sister who was at bedside 4/5 Disposition Plan: SNF early next week. Will definitely need BiPAP qhs, when more stable  Time Spent in minutes   25  minutes  Procedures:  BiPAP  Consultants:   Palliative care Neurology  Pulmonary critical care  Antimicrobials:   Rocephin 3/22-> 3/31  Vanco and zosyn 3/20 --> 3/22    Medications  Scheduled Meds: . cefTRIAXone (ROCEPHIN)  IV  1 g Intravenous Q24H  . chlorhexidine  15 mL Mouth Rinse BID  . divalproex  250 mg Oral Q12H  . feeding supplement (ENSURE ENLIVE)  237 mL Oral BID BM  . feeding supplement (PRO-STAT SUGAR FREE 64)  30 mL  Oral BID  . furosemide  40 mg Intravenous Q12H  . lactulose  20 g Oral BID  . mouth rinse  15 mL Mouth Rinse BID  . memantine  5 mg Oral BID  . pantoprazole  40 mg Oral QHS  . potassium chloride  40 mEq Oral Q2H  . sodium chloride flush  10-40 mL Intracatheter Q12H   Continuous Infusions:  PRN Meds:.acetaminophen **OR** acetaminophen, haloperidol lactate, hydrocortisone cream, ondansetron **OR** ondansetron (ZOFRAN) IV, sodium chloride flush   Antibiotics   Anti-infectives    Start     Dose/Rate Route Frequency Ordered Stop   03/12/17 1500  cefTRIAXone (ROCEPHIN) 1 g in dextrose 5 % 50 mL IVPB     1 g 100 mL/hr over 30 Minutes Intravenous Every 24 hours 03/12/17 1411     02/27/17 1400  cefTRIAXone (ROCEPHIN) 1 g in dextrose 5 % 50 mL IVPB  Status:  Discontinued     1 g 100 mL/hr over 30 Minutes Intravenous Every 24 hours 02/27/17 1245 03/08/17 0926   02/26/17 2000  vancomycin (VANCOCIN) IVPB 1000 mg/200 mL premix  Status:  Discontinued     1,000 mg 200 mL/hr over 60 Minutes Intravenous Every 24 hours 02/26/17 0809  02/27/17 1245   02/25/17 0800  vancomycin (VANCOCIN) IVPB 1000 mg/200 mL premix  Status:  Discontinued     1,000 mg 200 mL/hr over 60 Minutes Intravenous Every 12 hours 02/24/17 2102 02/26/17 0809   02/25/17 0600  piperacillin-tazobactam (ZOSYN) IVPB 3.375 g  Status:  Discontinued     3.375 g 12.5 mL/hr over 240 Minutes Intravenous Every 8 hours 02/24/17 2102 02/27/17 1245   02/24/17 1930  vancomycin (VANCOCIN) 2,000 mg in sodium chloride 0.9 % 500 mL IVPB     2,000 mg 250 mL/hr over 120 Minutes Intravenous  Once 02/24/17 1915 02/24/17 2201   02/24/17 1915  piperacillin-tazobactam (ZOSYN) IVPB 3.375 g     3.375 g 100 mL/hr over 30 Minutes Intravenous  Once 02/24/17 1907 02/24/17 2359   02/24/17 1915  vancomycin (VANCOCIN) IVPB 1000 mg/200 mL premix  Status:  Discontinued     1,000 mg 200 mL/hr over 60 Minutes Intravenous  Once 02/24/17 1907 02/24/17 1915   02/24/17 1815  cefTRIAXone (ROCEPHIN) injection 1 g  Status:  Discontinued     1 g Intramuscular  Once 02/24/17 1801 02/24/17 1907        Subjective:   Dinero Mcpeek was seen and examined today. More alert, no fevers now, awaiting access  Objective:   Vitals:   03/13/17 2300 03/13/17 2333 03/14/17 0334 03/14/17 0821  BP:  (!) 134/53 (!) 156/53 (!) 147/59  Pulse: 68 74  71  Resp: (!) 22 20 (!) 25 (!) 25  Temp:  99 F (37.2 C) 99 F (37.2 C) 99.4 F (37.4 C)  TempSrc:  Axillary Oral Oral  SpO2: 99% 98% 97% 95%  Weight:   117 kg (258 lb)   Height:        Intake/Output Summary (Last 24 hours) at 03/14/17 1138 Last data filed at 03/14/17 0924  Gross per 24 hour  Intake              600 ml  Output              875 ml  Net             -275 ml     Wt Readings from Last 3 Encounters:  03/14/17 117 kg (258 lb)  02/17/17  136.1 kg (300 lb)  02/16/17 (!) 136.2 kg (300 lb 3.2 oz)     Exam  General: more alert and awake, no distress  Neck: Supple, no JVD,   Cardiovascular: S1 S2 clear, RRR  Respiratory: Clear  to auscultation bilaterally anteriorly, no wheezing  Gastrointestinal: Morbidly obese, Soft, nontender, nondistended, + bowel sounds  Ext: no cyanosis clubbing   Neuro: more alert today  Skin: Pressure boots on bilaterally on the heels, fluid filled blisters on arms, lower legs, small ulcers etc  Psych: appropriate   Data Reviewed:  I have personally reviewed following labs and imaging studies  Micro Results Recent Results (from the past 240 hour(s))  Culture, Urine     Status: None   Collection Time: 03/04/17  9:27 PM  Result Value Ref Range Status   Specimen Description URINE, RANDOM  Final   Special Requests NONE  Final   Culture NO GROWTH  Final   Report Status 03/06/2017 FINAL  Final  Culture, blood (routine x 2)     Status: None (Preliminary result)   Collection Time: 03/12/17 10:29 AM  Result Value Ref Range Status   Specimen Description BLOOD RIGHT HAND  Final   Special Requests IN PEDIATRIC BOTTLE Blood Culture adequate volume  Final   Culture NO GROWTH 1 DAY  Final   Report Status PENDING  Incomplete  Culture, blood (routine x 2)     Status: None (Preliminary result)   Collection Time: 03/12/17 10:29 AM  Result Value Ref Range Status   Specimen Description BLOOD RIGHT HAND  Final   Special Requests IN PEDIATRIC BOTTLE Blood Culture adequate volume  Final   Culture NO GROWTH 1 DAY  Final   Report Status PENDING  Incomplete    Radiology Reports Dg Chest 1 View  Result Date: 03/12/2017 CLINICAL DATA:  Fever EXAM: CHEST 1 VIEW COMPARISON:  CT and chest radiography 1 week ago. FINDINGS: Chronic cardiomegaly. Chronic aortic atherosclerosis. Right arm PICC tip in the SVC at the azygos level. Left chest is clear except for minimal atelectasis in the lower lobe. Right chest shows hazy density with volume loss in the right lower lobe, probably related to a right effusion with right lower lobe volume loss. Appearance is improved compared to the previous studies. No worsening  or new findings. Old rib fractures on the right. IMPRESSION: Radiographic improvement. Some persistent volume loss in the lower lobes, right more than left. Electronically Signed   By: Paulina Fusi M.D.   On: 03/12/2017 11:33   Ct Chest Wo Contrast  Result Date: 03/07/2017 CLINICAL DATA:  Acute on chronic respiratory failure. Hypoxia and hypercapnia. History of CHF, atrial fibrillation, pulmonary vascular congestion, sepsis. EXAM: CT CHEST WITHOUT CONTRAST TECHNIQUE: Multidetector CT imaging of the chest was performed following the standard protocol without IV contrast. COMPARISON:  Chest radiograph March 07, 2017 at 1523 hours FINDINGS: Cardiovascular: The heart is moderately enlarged. No pericardial effusions. Severe coronary artery calcifications. Ascending aorta is 4.3 by 4.7 cm in transaxial dimension with moderate calcific atherosclerosis. RIGHT PICC distal tip in the upper superior vena cava. Mediastinum/Nodes: No lymphadenopathy by CT size criteria though sensitivity decreased without intravenous contrast. Lungs/Pleura: Small to moderate RIGHT and small LEFT layering pleural effusions with underlying consolidation. Scattered calcified granulomata. Patchy to RIGHT middle lobe, RIGHT lower lobe consolidation with air bronchograms. Heterogeneous lung attenuation. No pulmonary masses, limited assessment for pulmonary nodule due to respiratory motion and habitus. Upper Abdomen: Nonacute. Streak artifact through upper abdomen, patient was scanned  with arms at side. 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 Old RIGHT rib fractures. Moderate degenerative change of thoracic spine. IMPRESSION: Small to moderate RIGHT and small LEFT pleural effusions. RIGHT middle and RIGHT lower lobe suspected pneumonia. Cardiomegaly.  Findings of small airway disease. **An incidental finding of potential clinical significance has been found. 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 Ascending  thoracic aortic aneurysm. Recommend semi-annual imaging followup by CTA or MRA and referral to cardiothoracic surgery if not already obtained. This recommendation follows 2010 ACCF/AHA/AATS/ACR/ASA/SCA/SCAI/SIR/STS/SVM Guidelines for the Diagnosis and Management of Patients With Thoracic Aortic Disease. Circulation. 2010; 121: Q469-G295** Electronically Signed   By: Awilda Metro M.D.   On: 03/07/2017 22:01   Dg Chest Port 1 View  Result Date: 03/07/2017 CLINICAL DATA:  Respiratory failure, history CHF, hypertension, chronic atrial fibrillation, morbid obesity EXAM: PORTABLE CHEST 1 VIEW COMPARISON:  Portable exam 1523 hours compared to 02/26/2017 FINDINGS: RIGHT arm PICC line tip projects over SVC. Enlargement of cardiac silhouette with pulmonary vascular congestion. Increased pulmonary infiltrates particularly RIGHT with minimal RIGHT pleural effusion, likely representing pulmonary edema and CHF. No pneumothorax. Visualized osseous structures unremarkable. IMPRESSION: Enlargement of cardiac silhouette with pulmonary vascular congestion and probable mild pulmonary edema/CHF. Electronically Signed   By: Ulyses Southward M.D.   On: 03/07/2017 15:40   Dg Chest Port 1 View  Result Date: 02/26/2017 CLINICAL DATA:  PICC line. EXAM: PORTABLE CHEST 1 VIEW COMPARISON:  02/24/2017 . FINDINGS: PICC line noted with tip over the cavoatrial junction. Cardiomegaly with pulmonary interstitial prominence. Small right pleural effusion. No pneumothorax . IMPRESSION: 1. PICC line noted with tip projected over the cavoatrial junction. 2. Congestive heart failure with pulmonary interstitial edema and small right pleural effusion . Electronically Signed   By: Maisie Fus  Register   On: 02/26/2017 11:37   Dg Chest Port 1 View  Result Date: 02/24/2017 CLINICAL DATA:  Altered mental status EXAM: PORTABLE CHEST 1 VIEW COMPARISON:  January 20, 2017 FINDINGS: There is persistent cardiomegaly. There is pulmonary venous hypertension.  There is no edema or consolidation. No adenopathy. No bone lesions. IMPRESSION: Pulmonary vascular congestion without edema or consolidation. Electronically Signed   By: Bretta Bang III M.D.   On: 02/24/2017 15:08    Lab Data:  CBC:  Recent Labs Lab 03/08/17 0533 03/09/17 0530 03/14/17 0755  WBC 4.9 5.6 5.7  HGB 7.4* 8.4* 8.4*  HCT 24.2* 27.4* 27.3*  MCV 95.3 93.8 91.0  PLT 244 297 300   Basic Metabolic Panel:  Recent Labs Lab 03/08/17 0533 03/09/17 0530 03/10/17 0538 03/11/17 0312 03/14/17 0755  NA 137 137  --   --  132*  K 3.5 3.5  --   --  2.4*  CL 95* 92*  --   --  85*  CO2 35* 35*  --   --  38*  GLUCOSE 85 77  --   --  94  BUN 12 11  --   --  14  CREATININE 0.77 0.81  --   --  0.70  CALCIUM 8.6* 8.5*  --   --  8.4*  MG 2.0 1.8 1.7 1.7  --   PHOS 3.5 3.5 3.3 2.6  --    GFR: Estimated Creatinine Clearance: 110.1 mL/min (by C-G formula based on SCr of 0.7 mg/dL). Liver Function Tests: No results for input(s): AST, ALT, ALKPHOS, BILITOT, PROT, ALBUMIN in the last 168 hours. No results for input(s): LIPASE, AMYLASE in the last 168 hours.  Recent Labs Lab  03/07/17 1400  AMMONIA 20   Coagulation Profile:  Recent Labs Lab 03/14/17 0939  INR 1.71   Cardiac Enzymes: No results for input(s): CKTOTAL, CKMB, CKMBINDEX, TROPONINI in the last 168 hours. BNP (last 3 results) No results for input(s): PROBNP in the last 8760 hours. HbA1C: No results for input(s): HGBA1C in the last 72 hours. CBG:  Recent Labs Lab 03/13/17 1707  GLUCAP 95   Lipid Profile: No results for input(s): CHOL, HDL, LDLCALC, TRIG, CHOLHDL, LDLDIRECT in the last 72 hours. Thyroid Function Tests: No results for input(s): TSH, T4TOTAL, FREET4, T3FREE, THYROIDAB in the last 72 hours. Anemia Panel: No results for input(s): VITAMINB12, FOLATE, FERRITIN, TIBC, IRON, RETICCTPCT in the last 72 hours. Urine analysis:    Component Value Date/Time   COLORURINE AMBER (A) 03/12/2017  1238   APPEARANCEUR CLOUDY (A) 03/12/2017 1238   LABSPEC 1.018 03/12/2017 1238   PHURINE 5.0 03/12/2017 1238   GLUCOSEU NEGATIVE 03/12/2017 1238   HGBUR MODERATE (A) 03/12/2017 1238   BILIRUBINUR NEGATIVE 03/12/2017 1238   KETONESUR NEGATIVE 03/12/2017 1238   PROTEINUR 100 (A) 03/12/2017 1238   UROBILINOGEN 4.0 (H) 06/02/2012 1200   NITRITE NEGATIVE 03/12/2017 1238   LEUKOCYTESUR LARGE (A) 03/12/2017 1238     Arjay Jaskiewicz M.D. [facility] 03/14/2017, 11:38 AM  Pager: [phone] Between 7am to 7pm - text page via AMION - [phone]  After 7pm go to [email or web address] - password [email or web address], Call night coverage person covering after 7pm

## 2017-03-15 LAB — CBC
HEMATOCRIT: 29 % — AB (ref 39.0–52.0)
HEMOGLOBIN: 9.2 g/dL — AB (ref 13.0–17.0)
MCH: 28.9 pg (ref 26.0–34.0)
MCHC: 31.7 g/dL (ref 30.0–36.0)
MCV: 91.2 fL (ref 78.0–100.0)
Platelets: 373 10*3/uL (ref 150–400)
RBC: 3.18 MIL/uL — AB (ref 4.22–5.81)
RDW: 16.2 % — AB (ref 11.5–15.5)
WBC: 6.5 10*3/uL (ref 4.0–10.5)

## 2017-03-15 LAB — BASIC METABOLIC PANEL
ANION GAP: 13 (ref 5–15)
BUN: 14 mg/dL (ref 6–20)
CO2: 33 mmol/L — ABNORMAL HIGH (ref 22–32)
Calcium: 8.5 mg/dL — ABNORMAL LOW (ref 8.9–10.3)
Chloride: 88 mmol/L — ABNORMAL LOW (ref 101–111)
Creatinine, Ser: 0.78 mg/dL (ref 0.61–1.24)
Glucose, Bld: 82 mg/dL (ref 65–99)
POTASSIUM: 4.1 mmol/L (ref 3.5–5.1)
Sodium: 134 mmol/L — ABNORMAL LOW (ref 135–145)

## 2017-03-15 MED ORDER — FUROSEMIDE 40 MG PO TABS
40.0000 mg | ORAL_TABLET | Freq: Two times a day (BID) | ORAL | Status: DC
Start: 2017-03-15 — End: 2017-03-17
  Administered 2017-03-15 – 2017-03-17 (×4): 40 mg via ORAL
  Filled 2017-03-15 (×4): qty 1

## 2017-03-15 MED ORDER — FUROSEMIDE 80 MG PO TABS: 80.0000 mg | ORAL_TABLET | Freq: Two times a day (BID) | ORAL | Status: DC

## 2017-03-15 MED ORDER — POTASSIUM CHLORIDE CRYS ER 20 MEQ PO TBCR
40.0000 meq | EXTENDED_RELEASE_TABLET | Freq: Two times a day (BID) | ORAL | Status: AC
  Administered 2017-03-15 – 2017-03-16 (×4): 40 meq via ORAL
  Filled 2017-03-15 (×4): qty 2

## 2017-03-15 NOTE — Plan of Care (Signed)
Problem: Education: Goal: Knowledge of [name] Education information/materials will improve Outcome: Progressing Explained need to change dressings and do hygiene, pt continues to refuse bath at this time

## 2017-03-15 NOTE — Plan of Care (Signed)
Problem: Education: Goal: Knowledge of [facility] Education information/materials will improve Outcome: Not Progressing Explained multiple times need for cpap pt is confused and is talking about his car door open

## 2017-03-15 NOTE — Progress Notes (Signed)
[facility]                                                                           Patient Demographics Jesus [name], is a 71 y.o. male, DOB - [birth year], ZOX:[id number] Admit date - 02/24/2017   Admitting Physician Michael Litter, MD Outpatient Primary MD for the patient is JesusSALLY, [name] Outpatient specialists:  LOS - 19  days  Chief Complaint  Patient presents with  . Altered Mental Status      Brief summary   70 y.o.male with a history of chronic atrial fibrillation (anticoagulated with Eliquis; CHADS-Vasc score of 4), CKD 3, chronic diastolic heart failure, HTN, chronic anemia, chronic pain, chronic venous insufficiency, chronic edema, cirrhosis of the liver by CT, incidental lesions on his liver and pancreas seen on prior CT imaging. He was recently hospitalized from 1/22-2/16 for aspiration pneumonia and infected sacral decubitus ulcer requiring surgical debridement. The wound ultimately required a wound vac and he was referred to [facility]. According to his daughter, he did well there, but has declined quickly since being back at [facility] for less than one week. Patient found to have altered mental status and probable sepsis.  Patient was found to have acute hypoxic and hypercarbic respiratory failure, initially admitted to stepdown unit, subsequently yesterday status improved and he was transferred to floor. However on 3/27, patient was again noticed to be lethargic and PCO2 >100 on ABG. He was transferred to stepdown unit and placed on BiPAP. Patient was noticed to have mental status changes and respiratory failure every time he was weaned off of BiPAP. Pulmonology consult was obtained and recommended continuing BiPAP QHS. He was also placed on Lasix for diuresis. Plan for DC to skilled nursing when improved   Assessment & Plan   Acute hypoxic and hypercarbic respiratory failure / Acute pulmonary edema - CXR on admission showed CHF with pulmonary interstitial edema and  small right pleural effusion -due to third spacing from low albumin/cirrhosis and diastolic CHF - On admission, patient was placed on BiPAP, subsequently his respiratory status was improved and patient was transferred to the floor, then on 3/27, patient was noticed to be lethargic, not following commands, ABG showed pH of 7.166 pCO2 102 - Patient was transferred back to stepdown unit and placed on BiPAP (DNR status). - May have underlying OSA/obesity hypoventilation, will need outpatient sleep study to qualify for CPAP. - Palliative medicine following, plan for SNF with Palliative care - Patient was placed on BiPAP again on 3/30 due to hypoxic respiratory failure. He remained on BiPAP overnight however still lethargic, hence PCCM consulted, recommended aggressive BiPAP qhs and prn during the day, Lasix for diuresis - Patient has been improving with BiPAP qhs, he will need to be discharged on BiPAP QHS (settings 10/5. Rate 12, 3-4L O2 flow, Fio2 30%) at skilled nursing facility along with diuretics -s/p Palliative meeting, DNR and plan for DC to SNF when stable with Palliative follow up -change to PO lasix-has third spacing from hypoalbuminemia due to liver cirrhosis/malnutrition,   Acute metabolic encephalopathy likely due to #1, underlying dementia  -improved - Ammonia level was normal, - EEG showed mild to moderate diffuse  slowing, no seizures - MRI was canceled due to patient's agitation. Neurology signed off, recommended no further work-up. -mentation improved, now also has catheter related UTI  UTI/fever- -febrile and lethargic 72hours ago, 4/4-4/5 -changed foley catheter 4/5, UA suggestive of infection-started Ceftraixone 4/5 -PICC fell out, now with PiV -CXR unremarkable  Acute on Chronic diastolic heart failure - 2 D ECHO in 12/2014 showed EF 55-60% - diuresed with IV lasix, now change to PO as above - negative 15.3L  Severe Hypokalemia -due to diuretics, freq  stools -replaced  Sepsissecondary to catheter associated Providenicia stuartii UTI as well as cellulitis and superimposed infection involving chronic nonhealing sacral decubitus ulcer, both present on admission. - Urine cx grew Providencia, Blood cx so far showed no growth - Pt was on vanco and zosyn but this was narrowed down to Rocephin 3/22, until 3/31  - Sepsis physiology has resolved  Mood disorder - Continue Depakote   Chronic atrial fibrillation / Coagulopathy  - CHADS vasc score 4 - Continue anticoagulation with apixaban - Rate controlled without beta blockers  Anemia of chronic disease - Due to bone marrow suppression from history of liver cirrhosis - Has had 2 units PRBC transfusion on 3/22 - transfuse for hemoglobin less than 7.5, stable now  Diarrhea - C. difficile negative - No diarrhea in past   Liver cirrhosis - Continue lactulose  - Continue protonix   Multiple wounds, sacral wounds, stage 4 sacral decubitus ulcer - Stage IV sacrum, partial thickness buttocks, bilateral pretibial and calves, left lat foot, left inner thigh. Unstageable to bilateral heels, and lateral plantar surface of R foot, DTI to L medial heel - Sacrum 15cm x 18cm x 6cm deep stage 4 - Buttocks with multiple various size partial thickness wounds - Unstageable on Lateral plantar surface of R foot 1.5cm x 2cm x 0cm  - Unstageable R heel 2.5cm x 6cm x 0cm - R lateral calf 6cm x 3cm x 0.1cm partial thickness 5 - Proximal to right ankle 2.5cm x 0.5cm x 0cm dark purple.  - Two intact blisters to inner left thigh 6cm x 1.5cm and 3cm x 1cm - Left lateral calf 6cm x 3cm x 0.1cm partial thickness and one 3cm  - Left medial heel 2.5cm x 2.5cm  - Left lateral foot 4cm x 4cm and a 2cm x 4cm  - Left posterior calf with 4cm x 3cm x 0cm  - Dressing procedure/placement/frequency: NS moistened W to D dressing BID for sacral ulcer. Xeroform and foam to wounds on heels, foam to intact blisters,  xeroform and gauze to partial thickness on BLE calves. Pt is already on a specialty bed. Pt already has pressure alleviating boots on.   Code Status: DNR  DVT Prophylaxis:  apixaban Family Communication: no family at bedside, called and d/w sister who was at bedside 4/5 Disposition Plan: SNF early next week. Will definitely need BiPAP qhs, when more stable  Time Spent in minutes   25  minutes  Procedures:  BiPAP  Consultants:   Palliative care Neurology  Pulmonary critical care  Antimicrobials:   Rocephin 3/22-> 3/31  Vanco and zosyn 3/20 --> 3/22    Medications  Scheduled Meds: . cefTRIAXone (ROCEPHIN)  IV  1 g Intravenous Q24H  . chlorhexidine  15 mL Mouth Rinse BID  . divalproex  250 mg Oral Q12H  . feeding supplement (ENSURE ENLIVE)  237 mL Oral BID BM  . feeding supplement (PRO-STAT SUGAR FREE 64)  30 mL Oral BID  . furosemide  40 mg Intravenous Q12H  . lactulose  20 g Oral BID  . mouth rinse  15 mL Mouth Rinse BID  . memantine  5 mg Oral BID  . pantoprazole  40 mg Oral QHS  . sodium chloride flush  10-40 mL Intracatheter Q12H   Continuous Infusions:  PRN Meds:.acetaminophen **OR** acetaminophen, haloperidol lactate, hydrocortisone cream, ondansetron **OR** ondansetron (ZOFRAN) IV, sodium chloride flush   Antibiotics   Anti-infectives    Start     Dose/Rate Route Frequency Ordered Stop   03/12/17 1500  cefTRIAXone (ROCEPHIN) 1 g in dextrose 5 % 50 mL IVPB     1 g 100 mL/hr over 30 Minutes Intravenous Every 24 hours 03/12/17 1411     02/27/17 1400  cefTRIAXone (ROCEPHIN) 1 g in dextrose 5 % 50 mL IVPB  Status:  Discontinued     1 g 100 mL/hr over 30 Minutes Intravenous Every 24 hours 02/27/17 1245 03/08/17 0926   02/26/17 2000  vancomycin (VANCOCIN) IVPB 1000 mg/200 mL premix  Status:  Discontinued     1,000 mg 200 mL/hr over 60 Minutes Intravenous Every 24 hours 02/26/17 0809 02/27/17 1245   02/25/17 0800  vancomycin (VANCOCIN) IVPB 1000 mg/200 mL  premix  Status:  Discontinued     1,000 mg 200 mL/hr over 60 Minutes Intravenous Every 12 hours 02/24/17 2102 02/26/17 0809   02/25/17 0600  piperacillin-tazobactam (ZOSYN) IVPB 3.375 g  Status:  Discontinued     3.375 g 12.5 mL/hr over 240 Minutes Intravenous Every 8 hours 02/24/17 2102 02/27/17 1245   02/24/17 1930  vancomycin (VANCOCIN) 2,000 mg in sodium chloride 0.9 % 500 mL IVPB     2,000 mg 250 mL/hr over 120 Minutes Intravenous  Once 02/24/17 1915 02/24/17 2201   02/24/17 1915  piperacillin-tazobactam (ZOSYN) IVPB 3.375 g     3.375 g 100 mL/hr over 30 Minutes Intravenous  Once 02/24/17 1907 02/24/17 2359   02/24/17 1915  vancomycin (VANCOCIN) IVPB 1000 mg/200 mL premix  Status:  Discontinued     1,000 mg 200 mL/hr over 60 Minutes Intravenous  Once 02/24/17 1907 02/24/17 1915   02/24/17 1815  cefTRIAXone (ROCEPHIN) injection 1 g  Status:  Discontinued     1 g Intramuscular  Once 02/24/17 1801 02/24/17 1907        Subjective:   Xavyer Gidley was seen and examined today. More alert, low grade fever last pm awaiting access  Objective:   Vitals:   03/15/17 0500 03/15/17 0510 03/15/17 0700 03/15/17 1252  BP:  105/86 92/68   Pulse:  83 83   Resp:  (!) 21 (!) 28   Temp:  98.3 F (36.8 C) 99.4 F (37.4 C) 98.4 F (36.9 C)  TempSrc:  Oral Oral Oral  SpO2:  97% (!) 89%   Weight: 116.5 kg (256 lb 13.4 oz)     Height:        Intake/Output Summary (Last 24 hours) at 03/15/17 1311 Last data filed at 03/15/17 1251  Gross per 24 hour  Intake              410 ml  Output             2525 ml  Net            -2115 ml     Wt Readings from Last 3 Encounters:  03/15/17 116.5 kg (256 lb 13.4 oz)  02/17/17 136.1 kg (300 lb)  02/16/17 (!) 136.2 kg (300 lb 3.2 oz)  Exam  General: less  alert but awake, oriented to self, place, no distress, obese, chronically ill appearing  Neck: Supple, no JVD,   Cardiovascular: S1 S2 clear, RRR  Respiratory: Clear to auscultation  bilaterally anteriorly, no wheezing  Gastrointestinal: Morbidly obese, Soft, nontender, nondistended, + bowel sounds  Ext: no cyanosis clubbing   Neuro: more alert today  Skin: Pressure boots on bilaterally on the heels, fluid filled blisters on arms, lower legs, small ulcers etc  Psych: appropriate   Data Reviewed:  I have personally reviewed following labs and imaging studies  Micro Results Recent Results (from the past 240 hour(s))  Culture, blood (routine x 2)     Status: None (Preliminary result)   Collection Time: 03/12/17 10:29 AM  Result Value Ref Range Status   Specimen Description BLOOD RIGHT HAND  Final   Special Requests IN PEDIATRIC BOTTLE Blood Culture adequate volume  Final   Culture NO GROWTH 2 DAYS  Final   Report Status PENDING  Incomplete  Culture, blood (routine x 2)     Status: None (Preliminary result)   Collection Time: 03/12/17 10:29 AM  Result Value Ref Range Status   Specimen Description BLOOD RIGHT HAND  Final   Special Requests IN PEDIATRIC BOTTLE Blood Culture adequate volume  Final   Culture NO GROWTH 2 DAYS  Final   Report Status PENDING  Incomplete  Culture, Urine     Status: None   Collection Time: 03/12/17 12:38 PM  Result Value Ref Range Status   Specimen Description URINE, RANDOM  Final   Special Requests NONE  Final   Culture NO GROWTH  Final   Report Status 03/14/2017 FINAL  Final    Radiology Reports Dg Chest 1 View  Result Date: 03/12/2017 CLINICAL DATA:  Fever EXAM: CHEST 1 VIEW COMPARISON:  CT and chest radiography 1 week ago. FINDINGS: Chronic cardiomegaly. Chronic aortic atherosclerosis. Right arm PICC tip in the SVC at the azygos level. Left chest is clear except for minimal atelectasis in the lower lobe. Right chest shows hazy density with volume loss in the right lower lobe, probably related to a right effusion with right lower lobe volume loss. Appearance is improved compared to the previous studies. No worsening or new  findings. Old rib fractures on the right. IMPRESSION: Radiographic improvement. Some persistent volume loss in the lower lobes, right more than left. Electronically Signed   By: Paulina Fusi M.D.   On: 03/12/2017 11:33   Ct Chest Wo Contrast  Result Date: 03/07/2017 CLINICAL DATA:  Acute on chronic respiratory failure. Hypoxia and hypercapnia. History of CHF, atrial fibrillation, pulmonary vascular congestion, sepsis. EXAM: CT CHEST WITHOUT CONTRAST TECHNIQUE: Multidetector CT imaging of the chest was performed following the standard protocol without IV contrast. COMPARISON:  Chest radiograph March 07, 2017 at 1523 hours FINDINGS: Cardiovascular: The heart is moderately enlarged. No pericardial effusions. Severe coronary artery calcifications. Ascending aorta is 4.3 by 4.7 cm in transaxial dimension with moderate calcific atherosclerosis. RIGHT PICC distal tip in the upper superior vena cava. Mediastinum/Nodes: No lymphadenopathy by CT size criteria though sensitivity decreased without intravenous contrast. Lungs/Pleura: Small to moderate RIGHT and small LEFT layering pleural effusions with underlying consolidation. Scattered calcified granulomata. Patchy to RIGHT middle lobe, RIGHT lower lobe consolidation with air bronchograms. Heterogeneous lung attenuation. No pulmonary masses, limited assessment for pulmonary nodule due to respiratory motion and habitus. Upper Abdomen: Nonacute. Streak artifact through upper abdomen, patient was scanned with arms at side. Musculoskeletal: Moderate to severe bilateral  glenohumeral osteoarthrosis. Old RIGHT rib fractures. Moderate degenerative change of thoracic spine. IMPRESSION: Small to moderate RIGHT and small LEFT pleural effusions. RIGHT middle and RIGHT lower lobe suspected pneumonia. Cardiomegaly.  Findings of small airway disease. **An incidental finding of potential clinical significance has been found. 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 Ascending thoracic  aortic aneurysm. Recommend semi-annual imaging followup by CTA or MRA and referral to cardiothoracic surgery if not already obtained. This recommendation follows 2010 ACCF/AHA/AATS/ACR/ASA/SCA/SCAI/SIR/STS/SVM Guidelines for the Diagnosis and Management of Patients With Thoracic Aortic Disease. Circulation. 2010; 121: Z610-R604** Electronically Signed   By: Awilda Metro M.D.   On: 03/07/2017 22:01   Dg Chest Port 1 View  Result Date: 03/07/2017 CLINICAL DATA:  Respiratory failure, history CHF, hypertension, chronic atrial fibrillation, morbid obesity EXAM: PORTABLE CHEST 1 VIEW COMPARISON:  Portable exam 1523 hours compared to 02/26/2017 FINDINGS: RIGHT arm PICC line tip projects over SVC. Enlargement of cardiac silhouette with pulmonary vascular congestion. Increased pulmonary infiltrates particularly RIGHT with minimal RIGHT pleural effusion, likely representing pulmonary edema and CHF. No pneumothorax. Visualized osseous structures unremarkable. IMPRESSION: Enlargement of cardiac silhouette with pulmonary vascular congestion and probable mild pulmonary edema/CHF. Electronically Signed   By: Ulyses Southward M.D.   On: 03/07/2017 15:40   Dg Chest Port 1 View  Result Date: 02/26/2017 CLINICAL DATA:  PICC line. EXAM: PORTABLE CHEST 1 VIEW COMPARISON:  02/24/2017 . FINDINGS: PICC line noted with tip over the cavoatrial junction. Cardiomegaly with pulmonary interstitial prominence. Small right pleural effusion. No pneumothorax . IMPRESSION: 1. PICC line noted with tip projected over the cavoatrial junction. 2. Congestive heart failure with pulmonary interstitial edema and small right pleural effusion . Electronically Signed   By: Maisie Fus  Register   On: 02/26/2017 11:37   Dg Chest Port 1 View  Result Date: 02/24/2017 CLINICAL DATA:  Altered mental status EXAM: PORTABLE CHEST 1 VIEW COMPARISON:  January 20, 2017 FINDINGS: There is persistent cardiomegaly. There is pulmonary venous hypertension. There is no  edema or consolidation. No adenopathy. No bone lesions. IMPRESSION: Pulmonary vascular congestion without edema or consolidation. Electronically Signed   By: Bretta Bang III M.D.   On: 02/24/2017 15:08    Lab Data:  CBC:  Recent Labs Lab 03/09/17 0530 03/14/17 0755 03/15/17 0937  WBC 5.6 5.7 6.5  HGB 8.4* 8.4* 9.2*  HCT 27.4* 27.3* 29.0*  MCV 93.8 91.0 91.2  PLT 297 300 373   Basic Metabolic Panel:  Recent Labs Lab 03/09/17 0530 03/10/17 0538 03/11/17 0312 03/14/17 0755 03/15/17 0937  NA 137  --   --  132* 134*  K 3.5  --   --  2.4* 4.1  CL 92*  --   --  85* 88*  CO2 35*  --   --  38* 33*  GLUCOSE 77  --   --  94 82  BUN 11  --   --  14 14  CREATININE 0.81  --   --  0.70 0.78  CALCIUM 8.5*  --   --  8.4* 8.5*  MG 1.8 1.7 1.7  --   --   PHOS 3.5 3.3 2.6  --   --    GFR: Estimated Creatinine Clearance: 109.9 mL/min (by C-G formula based on SCr of 0.78 mg/dL). Liver Function Tests: No results for input(s): AST, ALT, ALKPHOS, BILITOT, PROT, ALBUMIN in the last 168 hours. No results for input(s): LIPASE, AMYLASE in the last 168 hours. No results for input(s): AMMONIA in the last 168  hours. Coagulation Profile:  Recent Labs Lab 03/14/17 0939  INR 1.71   Cardiac Enzymes: No results for input(s): CKTOTAL, CKMB, CKMBINDEX, TROPONINI in the last 168 hours. BNP (last 3 results) No results for input(s): PROBNP in the last 8760 hours. HbA1C: No results for input(s): HGBA1C in the last 72 hours. CBG:  Recent Labs Lab 03/13/17 1707  GLUCAP 95   Lipid Profile: No results for input(s): CHOL, HDL, LDLCALC, TRIG, CHOLHDL, LDLDIRECT in the last 72 hours. Thyroid Function Tests: No results for input(s): TSH, T4TOTAL, FREET4, T3FREE, THYROIDAB in the last 72 hours. Anemia Panel: No results for input(s): VITAMINB12, FOLATE, FERRITIN, TIBC, IRON, RETICCTPCT in the last 72 hours. Urine analysis:    Component Value Date/Time   COLORURINE AMBER (A) 03/12/2017 1238    APPEARANCEUR CLOUDY (A) 03/12/2017 1238   LABSPEC 1.018 03/12/2017 1238   PHURINE 5.0 03/12/2017 1238   GLUCOSEU NEGATIVE 03/12/2017 1238   HGBUR MODERATE (A) 03/12/2017 1238   BILIRUBINUR NEGATIVE 03/12/2017 1238   KETONESUR NEGATIVE 03/12/2017 1238   PROTEINUR 100 (A) 03/12/2017 1238   UROBILINOGEN 4.0 (H) 06/02/2012 1200   NITRITE NEGATIVE 03/12/2017 1238   LEUKOCYTESUR LARGE (A) 03/12/2017 1238     Dywane Peruski M.D. [facility] 03/15/2017, 1:11 PM  Pager: [phone] Between 7am to 7pm - text page via AMION - [phone]  After 7pm go to [email or web address] - password [email or web address], Call night coverage person covering after 7pm

## 2017-03-16 LAB — BASIC METABOLIC PANEL
Anion gap: 11 (ref 5–15)
BUN: 15 mg/dL (ref 6–20)
CHLORIDE: 87 mmol/L — AB (ref 101–111)
CO2: 37 mmol/L — AB (ref 22–32)
CREATININE: 0.81 mg/dL (ref 0.61–1.24)
Calcium: 8.7 mg/dL — ABNORMAL LOW (ref 8.9–10.3)
GFR calc Af Amer: >60 mL/min (ref >60)
GFR calc non Af Amer: >60 mL/min (ref >60)
Glucose, Bld: 100 mg/dL — ABNORMAL HIGH (ref 65–99)
POTASSIUM: 3.5 mmol/L (ref 3.5–5.1)
Sodium: 135 mmol/L (ref 135–145)

## 2017-03-16 LAB — CBC
HEMATOCRIT: 29.6 % — AB (ref 39.0–52.0)
Hemoglobin: 9.1 g/dL — ABNORMAL LOW (ref 13.0–17.0)
MCH: 28.1 pg (ref 26.0–34.0)
MCHC: 30.7 g/dL (ref 30.0–36.0)
MCV: 91.4 fL (ref 78.0–100.0)
PLATELETS: 386 10*3/uL (ref 150–400)
RBC: 3.24 MIL/uL — AB (ref 4.22–5.81)
RDW: 15.9 % — AB (ref 11.5–15.5)
WBC: 6.5 10*3/uL (ref 4.0–10.5)

## 2017-03-16 LAB — GLUCOSE, CAPILLARY: Glucose-Capillary: 152 mg/dL — ABNORMAL HIGH (ref 65–99)

## 2017-03-16 MED ORDER — CEFPODOXIME PROXETIL 200 MG PO TABS
200.0000 mg | ORAL_TABLET | Freq: Two times a day (BID) | ORAL | Status: DC
  Administered 2017-03-16 – 2017-03-17 (×3): 200 mg via ORAL
  Filled 2017-03-16 (×4): qty 1

## 2017-03-16 NOTE — Progress Notes (Signed)
Pt's sacral wound re-dressed, packed with wet-to-dry saline gauze. Covered with ABD pad, and taped. Pt had a BM just prior to bandage change, high hopes it remains clean/intact for next change at 5 am.

## 2017-03-16 NOTE — Progress Notes (Signed)
  Attempted to see, patient was in transport to new unit. Will follow as indicated. Charlotte Crumb, PT DPT [phone]

## 2017-03-16 NOTE — Progress Notes (Signed)
Back charted completed transfusion from 3/22, on 4/9 due to lack of completion documentation x several weeks.

## 2017-03-16 NOTE — Progress Notes (Signed)
[facility]                                                                           Patient Demographics Jesus [name], is a 71 y.o. male, DOB - September 03, 1946, GNF:[id number] Admit date - 02/24/2017   Admitting Physician Michael Litter, MD Outpatient Primary MD for the patient is [name],[name], [name] Outpatient specialists:  LOS - 20  days  Chief Complaint  Patient presents with  . Altered Mental Status      Brief summary   70 y.o.male with a history of chronic atrial fibrillation (anticoagulated with Eliquis; CHADS-Vasc score of 4), CKD 3, chronic diastolic heart failure, HTN, chronic anemia, chronic pain, chronic venous insufficiency, chronic edema, cirrhosis of the liver by CT, incidental lesions on his liver and pancreas seen on prior CT imaging. He was recently hospitalized from 1/22-2/16 for aspiration pneumonia and infected sacral decubitus ulcer requiring surgical debridement. The wound ultimately required a wound vac and he was referred to [facility]. According to his daughter, he did well there, but has declined quickly since being back at [facility] for less than one week. Patient found to have altered mental status and probable sepsis. Patient was found to have acute hypoxic and hypercarbic respiratory failure, initially admitted to stepdown unit, subsequently yesterday status improved and he was transferred to floor. However on 3/27, patient was again noticed to be lethargic and PCO2 >100 on ABG. He was transferred to stepdown unit and placed on BiPAP. Patient was noticed to have mental status changes and respiratory failure every time he was weaned off of BiPAP. Pulmonology consult was obtained and recommended continuing BiPAP QHS. He was also placed on Lasix for diuresis. Plan for DC to skilled nursing when improved   Assessment & Plan   Acute hypoxic and hypercarbic respiratory failure / Acute pulmonary edema - CXR on admission showed CHF with pulmonary interstitial edema and  small right pleural effusion - due to third spacing from low albumin/cirrhosis and diastolic CHF - on admission, patient was placed on BiPAP, subsequently his respiratory status was improved and patient was transferred to the floor, then on 3/27, patient was noticed to be lethargic, not following commands, ABG showed pH of 7.166 pCO2 102 - Patient was transferred back to stepdown unit and placed on BiPAP (DNR status). - May have underlying OSA/obesity hypoventilation, will need outpatient sleep study to qualify for CPAP. - Palliative medicine following, plan for SNF with Palliative care - Patient was placed on BiPAP again on 3/30 due to hypoxic respiratory failure. He remained on BiPAP overnight however still lethargic, hence PCCM consulted, recommended aggressive BiPAP qhs and prn during the day, Lasix for diuresis - Patient has been improving with BiPAP qhs, he will need to be discharged on BiPAP QHS (settings 10/5. Rate 12, 3-4L O2 flow, Fio2 30%) at skilled nursing facility along with diuretics -s/p Palliative meeting, DNR and plan for DC to SNF when stable with Palliative follow up -changed to PO lasix-has third spacing from hypoalbuminemia due to liver cirrhosis/malnutrition,  -transfer to tele, discussed poor prognosis with pt and daughter this am -plan to DC to SNF tomorrow  Acute metabolic encephalopathy likely due  to #1, underlying dementia  - improved - Ammonia level was normal, - EEG showed mild to moderate diffuse slowing, no seizures - MRI was canceled due to patient's agitation. Neurology signed off, recommended no further work-up. -mentation improved, now also has catheter related UTI  UTI/fever -febrile and lethargic 72hours ago, 4/4-4/5 -changed foley catheter 4/5, UA suggestive of infection-started Ceftraixone 4/5, now change to Po vantin -PICC fell out, now with PiV which came out -CXR unremarkable  Acute on Chronic diastolic heart failure - 2 D ECHO in 12/2014 showed  EF 55-60% - diuresed with IV lasix, now change to PO as above - negative 15.3L  Severe Hypokalemia -due to diuretics, freq stools -replaced  Sepsissecondary to catheter associated Providenicia stuartii UTI as well as cellulitis and superimposed infection involving chronic nonhealing sacral decubitus ulcer, both present on admission. - Urine cx grew Providencia, Blood cx so far showed no growth - Pt was on vanco and zosyn but this was narrowed down to Rocephin 3/22, until 3/31  - Sepsis physiology has resolved  Mood disorder - Continue Depakote   Chronic atrial fibrillation / Coagulopathy  - CHADS vasc score 4 - Continue anticoagulation with apixaban - Rate controlled without beta blockers  Anemia of chronic disease - Due to bone marrow suppression from history of liver cirrhosis - Has had 2 units PRBC transfusion on 3/22 - transfuse for hemoglobin less than 7.5, stable now  Diarrhea - C. difficile negative - No diarrhea in past   Liver cirrhosis - Continue lactulose  - Continue protonix   Multiple wounds, sacral wounds, stage 4 sacral decubitus ulcer - Stage IV sacrum, partial thickness buttocks, bilateral pretibial and calves, left lat foot, left inner thigh. Unstageable to bilateral heels, and lateral plantar surface of R foot, DTI to L medial heel - Sacrum 15cm x 18cm x 6cm deep stage 4 - Buttocks with multiple various size partial thickness wounds - Unstageable on Lateral plantar surface of R foot 1.5cm x 2cm x 0cm  - Unstageable R heel 2.5cm x 6cm x 0cm - R lateral calf 6cm x 3cm x 0.1cm partial thickness 5 - Proximal to right ankle 2.5cm x 0.5cm x 0cm dark purple.  - Two intact blisters to inner left thigh 6cm x 1.5cm and 3cm x 1cm - Left lateral calf 6cm x 3cm x 0.1cm partial thickness and one 3cm  - Left medial heel 2.5cm x 2.5cm  - Left lateral foot 4cm x 4cm and a 2cm x 4cm  - Left posterior calf with 4cm x 3cm x 0cm  - Dressing  procedure/placement/frequency: NS moistened W to D dressing BID for sacral ulcer. Xeroform and foam to wounds on heels, foam to intact blisters, xeroform and gauze to partial thickness on BLE calves. Pt is already on a specialty bed. Pt already has pressure alleviating boots on.   Code Status: DNR  DVT Prophylaxis:  apixaban Family Communication: no family at bedside, called and d/w sister who was at bedside 71/5, and daughter 4/9 Disposition Plan: SNF tomorrow. Will definitely need BiPAP qhs, when more stable  Time Spent in minutes   25  minutes  Procedures:  BiPAP  Consultants:   Palliative care Neurology  Pulmonary critical care  Antimicrobials:   Rocephin 3/22-> 3/31  Vanco and zosyn 3/20 --> 3/22    Medications  Scheduled Meds: . cefTRIAXone (ROCEPHIN)  IV  1 g Intravenous Q24H  . chlorhexidine  15 mL Mouth Rinse BID  . divalproex  250 mg Oral Q12H  .  feeding supplement (ENSURE ENLIVE)  237 mL Oral BID BM  . feeding supplement (PRO-STAT SUGAR FREE 64)  30 mL Oral BID  . furosemide  40 mg Oral BID  . lactulose  20 g Oral BID  . mouth rinse  15 mL Mouth Rinse BID  . memantine  5 mg Oral BID  . pantoprazole  40 mg Oral QHS  . potassium chloride  40 mEq Oral BID  . sodium chloride flush  10-40 mL Intracatheter Q12H   Continuous Infusions:  PRN Meds:.acetaminophen **OR** acetaminophen, haloperidol lactate, hydrocortisone cream, ondansetron **OR** ondansetron (ZOFRAN) IV, sodium chloride flush   Antibiotics   Anti-infectives    Start     Dose/Rate Route Frequency Ordered Stop   03/12/17 1500  cefTRIAXone (ROCEPHIN) 1 g in dextrose 5 % 50 mL IVPB     1 g 100 mL/hr over 30 Minutes Intravenous Every 24 hours 03/12/17 1411     02/27/17 1400  cefTRIAXone (ROCEPHIN) 1 g in dextrose 5 % 50 mL IVPB  Status:  Discontinued     1 g 100 mL/hr over 30 Minutes Intravenous Every 24 hours 02/27/17 1245 03/08/17 0926   02/26/17 2000  vancomycin (VANCOCIN) IVPB 1000 mg/200 mL  premix  Status:  Discontinued     1,000 mg 200 mL/hr over 60 Minutes Intravenous Every 24 hours 02/26/17 0809 02/27/17 1245   02/25/17 0800  vancomycin (VANCOCIN) IVPB 1000 mg/200 mL premix  Status:  Discontinued     1,000 mg 200 mL/hr over 60 Minutes Intravenous Every 12 hours 02/24/17 2102 02/26/17 0809   02/25/17 0600  piperacillin-tazobactam (ZOSYN) IVPB 3.375 g  Status:  Discontinued     3.375 g 12.5 mL/hr over 240 Minutes Intravenous Every 8 hours 02/24/17 2102 02/27/17 1245   02/24/17 1930  vancomycin (VANCOCIN) 2,000 mg in sodium chloride 0.9 % 500 mL IVPB     2,000 mg 250 mL/hr over 120 Minutes Intravenous  Once 02/24/17 1915 02/24/17 2201   02/24/17 1915  piperacillin-tazobactam (ZOSYN) IVPB 3.375 g     3.375 g 100 mL/hr over 30 Minutes Intravenous  Once 02/24/17 1907 02/24/17 2359   02/24/17 1915  vancomycin (VANCOCIN) IVPB 1000 mg/200 mL premix  Status:  Discontinued     1,000 mg 200 mL/hr over 60 Minutes Intravenous  Once 02/24/17 1907 02/24/17 1915   02/24/17 1815  cefTRIAXone (ROCEPHIN) injection 1 g  Status:  Discontinued     1 g Intramuscular  Once 02/24/17 1801 02/24/17 1907        Subjective:   Shubham Stucke was seen and examined today. More alert, low grade fever last pm awaiting access  Objective:   Vitals:   03/15/17 2335 03/16/17 0305 03/16/17 0500 03/16/17 0720  BP: (!) 154/37 (!) 106/54  (!) 125/36  Pulse: 83 76  80  Resp: (!) 28 (!) 22  (!) 32  Temp: 99.2 F (37.3 C) 99.4 F (37.4 C)  97.6 F (36.4 C)  TempSrc: Oral Oral  Oral  SpO2: 99% 91%  92%  Weight:   116.1 kg (255 lb 15.3 oz)   Height:        Intake/Output Summary (Last 24 hours) at 03/16/17 1227 Last data filed at 03/16/17 0931  Gross per 24 hour  Intake              840 ml  Output             1700 ml  Net             -  860 ml     Wt Readings from Last 3 Encounters:  03/16/17 116.1 kg (255 lb 15.3 oz)  02/17/17 136.1 kg (300 lb)  02/16/17 (!) 136.2 kg (300 lb 3.2 oz)      Exam  General: less  alert but awake, oriented to self, place, no distress, obese, chronically ill appearing  Neck: Supple, no JVD,   Cardiovascular: S1 S2 clear, RRR  Respiratory: Clear to auscultation bilaterally anteriorly, no wheezing  Gastrointestinal: Morbidly obese, Soft, nontender, nondistended, + bowel sounds  Ext: no cyanosis clubbing   Neuro: more alert today  Skin: Pressure boots on bilaterally on the heels, fluid filled blisters on arms, lower legs, small ulcers etc  Psych: appropriate   Data Reviewed:  I have personally reviewed following labs and imaging studies  Micro Results Recent Results (from the past 240 hour(s))  Culture, blood (routine x 2)     Status: None (Preliminary result)   Collection Time: 03/12/17 10:29 AM  Result Value Ref Range Status   Specimen Description BLOOD RIGHT HAND  Final   Special Requests IN PEDIATRIC BOTTLE Blood Culture adequate volume  Final   Culture NO GROWTH 3 DAYS  Final   Report Status PENDING  Incomplete  Culture, blood (routine x 2)     Status: None (Preliminary result)   Collection Time: 03/12/17 10:29 AM  Result Value Ref Range Status   Specimen Description BLOOD RIGHT HAND  Final   Special Requests IN PEDIATRIC BOTTLE Blood Culture adequate volume  Final   Culture NO GROWTH 3 DAYS  Final   Report Status PENDING  Incomplete  Culture, Urine     Status: None   Collection Time: 03/12/17 12:38 PM  Result Value Ref Range Status   Specimen Description URINE, RANDOM  Final   Special Requests NONE  Final   Culture NO GROWTH  Final   Report Status 03/14/2017 FINAL  Final    Radiology Reports Dg Chest 1 View  Result Date: 03/12/2017 CLINICAL DATA:  Fever EXAM: CHEST 1 VIEW COMPARISON:  CT and chest radiography 1 week ago. FINDINGS: Chronic cardiomegaly. Chronic aortic atherosclerosis. Right arm PICC tip in the SVC at the azygos level. Left chest is clear except for minimal atelectasis in the lower lobe. Right  chest shows hazy density with volume loss in the right lower lobe, probably related to a right effusion with right lower lobe volume loss. Appearance is improved compared to the previous studies. No worsening or new findings. Old rib fractures on the right. IMPRESSION: Radiographic improvement. Some persistent volume loss in the lower lobes, right more than left. Electronically Signed   By: Paulina Fusi M.D.   On: 03/12/2017 11:33   Ct Chest Wo Contrast  Result Date: 03/07/2017 CLINICAL DATA:  Acute on chronic respiratory failure. Hypoxia and hypercapnia. History of CHF, atrial fibrillation, pulmonary vascular congestion, sepsis. EXAM: CT CHEST WITHOUT CONTRAST TECHNIQUE: Multidetector CT imaging of the chest was performed following the standard protocol without IV contrast. COMPARISON:  Chest radiograph March 07, 2017 at 1523 hours FINDINGS: Cardiovascular: The heart is moderately enlarged. No pericardial effusions. Severe coronary artery calcifications. Ascending aorta is 4.3 by 4.7 cm in transaxial dimension with moderate calcific atherosclerosis. RIGHT PICC distal tip in the upper superior vena cava. Mediastinum/Nodes: No lymphadenopathy by CT size criteria though sensitivity decreased without intravenous contrast. Lungs/Pleura: Small to moderate RIGHT and small LEFT layering pleural effusions with underlying consolidation. Scattered calcified granulomata. Patchy to RIGHT middle lobe, RIGHT lower lobe consolidation with  air bronchograms. Heterogeneous lung attenuation. No pulmonary masses, limited assessment for pulmonary nodule due to respiratory motion and habitus. Upper Abdomen: Nonacute. Streak artifact through upper abdomen, patient was scanned with arms at side. 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 Old RIGHT rib fractures. Moderate degenerative change of thoracic spine. IMPRESSION: Small to moderate RIGHT and small LEFT pleural effusions. RIGHT middle and RIGHT  lower lobe suspected pneumonia. Cardiomegaly.  Findings of small airway disease. **An incidental finding of potential clinical significance has been found. 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 Ascending thoracic aortic aneurysm. Recommend semi-annual imaging followup by CTA or MRA and referral to cardiothoracic surgery if not already obtained. This recommendation follows 2010 ACCF/AHA/AATS/ACR/ASA/SCA/SCAI/SIR/STS/SVM Guidelines for the Diagnosis and Management of Patients With Thoracic Aortic Disease. Circulation. 2010; 121: Z610-R604** Electronically Signed   By: Awilda Metro M.D.   On: 03/07/2017 22:01   Dg Chest Port 1 View  Result Date: 03/07/2017 CLINICAL DATA:  Respiratory failure, history CHF, hypertension, chronic atrial fibrillation, morbid obesity EXAM: PORTABLE CHEST 1 VIEW COMPARISON:  Portable exam 1523 hours compared to 02/26/2017 FINDINGS: RIGHT arm PICC line tip projects over SVC. Enlargement of cardiac silhouette with pulmonary vascular congestion. Increased pulmonary infiltrates particularly RIGHT with minimal RIGHT pleural effusion, likely representing pulmonary edema and CHF. No pneumothorax. Visualized osseous structures unremarkable. IMPRESSION: Enlargement of cardiac silhouette with pulmonary vascular congestion and probable mild pulmonary edema/CHF. Electronically Signed   By: Ulyses Southward M.D.   On: 03/07/2017 15:40   Dg Chest Port 1 View  Result Date: 02/26/2017 CLINICAL DATA:  PICC line. EXAM: PORTABLE CHEST 1 VIEW COMPARISON:  02/24/2017 . FINDINGS: PICC line noted with tip over the cavoatrial junction. Cardiomegaly with pulmonary interstitial prominence. Small right pleural effusion. No pneumothorax . IMPRESSION: 1. PICC line noted with tip projected over the cavoatrial junction. 2. Congestive heart failure with pulmonary interstitial edema and small right pleural effusion . Electronically Signed   By: Maisie Fus  Register   On: 02/26/2017 11:37   Dg Chest Port 1  View  Result Date: 02/24/2017 CLINICAL DATA:  Altered mental status EXAM: PORTABLE CHEST 1 VIEW COMPARISON:  January 20, 2017 FINDINGS: There is persistent cardiomegaly. There is pulmonary venous hypertension. There is no edema or consolidation. No adenopathy. No bone lesions. IMPRESSION: Pulmonary vascular congestion without edema or consolidation. Electronically Signed   By: Bretta Bang III M.D.   On: 02/24/2017 15:08    Lab Data:  CBC:  Recent Labs Lab 03/14/17 0755 03/15/17 0937 03/16/17 0323  WBC 5.7 6.5 6.5  HGB 8.4* 9.2* 9.1*  HCT 27.3* 29.0* 29.6*  MCV 91.0 91.2 91.4  PLT 300 373 386   Basic Metabolic Panel:  Recent Labs Lab 03/10/17 0538 03/11/17 0312 03/14/17 0755 03/15/17 0937 03/16/17 0323  NA  --   --  132* 134* 135  K  --   --  2.4* 4.1 3.5  CL  --   --  85* 88* 87*  CO2  --   --  38* 33* 37*  GLUCOSE  --   --  94 82 100*  BUN  --   --  CREATININE  --   --  0.70 0.78 0.81  CALCIUM  --   --  8.4* 8.5* 8.7*  MG 1.7 1.7  --   --   --   PHOS 3.3 2.6  --   --   --    GFR: Estimated Creatinine Clearance: 108.3 mL/min (by C-G formula based  on SCr of 0.81 mg/dL). Liver Function Tests: No results for input(s): AST, ALT, ALKPHOS, BILITOT, PROT, ALBUMIN in the last 168 hours. No results for input(s): LIPASE, AMYLASE in the last 168 hours. No results for input(s): AMMONIA in the last 168 hours. Coagulation Profile:  Recent Labs Lab 03/14/17 0939  INR 1.71   Cardiac Enzymes: No results for input(s): CKTOTAL, CKMB, CKMBINDEX, TROPONINI in the last 168 hours. BNP (last 3 results) No results for input(s): PROBNP in the last 8760 hours. HbA1C: No results for input(s): HGBA1C in the last 72 hours. CBG:  Recent Labs Lab 03/13/17 1707  GLUCAP 95   Lipid Profile: No results for input(s): CHOL, HDL, LDLCALC, TRIG, CHOLHDL, LDLDIRECT in the last 72 hours. Thyroid Function Tests: No results for input(s): TSH, T4TOTAL, FREET4, T3FREE,  THYROIDAB in the last 72 hours. Anemia Panel: No results for input(s): VITAMINB12, FOLATE, FERRITIN, TIBC, IRON, RETICCTPCT in the last 72 hours. Urine analysis:    Component Value Date/Time   COLORURINE AMBER (A) 03/12/2017 1238   APPEARANCEUR CLOUDY (A) 03/12/2017 1238   LABSPEC 1.018 03/12/2017 1238   PHURINE 5.0 03/12/2017 1238   GLUCOSEU NEGATIVE 03/12/2017 1238   HGBUR MODERATE (A) 03/12/2017 1238   BILIRUBINUR NEGATIVE 03/12/2017 1238   KETONESUR NEGATIVE 03/12/2017 1238   PROTEINUR 100 (A) 03/12/2017 1238   UROBILINOGEN 4.0 (H) 06/02/2012 1200   NITRITE NEGATIVE 03/12/2017 1238   LEUKOCYTESUR LARGE (A) 03/12/2017 1238     July Linam M.D. [facility] 03/16/2017, 12:27 PM  Pager: [phone] Between 7am to 7pm - text page via AMION - [phone]  After 7pm go to [email or web address] - password [email or web address], Call night coverage person covering after 7pm

## 2017-03-16 NOTE — Care Management Important Message (Signed)
Important Message  Patient Details  Name: Jesus [name] MRN: [id number] Date of Birth: [birth year]   Medicare Important Message Given:  Yes    Kyla Balzarine 03/16/2017, 4:44 PM

## 2017-03-17 DIAGNOSIS — R531 Weakness: Secondary | ICD-10-CM | POA: Diagnosis not present

## 2017-03-17 DIAGNOSIS — J810 Acute pulmonary edema: Secondary | ICD-10-CM | POA: Diagnosis not present

## 2017-03-17 DIAGNOSIS — L89150 Pressure ulcer of sacral region, unstageable: Secondary | ICD-10-CM | POA: Diagnosis not present

## 2017-03-17 DIAGNOSIS — L8992 Pressure ulcer of unspecified site, stage 2: Secondary | ICD-10-CM | POA: Diagnosis not present

## 2017-03-17 DIAGNOSIS — R238 Other skin changes: Secondary | ICD-10-CM | POA: Diagnosis not present

## 2017-03-17 DIAGNOSIS — M4628 Osteomyelitis of vertebra, sacral and sacrococcygeal region: Secondary | ICD-10-CM | POA: Diagnosis not present

## 2017-03-17 DIAGNOSIS — J189 Pneumonia, unspecified organism: Secondary | ICD-10-CM | POA: Diagnosis not present

## 2017-03-17 DIAGNOSIS — A4901 Methicillin susceptible Staphylococcus aureus infection, unspecified site: Secondary | ICD-10-CM | POA: Diagnosis not present

## 2017-03-17 DIAGNOSIS — J80 Acute respiratory distress syndrome: Secondary | ICD-10-CM | POA: Diagnosis not present

## 2017-03-17 DIAGNOSIS — M6281 Muscle weakness (generalized): Secondary | ICD-10-CM | POA: Diagnosis not present

## 2017-03-17 DIAGNOSIS — R1311 Dysphagia, oral phase: Secondary | ICD-10-CM | POA: Diagnosis not present

## 2017-03-17 DIAGNOSIS — I5032 Chronic diastolic (congestive) heart failure: Secondary | ICD-10-CM | POA: Diagnosis not present

## 2017-03-17 DIAGNOSIS — R7881 Bacteremia: Secondary | ICD-10-CM | POA: Diagnosis not present

## 2017-03-17 DIAGNOSIS — R600 Localized edema: Secondary | ICD-10-CM | POA: Diagnosis not present

## 2017-03-17 DIAGNOSIS — R102 Pelvic and perineal pain: Secondary | ICD-10-CM | POA: Diagnosis not present

## 2017-03-17 DIAGNOSIS — M79605 Pain in left leg: Secondary | ICD-10-CM | POA: Diagnosis not present

## 2017-03-17 DIAGNOSIS — K7460 Unspecified cirrhosis of liver: Secondary | ICD-10-CM | POA: Diagnosis not present

## 2017-03-17 DIAGNOSIS — R293 Abnormal posture: Secondary | ICD-10-CM | POA: Diagnosis not present

## 2017-03-17 DIAGNOSIS — J9621 Acute and chronic respiratory failure with hypoxia: Secondary | ICD-10-CM | POA: Diagnosis not present

## 2017-03-17 DIAGNOSIS — R41841 Cognitive communication deficit: Secondary | ICD-10-CM | POA: Diagnosis not present

## 2017-03-17 DIAGNOSIS — F0280 Dementia in other diseases classified elsewhere without behavioral disturbance: Secondary | ICD-10-CM | POA: Diagnosis not present

## 2017-03-17 DIAGNOSIS — J9622 Acute and chronic respiratory failure with hypercapnia: Secondary | ICD-10-CM | POA: Diagnosis not present

## 2017-03-17 DIAGNOSIS — D649 Anemia, unspecified: Secondary | ICD-10-CM | POA: Diagnosis not present

## 2017-03-17 DIAGNOSIS — R278 Other lack of coordination: Secondary | ICD-10-CM | POA: Diagnosis not present

## 2017-03-17 DIAGNOSIS — K219 Gastro-esophageal reflux disease without esophagitis: Secondary | ICD-10-CM | POA: Diagnosis not present

## 2017-03-17 DIAGNOSIS — Z452 Encounter for adjustment and management of vascular access device: Secondary | ICD-10-CM | POA: Diagnosis not present

## 2017-03-17 DIAGNOSIS — F0391 Unspecified dementia with behavioral disturbance: Secondary | ICD-10-CM | POA: Diagnosis not present

## 2017-03-17 DIAGNOSIS — L89890 Pressure ulcer of other site, unstageable: Secondary | ICD-10-CM | POA: Diagnosis not present

## 2017-03-17 DIAGNOSIS — R5381 Other malaise: Secondary | ICD-10-CM | POA: Diagnosis not present

## 2017-03-17 DIAGNOSIS — R609 Edema, unspecified: Secondary | ICD-10-CM | POA: Diagnosis not present

## 2017-03-17 DIAGNOSIS — I4891 Unspecified atrial fibrillation: Secondary | ICD-10-CM | POA: Diagnosis not present

## 2017-03-17 DIAGNOSIS — D721 Eosinophilia: Secondary | ICD-10-CM | POA: Diagnosis not present

## 2017-03-17 DIAGNOSIS — N390 Urinary tract infection, site not specified: Secondary | ICD-10-CM | POA: Diagnosis not present

## 2017-03-17 DIAGNOSIS — R4182 Altered mental status, unspecified: Secondary | ICD-10-CM | POA: Diagnosis not present

## 2017-03-17 DIAGNOSIS — E785 Hyperlipidemia, unspecified: Secondary | ICD-10-CM | POA: Diagnosis not present

## 2017-03-17 DIAGNOSIS — J9600 Acute respiratory failure, unspecified whether with hypoxia or hypercapnia: Secondary | ICD-10-CM | POA: Diagnosis not present

## 2017-03-17 DIAGNOSIS — Z22322 Carrier or suspected carrier of Methicillin resistant Staphylococcus aureus: Secondary | ICD-10-CM | POA: Diagnosis not present

## 2017-03-17 DIAGNOSIS — I872 Venous insufficiency (chronic) (peripheral): Secondary | ICD-10-CM | POA: Diagnosis not present

## 2017-03-17 DIAGNOSIS — L89154 Pressure ulcer of sacral region, stage 4: Secondary | ICD-10-CM | POA: Diagnosis not present

## 2017-03-17 DIAGNOSIS — J9601 Acute respiratory failure with hypoxia: Secondary | ICD-10-CM | POA: Diagnosis not present

## 2017-03-17 DIAGNOSIS — J9611 Chronic respiratory failure with hypoxia: Secondary | ICD-10-CM | POA: Diagnosis not present

## 2017-03-17 DIAGNOSIS — J9602 Acute respiratory failure with hypercapnia: Secondary | ICD-10-CM | POA: Diagnosis not present

## 2017-03-17 DIAGNOSIS — M79604 Pain in right leg: Secondary | ICD-10-CM | POA: Diagnosis not present

## 2017-03-17 DIAGNOSIS — G4733 Obstructive sleep apnea (adult) (pediatric): Secondary | ICD-10-CM | POA: Diagnosis not present

## 2017-03-17 DIAGNOSIS — L299 Pruritus, unspecified: Secondary | ICD-10-CM | POA: Diagnosis not present

## 2017-03-17 DIAGNOSIS — Z9989 Dependence on other enabling machines and devices: Secondary | ICD-10-CM | POA: Diagnosis not present

## 2017-03-17 DIAGNOSIS — I509 Heart failure, unspecified: Secondary | ICD-10-CM | POA: Diagnosis not present

## 2017-03-17 DIAGNOSIS — L89309 Pressure ulcer of unspecified buttock, unspecified stage: Secondary | ICD-10-CM | POA: Diagnosis not present

## 2017-03-17 DIAGNOSIS — R195 Other fecal abnormalities: Secondary | ICD-10-CM | POA: Diagnosis not present

## 2017-03-17 DIAGNOSIS — M24561 Contracture, right knee: Secondary | ICD-10-CM | POA: Diagnosis not present

## 2017-03-17 DIAGNOSIS — L89610 Pressure ulcer of right heel, unstageable: Secondary | ICD-10-CM | POA: Diagnosis not present

## 2017-03-17 DIAGNOSIS — R488 Other symbolic dysfunctions: Secondary | ICD-10-CM | POA: Diagnosis not present

## 2017-03-17 LAB — CBC
HEMATOCRIT: 30.5 % — AB (ref 39.0–52.0)
HEMOGLOBIN: 9.6 g/dL — AB (ref 13.0–17.0)
MCH: 28.8 pg (ref 26.0–34.0)
MCHC: 31.5 g/dL (ref 30.0–36.0)
MCV: 91.6 fL (ref 78.0–100.0)
Platelets: 356 10*3/uL (ref 150–400)
RBC: 3.33 MIL/uL — AB (ref 4.22–5.81)
RDW: 16.3 % — ABNORMAL HIGH (ref 11.5–15.5)
WBC: 8.2 10*3/uL (ref 4.0–10.5)

## 2017-03-17 LAB — BASIC METABOLIC PANEL
Anion gap: 9 (ref 5–15)
BUN: 16 mg/dL (ref 6–20)
CHLORIDE: 90 mmol/L — AB (ref 101–111)
CO2: 36 mmol/L — AB (ref 22–32)
Calcium: 8.6 mg/dL — ABNORMAL LOW (ref 8.9–10.3)
Creatinine, Ser: 0.91 mg/dL (ref 0.61–1.24)
GFR calc non Af Amer: >60 mL/min (ref >60)
Glucose, Bld: 93 mg/dL (ref 65–99)
POTASSIUM: 3.3 mmol/L — AB (ref 3.5–5.1)
SODIUM: 135 mmol/L (ref 135–145)

## 2017-03-17 LAB — CULTURE, BLOOD (ROUTINE X 2)
Culture: NO GROWTH
Culture: NO GROWTH
Special Requests: ADEQUATE
Special Requests: ADEQUATE

## 2017-03-17 MED ORDER — POTASSIUM CHLORIDE ER 20 MEQ PO TBCR: 40.0000 meq | EXTENDED_RELEASE_TABLET | Freq: Two times a day (BID) | ORAL | Status: DC

## 2017-03-17 MED ORDER — FUROSEMIDE 40 MG PO TABS: 40.0000 mg | ORAL_TABLET | Freq: Two times a day (BID) | ORAL | Status: DC

## 2017-03-17 MED ORDER — HYDROCODONE-ACETAMINOPHEN 5-325 MG PO TABS: 1.0000 | ORAL_TABLET | Freq: Four times a day (QID) | ORAL | 0 refills | Status: DC | PRN

## 2017-03-17 MED ORDER — CEFPODOXIME PROXETIL 200 MG PO TABS: 200.0000 mg | ORAL_TABLET | Freq: Two times a day (BID) | ORAL | Status: DC

## 2017-03-17 MED ORDER — PANTOPRAZOLE SODIUM 40 MG PO TBEC: 40.0000 mg | DELAYED_RELEASE_TABLET | Freq: Every day | ORAL | Status: DC

## 2017-03-17 NOTE — Plan of Care (Signed)
Problem: Acute Rehab PT Goals(only PT should resolve) Goal: Patient Will Perform Sitting Balance Outcome: Not Progressing Pain limiting progress toward goals

## 2017-03-17 NOTE — Progress Notes (Signed)
qPhysical Therapy Treatment Patient Details Name: Jesus [name] MRN: [id number] DOB: [birth year] Today's Date: 03/17/2017    History of Present Illness Patient is a 71 yo male admitted 02/24/17 from [facility] with acute respiratory failure with hypoxia and hypercapnia, AMS, sepsis  UTI/wounds.    PMH:  memory loss, sacral and LE ulcers, CKD, Afib, anemia, CHF, PAD, morbid obesity, HTN    PT Comments    Pain continues to limit patient's ability to progress with mobility.  Continue to recommend SNF at d/c.   Follow Up Recommendations  SNF;Supervision/Assistance - 24 hour     Equipment Recommendations  Other (comment);Hospital bed (Air mattress for wounds; Lift equipment)    Recommendations for Other Services       Precautions / Restrictions Precautions Precautions: Fall Precaution Comments: very painful and contracted bil LEs with wounds on bil lower legs, and sacrum.   Restrictions Weight Bearing Restrictions: No    Mobility  Bed Mobility Overal bed mobility: Needs Assistance Bed Mobility: Rolling Rolling: Total assist;+2 for physical assistance         General bed mobility comments: In supine, worked on shoulder flexion (AAROM).  Then initiated horizontal adduction and head turn to attempt to initiate rolling.  Patient required max assist for UE movement.  Attempted to move LE's to roll, and patient yelling out in pain with movement of both LE's.  Repositioned in bed.  Transfers                 General transfer comment: Unable  Ambulation/Gait                 Stairs            Wheelchair Mobility    Modified Rankin (Stroke Patients Only)       Balance                                            Cognition Arousal/Alertness: Awake/alert Behavior During Therapy: Flat affect Overall Cognitive Status: History of cognitive impairments - at baseline                                        Exercises       General Comments General comments (skin integrity, edema, etc.): Pain from BLE and sacral wounds impacting ability to move.      Pertinent Vitals/Pain Pain Assessment: Faces Faces Pain Scale: Hurts whole lot Pain Location: with movement of R leg greater than left leg.  Pain Descriptors / Indicators: Guarding;Grimacing Pain Intervention(s): Limited activity within patient's tolerance;Repositioned    Home Living                      Prior Function            PT Goals (current goals can now be found in the care plan section) Progress towards PT goals: Not progressing toward goals - comment (Pain limiting mobility)    Frequency    Min 2X/week      PT Plan Current plan remains appropriate    Co-evaluation             End of Session   Activity Tolerance: Patient limited by pain Patient left: in bed;with call bell/phone within reach   PT Visit Diagnosis:  Muscle weakness (generalized) (M62.81);Difficulty in walking, not elsewhere classified (R26.2);Pain Pain - Right/Left:  (Bilateral) Pain - part of body: Leg     Time: 1610-9604 PT Time Calculation (min) (ACUTE ONLY): 9 min  Charges:  $Therapeutic Activity: 8-22 mins                    G Codes:       Durenda Hurt. Renaldo Fiddler, [facility] Acute Rehab Services Pager [phone]    Vena Austria 03/17/2017, 7:25 PM

## 2017-03-17 NOTE — Clinical Social Work Note (Signed)
CSW facilitated patient discharge including contacting patient family and facility to confirm patient discharge plans. Clinical information faxed to facility and family agreeable with plan. CSW arranged ambulance transport via [facility] to [facility]. RN to call report prior to discharge ([phone]).  CSW will sign off for now as social work intervention is no longer needed. Please consult us again if new needs arise.  Tais Koestner, CSW [phone]   

## 2017-03-17 NOTE — Progress Notes (Signed)
Patient report given to nurse Lillia Abed at [facility]. Elnita Maxwell, RN

## 2017-03-17 NOTE — Discharge Summary (Signed)
Physician Discharge Summary  Jesus [name] MWU:[id number] DOB: 1946-03-18 DOA: 02/24/2017  PCP: Miki Kins  Admit date: 02/24/2017 Discharge date: 03/17/2017  Time spent: 35 minutes  Recommendations for Outpatient Follow-up:  1. Please Continue BiPAP QHS (settings 10/5. Rate 12, 3-4L O2 flow, Fio2 30%) at skilled nursing facility 2. Has extensive wounds on sacrum/legs, arms etc-need wound care/q2H turning 3. PLEASE ensure PALLIATIVE CARE FU at facility-prognosis very poor   Discharge Diagnoses:  Principal Problem:   Acute respiratory failure with hypoxia and hypercapnia (HCC) Active Problems:   Atrial fibrillation (HCC)   Peripheral vascular disease (HCC)   Peripheral edema   Chronic diastolic CHF (congestive heart failure) (HCC)   Decubitus ulcer of sacral region, unstageable (HCC)   Acute encephalopathy   Goals of care, counseling/discussion   Palliative care encounter   Acute pulmonary edema (HCC)   Sepsis secondary to UTI (HCC)   Hypoxemia   OSA on CPAP   Discharge Condition: poor  Diet recommendation: low sodium heart healthy  Filed Weights   03/15/17 0500 03/16/17 0500 03/17/17 0601  Weight: 116.5 kg (256 lb 13.4 oz) 116.1 kg (255 lb 15.3 oz) 112.9 kg (249 lb)    History of present illness:  71 y.o.male with a history of chronic atrial fibrillation (anticoagulated with Eliquis; CHADS-Vasc score of 4), CKD 3, chronic diastolic heart failure, HTN, chronic anemia, chronic pain, chronic venous insufficiency, chronic edema, cirrhosis of the liver by CT, incidental lesions onhis liver and pancreas seen on prior CT imaging. He was recently hospitalized from 1/22-2/16 for aspiration pneumonia and infected sacral decubitus ulcer requiring surgical debridement. The wound ultimately required a wound vac and he was referred to [facility]. According to his daughter, he did well there, but has declined quickly since being back at [facility] for less than one week. Patient found to  have altered mental status andprobable sepsis. Patient was found to have acute hypoxic and hypercarbic respiratory failure  Hospital Course:   ETHICS: Pt has multiple medical problems including Diastolic CHF, Liver Cirrhosis, severe hypoalbuminemia with third spacing, extensive deep Stage 4 decub ulcers, and 4-5 hospitalizations in 6months, given how extensive his wounds are, with albumin of 1.6, less likely to heal -s/p Palliative meeting, plan for DNR, DC to SNF with Palliative care FU -I discussed very poor prognosis with pt and daughter/sister multiple times and recommended consideration for Hospice when he deteriorates next  Acute hypoxic and hypercarbic respiratory failure / Acute pulmonary edema - CXR on admission showed CHF with pulmonary interstitial edema and small right pleural effusion - due to third spacing from low albumin/cirrhosis and diastolic CHF - on admission, patient was diuresed placed on BiPAP, subsequently his respiratory status was improved and patient was transferred to the floor, then on 3/27, patient was noticed to be lethargic-ABG with pH of 7.166 pCO2 102, Patient was transferred back to stepdown unit and placed on BiPAP (DNR status). - Palliative medicine following, s/p family meeting, prognosis is very poor, plan for SNF with Palliative care - Patient was placed on BiPAP again on 3/30 due to hypoxic respiratory failure. hence PCCM consulted, recommended aggressive BiPAP qhs and prn during the day, Lasix for diuresis - Patient has been improving with BiPAP qhs, he will need to be discharged on BiPAP QHS (settings 10/5. Rate 12, 3-4L O2 flow, Fio2 30%) at skilled nursing facility along with diuretics -s/p Palliative meeting, DNR and plan for DC to SNF when stable with Palliative follow up -changed to PO lasix- -plan to DC  to SNF today  Acute metabolic encephalopathy likely due to #1, underlying dementia  - improved - Ammonia level was normal, - EEG showed mild  to moderate diffuse slowing, no seizures - MRI was canceled due to patient's agitation. Neurology signed off, recommended no further work-up. -mentation improved, now also has catheter related UTI, foley changed on 4/7, 2days left of Abx  UTI/fever -febrile and lethargic 72hours ago, 4/4-4/5 -changed foley catheter 4/5, UA after catheter change highly suggestive of infection, -started Ceftraixone 4/5, now change to Po vantin, unfortunately culture was sent after Abx started -PICC removed -CXR unremarkable -continue vantin for days  Acute on Chronic diastolic heart failure - 2 D ECHO in 12/2014 showed EF 55-60% - diuresed with IV lasix, now change to PO as above - negative 15.3L  Severe Hypokalemia -due to diuretics, freq stools -replaced  Sepsissecondary to catheter associated Providenicia stuartii UTI as well as cellulitis and superimposed infection involving chronic nonhealing sacral decubitus ulcer, both present on admission. - Urine cx grew Providencia, Blood cx so far showed no growth - Pt was on vanco and zosyn but this was narrowed down to Rocephin 3/22, until 3/31  - Sepsis physiology has resolved  Mood disorder - Continue Depakote   Chronic atrial fibrillation / Coagulopathy  - CHADS vasc score 4 - Continue anticoagulation with apixaban - Rate controlled without beta blockers  Anemia of chronic disease - Due to bone marrow suppression from history of liver cirrhosis - Has had 2 units PRBC transfusion on 3/22 - transfuse for hemoglobin less than 7.5, stable now  Liver cirrhosis - Continue lactulose, long h/o ETOH abuse - Continue protonix   Multiple wounds, sacral wounds, stage 4 sacral decubitus ulcer - Stage IV sacrum, partial thickness buttocks, bilateral pretibial and calves, left lat foot, left inner thigh. Unstageable to bilateral heels, and lateral plantar surface of R foot, DTI to L medial heel - Sacrum 15cm x 18cm x 6cm deep stage 4 -  Buttocks with multiple various size partial thickness wounds - Unstageable on Lateral plantar surface of R foot 1.5cm x 2cm x 0cm  - Unstageable R heel 2.5cm x 6cm x 0cm - R lateral calf 6cm x 3cm x 0.1cm partial thickness 5 - Proximal to right ankle 2.5cm x 0.5cm x 0cm dark purple.  - Two intact blisters to inner left thigh 6cm x 1.5cm and 3cm x 1cm - Left lateral calf 6cm x 3cm x 0.1cm partial thickness and one 3cm  - Left medial heel 2.5cm x 2.5cm  - Left lateral foot 4cm x 4cm and a 2cm x 4cm  - Left posterior calf with 4cm x 3cm x 0cm  - Dressing procedure/placement/frequency: NS moistened W to D dressing BID for sacral ulcer. Xeroform and foam to wounds on heels, foam to intact blisters, xeroform and gauze to partial thickness on BLE calves. Pt is already on a specialty bed. Pt already has pressure alleviating boots on.   Code Status: DNR   Discharge Exam: Vitals:   03/16/17 2050 03/17/17 0601  BP: (!) 143/70 132/67  Pulse: (!) 102 81  Resp: 18 18  Temp: 99.8 F (37.7 C) 98.2 F (36.8 C)    General: AAOx2, chronically ill, obese male Cardiovascular: S!S2/RRR Respiratory: Decreased BS at bases  Discharge Instructions   Discharge Instructions    Diet - low sodium heart healthy    Complete by:  As directed    Diet - low sodium heart healthy    Complete by:  As directed    Increase activity slowly    Complete by:  As directed    Increase activity slowly    Complete by:  As directed      Current Discharge Medication List    START taking these medications   Details  cefpodoxime (VANTIN) 200 MG tablet Take 1 tablet (200 mg total) by mouth every 12 (twelve) hours. For 2days    pantoprazole (PROTONIX) 40 MG tablet Take 1 tablet (40 mg total) by mouth at bedtime.    potassium chloride 20 MEQ TBCR Take 40 mEq by mouth 2 (two) times daily.      CONTINUE these medications which have CHANGED   Details  furosemide (LASIX) 40 MG tablet Take 1 tablet (40 mg total) by  mouth 2 (two) times daily. Qty: 30 tablet    HYDROcodone-acetaminophen (NORCO/VICODIN) 5-325 MG tablet Take 1-2 tablets by mouth every 6 (six) hours as needed for moderate pain. Qty: 15 tablet, Refills: 0      CONTINUE these medications which have NOT CHANGED   Details  acetaminophen (TYLENOL) 500 MG tablet Take 500 mg by mouth every 8 (eight) hours as needed for moderate pain (Pain score 4-6/10).    Amino Acids-Protein Hydrolys (FEEDING SUPPLEMENT, PRO-STAT SUGAR FREE 64,) LIQD Take 30 mLs by mouth every 6 (six) hours.    apixaban (ELIQUIS) 5 MG TABS tablet Take 1 tablet (5 mg total) by mouth 2 (two) times daily. Qty: 60 tablet, Refills: 0    atorvastatin (LIPITOR) 80 MG tablet Take 1 tablet (80 mg total) by mouth daily. Qty: 30 tablet, Refills: 0    carvedilol (COREG) 12.5 MG tablet Take 12.5 mg by mouth 2 (two) times daily with a meal.    divalproex (DEPAKOTE ER) 250 MG 24 hr tablet Take 250 mg by mouth every morning.     divalproex (DEPAKOTE SPRINKLE) 125 MG capsule Take 125 mg by mouth at bedtime.    famotidine (PEPCID) 20 MG tablet Take 20 mg by mouth 2 (two) times daily.    lactulose (CHRONULAC) 10 GM/15ML solution Take 20 g by mouth every other day. Give 30 mL     memantine (NAMENDA) 5 MG tablet Take 5 mg by mouth 2 (two) times daily.    Multiple Vitamin (MULTIVITAMIN WITH MINERALS) TABS tablet Take 1 tablet by mouth daily.    saccharomyces boulardii (FLORASTOR) 250 MG capsule Take 250 mg by mouth 2 (two) times daily.     thiamine 100 MG tablet Take 1 tablet (100 mg total) by mouth daily.    Vitamins A & D (VITAMIN A & D) ointment Apply 1 application topically 2 (two) times daily.      STOP taking these medications     diphenhydrAMINE (BENADRYL) 25 MG tablet      HYDROmorphone (DILAUDID) 2 MG tablet      mirtazapine (REMERON) 7.5 MG tablet      OVER THE COUNTER MEDICATION      OVER THE COUNTER MEDICATION      QUEtiapine (SEROQUEL) 100 MG tablet       QUEtiapine (SEROQUEL) 25 MG tablet      torsemide (DEMADEX) 20 MG tablet        Allergies  Allergen Reactions  . Amlodipine Besylate Swelling and Other (See Comments)    "started off low and ended up severe; if, in fact, that is what's causing the swelling" (06/03/12)  . Levaquin [Levofloxacin In D5w] Rash    Contact information for follow-up providers  Rubye Oaks, NP Follow up on 03/23/2017.   Specialty:  Pulmonary Disease Why:  11:30 AM Contact information: [address] [place] [address] [phone]            Contact information for after-discharge care    Destination    [facility] .   Specialty:  Skilled Nursing Facility Contact information: [address] [place] [address] [phone]                   The results of significant diagnostics from this hospitalization (including imaging, microbiology, ancillary and laboratory) are listed below for reference.    Significant Diagnostic Studies: Dg Chest 1 View  Result Date: 03/12/2017 CLINICAL DATA:  Fever EXAM: CHEST 1 VIEW COMPARISON:  CT and chest radiography 1 week ago. FINDINGS: Chronic cardiomegaly. Chronic aortic atherosclerosis. Right arm PICC tip in the SVC at the azygos level. Left chest is clear except for minimal atelectasis in the lower lobe. Right chest shows hazy density with volume loss in the right lower lobe, probably related to a right effusion with right lower lobe volume loss. Appearance is improved compared to the previous studies. No worsening or new findings. Old rib fractures on the right. IMPRESSION: Radiographic improvement. Some persistent volume loss in the lower lobes, right more than left. Electronically Signed   By: Paulina Fusi M.D.   On: 03/12/2017 11:33   Ct Chest Wo Contrast  Result Date: 03/07/2017 CLINICAL DATA:  Acute on chronic respiratory failure. Hypoxia and hypercapnia. History of CHF, atrial fibrillation, pulmonary vascular  congestion, sepsis. EXAM: CT CHEST WITHOUT CONTRAST TECHNIQUE: Multidetector CT imaging of the chest was performed following the standard protocol without IV contrast. COMPARISON:  Chest radiograph March 07, 2017 at 1523 hours FINDINGS: Cardiovascular: The heart is moderately enlarged. No pericardial effusions. Severe coronary artery calcifications. Ascending aorta is 4.3 by 4.7 cm in transaxial dimension with moderate calcific atherosclerosis. RIGHT PICC distal tip in the upper superior vena cava. Mediastinum/Nodes: No lymphadenopathy by CT size criteria though sensitivity decreased without intravenous contrast. Lungs/Pleura: Small to moderate RIGHT and small LEFT layering pleural effusions with underlying consolidation. Scattered calcified granulomata. Patchy to RIGHT middle lobe, RIGHT lower lobe consolidation with air bronchograms. Heterogeneous lung attenuation. No pulmonary masses, limited assessment for pulmonary nodule due to respiratory motion and habitus. Upper Abdomen: Nonacute. Streak artifact through upper abdomen, patient was scanned with arms at side. 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 Old RIGHT rib fractures. Moderate degenerative change of thoracic spine. IMPRESSION: Small to moderate RIGHT and small LEFT pleural effusions. RIGHT middle and RIGHT lower lobe suspected pneumonia. Cardiomegaly.  Findings of small airway disease. **An incidental finding of potential clinical significance has been found. 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 Ascending thoracic aortic aneurysm. Recommend semi-annual imaging followup by CTA or MRA and referral to cardiothoracic surgery if not already obtained. This recommendation follows 2010 ACCF/AHA/AATS/ACR/ASA/SCA/SCAI/SIR/STS/SVM Guidelines for the Diagnosis and Management of Patients With Thoracic Aortic Disease. Circulation. 2010; 121: [id number]** Electronically Signed   By: Awilda Metro M.D.   On: 03/07/2017 22:01    Dg Chest Port 1 View  Result Date: 03/07/2017 CLINICAL DATA:  Respiratory failure, history CHF, hypertension, chronic atrial fibrillation, morbid obesity EXAM: PORTABLE CHEST 1 VIEW COMPARISON:  Portable exam 1523 hours compared to 02/26/2017 FINDINGS: RIGHT arm PICC line tip projects over SVC. Enlargement of cardiac silhouette with pulmonary vascular congestion. Increased pulmonary infiltrates particularly RIGHT with minimal RIGHT pleural effusion, likely representing pulmonary edema  and CHF. No pneumothorax. Visualized osseous structures unremarkable. IMPRESSION: Enlargement of cardiac silhouette with pulmonary vascular congestion and probable mild pulmonary edema/CHF. Electronically Signed   By: Ulyses Southward M.D.   On: 03/07/2017 15:40   Dg Chest Port 1 View  Result Date: 02/26/2017 CLINICAL DATA:  PICC line. EXAM: PORTABLE CHEST 1 VIEW COMPARISON:  02/24/2017 . FINDINGS: PICC line noted with tip over the cavoatrial junction. Cardiomegaly with pulmonary interstitial prominence. Small right pleural effusion. No pneumothorax . IMPRESSION: 1. PICC line noted with tip projected over the cavoatrial junction. 2. Congestive heart failure with pulmonary interstitial edema and small right pleural effusion . Electronically Signed   By: Maisie Fus  Register   On: 02/26/2017 11:37   Dg Chest Port 1 View  Result Date: 02/24/2017 CLINICAL DATA:  Altered mental status EXAM: PORTABLE CHEST 1 VIEW COMPARISON:  January 20, 2017 FINDINGS: There is persistent cardiomegaly. There is pulmonary venous hypertension. There is no edema or consolidation. No adenopathy. No bone lesions. IMPRESSION: Pulmonary vascular congestion without edema or consolidation. Electronically Signed   By: Bretta Bang III M.D.   On: 02/24/2017 15:08    Microbiology: Recent Results (from the past 240 hour(s))  Culture, blood (routine x 2)     Status: None   Collection Time: 03/12/17 10:29 AM  Result Value Ref Range Status   Specimen  Description BLOOD RIGHT HAND  Final   Special Requests IN PEDIATRIC BOTTLE Blood Culture adequate volume  Final   Culture NO GROWTH 5 DAYS  Final   Report Status 03/17/2017 FINAL  Final  Culture, blood (routine x 2)     Status: None   Collection Time: 03/12/17 10:29 AM  Result Value Ref Range Status   Specimen Description BLOOD RIGHT HAND  Final   Special Requests IN PEDIATRIC BOTTLE Blood Culture adequate volume  Final   Culture NO GROWTH 5 DAYS  Final   Report Status 03/17/2017 FINAL  Final  Culture, Urine     Status: None   Collection Time: 03/12/17 12:38 PM  Result Value Ref Range Status   Specimen Description URINE, RANDOM  Final   Special Requests NONE  Final   Culture NO GROWTH  Final   Report Status 03/14/2017 FINAL  Final     Labs: Basic Metabolic Panel:  Recent Labs Lab 03/11/17 0312 03/14/17 0755 03/15/17 0937 03/16/17 0323 03/17/17 0747  NA  --  132* 134* 135 135  K  --  2.4* 4.1 3.5 3.3*  CL  --  85* 88* 87* 90*  CO2  --  38* 33* 37* 36*  GLUCOSE  --  94 82 100* 93  BUN  --  CREATININE  --  0.70 0.78 0.81 0.91  CALCIUM  --  8.4* 8.5* 8.7* 8.6*  MG 1.7  --   --   --   --   PHOS 2.6  --   --   --   --    Liver Function Tests: No results for input(s): AST, ALT, ALKPHOS, BILITOT, PROT, ALBUMIN in the last 168 hours. No results for input(s): LIPASE, AMYLASE in the last 168 hours. No results for input(s): AMMONIA in the last 168 hours. CBC:  Recent Labs Lab 03/14/17 0755 03/15/17 0937 03/16/17 0323 03/17/17 0747  WBC 5.7 6.5 6.5 8.2  HGB 8.4* 9.2* 9.1* 9.6*  HCT 27.3* 29.0* 29.6* 30.5*  MCV 91.0 91.2 91.4 91.6  PLT 300 373 386 356   Cardiac Enzymes: No results for  input(s): CKTOTAL, CKMB, CKMBINDEX, TROPONINI in the last 168 hours. BNP: BNP (last 3 results)  Recent Labs  11/13/16 1435 11/24/16 1110 01/19/17 1114  BNP 142.6* 247.1* 489.6*    ProBNP (last 3 results) No results for input(s): PROBNP in the last 8760  hours.  CBG:  Recent Labs Lab 03/13/17 1707 03/16/17 1640  GLUCAP 95 152*       SignedZannie Cove MD.  [facility] 03/17/2017, 2:32 PM

## 2017-03-17 NOTE — Progress Notes (Signed)
Patient discharged to [facility] with all his belongings.  [facility] here to pick up.  No complaint voiced at the time of discharge. Elnita Maxwell, RN

## 2017-03-17 NOTE — Progress Notes (Signed)
Pt's dressing changed almost all over his body, BL upper and lower extremities, sacral region, perineal region,and cover up the blisters all over the body with Xerofoam, it took more than a hour with 3 nurses at least, bath is given, new bed sheet changed, gown changed and pt is in comfortable position now, will continue to monitor the patient.  Lonia Farber, RN

## 2017-03-17 NOTE — Progress Notes (Signed)
Nurse called [facility] to give report without success.  Nurse called [facility] the second time and left message and phone number with the receptionist for the receiving nurse to call this nurse for report. Elnita Maxwell, RN

## 2017-03-18 ENCOUNTER — Non-Acute Institutional Stay (SKILLED_NURSING_FACILITY): Payer: Medicare HMO | Admitting: Adult Health

## 2017-03-18 ENCOUNTER — Encounter: Payer: Self-pay | Admitting: Adult Health

## 2017-03-18 DIAGNOSIS — K7460 Unspecified cirrhosis of liver: Secondary | ICD-10-CM | POA: Diagnosis not present

## 2017-03-18 DIAGNOSIS — J9611 Chronic respiratory failure with hypoxia: Secondary | ICD-10-CM | POA: Diagnosis not present

## 2017-03-18 DIAGNOSIS — E785 Hyperlipidemia, unspecified: Secondary | ICD-10-CM

## 2017-03-18 DIAGNOSIS — M24561 Contracture, right knee: Secondary | ICD-10-CM | POA: Diagnosis not present

## 2017-03-18 DIAGNOSIS — I5032 Chronic diastolic (congestive) heart failure: Secondary | ICD-10-CM | POA: Diagnosis not present

## 2017-03-18 DIAGNOSIS — F0391 Unspecified dementia with behavioral disturbance: Secondary | ICD-10-CM

## 2017-03-18 DIAGNOSIS — G4733 Obstructive sleep apnea (adult) (pediatric): Secondary | ICD-10-CM

## 2017-03-18 DIAGNOSIS — R531 Weakness: Secondary | ICD-10-CM | POA: Diagnosis not present

## 2017-03-18 DIAGNOSIS — D638 Anemia in other chronic diseases classified elsewhere: Secondary | ICD-10-CM

## 2017-03-18 DIAGNOSIS — L89154 Pressure ulcer of sacral region, stage 4: Secondary | ICD-10-CM

## 2017-03-18 DIAGNOSIS — N390 Urinary tract infection, site not specified: Secondary | ICD-10-CM

## 2017-03-18 DIAGNOSIS — K219 Gastro-esophageal reflux disease without esophagitis: Secondary | ICD-10-CM

## 2017-03-18 DIAGNOSIS — I4891 Unspecified atrial fibrillation: Secondary | ICD-10-CM

## 2017-03-18 DIAGNOSIS — G894 Chronic pain syndrome: Secondary | ICD-10-CM

## 2017-03-18 DIAGNOSIS — R238 Other skin changes: Secondary | ICD-10-CM

## 2017-03-18 NOTE — Progress Notes (Addendum)
DATE:  03/18/2017   MRN:  [id number]  BIRTHDAY: 1946-06-09  Facility:  Nursing Home Location:  [facility]  Nursing Home Room Number: [id number]  LEVEL OF CARE:  SNF (31)  Contact Information    Name Relation Home Work Mobile   [facility] Daughter   [phone]   Eagles,[name] Son   [phone]   [facility] Daughter   [phone]   Mollie Germany Sister   [phone]       Code Status History    Date Active Date Inactive Code Status Order ID Comments User Context   02/24/2017  7:07 PM 03/17/2017  7:28 PM DNR [id number]  Michael Litter, MD ED   01/08/2017  2:26 PM 01/24/2017  2:26 AM DNR [id number]  Canary Brim, NP Inpatient   12/30/2016  5:48 AM 01/08/2017  2:26 PM Full Code [id number]  Hillary Bow, DO ED   11/24/2016  1:24 PM 11/29/2016  3:07 PM Full Code [id number]  Gwenyth Bender, NP ED   11/13/2016 10:41 PM 11/17/2016  9:47 PM Full Code [id number]  Eduard Clos, MD Inpatient   07/09/2015 12:40 AM 07/18/2015  5:56 PM Full Code [id number]  Eduard Clos, MD Inpatient   01/04/2015 10:39 PM 01/12/2015  7:37 PM Full Code [id number]  Inez Catalina, MD Inpatient    Questions for Most Recent Historical Code Status (Order [id number])    Question Answer Comment   In the event of cardiac or respiratory ARREST Do not call a "code blue"    In the event of cardiac or respiratory ARREST Do not perform Intubation, CPR, defibrillation or ACLS    In the event of cardiac or respiratory ARREST Use medication by any route, position, wound care, and other measures to relive pain and suffering. May use oxygen, suction and manual treatment of airway obstruction as needed for comfort.         Advance Directive Documentation     Most Recent Value  Type of Advance Directive  Out of facility DNR (pink MOST or yellow form)  Pre-existing out of facility DNR order (yellow form or pink MOST form)  -  "MOST" Form in Place?  -       Chief Complaint  Patient presents with  .  Hospitalization Follow-up    HISTORY OF PRESENT ILLNESS:  This is a 70-YO male seen for hospital follow-up.  He was readmitted to [facility] on 03/17/2017 following an admission at [facility] 02/24/2017-03/17/2017 for altered mental status and probable sepsis, and was found to have acute hypoxic and hypercarbic respiratory failure and CHF exacerbation.   He was diuresed and placed on BiPAP. He was negative 15.3L .He was followed by palliative medicine with poor prognosis. He was treated, as well, for UTI with Ceftriaxone then changed to [facility] since culture grew Providencia. He was transfused 2 units PRBC on 3/22. He has multiple sacral wounds for which he received wound treatments. He has PMH of atrial fibrillation on Eliquis, CKD 3, chronic diastolic heart failure, hypertension, chronic anemia, chronic pain, chronic venous insufficiency, chronic edema, cirrhosis of the liver based CT, incidental lesions on his liver and pancreas seen on prior CT imaging.   PAST MEDICAL HISTORY:  Past Medical History:  Diagnosis Date  . Acute encephalopathy   . Acute on chronic renal failure (HCC)   . Acute pulmonary edema (HCC)   . Acute respiratory failure with hypoxia and hypercapnia (HCC) 02/2017  . Arthritis    "normal  for my age" (11/13/2016)  . Atrial fibrillation (HCC)   . Cellulitis and abscess of leg 11/2016  . Chronic anemia   . Chronic atrial fibrillation (HCC)   . Chronic diastolic CHF (congestive heart failure) (HCC) 12/2002   a. EF 50-55% by echo 04/2012  . Decubitus ulcer of buttock, unstageable (HCC) 12/31/2016  . Edema of lower extremity   . Goals of care, counseling/discussion   . Gout   . H/O: GI bleed    "cause I took too much aspirin"  . High cholesterol   . Hyperglycemia    Noted 05/2012  . Hypertension   . Hypoxemia   . Morbid obesity (HCC)   . OSA on CPAP   . Palliative care encounter   . Peripheral edema   . PVD (peripheral vascular disease) (HCC)   . Sepsis  secondary to UTI (HCC)   . Venous stasis ulcer (HCC)      CURRENT MEDICATIONS: Reviewed  Patient's Medications  New Prescriptions   No medications on file  Previous Medications   ACETAMINOPHEN (TYLENOL) 500 MG TABLET    Take 500 mg by mouth every 8 (eight) hours as needed for moderate pain (Pain score 4-6/10).   AMINO ACIDS-PROTEIN HYDROLYS (FEEDING SUPPLEMENT, PRO-STAT SUGAR FREE 64,) LIQD    Take 30 mLs by mouth every 6 (six) hours.   APIXABAN (ELIQUIS) 5 MG TABS TABLET    Take 1 tablet (5 mg total) by mouth 2 (two) times daily.   ATORVASTATIN (LIPITOR) 80 MG TABLET    Take 1 tablet (80 mg total) by mouth daily.   CARVEDILOL (COREG) 12.5 MG TABLET    Take 12.5 mg by mouth 2 (two) times daily with a meal.   CEFPODOXIME (VANTIN) 200 MG TABLET    Take 1 tablet (200 mg total) by mouth every 12 (twelve) hours. For 2days   DIVALPROEX (DEPAKOTE ER) 250 MG 24 HR TABLET    Take 250 mg by mouth every morning.    DIVALPROEX (DEPAKOTE SPRINKLE) 125 MG CAPSULE    Take 125 mg by mouth at bedtime.   FAMOTIDINE (PEPCID) 20 MG TABLET    Take 20 mg by mouth 2 (two) times daily.   FUROSEMIDE (LASIX) 40 MG TABLET    Take 1 tablet (40 mg total) by mouth 2 (two) times daily.   HYDROCODONE-ACETAMINOPHEN (NORCO/VICODIN) 5-325 MG TABLET    Take 1-2 tablets by mouth every 6 (six) hours as needed for moderate pain.   LACTULOSE (CHRONULAC) 10 GM/15ML SOLUTION    Take 20 g by mouth every other day. Give 30 mL    MEMANTINE (NAMENDA) 5 MG TABLET    Take 5 mg by mouth 2 (two) times daily.   MULTIPLE VITAMIN (MULTIVITAMIN WITH MINERALS) TABS TABLET    Take 1 tablet by mouth daily.   PANTOPRAZOLE (PROTONIX) 40 MG TABLET    Take 1 tablet (40 mg total) by mouth at bedtime.   POTASSIUM CHLORIDE 20 MEQ TBCR    Take 40 mEq by mouth 2 (two) times daily.   SACCHAROMYCES BOULARDII (FLORASTOR) 250 MG CAPSULE    Take 250 mg by mouth 2 (two) times daily.    THIAMINE 100 MG TABLET    Take 1 tablet (100 mg total) by mouth daily.    VITAMINS A & D (VITAMIN A & D) OINTMENT    Apply 1 application topically 2 (two) times daily.  Modified Medications   No medications on file  Discontinued Medications   No medications on file  Allergies  Allergen Reactions  . Amlodipine Besylate Swelling and Other (See Comments)    "started off low and ended up severe; if, in fact, that is what's causing the swelling" (06/03/12)  . Levaquin [Levofloxacin In D5w] Rash     REVIEW OF SYSTEMS:  GENERAL:  no fever, chills EYES: Denies change in vision, dry eyes, eye pain, itching or discharge EARS: Denies change in hearing, ringing in ears, or earache NOSE: Denies nasal congestion or epistaxis MOUTH and THROAT: Denies oral discomfort, gingival pain or bleeding, pain from teeth or hoarseness   RESPIRATORY: no cough, SOB, DOE, wheezing, hemoptysis CARDIAC: no chest pain, edema or palpitations GI: no abdominal pain, diarrhea, constipation, heart burn, nausea or vomiting GU: Denies dysuria, frequency, hematuria PSYCHIATRIC: Denies feeling of depression or anxiety. No report of hallucinations, insomnia, paranoia, or agitation     PHYSICAL EXAMINATION  GENERAL APPEARANCE:  In no acute distress. Obese SKIN:  Has multiple sacral pressure ulcers, BUE blisters, BLE blisters, abdomen with blisters, right heel has unstageable ulcer HEAD: Normal in size and contour. No evidence of trauma EYES: Lids open and close normally. No blepharitis, entropion or ectropion. PERRL. Conjunctivae are clear and sclerae are white. Lenses are without opacity EARS: Pinnae are normal. Patient hears normal voice tunes of the examiner MOUTH and THROAT: Lips are without lesions. Oral mucosa is moist and without lesions. Tongue is normal in shape, size, and color and without lesions NECK: supple, trachea midline, no neck masses, no thyroid tenderness, no thyromegaly LYMPHATICS: no LAN in the neck, no supraclavicular LAN RESPIRATORY: breathing is even & unlabored,  BS CTAB CARDIAC: RRR, no murmur,no extra heart sounds, no edema GI: abdomen soft, normal BS, no masses, no tenderness, no hepatomegaly, no splenomegaly  GU:  Has foley catheter draining to urine bag EXTREMITIES:  Able to move X 4 extremities but has generalized weakness, left knee has contraction PSYCHIATRIC: Alert to self, disoriented to time and place. Affect and behavior are appropriate    LABS/RADIOLOGY: Labs reviewed: Basic Metabolic Panel:  Recent Labs  16/10/96 0530 03/10/17 0538 03/11/17 0312  03/15/17 0937 03/16/17 0323 03/17/17 0747  NA 137  --   --   < > 134* 135 135  K 3.5  --   --   < > 4.1 3.5 3.3*  CL 92*  --   --   < > 88* 87* 90*  CO2 35*  --   --   < > 33* 37* 36*  GLUCOSE 77  --   --   < > 82 100* 93  BUN 11  --   --   < > CREATININE 0.81  --   --   < > 0.78 0.81 0.91  CALCIUM 8.5*  --   --   < > 8.5* 8.7* 8.6*  MG 1.8 1.7 1.7  --   --   --   --   PHOS 3.5 3.3 2.6  --   --   --   --   < > = values in this interval not displayed. Liver Function Tests:  Recent Labs  02/26/17 0334 02/27/17 0447 02/28/17 0410  AST 14* 17 15  ALT 12* 12* 11*  ALKPHOS 132* 139* 126  BILITOT 1.3* 1.2 1.0  PROT 5.1* 5.6* 5.4*  ALBUMIN 1.7* 1.7* 1.6*    Recent Labs  12/29/16 1916  LIPASE 27    Recent Labs  02/27/17 1447 03/03/17 1800 03/07/17 1400  AMMONIA 51* 27 20  CBC:  Recent Labs  12/29/16 1916  02/17/17 1803 02/24/17 1421  03/15/17 0937 03/16/17 0323 03/17/17 0747  WBC 13.8*  < > 8.4 8.4  < > 6.5 6.5 8.2  NEUTROABS 11.6*  --  6.2 6.1  --   --   --   --   HGB 11.7*  < > 6.9* 8.0*  < > 9.2* 9.1* 9.6*  HCT 35.8*  < > 21.8* 26.2*  < > 29.0* 29.6* 30.5*  MCV 85.6  < > 93.2 93.2  < > 91.2 91.4 91.6  PLT 332  < > 303 344  < > 373 386 356  < > = values in this interval not displayed. Cardiac Enzymes:  Recent Labs  12/30/16 0618  CKTOTAL 29*   CBG:  Recent Labs  03/02/17 1649 03/13/17 1707 03/16/17 1640  GLUCAP 129* 95 152*        Dg Chest 1 View  Result Date: 03/12/2017 CLINICAL DATA:  Fever EXAM: CHEST 1 VIEW COMPARISON:  CT and chest radiography 1 week ago. FINDINGS: Chronic cardiomegaly. Chronic aortic atherosclerosis. Right arm PICC tip in the SVC at the azygos level. Left chest is clear except for minimal atelectasis in the lower lobe. Right chest shows hazy density with volume loss in the right lower lobe, probably related to a right effusion with right lower lobe volume loss. Appearance is improved compared to the previous studies. No worsening or new findings. Old rib fractures on the right. IMPRESSION: Radiographic improvement. Some persistent volume loss in the lower lobes, right more than left. Electronically Signed   By: Paulina Fusi M.D.   On: 03/12/2017 11:33   Ct Chest Wo Contrast  Result Date: 03/07/2017 CLINICAL DATA:  Acute on chronic respiratory failure. Hypoxia and hypercapnia. History of CHF, atrial fibrillation, pulmonary vascular congestion, sepsis. EXAM: CT CHEST WITHOUT CONTRAST TECHNIQUE: Multidetector CT imaging of the chest was performed following the standard protocol without IV contrast. COMPARISON:  Chest radiograph March 07, 2017 at 1523 hours FINDINGS: Cardiovascular: The heart is moderately enlarged. No pericardial effusions. Severe coronary artery calcifications. Ascending aorta is 4.3 by 4.7 cm in transaxial dimension with moderate calcific atherosclerosis. RIGHT PICC distal tip in the upper superior vena cava. Mediastinum/Nodes: No lymphadenopathy by CT size criteria though sensitivity decreased without intravenous contrast. Lungs/Pleura: Small to moderate RIGHT and small LEFT layering pleural effusions with underlying consolidation. Scattered calcified granulomata. Patchy to RIGHT middle lobe, RIGHT lower lobe consolidation with air bronchograms. Heterogeneous lung attenuation. No pulmonary masses, limited assessment for pulmonary nodule due to respiratory motion and habitus. Upper  Abdomen: Nonacute. Streak artifact through upper abdomen, patient was scanned with arms at side. 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 Old RIGHT rib fractures. Moderate degenerative change of thoracic spine. IMPRESSION: Small to moderate RIGHT and small LEFT pleural effusions. RIGHT middle and RIGHT lower lobe suspected pneumonia. Cardiomegaly.  Findings of small airway disease. **An incidental finding of potential clinical significance has been found. 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 Ascending thoracic aortic aneurysm. Recommend semi-annual imaging followup by CTA or MRA and referral to cardiothoracic surgery if not already obtained. This recommendation follows 2010 ACCF/AHA/AATS/ACR/ASA/SCA/SCAI/SIR/STS/SVM Guidelines for the Diagnosis and Management of Patients With Thoracic Aortic Disease. Circulation. 2010; 121: W119-J478** Electronically Signed   By: Awilda Metro M.D.   On: 03/07/2017 22:01   Dg Chest Port 1 View  Result Date: 03/07/2017 CLINICAL DATA:  Respiratory failure, history CHF, hypertension, chronic atrial fibrillation, morbid obesity EXAM: PORTABLE CHEST 1 VIEW  COMPARISON:  Portable exam 1523 hours compared to 02/26/2017 FINDINGS: RIGHT arm PICC line tip projects over SVC. Enlargement of cardiac silhouette with pulmonary vascular congestion. Increased pulmonary infiltrates particularly RIGHT with minimal RIGHT pleural effusion, likely representing pulmonary edema and CHF. No pneumothorax. Visualized osseous structures unremarkable. IMPRESSION: Enlargement of cardiac silhouette with pulmonary vascular congestion and probable mild pulmonary edema/CHF. Electronically Signed   By: Ulyses Southward M.D.   On: 03/07/2017 15:40   Dg Chest Port 1 View  Result Date: 02/26/2017 CLINICAL DATA:  PICC line. EXAM: PORTABLE CHEST 1 VIEW COMPARISON:  02/24/2017 . FINDINGS: PICC line noted with tip over the cavoatrial junction. Cardiomegaly with pulmonary  interstitial prominence. Small right pleural effusion. No pneumothorax . IMPRESSION: 1. PICC line noted with tip projected over the cavoatrial junction. 2. Congestive heart failure with pulmonary interstitial edema and small right pleural effusion . Electronically Signed   By: Maisie Fus  Register   On: 02/26/2017 11:37   Dg Chest Port 1 View  Result Date: 02/24/2017 CLINICAL DATA:  Altered mental status EXAM: PORTABLE CHEST 1 VIEW COMPARISON:  January 20, 2017 FINDINGS: There is persistent cardiomegaly. There is pulmonary venous hypertension. There is no edema or consolidation. No adenopathy. No bone lesions. IMPRESSION: Pulmonary vascular congestion without edema or consolidation. Electronically Signed   By: Bretta Bang III M.D.   On: 02/24/2017 15:08    ASSESSMENT/PLAN:  Generalized weakness - for rehabilitation, PT and OT, for therapeutic and strengthening exercises; fall precautions  Chronic respiratory failure with hypoxia - PCCM was consulted and recommended BiPAP daily at bedtime and when necessary during the day; he was diuresed and was negative 15.3L; continue Lasix 40 mg 1 tab by mouth twice a day; palliative consult with [facility]; physiatry consult   Chronic diastolic CHF - was diuresed 15.3L; will continue Lasix 40 mg 1 tab by mouth twice a day, check BMP  Atrial fibrillation - rate controlled; continue Eliquis 5 mg 1 tab by mouth twice a day, Coreg 12.5 mg 1 tab by mouth twice a day  Dementia with behavioral disturbance  - continue Namenda 5 mg 1 tab by mouth twice a day, Depakote DR 250 mg 1 tab by mouth Q AM and  125 mg Q HS  Urinary tract infection - continue cefpodoxime 200 mg 1 tab by mouth every 12 hours till 03/19/17  Right knee contracture - for rehabilitation with PT and OT for therapeutic strengthening exercises  Hyperlipidemia - continue Lipitor 80 mg 1 tab by mouth daily  Liver cirrhosis - continue lactulose 10 g/15 mL solution give 30 mL by mouth every other  day  GERD - continue Protonix 40 mg 1 tab by mouth daily at bedtime and Pepcid 20 mg 1 tab by mouth twice a day  Sacral pressure ulcer, stage IV - continue wound treatment daily, air mattress, turn to sides and keep skin clean and dry  Obstructive sleep apnea - continue BiPAP at at bedtime   Chronic pain - continue Norco 5/325 mg 1-2 tabs by mouth every 6 hours when necessary and Tylenol extra strength 500 mg 1 tab by mouth every 8 hours when necessary  Multiple blisters - continue wound treatment, keep skin clean and dry, monitor for infection  Anemia of chronic disease - hgb 9.6, S/P transfusion of 2 units packed RBC; check CBC      Goals of care:  Short-term rehabilitation     Sakura Denis C. Medina-Vargas - NP    [employer] [phone]

## 2017-03-19 ENCOUNTER — Non-Acute Institutional Stay (SKILLED_NURSING_FACILITY): Payer: Medicare HMO | Admitting: Internal Medicine

## 2017-03-19 ENCOUNTER — Encounter: Payer: Self-pay | Admitting: Internal Medicine

## 2017-03-19 DIAGNOSIS — R195 Other fecal abnormalities: Secondary | ICD-10-CM | POA: Diagnosis not present

## 2017-03-19 DIAGNOSIS — G4733 Obstructive sleep apnea (adult) (pediatric): Secondary | ICD-10-CM

## 2017-03-19 DIAGNOSIS — I5032 Chronic diastolic (congestive) heart failure: Secondary | ICD-10-CM | POA: Diagnosis not present

## 2017-03-19 DIAGNOSIS — L89154 Pressure ulcer of sacral region, stage 4: Secondary | ICD-10-CM

## 2017-03-19 DIAGNOSIS — L89610 Pressure ulcer of right heel, unstageable: Secondary | ICD-10-CM | POA: Diagnosis not present

## 2017-03-19 DIAGNOSIS — M24561 Contracture, right knee: Secondary | ICD-10-CM | POA: Diagnosis not present

## 2017-03-19 DIAGNOSIS — G8929 Other chronic pain: Secondary | ICD-10-CM

## 2017-03-19 DIAGNOSIS — I4891 Unspecified atrial fibrillation: Secondary | ICD-10-CM

## 2017-03-19 DIAGNOSIS — R5381 Other malaise: Secondary | ICD-10-CM

## 2017-03-19 DIAGNOSIS — R238 Other skin changes: Secondary | ICD-10-CM | POA: Diagnosis not present

## 2017-03-19 DIAGNOSIS — N3000 Acute cystitis without hematuria: Secondary | ICD-10-CM

## 2017-03-19 NOTE — Progress Notes (Signed)
LOCATION: [facility]  PCP: Miki Kins   Code Status: DNR  Goals of care: Advanced Directive information Advanced Directives 03/19/2017  Does Patient Have a Medical Advance Directive? Yes  Type of Advance Directive Out of facility DNR (pink MOST or yellow form)  Does patient want to make changes to medical advance directive? No - Patient declined  Copy of Healthcare Power of Attorney in Chart? -  Would patient like information on creating a medical advance directive? -  Pre-existing out of facility DNR order (yellow form or pink MOST form) -       Extended Emergency Contact Information Primary Emergency Contact: [facility] Address: [address]           [address]          [place], [place] [address] [place] Phone: [phone] Relation: Daughter Secondary Emergency Contact: Garro,[name]  [place] Mobile Phone: [phone] Relation: Son   Allergies  Allergen Reactions  . Amlodipine Besylate Swelling and Other (See Comments)    "started off low and ended up severe; if, in fact, that is what's causing the swelling" (06/03/12)  . Levaquin [Levofloxacin In D5w] Rash    Chief Complaint  Patient presents with  . Readmit To SNF    Readmission Visit      HPI:  Patient is a 71 y.o. male seen today for short term rehabilitation post hospital admission from 02/24/17-03/17/17 with acute respiratory failure from CHF exacerbation and acute encephalopathy. He received diuresis and BiPAP and was seen by palliative care. Seizure was ruled out. Ammonia level was normal. He received antibiotic for providencia UTI. He received wound care for ulcer to his legs from venous insufficiency and sacral wound. He has PMH of Afib, chronic diastolic CHF, generalized anasarca, CKD stage 3, HTN, chronic pain, liver cirrhosis among others. He is seen in his room today.   Review of Systems:  Constitutional: Negative for fever, chills,  diaphoresis.  HENT: Negative for headache, congestion, difficulty swallowing.   Eyes: Negative for double vision and discharge.  Respiratory: Negative for cough, shortness of breath.   Cardiovascular: Negative for chest pain, palpitations Gastrointestinal: Negative for heartburn, nausea, vomiting, abdominal pain. He has loose stool today. Positive for poor appetite.  Genitourinary: has foley catheter Musculoskeletal: Negative for back pain, fall in the facility.  Skin: Negative for itching, rash.  Neurological: Negative for dizziness. Psychiatric/Behavioral: Negative for depression.   Past Medical History:  Diagnosis Date  . Acute encephalopathy   . Acute on chronic renal failure (HCC)   . Acute pulmonary edema (HCC)   . Acute respiratory failure with hypoxia and hypercapnia (HCC) 02/2017  . Arthritis    "normal for my age" (11/13/2016)  . Atrial fibrillation (HCC)   . Cellulitis and abscess of leg 11/2016  . Chronic anemia   . Chronic atrial fibrillation (HCC)   . Chronic diastolic CHF (congestive heart failure) (HCC) 12/2002   a. EF 50-55% by echo 04/2012  . Decubitus ulcer of buttock, unstageable (HCC) 12/31/2016  . Edema of lower extremity   . Goals of care, counseling/discussion   . Gout   . H/O: GI bleed    "cause I took too much aspirin"  . High cholesterol   . Hyperglycemia    Noted 05/2012  . Hypertension   . Hypoxemia   . Morbid obesity (HCC)   . OSA on CPAP   . Palliative care encounter   . Peripheral edema   .  PVD (peripheral vascular disease) (HCC)   . Sepsis secondary to UTI (HCC)   . Venous stasis ulcer (HCC)    Past Surgical History:  Procedure Laterality Date  . DEBRIDMENT OF DECUBITUS ULCER N/A 01/02/2017   Procedure: DEBRIDMENT OF DECUBITUS ULCER;  Surgeon: Axel Filler, MD;  Location: [facility] OR;  Service: General;  Laterality: N/A;  . NO PAST SURGERIES     Social History:   reports that he quit smoking about 51 years ago. His smoking use included  Cigarettes. He has never used smokeless tobacco. He reports that he drinks about 8.4 oz of alcohol per week . He reports that he does not use drugs.  Family History  Problem Relation Age of Onset  . Arrhythmia Father   . Hypertension Father   . Cancer Mother     cervical    Medications: Allergies as of 03/19/2017      Reactions   Amlodipine Besylate Swelling, Other (See Comments)   "started off low and ended up severe; if, in fact, that is what's causing the swelling" (06/03/12)   Levaquin [levofloxacin In D5w] Rash      Medication List       Accurate as of 03/19/17 12:03 PM. Always use your most recent med list.          acetaminophen 500 MG tablet Commonly known as:  TYLENOL Take 500 mg by mouth every 8 (eight) hours as needed for moderate pain (Pain score 4-6/10).   apixaban 5 MG Tabs tablet Commonly known as:  ELIQUIS Take 1 tablet (5 mg total) by mouth 2 (two) times daily.   atorvastatin 80 MG tablet Commonly known as:  LIPITOR Take 1 tablet (80 mg total) by mouth daily.   carvedilol 12.5 MG tablet Commonly known as:  COREG Take 12.5 mg by mouth 2 (two) times daily with a meal.   cefpodoxime 200 MG tablet Commonly known as:  VANTIN Take 1 tablet (200 mg total) by mouth every 12 (twelve) hours. For 2days   divalproex 125 MG capsule Commonly known as:  DEPAKOTE SPRINKLE Take 125 mg by mouth at bedtime.   divalproex 250 MG 24 hr tablet Commonly known as:  DEPAKOTE ER Take 250 mg by mouth every morning.   famotidine 20 MG tablet Commonly known as:  PEPCID Take 20 mg by mouth 2 (two) times daily.   feeding supplement (PRO-STAT SUGAR FREE 64) Liqd Take 30 mLs by mouth every 6 (six) hours.   furosemide 40 MG tablet Commonly known as:  LASIX Take 1 tablet (40 mg total) by mouth 2 (two) times daily.   HYDROcodone-acetaminophen 5-325 MG tablet Commonly known as:  NORCO/VICODIN Take 1-2 tablets by mouth every 6 (six) hours as needed for moderate pain.     lactulose 10 GM/15ML solution Commonly known as:  CHRONULAC Take 20 g by mouth every other day. Give 30 mL   memantine 5 MG tablet Commonly known as:  NAMENDA Take 5 mg by mouth 2 (two) times daily.   multivitamin with minerals Tabs tablet Take 1 tablet by mouth daily.   pantoprazole 40 MG tablet Commonly known as:  PROTONIX Take 1 tablet (40 mg total) by mouth at bedtime.   potassium chloride SA 20 MEQ tablet Commonly known as:  K-DUR,KLOR-CON Take 20 mEq by mouth 2 (two) times daily.   saccharomyces boulardii 250 MG capsule Commonly known as:  FLORASTOR Take 250 mg by mouth 2 (two) times daily.   thiamine 100 MG tablet Take 1 tablet (  100 mg total) by mouth daily.   vitamin A & D ointment Apply 1 application topically 2 (two) times daily.       Immunizations:  There is no immunization history on file for this patient.   Physical Exam:  Vitals:   03/19/17 1158  BP: 105/70  Pulse: 78  Resp: 20  Temp: 98.9 F (37.2 C)  TempSrc: Oral  SpO2: 94%  Weight: 249 lb (112.9 kg)  Height:  (1.778 m)   Body mass index is 35.73 kg/m.  General- elderly male, obese, in no acute distress Head- normocephalic, atraumatic Nose- no nasal discharge Throat- moist mucus membrane Eyes- PERRLA, EOMI, no pallor, no icterus, no discharge, normal conjunctiva, normal sclera Neck- no cervical lymphadenopathy Cardiovascular- normal s1,s2, no murmur Respiratory- bilateral clear to auscultation, no wheeze, no rhonchi, no crackles, no use of accessory muscles Abdomen- bowel sounds present, soft, non tender, no guarding or rigidity, foley catheter present Musculoskeletal- able to move all 4 extremities, generalized weakness mainly to lower extremities, 1+ edema, Contracture to his right knee Neurological- alert and oriented to person, place and time Skin- warm and dry, stage 4 pressure ulcer to sacrum, multiple skin tears, clear fluid filled blisters upto 4 cm in size on arms,  legs and trunk area with few that have opened. No signs of infection noted, chronic skin changes to his lower legs, unstageable pressure ulcer to right heel Psychiatry- normal mood and affect    Labs reviewed: Basic Metabolic Panel:  Recent Labs  40/98/11 0530 03/10/17 0538 03/11/17 0312  03/15/17 0937 03/16/17 0323 03/17/17 0747  NA 137  --   --   < > 134* 135 135  K 3.5  --   --   < > 4.1 3.5 3.3*  CL 92*  --   --   < > 88* 87* 90*  CO2 35*  --   --   < > 33* 37* 36*  GLUCOSE 77  --   --   < > 82 100* 93  BUN 11  --   --   < > CREATININE 0.81  --   --   < > 0.78 0.81 0.91  CALCIUM 8.5*  --   --   < > 8.5* 8.7* 8.6*  MG 1.8 1.7 1.7  --   --   --   --   PHOS 3.5 3.3 2.6  --   --   --   --   < > = values in this interval not displayed. Liver Function Tests:  Recent Labs  02/26/17 0334 02/27/17 0447 02/28/17 0410  AST 14* 17 15  ALT 12* 12* 11*  ALKPHOS 132* 139* 126  BILITOT 1.3* 1.2 1.0  PROT 5.1* 5.6* 5.4*  ALBUMIN 1.7* 1.7* 1.6*    Recent Labs  12/29/16 1916  LIPASE 27    Recent Labs  02/27/17 1447 03/03/17 1800 03/07/17 1400  AMMONIA 51* 27 20   CBC:  Recent Labs  12/29/16 1916  02/17/17 1803 02/24/17 1421  03/15/17 0937 03/16/17 0323 03/17/17 0747  WBC 13.8*  < > 8.4 8.4  < > 6.5 6.5 8.2  NEUTROABS 11.6*  --  6.2 6.1  --   --   --   --   HGB 11.7*  < > 6.9* 8.0*  < > 9.2* 9.1* 9.6*  HCT 35.8*  < > 21.8* 26.2*  < > 29.0* 29.6* 30.5*  MCV 85.6  < > 93.2 93.2  < >  91.2 91.4 91.6  PLT 332  < > 303 344  < > 373 386 356  < > = values in this interval not displayed. Cardiac Enzymes:  Recent Labs  12/30/16 0618  CKTOTAL 29*   BNP: Invalid input(s): POCBNP CBG:  Recent Labs  03/02/17 1649 03/13/17 1707 03/16/17 1640  GLUCAP 129* 95 152*    Radiological Exams: Dg Chest 1 View  Result Date: 03/12/2017 CLINICAL DATA:  Fever EXAM: CHEST 1 VIEW COMPARISON:  CT and chest radiography 1 week ago. FINDINGS: Chronic cardiomegaly.  Chronic aortic atherosclerosis. Right arm PICC tip in the SVC at the azygos level. Left chest is clear except for minimal atelectasis in the lower lobe. Right chest shows hazy density with volume loss in the right lower lobe, probably related to a right effusion with right lower lobe volume loss. Appearance is improved compared to the previous studies. No worsening or new findings. Old rib fractures on the right. IMPRESSION: Radiographic improvement. Some persistent volume loss in the lower lobes, right more than left. Electronically Signed   By: Paulina Fusi M.D.   On: 03/12/2017 11:33   Ct Chest Wo Contrast  Result Date: 03/07/2017 CLINICAL DATA:  Acute on chronic respiratory failure. Hypoxia and hypercapnia. History of CHF, atrial fibrillation, pulmonary vascular congestion, sepsis. EXAM: CT CHEST WITHOUT CONTRAST TECHNIQUE: Multidetector CT imaging of the chest was performed following the standard protocol without IV contrast. COMPARISON:  Chest radiograph March 07, 2017 at 1523 hours FINDINGS: Cardiovascular: The heart is moderately enlarged. No pericardial effusions. Severe coronary artery calcifications. Ascending aorta is 4.3 by 4.7 cm in transaxial dimension with moderate calcific atherosclerosis. RIGHT PICC distal tip in the upper superior vena cava. Mediastinum/Nodes: No lymphadenopathy by CT size criteria though sensitivity decreased without intravenous contrast. Lungs/Pleura: Small to moderate RIGHT and small LEFT layering pleural effusions with underlying consolidation. Scattered calcified granulomata. Patchy to RIGHT middle lobe, RIGHT lower lobe consolidation with air bronchograms. Heterogeneous lung attenuation. No pulmonary masses, limited assessment for pulmonary nodule due to respiratory motion and habitus. Upper Abdomen: Nonacute. Streak artifact through upper abdomen, patient was scanned with arms at side. 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 Old RIGHT  rib fractures. Moderate degenerative change of thoracic spine. IMPRESSION: Small to moderate RIGHT and small LEFT pleural effusions. RIGHT middle and RIGHT lower lobe suspected pneumonia. Cardiomegaly.  Findings of small airway disease. **An incidental finding of potential clinical significance has been found. 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 Ascending thoracic aortic aneurysm. Recommend semi-annual imaging followup by CTA or MRA and referral to cardiothoracic surgery if not already obtained. This recommendation follows 2010 ACCF/AHA/AATS/ACR/ASA/SCA/SCAI/SIR/STS/SVM Guidelines for the Diagnosis and Management of Patients With Thoracic Aortic Disease. Circulation. 2010; 121: [id number]** Electronically Signed   By: Awilda Metro M.D.   On: 03/07/2017 22:01   Dg Chest Port 1 View  Result Date: 03/07/2017 CLINICAL DATA:  Respiratory failure, history CHF, hypertension, chronic atrial fibrillation, morbid obesity EXAM: PORTABLE CHEST 1 VIEW COMPARISON:  Portable exam 1523 hours compared to 02/26/2017 FINDINGS: RIGHT arm PICC line tip projects over SVC. Enlargement of cardiac silhouette with pulmonary vascular congestion. Increased pulmonary infiltrates particularly RIGHT with minimal RIGHT pleural effusion, likely representing pulmonary edema and CHF. No pneumothorax. Visualized osseous structures unremarkable. IMPRESSION: Enlargement of cardiac silhouette with pulmonary vascular congestion and probable mild pulmonary edema/CHF. Electronically Signed   By: Ulyses Southward M.D.   On: 03/07/2017 15:40   Dg Chest Port 1 View  Result Date: 02/26/2017 CLINICAL DATA:  PICC line. EXAM: PORTABLE CHEST 1 VIEW COMPARISON:  02/24/2017 . FINDINGS: PICC line noted with tip over the cavoatrial junction. Cardiomegaly with pulmonary interstitial prominence. Small right pleural effusion. No pneumothorax . IMPRESSION: 1. PICC line noted with tip projected over the cavoatrial junction. 2. Congestive heart failure with  pulmonary interstitial edema and small right pleural effusion . Electronically Signed   By: Maisie Fus  Register   On: 02/26/2017 11:37   Dg Chest Port 1 View  Result Date: 02/24/2017 CLINICAL DATA:  Altered mental status EXAM: PORTABLE CHEST 1 VIEW COMPARISON:  January 20, 2017 FINDINGS: There is persistent cardiomegaly. There is pulmonary venous hypertension. There is no edema or consolidation. No adenopathy. No bone lesions. IMPRESSION: Pulmonary vascular congestion without edema or consolidation. Electronically Signed   By: Bretta Bang III M.D.   On: 02/24/2017 15:08    Assessment/Plan  Physical deconditioning With generalized weakness. Has overall poor prognosis given her severe deconditioning, multiple ulcers, severe protein calorie malnutrition, development of contracture and his other medical comorbidities. Will have palliative care team to evaluate the patient for further goals of care discussion and to focus on comfort care. He does remain a high rehospitalization risk at present. Patient is currently DO NOT RESUSCITATE.  Multiple blisters Scattered on arms, legs and trunk area with few of them having opened with denuded skin. No signs of infection noted. Currently on an antibiotic and I have concerns of possible allergic reaction. Discontinue antibiotic for now. Place him on prednisone 50 mg by mouth daily for 1 week and monitor. Will keep the skin area clean and moist and monitor for signs of infection.  Loose stool Currently on antibiotic. Add probiotics and and monitor. He is also on lactulose every other day for now. Change his lactulose to every day as needed only and monitor. If continues to have loose stool, send stool for needed given his recent hospitalization and being on antibiotics.  Right knee contracture Will need to work with physical therapy and occupational therapy as tolerated. Consider soft brace/splint to help prevent worsening.   Unstageable pressure ulcer To  his legs. Continue to provide excellent care. Monitor nutritional intake.  Stage IV pressure ulcer pressure ulcer to her sacral area. Provide wound care. Air mattress to help promote wound healing. Will need frequent repositioning. Patient does not do her own chair for more than 1 hour at a stretch to help promote wound healing.  chronic diastolic congestive heart failure Currently on Lasix 40 mg twice a day with potassium chloride 20 mL valid twice a day, Coreg 12.5 mg twice a day. Monitor BMP and weight.  Obstructive sleep apnea To continue using CPAP machine at bedtime. Monitor his breathing status.  Atrial fibrillation Controlled heart rate this visit. Continue carvedilol 12.5 mg twice a day and eliquis 5 mg twice a day for stroke prophylaxis.  Providencia urinary tract infection Currently on cefpodoxime 200 mg twice a day. Discontinue this antibiotic given his skin findings with concerns for allergic reaction. Patient was supposed to complete his antibiotic course today. Continue Foley catheter and Foley care. Monitor clinically.  Chronic pain With multiple pressure ulcers. Currently on Norco 5-3 25 mg every 6 hours as needed for pain and extra strength Tylenol 500 mg every 8 hours as needed for pain. Will have PMR to follow.   Goals of care: short term rehabilitation, I see him be a long-term care resident given his needs of care    Labs/tests ordered: CBC, BMP   Family/ staff  Communication: reviewed care plan with patient and nursing supervisor  I have spent greater than 50 minutes for this encounter which includes reviewing hospital records, addressing above mentioned concerns, reviewing care plan with patient, answering patient's concerns and counseling her.     Oneal Grout, MD Internal Medicine [facility] [address] [place], [place] [address] Cell Phone (Monday-Friday 8 am - 5 pm): [phone] On Call: [phone] and follow  prompts after 5 pm and on weekends Office Phone: [phone] Office Fax: [phone]

## 2017-03-20 DIAGNOSIS — L89610 Pressure ulcer of right heel, unstageable: Secondary | ICD-10-CM | POA: Diagnosis not present

## 2017-03-20 DIAGNOSIS — L89890 Pressure ulcer of other site, unstageable: Secondary | ICD-10-CM | POA: Diagnosis not present

## 2017-03-20 DIAGNOSIS — L89154 Pressure ulcer of sacral region, stage 4: Secondary | ICD-10-CM | POA: Diagnosis not present

## 2017-03-20 DIAGNOSIS — R600 Localized edema: Secondary | ICD-10-CM | POA: Diagnosis not present

## 2017-03-20 DIAGNOSIS — M6281 Muscle weakness (generalized): Secondary | ICD-10-CM | POA: Diagnosis not present

## 2017-03-20 LAB — BASIC METABOLIC PANEL
BUN: 31 mg/dL — AB (ref 4–21)
CREATININE: 1.1 mg/dL (ref 0.6–1.3)
Glucose: 86 mg/dL
POTASSIUM: 4.1 mmol/L (ref 3.4–5.3)
SODIUM: 136 mmol/L — AB (ref 137–147)

## 2017-03-20 LAB — CBC AND DIFFERENTIAL
HEMATOCRIT: 26 % — AB (ref 41–53)
HEMOGLOBIN: 8.1 g/dL — AB (ref 13.5–17.5)
Neutrophils Absolute: 1 /uL
Platelets: 319 10*3/uL (ref 150–399)
WBC: 5.8 10*3/mL

## 2017-03-23 ENCOUNTER — Encounter: Payer: Self-pay | Admitting: Adult Health

## 2017-03-23 ENCOUNTER — Ambulatory Visit (INDEPENDENT_AMBULATORY_CARE_PROVIDER_SITE_OTHER): Payer: Medicare HMO | Admitting: Adult Health

## 2017-03-23 DIAGNOSIS — Z9989 Dependence on other enabling machines and devices: Secondary | ICD-10-CM | POA: Diagnosis not present

## 2017-03-23 DIAGNOSIS — J189 Pneumonia, unspecified organism: Secondary | ICD-10-CM | POA: Diagnosis not present

## 2017-03-23 DIAGNOSIS — G4733 Obstructive sleep apnea (adult) (pediatric): Secondary | ICD-10-CM

## 2017-03-23 DIAGNOSIS — L89150 Pressure ulcer of sacral region, unstageable: Secondary | ICD-10-CM

## 2017-03-23 NOTE — Assessment & Plan Note (Signed)
BIlateral PNA , improved with abx  Check cxr for clearance .  Will need PCXR at SNF as unable to stand for our xray dept.

## 2017-03-23 NOTE — Assessment & Plan Note (Signed)
Cont on CPAP At bedtime  

## 2017-03-23 NOTE — Assessment & Plan Note (Signed)
Cont follow up with SNF MD for wound care.

## 2017-03-23 NOTE — Progress Notes (Signed)
  ID: Jesus [name], male    DOB: 01/12/1946, 71 y.o.   MRN: [id number]  Chief Complaint  Patient presents with  . Follow-up    PNA /post hospital     Referring provider: Miki Kins  HPI: 71 year old male SNF seen for PCCM consult during hospitalization 02/2017 for acute hypoxic/hypercarbic RF with decompensated D CHF , PNA , pulmonary edema and sepsis .   03/23/2017 Follow up :  Post hospital Patient presents for a post hospital follow-up. Patient was recently admitted for prolonged hospitalization 3/20 to 03/17/17 for for acute hypoxic and hypercarbic respiratory failure with acute pulmonary edema, compensated by acute metabolic encephalopathy with underlying dementia. Patient had sepsis with a UTI./Cellulitis .  He had decompensated diastolic heart failure. He has multiple comorbidities with cirrhosis and severe hypoalbuminemia with third spacing. An extensive stage IV decubitus ulcers.  Pt has been brought in by EMS transport today on stretcher as he is bed bound at SNF.  He is being seen at SNF by Dr. Glade Lloyd. ? Allergic reaction with scattered blisters. Abx were stopped and started on prednisone x 1 week. (per chart review ) .  Remains on lasix  Twice daily  For diastolic CHF .  He is on CPAP At bedtime  .  Pt says his breathing is doing better , no dyspnea.  Main complaint is his skin is hurting and he has chronic pain with back and buttock .      Allergies  Allergen Reactions  . Amlodipine Besylate Swelling and Other (See Comments)    "started off low and ended up severe; if, in fact, that is what's causing the swelling" (06/03/12)  . Levaquin [Levofloxacin In D5w] Rash     There is no immunization history on file for this patient.  Past Medical History:  Diagnosis Date  . Acute encephalopathy   . Acute on chronic renal failure (HCC)   . Acute pulmonary edema (HCC)   . Acute respiratory failure with hypoxia and hypercapnia (HCC) 02/2017  .  Arthritis    "normal for my age" (11/13/2016)  . Atrial fibrillation (HCC)   . Cellulitis and abscess of leg 11/2016  . Chronic anemia   . Chronic atrial fibrillation (HCC)   . Chronic diastolic CHF (congestive heart failure) (HCC) 12/2002   a. EF 50-55% by echo 04/2012  . Decubitus ulcer of buttock, unstageable (HCC) 12/31/2016  . Edema of lower extremity   . Goals of care, counseling/discussion   . Gout   . H/O: GI bleed    "cause I took too much aspirin"  . High cholesterol   . Hyperglycemia    Noted 05/2012  . Hypertension   . Hypoxemia   . Morbid obesity (HCC)   . OSA on CPAP   . Palliative care encounter   . Peripheral edema   . PVD (peripheral vascular disease) (HCC)   . Sepsis secondary to UTI (HCC)   . Venous stasis ulcer (HCC)     Tobacco History: History  Smoking Status  . Former Smoker  . Types: Cigarettes  . Quit date: 12/08/1965  Smokeless Tobacco  . Never Used    Comment: "social cigarette smoker in my late teens"   Counseling given: Not Answered   Outpatient Encounter Prescriptions as of 03/23/2017  Medication Sig  . acetaminophen (TYLENOL) 500 MG tablet Take 500 mg by mouth every 8 (eight) hours as needed for moderate pain (Pain score 4-6/10).  . Amino Acids-Protein Hydrolys (FEEDING SUPPLEMENT, PRO-STAT  SUGAR FREE 64,) LIQD Take 30 mLs by mouth every 6 (six) hours.  Marland Kitchen apixaban (ELIQUIS) 5 MG TABS tablet Take 1 tablet (5 mg total) by mouth 2 (two) times daily.  Marland Kitchen atorvastatin (LIPITOR) 80 MG tablet Take 1 tablet (80 mg total) by mouth daily.  . carvedilol (COREG) 12.5 MG tablet Take 12.5 mg by mouth 2 (two) times daily with a meal.  . cefpodoxime (VANTIN) 200 MG tablet Take 1 tablet (200 mg total) by mouth every 12 (twelve) hours. For 2days  . divalproex (DEPAKOTE ER) 250 MG 24 hr tablet Take 250 mg by mouth every morning.   . divalproex (DEPAKOTE SPRINKLE) 125 MG capsule Take 125 mg by mouth at bedtime.  . famotidine (PEPCID) 20 MG tablet Take 20 mg by  mouth 2 (two) times daily.  . furosemide (LASIX) 40 MG tablet Take 1 tablet (40 mg total) by mouth 2 (two) times daily.  Marland Kitchen HYDROcodone-acetaminophen (NORCO/VICODIN) 5-325 MG tablet Take 1-2 tablets by mouth every 6 (six) hours as needed for moderate pain.  Marland Kitchen lactulose (CHRONULAC) 10 GM/15ML solution Take 20 g by mouth every other day. Give 30 mL   . memantine (NAMENDA) 5 MG tablet Take 5 mg by mouth 2 (two) times daily.  . Multiple Vitamin (MULTIVITAMIN WITH MINERALS) TABS tablet Take 1 tablet by mouth daily.  . pantoprazole (PROTONIX) 40 MG tablet Take 1 tablet (40 mg total) by mouth at bedtime.  . potassium chloride SA (K-DUR,KLOR-CON) 20 MEQ tablet Take 20 mEq by mouth 2 (two) times daily.  Marland Kitchen saccharomyces boulardii (FLORASTOR) 250 MG capsule Take 250 mg by mouth 2 (two) times daily.   Marland Kitchen thiamine 100 MG tablet Take 1 tablet (100 mg total) by mouth daily.  . Vitamins A & D (VITAMIN A & D) ointment Apply 1 application topically 2 (two) times daily.   No facility-administered encounter medications on file as of 03/23/2017.      Review of Systems  Constitutional:   No  weight loss, night sweats,  Fevers, chills, + fatigue, or  lassitude.  HEENT:   No headaches,  Difficulty swallowing,  Tooth/dental problems, or  Sore throat,                No sneezing, itching, ear ache, nasal congestion, post nasal drip,   CV:  No chest pain,  Orthopnea, PND, swelling in lower extremities, anasarca, dizziness, palpitations, syncope.   GI  No heartburn, indigestion, abdominal pain, nausea, vomiting, diarrhea, change in bowel habits, loss of appetite, bloody stools.   Resp:    No chest wall deformity  Skin: no rash or lesions.  GU: no dysuria, change in color of urine, no urgency or frequency.  No flank pain, no hematuria   MS:  No joint pain or swelling.  No decreased range of motion.  No back pain.  Neuro : Dementia +   Physical Exam  Pulse 73   SpO2 97%   GEN: A/Ox3; elderly on stretcher      HEENT:  [place]/AT,  EACs-clear, TMs-wnl, NOSE-clear, THROAT-clear, no lesions, no postnasal drip or exudate noted.   NECK:  Supple w/ fair ROM; no JVD; normal carotid impulses w/o bruits; no thyromegaly or nodules palpated; no lymphadenopathy.    RESP  Decreased BS in bases , no wheezing ,  no accessory muscle use, no dullness to percussion  CARD:  RRR, no m/r/g, tr  peripheral edema, pulses intact, no cyanosis or clubbing.  GI:   Soft & nt; nml bowel  sounds; no organomegaly or masses detected.   GU- chronic foley   Musco: Warm bil, no deformities or joint swelling noted. Leg /heel cushions   Neuro: alert, no focal deficits noted. ,   Skin: Warm, no lesions or rashes- scattered fluid filled blisters/crusted lesion on torso, arms, legs and feet. With scattered dressings.    Lab Results:  Imaging: Dg Chest 1 View  Result Date: 03/12/2017 CLINICAL DATA:  Fever EXAM: CHEST 1 VIEW COMPARISON:  CT and chest radiography 1 week ago. FINDINGS: Chronic cardiomegaly. Chronic aortic atherosclerosis. Right arm PICC tip in the SVC at the azygos level. Left chest is clear except for minimal atelectasis in the lower lobe. Right chest shows hazy density with volume loss in the right lower lobe, probably related to a right effusion with right lower lobe volume loss. Appearance is improved compared to the previous studies. No worsening or new findings. Old rib fractures on the right. IMPRESSION: Radiographic improvement. Some persistent volume loss in the lower lobes, right more than left. Electronically Signed   By: Paulina Fusi M.D.   On: 03/12/2017 11:33   Ct Chest Wo Contrast  Result Date: 03/07/2017 CLINICAL DATA:  Acute on chronic respiratory failure. Hypoxia and hypercapnia. History of CHF, atrial fibrillation, pulmonary vascular congestion, sepsis. EXAM: CT CHEST WITHOUT CONTRAST TECHNIQUE: Multidetector CT imaging of the chest was performed following the standard protocol without IV contrast.  COMPARISON:  Chest radiograph March 07, 2017 at 1523 hours FINDINGS: Cardiovascular: The heart is moderately enlarged. No pericardial effusions. Severe coronary artery calcifications. Ascending aorta is 4.3 by 4.7 cm in transaxial dimension with moderate calcific atherosclerosis. RIGHT PICC distal tip in the upper superior vena cava. Mediastinum/Nodes: No lymphadenopathy by CT size criteria though sensitivity decreased without intravenous contrast. Lungs/Pleura: Small to moderate RIGHT and small LEFT layering pleural effusions with underlying consolidation. Scattered calcified granulomata. Patchy to RIGHT middle lobe, RIGHT lower lobe consolidation with air bronchograms. Heterogeneous lung attenuation. No pulmonary masses, limited assessment for pulmonary nodule due to respiratory motion and habitus. Upper Abdomen: Nonacute. Streak artifact through upper abdomen, patient was scanned with arms at side. 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 Old RIGHT rib fractures. Moderate degenerative change of thoracic spine. IMPRESSION: Small to moderate RIGHT and small LEFT pleural effusions. RIGHT middle and RIGHT lower lobe suspected pneumonia. Cardiomegaly.  Findings of small airway disease. **An incidental finding of potential clinical significance has been found. 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 Ascending thoracic aortic aneurysm. Recommend semi-annual imaging followup by CTA or MRA and referral to cardiothoracic surgery if not already obtained. This recommendation follows 2010 ACCF/AHA/AATS/ACR/ASA/SCA/SCAI/SIR/STS/SVM Guidelines for the Diagnosis and Management of Patients With Thoracic Aortic Disease. Circulation. 2010; 121: Z610-R604** Electronically Signed   By: Awilda Metro M.D.   On: 03/07/2017 22:01   Dg Chest Port 1 View  Result Date: 03/07/2017 CLINICAL DATA:  Respiratory failure, history CHF, hypertension, chronic atrial fibrillation, morbid obesity EXAM:  PORTABLE CHEST 1 VIEW COMPARISON:  Portable exam 1523 hours compared to 02/26/2017 FINDINGS: RIGHT arm PICC line tip projects over SVC. Enlargement of cardiac silhouette with pulmonary vascular congestion. Increased pulmonary infiltrates particularly RIGHT with minimal RIGHT pleural effusion, likely representing pulmonary edema and CHF. No pneumothorax. Visualized osseous structures unremarkable. IMPRESSION: Enlargement of cardiac silhouette with pulmonary vascular congestion and probable mild pulmonary edema/CHF. Electronically Signed   By: Ulyses Southward M.D.   On: 03/07/2017 15:40   Dg Chest Port 1 View  Result Date: 02/26/2017 CLINICAL DATA:  PICC  line. EXAM: PORTABLE CHEST 1 VIEW COMPARISON:  02/24/2017 . FINDINGS: PICC line noted with tip over the cavoatrial junction. Cardiomegaly with pulmonary interstitial prominence. Small right pleural effusion. No pneumothorax . IMPRESSION: 1. PICC line noted with tip projected over the cavoatrial junction. 2. Congestive heart failure with pulmonary interstitial edema and small right pleural effusion . Electronically Signed   By: Maisie Fus  Register   On: 02/26/2017 11:37   Dg Chest Port 1 View  Result Date: 02/24/2017 CLINICAL DATA:  Altered mental status EXAM: PORTABLE CHEST 1 VIEW COMPARISON:  January 20, 2017 FINDINGS: There is persistent cardiomegaly. There is pulmonary venous hypertension. There is no edema or consolidation. No adenopathy. No bone lesions. IMPRESSION: Pulmonary vascular congestion without edema or consolidation. Electronically Signed   By: Bretta Bang III M.D.   On: 02/24/2017 15:08     Assessment & Plan:   OSA on CPAP Cont on CPAP At bedtime  .   PNA (pneumonia) BIlateral PNA , improved with abx  Check cxr for clearance .  Will need PCXR at SNF as unable to stand for our xray dept.   Decubitus ulcer of sacral region, unstageable (HCC) Cont follow up with SNF MD for wound care.      Rubye Oaks, NP 03/23/2017

## 2017-03-23 NOTE — Progress Notes (Signed)
I have reviewed and agree with assessment/plan.  Coralyn Helling, MD [facility] Pulmonary/Critical Care 03/23/2017, 3:17 PM Pager:  [phone]

## 2017-03-23 NOTE — Patient Instructions (Addendum)
Continue on CPAP At bedtime  .  Portable chest xray at Nursing home .fax results to Rubye Oaks NP [phone] .  Follow up Dr. Craige Cotta  In 3 months and As needed   Please contact office for sooner follow up if symptoms do not improve or worsen or seek emergency care

## 2017-03-25 DIAGNOSIS — L299 Pruritus, unspecified: Secondary | ICD-10-CM | POA: Diagnosis not present

## 2017-03-30 DIAGNOSIS — F0280 Dementia in other diseases classified elsewhere without behavioral disturbance: Secondary | ICD-10-CM | POA: Diagnosis not present

## 2017-03-30 DIAGNOSIS — R102 Pelvic and perineal pain: Secondary | ICD-10-CM | POA: Diagnosis not present

## 2017-03-30 DIAGNOSIS — L89610 Pressure ulcer of right heel, unstageable: Secondary | ICD-10-CM | POA: Diagnosis not present

## 2017-03-30 DIAGNOSIS — I872 Venous insufficiency (chronic) (peripheral): Secondary | ICD-10-CM | POA: Diagnosis not present

## 2017-03-30 DIAGNOSIS — R5381 Other malaise: Secondary | ICD-10-CM | POA: Diagnosis not present

## 2017-03-30 DIAGNOSIS — L89154 Pressure ulcer of sacral region, stage 4: Secondary | ICD-10-CM | POA: Diagnosis not present

## 2017-03-30 DIAGNOSIS — M24561 Contracture, right knee: Secondary | ICD-10-CM | POA: Diagnosis not present

## 2017-03-30 DIAGNOSIS — M79605 Pain in left leg: Secondary | ICD-10-CM | POA: Diagnosis not present

## 2017-03-30 DIAGNOSIS — L89890 Pressure ulcer of other site, unstageable: Secondary | ICD-10-CM | POA: Diagnosis not present

## 2017-03-30 DIAGNOSIS — M79604 Pain in right leg: Secondary | ICD-10-CM | POA: Diagnosis not present

## 2017-04-02 DIAGNOSIS — R102 Pelvic and perineal pain: Secondary | ICD-10-CM | POA: Diagnosis not present

## 2017-04-02 DIAGNOSIS — F0280 Dementia in other diseases classified elsewhere without behavioral disturbance: Secondary | ICD-10-CM | POA: Diagnosis not present

## 2017-04-02 DIAGNOSIS — L89154 Pressure ulcer of sacral region, stage 4: Secondary | ICD-10-CM | POA: Diagnosis not present

## 2017-04-02 DIAGNOSIS — M79604 Pain in right leg: Secondary | ICD-10-CM | POA: Diagnosis not present

## 2017-04-02 DIAGNOSIS — I872 Venous insufficiency (chronic) (peripheral): Secondary | ICD-10-CM | POA: Diagnosis not present

## 2017-04-02 DIAGNOSIS — M24561 Contracture, right knee: Secondary | ICD-10-CM | POA: Diagnosis not present

## 2017-04-02 DIAGNOSIS — R5381 Other malaise: Secondary | ICD-10-CM | POA: Diagnosis not present

## 2017-04-02 DIAGNOSIS — M79605 Pain in left leg: Secondary | ICD-10-CM | POA: Diagnosis not present

## 2017-04-06 DIAGNOSIS — L89610 Pressure ulcer of right heel, unstageable: Secondary | ICD-10-CM | POA: Diagnosis not present

## 2017-04-06 DIAGNOSIS — L89154 Pressure ulcer of sacral region, stage 4: Secondary | ICD-10-CM | POA: Diagnosis not present

## 2017-04-06 DIAGNOSIS — L89890 Pressure ulcer of other site, unstageable: Secondary | ICD-10-CM | POA: Diagnosis not present

## 2017-04-06 DIAGNOSIS — A4901 Methicillin susceptible Staphylococcus aureus infection, unspecified site: Secondary | ICD-10-CM | POA: Diagnosis not present

## 2017-04-08 IMAGING — MR MR HEAD W/O CM
7 of 10 series · 35 of 48 positions shown · non-contrast
Comparison: None available.

CLINICAL DATA: Initial evaluation for acute encephalopathy,
confusion, elevated white blood cell count.

EXAM:
MRI HEAD WITHOUT CONTRAST
TECHNIQUE: Multiplanar, multiecho pulse sequences of the brain and surrounding
structures were obtained without intravenous contrast.

[Series 3: DWI · axial · 5.0mm · 0.94mm/px · z∈[-11,+136]mm · 7 of 58 slices shown (1 of 2)]
[im 1/58]
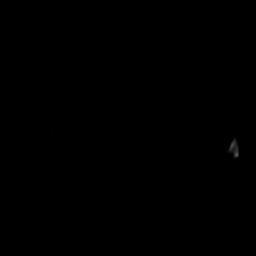
[im 10/58]
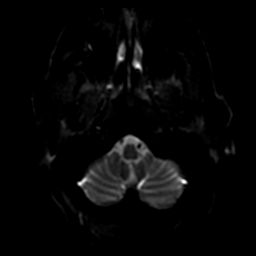
[im 20/58]
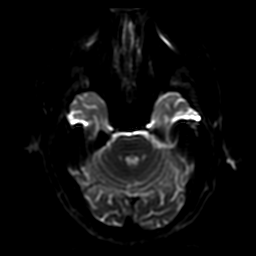
[im 29/58]
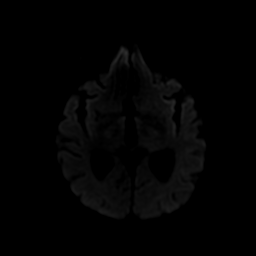
[im 39/58]
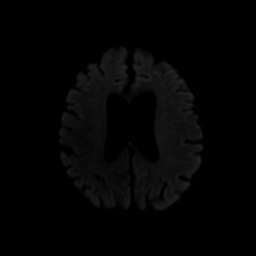
[im 48/58]
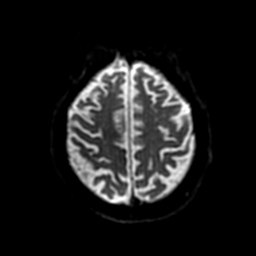
[im 58/58]
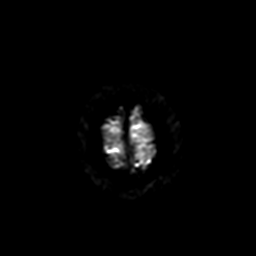

[Series 5: FLAIR · axial · 5.0mm · 0.47mm/px · z∈[-12,+137]mm · 4 of 27 slices shown (1 of 2)]
[im 1/27]
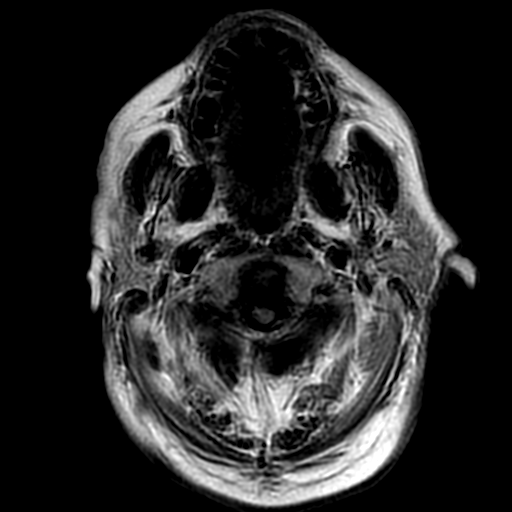
[im 9/27]
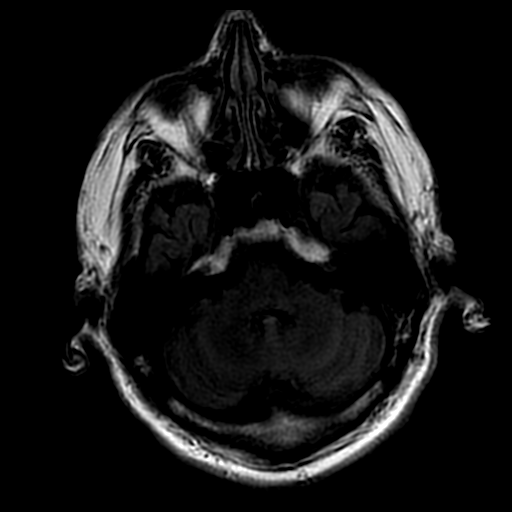
[im 18/27]
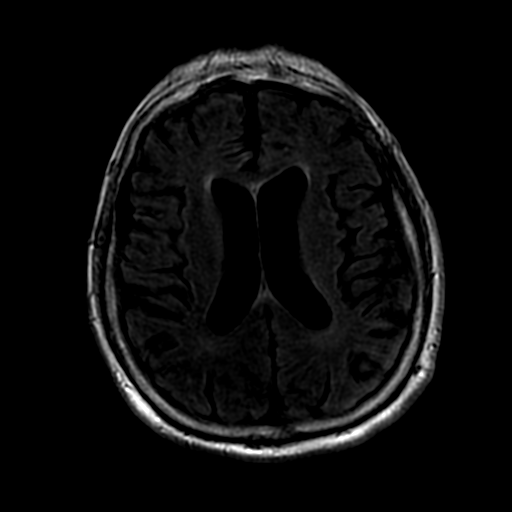
[im 27/27]
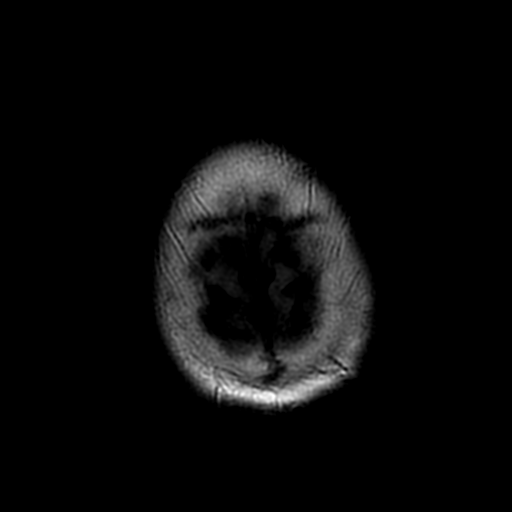

[Series 7: DWI · coronal · 5.0mm · 0.94mm/px · 9 of 62 slices shown (2 of 2)]
[im 1/62]
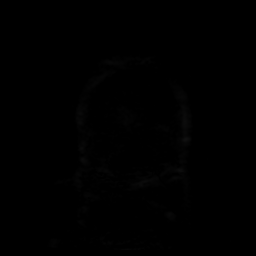
[im 8/62]
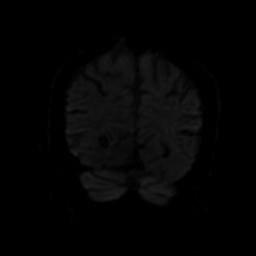
[im 16/62]
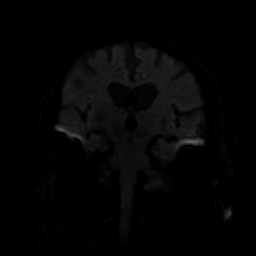
[im 23/62]
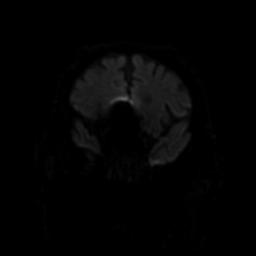
[im 31/62]
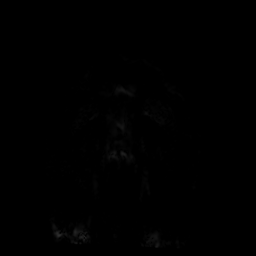
[im 39/62]
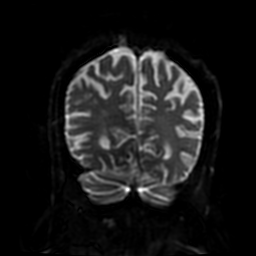
[im 46/62]
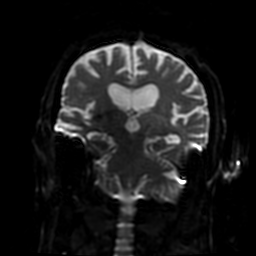
[im 54/62]
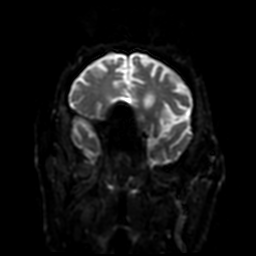
[im 62/62]
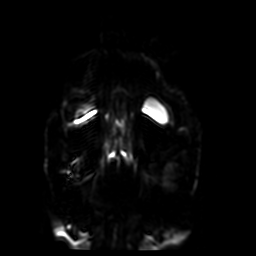

[Series 8: FLAIR · sagittal · 5.0mm · 0.49mm/px · 4 of 27 slices shown (2 of 2)]
[im 1/27]
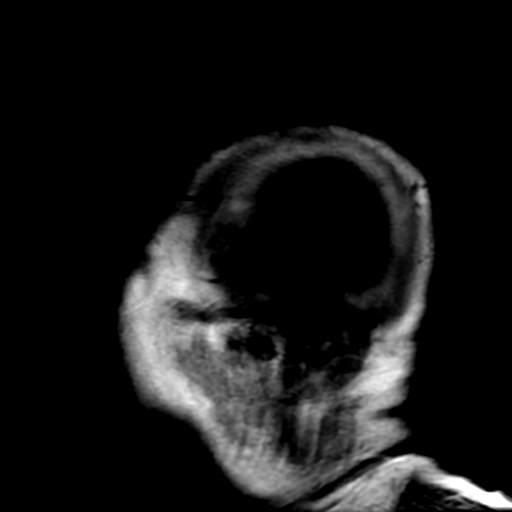
[im 9/27]
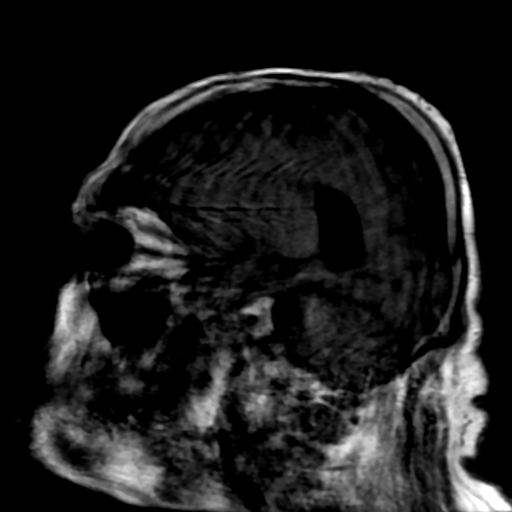
[im 18/27]
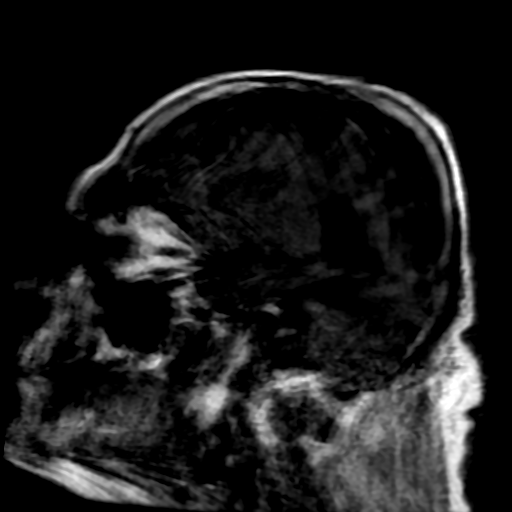
[im 27/27]
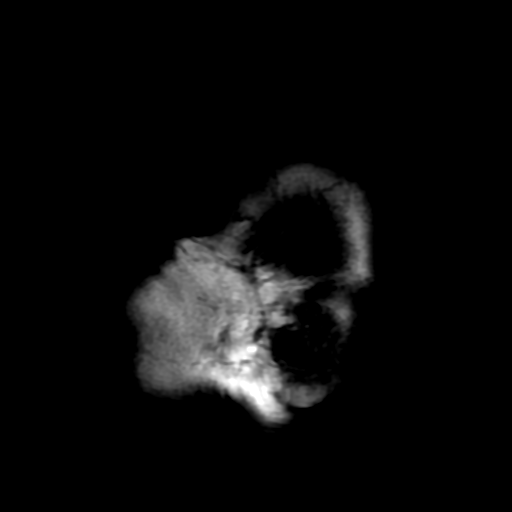

[Series 10: T2 · axial · 5.0mm · 0.47mm/px · z∈[-12,+85]mm · 3 of 27 slices shown]
[im 1/27]
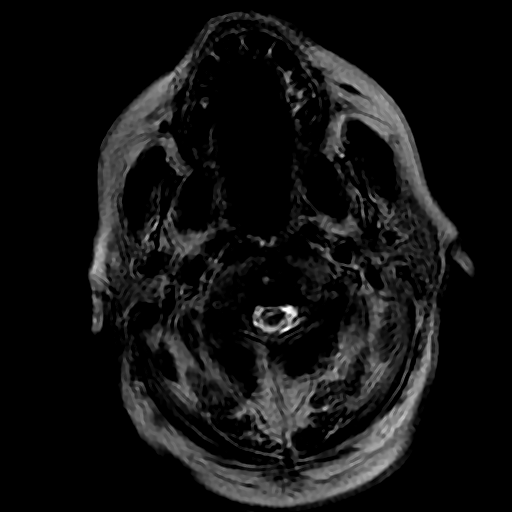
[im 9/27]
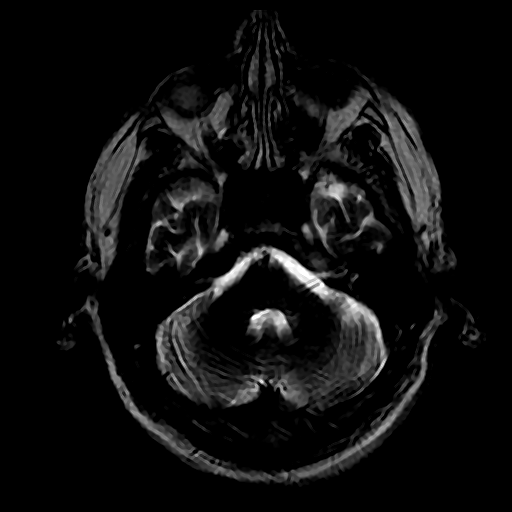
[im 18/27]
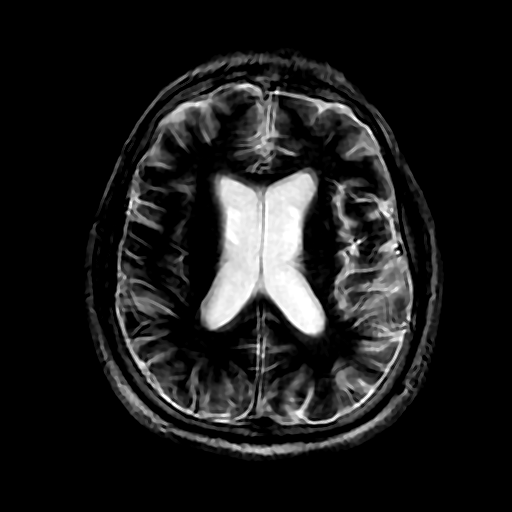

[Series 350: ADC · axial · 5.0mm · 0.94mm/px · z∈[-11,+136]mm · 4 of 29 slices shown (1 of 2)]
[im 1/29]
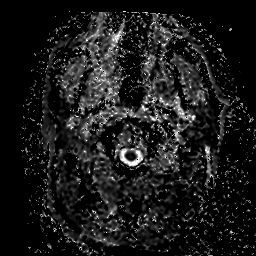
[im 10/29]
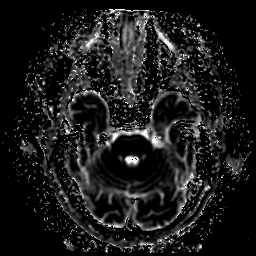
[im 19/29]
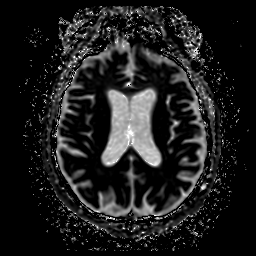
[im 29/29]
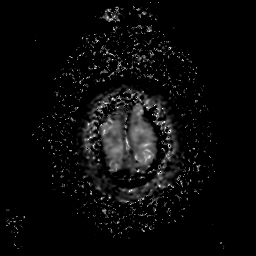

[Series 750: ADC · coronal · 5.0mm · 0.94mm/px · 4 of 31 slices shown (2 of 2)]
[im 1/31]
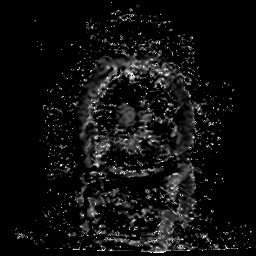
[im 11/31]
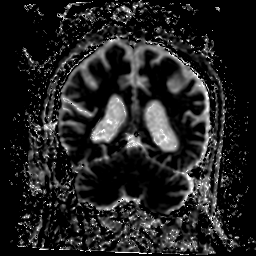
[im 21/31]
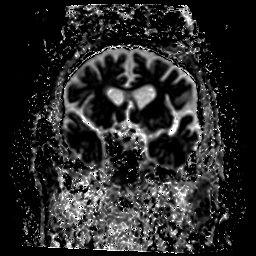
[im 31/31]
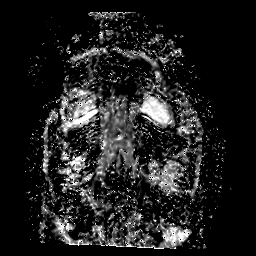

[35 of 48 positions shown; findings below may reference images not displayed]

FINDINGS: Brain: Study significantly degraded by motion artifact.

Diffuse prominence of the CSF containing spaces is compatible with
generalized age related cerebral atrophy. Mild patchy T2/FLAIR
hyperintensity within the periventricular and deep white matter both
cerebral hemispheres most consistent with chronic small vessel
ischemic disease.

No abnormal foci of restricted diffusion to suggest acute or
subacute ischemia. Gray-white matter differentiation maintained. No
evidence for acute or chronic intracranial hemorrhage. No areas of
chronic infarction identified.

No mass lesion, midline shift or mass effect. Mild ventricular
prominence related to global parenchymal volume loss of
hydrocephalus. No extra-axial fluid collection. Major dural sinuses
are grossly patent.

Pituitary gland not well evaluated on this motion degraded study.

Vascular: Major intracranial vascular flow voids maintained.

Skull and upper cervical spine: Craniocervical junction grossly
unremarkable. No obvious abnormality within the upper cervical
spine. Bone marrow signal intensity within normal limits. No scalp
soft tissue abnormality.

Sinuses/Orbits: Globes and orbital soft tissues grossly
unremarkable. No significant paranasal sinus disease identified on
this motion degraded study. No obvious mastoid effusion.
IMPRESSION: 1. Motion degraded study.
2. No acute intracranial infarct or other process identified.
3. Generalized age-related cerebral atrophy with mild chronic small
vessel ischemic disease.

## 2017-04-09 DIAGNOSIS — L89309 Pressure ulcer of unspecified buttock, unspecified stage: Secondary | ICD-10-CM | POA: Diagnosis not present

## 2017-04-10 DIAGNOSIS — M24561 Contracture, right knee: Secondary | ICD-10-CM | POA: Diagnosis not present

## 2017-04-10 DIAGNOSIS — R102 Pelvic and perineal pain: Secondary | ICD-10-CM | POA: Diagnosis not present

## 2017-04-10 DIAGNOSIS — R5381 Other malaise: Secondary | ICD-10-CM | POA: Diagnosis not present

## 2017-04-10 DIAGNOSIS — M79604 Pain in right leg: Secondary | ICD-10-CM | POA: Diagnosis not present

## 2017-04-10 DIAGNOSIS — M79605 Pain in left leg: Secondary | ICD-10-CM | POA: Diagnosis not present

## 2017-04-10 DIAGNOSIS — I872 Venous insufficiency (chronic) (peripheral): Secondary | ICD-10-CM | POA: Diagnosis not present

## 2017-04-10 DIAGNOSIS — F0280 Dementia in other diseases classified elsewhere without behavioral disturbance: Secondary | ICD-10-CM | POA: Diagnosis not present

## 2017-04-10 DIAGNOSIS — L89154 Pressure ulcer of sacral region, stage 4: Secondary | ICD-10-CM | POA: Diagnosis not present

## 2017-04-13 DIAGNOSIS — L89309 Pressure ulcer of unspecified buttock, unspecified stage: Secondary | ICD-10-CM | POA: Diagnosis not present

## 2017-04-14 ENCOUNTER — Non-Acute Institutional Stay (SKILLED_NURSING_FACILITY): Payer: Medicare HMO | Admitting: Adult Health

## 2017-04-14 ENCOUNTER — Encounter: Payer: Self-pay | Admitting: Adult Health

## 2017-04-14 DIAGNOSIS — I5032 Chronic diastolic (congestive) heart failure: Secondary | ICD-10-CM

## 2017-04-14 DIAGNOSIS — L299 Pruritus, unspecified: Secondary | ICD-10-CM | POA: Diagnosis not present

## 2017-04-14 DIAGNOSIS — L89154 Pressure ulcer of sacral region, stage 4: Secondary | ICD-10-CM | POA: Diagnosis not present

## 2017-04-14 DIAGNOSIS — F0391 Unspecified dementia with behavioral disturbance: Secondary | ICD-10-CM

## 2017-04-14 DIAGNOSIS — R531 Weakness: Secondary | ICD-10-CM

## 2017-04-14 DIAGNOSIS — D638 Anemia in other chronic diseases classified elsewhere: Secondary | ICD-10-CM

## 2017-04-14 DIAGNOSIS — G894 Chronic pain syndrome: Secondary | ICD-10-CM | POA: Diagnosis not present

## 2017-04-14 DIAGNOSIS — Z22322 Carrier or suspected carrier of Methicillin resistant Staphylococcus aureus: Secondary | ICD-10-CM | POA: Diagnosis not present

## 2017-04-14 DIAGNOSIS — I4891 Unspecified atrial fibrillation: Secondary | ICD-10-CM

## 2017-04-14 DIAGNOSIS — G4733 Obstructive sleep apnea (adult) (pediatric): Secondary | ICD-10-CM | POA: Diagnosis not present

## 2017-04-14 DIAGNOSIS — K219 Gastro-esophageal reflux disease without esophagitis: Secondary | ICD-10-CM

## 2017-04-14 NOTE — Progress Notes (Signed)
Patient ID: Jesus [name], male   DOB: 05-06-1946, 71 y.o.   MRN: [id number]    DATE:   04/14/2017   MRN:  [id number]  BIRTHDAY: [birth year]  Facility:  Nursing Home Location:  [facility]  Nursing Home Room Number: [id number]  LEVEL OF CARE:  SNF (31)  Contact Information    Name Relation Home Work Mobile   [facility] Daughter   [phone]   Belizaire,[name] Son   [phone]   [facility] Daughter   [phone]   Mollie Germany Sister   [phone]       Code Status History    Date Active Date Inactive Code Status Order ID Comments User Context   02/24/2017  7:07 PM 03/17/2017  7:28 PM DNR [id number]  Michael Litter, MD ED   01/08/2017  2:26 PM 01/24/2017  2:26 AM DNR [id number]  Canary Brim, NP Inpatient   12/30/2016  5:48 AM 01/08/2017  2:26 PM Full Code [id number]  Hillary Bow, DO ED   11/24/2016  1:24 PM 11/29/2016  3:07 PM Full Code [id number]  Gwenyth Bender, NP ED   11/13/2016 10:41 PM 11/17/2016  9:47 PM Full Code [id number]  Eduard Clos, MD Inpatient   07/09/2015 12:40 AM 07/18/2015  5:56 PM Full Code [id number]  Eduard Clos, MD Inpatient   01/04/2015 10:39 PM 01/12/2015  7:37 PM Full Code [id number]  Inez Catalina, MD Inpatient    Questions for Most Recent Historical Code Status (Order [id number])    Question Answer Comment   In the event of cardiac or respiratory ARREST Do not call a "code blue"    In the event of cardiac or respiratory ARREST Do not perform Intubation, CPR, defibrillation or ACLS    In the event of cardiac or respiratory ARREST Use medication by any route, position, wound care, and other measures to relive pain and suffering. May use oxygen, suction and manual treatment of airway obstruction as needed for comfort.         Advance Directive Documentation     Most Recent Value  Type of Advance Directive  Out of facility DNR (pink MOST or yellow form)  Pre-existing out of facility DNR order (yellow form or pink MOST form)  -   "MOST" Form in Place?  -       Chief Complaint  Patient presents with  . Medical Management of Chronic Issues    HISTORY OF PRESENT ILLNESS:  This is a 70-YO male who is being seen for a routine visit. He is currently having a short-term rehabilitation @ [address] [facility]. He was seen in the room and was conversant. Multiple blisters on body has been noted to be drying up and scabbing.   He was readmitted to [facility] on 03/17/2017 following an admission at [facility] 02/24/2017-03/17/2017 for altered mental status and probable sepsis, and was found to have acute hypoxic and hypercarbic respiratory failure and CHF exacerbation.   He was diuresed and placed on BiPAP. He was negative 15.3L .He was followed by palliative medicine with poor prognosis. He was treated, as well, for UTI with Ceftriaxone then changed to [facility] since culture grew Providencia. He was transfused 2 units PRBC on 3/22. He has multiple sacral wounds for which he received wound treatments. He has PMH of atrial fibrillation on Eliquis, CKD 3, chronic diastolic heart failure, hypertension, chronic anemia, chronic pain, chronic venous insufficiency, chronic edema, cirrhosis of the liver based CT, incidental lesions on  his liver and pancreas seen on prior CT imaging.  Wound culture showed MRSA and was ordered to start on Vancomycin. However, he pulled out his PICC line twice in a row. Vancomycin was changed to PO Clindamycin. He was recently started on Atarax PRN for pruritus.    PAST MEDICAL HISTORY:  Past Medical History:  Diagnosis Date  . Acute encephalopathy   . Acute on chronic renal failure (HCC)   . Acute pulmonary edema (HCC)   . Acute respiratory failure with hypoxia and hypercapnia (HCC) 02/2017  . Arthritis    "normal for my age" (11/13/2016)  . Atrial fibrillation (HCC)   . Cellulitis and abscess of leg 11/2016  . Chronic anemia   . Chronic atrial fibrillation (HCC)   . Chronic  diastolic CHF (congestive heart failure) (HCC) 12/2002   a. EF 50-55% by echo 04/2012  . Decubitus ulcer of buttock, unstageable (HCC) 12/31/2016  . Edema of lower extremity   . Goals of care, counseling/discussion   . Gout   . H/O: GI bleed    "cause I took too much aspirin"  . High cholesterol   . Hyperglycemia    Noted 05/2012  . Hypertension   . Hypoxemia   . Morbid obesity (HCC)   . OSA on CPAP   . Palliative care encounter   . Peripheral edema   . PVD (peripheral vascular disease) (HCC)   . Sepsis secondary to UTI (HCC)   . Venous stasis ulcer (HCC)      CURRENT MEDICATIONS: Reviewed  Patient's Medications  New Prescriptions   No medications on file  Previous Medications   ACETAMINOPHEN (TYLENOL) 500 MG TABLET    Take 500 mg by mouth every 8 (eight) hours as needed for moderate pain (Pain score 4-6/10).   AMINO ACIDS-PROTEIN HYDROLYS (FEEDING SUPPLEMENT, PRO-STAT SUGAR FREE 64,) LIQD    Take 30 mLs by mouth every 6 (six) hours.   APIXABAN (ELIQUIS) 5 MG TABS TABLET    Take 1 tablet (5 mg total) by mouth 2 (two) times daily.   ATORVASTATIN (LIPITOR) 80 MG TABLET    Take 1 tablet (80 mg total) by mouth daily.   CARVEDILOL (COREG) 12.5 MG TABLET    Take 12.5 mg by mouth 2 (two) times daily with a meal.   CLINDAMYCIN (CLEOCIN) 150 MG CAPSULE    Take 150 mg by mouth 3 (three) times daily.   CLINDAMYCIN (CLEOCIN) 300 MG CAPSULE    Take 300 mg by mouth 3 (three) times daily. Give with a 150 mg tablet to = 450 mg   DIVALPROEX (DEPAKOTE ER) 250 MG 24 HR TABLET    Take 250 mg by mouth every morning.    DIVALPROEX (DEPAKOTE SPRINKLE) 125 MG CAPSULE    Take 125 mg by mouth at bedtime.   FAMOTIDINE (PEPCID) 20 MG TABLET    Take 20 mg by mouth 2 (two) times daily.   FUROSEMIDE (LASIX) 40 MG TABLET    Take 1 tablet (40 mg total) by mouth 2 (two) times daily.   HYDROCODONE-ACETAMINOPHEN (NORCO/VICODIN) 5-325 MG TABLET    Take 1-2 tablets by mouth every 6 (six) hours as needed for moderate  pain.   HYDROXYZINE (ATARAX/VISTARIL) 25 MG TABLET    Take 25 mg by mouth 3 (three) times daily as needed for itching.   LACTULOSE (CHRONULAC) 10 GM/15ML SOLUTION    Take 20 g by mouth every other day. Give 30 mL    MEMANTINE (NAMENDA) 5 MG TABLET  Take 5 mg by mouth 2 (two) times daily.   MULTIPLE VITAMIN (MULTIVITAMIN WITH MINERALS) TABS TABLET    Take 1 tablet by mouth daily.   PANTOPRAZOLE (PROTONIX) 40 MG TABLET    Take 1 tablet (40 mg total) by mouth at bedtime.   POTASSIUM CHLORIDE SA (K-DUR,KLOR-CON) 20 MEQ TABLET    Take 20 mEq by mouth 2 (two) times daily.   SACCHAROMYCES BOULARDII (FLORASTOR) 250 MG CAPSULE    Take 250 mg by mouth 2 (two) times daily.    THIAMINE 100 MG TABLET    Take 1 tablet (100 mg total) by mouth daily.   VITAMINS A & D (VITAMIN A & D) OINTMENT    Apply 1 application topically 2 (two) times daily.  Modified Medications   No medications on file  Discontinued Medications   CEFPODOXIME (VANTIN) 200 MG TABLET    Take 1 tablet (200 mg total) by mouth every 12 (twelve) hours. For 2days   CLINDAMYCIN (CLEOCIN) 150 MG CAPSULE    Take by mouth 3 (three) times daily.     Allergies  Allergen Reactions  . Amlodipine Besylate Swelling and Other (See Comments)    "started off low and ended up severe; if, in fact, that is what's causing the swelling" (06/03/12)  . Levaquin [Levofloxacin In D5w] Rash     REVIEW OF SYSTEMS:  GENERAL:  no fever, chills EYES: Denies change in vision, dry eyes, eye pain, itching or discharge EARS: Denies change in hearing, ringing in ears, or earache NOSE: Denies nasal congestion or epistaxis MOUTH and THROAT: Denies oral discomfort, gingival pain or bleeding, pain from teeth or hoarseness   RESPIRATORY: no cough, SOB, DOE, wheezing, hemoptysis CARDIAC: no chest pain, edema or palpitations GI: no abdominal pain, diarrhea, constipation, heart burn, nausea or vomiting GU: Denies dysuria, frequency, hematuria PSYCHIATRIC: Denies feeling  of depression or anxiety. No report of hallucinations, insomnia, paranoia, or agitation     PHYSICAL EXAMINATION  GENERAL APPEARANCE:  In no acute distress. Obese SKIN:  Has multiple blisters drying up, sacral decub stage 4,  Right heel has eschar, right 5th metatarsal slough HEAD: Normal in size and contour. No evidence of trauma EYES: Lids open and close normally. No blepharitis, entropion or ectropion. PERRL. Conjunctivae are clear and sclerae are white. Lenses are without opacity EARS: Pinnae are normal. Patient hears normal voice tunes of the examiner MOUTH and THROAT: Lips are without lesions. Oral mucosa is moist and without lesions. Tongue is normal in shape, size, and color and without lesions NECK: supple, trachea midline, no neck masses, no thyroid tenderness, no thyromegaly LYMPHATICS: no LAN in the neck, no supraclavicular LAN RESPIRATORY: breathing is even & unlabored, BS CTAB CARDIAC: RRR, no murmur,no extra heart sounds, no edema GI: abdomen soft, normal BS, no masses, no tenderness, no hepatomegaly, no splenomegaly  GU:  Has foley catheter draining to urine bag EXTREMITIES:  Able to move X 4 extremities but has generalized weakness, left knee has contraction PSYCHIATRIC: Alert to self, disoriented to time and place. Affect and behavior are appropriate    LABS/RADIOLOGY: Labs reviewed: Basic Metabolic Panel:  Recent Labs  29/56/21 0530 03/10/17 0538 03/11/17 0312  03/15/17 0937 03/16/17 0323 03/17/17 0747 03/20/17  NA 137  --   --   < > 134* 135 135 136*  K 3.5  --   --   < > 4.1 3.5 3.3* 4.1  CL 92*  --   --   < > 88* 87* 90*  --  CO2 35*  --   --   < > 33* 37* 36*  --   GLUCOSE 77  --   --   < > 82 100* 93  --   BUN 11  --   --   < > 14 15 16  31*  CREATININE 0.81  --   --   < > 0.78 0.81 0.91 1.1  CALCIUM 8.5*  --   --   < > 8.5* 8.7* 8.6*  --   MG 1.8 1.7 1.7  --   --   --   --   --   PHOS 3.5 3.3 2.6  --   --   --   --   --   < > = values in this  interval not displayed. Liver Function Tests:  Recent Labs  02/26/17 0334 02/27/17 0447 02/28/17 0410  AST 14* 17 15  ALT 12* 12* 11*  ALKPHOS 132* 139* 126  BILITOT 1.3* 1.2 1.0  PROT 5.1* 5.6* 5.4*  ALBUMIN 1.7* 1.7* 1.6*    Recent Labs  12/29/16 1916  LIPASE 27    Recent Labs  02/27/17 1447 03/03/17 1800 03/07/17 1400  AMMONIA 51* 27 20   CBC:  Recent Labs  02/17/17 1803 02/24/17 1421  03/15/17 0937 03/16/17 0323 03/17/17 0747 03/20/17  WBC 8.4 8.4  < > 6.5 6.5 8.2 5.8  NEUTROABS 6.2 6.1  --   --   --   --  1  HGB 6.9* 8.0*  < > 9.2* 9.1* 9.6* 8.1*  HCT 21.8* 26.2*  < > 29.0* 29.6* 30.5* 26*  MCV 93.2 93.2  < > 91.2 91.4 91.6  --   PLT 303 344  < > 373 386 356 319  < > = values in this interval not displayed. Cardiac Enzymes:  Recent Labs  12/30/16 0618  CKTOTAL 29*   CBG:  Recent Labs  03/02/17 1649 03/13/17 1707 03/16/17 1640  GLUCAP 129* 95 152*      ASSESSMENT/PLAN:  Generalized weakness - continue rehabilitation, PT and OT, for therapeutic and strengthening exercises; fall precautions  MRSA culture positive wound - continue Clindamycin 150 mg + 300 mg PO TID X 30 days  Pruritus - recently started on Atarax 25 mg PO TID PRN  Chronic diastolic CHF - continue Lasix 40 mg 1 tab by mouth twice a day, check BMP  Atrial fibrillation - rate controlled; continue Eliquis 5 mg 1 tab by mouth twice a day, Coreg 12.5 mg 1 tab by mouth twice a day  Dementia with behavioral disturbance  - continue Namenda 5 mg 1 tab by mouth twice a day, Depakote DR 250 mg 1 tab by mouth Q AM and 125 mg Q HS   GERD - continue Protonix 40 mg 1 tab by mouth daily at bedtime and Pepcid 20 mg 1 tab by mouth twice a day  Sacral pressure ulcer, stage IV - continue wound treatment daily, air mattress, turn to sides and keep skin clean and dry  Obstructive sleep apnea - continue BiPAP at at bedtime, settings 10/5, rate 12, 3-4 L O2 flow, FiO2 30%  Chronic pain -  continue Norco 5/325 mg 1-2 tabs by mouth every 6 hours when necessary and Tylenol extra strength 500 mg 1 tab by mouth every 8 hours when necessary; followed by physiatry  Anemia of chronic disease - hgb 8.1, re- check CBC     Goals of care:  Short-term rehabilitation, palliative care  Twilia Yaklin C. Solon - NP    [employer] [phone]

## 2017-04-15 LAB — BASIC METABOLIC PANEL
BUN: 16 mg/dL (ref 4–21)
Creatinine: 0.7 mg/dL (ref 0.6–1.3)
Glucose: 93 mg/dL
Potassium: 3.7 mmol/L (ref 3.4–5.3)
Sodium: 139 mmol/L (ref 137–147)

## 2017-04-15 LAB — CBC AND DIFFERENTIAL
HEMATOCRIT: 23 % — AB (ref 41–53)
HEMOGLOBIN: 7.2 g/dL — AB (ref 13.5–17.5)
Platelets: 313 10*3/uL (ref 150–399)
WBC: 5 10*3/mL

## 2017-04-17 ENCOUNTER — Encounter: Payer: Self-pay | Admitting: Internal Medicine

## 2017-04-17 ENCOUNTER — Non-Acute Institutional Stay (SKILLED_NURSING_FACILITY): Payer: Medicare HMO | Admitting: Internal Medicine

## 2017-04-17 DIAGNOSIS — D721 Eosinophilia, unspecified: Secondary | ICD-10-CM

## 2017-04-17 DIAGNOSIS — D649 Anemia, unspecified: Secondary | ICD-10-CM | POA: Diagnosis not present

## 2017-04-17 DIAGNOSIS — I4891 Unspecified atrial fibrillation: Secondary | ICD-10-CM | POA: Diagnosis not present

## 2017-04-17 DIAGNOSIS — R7881 Bacteremia: Secondary | ICD-10-CM | POA: Diagnosis not present

## 2017-04-17 DIAGNOSIS — B9562 Methicillin resistant Staphylococcus aureus infection as the cause of diseases classified elsewhere: Secondary | ICD-10-CM

## 2017-04-17 NOTE — Progress Notes (Signed)
LOCATION: [facility]  PCP: Gerre Pebbles, [name]   Code Status: DNR  Goals of care: Advanced Directive information Advanced Directives 04/14/2017  Does Patient Have a Medical Advance Directive? Yes  Type of Advance Directive Out of facility DNR (pink MOST or yellow form)  Does patient want to make changes to medical advance directive? No - Patient declined  Copy of Healthcare Power of Attorney in Chart? -  Would patient like information on creating a medical advance directive? -  Pre-existing out of facility DNR order (yellow form or pink MOST form) -       Extended Emergency Contact Information Primary Emergency Contact: [facility] Address: [address]           [address]          [place], [place] [address] [place] Phone: [phone] Relation: Daughter Secondary Emergency Contact: Carre,[name]  [place] Mobile Phone: [phone] Relation: Son   Allergies  Allergen Reactions  . Amlodipine Besylate Swelling and Other (See Comments)    "started off low and ended up severe; if, in fact, that is what's causing the swelling" (06/03/12)  . Levaquin [Levofloxacin In D5w] Rash    Chief Complaint  Patient presents with  . Acute Visit    Abnormal drop in hemoglobin     HPI:  Patient is a 71 y.o. male seen today for acute visit. He had lab work showing drop in his Hemoglobin on 04/15/17. He is currently on eliquis for anticoagulation with his history of afib. He is being treated for MRSA bacteremia with clindamycin.   Review of Systems:  Constitutional: Negative for fever, chills.  HENT: Negative for headache Eyes: Negative for double vision and discharge.  Respiratory: Negative for cough, shortness of breath.   Cardiovascular: Negative for chest pain, palpitations Gastrointestinal: Negative for heartburn, nausea, vomiting, abdominal pain. Denies diarrhea and blood in stool Genitourinary: has foley catheter, no  hematuria Skin: Negative for itching, rash.  Neurological: Negative for dizziness.   Past Medical History:  Diagnosis Date  . Acute encephalopathy   . Acute on chronic renal failure (HCC)   . Acute pulmonary edema (HCC)   . Acute respiratory failure with hypoxia and hypercapnia (HCC) 02/2017  . Arthritis    "normal for my age" (11/13/2016)  . Atrial fibrillation (HCC)   . Cellulitis and abscess of leg 11/2016  . Chronic anemia   . Chronic atrial fibrillation (HCC)   . Chronic diastolic CHF (congestive heart failure) (HCC) 12/2002   a. EF 50-55% by echo 04/2012  . Decubitus ulcer of buttock, unstageable (HCC) 12/31/2016  . Edema of lower extremity   . Goals of care, counseling/discussion   . Gout   . H/O: GI bleed    "cause I took too much aspirin"  . High cholesterol   . Hyperglycemia    Noted 05/2012  . Hypertension   . Hypoxemia   . Morbid obesity (HCC)   . OSA on CPAP   . Palliative care encounter   . Peripheral edema   . PVD (peripheral vascular disease) (HCC)   . Sepsis secondary to UTI (HCC)   . Venous stasis ulcer (HCC)    Past Surgical History:  Procedure Laterality Date  . DEBRIDMENT OF DECUBITUS ULCER N/A 01/02/2017   Procedure: DEBRIDMENT OF DECUBITUS ULCER;  Surgeon: Axel Filler, MD;  Location: [facility] OR;  Service: General;  Laterality: N/A;  . NO PAST SURGERIES     Social History:  reports that he quit smoking about 51 years ago. His smoking use included Cigarettes. He has never used smokeless tobacco. He reports that he drinks about 8.4 oz of alcohol per week . He reports that he does not use drugs.  Family History  Problem Relation Age of Onset  . Arrhythmia Father   . Hypertension Father   . Cancer Mother        cervical    Medications: Allergies as of 04/17/2017      Reactions   Amlodipine Besylate Swelling, Other (See Comments)   "started off low and ended up severe; if, in fact, that is what's causing the swelling" (06/03/12)   Levaquin  [levofloxacin In D5w] Rash      Medication List       Accurate as of 04/17/17 11:17 AM. Always use your most recent med list.          acetaminophen 500 MG tablet Commonly known as:  TYLENOL Take 500 mg by mouth every 8 (eight) hours as needed for moderate pain (Pain score 4-6/10).   apixaban 5 MG Tabs tablet Commonly known as:  ELIQUIS Take 1 tablet (5 mg total) by mouth 2 (two) times daily.   atorvastatin 80 MG tablet Commonly known as:  LIPITOR Take 1 tablet (80 mg total) by mouth daily.   carvedilol 12.5 MG tablet Commonly known as:  COREG Take 12.5 mg by mouth 2 (two) times daily with a meal.   clindamycin 300 MG capsule Commonly known as:  CLEOCIN Take 300 mg by mouth 3 (three) times daily. Give with a 150 mg tablet to = 450 mg   clindamycin 150 MG capsule Commonly known as:  CLEOCIN Take 150 mg by mouth 3 (three) times daily.   divalproex 125 MG capsule Commonly known as:  DEPAKOTE SPRINKLE Take 125 mg by mouth at bedtime.   divalproex 250 MG 24 hr tablet Commonly known as:  DEPAKOTE ER Take 250 mg by mouth every morning.   famotidine 20 MG tablet Commonly known as:  PEPCID Take 20 mg by mouth 2 (two) times daily.   feeding supplement (PRO-STAT SUGAR FREE 64) Liqd Take 30 mLs by mouth every 6 (six) hours.   furosemide 40 MG tablet Commonly known as:  LASIX Take 1 tablet (40 mg total) by mouth 2 (two) times daily.   HYDROcodone-acetaminophen 5-325 MG tablet Commonly known as:  NORCO/VICODIN Take 1-2 tablets by mouth every 6 (six) hours as needed for moderate pain.   hydrOXYzine 25 MG tablet Commonly known as:  ATARAX/VISTARIL Take 25 mg by mouth 3 (three) times daily as needed for itching.   lactulose 10 GM/15ML solution Commonly known as:  CHRONULAC Take 20 g by mouth every other day. Give 30 mL   memantine 5 MG tablet Commonly known as:  NAMENDA Take 5 mg by mouth 2 (two) times daily.   multivitamin with minerals Tabs tablet Take 1 tablet  by mouth daily.   pantoprazole 40 MG tablet Commonly known as:  PROTONIX Take 1 tablet (40 mg total) by mouth at bedtime.   potassium chloride SA 20 MEQ tablet Commonly known as:  K-DUR,KLOR-CON Take 20 mEq by mouth 2 (two) times daily.   saccharomyces boulardii 250 MG capsule Commonly known as:  FLORASTOR Take 250 mg by mouth 2 (two) times daily.   thiamine 100 MG tablet Take 1 tablet (100 mg total) by mouth daily.   vitamin A & D ointment Apply 1 application topically 2 (two) times daily.  Immunizations:  There is no immunization history on file for this patient.   Physical Exam:  Vitals:   04/17/17 1111  BP: 136/64  Pulse: 86  Resp: 20  Temp: 98 F (36.7 C)  TempSrc: Oral  SpO2: 96%  Weight: 258 lb 6.4 oz (117.2 kg)  Height: 5\' 10"  (1.778 m)   Body mass index is 37.08 kg/m.  General- elderly male, obese, in no acute distress Head- normocephalic, atraumatic Nose- no nasal discharge Throat- moist mucus membrane Eyes- PERRLA, EOMI, no pallor, no icterus, no discharge, normal conjunctiva, normal sclera Neck- no cervical lymphadenopathy Cardiovascular- normal s1,s2, no murmur Respiratory- bilateral clear to auscultation, no wheeze, no rhonchi, no crackles, no use of accessory muscles Abdomen- bowel sounds present, soft, non tender, no guarding or rigidity, foley catheter present Musculoskeletal- able to move all 4 extremities, generalized weakness mainly to lower extremities Neurological- alert and oriented to person, place and time   Labs reviewed: Basic Metabolic Panel:  Recent Labs  16/10/96 0530 03/10/17 0538 03/11/17 0312  03/15/17 0937 03/16/17 0323 03/17/17 0747 03/20/17 04/15/17  NA 137  --   --   < > 134* 135 135 136* 139  K 3.5  --   --   < > 4.1 3.5 3.3* 4.1 3.7  CL 92*  --   --   < > 88* 87* 90*  --   --   CO2 35*  --   --   < > 33* 37* 36*  --   --   GLUCOSE 77  --   --   < > 82 100* 93  --   --   BUN 11  --   --   < > 14 15 16   31* 16  CREATININE 0.81  --   --   < > 0.78 0.81 0.91 1.1 0.7  CALCIUM 8.5*  --   --   < > 8.5* 8.7* 8.6*  --   --   MG 1.8 1.7 1.7  --   --   --   --   --   --   PHOS 3.5 3.3 2.6  --   --   --   --   --   --   < > = values in this interval not displayed. Liver Function Tests:  Recent Labs  02/26/17 0334 02/27/17 0447 02/28/17 0410  AST 14* 17 15  ALT 12* 12* 11*  ALKPHOS 132* 139* 126  BILITOT 1.3* 1.2 1.0  PROT 5.1* 5.6* 5.4*  ALBUMIN 1.7* 1.7* 1.6*    Recent Labs  12/29/16 1916  LIPASE 27    Recent Labs  02/27/17 1447 03/03/17 1800 03/07/17 1400  AMMONIA 51* 27 20   CBC:  Recent Labs  02/17/17 1803 02/24/17 1421  03/15/17 0937 03/16/17 0323 03/17/17 0747 03/20/17 04/15/17  WBC 8.4 8.4  < > 6.5 6.5 8.2 5.8 5.0  NEUTROABS 6.2 6.1  --   --   --   --  1  --   HGB 6.9* 8.0*  < > 9.2* 9.1* 9.6* 8.1* 7.2*  HCT 21.8* 26.2*  < > 29.0* 29.6* 30.5* 26* 23*  MCV 93.2 93.2  < > 91.2 91.4 91.6  --   --   PLT 303 344  < > 373 386 356 319 313  < > = values in this interval not displayed. Cardiac Enzymes:  Recent Labs  12/30/16 0618  CKTOTAL 29*   BNP: Invalid input(s): POCBNP CBG:  Recent Labs  03/02/17 1649 03/13/17 1707 03/16/17 1640  GLUCAP 129* 95 152*    Radiological Exams: No results found.  Assessment/Plan  Anemia unspecified Likely multifactorial- multiple chronic medical illness, side effect from medication, being on antibiotic among others. Will need to rule out gi bleed with him on eliquis for anticoagulation. FOBT X3 and cbc recheck in 3 days. Hemodynamically stable at present. Change his pantoprazole to 40 mg bid for gi prophylaxis until gi bleed is ruled out.   MRSA bacteremia Currently on clindamycin 450 mg tid until 05/09/17. Add florastor to prevent antibiotic associated diarrhea.  Eosinophilia Has chronic wound and bacteremia at present. Likely related to inflammation and being on clindamycin  Atrial fibrillation Controlled heart  rate this visit. Continue carvedilol 12.5 mg twice a day and eliquis 5 mg twice a day for stroke prophylaxis. If has further drop in Hb, will need to hold eliquis.   Labs/tests ordered: CBC, BMP 04/20/17  Family/ staff Communication: reviewed care plan with patient and nursing supervisor    Oneal Grout, MD Internal Medicine [facility] [address] [place], [place] [address] Cell Phone (Monday-Friday 8 am - 5 pm): [phone] On Call: [phone] and follow prompts after 5 pm and on weekends Office Phone: [phone] Office Fax: [phone]

## 2017-04-20 DIAGNOSIS — I872 Venous insufficiency (chronic) (peripheral): Secondary | ICD-10-CM | POA: Diagnosis not present

## 2017-04-20 DIAGNOSIS — L89890 Pressure ulcer of other site, unstageable: Secondary | ICD-10-CM | POA: Diagnosis not present

## 2017-04-20 DIAGNOSIS — I10 Essential (primary) hypertension: Secondary | ICD-10-CM | POA: Diagnosis not present

## 2017-04-20 DIAGNOSIS — L89154 Pressure ulcer of sacral region, stage 4: Secondary | ICD-10-CM | POA: Diagnosis not present

## 2017-04-20 DIAGNOSIS — L97812 Non-pressure chronic ulcer of other part of right lower leg with fat layer exposed: Secondary | ICD-10-CM | POA: Diagnosis not present

## 2017-04-27 DIAGNOSIS — L89890 Pressure ulcer of other site, unstageable: Secondary | ICD-10-CM | POA: Diagnosis not present

## 2017-04-27 DIAGNOSIS — L89610 Pressure ulcer of right heel, unstageable: Secondary | ICD-10-CM | POA: Diagnosis not present

## 2017-04-27 DIAGNOSIS — L97811 Non-pressure chronic ulcer of other part of right lower leg limited to breakdown of skin: Secondary | ICD-10-CM | POA: Diagnosis not present

## 2017-04-27 DIAGNOSIS — I872 Venous insufficiency (chronic) (peripheral): Secondary | ICD-10-CM | POA: Diagnosis not present

## 2017-04-27 DIAGNOSIS — L89154 Pressure ulcer of sacral region, stage 4: Secondary | ICD-10-CM | POA: Diagnosis not present

## 2017-04-27 DIAGNOSIS — L97821 Non-pressure chronic ulcer of other part of left lower leg limited to breakdown of skin: Secondary | ICD-10-CM | POA: Diagnosis not present

## 2017-04-28 DIAGNOSIS — J189 Pneumonia, unspecified organism: Secondary | ICD-10-CM | POA: Diagnosis not present

## 2017-04-28 DIAGNOSIS — I4891 Unspecified atrial fibrillation: Secondary | ICD-10-CM | POA: Diagnosis not present

## 2017-04-28 DIAGNOSIS — I110 Hypertensive heart disease with heart failure: Secondary | ICD-10-CM | POA: Diagnosis not present

## 2017-04-28 DIAGNOSIS — J90 Pleural effusion, not elsewhere classified: Secondary | ICD-10-CM | POA: Diagnosis not present

## 2017-04-28 DIAGNOSIS — Z7901 Long term (current) use of anticoagulants: Secondary | ICD-10-CM | POA: Diagnosis not present

## 2017-04-28 DIAGNOSIS — D649 Anemia, unspecified: Secondary | ICD-10-CM | POA: Diagnosis not present

## 2017-04-28 DIAGNOSIS — I517 Cardiomegaly: Secondary | ICD-10-CM | POA: Diagnosis not present

## 2017-04-28 DIAGNOSIS — I10 Essential (primary) hypertension: Secondary | ICD-10-CM | POA: Diagnosis not present

## 2017-04-28 DIAGNOSIS — E785 Hyperlipidemia, unspecified: Secondary | ICD-10-CM | POA: Diagnosis not present

## 2017-04-28 DIAGNOSIS — I5032 Chronic diastolic (congestive) heart failure: Secondary | ICD-10-CM | POA: Diagnosis not present

## 2017-04-28 DIAGNOSIS — R21 Rash and other nonspecific skin eruption: Secondary | ICD-10-CM | POA: Diagnosis not present

## 2017-04-28 DIAGNOSIS — R4182 Altered mental status, unspecified: Secondary | ICD-10-CM | POA: Diagnosis not present

## 2017-04-28 DIAGNOSIS — L89154 Pressure ulcer of sacral region, stage 4: Secondary | ICD-10-CM | POA: Diagnosis not present

## 2017-04-28 DIAGNOSIS — I272 Pulmonary hypertension, unspecified: Secondary | ICD-10-CM | POA: Diagnosis not present

## 2017-04-28 DIAGNOSIS — L89159 Pressure ulcer of sacral region, unspecified stage: Secondary | ICD-10-CM | POA: Diagnosis not present

## 2017-04-28 DIAGNOSIS — Z79899 Other long term (current) drug therapy: Secondary | ICD-10-CM | POA: Diagnosis not present

## 2017-04-28 DIAGNOSIS — I482 Chronic atrial fibrillation: Secondary | ICD-10-CM | POA: Diagnosis not present

## 2017-05-01 DIAGNOSIS — L89309 Pressure ulcer of unspecified buttock, unspecified stage: Secondary | ICD-10-CM | POA: Diagnosis not present

## 2017-05-04 DIAGNOSIS — S60522A Blister (nonthermal) of left hand, initial encounter: Secondary | ICD-10-CM | POA: Diagnosis not present

## 2017-05-04 DIAGNOSIS — L89154 Pressure ulcer of sacral region, stage 4: Secondary | ICD-10-CM | POA: Diagnosis not present

## 2017-05-04 DIAGNOSIS — L97821 Non-pressure chronic ulcer of other part of left lower leg limited to breakdown of skin: Secondary | ICD-10-CM | POA: Diagnosis not present

## 2017-05-04 DIAGNOSIS — I872 Venous insufficiency (chronic) (peripheral): Secondary | ICD-10-CM | POA: Diagnosis not present

## 2017-05-04 DIAGNOSIS — L89890 Pressure ulcer of other site, unstageable: Secondary | ICD-10-CM | POA: Diagnosis not present

## 2017-05-04 DIAGNOSIS — L89610 Pressure ulcer of right heel, unstageable: Secondary | ICD-10-CM | POA: Diagnosis not present

## 2017-05-04 DIAGNOSIS — S60521A Blister (nonthermal) of right hand, initial encounter: Secondary | ICD-10-CM | POA: Diagnosis not present

## 2017-05-04 DIAGNOSIS — L97811 Non-pressure chronic ulcer of other part of right lower leg limited to breakdown of skin: Secondary | ICD-10-CM | POA: Diagnosis not present

## 2017-05-07 DIAGNOSIS — N390 Urinary tract infection, site not specified: Secondary | ICD-10-CM | POA: Diagnosis not present

## 2017-05-15 DIAGNOSIS — N390 Urinary tract infection, site not specified: Secondary | ICD-10-CM | POA: Diagnosis not present

## 2017-05-18 DIAGNOSIS — L89154 Pressure ulcer of sacral region, stage 4: Secondary | ICD-10-CM | POA: Diagnosis not present

## 2017-05-18 DIAGNOSIS — L89890 Pressure ulcer of other site, unstageable: Secondary | ICD-10-CM | POA: Diagnosis not present

## 2017-05-18 DIAGNOSIS — I87312 Chronic venous hypertension (idiopathic) with ulcer of left lower extremity: Secondary | ICD-10-CM | POA: Diagnosis not present

## 2017-05-18 DIAGNOSIS — L89610 Pressure ulcer of right heel, unstageable: Secondary | ICD-10-CM | POA: Diagnosis not present

## 2017-05-20 DIAGNOSIS — N509 Disorder of male genital organs, unspecified: Secondary | ICD-10-CM | POA: Diagnosis not present

## 2017-05-20 DIAGNOSIS — F339 Major depressive disorder, recurrent, unspecified: Secondary | ICD-10-CM | POA: Diagnosis not present

## 2017-05-20 DIAGNOSIS — F1027 Alcohol dependence with alcohol-induced persisting dementia: Secondary | ICD-10-CM | POA: Diagnosis not present

## 2017-05-20 DIAGNOSIS — F39 Unspecified mood [affective] disorder: Secondary | ICD-10-CM | POA: Diagnosis not present

## 2017-05-25 DIAGNOSIS — L89154 Pressure ulcer of sacral region, stage 4: Secondary | ICD-10-CM | POA: Diagnosis not present

## 2017-05-25 DIAGNOSIS — L89890 Pressure ulcer of other site, unstageable: Secondary | ICD-10-CM | POA: Diagnosis not present

## 2017-05-25 DIAGNOSIS — S2092XA Blister (nonthermal) of unspecified parts of thorax, initial encounter: Secondary | ICD-10-CM | POA: Diagnosis not present

## 2017-05-25 DIAGNOSIS — L89309 Pressure ulcer of unspecified buttock, unspecified stage: Secondary | ICD-10-CM | POA: Diagnosis not present

## 2017-05-25 DIAGNOSIS — L89610 Pressure ulcer of right heel, unstageable: Secondary | ICD-10-CM | POA: Diagnosis not present

## 2017-06-05 DIAGNOSIS — L89610 Pressure ulcer of right heel, unstageable: Secondary | ICD-10-CM | POA: Diagnosis not present

## 2017-06-05 DIAGNOSIS — L89894 Pressure ulcer of other site, stage 4: Secondary | ICD-10-CM | POA: Diagnosis not present

## 2017-06-05 DIAGNOSIS — L89154 Pressure ulcer of sacral region, stage 4: Secondary | ICD-10-CM | POA: Diagnosis not present

## 2017-06-05 DIAGNOSIS — S2092XA Blister (nonthermal) of unspecified parts of thorax, initial encounter: Secondary | ICD-10-CM | POA: Diagnosis not present

## 2017-06-08 DIAGNOSIS — L89154 Pressure ulcer of sacral region, stage 4: Secondary | ICD-10-CM | POA: Diagnosis not present

## 2017-06-08 DIAGNOSIS — L89894 Pressure ulcer of other site, stage 4: Secondary | ICD-10-CM | POA: Diagnosis not present

## 2017-06-08 DIAGNOSIS — S2092XA Blister (nonthermal) of unspecified parts of thorax, initial encounter: Secondary | ICD-10-CM | POA: Diagnosis not present

## 2017-06-08 DIAGNOSIS — L89610 Pressure ulcer of right heel, unstageable: Secondary | ICD-10-CM | POA: Diagnosis not present

## 2017-06-29 DIAGNOSIS — S2092XA Blister (nonthermal) of unspecified parts of thorax, initial encounter: Secondary | ICD-10-CM | POA: Diagnosis not present

## 2017-06-29 DIAGNOSIS — L97222 Non-pressure chronic ulcer of left calf with fat layer exposed: Secondary | ICD-10-CM | POA: Diagnosis not present

## 2017-06-29 DIAGNOSIS — L89894 Pressure ulcer of other site, stage 4: Secondary | ICD-10-CM | POA: Diagnosis not present

## 2017-06-29 DIAGNOSIS — L89154 Pressure ulcer of sacral region, stage 4: Secondary | ICD-10-CM | POA: Diagnosis not present

## 2017-06-29 DIAGNOSIS — L89610 Pressure ulcer of right heel, unstageable: Secondary | ICD-10-CM | POA: Diagnosis not present

## 2017-07-02 ENCOUNTER — Other Ambulatory Visit: Payer: Self-pay | Admitting: *Deleted

## 2017-07-02 DIAGNOSIS — I87312 Chronic venous hypertension (idiopathic) with ulcer of left lower extremity: Secondary | ICD-10-CM | POA: Diagnosis not present

## 2017-07-02 DIAGNOSIS — F0150 Vascular dementia without behavioral disturbance: Secondary | ICD-10-CM | POA: Diagnosis not present

## 2017-07-02 DIAGNOSIS — I87313 Chronic venous hypertension (idiopathic) with ulcer of bilateral lower extremity: Secondary | ICD-10-CM | POA: Diagnosis not present

## 2017-07-02 DIAGNOSIS — L89154 Pressure ulcer of sacral region, stage 4: Secondary | ICD-10-CM | POA: Diagnosis not present

## 2017-07-02 DIAGNOSIS — I87311 Chronic venous hypertension (idiopathic) with ulcer of right lower extremity: Secondary | ICD-10-CM | POA: Diagnosis not present

## 2017-07-02 DIAGNOSIS — I739 Peripheral vascular disease, unspecified: Secondary | ICD-10-CM | POA: Diagnosis not present

## 2017-07-02 DIAGNOSIS — L120 Bullous pemphigoid: Secondary | ICD-10-CM | POA: Diagnosis not present

## 2017-07-02 DIAGNOSIS — I509 Heart failure, unspecified: Secondary | ICD-10-CM | POA: Diagnosis not present

## 2017-07-02 NOTE — Patient Outreach (Signed)
[employer] high risk screen. Reviewed chart for this pt to discover [facility]. He is at the end of life and receiving palliative care. He has received several skin debridement treatments. As pt is in LTC no need for outreach for [facility] services.  Zara Councilarroll C. Burgess EstelleSpinks, MSN, [facility] Hospital[facility]NP-BC Gerontological Nurse Practitioner [facility] Care Management [phone]

## 2017-07-06 DIAGNOSIS — I87312 Chronic venous hypertension (idiopathic) with ulcer of left lower extremity: Secondary | ICD-10-CM | POA: Diagnosis not present

## 2017-07-06 DIAGNOSIS — L97222 Non-pressure chronic ulcer of left calf with fat layer exposed: Secondary | ICD-10-CM | POA: Diagnosis not present

## 2017-07-06 DIAGNOSIS — L89610 Pressure ulcer of right heel, unstageable: Secondary | ICD-10-CM | POA: Diagnosis not present

## 2017-07-06 DIAGNOSIS — L89154 Pressure ulcer of sacral region, stage 4: Secondary | ICD-10-CM | POA: Diagnosis not present

## 2017-07-07 ENCOUNTER — Inpatient Hospital Stay (HOSPITAL_COMMUNITY)
Admission: EM | Admit: 2017-07-07 | Discharge: 2017-07-15 | DRG: 871 | Disposition: A | Discharge status: Hospice | Payer: Medicare HMO | Attending: Family Medicine | Admitting: Family Medicine

## 2017-07-07 ENCOUNTER — Encounter (HOSPITAL_COMMUNITY): Payer: Self-pay | Admitting: Emergency Medicine

## 2017-07-07 DIAGNOSIS — D638 Anemia in other chronic diseases classified elsewhere: Secondary | ICD-10-CM | POA: Diagnosis present

## 2017-07-07 DIAGNOSIS — L120 Bullous pemphigoid: Secondary | ICD-10-CM | POA: Diagnosis not present

## 2017-07-07 DIAGNOSIS — Z881 Allergy status to other antibiotic agents status: Secondary | ICD-10-CM

## 2017-07-07 DIAGNOSIS — G9340 Encephalopathy, unspecified: Secondary | ICD-10-CM | POA: Diagnosis present

## 2017-07-07 DIAGNOSIS — Z7901 Long term (current) use of anticoagulants: Secondary | ICD-10-CM

## 2017-07-07 DIAGNOSIS — I482 Chronic atrial fibrillation: Secondary | ICD-10-CM | POA: Diagnosis present

## 2017-07-07 DIAGNOSIS — A4102 Sepsis due to Methicillin resistant Staphylococcus aureus: Principal | ICD-10-CM | POA: Diagnosis present

## 2017-07-07 DIAGNOSIS — E7800 Pure hypercholesterolemia, unspecified: Secondary | ICD-10-CM | POA: Diagnosis present

## 2017-07-07 DIAGNOSIS — Z9989 Dependence on other enabling machines and devices: Secondary | ICD-10-CM

## 2017-07-07 DIAGNOSIS — I10 Essential (primary) hypertension: Secondary | ICD-10-CM | POA: Diagnosis present

## 2017-07-07 DIAGNOSIS — Z79899 Other long term (current) drug therapy: Secondary | ICD-10-CM | POA: Diagnosis not present

## 2017-07-07 DIAGNOSIS — I110 Hypertensive heart disease with heart failure: Secondary | ICD-10-CM | POA: Diagnosis present

## 2017-07-07 DIAGNOSIS — B9562 Methicillin resistant Staphylococcus aureus infection as the cause of diseases classified elsewhere: Secondary | ICD-10-CM

## 2017-07-07 DIAGNOSIS — N179 Acute kidney failure, unspecified: Secondary | ICD-10-CM | POA: Diagnosis present

## 2017-07-07 DIAGNOSIS — D649 Anemia, unspecified: Secondary | ICD-10-CM | POA: Diagnosis present

## 2017-07-07 DIAGNOSIS — L89150 Pressure ulcer of sacral region, unstageable: Secondary | ICD-10-CM | POA: Diagnosis not present

## 2017-07-07 DIAGNOSIS — R0603 Acute respiratory distress: Secondary | ICD-10-CM | POA: Diagnosis not present

## 2017-07-07 DIAGNOSIS — F0390 Unspecified dementia without behavioral disturbance: Secondary | ICD-10-CM | POA: Diagnosis present

## 2017-07-07 DIAGNOSIS — I739 Peripheral vascular disease, unspecified: Secondary | ICD-10-CM | POA: Diagnosis present

## 2017-07-07 DIAGNOSIS — Z6836 Body mass index (BMI) 36.0-36.9, adult: Secondary | ICD-10-CM | POA: Diagnosis not present

## 2017-07-07 DIAGNOSIS — Z7952 Long term (current) use of systemic steroids: Secondary | ICD-10-CM

## 2017-07-07 DIAGNOSIS — Z7189 Other specified counseling: Secondary | ICD-10-CM | POA: Diagnosis not present

## 2017-07-07 DIAGNOSIS — Z87891 Personal history of nicotine dependence: Secondary | ICD-10-CM | POA: Diagnosis not present

## 2017-07-07 DIAGNOSIS — K7460 Unspecified cirrhosis of liver: Secondary | ICD-10-CM

## 2017-07-07 DIAGNOSIS — G4733 Obstructive sleep apnea (adult) (pediatric): Secondary | ICD-10-CM | POA: Diagnosis not present

## 2017-07-07 DIAGNOSIS — Z888 Allergy status to other drugs, medicaments and biological substances status: Secondary | ICD-10-CM

## 2017-07-07 DIAGNOSIS — R509 Fever, unspecified: Secondary | ICD-10-CM | POA: Diagnosis not present

## 2017-07-07 DIAGNOSIS — R7881 Bacteremia: Secondary | ICD-10-CM | POA: Diagnosis not present

## 2017-07-07 DIAGNOSIS — Z66 Do not resuscitate: Secondary | ICD-10-CM | POA: Diagnosis present

## 2017-07-07 DIAGNOSIS — I5032 Chronic diastolic (congestive) heart failure: Secondary | ICD-10-CM | POA: Diagnosis present

## 2017-07-07 DIAGNOSIS — A412 Sepsis due to unspecified staphylococcus: Secondary | ICD-10-CM | POA: Diagnosis present

## 2017-07-07 DIAGNOSIS — L89154 Pressure ulcer of sacral region, stage 4: Secondary | ICD-10-CM | POA: Diagnosis present

## 2017-07-07 DIAGNOSIS — I4891 Unspecified atrial fibrillation: Secondary | ICD-10-CM | POA: Diagnosis present

## 2017-07-07 DIAGNOSIS — Z515 Encounter for palliative care: Secondary | ICD-10-CM

## 2017-07-07 LAB — CBC
HCT: 21.8 % — ABNORMAL LOW (ref 39.0–52.0)
Hemoglobin: 6.7 g/dL — CL (ref 13.0–17.0)
MCH: 28.9 pg (ref 26.0–34.0)
MCHC: 30.7 g/dL (ref 30.0–36.0)
MCV: 94 fL (ref 78.0–100.0)
Platelets: 269 10*3/uL (ref 150–400)
RBC: 2.32 MIL/uL — ABNORMAL LOW (ref 4.22–5.81)
RDW: 15.9 % — ABNORMAL HIGH (ref 11.5–15.5)
WBC: 6.6 10*3/uL (ref 4.0–10.5)

## 2017-07-07 LAB — CBG MONITORING, ED: GLUCOSE-CAPILLARY: 116 mg/dL — AB (ref 65–99)

## 2017-07-07 LAB — COMPREHENSIVE METABOLIC PANEL
ALBUMIN: 2 g/dL — AB (ref 3.5–5.0)
ALK PHOS: 113 U/L (ref 38–126)
ALT: 7 U/L — AB (ref 17–63)
AST: 12 U/L — AB (ref 15–41)
Anion gap: 9 (ref 5–15)
BILIRUBIN TOTAL: 0.6 mg/dL (ref 0.3–1.2)
BUN: 33 mg/dL — AB (ref 6–20)
CALCIUM: 8.4 mg/dL — AB (ref 8.9–10.3)
CO2: 31 mmol/L (ref 22–32)
CREATININE: 1.41 mg/dL — AB (ref 0.61–1.24)
Chloride: 96 mmol/L — ABNORMAL LOW (ref 101–111)
GFR calc Af Amer: 56 mL/min — ABNORMAL LOW (ref >60)
GFR calc non Af Amer: 49 mL/min — ABNORMAL LOW (ref >60)
GLUCOSE: 151 mg/dL — AB (ref 65–99)
Potassium: 4 mmol/L (ref 3.5–5.1)
Sodium: 136 mmol/L (ref 135–145)
TOTAL PROTEIN: 5 g/dL — AB (ref 6.5–8.1)

## 2017-07-07 LAB — POC OCCULT BLOOD, ED: FECAL OCCULT BLD: NEGATIVE

## 2017-07-07 LAB — PREPARE RBC (CROSSMATCH)

## 2017-07-07 MED ORDER — CARVEDILOL 12.5 MG PO TABS
12.5000 mg | ORAL_TABLET | Freq: Two times a day (BID) | ORAL | Status: DC
  Administered 2017-07-08 – 2017-07-14 (×11): 12.5 mg via ORAL
  Filled 2017-07-07 (×12): qty 1

## 2017-07-07 MED ORDER — APIXABAN 5 MG PO TABS
5.0000 mg | ORAL_TABLET | Freq: Two times a day (BID) | ORAL | Status: DC
  Administered 2017-07-08 – 2017-07-14 (×13): 5 mg via ORAL
  Filled 2017-07-07 (×13): qty 1

## 2017-07-07 MED ORDER — MEMANTINE HCL 5 MG PO TABS
5.0000 mg | ORAL_TABLET | Freq: Two times a day (BID) | ORAL | Status: DC
  Administered 2017-07-08 – 2017-07-14 (×13): 5 mg via ORAL
  Filled 2017-07-07 (×15): qty 1

## 2017-07-07 MED ORDER — DIVALPROEX SODIUM 250 MG PO DR TAB
250.0000 mg | DELAYED_RELEASE_TABLET | Freq: Three times a day (TID) | ORAL | Status: DC
  Administered 2017-07-08 – 2017-07-14 (×18): 250 mg via ORAL
  Filled 2017-07-07 (×22): qty 1

## 2017-07-07 MED ORDER — ACETAMINOPHEN 650 MG RE SUPP: 650.0000 mg | Freq: Four times a day (QID) | RECTAL | Status: DC | PRN

## 2017-07-07 MED ORDER — ONDANSETRON HCL 4 MG/2ML IJ SOLN
4.0000 mg | Freq: Four times a day (QID) | INTRAMUSCULAR | Status: DC | PRN
  Administered 2017-07-15: 4 mg via INTRAVENOUS
  Filled 2017-07-07: qty 2

## 2017-07-07 MED ORDER — SACCHAROMYCES BOULARDII 250 MG PO CAPS
250.0000 mg | ORAL_CAPSULE | Freq: Two times a day (BID) | ORAL | Status: DC
  Administered 2017-07-08: 250 mg via ORAL
  Filled 2017-07-07: qty 1

## 2017-07-07 MED ORDER — PREDNISONE 5 MG PO TABS
5.0000 mg | ORAL_TABLET | Freq: Every day | ORAL | Status: DC
  Administered 2017-07-08 – 2017-07-14 (×6): 5 mg via ORAL
  Filled 2017-07-07 (×9): qty 1

## 2017-07-07 MED ORDER — SODIUM CHLORIDE 0.9 % IV SOLN
10.0000 mL/h | Freq: Once | INTRAVENOUS | Status: AC
  Administered 2017-07-07: 10 mL/h via INTRAVENOUS

## 2017-07-07 MED ORDER — FUROSEMIDE 40 MG PO TABS
40.0000 mg | ORAL_TABLET | Freq: Two times a day (BID) | ORAL | Status: DC
  Administered 2017-07-08: 40 mg via ORAL
  Filled 2017-07-07: qty 1

## 2017-07-07 MED ORDER — VITAMIN B-1 100 MG PO TABS
100.0000 mg | ORAL_TABLET | Freq: Every day | ORAL | Status: DC
  Administered 2017-07-08 – 2017-07-14 (×6): 100 mg via ORAL
  Filled 2017-07-07 (×6): qty 1

## 2017-07-07 MED ORDER — ACETAMINOPHEN 325 MG PO TABS
650.0000 mg | ORAL_TABLET | Freq: Four times a day (QID) | ORAL | Status: DC | PRN
Start: 2017-07-07 — End: 2017-07-15

## 2017-07-07 MED ORDER — ATORVASTATIN CALCIUM 80 MG PO TABS
80.0000 mg | ORAL_TABLET | Freq: Every day | ORAL | Status: DC
  Administered 2017-07-08 – 2017-07-14 (×6): 80 mg via ORAL
  Filled 2017-07-07 (×6): qty 1

## 2017-07-07 MED ORDER — LORAZEPAM 2 MG/ML IJ SOLN
1.0000 mg | Freq: Once | INTRAMUSCULAR | Status: AC
  Administered 2017-07-07: 1 mg via INTRAVENOUS
  Filled 2017-07-07: qty 1

## 2017-07-07 MED ORDER — VITAMINS A & D EX OINT
1.0000 "application " | TOPICAL_OINTMENT | Freq: Two times a day (BID) | CUTANEOUS | Status: DC
  Administered 2017-07-08 (×2): 1 via TOPICAL
  Filled 2017-07-07 (×6): qty 1

## 2017-07-07 MED ORDER — ADULT MULTIVITAMIN W/MINERALS CH
1.0000 | ORAL_TABLET | Freq: Every day | ORAL | Status: DC
  Administered 2017-07-08 – 2017-07-14 (×6): 1 via ORAL
  Filled 2017-07-07 (×6): qty 1

## 2017-07-07 MED ORDER — PRO-STAT SUGAR FREE PO LIQD
30.0000 mL | Freq: Four times a day (QID) | ORAL | Status: DC
  Administered 2017-07-08 – 2017-07-14 (×14): 30 mL via ORAL
  Filled 2017-07-07 (×16): qty 30

## 2017-07-07 MED ORDER — HYDROCODONE-ACETAMINOPHEN 5-325 MG PO TABS
1.0000 | ORAL_TABLET | Freq: Four times a day (QID) | ORAL | Status: DC | PRN
  Administered 2017-07-08 – 2017-07-10 (×8): 2 via ORAL
  Administered 2017-07-10 (×2): 1 via ORAL
  Filled 2017-07-07: qty 2
  Filled 2017-07-07: qty 1
  Filled 2017-07-07 (×4): qty 2
  Filled 2017-07-07: qty 1
  Filled 2017-07-07 (×4): qty 2

## 2017-07-07 MED ORDER — ONDANSETRON HCL 4 MG PO TABS: 4.0000 mg | ORAL_TABLET | Freq: Four times a day (QID) | ORAL | Status: DC | PRN

## 2017-07-07 MED ORDER — DIPHENHYDRAMINE HCL 50 MG/ML IJ SOLN
25.0000 mg | Freq: Once | INTRAMUSCULAR | Status: AC
  Administered 2017-07-07: 25 mg via INTRAVENOUS
  Filled 2017-07-07: qty 1

## 2017-07-07 MED ORDER — POTASSIUM CHLORIDE CRYS ER 20 MEQ PO TBCR
20.0000 meq | EXTENDED_RELEASE_TABLET | Freq: Two times a day (BID) | ORAL | Status: DC
  Administered 2017-07-08 – 2017-07-12 (×9): 20 meq via ORAL
  Filled 2017-07-07 (×9): qty 1

## 2017-07-07 MED ORDER — LORATADINE 10 MG PO TABS
10.0000 mg | ORAL_TABLET | Freq: Every day | ORAL | Status: DC
  Administered 2017-07-08 – 2017-07-14 (×6): 10 mg via ORAL
  Filled 2017-07-07 (×6): qty 1

## 2017-07-07 NOTE — ED Notes (Signed)
Blood consent given by Mickey Farberulcie D, Daughter, HCPOA, given over the telephone

## 2017-07-07 NOTE — ED Triage Notes (Signed)
[facility]; Hgb 6.7, hx of anemia; pt also has scabs and blisters throughout skin, per EMS, pt oriented to person only, demonstrated repetitive questioning enroute

## 2017-07-07 NOTE — ED Provider Notes (Signed)
[facility]-EMERGENCY DEPT Provider Note   CSN: [id number] Arrival date & time: 07/07/17  1921     History   Chief Complaint Chief Complaint  Patient presents with  . Abnormal Lab  . Altered Mental Status  . Rash    HPI Jesus [name] is a 71 y.o. male.  HPI  71 y.o. male with a hx of chronic atrial fibrillation (anticoagulated with Eliquis; CHADS-Vasc score of 4), CKD 3, chronic diastolic heart failure, HTN, chronic anemia, chronic pain, chronic venous insufficiency, chronic edema, cirrhosis of the liver by CT, presents to the Emergency Department today from [facility] due to Hgb 6.7. Pt with known hx anemia. Pt denies melena or hematochezia. No N/V/D. No CP/SOB/ABD pain. No fevers. No numbness/tingling. Recent DC from admission on 03-17-17 due to Acute Hypoxic and Hypercarbic Respiratory failure/Acute Pulmonary Edema. Known anemia of Chronic disease 2/2 bone marrow suppression from hx liver cirrhosis. No other symptoms noted.   Past Medical History:  Diagnosis Date  . Acute encephalopathy   . Acute on chronic renal failure (HCC)   . Acute pulmonary edema (HCC)   . Acute respiratory failure with hypoxia and hypercapnia (HCC) 02/2017  . Arthritis    "normal for my age" (11/13/2016)  . Atrial fibrillation (HCC)   . Cellulitis and abscess of leg 11/2016  . Chronic anemia   . Chronic atrial fibrillation (HCC)   . Chronic diastolic CHF (congestive heart failure) (HCC) 12/2002   a. EF 50-55% by echo 04/2012  . Decubitus ulcer of buttock, unstageable (HCC) 12/31/2016  . Edema of lower extremity   . Goals of care, counseling/discussion   . Gout   . H/O: GI bleed    "cause I took too much aspirin"  . High cholesterol   . Hyperglycemia    Noted 05/2012  . Hypertension   . Hypoxemia   . Morbid obesity (HCC)   . OSA on CPAP   . Palliative care encounter   . Peripheral edema   . PVD (peripheral vascular disease) (HCC)   . Sepsis secondary to UTI (HCC)   . Venous stasis  ulcer (HCC)     Patient Active Problem List   Diagnosis Date Noted  . PNA (pneumonia) 03/23/2017  . Hypoxemia   . OSA on CPAP   . Acute respiratory failure with hypoxia and hypercapnia (HCC) 02/28/2017  . Acute pulmonary edema (HCC) 02/28/2017  . Sepsis secondary to UTI (HCC) 02/28/2017  . Goals of care, counseling/discussion   . Palliative care encounter   . Acute encephalopathy 02/24/2017  . Palliative care by specialist   . Decubitus ulcer of sacral region, unstageable (HCC) 12/30/2016  . Altered mental status 12/30/2016  . Hyponatremia 07/15/2015  . Hypokalemia   . Chronic diastolic CHF (congestive heart failure) (HCC) 04/02/2015  . Pulmonary vascular congestion   . Peripheral edema   . 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 Renal artery stenosis (HCC) 02/17/2014  . Peripheral vascular disease (HCC) 04/15/2013  . Morbid obesity (HCC) 06/11/2012  . Unspecified essential hypertension 05/18/2012  . Atrial fibrillation (HCC) 04/27/2012    Past Surgical History:  Procedure Laterality Date  . DEBRIDMENT OF DECUBITUS ULCER N/A 01/02/2017   Procedure: DEBRIDMENT OF DECUBITUS ULCER;  Surgeon: Axel FillerArmando Ramirez, MD;  Location: [facility] OR;  Service: General;  Laterality: N/A;  . NO PAST SURGERIES         Home Medications    Prior to Admission medications   Medication Sig Start Date End  Date Taking? Authorizing Provider  acetaminophen (TYLENOL) 500 MG tablet Take 500 mg by mouth every 8 (eight) hours as needed for moderate pain (Pain score 4-6/10).    [provider]  Amino Acids-Protein Hydrolys (FEEDING SUPPLEMENT, PRO-STAT SUGAR FREE 64,) LIQD Take 30 mLs by mouth every 6 (six) hours.    [provider]  apixaban (ELIQUIS) 5 MG TABS tablet Take 1 tablet (5 mg total) by mouth 2 (two) times daily. 07/18/15   Ghimire, Werner Lean, MD  atorvastatin (LIPITOR) 80 MG tablet Take 1 tablet (80 mg total) by mouth daily. 07/18/15   Ghimire, Werner Lean, MD   carvedilol (COREG) 12.5 MG tablet Take 12.5 mg by mouth 2 (two) times daily with a meal.    [provider]  clindamycin (CLEOCIN) 300 MG capsule Take 300 mg by mouth 3 (three) times daily. Give with a 150 mg tablet to = 450 mg    [provider]  divalproex (DEPAKOTE ER) 250 MG 24 hr tablet Take 250 mg by mouth every morning.     [provider]  divalproex (DEPAKOTE SPRINKLE) 125 MG capsule Take 125 mg by mouth at bedtime.    [provider]  famotidine (PEPCID) 20 MG tablet Take 20 mg by mouth 2 (two) times daily.    [provider]  furosemide (LASIX) 40 MG tablet Take 1 tablet (40 mg total) by mouth 2 (two) times daily. 03/17/17   Zannie Cove, MD  HYDROcodone-acetaminophen (NORCO/VICODIN) 5-325 MG tablet Take 1-2 tablets by mouth every 6 (six) hours as needed for moderate pain. 03/17/17   Zannie Cove, MD  hydrOXYzine (ATARAX/VISTARIL) 25 MG tablet Take 25 mg by mouth 3 (three) times daily as needed for itching.    [provider]  lactulose (CHRONULAC) 10 GM/15ML solution Take 20 g by mouth every other day. Give 30 mL     [provider]  memantine (NAMENDA) 5 MG tablet Take 5 mg by mouth 2 (two) times daily.    [provider]  Multiple Vitamin (MULTIVITAMIN WITH MINERALS) TABS tablet Take 1 tablet by mouth daily.    [provider]  pantoprazole (PROTONIX) 40 MG tablet Take 1 tablet (40 mg total) by mouth at bedtime. 03/17/17   Zannie Cove, MD  potassium chloride SA (K-DUR,KLOR-CON) 20 MEQ tablet Take 20 mEq by mouth 2 (two) times daily.    [provider]  saccharomyces boulardii (FLORASTOR) 250 MG capsule Take 250 mg by mouth 2 (two) times daily.  12/18/16   [provider]  thiamine 100 MG tablet Take 1 tablet (100 mg total) by mouth daily. 01/15/17   Osvaldo Shipper, MD  Vitamins A & D (VITAMIN A & D) ointment Apply 1 application topically 2 (two) times daily.    [provider]    Family History Family History  Problem Relation Age of Onset  . Arrhythmia Father   . Hypertension Father   . Cancer Mother        cervical    Social History Social History  Substance Use Topics  . Smoking status: Former Smoker    Types: Cigarettes    Quit date: 12/08/1965  . Smokeless tobacco: Never Used     Comment: "social cigarette smoker in my late teens"  . Alcohol use 8.4 oz/week    14 Cans of beer per week     Allergies   Amlodipine besylate and Levaquin [levofloxacin in d5w]   Review of Systems Review of Systems ROS reviewed  and all are negative for acute change except as noted in the HPI.  Physical Exam Updated Vital Signs BP 122/72   Pulse 64   Temp 99.1 F (37.3 C) (Rectal)   Resp 16   SpO2 100%   Physical Exam  Constitutional: He is oriented to person, place, and time. Vital signs are normal. He appears well-developed and well-nourished. No distress.  HENT:  Head: Normocephalic and atraumatic.  Right Ear: Hearing, tympanic membrane, external ear and ear canal normal.  Left Ear: Hearing, tympanic membrane, external ear and ear canal normal.  Nose: Nose normal.  Mouth/Throat: Uvula is midline, oropharynx is clear and moist and mucous membranes are normal. No trismus in the jaw. No oropharyngeal exudate, posterior oropharyngeal erythema or tonsillar abscesses.  Eyes: Pupils are equal, round, and reactive to light. Conjunctivae and EOM are normal.  Neck: Normal range of motion. Neck supple. No tracheal deviation present.  Cardiovascular: Normal rate, regular rhythm, S1 normal, S2 normal, normal heart sounds, intact distal pulses and normal pulses.   Pulmonary/Chest: Effort normal and breath sounds normal. No respiratory distress. He has no decreased breath sounds. He has no wheezes. He has no rhonchi. He has no rales.  Abdominal: Soft. Normal appearance and bowel sounds are normal. There is no tenderness.  Musculoskeletal: Normal range of motion.    Neurological: He is alert and oriented to person, place, and time.  Skin: Skin is warm and dry.  Scattered multiple wounds poor healing. No erythema. Scattered vesicles noted. Sacral wounds noted Stage 4 decubitus ulcers. No purulence. No significant signs of infection  Psychiatric: He has a normal mood and affect. His speech is normal and behavior is normal. Thought content normal.  Nursing note and vitals reviewed.  ED Treatments / Results  Labs (all labs ordered are listed, but only abnormal results are displayed) Labs Reviewed  COMPREHENSIVE METABOLIC PANEL - Abnormal; Notable for the following:       Result Value   Chloride 96 (*)    Glucose, Bld 151 (*)    BUN 33 (*)    Creatinine, Ser 1.41 (*)    Calcium 8.4 (*)    Total Protein 5.0 (*)    Albumin 2.0 (*)    AST 12 (*)    ALT 7 (*)    GFR calc non Af Amer 49 (*)    GFR calc Af Amer 56 (*)    All other components within normal limits  CBC - Abnormal; Notable for the following:    RBC 2.32 (*)    Hemoglobin 6.7 (*)    HCT 21.8 (*)    RDW 15.9 (*)    All other components within normal limits  CBG MONITORING, ED - Abnormal; Notable for the following:    Glucose-Capillary 116 (*)    All other components within normal limits  CULTURE, BLOOD (ROUTINE X 2)  CULTURE, BLOOD (ROUTINE X 2)  POC OCCULT BLOOD, ED  TYPE AND SCREEN  PREPARE RBC (CROSSMATCH)    EKG  EKG Interpretation None       Radiology No results found.  Procedures Procedures (including critical care time) CRITICAL CARE Performed by: Eston Estersyler M Estefania Kamiya   Total critical care time: 40 minutes  Critical care time was exclusive of separately billable procedures and treating other patients.  Critical care was necessary to treat or prevent imminent or life-threatening deterioration.  Critical care was time spent personally by me on the following activities: development of treatment plan with patient and/or surrogate as  well as nursing, discussions with  consultants, evaluation of patient's response to treatment, examination of patient, obtaining history from patient or surrogate, ordering and performing treatments and interventions, ordering and review of laboratory studies, ordering and review of radiographic studies, pulse oximetry and re-evaluation of patient's condition.   Medications Ordered in ED Medications  0.9 %  sodium chloride infusion (10 mL/hr Intravenous New Bag/Given 07/07/17 2222)  diphenhydrAMINE (BENADRYL) injection 25 mg (25 mg Intravenous Given 07/07/17 2146)  LORazepam (ATIVAN) injection 1 mg (1 mg Intravenous Given 07/07/17 2209)     Initial Impression / Assessment and Plan / ED Course  I have reviewed the triage vital signs and the nursing notes.  Pertinent labs & imaging results that were available during my care of the patient were reviewed by me and considered in my medical decision making (see chart for details).  Final Clinical Impressions(s) / ED Diagnoses  {I have reviewed and evaluated the relevant laboratory values.   {I have reviewed the relevant previous healthcare records.  {I obtained HPI from historian.   ED Course:  Assessment:  Pt is a 71 y.o. male  with a hx of chronic atrial fibrillation (anticoagulated with Eliquis; CHADS-Vasc score of 4), CKD 3, chronic diastolic heart failure, HTN, chronic anemia, chronic pain, chronic venous insufficiency, chronic edema, cirrhosis of the liver by CT, presents to the Emergency Department today from [facility] due to Hgb 6.7. Pt with known hx anemia. Pt denies melena or hematochezia. No N/V/D. No CP/SOB/ABD pain. No fevers. No numbness/tingling. Recent DC from admission on 03-17-17 due to Acute Hypoxic and Hypercarbic Respiratory failure/Acute Pulmonary Edema. Known anemia of Chronic disease 2/2 bone marrow suppression from hx liver cirrhosis.   On exam, pt chronically ill appearing. Nontoxic/nonseptic appearing. VSS. Afebrile. Lungs CTA. Heart RRR. Abdomen  nontender soft. Scattered multiple wounds poor healing. No erythema. Scattered vesicles noted. Sacral wounds noted Stage 4 decubitus ulcers. No purulence. No significant signs of infection. POC stool negative DRE. Exam unremarkable. CBC with Hgb 6.7. Known Anemia of Chronic Disease. Given 1 Unit PRBCs. Plan is to Admit to Obs.   Disposition/Plan:  Admit Pt acknowledges and agrees with plan  Supervising Physician Vanetta Mulders, MD  Final diagnoses:  Anemia, unspecified type    New Prescriptions New Prescriptions   No medications on file     Audry Pili, [name] 07/07/17 2230    Audry Pili, [name] 07/07/17 2231    Vanetta Mulders, MD 08/23/17 1539

## 2017-07-07 NOTE — H&P (Signed)
History and Physical    Jesus [name] ZOX:[id number] DOB: 08-10-1946 DOA: 07/07/2017  PCP: Gerre Pebblesavis, [name], [name]  Patient coming from: Skilled nursing facility.  Chief Complaint: Low hemoglobin.  HPI: Jesus [name] is a 71 y.o. male with history of chronic diastolic CHF, chronic atrial fibrillation, liver cirrhosis, chronic anemia and multiple wounds on the skin was brought to the ER after patient's hemoglobin was found to be low on a routine labs. As per the ER physician patient did not have any obvious GI bleed chest pain or shortness of breath.   ED Course: In the ER patient's lab was confirmed again to be having a hemoglobin of 6.7. Stool for occult blood was negative. Patient was admitted for transfusion of PRBC.   Review of Systems: As per HPI, rest all negative.   Past Medical History:  Diagnosis Date  . Acute encephalopathy   . Acute on chronic renal failure (HCC)   . Acute pulmonary edema (HCC)   . Acute respiratory failure with hypoxia and hypercapnia (HCC) 02/2017  . Arthritis    "normal for my age" (11/13/2016)  . Atrial fibrillation (HCC)   . Cellulitis and abscess of leg 11/2016  . Chronic anemia   . Chronic atrial fibrillation (HCC)   . Chronic diastolic CHF (congestive heart failure) (HCC) 12/2002   a. EF 50-55% by echo 04/2012  . Decubitus ulcer of buttock, unstageable (HCC) 12/31/2016  . Edema of lower extremity   . Goals of care, counseling/discussion   . Gout   . H/O: GI bleed    "cause I took too much aspirin"  . High cholesterol   . Hyperglycemia    Noted 05/2012  . Hypertension   . Hypoxemia   . Morbid obesity (HCC)   . OSA on CPAP   . Palliative care encounter   . Peripheral edema   . PVD (peripheral vascular disease) (HCC)   . Sepsis secondary to UTI (HCC)   . Venous stasis ulcer (HCC)     Past Surgical History:  Procedure Laterality Date  . DEBRIDMENT OF DECUBITUS ULCER N/A 01/02/2017   Procedure: DEBRIDMENT OF DECUBITUS ULCER;   Surgeon: Axel FillerArmando Ramirez, MD;  Location: [facility] OR;  Service: General;  Laterality: N/A;  . NO PAST SURGERIES       reports that he quit smoking about 51 years ago. His smoking use included Cigarettes. He has never used smokeless tobacco. He reports that he drinks about 8.4 oz of alcohol per week . He reports that he does not use drugs.  Allergies  Allergen Reactions  . Amlodipine Besylate Swelling and Other (See Comments)    "started off low and ended up severe; if, in fact, that is what's causing the swelling" (06/03/12)  . Levaquin [Levofloxacin In D5w] Rash    Family History  Problem Relation Age of Onset  . Arrhythmia Father   . Hypertension Father   . Cancer Mother        cervical    Prior to Admission medications   Medication Sig Start Date End Date Taking? Authorizing Provider  acetaminophen (TYLENOL) 500 MG tablet Take 500 mg by mouth every 8 (eight) hours as needed for moderate pain (Pain score 4-6/10).   Yes [provider]  Amino Acids-Protein Hydrolys (FEEDING SUPPLEMENT, PRO-STAT SUGAR FREE 64,) LIQD Take 30 mLs by mouth every 6 (six) hours.   Yes [provider]  apixaban (ELIQUIS) 5 MG TABS tablet Take 1 tablet (5 mg total) by mouth 2 (two)  times daily. 07/18/15  Yes Ghimire, Werner LeanShanker M, MD  atorvastatin (LIPITOR) 80 MG tablet Take 1 tablet (80 mg total) by mouth daily. 07/18/15  Yes Ghimire, Werner LeanShanker M, MD  carvedilol (COREG) 12.5 MG tablet Take 12.5 mg by mouth 2 (two) times daily with a meal.   Yes [provider]  divalproex (DEPAKOTE) 250 MG DR tablet Take 250 mg by mouth 3 (three) times daily.   Yes [provider]  furosemide (LASIX) 40 MG tablet Take 1 tablet (40 mg total) by mouth 2 (two) times daily. 03/17/17  Yes Zannie CoveJoseph, Preetha, MD  HYDROcodone-acetaminophen (NORCO/VICODIN) 5-325 MG tablet Take 1-2 tablets by mouth every 6 (six) hours as needed for moderate pain. 03/17/17  Yes Zannie CoveJoseph, Preetha, MD  loratadine (CLARITIN) 10 MG tablet  Take 10 mg by mouth daily.   Yes [provider]  memantine (NAMENDA) 5 MG tablet Take 5 mg by mouth 2 (two) times daily.   Yes [provider]  Multiple Vitamin (MULTIVITAMIN WITH MINERALS) TABS tablet Take 1 tablet by mouth daily.   Yes [provider]  potassium chloride SA (K-DUR,KLOR-CON) 20 MEQ tablet Take 20 mEq by mouth 2 (two) times daily.   Yes [provider]  predniSONE (DELTASONE) 5 MG tablet Take 5 mg by mouth daily with breakfast.   Yes [provider]  saccharomyces boulardii (FLORASTOR) 250 MG capsule Take 250 mg by mouth 2 (two) times daily.  12/18/16  Yes [provider]  thiamine 100 MG tablet Take 1 tablet (100 mg total) by mouth daily. 01/15/17  Yes Osvaldo ShipperKrishnan, Gokul, MD  Vitamins A & D (VITAMIN A & D) ointment Apply 1 application topically 2 (two) times daily.   Yes [provider]  pantoprazole (PROTONIX) 40 MG tablet Take 1 tablet (40 mg total) by mouth at bedtime. Patient not taking: Reported on 07/07/2017 03/17/17   Zannie CoveJoseph, Preetha, MD    Physical Exam: Vitals:   07/07/17 2220 07/07/17 2230 07/07/17 2235 07/07/17 2252  BP: 120/68 (!) 123/52 (!) 123/52 133/69  Pulse: 74 82 81 78  Resp: (!) 27 (!) 32 (!) 22 (!) 26  Temp:   98.2 F (36.8 C) 98.7 F (37.1 C)  TempSrc:    Oral  SpO2: 100% 100% 100% 96%      Constitutional: Moderately built and nourished. Vitals:   07/07/17 2220 07/07/17 2230 07/07/17 2235 07/07/17 2252  BP: 120/68 (!) 123/52 (!) 123/52 133/69  Pulse: 74 82 81 78  Resp: (!) 27 (!) 32 (!) 22 (!) 26  Temp:   98.2 F (36.8 C) 98.7 F (37.1 C)  TempSrc:    Oral  SpO2: 100% 100% 100% 96%   Eyes: Anicteric mild pallor. ENMT: No discharge from the ears eyes nose and mouth. Neck: No neck rigidity no mass felt. Respiratory: No rhonchi or crepitations. Cardiovascular: S1 and S2 heard no murmurs appreciated. Abdomen: Soft nontender bowel sounds present. No guarding or  rigidity. Musculoskeletal: Bilateral lower extremity dressing has been done. Skin: Multiple skin wounds in the sacrum and extremities. Neurologic: Alert awake oriented to time place and person. Moves all extremities. Psychiatric: Appears normal. Normal affect.   Labs on Admission: I have personally reviewed following labs and imaging studies  CBC:  Recent Labs Lab 07/07/17 1955  WBC 6.6  HGB 6.7*  HCT 21.8*  MCV 94.0  PLT 269   Basic Metabolic Panel:  Recent Labs Lab 07/07/17 1955  NA 136  K 4.0  CL 96*  CO2 31  GLUCOSE 151*  BUN 33*  CREATININE 1.41*  CALCIUM 8.4*   GFR: CrCl cannot be calculated (Unknown ideal weight.). Liver Function Tests:  Recent Labs Lab 07/07/17 1955  AST 12*  ALT 7*  ALKPHOS 113  BILITOT 0.6  PROT 5.0*  ALBUMIN 2.0*   No results for input(s): LIPASE, AMYLASE in the last 168 hours. No results for input(s): AMMONIA in the last 168 hours. Coagulation Profile: No results for input(s): INR, PROTIME in the last 168 hours. Cardiac Enzymes: No results for input(s): CKTOTAL, CKMB, CKMBINDEX, TROPONINI in the last 168 hours. BNP (last 3 results) No results for input(s): PROBNP in the last 8760 hours. HbA1C: No results for input(s): HGBA1C in the last 72 hours. CBG:  Recent Labs Lab 07/07/17 2010  GLUCAP 116*   Lipid Profile: No results for input(s): CHOL, HDL, LDLCALC, TRIG, CHOLHDL, LDLDIRECT in the last 72 hours. Thyroid Function Tests: No results for input(s): TSH, T4TOTAL, FREET4, T3FREE, THYROIDAB in the last 72 hours. Anemia Panel: No results for input(s): VITAMINB12, FOLATE, FERRITIN, TIBC, IRON, RETICCTPCT in the last 72 hours. Urine analysis:    Component Value Date/Time   COLORURINE AMBER (A) 03/12/2017 1238   APPEARANCEUR CLOUDY (A) 03/12/2017 1238   LABSPEC 1.018 03/12/2017 1238   PHURINE 5.0 03/12/2017 1238   GLUCOSEU NEGATIVE 03/12/2017 1238   HGBUR MODERATE (A) 03/12/2017 1238   BILIRUBINUR NEGATIVE  03/12/2017 1238   KETONESUR NEGATIVE 03/12/2017 1238   PROTEINUR 100 (A) 03/12/2017 1238   UROBILINOGEN 4.0 (H) 06/02/2012 1200   NITRITE NEGATIVE 03/12/2017 1238   LEUKOCYTESUR LARGE (A) 03/12/2017 1238   Sepsis Labs: @LABRCNTIP (procalcitonin:4,lacticidven:4) )No results found for this or any previous visit (from the past 240 hour(s)).   Radiological Exams on Admission: No results found.    Assessment/Plan Principal Problem:   Normochromic normocytic anemia Active Problems:   Atrial fibrillation (HCC)   Essential hypertension   Peripheral vascular disease (HCC)   Chronic diastolic CHF (congestive heart failure) (HCC)   Decubitus ulcer of sacral region, unstageable (HCC)   OSA on CPAP   Anemia    1. Normocytic normochromic anemia with worsening hemoglobin - patient has chronic anemia which is acutely worsening. Patient has already been started on transfusion. May need to check anemia panel as outpatient. Follow CBC. Patient probably can be discharged back to skilled nursing facility your hemoglobin increases appropriately. 2. Chronic diastolic CHF with last EF measured was 55-60% - continue Lasix. 3. Chronic atrial fibrillation - chest 2 vasc score of 4. Patient is on Apixaban and Coreg. 4. Hyperlipidemia on statins. 5. History of liver cirrhosis as per chart. 6. Acute renal failure - probably secondary to anemia. Check UA. If there is any further worsening of creatinine may have to hold Lasix. Note that patient is also on Apixaban. 7. Dementia on Namenda. 8. Multiple skin lesions - being followed as outpatient. 9. On prednisone - not sure why patient is on prednisone. Will need to check with patient's daughter in the morning.  I have reviewed patient's old charts and labs.   DVT prophylaxis: Apixaban. Code Status: DO NOT RESUSCITATE.  Family Communication: No family at the bedside.  Disposition Plan: Back to nursing facility. Consults called: None.  Admission status:  Observation.    Eduard Clos MD [facility] Pager [phone].  If 7PM-7AM, please contact night-coverage [email or web address] Password [facility]  07/07/2017, 11:27 PM

## 2017-07-07 NOTE — ED Notes (Signed)
Pt repeatedly asking staff to "please help me"; when staff asks patient whats wrong or what he needs help with pt states "I dont know, just please help me"

## 2017-07-08 ENCOUNTER — Observation Stay (HOSPITAL_BASED_OUTPATIENT_CLINIC_OR_DEPARTMENT_OTHER): Payer: Medicare HMO

## 2017-07-08 DIAGNOSIS — Z9989 Dependence on other enabling machines and devices: Secondary | ICD-10-CM | POA: Diagnosis not present

## 2017-07-08 DIAGNOSIS — I5032 Chronic diastolic (congestive) heart failure: Secondary | ICD-10-CM | POA: Diagnosis not present

## 2017-07-08 DIAGNOSIS — L89150 Pressure ulcer of sacral region, unstageable: Secondary | ICD-10-CM | POA: Diagnosis not present

## 2017-07-08 DIAGNOSIS — R7881 Bacteremia: Secondary | ICD-10-CM | POA: Diagnosis not present

## 2017-07-08 DIAGNOSIS — L120 Bullous pemphigoid: Secondary | ICD-10-CM | POA: Diagnosis not present

## 2017-07-08 DIAGNOSIS — D649 Anemia, unspecified: Secondary | ICD-10-CM | POA: Diagnosis not present

## 2017-07-08 DIAGNOSIS — K7460 Unspecified cirrhosis of liver: Secondary | ICD-10-CM | POA: Diagnosis not present

## 2017-07-08 DIAGNOSIS — B9562 Methicillin resistant Staphylococcus aureus infection as the cause of diseases classified elsewhere: Secondary | ICD-10-CM

## 2017-07-08 DIAGNOSIS — G4733 Obstructive sleep apnea (adult) (pediatric): Secondary | ICD-10-CM | POA: Diagnosis not present

## 2017-07-08 DIAGNOSIS — I4891 Unspecified atrial fibrillation: Secondary | ICD-10-CM | POA: Diagnosis not present

## 2017-07-08 LAB — ECHOCARDIOGRAM COMPLETE
AVLVOTPG: 4 mmHg
EERAT: 12.8
FS: 26 % — AB (ref 28–44)
Height: 70.5 in
IVS/LV PW RATIO, ED: 1
LA ID, A-P, ES: 46 mm
LA diam end sys: 46 mm
LA diam index: 1.86 cm/m2
LA vol A4C: 91.3 ml
LA vol index: 42.8 mL/m2
LAVOL: 106 mL
LV E/e'average: 12.8
LV PW d: 18 mm — AB (ref 0.6–1.1)
LV TDI E'MEDIAL: 8.7
LVEEMED: 12.8
LVELAT: 10 cm/s
LVOT VTI: 22.3 cm
LVOT area: 3.46 cm2
LVOT diameter: 21 mm
LVOTPV: 103 cm/s
LVOTSV: 77 mL
MV Peak grad: 7 mmHg
MV pk E vel: 128 m/s
RV LATERAL S' VELOCITY: 10.4 cm/s
RV TAPSE: 20.3 mm
TDI e' lateral: 10
Weight: 4183.45 oz

## 2017-07-08 LAB — BLOOD CULTURE ID PANEL (REFLEXED)
Acinetobacter baumannii: NOT DETECTED
CANDIDA ALBICANS: NOT DETECTED
CANDIDA KRUSEI: NOT DETECTED
CANDIDA PARAPSILOSIS: NOT DETECTED
Candida glabrata: NOT DETECTED
Candida tropicalis: NOT DETECTED
Enterobacter cloacae complex: NOT DETECTED
Enterobacteriaceae species: NOT DETECTED
Enterococcus species: NOT DETECTED
Escherichia coli: NOT DETECTED
HAEMOPHILUS INFLUENZAE: NOT DETECTED
KLEBSIELLA OXYTOCA: NOT DETECTED
KLEBSIELLA PNEUMONIAE: NOT DETECTED
Listeria monocytogenes: NOT DETECTED
METHICILLIN RESISTANCE: DETECTED — AB
NEISSERIA MENINGITIDIS: NOT DETECTED
PROTEUS SPECIES: NOT DETECTED
PSEUDOMONAS AERUGINOSA: NOT DETECTED
SERRATIA MARCESCENS: NOT DETECTED
STREPTOCOCCUS PNEUMONIAE: NOT DETECTED
STREPTOCOCCUS PYOGENES: NOT DETECTED
Staphylococcus aureus (BCID): DETECTED — AB
Staphylococcus species: DETECTED — AB
Streptococcus agalactiae: NOT DETECTED
Streptococcus species: NOT DETECTED

## 2017-07-08 LAB — CBC
HEMATOCRIT: 23.5 % — AB (ref 39.0–52.0)
HEMOGLOBIN: 7.4 g/dL — AB (ref 13.0–17.0)
MCH: 29.2 pg (ref 26.0–34.0)
MCHC: 31.5 g/dL (ref 30.0–36.0)
MCV: 92.9 fL (ref 78.0–100.0)
Platelets: 254 10*3/uL (ref 150–400)
RBC: 2.53 MIL/uL — ABNORMAL LOW (ref 4.22–5.81)
RDW: 16.2 % — AB (ref 11.5–15.5)
WBC: 6.7 10*3/uL (ref 4.0–10.5)

## 2017-07-08 LAB — MRSA PCR SCREENING: MRSA BY PCR: POSITIVE — AB

## 2017-07-08 LAB — BASIC METABOLIC PANEL
ANION GAP: 10 (ref 5–15)
BUN: 33 mg/dL — AB (ref 6–20)
CALCIUM: 8.6 mg/dL — AB (ref 8.9–10.3)
CO2: 31 mmol/L (ref 22–32)
Chloride: 97 mmol/L — ABNORMAL LOW (ref 101–111)
Creatinine, Ser: 1.4 mg/dL — ABNORMAL HIGH (ref 0.61–1.24)
GFR calc Af Amer: 57 mL/min — ABNORMAL LOW (ref >60)
GFR calc non Af Amer: 49 mL/min — ABNORMAL LOW (ref >60)
GLUCOSE: 86 mg/dL (ref 65–99)
Potassium: 3.8 mmol/L (ref 3.5–5.1)
Sodium: 138 mmol/L (ref 135–145)

## 2017-07-08 LAB — IRON AND TIBC
Iron: 24 ug/dL — ABNORMAL LOW (ref 45–182)
SATURATION RATIOS: 13 % — AB (ref 17.9–39.5)
TIBC: 178 ug/dL — ABNORMAL LOW (ref 250–450)
UIBC: 154 ug/dL

## 2017-07-08 LAB — VITAMIN B12: Vitamin B-12: 293 pg/mL (ref 180–914)

## 2017-07-08 LAB — FOLATE: Folate: 16 ng/mL (ref >5.9)

## 2017-07-08 LAB — RETICULOCYTES
RBC.: 2.46 MIL/uL — AB (ref 4.22–5.81)
RETIC COUNT ABSOLUTE: 51.7 10*3/uL (ref 19.0–186.0)
RETIC CT PCT: 2.1 % (ref 0.4–3.1)

## 2017-07-08 LAB — PREPARE RBC (CROSSMATCH)

## 2017-07-08 LAB — FERRITIN: Ferritin: 316 ng/mL (ref 24–336)

## 2017-07-08 MED ORDER — SODIUM CHLORIDE 0.9 % IV SOLN
INTRAVENOUS | Status: DC
  Administered 2017-07-08 – 2017-07-09 (×2): via INTRAVENOUS

## 2017-07-08 MED ORDER — VANCOMYCIN HCL 10 G IV SOLR
1250.0000 mg | INTRAVENOUS | Status: DC
  Administered 2017-07-09 – 2017-07-12 (×4): 1250 mg via INTRAVENOUS
  Filled 2017-07-08 (×6): qty 1250

## 2017-07-08 MED ORDER — DAKINS (1/4 STRENGTH) 0.125 % EX SOLN
Freq: Every day | CUTANEOUS | Status: AC
  Administered 2017-07-08 – 2017-07-10 (×3)
  Filled 2017-07-08: qty 473

## 2017-07-08 MED ORDER — CHLORHEXIDINE GLUCONATE CLOTH 2 % EX PADS
6.0000 | MEDICATED_PAD | Freq: Every day | CUTANEOUS | Status: AC
  Administered 2017-07-11 – 2017-07-12 (×2): 6 via TOPICAL

## 2017-07-08 MED ORDER — VANCOMYCIN HCL 10 G IV SOLR
2000.0000 mg | Freq: Once | INTRAVENOUS | Status: AC
  Administered 2017-07-08: 2000 mg via INTRAVENOUS
  Filled 2017-07-08: qty 2000

## 2017-07-08 MED ORDER — MUPIROCIN 2 % EX OINT
1.0000 "application " | TOPICAL_OINTMENT | Freq: Two times a day (BID) | CUTANEOUS | Status: AC
  Administered 2017-07-08 – 2017-07-12 (×9): 1 via NASAL
  Filled 2017-07-08 (×2): qty 22

## 2017-07-08 MED ORDER — SODIUM CHLORIDE 0.9 % IV SOLN: Freq: Once | INTRAVENOUS | Status: DC

## 2017-07-08 MED ORDER — PERFLUTREN LIPID MICROSPHERE
1.0000 mL | INTRAVENOUS | Status: AC | PRN
  Administered 2017-07-08: 2 mL via INTRAVENOUS
  Filled 2017-07-08: qty 10

## 2017-07-08 NOTE — Progress Notes (Signed)
PHARMACY - PHYSICIAN COMMUNICATION CRITICAL VALUE ALERT - BLOOD CULTURE IDENTIFICATION (BCID)  Results for orders placed or performed during the hospital encounter of 07/07/17  Blood Culture ID Panel (Reflexed) (Collected: 07/07/2017  7:55 PM)  Result Value Ref Range   Enterococcus species NOT DETECTED NOT DETECTED   Listeria monocytogenes NOT DETECTED NOT DETECTED   Staphylococcus species DETECTED (A) NOT DETECTED   Staphylococcus aureus DETECTED (A) NOT DETECTED   Methicillin resistance DETECTED (A) NOT DETECTED   Streptococcus species NOT DETECTED NOT DETECTED   Streptococcus agalactiae NOT DETECTED NOT DETECTED   Streptococcus pneumoniae NOT DETECTED NOT DETECTED   Streptococcus pyogenes NOT DETECTED NOT DETECTED   Acinetobacter baumannii NOT DETECTED NOT DETECTED   Enterobacteriaceae species NOT DETECTED NOT DETECTED   Enterobacter cloacae complex NOT DETECTED NOT DETECTED   Escherichia coli NOT DETECTED NOT DETECTED   Klebsiella oxytoca NOT DETECTED NOT DETECTED   Klebsiella pneumoniae NOT DETECTED NOT DETECTED   Proteus species NOT DETECTED NOT DETECTED   Serratia marcescens NOT DETECTED NOT DETECTED   Haemophilus influenzae NOT DETECTED NOT DETECTED   Neisseria meningitidis NOT DETECTED NOT DETECTED   Pseudomonas aeruginosa NOT DETECTED NOT DETECTED   Candida albicans NOT DETECTED NOT DETECTED   Candida glabrata NOT DETECTED NOT DETECTED   Candida krusei NOT DETECTED NOT DETECTED   Candida parapsilosis NOT DETECTED NOT DETECTED   Candida tropicalis NOT DETECTED NOT DETECTED    Name of physician (or Provider) Contacted: [place] via text page, Jesus [name] seeing patient per auto-consult for Staph aureus bacteremia  Changes to prescribed antibiotics required: Vancomycin started by Dr. Daiva EvesVan [name]  Sallee Provencalurner, Jesus [name] 07/08/2017  3:16 PM

## 2017-07-08 NOTE — Progress Notes (Addendum)
[facility] Team 11 Progress Note  Subjective: got 1 unit prbc's and Hb is up to 7.4 today.  Patient w/o new c/o's, wants to go back to his SNF, "all my stuff is there".  FOB is negative.   Vitals:   07/08/17 0046 07/08/17 0135 07/08/17 0408 07/08/17 1000  BP: (!) 146/68 (!) 138/58 (!) 110/55 (!) 116/56  Pulse: 70 68 76 80  Resp: 16 19 19 20   Temp: 98.6 F (37 C) 98.6 F (37 C) 98.4 F (36.9 C) 98 F (36.7 C)  TempSrc:  Oral  Oral  SpO2: 96% 96% 95% 98%  Weight: 118.6 kg (261 lb 7.5 oz)     Height: 5' 10.5" (1.791 m)       Inpatient medications: . apixaban  5 mg Oral BID  . atorvastatin  80 mg Oral Daily  . carvedilol  12.5 mg Oral BID WC  . Chlorhexidine Gluconate Cloth  6 each Topical Q0600  . divalproex  250 mg Oral TID  . feeding supplement (PRO-STAT SUGAR FREE 64)  30 mL Oral Q6H  . furosemide  40 mg Oral BID  . loratadine  10 mg Oral Daily  . memantine  5 mg Oral BID  . multivitamin with minerals  1 tablet Oral Daily  . mupirocin ointment  1 application Nasal BID  . potassium chloride SA  20 mEq Oral BID  . predniSONE  5 mg Oral Q breakfast  . saccharomyces boulardii  250 mg Oral BID  . sodium hypochlorite   Irrigation Daily  . thiamine  100 mg Oral Daily  . vitamin A & D  1 application Topical BID   . sodium chloride     acetaminophen **OR** acetaminophen, HYDROcodone-acetaminophen, ondansetron **OR** ondansetron (ZOFRAN) IV  Exam: Obese, lying flat, debilitated but alert and mostly oriented, responds normally No jvd Chest clear bilat RRR no mrg Abd obese soft ntnd +BS Ext dry flaking skin both lower legs, no edema Patchy blistering rash over torso and proximal extremities Neuro gen'd weakness, nonfocal, Ox president and self, "2017" , not sure month or date   Recent Hx: Jan 2018 - admitted 20 days for aspiration PNA, developed large sacral decub required I&D, [facility], but after dc'd to [facility]F was shortly back in hospital > April 2018 -  sent from SNF for AMS and resp failure. Had pulm edema , low albumin/ cirrhosis by CT and diast CHF.  Rx bipap and IV lasix and improved.  Pt seen by palliative care and made DNR.  DC" to SNF on qhs bipap at SNF w/ DNR.  PO lasix.       Impression: 1. Anemia - prob due to chronic illness (#2). Stool FOB negative. Check anemia panel, give another 2u prbc's today.   2. 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 Was treated w/ prednisone and got better, then returned when pred was dc'd.  Here pt was admitted on pred 5/ d. They have plans at SNF to refer him to derm and rheum OP appts. Continue po pred 5/d.  Resume OP w/u when back to SNF.  3. Sacral decub - reportedly these have improved dramatically and are almost healed over 4. Chronic afib - on Eliquis 5. AKI - creat up 1.40, baseline 0.9. Looks a bit dry, will hold lasix and give some cautious IVFs 6.  Volume - no sig edema on exam.  Hx of low alb 2.0.   7. HTN- low dose coreg 12.5 bid  Plan - give 2 more units prbc's, reassess Hb in am, anemia labs, prob dc tomorrow   Vinson Moselleob Kemar Pandit MD [facility] pgr [phone] 07/09/2017, 7:37 PM    Recent Labs Lab 07/07/17 1955 07/08/17 0413  NA 136 138  K 4.0 3.8  CL 96* 97*  CO2 31 31  GLUCOSE 151* 86  BUN 33* 33*  CREATININE 1.41* 1.40*  CALCIUM 8.4* 8.6*    Recent Labs Lab 07/07/17 1955  AST 12*  ALT 7*  ALKPHOS 113  BILITOT 0.6  PROT 5.0*  ALBUMIN 2.0*    Recent Labs Lab 07/07/17 1955 07/08/17 0413  WBC 6.6 6.7  HGB 6.7* 7.4*  HCT 21.8* 23.5*  MCV 94.0 92.9  PLT 269 254   Iron/TIBC/Ferritin/ %Sat    Component Value Date/Time   IRON 43 (L) 01/02/2017 0559   TIBC 189 (L) 01/02/2017 0559   FERRITIN 756 (H) 01/02/2017 0559   IRONPCTSAT 23 01/02/2017 0559

## 2017-07-08 NOTE — ED Notes (Signed)
Attempted report x 1; name and call back number provided 

## 2017-07-08 NOTE — Progress Notes (Signed)
Pharmacy Antibiotic Note  Jesus [name] is a 71 y.o. male admitted on 07/07/2017 with bacteremia.  Pharmacy has been consulted for vancomycin dosing. He had MRSA isolated from one blood culture and is to begin vancomycin. Note his creatinine is elevated from his baseline.  Plan: Vancomycin 2000mg  load, then 1250mg  IV q24h Watch renal function closely - BMET in AM Follow final sensitivities on cultures  Height: 5' 10.5" (179.1 cm) Weight: 261 lb 7.5 oz (118.6 kg) IBW/kg (Calculated) : 74.15  Temp (24hrs), Avg:98.6 F (37 C), Min:98 F (36.7 C), Max:99.4 F (37.4 C)   Recent Labs Lab 07/07/17 1955 07/08/17 0413  WBC 6.6 6.7  CREATININE 1.41* 1.40*    Estimated Creatinine Clearance: 63 mL/min (A) (by C-G formula based on SCr of 1.4 mg/dL (H)).    Allergies  Allergen Reactions  . Amlodipine Besylate Swelling and Other (See Comments)    "started off low and ended up severe; if, in fact, that is what's causing the swelling" (06/03/12)  . Levaquin [Levofloxacin In D5w] Rash    Antimicrobials this admission: vanc 8/1 >>    Thank you for allowing pharmacy to be a part of this patient's care.  Sallee Provencalurner, Aime Carreras S 07/08/2017 4:01 PM

## 2017-07-08 NOTE — Progress Notes (Signed)
Orthopedic Tech Progress Note Patient Details:  Jesus [name] [birth year] [id number]  Ortho Devices Type of Ortho Device: Roland RackUnna boot Ortho Device/Splint Location: bilateral Ortho Device/Splint Interventions: Application   [name] 07/08/2017, 2:10 PM

## 2017-07-08 NOTE — Consult Note (Signed)
[facility]    Date of Admission:  07/07/2017   Total days of antibiotics 1        Day 1 of vancomycin               Reason for Consult: Automatic consult for MRSA bacteremia    Referring Provider:  Primary Care Provider: Gerre PebblesSally [name], PA  Assessment:  MRSA Bacteremia The patient was found to have methicillin resistant staph aureus bacteremia via biofire on 07/08/17. IV vancomycin was started per pharmacy dosing. The likely source of the infection is the skin secondary to his bullous pemphigoid blisters bursting. His decubitus ulcer on his sacrum can also be a potential source. The patient does not have any cvc or picc lines presently per examination. No known recent procedures.   Plan: 1. Treat for MRSA Bacteremia with IV vancomycin 2. TTE to assess for possible cardiac seeding 3. Follow Blood culture 4. Requested records from snf    . apixaban  5 mg Oral BID  . atorvastatin  80 mg Oral Daily  . carvedilol  12.5 mg Oral BID WC  . Chlorhexidine Gluconate Cloth  6 each Topical Q0600  . divalproex  250 mg Oral TID  . feeding supplement (PRO-STAT SUGAR FREE 64)  30 mL Oral Q6H  . loratadine  10 mg Oral Daily  . memantine  5 mg Oral BID  . multivitamin with minerals  1 tablet Oral Daily  . mupirocin ointment  1 application Nasal BID  . potassium chloride SA  20 mEq Oral BID  . predniSONE  5 mg Oral Q breakfast  . sodium hypochlorite   Irrigation Daily  . thiamine  100 mg Oral Daily  . vitamin A & D  1 application Topical BID    HPI: Jesus MooresDouglas M Reichardt is a 71 y.o. male with pmh of chronic diastolic chf, chronic atrial fibrillation, chronic anemia, multiple wounds on the skin, and liver cirrhosis presented from [facility] with low hemoglobin level of 6.7. The patient had altered mental status and was not able to give a good history. Majority of the history was gathered by talking to patient's daughter via phone call and chart  review. According to the patient's daughter the patient has been developing some blisters over the past 2-3 months that resemble bullous pemphigoid and was treated with prednisone. However, the blisters returned when the prednisone was discontinued. The patient also had a MRSA infection while at the snf, but it is unsure how long ago that was and whether he was treated adequately for it. Will request patient's records to be sent from snf.   Review of Systems: ROS limited due to patient's altered status  Past Medical History:  Diagnosis Date  . Acute encephalopathy   . Acute on chronic renal failure (HCC)   . Acute pulmonary edema (HCC)   . Acute respiratory failure with hypoxia and hypercapnia (HCC) 02/2017  . Arthritis    "normal for my age" (11/13/2016)  . Atrial fibrillation (HCC)   . Cellulitis and abscess of leg 11/2016  . Chronic anemia   . Chronic atrial fibrillation (HCC)   . Chronic diastolic CHF (congestive heart failure) (HCC) 12/2002   a. EF 50-55% by echo 04/2012  . Decubitus ulcer of buttock, unstageable (HCC) 12/31/2016  . Edema of lower extremity   . Goals of care, counseling/discussion   . Gout   . H/O: GI bleed    "  cause I took too much aspirin"  . High cholesterol   . Hyperglycemia    Noted 05/2012  . Hypertension   . Hypoxemia   . Morbid obesity (HCC)   . OSA on CPAP   . Palliative care encounter   . Peripheral edema   . PVD (peripheral vascular disease) (HCC)   . Sepsis secondary to UTI (HCC)   . Venous stasis ulcer (HCC)     Social History  Substance Use Topics  . Smoking status: Former Smoker    Types: Cigarettes    Quit date: 12/08/1965  . Smokeless tobacco: Never Used     Comment: "social cigarette smoker in my late teens"  . Alcohol use 8.4 oz/week    14 Cans of beer per week    Family History  Problem Relation Age of Onset  . Arrhythmia Father   . Hypertension Father   . Cancer Mother        cervical   Allergies  Allergen Reactions  .  Amlodipine Besylate Swelling and Other (See Comments)    "started off low and ended up severe; if, in fact, that is what's causing the swelling" (06/03/12)  . Levaquin [Levofloxacin In D5w] Rash    OBJECTIVE: Blood pressure 122/60, pulse 60, temperature 98.3 F (36.8 C), temperature source Oral, resp. rate (!) 24, height 5' 10.5" (1.791 m), weight 118.6 kg (261 lb 7.5 oz), SpO2 96 %.  Physical Exam  Constitutional: He appears well-developed and well-nourished. He appears distressed.  HENT:  Head: Normocephalic and atraumatic.  Eyes: Conjunctivae are normal.  Musculoskeletal: Normal range of motion.  Skin: Abrasion noted. There is erythema.  The patient has blisters over bilateral arms and upper trunk. There is some skin breakdown and bleeding noted over upper extremity.  Psychiatric: His mood appears anxious. He is agitated and aggressive.   Physical Exam  Constitutional: He appears well-developed and well-nourished. He appears distressed.  HENT:  Head: Normocephalic and atraumatic.  Eyes: Conjunctivae are normal.  Musculoskeletal: Normal range of motion.  Skin: Abrasion noted. There is erythema.  The patient has blisters over bilateral arms and upper trunk. There is some skin breakdown and bleeding noted over upper extremity.  Psychiatric: His mood appears anxious. He is agitated and aggressive.      Lab Results Lab Results  Component Value Date   WBC 6.7 07/08/2017   HGB 7.4 (L) 07/08/2017   HCT 23.5 (L) 07/08/2017   MCV 92.9 07/08/2017   PLT 254 07/08/2017    Lab Results  Component Value Date   CREATININE 1.40 (H) 07/08/2017   BUN 33 (H) 07/08/2017   NA 138 07/08/2017   K 3.8 07/08/2017   CL 97 (L) 07/08/2017   CO2 31 07/08/2017    Lab Results  Component Value Date   ALT 7 (L) 07/07/2017   AST 12 (L) 07/07/2017   ALKPHOS 113 07/07/2017   BILITOT 0.6 07/07/2017     Microbiology: Recent Results (from the past 240 hour(s))  Blood culture (routine x 2)      Status: None (Preliminary result)   Collection Time: 07/07/17  7:55 PM  Result Value Ref Range Status   Specimen Description BLOOD RIGHT HAND  Final   Special Requests   Final    BOTTLES DRAWN AEROBIC AND ANAEROBIC Blood Culture adequate volume   Culture  Setup Time   Final    GRAM POSITIVE COCCI IN CLUSTERS AEROBIC BOTTLE ONLY Organism ID to follow CRITICAL RESULT CALLED TO, READ BACK BY  AND VERIFIED WITH: Artelia LarocheM. Turner Pharm.D. 14:55 07/08/17 ([email or web address])    Culture GRAM POSITIVE COCCI  Final   Report Status PENDING  Incomplete  Blood Culture ID Panel (Reflexed)     Status: Abnormal   Collection Time: 07/07/17  7:55 PM  Result Value Ref Range Status   Enterococcus species NOT DETECTED NOT DETECTED Final   Listeria monocytogenes NOT DETECTED NOT DETECTED Final   Staphylococcus species DETECTED (A) NOT DETECTED Final    Comment: CRITICAL RESULT CALLED TO, READ BACK BY AND VERIFIED WITH: M. Loretha Brasilurner Pharm.D. 14:55 07/08/17 ([email or web address])    Staphylococcus aureus DETECTED (A) NOT DETECTED Final    Comment: Methicillin (oxacillin)-resistant Staphylococcus aureus (MRSA). MRSA is predictably resistant to beta-lactam antibiotics (except ceftaroline). Preferred therapy is vancomycin unless clinically contraindicated. Patient requires contact precautions if  hospitalized. CRITICAL RESULT CALLED TO, READ BACK BY AND VERIFIED WITH: M. Mayford Knifeurner Pharm.D. 14:55 07/08/17 ([email or web address])    Methicillin resistance DETECTED (A) NOT DETECTED Final    Comment: CRITICAL RESULT CALLED TO, READ BACK BY AND VERIFIED WITH: M. Mayford Knifeurner Pharm.D. 14:55 07/08/17 ([email or web address])    Streptococcus species NOT DETECTED NOT DETECTED Final   Streptococcus agalactiae NOT DETECTED NOT DETECTED Final   Streptococcus pneumoniae NOT DETECTED NOT DETECTED Final   Streptococcus pyogenes NOT DETECTED NOT DETECTED Final   Acinetobacter baumannii NOT DETECTED NOT DETECTED Final   Enterobacteriaceae species NOT DETECTED NOT DETECTED Final   Enterobacter  cloacae complex NOT DETECTED NOT DETECTED Final   Escherichia coli NOT DETECTED NOT DETECTED Final   Klebsiella oxytoca NOT DETECTED NOT DETECTED Final   Klebsiella pneumoniae NOT DETECTED NOT DETECTED Final   Proteus species NOT DETECTED NOT DETECTED Final   Serratia marcescens NOT DETECTED NOT DETECTED Final   Haemophilus influenzae NOT DETECTED NOT DETECTED Final   Neisseria meningitidis NOT DETECTED NOT DETECTED Final   Pseudomonas aeruginosa NOT DETECTED NOT DETECTED Final   Candida albicans NOT DETECTED NOT DETECTED Final   Candida glabrata NOT DETECTED NOT DETECTED Final   Candida krusei NOT DETECTED NOT DETECTED Final   Candida parapsilosis NOT DETECTED NOT DETECTED Final   Candida tropicalis NOT DETECTED NOT DETECTED Final  Blood culture (routine x 2)     Status: None (Preliminary result)   Collection Time: 07/07/17  8:42 PM  Result Value Ref Range Status   Specimen Description BLOOD RIGHT ANTECUBITAL  Final   Special Requests   Final    BOTTLES DRAWN AEROBIC AND ANAEROBIC Blood Culture adequate volume   Culture NO GROWTH < 24 HOURS  Final   Report Status PENDING  Incomplete  MRSA PCR Screening     Status: Abnormal   Collection Time: 07/08/17 12:45 AM  Result Value Ref Range Status   MRSA by PCR POSITIVE (A) NEGATIVE Final    Comment:        The GeneXpert MRSA Assay (FDA approved for NASAL specimens only), is one component of a comprehensive MRSA colonization surveillance program. It is not intended to diagnose MRSA infection nor to guide or monitor treatment for MRSA infections. RESULT CALLED TO, READ BACK BY AND VERIFIED WITHBarbaraann Cao: E CASTRO RN [phone] 07/08/17 A BROWNING     Lorenso CourierVahini Ladena Jacquez, MD [facility] [phone] pager   [phone] cell 07/08/2017, 4:31 PM

## 2017-07-08 NOTE — Progress Notes (Signed)
Report received from ER RN . Room ready.  

## 2017-07-08 NOTE — Progress Notes (Signed)
New Admission Note:  Arrival Method: Stretcher  Mental Orientation: Alert and oriented x 3-4. Holler and and off " help me.!" Telemetry: N/A Assessment: Completed Skin: Fluid filled blisters all over. Attempted to remove dressing to legs. Patient refused secondary to pain. Foam dressing to buttocks x2. Unable to assess.  IV: NSL  Rt AC Pain: 8/10 generalized  Tubes: Chronic Foley  Safety Measures: Safety Fall Prevention Plan was given, discussed and signed. Admission: Completed [address] Orientation: Patient has been orientated to the room, unit and the staff. Family: None  Orders have been reviewed and implemented. Will continue to monitor the patient. Call light has been placed within reach and bed alarm has been activated.   [place] ShiEmmanuel Jaelie Aguilera BSN, RN  Phone Number: [phone]

## 2017-07-08 NOTE — Progress Notes (Signed)
  Echocardiogram 2D Echocardiogram has been performed.  Jesus [name] 07/08/2017, 5:25 PM

## 2017-07-08 NOTE — Consult Note (Signed)
WOC Nurse wound consult note Reason for Consult: blisters all over Wound type: Patient known to this WOC nurse from his previous admissions. He has a hx. Sacral pressure injury and LE wounds.  Today he has a new rash over his arms, thighs, trunk, hands that has some intact blisters and some ruptured areas which is not documented in the last note from the nursing home provider on 04/17/17. Was on clindamycin for MRSA bacteremia at that time. It should be noted that patient reports great amount of pain with almost any change in position and or turning patient.  I am unable to extend the RLE, and the patient reports he can not move the leg. When I try to extend the leg he yells out in pain.  Patient appears to be bedbound.   1. Sacral Stage 4 Pressure Injury: 6cm x 4cm x 5cm with palpable bone, ruddy, but clean, 100% pink, with moderate drainage, yellow with odor  2. Multiple blisters and skin lesions over upper body, hands, arms, thighs.   3. Venous stasis ulcerations bilateral LE: left lateral-4cm x 2cm x 0.1cm/ right posterior calf: 4cm x 3cm x 0.1cm-both clean and healing well. NOTE: they bleed constantly with removal of dressings, especially the right posterior calf wounds.  Pressure Injury POA: Yes Measurement: see above. Wound bed: see above  Drainage (amount, consistency, odor) see above Periwound: no s/s of infection Dressing procedure/placement/frequency: 1. Add air mattress for pressure redistribution and moisture management 2. Foam and Unna's boots to the bilateral LEs, that is what he came in with and seems to be working well to control his ulcers and venous dx. 3. Dakins solution for the sacrum short term, due to strong odor with drainage.  OK to switch back to saline once drainage with less odor 4. Skin condition is really outside the scope of practice for WOC nurse, ? SJ syndrome. Unclear if recent onset, patient not able to tell me. Due to the extent of the area affected do not  think lots of topical covers would be affective at this time.  If any of the blisters rupture and wounds appear to be infected would use xeroform as antibacterial nonadherent. If dermatology needed would need higher level of care with access to those services, also if suspect for SJS would need center that treats this type disease   Discussed POC with patient and bedside nurse.  Re consult if needed, will not follow at this time. Thanks  Gatlin Kittell M.D.C. [employer] MSN, RN,CWOCN, CNS, CWON-AP [phone])

## 2017-07-09 DIAGNOSIS — A412 Sepsis due to unspecified staphylococcus: Secondary | ICD-10-CM | POA: Diagnosis not present

## 2017-07-09 DIAGNOSIS — D649 Anemia, unspecified: Secondary | ICD-10-CM | POA: Diagnosis present

## 2017-07-09 DIAGNOSIS — I5032 Chronic diastolic (congestive) heart failure: Secondary | ICD-10-CM | POA: Diagnosis present

## 2017-07-09 DIAGNOSIS — K7460 Unspecified cirrhosis of liver: Secondary | ICD-10-CM | POA: Diagnosis present

## 2017-07-09 DIAGNOSIS — I110 Hypertensive heart disease with heart failure: Secondary | ICD-10-CM | POA: Diagnosis present

## 2017-07-09 DIAGNOSIS — Z7901 Long term (current) use of anticoagulants: Secondary | ICD-10-CM | POA: Diagnosis not present

## 2017-07-09 DIAGNOSIS — F0390 Unspecified dementia without behavioral disturbance: Secondary | ICD-10-CM | POA: Diagnosis present

## 2017-07-09 DIAGNOSIS — D638 Anemia in other chronic diseases classified elsewhere: Secondary | ICD-10-CM | POA: Diagnosis present

## 2017-07-09 DIAGNOSIS — G9340 Encephalopathy, unspecified: Secondary | ICD-10-CM | POA: Diagnosis present

## 2017-07-09 DIAGNOSIS — L89154 Pressure ulcer of sacral region, stage 4: Secondary | ICD-10-CM | POA: Diagnosis present

## 2017-07-09 DIAGNOSIS — I482 Chronic atrial fibrillation: Secondary | ICD-10-CM | POA: Diagnosis present

## 2017-07-09 DIAGNOSIS — Z7189 Other specified counseling: Secondary | ICD-10-CM | POA: Diagnosis not present

## 2017-07-09 DIAGNOSIS — I4891 Unspecified atrial fibrillation: Secondary | ICD-10-CM | POA: Diagnosis not present

## 2017-07-09 DIAGNOSIS — Z66 Do not resuscitate: Secondary | ICD-10-CM | POA: Diagnosis present

## 2017-07-09 DIAGNOSIS — Z515 Encounter for palliative care: Secondary | ICD-10-CM

## 2017-07-09 DIAGNOSIS — R7881 Bacteremia: Secondary | ICD-10-CM | POA: Diagnosis not present

## 2017-07-09 DIAGNOSIS — Z79899 Other long term (current) drug therapy: Secondary | ICD-10-CM | POA: Diagnosis not present

## 2017-07-09 DIAGNOSIS — R0603 Acute respiratory distress: Secondary | ICD-10-CM | POA: Diagnosis not present

## 2017-07-09 DIAGNOSIS — G4733 Obstructive sleep apnea (adult) (pediatric): Secondary | ICD-10-CM | POA: Diagnosis present

## 2017-07-09 DIAGNOSIS — A4102 Sepsis due to Methicillin resistant Staphylococcus aureus: Secondary | ICD-10-CM | POA: Diagnosis present

## 2017-07-09 DIAGNOSIS — L89150 Pressure ulcer of sacral region, unstageable: Secondary | ICD-10-CM | POA: Diagnosis not present

## 2017-07-09 DIAGNOSIS — Z881 Allergy status to other antibiotic agents status: Secondary | ICD-10-CM | POA: Diagnosis not present

## 2017-07-09 DIAGNOSIS — Z888 Allergy status to other drugs, medicaments and biological substances status: Secondary | ICD-10-CM | POA: Diagnosis not present

## 2017-07-09 DIAGNOSIS — L120 Bullous pemphigoid: Secondary | ICD-10-CM | POA: Diagnosis present

## 2017-07-09 DIAGNOSIS — Z6836 Body mass index (BMI) 36.0-36.9, adult: Secondary | ICD-10-CM | POA: Diagnosis not present

## 2017-07-09 DIAGNOSIS — I739 Peripheral vascular disease, unspecified: Secondary | ICD-10-CM | POA: Diagnosis present

## 2017-07-09 DIAGNOSIS — Z87891 Personal history of nicotine dependence: Secondary | ICD-10-CM | POA: Diagnosis not present

## 2017-07-09 DIAGNOSIS — I10 Essential (primary) hypertension: Secondary | ICD-10-CM | POA: Diagnosis not present

## 2017-07-09 DIAGNOSIS — E7800 Pure hypercholesterolemia, unspecified: Secondary | ICD-10-CM | POA: Diagnosis present

## 2017-07-09 DIAGNOSIS — N179 Acute kidney failure, unspecified: Secondary | ICD-10-CM | POA: Diagnosis present

## 2017-07-09 DIAGNOSIS — Z9989 Dependence on other enabling machines and devices: Secondary | ICD-10-CM | POA: Diagnosis not present

## 2017-07-09 LAB — BPAM RBC
BLOOD PRODUCT EXPIRATION DATE: 201808252359
Blood Product Expiration Date: 201808252359
Blood Product Expiration Date: 201808252359
ISSUE DATE / TIME: 201808011423
ISSUE DATE / TIME: 201808011742
ISSUE DATE / TIME: 201807312220
UNIT TYPE AND RH: 6200
Unit Type and Rh: 6200
Unit Type and Rh: 6200

## 2017-07-09 LAB — TYPE AND SCREEN
ABO/RH(D): A POS
Antibody Screen: NEGATIVE
UNIT DIVISION: 0
Unit division: 0
Unit division: 0

## 2017-07-09 LAB — CBC
HEMATOCRIT: 26.7 % — AB (ref 39.0–52.0)
HEMOGLOBIN: 8.5 g/dL — AB (ref 13.0–17.0)
MCH: 29.6 pg (ref 26.0–34.0)
MCHC: 31.8 g/dL (ref 30.0–36.0)
MCV: 93 fL (ref 78.0–100.0)
Platelets: 223 10*3/uL (ref 150–400)
RBC: 2.87 MIL/uL — AB (ref 4.22–5.81)
RDW: 16.6 % — ABNORMAL HIGH (ref 11.5–15.5)
WBC: 8.4 10*3/uL (ref 4.0–10.5)

## 2017-07-09 LAB — BASIC METABOLIC PANEL
ANION GAP: 10 (ref 5–15)
BUN: 26 mg/dL — ABNORMAL HIGH (ref 6–20)
CALCIUM: 8.6 mg/dL — AB (ref 8.9–10.3)
CO2: 30 mmol/L (ref 22–32)
Chloride: 98 mmol/L — ABNORMAL LOW (ref 101–111)
Creatinine, Ser: 1.21 mg/dL (ref 0.61–1.24)
GFR, EST NON AFRICAN AMERICAN: 58 mL/min — AB (ref >60)
Glucose, Bld: 116 mg/dL — ABNORMAL HIGH (ref 65–99)
POTASSIUM: 3.9 mmol/L (ref 3.5–5.1)
Sodium: 138 mmol/L (ref 135–145)

## 2017-07-09 MED ORDER — HYDROMORPHONE HCL 1 MG/ML IJ SOLN
2.0000 mg | Freq: Once | INTRAMUSCULAR | Status: AC
  Administered 2017-07-09: 2 mg via INTRAVENOUS
  Filled 2017-07-09: qty 2

## 2017-07-09 MED ORDER — ALPRAZOLAM 0.25 MG PO TABS
0.2500 mg | ORAL_TABLET | Freq: Three times a day (TID) | ORAL | Status: DC | PRN
  Administered 2017-07-10 (×2): 0.5 mg via ORAL
  Filled 2017-07-09 (×2): qty 2

## 2017-07-09 MED ORDER — OLANZAPINE 5 MG PO TBDP
5.0000 mg | ORAL_TABLET | Freq: Every day | ORAL | Status: DC
  Administered 2017-07-09 – 2017-07-13 (×5): 5 mg via ORAL
  Filled 2017-07-09 (×6): qty 1

## 2017-07-09 MED ORDER — DIPHENHYDRAMINE HCL 50 MG/ML IJ SOLN
25.0000 mg | Freq: Four times a day (QID) | INTRAMUSCULAR | Status: DC | PRN
  Administered 2017-07-09 – 2017-07-10 (×2): 50 mg via INTRAVENOUS
  Administered 2017-07-13: 25 mg via INTRAVENOUS
  Filled 2017-07-09 (×4): qty 1

## 2017-07-09 MED ORDER — ALPRAZOLAM 0.25 MG PO TABS: 0.2500 mg | ORAL_TABLET | Freq: Two times a day (BID) | ORAL | Status: DC | PRN

## 2017-07-09 NOTE — Progress Notes (Signed)
Patient refuses CPAP for the night. States he does not wear one at home. RT will continue to monitor.

## 2017-07-09 NOTE — Consult Note (Signed)
Consultation Note Date: 07/09/2017   Patient Name: Jesus [name]  DOB: 01/24/1946  MRN: [id number]  Age / Sex: 71 y.o., male  PCP: Adron Bene, [name] Referring Physician: Roney Jaffe, MD  Reason for Consultation: Establishing goals of care  HPI/Patient Profile: 71 y.o. male  with past medical history of dementia, OSA, HTN, CHF, A. Fib, hepatic cirrhosis, chronic anemia, non healing decubitis ulcers requiring surgical debridement, chronic venous insufficieny, chronic edema, lesions in liver by CT (thought to be cyst vs pseudocyst) admitted on 07/07/2017 with low hemoglobin and multiple blistering wounds over his body. Workup reveals blood cultures positive for MRSA. Patient has history of multiple admissions (3 in the last six months- last in March for AMS- hypoxia and hypercarbia, CHF exacerbation, sepsis) and poor functional status and medical noncompliance. Palliative medicine consulted for [facility].    Clinical Assessment and Goals of Care: Met with patient's three children and sister in conjunction with Dr. Jonnie Finner. Discussed patient's multiple complicated medical problems providing for likely poor prognosis.  Patient has MRSA + bacteremia which will require several weeks of IV antibiotics and PICC line placement in a dementia patient who has until last year refused most medical care. Additionally, he has skin condition which only comfort is brought by steroids.  Family reports that Sacral Stage 4 decubitis ulcer has healed, however WOC note from 8/1 indicates open wound with palpable bone. He has history of liver cirrhosis- unknown how advanced- but has been on lactulose in the past per [facility] note.  Discussed continued options for aggressive medical care (tfx to [facility] for skin biopsy vs local care with PICC line IV antibiotics  For several weeks, prednisone, d/c to SNF vs comfort care and  transition to Hospice). Family notes patient has been miserable for over a year. They report he says he "just wants to be normal". Unfortunately, given his poor functional (ALBUMIN 2.0), nutritional, and cognitive state it is unlikely patient will make meaningful functional recovery.   Primary Decision Maker NEXT OF KIN - patient's children and sister    SUMMARY OF RECOMMENDATIONS -Family to call today with decisions for patient's care -Xanex .2m TID for agitation -PT/INR, Ammonia for liver disease staging and check for possibility of hepatic encephalopathy given patient's agitated state and history of cirrhosis- also to help guide in family decision making    Code Status/Advance Care Planning:  DNR  Palliative Prophylaxis:   Frequent Pain Assessment  Prognosis:    < 6 months due to poor functional, cognitive and nutritional status, evidence of significant decline in the last several months  Discharge Planning: To Be Determined  Primary Diagnoses: Present on Admission: . Normochromic normocytic anemia . Essential hypertension . Peripheral vascular disease ([place] . Decubitus ulcer of sacral region, unstageable ([facility] . Chronic diastolic CHF (congestive heart failure) ([facility] . Atrial fibrillation ([place] . Anemia   I have reviewed the medical record, interviewed the patient and family, and examined the patient. The following aspects are pertinent.  Past Medical History:  Diagnosis Date  .  Acute encephalopathy   . Acute on chronic renal failure ([place])   . Acute pulmonary edema (HCC)   . Acute respiratory failure with hypoxia and hypercapnia ([place]) 02/2017  . Arthritis    "normal for my age" (11/13/2016)  . Atrial fibrillation ([place])   . Cellulitis and abscess of leg 11/2016  . Chronic anemia   . Chronic atrial fibrillation ([place])   . Chronic diastolic CHF (congestive heart failure) ([place]) 12/2002   a. EF 50-55% by echo 04/2012  . Decubitus ulcer of buttock, unstageable ([place])  12/31/2016  . Edema of lower extremity   . Goals of care, counseling/discussion   . Gout   . H/O: GI bleed    "cause I took too much aspirin"  . High cholesterol   . Hyperglycemia    Noted 05/2012  . Hypertension   . Hypoxemia   . Morbid obesity ([place])   . OSA on CPAP   . Palliative care encounter   . Peripheral edema   . PVD (peripheral vascular disease) ([place])   . Sepsis secondary to UTI ([place])   . Venous stasis ulcer ([place])    Social History   Social History  . Marital status: Divorced    Spouse name: N/A  . Number of children: N/A  . Years of education: N/A   Occupational History  . Retired    Social History Main Topics  . Smoking status: Former Smoker    Types: Cigarettes    Quit date: 12/08/1965  . Smokeless tobacco: Never Used     Comment: "social cigarette smoker in my late teens"  . Alcohol use 8.4 oz/week    14 Cans of beer per week  . Drug use: No  . Sexual activity: No   Other Topics Concern  . None   Social History Narrative   Patient lives alone.   Family History  Problem Relation Age of Onset  . Arrhythmia Father   . Hypertension Father   . Cancer Mother        cervical   Scheduled Meds: . apixaban  5 mg Oral BID  . atorvastatin  80 mg Oral Daily  . carvedilol  12.5 mg Oral BID WC  . Chlorhexidine Gluconate Cloth  6 each Topical Q0600  . divalproex  250 mg Oral TID  . feeding supplement (PRO-STAT SUGAR FREE 64)  30 mL Oral Q6H  . loratadine  10 mg Oral Daily  . memantine  5 mg Oral BID  . multivitamin with minerals  1 tablet Oral Daily  . mupirocin ointment  1 application Nasal BID  . OLANZapine zydis  5 mg Oral QHS  . potassium chloride SA  20 mEq Oral BID  . predniSONE  5 mg Oral Q breakfast  . sodium hypochlorite   Irrigation Daily  . thiamine  100 mg Oral Daily  . vitamin A & D  1 application Topical BID   Continuous Infusions: . sodium chloride 65 mL/hr at 07/09/17 1343  . vancomycin     PRN Meds:.acetaminophen **OR**  acetaminophen, ALPRAZolam, HYDROcodone-acetaminophen, ondansetron **OR** ondansetron (ZOFRAN) IV Medications Prior to Admission:  Prior to Admission medications   Medication Sig Start Date End Date Taking? Authorizing Provider  acetaminophen (TYLENOL) 500 MG tablet Take 500 mg by mouth every 8 (eight) hours as needed for moderate pain (Pain score 4-6/10).   Yes [provider]  Amino Acids-Protein Hydrolys (FEEDING SUPPLEMENT, PRO-STAT SUGAR FREE 64,) LIQD Take 30 mLs by mouth every 6 (six) hours.  Yes [provider]  apixaban (ELIQUIS) 5 MG TABS tablet Take 1 tablet (5 mg total) by mouth 2 (two) times daily. 07/18/15  Yes Ghimire, Henreitta Leber, MD  atorvastatin (LIPITOR) 80 MG tablet Take 1 tablet (80 mg total) by mouth daily. 07/18/15  Yes Ghimire, Henreitta Leber, MD  carvedilol (COREG) 12.5 MG tablet Take 12.5 mg by mouth 2 (two) times daily with a meal.   Yes [provider]  divalproex (DEPAKOTE) 250 MG DR tablet Take 250 mg by mouth 3 (three) times daily.   Yes [provider]  furosemide (LASIX) 40 MG tablet Take 1 tablet (40 mg total) by mouth 2 (two) times daily. 03/17/17  Yes Domenic Polite, MD  HYDROcodone-acetaminophen (NORCO/VICODIN) 5-325 MG tablet Take 1-2 tablets by mouth every 6 (six) hours as needed for moderate pain. 03/17/17  Yes Domenic Polite, MD  loratadine (CLARITIN) 10 MG tablet Take 10 mg by mouth daily.   Yes [provider]  memantine (NAMENDA) 5 MG tablet Take 5 mg by mouth 2 (two) times daily.   Yes [provider]  Multiple Vitamin (MULTIVITAMIN WITH MINERALS) TABS tablet Take 1 tablet by mouth daily.   Yes [provider]  potassium chloride SA (K-DUR,KLOR-CON) 20 MEQ tablet Take 20 mEq by mouth 2 (two) times daily.   Yes [provider]  predniSONE (DELTASONE) 5 MG tablet Take 5 mg by mouth daily with breakfast.   Yes [provider]  saccharomyces boulardii (FLORASTOR) 250 MG capsule Take 250  mg by mouth 2 (two) times daily.  12/18/16  Yes [provider]  thiamine 100 MG tablet Take 1 tablet (100 mg total) by mouth daily. 01/15/17  Yes Bonnielee Haff, MD  Vitamins A & D (VITAMIN A & D) ointment Apply 1 application topically 2 (two) times daily.   Yes [provider]  pantoprazole (PROTONIX) 40 MG tablet Take 1 tablet (40 mg total) by mouth at bedtime. Patient not taking: Reported on 07/07/2017 03/17/17   Domenic Polite, MD   Allergies  Allergen Reactions  . Amlodipine Besylate Swelling and Other (See Comments)    "started off low and ended up severe; if, in fact, that is what's causing the swelling" (06/03/12)  . Levaquin [Levofloxacin In D5w] Rash   Review of Systems  Physical Exam  Vital Signs: BP 100/71 (BP Location: Right Wrist)   Pulse 61   Temp 98.5 F (36.9 C) (Oral)   Resp 17   Ht 5' 10.5" (1.791 m)   Wt 118 kg (260 lb 2.3 oz)   SpO2 99%   BMI 36.80 kg/m  Pain Assessment: 0-10   Pain Score: Asleep   SpO2: SpO2: 99 % O2 Device:SpO2: 99 % O2 Flow Rate: .   IO: Intake/output summary:  Intake/Output Summary (Last 24 hours) at 07/09/17 1711 Last data filed at 07/09/17 1446  Gross per 24 hour  Intake             2365 ml  Output             3800 ml  Net            -1435 ml    LBM: Last BM Date: 07/08/17 Baseline Weight: Weight: 118.6 kg (261 lb 7.5 oz) Most recent weight: Weight: 118 kg (260 lb 2.3 oz)     Palliative Assessment/Data: PPS: 20 %     Thank you for this consult. Palliative medicine will continue to follow and assist as needed.   Time In:1630  Time Out: 1800 Time Total: 90 minutes Greater than 50%  of this time was spent counseling and coordinating care related to the above assessment and plan.  Signed by: Mariana Kaufman, AGNP-C Palliative Medicine    Please contact Palliative Medicine Team phone at [phone] for questions and concerns.  For individual provider: See Shea Evans

## 2017-07-09 NOTE — Progress Notes (Signed)
[facility]  Date of Admission:  07/07/2017   Total days of antibiotics 2        Day 2 Vancomycin-started 07/08/17          ASSESSMENT AND PLAN  MRSA Bacteremia PCR assay (biofire) shows positivity for the meca gene. Blood culture results on 7/31 are continuing to show no growth. MRSA by nasal specimen positive We will treat for MRSA positive based on the pcr results. -continue iv vancomycin   -Patient's has good kidney function with cr=1.21 (8/2) down from previous 1.4 (8/1) -blood culture still negative 8/2 -TTE showed no presence of vegetations but thee was calcification of the mitral annulus -[facility] has been treated for MRSA bacteremia with clindamycin 450mg  tid from 04/09/17-05/09/17.  Bullous Pemphigoid? -consider consult dermatology     . apixaban  5 mg Oral BID  . atorvastatin  80 mg Oral Daily  . carvedilol  12.5 mg Oral BID WC  . Chlorhexidine Gluconate Cloth  6 each Topical Q0600  . divalproex  250 mg Oral TID  . feeding supplement (PRO-STAT SUGAR FREE 64)  30 mL Oral Q6H  . loratadine  10 mg Oral Daily  . memantine  5 mg Oral BID  . multivitamin with minerals  1 tablet Oral Daily  . mupirocin ointment  1 application Nasal BID  . potassium chloride SA  20 mEq Oral BID  . predniSONE  5 mg Oral Q breakfast  . sodium hypochlorite   Irrigation Daily  . thiamine  100 mg Oral Daily  . vitamin A & D  1 application Topical BID    SUBJECTIVE: Mr. France Ravensrevette was seen laying in his bed complaining about it being too cold in the room. He states that he does not know what brought him to the hospital and feels that he is not getting adequate care. He also states that he needs to be repositioned as his back hurts. He continues to pick at his blisters. He was able to eat his breakfast without assistance.   Review of Systems: ROS unremarkable except above  Allergies  Allergen Reactions  . Amlodipine Besylate Swelling and  Other (See Comments)    "started off low and ended up severe; if, in fact, that is what's causing the swelling" (06/03/12)  . Levaquin [Levofloxacin In D5w] Rash    OBJECTIVE: Vitals:   07/08/17 2104 07/09/17 0525 07/09/17 0830 07/09/17 0833  BP: 135/76 (!) 116/57 100/71   Pulse: 65 61 61   Resp: 16 17 17    Temp: 98.1 F (36.7 C) 98.5 F (36.9 C) 98.5 F (36.9 C)   TempSrc: Oral  Oral   SpO2: 100% 93% (!) 85% 99%  Weight:      Height:       Body mass index is 36.8 kg/m.  Physical Exam  Constitutional: He appears to be writhing in pain. He appears distressed.  HENT:  Head: Normocephalic and atraumatic.  Cardiovascular: Normal rate, regular rhythm and normal heart sounds.   Pulmonary/Chest: Effort normal and breath sounds normal.  Musculoskeletal: He exhibits tenderness.  Neurological:  Not oriented to time and does not recall what brought him to the hospital  Skin: There is erythema.  Several blisters over bilateral upper extremities and upper trunk. Dried blood present over upper extremity  Psychiatric: His mood appears anxious. He is agitated. He exhibits disordered thought content.    Lab Results Lab Results  Component Value  Date   WBC 8.4 07/09/2017   HGB 8.5 (L) 07/09/2017   HCT 26.7 (L) 07/09/2017   MCV 93.0 07/09/2017   PLT 223 07/09/2017    Lab Results  Component Value Date   CREATININE 1.21 07/09/2017   BUN 26 (H) 07/09/2017   NA 138 07/09/2017   K 3.9 07/09/2017   CL 98 (L) 07/09/2017   CO2 30 07/09/2017    Lab Results  Component Value Date   ALT 7 (L) 07/07/2017   AST 12 (L) 07/07/2017   ALKPHOS 113 07/07/2017   BILITOT 0.6 07/07/2017     Microbiology: Recent Results (from the past 240 hour(s))  Blood culture (routine x 2)     Status: None (Preliminary result)   Collection Time: 07/07/17  7:55 PM  Result Value Ref Range Status   Specimen Description BLOOD RIGHT HAND  Final   Special Requests   Final    BOTTLES DRAWN AEROBIC AND ANAEROBIC  Blood Culture adequate volume   Culture  Setup Time   Final    GRAM POSITIVE COCCI IN CLUSTERS AEROBIC BOTTLE ONLY Organism ID to follow CRITICAL RESULT CALLED TO, READ BACK BY AND VERIFIED WITH: MMayford Knife Pharm.D. 14:55 07/08/17 ([email or web address])    Culture GRAM POSITIVE COCCI  Final   Report Status PENDING  Incomplete  Blood Culture ID Panel (Reflexed)     Status: Abnormal   Collection Time: 07/07/17  7:55 PM  Result Value Ref Range Status   Enterococcus species NOT DETECTED NOT DETECTED Final   Listeria monocytogenes NOT DETECTED NOT DETECTED Final   Staphylococcus species DETECTED (A) NOT DETECTED Final    Comment: CRITICAL RESULT CALLED TO, READ BACK BY AND VERIFIED WITH: M. Loretha Brasil.D. 14:55 07/08/17 ([email or web address])    Staphylococcus aureus DETECTED (A) NOT DETECTED Final    Comment: Methicillin (oxacillin)-resistant Staphylococcus aureus (MRSA). MRSA is predictably resistant to beta-lactam antibiotics (except ceftaroline). Preferred therapy is vancomycin unless clinically contraindicated. Patient requires contact precautions if  hospitalized. CRITICAL RESULT CALLED TO, READ BACK BY AND VERIFIED WITH: M. Mayford Knife Pharm.D. 14:55 07/08/17 ([email or web address])    Methicillin resistance DETECTED (A) NOT DETECTED Final    Comment: CRITICAL RESULT CALLED TO, READ BACK BY AND VERIFIED WITH: M. Mayford Knife Pharm.D. 14:55 07/08/17 ([email or web address])    Streptococcus species NOT DETECTED NOT DETECTED Final   Streptococcus agalactiae NOT DETECTED NOT DETECTED Final   Streptococcus pneumoniae NOT DETECTED NOT DETECTED Final   Streptococcus pyogenes NOT DETECTED NOT DETECTED Final   Acinetobacter baumannii NOT DETECTED NOT DETECTED Final   Enterobacteriaceae species NOT DETECTED NOT DETECTED Final   Enterobacter cloacae complex NOT DETECTED NOT DETECTED Final   Escherichia coli NOT DETECTED NOT DETECTED Final   Klebsiella oxytoca NOT DETECTED NOT DETECTED Final   Klebsiella pneumoniae NOT DETECTED NOT DETECTED Final   Proteus  species NOT DETECTED NOT DETECTED Final   Serratia marcescens NOT DETECTED NOT DETECTED Final   Haemophilus influenzae NOT DETECTED NOT DETECTED Final   Neisseria meningitidis NOT DETECTED NOT DETECTED Final   Pseudomonas aeruginosa NOT DETECTED NOT DETECTED Final   Candida albicans NOT DETECTED NOT DETECTED Final   Candida glabrata NOT DETECTED NOT DETECTED Final   Candida krusei NOT DETECTED NOT DETECTED Final   Candida parapsilosis NOT DETECTED NOT DETECTED Final   Candida tropicalis NOT DETECTED NOT DETECTED Final  Blood culture (routine x 2)     Status: None (Preliminary result)   Collection Time: 07/07/17  8:42 PM  Result Value Ref Range Status   Specimen  Description BLOOD RIGHT ANTECUBITAL  Final   Special Requests   Final    BOTTLES DRAWN AEROBIC AND ANAEROBIC Blood Culture adequate volume   Culture NO GROWTH < 24 HOURS  Final   Report Status PENDING  Incomplete  MRSA PCR Screening     Status: Abnormal   Collection Time: 07/08/17 12:45 AM  Result Value Ref Range Status   MRSA by PCR POSITIVE (A) NEGATIVE Final    Comment:        The GeneXpert MRSA Assay (FDA approved for NASAL specimens only), is one component of a comprehensive MRSA colonization surveillance program. It is not intended to diagnose MRSA infection nor to guide or monitor treatment for MRSA infections. RESULT CALLED TO, READ BACK BY AND VERIFIED WITHBarbaraann Cao: E CASTRO RN [phone] 07/08/17 A BROWNING     Lorenso CourierVahini Mahogani Holohan, MD [facility] [phone] pager   [phone] cell 07/09/2017, 9:07 AM

## 2017-07-09 NOTE — Progress Notes (Addendum)
[facility] Team 11 Progress Note  Subjective:  Blood cx's 7/31 1/2 + for MRSA, BCID + for MRSA.  Seen by ID, on IV vanc now.    Vitals:   07/08/17 2104 07/09/17 0525 07/09/17 0830 07/09/17 0833  BP: 135/76 (!) 116/57 100/71   Pulse: 65 61 61   Resp: 16 17 17    Temp: 98.1 F (36.7 C) 98.5 F (36.9 C) 98.5 F (36.9 C)   TempSrc: Oral  Oral   SpO2: 100% 93% (!) 85% 99%  Weight:      Height:        Inpatient medications: . apixaban  5 mg Oral BID  . atorvastatin  80 mg Oral Daily  . carvedilol  12.5 mg Oral BID WC  . Chlorhexidine Gluconate Cloth  6 each Topical Q0600  . divalproex  250 mg Oral TID  . feeding supplement (PRO-STAT SUGAR FREE 64)  30 mL Oral Q6H  . loratadine  10 mg Oral Daily  . memantine  5 mg Oral BID  . multivitamin with minerals  1 tablet Oral Daily  . mupirocin ointment  1 application Nasal BID  . potassium chloride SA  20 mEq Oral BID  . predniSONE  5 mg Oral Q breakfast  . sodium hypochlorite   Irrigation Daily  . thiamine  100 mg Oral Daily  . vitamin A & D  1 application Topical BID   . sodium chloride 65 mL/hr at 07/09/17 1343  . vancomycin     acetaminophen **OR** acetaminophen, HYDROcodone-acetaminophen, ondansetron **OR** ondansetron (ZOFRAN) IV  Exam: Obese, lying flat, debilitated but alert and mostly oriented, responds normally No jvd Chest clear bilat RRR no mrg Abd obese soft ntnd +BS Ext dry flaking skin both lower legs, no edema Patchy blistering rash over torso and proximal extremities Neuro gen'd weakness, nonfocal, Ox president and self, "2017" , not sure month or date   Recent Hx: Jan 2018 - admitted 20 days for aspiration PNA, developed large sacral decub required I&D, [facility] but after [facility] to [facility] was shortly back in hospital > April 2018 - sent from SNF for AMS and resp failure. Had pulm edema , low albumin/ cirrhosis by CT and diast CHF.  Rx bipap and IV lasix and improved.  Pt seen by palliative care  and made DNR.  DC" to SNF on qhs bipap at SNF w/ DNR.  PO lasix.       Impression: 1. MRSA bacteremia - no central line in place, may be related to blistering rash. Follow ID rec's, on vanc IV now.  Will d/w family and palliative care today regarding how aggressive to be; options are transfer [facility], local empiric care or hospice.  2. Anemia - prob due to chronic illness (#2). Stool FOB negative. Sp 3u prbc's.  3. Skin rash - according to pt's family rash is 2 mos old, treated at SNF and responds to prednisone but "comes right back" when it is stopped; cont pred 5mg  / day for now.   4. Sacral decub - massive decub in April 2018, now mostly healed over. Follow WC rec's.  5. Chronic afib - on Eliquis 6. AKI - creat up 1.40, baseline 0.9. Improving w IVF's, holding lasix 7. Volume - no sig edema on exam.  Hx of low alb 2.0.   8. HTN- low dose coreg 12.5 bid 9. Dementia 10. DNR  Plan - as above   Vinson Moselleob Boleslaw Borghi MD [facility] pgr [phone]  [phone] 07/09/2017, 7:36 PM    Recent Labs Lab 07/07/17 1955 07/08/17 0413 07/09/17 0758  NA 136 138 138  K 4.0 3.8 3.9  CL 96* 97* 98*  CO2 31 31 30   GLUCOSE 151* 86 116*  BUN 33* 33* 26*  CREATININE 1.41* 1.40* 1.21  CALCIUM 8.4* 8.6* 8.6*    Recent Labs Lab 07/07/17 1955  AST 12*  ALT 7*  ALKPHOS 113  BILITOT 0.6  PROT 5.0*  ALBUMIN 2.0*    Recent Labs Lab 07/07/17 1955 07/08/17 0413 07/09/17 0758  WBC 6.6 6.7 8.4  HGB 6.7* 7.4* 8.5*  HCT 21.8* 23.5* 26.7*  MCV 94.0 92.9 93.0  PLT 269 254 223   Iron/TIBC/Ferritin/ %Sat    Component Value Date/Time   IRON 24 (L) 07/08/2017 1429   TIBC 178 (L) 07/08/2017 1429   FERRITIN 316 07/08/2017 1429   IRONPCTSAT 13 (L) 07/08/2017 1429

## 2017-07-09 NOTE — Care Management Obs Status (Signed)
MEDICARE OBSERVATION STATUS NOTIFICATION   Patient Details  Name: Jesus MooresDouglas M Chason MRN: [id number] Date of Birth: 1946/10/29   Medicare Observation Status Notification Given:  Yes    Cherylann ParrClaxton, Simmie Garin S, RN 07/09/2017, 9:15 AM

## 2017-07-10 DIAGNOSIS — I4891 Unspecified atrial fibrillation: Secondary | ICD-10-CM

## 2017-07-10 DIAGNOSIS — K7460 Unspecified cirrhosis of liver: Secondary | ICD-10-CM

## 2017-07-10 DIAGNOSIS — L89150 Pressure ulcer of sacral region, unstageable: Secondary | ICD-10-CM

## 2017-07-10 DIAGNOSIS — I739 Peripheral vascular disease, unspecified: Secondary | ICD-10-CM

## 2017-07-10 DIAGNOSIS — I10 Essential (primary) hypertension: Secondary | ICD-10-CM

## 2017-07-10 DIAGNOSIS — A412 Sepsis due to unspecified staphylococcus: Secondary | ICD-10-CM

## 2017-07-10 DIAGNOSIS — Z7189 Other specified counseling: Secondary | ICD-10-CM

## 2017-07-10 DIAGNOSIS — I5032 Chronic diastolic (congestive) heart failure: Secondary | ICD-10-CM

## 2017-07-10 DIAGNOSIS — L120 Bullous pemphigoid: Secondary | ICD-10-CM

## 2017-07-10 DIAGNOSIS — R7881 Bacteremia: Secondary | ICD-10-CM

## 2017-07-10 LAB — CBC
HEMATOCRIT: 27.2 % — AB (ref 39.0–52.0)
HEMOGLOBIN: 8.5 g/dL — AB (ref 13.0–17.0)
MCH: 29.1 pg (ref 26.0–34.0)
MCHC: 31.3 g/dL (ref 30.0–36.0)
MCV: 93.2 fL (ref 78.0–100.0)
Platelets: 234 10*3/uL (ref 150–400)
RBC: 2.92 MIL/uL — AB (ref 4.22–5.81)
RDW: 16 % — ABNORMAL HIGH (ref 11.5–15.5)
WBC: 8.7 10*3/uL (ref 4.0–10.5)

## 2017-07-10 LAB — CULTURE, BLOOD (ROUTINE X 2): SPECIAL REQUESTS: ADEQUATE

## 2017-07-10 LAB — COMPREHENSIVE METABOLIC PANEL
ALT: 9 U/L — ABNORMAL LOW (ref 17–63)
ANION GAP: 8 (ref 5–15)
AST: 10 U/L — AB (ref 15–41)
Albumin: 2 g/dL — ABNORMAL LOW (ref 3.5–5.0)
Alkaline Phosphatase: 107 U/L (ref 38–126)
BUN: 26 mg/dL — ABNORMAL HIGH (ref 6–20)
CHLORIDE: 101 mmol/L (ref 101–111)
CO2: 30 mmol/L (ref 22–32)
Calcium: 8.8 mg/dL — ABNORMAL LOW (ref 8.9–10.3)
Creatinine, Ser: 1.32 mg/dL — ABNORMAL HIGH (ref 0.61–1.24)
GFR, EST NON AFRICAN AMERICAN: 53 mL/min — AB (ref >60)
Glucose, Bld: 75 mg/dL (ref 65–99)
POTASSIUM: 4.3 mmol/L (ref 3.5–5.1)
Sodium: 139 mmol/L (ref 135–145)
Total Bilirubin: 0.6 mg/dL (ref 0.3–1.2)
Total Protein: 5.2 g/dL — ABNORMAL LOW (ref 6.5–8.1)

## 2017-07-10 LAB — PROTIME-INR
INR: 1.45
Prothrombin Time: 17.7 seconds — ABNORMAL HIGH (ref 11.4–15.2)

## 2017-07-10 LAB — AMMONIA: Ammonia: 43 umol/L — ABNORMAL HIGH (ref 9–35)

## 2017-07-10 MED ORDER — MORPHINE SULFATE (PF) 2 MG/ML IV SOLN
2.0000 mg | INTRAVENOUS | Status: DC | PRN
  Administered 2017-07-10: 2 mg via INTRAVENOUS
  Filled 2017-07-10: qty 1

## 2017-07-10 MED ORDER — GABAPENTIN 300 MG PO CAPS: 300.0000 mg | ORAL_CAPSULE | Freq: Three times a day (TID) | ORAL | Status: DC

## 2017-07-10 MED ORDER — MORPHINE SULFATE (PF) 2 MG/ML IV SOLN
2.0000 mg | Freq: Four times a day (QID) | INTRAVENOUS | Status: DC | PRN
  Administered 2017-07-10: 2 mg via INTRAVENOUS
  Filled 2017-07-10: qty 1

## 2017-07-10 MED ORDER — GABAPENTIN 300 MG PO CAPS
300.0000 mg | ORAL_CAPSULE | Freq: Three times a day (TID) | ORAL | Status: DC
  Administered 2017-07-10: 300 mg via ORAL
  Filled 2017-07-10: qty 1

## 2017-07-10 MED ORDER — GABAPENTIN 100 MG PO CAPS
100.0000 mg | ORAL_CAPSULE | Freq: Two times a day (BID) | ORAL | Status: DC
  Administered 2017-07-10: 100 mg via ORAL
  Filled 2017-07-10: qty 1

## 2017-07-10 NOTE — Progress Notes (Signed)
[facility]  Date of Admission:  07/07/2017   Total days of antibiotics 3        Day 3 Vancomycin-Started 07/08/17         ASSESSMENT AND PLAN:  MRSA Bacteremia Blood culture from 7/31 shows MRSA growth.  -continue iv vancomycin              -Patient's has good kidney function with cr=1.32 (8/3) slightly up from previous 1.21 (8/2)  -Request pharmacy assistance with vanc dosing and trough monitoring  Bullous Pemphigoid? 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 Open wound with palpable bone per woc 8/1  Goals of Care -Palliative care had conversation with patient and his children regarding whether they would like to pursue aggressive care.  -Decision to continue current scope of care without transferring to [facility].  -We will continue with antibiotics.   Marland Kitchen. apixaban  5 mg Oral BID  . atorvastatin  80 mg Oral Daily  . carvedilol  12.5 mg Oral BID WC  . Chlorhexidine Gluconate Cloth  6 each Topical Q0600  . divalproex  250 mg Oral TID  . feeding supplement (PRO-STAT SUGAR FREE 64)  30 mL Oral Q6H  . loratadine  10 mg Oral Daily  . memantine  5 mg Oral BID  . multivitamin with minerals  1 tablet Oral Daily  . mupirocin ointment  1 application Nasal BID  . OLANZapine zydis  5 mg Oral QHS  . potassium chloride SA  20 mEq Oral BID  . predniSONE  5 mg Oral Q breakfast  . sodium hypochlorite   Irrigation Daily  . thiamine  100 mg Oral Daily    SUBJECTIVE: [name] was seen laying in his bed this afternoon. He was pleasant and stated that he was doing well. He denied any sob or chest pain.   Review of Systems: ROS none  Allergies  Allergen Reactions  . Amlodipine Besylate Swelling and Other (See Comments)    "started off low and ended up severe; if, in fact, that is what's causing the swelling" (06/03/12)  . Levaquin [Levofloxacin In D5w] Rash    OBJECTIVE: Vitals:   07/09/17 2130 07/10/17 0629 07/10/17  0829 07/10/17 1002  BP: (!) 146/74 (!) 146/80  (!) 132/54  Pulse: 66 76  84  Resp: 20 18  20   Temp: 98 F (36.7 C) 98.3 F (36.8 C)  98.5 F (36.9 C)  TempSrc: Oral Oral  Oral  SpO2: 98% (!) 87% 97% 94%  Weight: 259 lb (117.5 kg)     Height:       Body mass index is 36.64 kg/m.  Physical Exam  Constitutional: He is well-developed, well-nourished, and in no distress. No distress.  HENT:  Head: Normocephalic and atraumatic.  Cardiovascular: Normal rate, regular rhythm and normal heart sounds.   No murmur heard. Pulmonary/Chest: Effort normal and breath sounds normal. No respiratory distress. He has no wheezes.  Skin: Rash noted. There is erythema.  Patient has blisters and dried blood over bilateral upper extremities and on trunk  Psychiatric: His mood appears anxious. He is agitated. He exhibits disordered thought content.    Lab Results Lab Results  Component Value Date   WBC 8.7 07/10/2017   HGB 8.5 (L) 07/10/2017   HCT 27.2 (L) 07/10/2017   MCV 93.2 07/10/2017   PLT 234 07/10/2017    Lab Results  Component Value Date   CREATININE 1.32 (H)  07/10/2017   BUN 26 (H) 07/10/2017   NA 139 07/10/2017   K 4.3 07/10/2017   CL 101 07/10/2017   CO2 30 07/10/2017    Lab Results  Component Value Date   ALT 9 (L) 07/10/2017   AST 10 (L) 07/10/2017   ALKPHOS 107 07/10/2017   BILITOT 0.6 07/10/2017     Microbiology: Recent Results (from the past 240 hour(s))  Blood culture (routine x 2)     Status: Abnormal   Collection Time: 07/07/17  7:55 PM  Result Value Ref Range Status   Specimen Description BLOOD RIGHT HAND  Final   Special Requests   Final    BOTTLES DRAWN AEROBIC AND ANAEROBIC Blood Culture adequate volume   Culture  Setup Time   Final    GRAM POSITIVE COCCI IN CLUSTERS AEROBIC BOTTLE ONLY CRITICAL RESULT CALLED TO, READ BACK BY AND VERIFIED WITH: Artelia Laroche Pharm.D. 14:55 07/08/17 ([email or web address])    Culture METHICILLIN RESISTANT STAPHYLOCOCCUS AUREUS (A)  Final    Report Status 07/10/2017 FINAL  Final   Organism ID, Bacteria METHICILLIN RESISTANT STAPHYLOCOCCUS AUREUS  Final      Susceptibility   Methicillin resistant staphylococcus aureus - MIC*    CIPROFLOXACIN >=8 RESISTANT Resistant     ERYTHROMYCIN >=8 RESISTANT Resistant     GENTAMICIN <=0.5 SENSITIVE Sensitive     OXACILLIN >=4 RESISTANT Resistant     TETRACYCLINE <=1 SENSITIVE Sensitive     VANCOMYCIN <=0.5 SENSITIVE Sensitive     TRIMETH/SULFA >=320 RESISTANT Resistant     CLINDAMYCIN <=0.25 SENSITIVE Sensitive     RIFAMPIN <=0.5 SENSITIVE Sensitive     Inducible Clindamycin NEGATIVE Sensitive     * METHICILLIN RESISTANT STAPHYLOCOCCUS AUREUS  Blood Culture ID Panel (Reflexed)     Status: Abnormal   Collection Time: 07/07/17  7:55 PM  Result Value Ref Range Status   Enterococcus species NOT DETECTED NOT DETECTED Final   Listeria monocytogenes NOT DETECTED NOT DETECTED Final   Staphylococcus species DETECTED (A) NOT DETECTED Final    Comment: CRITICAL RESULT CALLED TO, READ BACK BY AND VERIFIED WITH: MMayford Knife Pharm.D. 14:55 07/08/17 ([email or web address])    Staphylococcus aureus DETECTED (A) NOT DETECTED Final    Comment: Methicillin (oxacillin)-resistant Staphylococcus aureus (MRSA). MRSA is predictably resistant to beta-lactam antibiotics (except ceftaroline). Preferred therapy is vancomycin unless clinically contraindicated. Patient requires contact precautions if  hospitalized. CRITICAL RESULT CALLED TO, READ BACK BY AND VERIFIED WITH: M. Mayford Knife Pharm.D. 14:55 07/08/17 ([email or web address])    Methicillin resistance DETECTED (A) NOT DETECTED Final    Comment: CRITICAL RESULT CALLED TO, READ BACK BY AND VERIFIED WITH: M. Mayford Knife Pharm.D. 14:55 07/08/17 ([email or web address])    Streptococcus species NOT DETECTED NOT DETECTED Final   Streptococcus agalactiae NOT DETECTED NOT DETECTED Final   Streptococcus pneumoniae NOT DETECTED NOT DETECTED Final   Streptococcus pyogenes NOT DETECTED NOT DETECTED Final   Acinetobacter  baumannii NOT DETECTED NOT DETECTED Final   Enterobacteriaceae species NOT DETECTED NOT DETECTED Final   Enterobacter cloacae complex NOT DETECTED NOT DETECTED Final   Escherichia coli NOT DETECTED NOT DETECTED Final   Klebsiella oxytoca NOT DETECTED NOT DETECTED Final   Klebsiella pneumoniae NOT DETECTED NOT DETECTED Final   Proteus species NOT DETECTED NOT DETECTED Final   Serratia marcescens NOT DETECTED NOT DETECTED Final   Haemophilus influenzae NOT DETECTED NOT DETECTED Final   Neisseria meningitidis NOT DETECTED NOT DETECTED Final   Pseudomonas aeruginosa NOT DETECTED NOT DETECTED Final   Candida albicans NOT DETECTED NOT DETECTED  Final   Candida glabrata NOT DETECTED NOT DETECTED Final   Candida krusei NOT DETECTED NOT DETECTED Final   Candida parapsilosis NOT DETECTED NOT DETECTED Final   Candida tropicalis NOT DETECTED NOT DETECTED Final  Blood culture (routine x 2)     Status: None (Preliminary result)   Collection Time: 07/07/17  8:42 PM  Result Value Ref Range Status   Specimen Description BLOOD RIGHT ANTECUBITAL  Final   Special Requests   Final    BOTTLES DRAWN AEROBIC AND ANAEROBIC Blood Culture adequate volume   Culture NO GROWTH 2 DAYS  Final   Report Status PENDING  Incomplete  MRSA PCR Screening     Status: Abnormal   Collection Time: 07/08/17 12:45 AM  Result Value Ref Range Status   MRSA by PCR POSITIVE (A) NEGATIVE Final    Comment:        The GeneXpert MRSA Assay (FDA approved for NASAL specimens only), is one component of a comprehensive MRSA colonization surveillance program. It is not intended to diagnose MRSA infection nor to guide or monitor treatment for MRSA infections. RESULT CALLED TO, READ BACK BY AND VERIFIED WITHBarbaraann Cao: E CASTRO RN [phone] 07/08/17 A BROWNING     Lorenso CourierVahini Nadine Ryle, MD [facility] [phone] pager   [phone] cell 07/10/2017, 1:32 PM

## 2017-07-10 NOTE — Progress Notes (Signed)
PROGRESS NOTE    Jesus [name]  RUE:[id number] DOB: 1946-07-04 DOA: 07/07/2017 PCP: Gerre Pebbles, [name]   Chief Complaint  Patient presents with  . Abnormal Lab  . Altered Mental Status  . Rash     Brief Narrative:  71 year old with history of diastolic heart failure, atrial fibrillation, anemia, liver services, brought to the emergency department with complaints of low hemoglobin. Admitted for MRSA bacteremia and anemia. Infectious disease consulted and appreciated. Palliative care also following. Assessment & Plan   MRSA bacteremia -Patient currently does not have central line in place, possibly due to blistering rash -Infectious disease consultation appreciated, currently on IV vancomycin -Blood cultures show 1 to staph aureus -Patient will need 6 weeks of IV vancomycin if patient is to be treated aggressively, will need PICC line -Discussed with palliative care, patient has pulled out 2 central lines in the past -Echocardiogram: EF 55-60%  Anemia of chronic illness -FOBT negative -hemoglobin was 6.7, Patient has received 3 u PRBC -Currently 8.5 -Continue to monitor CBC  Blistering skin rash -Continue wound care -Per previous documentation, rashes been around her approximate 2 months, was treated at nursing facility in response to prednisone however once prednisone is discontinued the rash comes back. -Continue prednisone  Sacral decubitus ulcer -Stage IV.  -Continue wound care  Chronic atrial fibrillation -Continue Eliquis  Acute kidney injury -Baseline creatinine proximally 0.9, was up to 1.41 -Lasix held given IV fluids -Creatinine currently 1.32 -Continue to monitor BMP  Essential hypertension -Continue Coreg, Coreg  Dementia -Stable, continue Namenda and Zyprexa  Goals of care -Palliative care consulted and appreciated, pending family decision. It seems that 3 daughters agree to hospice, but son wants aggressive care -Patient currently DO NOT  RESUSCITATE  DVT Prophylaxis  Eliquis  Code Status: DNR  Family Communication: None at bedside  Disposition Plan: Admitted. Pending further decisions from family regarding hospice vs aggressive care. Dispo TBD  Consultants Palliative Care Infectious disease  Procedures  Echcoardiogram  Antibiotics   Anti-infectives    Start     Dose/Rate Route Frequency Ordered Stop   07/09/17 1800  vancomycin (VANCOCIN) 1,250 mg in sodium chloride 0.9 % 250 mL IVPB     1,250 mg 166.7 mL/hr over 90 Minutes Intravenous Every 24 hours 07/08/17 1606     07/08/17 1700  vancomycin (VANCOCIN) 2,000 mg in sodium chloride 0.9 % 500 mL IVPB     2,000 mg 250 mL/hr over 120 Minutes Intravenous  Once 07/08/17 1606 07/08/17 2249      Subjective:   Riley Lam Borcherding seen and examined today.  Patient currently complains of blisters and pain with his wounds. Currently denies chest pain, shortness of breath. (History of dementia)  Objective:   Vitals:   07/09/17 2130 07/10/17 0629 07/10/17 0829 07/10/17 1002  BP: (!) 146/74 (!) 146/80  (!) 132/54  Pulse: 66 76  84  Resp: 20 18  20   Temp: 98 F (36.7 C) 98.3 F (36.8 C)  98.5 F (36.9 C)  TempSrc: Oral Oral  Oral  SpO2: 98% (!) 87% 97% 94%  Weight: 117.5 kg (259 lb)     Height:        Intake/Output Summary (Last 24 hours) at 07/10/17 1213 Last data filed at 07/10/17 1003  Gross per 24 hour  Intake              715 ml  Output             1050 ml  Net             -  335 ml   Filed Weights   07/08/17 0046 07/08/17 2058 07/09/17 2130  Weight: 118.6 kg (261 lb 7.5 oz) 118 kg (260 lb 2.3 oz) 117.5 kg (259 lb)    Exam  General: Well developed, well nourished, NAD, appears stated age  HEENT: NCAT, mucous membranes moist.   Cardiovascular: S1 S2 auscultated, no rubs, murmurs or gallops. Regular rate and rhythm.  Respiratory: Clear to auscultation bilaterally with equal chest rise (anteriorly)  Abdomen: Soft, obese, nontender, nondistended, +  bowel sounds  Extremities: warm dry without cyanosis clubbing or edema  Neuro: AAOx2 (self and place), otherwise nonfocal  Skin: diffuse blistering rash all over body with serosanguineous discharge  Psych: Appropriate   Data Reviewed: I have personally reviewed following labs and imaging studies  CBC:  Recent Labs Lab 07/07/17 1955 07/08/17 0413 07/09/17 0758 07/10/17 0517  WBC 6.6 6.7 8.4 8.7  HGB 6.7* 7.4* 8.5* 8.5*  HCT 21.8* 23.5* 26.7* 27.2*  MCV 94.0 92.9 93.0 93.2  PLT 269 254 223 234   Basic Metabolic Panel:  Recent Labs Lab 07/07/17 1955 07/08/17 0413 07/09/17 0758 07/10/17 0517  NA 136 138 138 139  K 4.0 3.8 3.9 4.3  CL 96* 97* 98* 101  CO2 31 31 30 30   GLUCOSE 151* 86 116* 75  BUN 33* 33* 26* 26*  CREATININE 1.41* 1.40* 1.21 1.32*  CALCIUM 8.4* 8.6* 8.6* 8.8*   GFR: Estimated Creatinine Clearance: 66.4 mL/min (A) (by C-G formula based on SCr of 1.32 mg/dL (H)). Liver Function Tests:  Recent Labs Lab 07/07/17 1955 07/10/17 0517  AST 12* 10*  ALT 7* 9*  ALKPHOS 113 107  BILITOT 0.6 0.6  PROT 5.0* 5.2*  ALBUMIN 2.0* 2.0*   No results for input(s): LIPASE, AMYLASE in the last 168 hours.  Recent Labs Lab 07/10/17 1041  AMMONIA 43*   Coagulation Profile: No results for input(s): INR, PROTIME in the last 168 hours. Cardiac Enzymes: No results for input(s): CKTOTAL, CKMB, CKMBINDEX, TROPONINI in the last 168 hours. BNP (last 3 results) No results for input(s): PROBNP in the last 8760 hours. HbA1C: No results for input(s): HGBA1C in the last 72 hours. CBG:  Recent Labs Lab 07/07/17 2010  GLUCAP 116*   Lipid Profile: No results for input(s): CHOL, HDL, LDLCALC, TRIG, CHOLHDL, LDLDIRECT in the last 72 hours. Thyroid Function Tests: No results for input(s): TSH, T4TOTAL, FREET4, T3FREE, THYROIDAB in the last 72 hours. Anemia Panel:  Recent Labs  07/08/17 1429  VITAMINB12 293  FOLATE 16.0  FERRITIN 316  TIBC 178*  IRON 24*    RETICCTPCT 2.1   Urine analysis:    Component Value Date/Time   COLORURINE AMBER (A) 03/12/2017 1238   APPEARANCEUR CLOUDY (A) 03/12/2017 1238   LABSPEC 1.018 03/12/2017 1238   PHURINE 5.0 03/12/2017 1238   GLUCOSEU NEGATIVE 03/12/2017 1238   HGBUR MODERATE (A) 03/12/2017 1238   BILIRUBINUR NEGATIVE 03/12/2017 1238   KETONESUR NEGATIVE 03/12/2017 1238   PROTEINUR 100 (A) 03/12/2017 1238   UROBILINOGEN 4.0 (H) 06/02/2012 1200   NITRITE NEGATIVE 03/12/2017 1238   LEUKOCYTESUR LARGE (A) 03/12/2017 1238   Sepsis Labs: @LABRCNTIP (procalcitonin:4,lacticidven:4)  ) Recent Results (from the past 240 hour(s))  Blood culture (routine x 2)     Status: Abnormal   Collection Time: 07/07/17  7:55 PM  Result Value Ref Range Status   Specimen Description BLOOD RIGHT HAND  Final   Special Requests   Final    BOTTLES DRAWN AEROBIC AND ANAEROBIC Blood Culture  adequate volume   Culture  Setup Time   Final    GRAM POSITIVE COCCI IN CLUSTERS AEROBIC BOTTLE ONLY CRITICAL RESULT CALLED TO, READ BACK BY AND VERIFIED WITH: M. Mayford Knife Pharm.D. 14:55 07/08/17 ([email or web address])    Culture METHICILLIN RESISTANT STAPHYLOCOCCUS AUREUS (A)  Final   Report Status 07/10/2017 FINAL  Final   Organism ID, Bacteria METHICILLIN RESISTANT STAPHYLOCOCCUS AUREUS  Final      Susceptibility   Methicillin resistant staphylococcus aureus - MIC*    CIPROFLOXACIN >=8 RESISTANT Resistant     ERYTHROMYCIN >=8 RESISTANT Resistant     GENTAMICIN <=0.5 SENSITIVE Sensitive     OXACILLIN >=4 RESISTANT Resistant     TETRACYCLINE <=1 SENSITIVE Sensitive     VANCOMYCIN <=0.5 SENSITIVE Sensitive     TRIMETH/SULFA >=320 RESISTANT Resistant     CLINDAMYCIN <=0.25 SENSITIVE Sensitive     RIFAMPIN <=0.5 SENSITIVE Sensitive     Inducible Clindamycin NEGATIVE Sensitive     * METHICILLIN RESISTANT STAPHYLOCOCCUS AUREUS  Blood Culture ID Panel (Reflexed)     Status: Abnormal   Collection Time: 07/07/17  7:55 PM  Result Value Ref Range  Status   Enterococcus species NOT DETECTED NOT DETECTED Final   Listeria monocytogenes NOT DETECTED NOT DETECTED Final   Staphylococcus species DETECTED (A) NOT DETECTED Final    Comment: CRITICAL RESULT CALLED TO, READ BACK BY AND VERIFIED WITH: MMayford Knife Pharm.D. 14:55 07/08/17 ([email or web address])    Staphylococcus aureus DETECTED (A) NOT DETECTED Final    Comment: Methicillin (oxacillin)-resistant Staphylococcus aureus (MRSA). MRSA is predictably resistant to beta-lactam antibiotics (except ceftaroline). Preferred therapy is vancomycin unless clinically contraindicated. Patient requires contact precautions if  hospitalized. CRITICAL RESULT CALLED TO, READ BACK BY AND VERIFIED WITH: M. Mayford Knife Pharm.D. 14:55 07/08/17 ([email or web address])    Methicillin resistance DETECTED (A) NOT DETECTED Final    Comment: CRITICAL RESULT CALLED TO, READ BACK BY AND VERIFIED WITH: M. Mayford Knife Pharm.D. 14:55 07/08/17 ([email or web address])    Streptococcus species NOT DETECTED NOT DETECTED Final   Streptococcus agalactiae NOT DETECTED NOT DETECTED Final   Streptococcus pneumoniae NOT DETECTED NOT DETECTED Final   Streptococcus pyogenes NOT DETECTED NOT DETECTED Final   Acinetobacter baumannii NOT DETECTED NOT DETECTED Final   Enterobacteriaceae species NOT DETECTED NOT DETECTED Final   Enterobacter cloacae complex NOT DETECTED NOT DETECTED Final   Escherichia coli NOT DETECTED NOT DETECTED Final   Klebsiella oxytoca NOT DETECTED NOT DETECTED Final   Klebsiella pneumoniae NOT DETECTED NOT DETECTED Final   Proteus species NOT DETECTED NOT DETECTED Final   Serratia marcescens NOT DETECTED NOT DETECTED Final   Haemophilus influenzae NOT DETECTED NOT DETECTED Final   Neisseria meningitidis NOT DETECTED NOT DETECTED Final   Pseudomonas aeruginosa NOT DETECTED NOT DETECTED Final   Candida albicans NOT DETECTED NOT DETECTED Final   Candida glabrata NOT DETECTED NOT DETECTED Final   Candida krusei NOT DETECTED NOT DETECTED Final   Candida  parapsilosis NOT DETECTED NOT DETECTED Final   Candida tropicalis NOT DETECTED NOT DETECTED Final  Blood culture (routine x 2)     Status: None (Preliminary result)   Collection Time: 07/07/17  8:42 PM  Result Value Ref Range Status   Specimen Description BLOOD RIGHT ANTECUBITAL  Final   Special Requests   Final    BOTTLES DRAWN AEROBIC AND ANAEROBIC Blood Culture adequate volume   Culture NO GROWTH 2 DAYS  Final   Report Status PENDING  Incomplete  MRSA PCR Screening     Status: Abnormal   Collection  Time: 07/08/17 12:45 AM  Result Value Ref Range Status   MRSA by PCR POSITIVE (A) NEGATIVE Final    Comment:        The GeneXpert MRSA Assay (FDA approved for NASAL specimens only), is one component of a comprehensive MRSA colonization surveillance program. It is not intended to diagnose MRSA infection nor to guide or monitor treatment for MRSA infections. RESULT CALLED TO, READ BACK BY AND VERIFIED WITHBarbaraann Cao: E CASTRO RN [phone] 07/08/17 A BROWNING       Radiology Studies: No results found.   Scheduled Meds: . apixaban  5 mg Oral BID  . atorvastatin  80 mg Oral Daily  . carvedilol  12.5 mg Oral BID WC  . Chlorhexidine Gluconate Cloth  6 each Topical Q0600  . divalproex  250 mg Oral TID  . feeding supplement (PRO-STAT SUGAR FREE 64)  30 mL Oral Q6H  . loratadine  10 mg Oral Daily  . memantine  5 mg Oral BID  . multivitamin with minerals  1 tablet Oral Daily  . mupirocin ointment  1 application Nasal BID  . OLANZapine zydis  5 mg Oral QHS  . potassium chloride SA  20 mEq Oral BID  . predniSONE  5 mg Oral Q breakfast  . sodium hypochlorite   Irrigation Daily  . thiamine  100 mg Oral Daily   Continuous Infusions: . vancomycin Stopped (07/09/17 2110)     LOS: 1 day   Time Spent in minutes   30 minutes  Antero Derosia D.O. on 07/10/2017 at 12:13 PM  Between 7am to 7pm - Pager - [phone]  After 7pm go to [email or web address] - password [email or web address]  And look for the night coverage  person covering for me after hours  [facility] Office  [phone]

## 2017-07-10 NOTE — Care Management Note (Signed)
Case Management Note  Patient Details  Name: [name] M Forti MRN: [id number] Date of Birth: [birth year]  Subjective/Objective:  Pt admitted on 07/07/17 with MRSA bacteremia, anemia, and blistering skin rash.  PTA, [facility].                    Action/Plan: CSW consulted to facilitate return to SNF upon medical stability.  Will follow as pt progresses.    Expected Discharge Date:       Expected Discharge Plan:  Skilled Nursing Facility (From [facility] - CSW consulted 8/2)  In-House Referral:  Clinical Social Work  Discharge planning Services  CM Consult  Post Acute Care Choice:    Choice offered to:     DME Arranged:    DME Agency:     HH Arranged:    HH Agency:     Status of Service:  In process, will continue to follow  If discussed at Long Length of Stay Meetings, dates discussed:    Additional Comments:  Quintella BatonJulie W. Lonita Debes, RN, BSN  Trauma/Neuro ICU Case Manager [phone]

## 2017-07-10 NOTE — Progress Notes (Signed)
Daily Progress Note   Patient Name: KIMBERLEY DASTRUP       Date: 07/10/2017 DOB: 1945/12/17  Age: 71 y.o. MRN#: [id number] Attending Physician: Cristal Ford, DO Primary Care Physician: Adron Bene, [name] Admit Date: 07/07/2017  Reason for Consultation/Follow-up: Establishing goals of care, Non pain symptom management and Pain control  Subjective: Spoke with [name] this morning. She her sister's and her aunt all agree that best plan of care for patient would be Hospice. However, patient's son adamantly disagrees and wishes to pursue full scope of care, without transfer to [facility] for dermatologic consult. They do however, request aggressive pain management and comfort care along side of medical interventions of IV antibiotics for bacteremia and prednisone for skin bullae.   Patient in bed calling out "Help me! Help me!" I met at bedside with patient. He is oriented to person, place and time. He tells me he is in the hospital because he fell. He complains of a burning pain in his lower extremities. He is asking for wraps to be cut off. He is able to verbalize reason for wraps (to decrease swelling in legs).   We discussed his quality of life and GOC. I expressed concern to him that he is approaching end of life no matter what interventions we are providing to him. I explained his current conditions and the barriers to treating those conditions. He says I am the first person who has discussed this with him. I reviewed the difference between continued aggressive medical care and comfort care and options for Hospice. I told him I had been in discussion with his family members regarding this as well. He was unable to elicit any specific GOC- he became somewhat confused and distracted during our  conversation. I believe his understanding and decision making is limited due to baseline dementia, pain, poor functional and poor cognitive status.   Review of Systems  Constitutional: Negative.   Cardiovascular: Negative.   Musculoskeletal: Positive for falls, joint pain and myalgias.  Psychiatric/Behavioral: Positive for memory loss. Negative for depression. The patient does not have insomnia.     Length of Stay: 1  Current Medications: Scheduled Meds:  . apixaban  5 mg Oral BID  . atorvastatin  80 mg Oral Daily  . carvedilol  12.5 mg Oral BID WC  . Chlorhexidine Gluconate  Cloth  6 each Topical [id number]  . divalproex  250 mg Oral TID  . feeding supplement (PRO-STAT SUGAR FREE 64)  30 mL Oral Q6H  . gabapentin  100 mg Oral BID  . loratadine  10 mg Oral Daily  . memantine  5 mg Oral BID  . multivitamin with minerals  1 tablet Oral Daily  . mupirocin ointment  1 application Nasal BID  . OLANZapine zydis  5 mg Oral QHS  . potassium chloride SA  20 mEq Oral BID  . predniSONE  5 mg Oral Q breakfast  . sodium hypochlorite   Irrigation Daily  . thiamine  100 mg Oral Daily    Continuous Infusions: . vancomycin Stopped (07/09/17 2110)    PRN Meds: acetaminophen **OR** acetaminophen, ALPRAZolam, diphenhydrAMINE, HYDROcodone-acetaminophen, morphine injection, ondansetron **OR** ondansetron (ZOFRAN) IV  Physical Exam  Constitutional: He is oriented to person, place, and time.  Neurological: He is alert and oriented to person, place, and time.  Patient oriented, able to converse, but does become confused during conversation  Skin:  Diffuse large skin bullae in multiple stages of healing over whole body            Vital Signs: BP (!) 132/54   Pulse 84   Temp 98.5 F (36.9 C) (Oral)   Resp 20   Ht 5' 10.5" (1.791 m)   Wt 117.5 kg (259 lb)   SpO2 94%   BMI 36.64 kg/m  SpO2: SpO2: 94 % O2 Device: O2 Device: Not Delivered O2 Flow Rate:    Intake/output summary:    Intake/Output Summary (Last 24 hours) at 07/10/17 1514 Last data filed at 07/10/17 1310  Gross per 24 hour  Intake              655 ml  Output             1200 ml  Net             -545 ml   LBM: Last BM Date: 07/09/17 Baseline Weight: Weight: 118.6 kg (261 lb 7.5 oz) Most recent weight: Weight: 117.5 kg (259 lb)       Palliative Assessment/Data: PPS: 30%      Patient Active Problem List   Diagnosis Date Noted  . Hepatic cirrhosis ([place])   . Advance care planning   . Staphylococcal sepsis ([place]) 07/09/2017  . Bullous pemphigoid   . MRSA bacteremia   . Normochromic normocytic anemia 07/07/2017  . Anemia 07/07/2017  . PNA (pneumonia) 03/23/2017  . Hypoxemia   . OSA on CPAP   . Acute respiratory failure with hypoxia and hypercapnia ([place]) 02/28/2017  . Acute pulmonary edema ([place]) 02/28/2017  . Sepsis secondary to UTI ([place]) 02/28/2017  . Goals of care, counseling/discussion   . Palliative care encounter   . Acute encephalopathy 02/24/2017  . Palliative care by specialist   . Decubitus ulcer of sacral region, unstageable ([place]) 12/30/2016  . Altered mental status 12/30/2016  . Hyponatremia 07/15/2015  . Hypokalemia   . Chronic diastolic CHF (congestive heart failure) ([place]) 04/02/2015  . Pulmonary vascular congestion   . Peripheral edema   . 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 Renal artery stenosis ([place]) 02/17/2014  . Peripheral vascular disease ([place]) 04/15/2013  . Morbid obesity ([place]) 06/11/2012  . Essential hypertension 05/18/2012  . Atrial fibrillation ([place]) 04/27/2012    Palliative Care Assessment & Plan   Patient Profile: 71 y.o. male  with past medical history of dementia, OSA, HTN,  CHF, A. Fib, hepatic cirrhosis, chronic anemia, non healing decubitis ulcers requiring surgical debridement, chronic venous insufficieny, chronic edema, lesions in liver by CT (thought to be cyst vs pseudocyst) admitted on 07/07/2017 with low hemoglobin and  multiple blistering wounds over his body. Workup reveals blood cultures positive for MRSA. Patient has history of multiple admissions (3 in the last six months- last in March for AMS- hypoxia and hypercarbia, CHF exacerbation, sepsis) and poor functional status and medical noncompliance. Palliative medicine consulted for [place].    Assessment/Recommendations/Plan   Continue current scope of care locally- do not transfer to [facility]. Proceed with antibiotics, steroids, and aggressive pain management  Family conflict- three daughters, including [name] who is HCPOA would desire transition to Hospice and comfort measures, however, Merrily Pew- patient's son, wants to proceed with full scope care. Per Genene Churn is going to call me with questions.  Noted patient's hyperammonia- discussed with patient's daughter and Dr. Ree Kida- likely not contributing to patient's mental status- lactulose would be problematic with open sacral wound- however, this is further indication for need for comfort measures as patient is intolerant to treatment  Symptom mgmt: increase morphine frequency to 35m IV q2hr prn; start Neurontin 3032mTID for lower extremity burning pain likely due to chronic venous insufficiency  [facility] interventions- PMT will continue to follow- plan to see patient again on Sunday, August 5. If needed sooner please page provider on service.  Goals of Care and Additional Recommendations:  Limitations on Scope of Treatment: Full Scope Treatment and [facility] Code Status:  DNR  Prognosis:   < 6 months due to PPS 30%, poor nutritional status- albumin 2.0; MRSA bacteremia  Discharge Planning:  To Be Determined  Care plan was discussed with patient's daughter- DuJonette EvaDr. MiRee Kidapatient's RN- ReWells Guiles Thank you for allowing the Palliative Medicine Team to assist in the care of this patient.   Time In: 0830 Time Out:  0930 Time In: 1500  Time Out: 1545 Total Time: 105 Prolonged services billed: Yes  Greater than 50%  of this time was spent counseling and coordinating care related to the above assessment and plan.  KaMariana KaufmanAGNP-C Palliative Medicine   Please contact Palliative Medicine Team phone at [phone] questions and concerns.

## 2017-07-11 LAB — BASIC METABOLIC PANEL
ANION GAP: 6 (ref 5–15)
BUN: 29 mg/dL — ABNORMAL HIGH (ref 6–20)
CO2: 32 mmol/L (ref 22–32)
Calcium: 9 mg/dL (ref 8.9–10.3)
Chloride: 101 mmol/L (ref 101–111)
Creatinine, Ser: 1.36 mg/dL — ABNORMAL HIGH (ref 0.61–1.24)
GFR calc Af Amer: 59 mL/min — ABNORMAL LOW (ref >60)
GFR, EST NON AFRICAN AMERICAN: 51 mL/min — AB (ref >60)
GLUCOSE: 67 mg/dL (ref 65–99)
Potassium: 4.7 mmol/L (ref 3.5–5.1)
SODIUM: 139 mmol/L (ref 135–145)

## 2017-07-11 LAB — BLOOD GAS, ARTERIAL
Acid-Base Excess: 5.5 mmol/L — ABNORMAL HIGH (ref 0.0–2.0)
BICARBONATE: 31.4 mmol/L — AB (ref 20.0–28.0)
Drawn by: 28338
O2 Content: 2 L/min
O2 Saturation: 97.5 %
PH ART: 7.312 — AB (ref 7.350–7.450)
PO2 ART: 136 mmHg — AB (ref 83.0–108.0)
Patient temperature: 98.6
pCO2 arterial: 64 mmHg — ABNORMAL HIGH (ref 32.0–48.0)

## 2017-07-11 LAB — CBC
HCT: 27.5 % — ABNORMAL LOW (ref 39.0–52.0)
Hemoglobin: 8.4 g/dL — ABNORMAL LOW (ref 13.0–17.0)
MCH: 29.3 pg (ref 26.0–34.0)
MCHC: 30.5 g/dL (ref 30.0–36.0)
MCV: 95.8 fL (ref 78.0–100.0)
PLATELETS: 237 10*3/uL (ref 150–400)
RBC: 2.87 MIL/uL — ABNORMAL LOW (ref 4.22–5.81)
RDW: 15.8 % — AB (ref 11.5–15.5)
WBC: 9.6 10*3/uL (ref 4.0–10.5)

## 2017-07-11 LAB — AMMONIA: AMMONIA: 78 umol/L — AB (ref 9–35)

## 2017-07-11 MED ORDER — MORPHINE SULFATE (PF) 2 MG/ML IV SOLN: 1.0000 mg | INTRAVENOUS | Status: DC | PRN

## 2017-07-11 MED ORDER — LORAZEPAM 0.5 MG PO TABS
0.5000 mg | ORAL_TABLET | Freq: Three times a day (TID) | ORAL | Status: DC | PRN
  Administered 2017-07-13 (×2): 0.5 mg via ORAL
  Filled 2017-07-11 (×3): qty 1

## 2017-07-11 MED ORDER — NALOXONE HCL 0.4 MG/ML IJ SOLN
0.1000 mg | Freq: Once | INTRAMUSCULAR | Status: AC
  Administered 2017-07-11: 0.1 mg via INTRAVENOUS

## 2017-07-11 MED ORDER — NALOXONE HCL 0.4 MG/ML IJ SOLN
0.4000 mg | Freq: Once | INTRAMUSCULAR | Status: AC
  Administered 2017-07-11: 0.4 mg via INTRAVENOUS
  Filled 2017-07-11: qty 1

## 2017-07-11 MED ORDER — HYDROCODONE-ACETAMINOPHEN 5-325 MG PO TABS
1.0000 | ORAL_TABLET | Freq: Four times a day (QID) | ORAL | Status: DC | PRN
Start: 2017-07-11 — End: 2017-07-13
  Administered 2017-07-12 – 2017-07-13 (×2): 1 via ORAL
  Filled 2017-07-11 (×2): qty 1

## 2017-07-11 MED ORDER — MORPHINE SULFATE (PF) 2 MG/ML IV SOLN
1.0000 mg | INTRAVENOUS | Status: DC | PRN
  Administered 2017-07-13 (×2): 1 mg via INTRAVENOUS
  Filled 2017-07-11 (×2): qty 1

## 2017-07-11 MED ORDER — NALOXONE HCL 0.4 MG/ML IJ SOLN
INTRAMUSCULAR | Status: AC
  Filled 2017-07-11: qty 1

## 2017-07-11 NOTE — Progress Notes (Signed)
Attempted report to 3 W. 

## 2017-07-11 NOTE — Progress Notes (Signed)
BP 110/59, HR 55, R 32, 82% on room air.  Patient responds to pain.  Unable to give PO Meds.  Dr. Isidoro Donningai notified.

## 2017-07-11 NOTE — Progress Notes (Signed)
Pt transported on BIPAP to 3W31 with Rapid Response, RN and RT without incidence.

## 2017-07-11 NOTE — Progress Notes (Signed)
Attempted to call report to 2C. 

## 2017-07-11 NOTE — Progress Notes (Signed)
Pharmacy Antibiotic Note  Jesus MooresDouglas M Evinger is a 71 y.o. male admitted on 07/07/2017 and found to have MRSA bacteremia.  Pharmacy has been consulted for vancomycin dosing.  The patient's BMET ordered for this AM was not drawn. Labs from yesterday show a trend up in SCr - 1.32 << 1.21, CrCl~60-70 ml/min. Dose remains appropriate for now - will monitor trends in SCr.   Plan: 1. Continue Vancomycin 1250 mg IV every 24 hours 2. Will check a BMET on 8/5 AM to evaluate SCr 3. Will continue to follow renal function, culture results, LOT, and antibiotic de-escalation plans   Height: 5' 10.5" (179.1 cm) Weight: 259 lb 11.2 oz (117.8 kg) IBW/kg (Calculated) : 74.15  Temp (24hrs), Avg:98.7 F (37.1 C), Min:98.2 F (36.8 C), Max:99.7 F (37.6 C)   Recent Labs Lab 07/07/17 1955 07/08/17 0413 07/09/17 0758 07/10/17 0517  WBC 6.6 6.7 8.4 8.7  CREATININE 1.41* 1.40* 1.21 1.32*    Estimated Creatinine Clearance: 66.5 mL/min (A) (by C-G formula based on SCr of 1.32 mg/dL (H)).    Allergies  Allergen Reactions  . Amlodipine Besylate Swelling and Other (See Comments)    "started off low and ended up severe; if, in fact, that is what's causing the swelling" (06/03/12)  . Levaquin [Levofloxacin In D5w] Rash    Antimicrobials this admission:  Vanc 8/1 >>   Dose adjustments this admission:  N/a  Microbiology results:  7/31 BCx: 1/2 MRSA 8/2 BCx: ngtd 8/1 MRSA PCR: pos  Thank you for allowing pharmacy to be a part of this patient's care.  Georgina PillionElizabeth Feiga Nadel, PharmD, BCPS Clinical Pharmacist Pager: [phone] Clinical phone for 07/11/2017 from 7a-3:30p: [phone] If after 3:30p, please call main pharmacy at: [phone] 07/11/2017 2:44 PM

## 2017-07-11 NOTE — Progress Notes (Signed)
CRITICAL VALUE ALERT  Critical Value:  PH 7.31 & CO2 64  Date & Time Notied:  07/11/17 15:55  Provider Notified: Dr. Isidoro Donningai  Orders Received/Actions taken:

## 2017-07-11 NOTE — Progress Notes (Addendum)
[facility]                                                                              Patient Demographics  Jesus [name], is a 71 y.o. male, DOB - 07-Aug-1946, ZOX:[id number]  Admit date - 07/07/2017   Admitting Physician Eduard Clos, MD  Outpatient Primary MD for the patient is Gerre Pebbles, [name]  Outpatient specialists:   LOS - 2  days   Medical records reviewed and are as summarized below:    Chief Complaint  Patient presents with  . Abnormal Lab  . Altered Mental Status  . Rash       Brief summary   71 year old with history of diastolic heart failure, atrial fibrillation, anemia, liver services, brought to the emergency department with complaints of low hemoglobin. Admitted for MRSA bacteremia and anemia. Infectious disease consulted and appreciated. Palliative care also following.   Assessment & Plan    MRSA bacteremia, in the setting of bullous pemphigoid - ID following, patient will need 6 weeks of IV vancomycin if the patient is to be treated aggressively and will need PICC line - 2-D echo EF 55-60% - Currently on IV vancomycin - Palliative medicine following closely, family conflict with 3 daughters requesting transition to hospice and comfort measures however patient's son wants to continue with full aggressive care.  - Will await for further decisions from family and then decide management regarding disposition  Anemia of chronic illness - FOBT negative, patient has received 3 units packed RBCs for hemoglobin of 6.7 - Currently H&H stable, last checked on 8/3 was 8.5  Blistering skin rash consistent with possible bullous pemphigoid - Continue wound care, continue prednisone  Acute encephalopathy in the setting of dementia - On examination, patient lethargic, only opens eyes to his name and falls back to sleep - Patient currently on Xanax, Norco, morphine (morphine was increased yesterday for pain control) - Gave Narcan 0.1  mg x1, follow ammonia level  - Continue Namenda, Zyprexa - Noticed that patient received a Xanax, Norco, Neurontin at 2155. Will DC Xanax, hold Neurontin dose tonight, place on Ativan low-dose as needed, gave Narcan again, obtain ABG - Ammonia level 78, patient is currently somnolent and lethargic, unable to take oral lactulose, can't give lactulose enema sec to stage 4 sacral ulcer Addendum No sig improvement after narcan 04mg   ABG pH 7.3/ pCO2 64/ pO2 136 - will place on Bipap. Transfer to SDU.  - repeat ABG 2 hrs after Bipap  - dc'ed neurontin and xanax. decreased norco and morphine  Sacral decub ulcers, stage IV Cont wound care  Chronic atrial fibrillation - Heart rate controlled, continue eliquis  Acute kidney injury - baseline Cr 0.9-1, upto 1.4 - Follow BMET closely  Essential hypertension - Continue Coreg  Code Status: DNR DVT Prophylaxis:  eliquis  Family Communication: Discussed in detail with patient's son, Mr. Ricky Stabs  Disposition Plan:   Time Spent in minutes  25 minutes  Procedures:  2d ECHO Impressions:  - Technically difficult study with poor acoustic windows. The   patient was in atrial fibrillation. Normal LV size with moderate   LV  hypertrophy. EF 55-60%. Mildly dilated RV with normal systolic   function. Biatrial enlargement. Dilated IVC suggestive of   elevated RV filling pressure.  Consultants:   Palliative care Infectious disease  Antimicrobials:      Medications  Scheduled Meds: . apixaban  5 mg Oral BID  . atorvastatin  80 mg Oral Daily  . carvedilol  12.5 mg Oral BID WC  . Chlorhexidine Gluconate Cloth  6 each Topical Q0600  . divalproex  250 mg Oral TID  . feeding supplement (PRO-STAT SUGAR FREE 64)  30 mL Oral Q6H  . gabapentin  300 mg Oral TID  . loratadine  10 mg Oral Daily  . memantine  5 mg Oral BID  . multivitamin with minerals  1 tablet Oral Daily  . mupirocin ointment  1 application Nasal BID  . naloxone      .  OLANZapine zydis  5 mg Oral QHS  . potassium chloride SA  20 mEq Oral BID  . predniSONE  5 mg Oral Q breakfast  . thiamine  100 mg Oral Daily   Continuous Infusions: . vancomycin Stopped (07/10/17 2325)   PRN Meds:.acetaminophen **OR** acetaminophen, ALPRAZolam, diphenhydrAMINE, HYDROcodone-acetaminophen, morphine injection, ondansetron **OR** ondansetron (ZOFRAN) IV   Antibiotics   Anti-infectives    Start     Dose/Rate Route Frequency Ordered Stop   07/09/17 1800  vancomycin (VANCOCIN) 1,250 mg in sodium chloride 0.9 % 250 mL IVPB     1,250 mg 166.7 mL/hr over 90 Minutes Intravenous Every 24 hours 07/08/17 1606     07/08/17 1700  vancomycin (VANCOCIN) 2,000 mg in sodium chloride 0.9 % 500 mL IVPB     2,000 mg 250 mL/hr over 120 Minutes Intravenous  Once 07/08/17 1606 07/08/17 2249        Subjective:   Jesus [name] was seen and examined today.  Lethargic, only opens eyes to name and sternal rub. Falls back to sleep. No fevers or chills.  Objective:   Vitals:   07/11/17 0456 07/11/17 0815 07/11/17 0907 07/11/17 0917  BP: 110/64 (!) 143/90 (!) 110/55   Pulse: 66 90 (!) 59   Resp: 18 (!) 22 (!) 32   Temp: 98.4 F (36.9 C) 99 F (37.2 C) 99.7 F (37.6 C)   TempSrc: Oral Oral Oral   SpO2: 95% 90% (!) 82% 92%  Weight:      Height:        Intake/Output Summary (Last 24 hours) at 07/11/17 1322 Last data filed at 07/11/17 0800  Gross per 24 hour  Intake               60 ml  Output              500 ml  Net             -440 ml     Wt Readings from Last 3 Encounters:  07/10/17 117.8 kg (259 lb 11.2 oz)  04/17/17 117.2 kg (258 lb 6.4 oz)  04/14/17 111.7 kg (246 lb 3.2 oz)     Exam  General: Somnolent and lethargic, not following any commands  Eyes:   HEENT:  Atraumatic, normocephalic  Cardiovascular: S1 S2 auscultated, no rubs, murmurs or gallops. Regular rate and rhythm.  Respiratory: Clear to auscultation bilaterally, anteriorly  Gastrointestinal:  Soft, nontender, nondistended, + bowel sounds  Ext: no pedal edema bilaterally  Neuro: unable to assess  Musculoskeletal: No digital cyanosis, clubbing  Skin: Diffuse blistering rash all over the body  Psych:  somnolent   Data Reviewed:  I have personally reviewed following labs and imaging studies  Micro Results Recent Results (from the past 240 hour(s))  Blood culture (routine x 2)     Status: Abnormal   Collection Time: 07/07/17  7:55 PM  Result Value Ref Range Status   Specimen Description BLOOD RIGHT HAND  Final   Special Requests   Final    BOTTLES DRAWN AEROBIC AND ANAEROBIC Blood Culture adequate volume   Culture  Setup Time   Final    GRAM POSITIVE COCCI IN CLUSTERS AEROBIC BOTTLE ONLY CRITICAL RESULT CALLED TO, READ BACK BY AND VERIFIED WITH: Artelia Laroche Pharm.D. 14:55 07/08/17 ([email or web address])    Culture METHICILLIN RESISTANT STAPHYLOCOCCUS AUREUS (A)  Final   Report Status 07/10/2017 FINAL  Final   Organism ID, Bacteria METHICILLIN RESISTANT STAPHYLOCOCCUS AUREUS  Final      Susceptibility   Methicillin resistant staphylococcus aureus - MIC*    CIPROFLOXACIN >=8 RESISTANT Resistant     ERYTHROMYCIN >=8 RESISTANT Resistant     GENTAMICIN <=0.5 SENSITIVE Sensitive     OXACILLIN >=4 RESISTANT Resistant     TETRACYCLINE <=1 SENSITIVE Sensitive     VANCOMYCIN <=0.5 SENSITIVE Sensitive     TRIMETH/SULFA >=320 RESISTANT Resistant     CLINDAMYCIN <=0.25 SENSITIVE Sensitive     RIFAMPIN <=0.5 SENSITIVE Sensitive     Inducible Clindamycin NEGATIVE Sensitive     * METHICILLIN RESISTANT STAPHYLOCOCCUS AUREUS  Blood Culture ID Panel (Reflexed)     Status: Abnormal   Collection Time: 07/07/17  7:55 PM  Result Value Ref Range Status   Enterococcus species NOT DETECTED NOT DETECTED Final   Listeria monocytogenes NOT DETECTED NOT DETECTED Final   Staphylococcus species DETECTED (A) NOT DETECTED Final    Comment: CRITICAL RESULT CALLED TO, READ BACK BY AND VERIFIED WITH: MMayford Knife  Pharm.D. 14:55 07/08/17 ([email or web address])    Staphylococcus aureus DETECTED (A) NOT DETECTED Final    Comment: Methicillin (oxacillin)-resistant Staphylococcus aureus (MRSA). MRSA is predictably resistant to beta-lactam antibiotics (except ceftaroline). Preferred therapy is vancomycin unless clinically contraindicated. Patient requires contact precautions if  hospitalized. CRITICAL RESULT CALLED TO, READ BACK BY AND VERIFIED WITH: M. Mayford Knife Pharm.D. 14:55 07/08/17 ([email or web address])    Methicillin resistance DETECTED (A) NOT DETECTED Final    Comment: CRITICAL RESULT CALLED TO, READ BACK BY AND VERIFIED WITH: M. Mayford Knife Pharm.D. 14:55 07/08/17 ([email or web address])    Streptococcus species NOT DETECTED NOT DETECTED Final   Streptococcus agalactiae NOT DETECTED NOT DETECTED Final   Streptococcus pneumoniae NOT DETECTED NOT DETECTED Final   Streptococcus pyogenes NOT DETECTED NOT DETECTED Final   Acinetobacter baumannii NOT DETECTED NOT DETECTED Final   Enterobacteriaceae species NOT DETECTED NOT DETECTED Final   Enterobacter cloacae complex NOT DETECTED NOT DETECTED Final   Escherichia coli NOT DETECTED NOT DETECTED Final   Klebsiella oxytoca NOT DETECTED NOT DETECTED Final   Klebsiella pneumoniae NOT DETECTED NOT DETECTED Final   Proteus species NOT DETECTED NOT DETECTED Final   Serratia marcescens NOT DETECTED NOT DETECTED Final   Haemophilus influenzae NOT DETECTED NOT DETECTED Final   Neisseria meningitidis NOT DETECTED NOT DETECTED Final   Pseudomonas aeruginosa NOT DETECTED NOT DETECTED Final   Candida albicans NOT DETECTED NOT DETECTED Final   Candida glabrata NOT DETECTED NOT DETECTED Final   Candida krusei NOT DETECTED NOT DETECTED Final   Candida parapsilosis NOT DETECTED NOT DETECTED Final   Candida tropicalis NOT DETECTED NOT DETECTED Final  Blood culture (routine x 2)  Status: None (Preliminary result)   Collection Time: 07/07/17  8:42 PM  Result Value Ref Range Status   Specimen Description BLOOD  RIGHT ANTECUBITAL  Final   Special Requests   Final    BOTTLES DRAWN AEROBIC AND ANAEROBIC Blood Culture adequate volume   Culture NO GROWTH 3 DAYS  Final   Report Status PENDING  Incomplete  MRSA PCR Screening     Status: Abnormal   Collection Time: 07/08/17 12:45 AM  Result Value Ref Range Status   MRSA by PCR POSITIVE (A) NEGATIVE Final    Comment:        The GeneXpert MRSA Assay (FDA approved for NASAL specimens only), is one component of a comprehensive MRSA colonization surveillance program. It is not intended to diagnose MRSA infection nor to guide or monitor treatment for MRSA infections. RESULT CALLED TO, READ BACK BY AND VERIFIED WITH: Barbaraann Cao CASTRO RN [phone] 07/08/17 A BROWNING   Culture, blood (Routine X 2) w Reflex to ID Panel     Status: None (Preliminary result)   Collection Time: 07/09/17  8:23 AM  Result Value Ref Range Status   Specimen Description BLOOD RIGHT HAND  Final   Special Requests   Final    BOTTLES DRAWN AEROBIC ONLY Blood Culture results may not be optimal due to an inadequate volume of blood received in culture bottles   Culture NO GROWTH 1 DAY  Final   Report Status PENDING  Incomplete  Culture, blood (Routine X 2) w Reflex to ID Panel     Status: None (Preliminary result)   Collection Time: 07/09/17  8:28 AM  Result Value Ref Range Status   Specimen Description BLOOD RIGHT HAND  Final   Special Requests   Final    BOTTLES DRAWN AEROBIC ONLY Blood Culture results may not be optimal due to an inadequate volume of blood received in culture bottles   Culture NO GROWTH 1 DAY  Final   Report Status PENDING  Incomplete    Radiology Reports No results found.  Lab Data:  CBC:  Recent Labs Lab 07/07/17 1955 07/08/17 0413 07/09/17 0758 07/10/17 0517  WBC 6.6 6.7 8.4 8.7  HGB 6.7* 7.4* 8.5* 8.5*  HCT 21.8* 23.5* 26.7* 27.2*  MCV 94.0 92.9 93.0 93.2  PLT 269 254 223 234   Basic Metabolic Panel:  Recent Labs Lab 07/07/17 1955 07/08/17 0413  07/09/17 0758 07/10/17 0517  NA 136 138 138 139  K 4.0 3.8 3.9 4.3  CL 96* 97* 98* 101  CO2 31 31 30 30   GLUCOSE 151* 86 116* 75  BUN 33* 33* 26* 26*  CREATININE 1.41* 1.40* 1.21 1.32*  CALCIUM 8.4* 8.6* 8.6* 8.8*   GFR: Estimated Creatinine Clearance: 66.5 mL/min (A) (by C-G formula based on SCr of 1.32 mg/dL (H)). Liver Function Tests:  Recent Labs Lab 07/07/17 1955 07/10/17 0517  AST 12* 10*  ALT 7* 9*  ALKPHOS 113 107  BILITOT 0.6 0.6  PROT 5.0* 5.2*  ALBUMIN 2.0* 2.0*   No results for input(s): LIPASE, AMYLASE in the last 168 hours.  Recent Labs Lab 07/10/17 1041 07/11/17 1215  AMMONIA 43* 78*   Coagulation Profile:  Recent Labs Lab 07/10/17 1214  INR 1.45   Cardiac Enzymes: No results for input(s): CKTOTAL, CKMB, CKMBINDEX, TROPONINI in the last 168 hours. BNP (last 3 results) No results for input(s): PROBNP in the last 8760 hours. HbA1C: No results for input(s): HGBA1C in the last 72 hours. CBG:  Recent Labs Lab  07/07/17 2010  GLUCAP 116*   Lipid Profile: No results for input(s): CHOL, HDL, LDLCALC, TRIG, CHOLHDL, LDLDIRECT in the last 72 hours. Thyroid Function Tests: No results for input(s): TSH, T4TOTAL, FREET4, T3FREE, THYROIDAB in the last 72 hours. Anemia Panel:  Recent Labs  07/08/17 1429  VITAMINB12 293  FOLATE 16.0  FERRITIN 316  TIBC 178*  IRON 24*  RETICCTPCT 2.1   Urine analysis:    Component Value Date/Time   COLORURINE AMBER (A) 03/12/2017 1238   APPEARANCEUR CLOUDY (A) 03/12/2017 1238   LABSPEC 1.018 03/12/2017 1238   PHURINE 5.0 03/12/2017 1238   GLUCOSEU NEGATIVE 03/12/2017 1238   HGBUR MODERATE (A) 03/12/2017 1238   BILIRUBINUR NEGATIVE 03/12/2017 1238   KETONESUR NEGATIVE 03/12/2017 1238   PROTEINUR 100 (A) 03/12/2017 1238   UROBILINOGEN 4.0 (H) 06/02/2012 1200   NITRITE NEGATIVE 03/12/2017 1238   LEUKOCYTESUR LARGE (A) 03/12/2017 1238     Edwardo Wojnarowski M.D. [facility] 07/11/2017, 1:22  PM  Pager: [phone] Between 7am to 7pm - call Pager - [phone]  After 7pm go to [email or web address] - password [email or web address]  Call night coverage person covering after 7pm

## 2017-07-11 NOTE — Progress Notes (Addendum)
No change in patient's mental status from this morning.  Grimaces to pain.  Ammonia level 78. Unable to give PO medication d/t mentation.  Dr. Isidoro Donningai notified via text page.

## 2017-07-11 NOTE — Progress Notes (Addendum)
Assisted in transferring patient to 3W for BIPAP support, patient is resting comfortably on BIPAP, will transfer to 3W once bed is ready.  Patient did arouse to verbal stimuli, nodded appropriately to voice commands/questions, tolerating BIPAP well.

## 2017-07-11 NOTE — Progress Notes (Signed)
Called by RN to assist with patient needing Bipap.  RT bringing machine to bedside.  Patient with change in mental status - ABG at 1530 showing hypercarbia.  Placed on Bipap per RT - patient opens eyes and grimaces to pain - transfer orders to SDU-  Not candidate for intubation per code status - will monitor til transfer.  Handoff to Puja RN - nighttime RRT RN.

## 2017-07-11 NOTE — Progress Notes (Signed)
Attempted to call report

## 2017-07-12 LAB — BASIC METABOLIC PANEL
Anion gap: 7 (ref 5–15)
BUN: 32 mg/dL — ABNORMAL HIGH (ref 6–20)
CALCIUM: 8.9 mg/dL (ref 8.9–10.3)
CHLORIDE: 103 mmol/L (ref 101–111)
CO2: 31 mmol/L (ref 22–32)
CREATININE: 1.44 mg/dL — AB (ref 0.61–1.24)
GFR, EST AFRICAN AMERICAN: 55 mL/min — AB (ref >60)
GFR, EST NON AFRICAN AMERICAN: 47 mL/min — AB (ref >60)
Glucose, Bld: 102 mg/dL — ABNORMAL HIGH (ref 65–99)
Potassium: 4.5 mmol/L (ref 3.5–5.1)
SODIUM: 141 mmol/L (ref 135–145)

## 2017-07-12 LAB — BLOOD GAS, ARTERIAL
ACID-BASE EXCESS: 5.7 mmol/L — AB (ref 0.0–2.0)
BICARBONATE: 31.4 mmol/L — AB (ref 20.0–28.0)
Drawn by: 270221
O2 Content: 3 L/min
O2 Saturation: 97.5 %
PCO2 ART: 60.6 mmHg — AB (ref 32.0–48.0)
PH ART: 7.334 — AB (ref 7.350–7.450)
Patient temperature: 98.6
pO2, Arterial: 103 mmHg (ref 83.0–108.0)

## 2017-07-12 LAB — CULTURE, BLOOD (ROUTINE X 2)
Culture: NO GROWTH
Special Requests: ADEQUATE

## 2017-07-12 MED ORDER — SODIUM CHLORIDE 0.9 % IV SOLN
INTRAVENOUS | Status: DC
  Administered 2017-07-12 – 2017-07-13 (×2): via INTRAVENOUS

## 2017-07-12 NOTE — Progress Notes (Signed)
[facility]                                                                              Patient Demographics  Jesus [name], is a 71 y.o. male, DOB - 10-30-1946, ZOX:[id number]  Admit date - 07/07/2017   Admitting Physician Eduard Clos, MD  Outpatient Primary MD for the patient is Gerre Pebbles, [name]  Outpatient specialists:   LOS - 3  days   Medical records reviewed and are as summarized below:    Chief Complaint  Patient presents with  . Abnormal Lab  . Altered Mental Status  . Rash       Brief summary   71 year old with history of diastolic heart failure, atrial fibrillation, anemia, liver services, brought to the emergency department with complaints of low hemoglobin. Admitted for MRSA bacteremia and anemia. Infectious disease consulted and appreciated. Palliative care also following.   Assessment & Plan    MRSA bacteremia, in the setting of bullous pemphigoid - ID following, patient will need 6 weeks of IV vancomycin if the patient is to be treated aggressively and will need PICC line - 2-D echo EF 55-60% - Currently on IV vancomycin - Palliative medicine following closely, family conflict with 3 daughters requesting transition to hospice and comfort measures however patient's son wants to continue with full aggressive care.  - Will await for further decisions from family and then decide management regarding disposition  Anemia of chronic illness - FOBT negative, patient has received 3 units packed RBCs for hemoglobin of 6.7 - Currently H&H stable, last checked on 8/3 was 8.5  Blistering skin rash consistent with possible bullous pemphigoid - Continue wound care, continue prednisone  Acute encephalopathy in the setting of dementia - Patient more alert and awake today, after medication adjustments, discontinue Neurontin, Xanax, decreased her Norco, morphine.  - Needed Narcan, was placed on BiPAP yesterday.  - Patient took off the  BiPAP last night after being agitated. - Today alert, oriented 2, somewhat agitated. I had discussed with patient's son yesterday in detail regarding overall guarded prognosis.  Sacral decub ulcers, stage IV Cont wound care  Chronic atrial fibrillation - Heart rate controlled, continue eliquis  Acute kidney injury - baseline Cr 0.9-1, upto 1.4 -  will place on gentle hydration until goals of care are established  Essential hypertension - Continue Coreg  Code Status: DNR DVT Prophylaxis:  eliquis  Family Communication: Discussed in detail with patient's son, Mr. Ricky Stabs  Disposition Plan:   Time Spent in minutes  25 minutes  Procedures:  2d ECHO Impressions:  - Technically difficult study with poor acoustic windows. The   patient was in atrial fibrillation. Normal LV size with moderate   LV hypertrophy. EF 55-60%. Mildly dilated RV with normal systolic   function. Biatrial enlargement. Dilated IVC suggestive of   elevated RV filling pressure.  Consultants:   Palliative care Infectious disease  Antimicrobials:      Medications  Scheduled Meds: . apixaban  5 mg Oral BID  . atorvastatin  80 mg Oral Daily  . carvedilol  12.5 mg Oral BID WC  . Chlorhexidine Gluconate Cloth  6 each Topical Q0600  . divalproex  250 mg Oral TID  . feeding supplement (PRO-STAT SUGAR FREE 64)  30 mL Oral Q6H  . loratadine  10 mg Oral Daily  . memantine  5 mg Oral BID  . multivitamin with minerals  1 tablet Oral Daily  . mupirocin ointment  1 application Nasal BID  . OLANZapine zydis  5 mg Oral QHS  . potassium chloride SA  20 mEq Oral BID  . predniSONE  5 mg Oral Q breakfast  . thiamine  100 mg Oral Daily   Continuous Infusions: . vancomycin Stopped (07/11/17 1848)   PRN Meds:.acetaminophen **OR** acetaminophen, diphenhydrAMINE, HYDROcodone-acetaminophen, LORazepam, morphine injection, ondansetron **OR** ondansetron (ZOFRAN) IV   Antibiotics   Anti-infectives    Start      Dose/Rate Route Frequency Ordered Stop   07/09/17 1800  vancomycin (VANCOCIN) 1,250 mg in sodium chloride 0.9 % 250 mL IVPB     1,250 mg 166.7 mL/hr over 90 Minutes Intravenous Every 24 hours 07/08/17 1606     07/08/17 1700  vancomycin (VANCOCIN) 2,000 mg in sodium chloride 0.9 % 500 mL IVPB     2,000 mg 250 mL/hr over 120 Minutes Intravenous  Once 07/08/17 1606 07/08/17 2249        Subjective:   Jesus [name] was seen and examined today. Alert and oriented 2, still somewhat confused, yelling "help me", took off his BiPAP overnight. No fevers or chills. Denies any chest pain or shortness of breath, O2 sats 94%.  Objective:   Vitals:   07/11/17 2142 07/12/17 0028 07/12/17 0420 07/12/17 0946  BP:  117/66 117/76 (!) 142/67  Pulse:  81 83 88  Resp:  (!) 24 20   Temp: 99.6 F (37.6 C) 99.1 F (37.3 C) 99.6 F (37.6 C)   TempSrc: Oral Oral Oral   SpO2:  100% 91%   Weight:   125.6 kg (277 lb)   Height:        Intake/Output Summary (Last 24 hours) at 07/12/17 1235 Last data filed at 07/12/17 0855  Gross per 24 hour  Intake                0 ml  Output             1125 ml  Net            -1125 ml     Wt Readings from Last 3 Encounters:  07/12/17 125.6 kg (277 lb)  04/17/17 117.2 kg (258 lb 6.4 oz)  04/14/17 111.7 kg (246 lb 3.2 oz)     Exam   General: Alert and oriented x 2, agitated, yelling  Eyes:   HEENT:   Cardiovascular: S1 S2 auscultated, no rubs, murmurs or gallops. Regular rate and rhythm  Respiratory: Clear to auscultation bilaterally, no wheezing, rales or rhonchi  Gastrointestinal: Soft, nontender, nondistended, + bowel sounds  Ext: 1+ pedal edema bilaterally  Neuro: not following commands,   Musculoskeletal: No digital cyanosis, clubbing  Skin: Diffuse blistering rash all over the body in different stages of healing  Psych: confused alert and oriented 2 (self, place, knows he is in [place])   Data Reviewed:  I have personally  reviewed following labs and imaging studies  Micro Results Recent Results (from the past 240 hour(s))  Blood culture (routine x 2)     Status: Abnormal   Collection Time: 07/07/17  7:55 PM  Result Value Ref Range Status   Specimen Description BLOOD RIGHT HAND  Final  Special Requests   Final    BOTTLES DRAWN AEROBIC AND ANAEROBIC Blood Culture adequate volume   Culture  Setup Time   Final    GRAM POSITIVE COCCI IN CLUSTERS AEROBIC BOTTLE ONLY CRITICAL RESULT CALLED TO, READ BACK BY AND VERIFIED WITH: M. Mayford Knifeurner Pharm.D. 14:55 07/08/17 ([email or web address])    Culture METHICILLIN RESISTANT STAPHYLOCOCCUS AUREUS (A)  Final   Report Status 07/10/2017 FINAL  Final   Organism ID, Bacteria METHICILLIN RESISTANT STAPHYLOCOCCUS AUREUS  Final      Susceptibility   Methicillin resistant staphylococcus aureus - MIC*    CIPROFLOXACIN >=8 RESISTANT Resistant     ERYTHROMYCIN >=8 RESISTANT Resistant     GENTAMICIN <=0.5 SENSITIVE Sensitive     OXACILLIN >=4 RESISTANT Resistant     TETRACYCLINE <=1 SENSITIVE Sensitive     VANCOMYCIN <=0.5 SENSITIVE Sensitive     TRIMETH/SULFA >=320 RESISTANT Resistant     CLINDAMYCIN <=0.25 SENSITIVE Sensitive     RIFAMPIN <=0.5 SENSITIVE Sensitive     Inducible Clindamycin NEGATIVE Sensitive     * METHICILLIN RESISTANT STAPHYLOCOCCUS AUREUS  Blood Culture ID Panel (Reflexed)     Status: Abnormal   Collection Time: 07/07/17  7:55 PM  Result Value Ref Range Status   Enterococcus species NOT DETECTED NOT DETECTED Final   Listeria monocytogenes NOT DETECTED NOT DETECTED Final   Staphylococcus species DETECTED (A) NOT DETECTED Final    Comment: CRITICAL RESULT CALLED TO, READ BACK BY AND VERIFIED WITH: MMayford Knife. Turner Pharm.D. 14:55 07/08/17 ([email or web address])    Staphylococcus aureus DETECTED (A) NOT DETECTED Final    Comment: Methicillin (oxacillin)-resistant Staphylococcus aureus (MRSA). MRSA is predictably resistant to beta-lactam antibiotics (except ceftaroline). Preferred therapy is  vancomycin unless clinically contraindicated. Patient requires contact precautions if  hospitalized. CRITICAL RESULT CALLED TO, READ BACK BY AND VERIFIED WITH: M. Mayford Knifeurner Pharm.D. 14:55 07/08/17 ([email or web address])    Methicillin resistance DETECTED (A) NOT DETECTED Final    Comment: CRITICAL RESULT CALLED TO, READ BACK BY AND VERIFIED WITH: M. Mayford Knifeurner Pharm.D. 14:55 07/08/17 ([email or web address])    Streptococcus species NOT DETECTED NOT DETECTED Final   Streptococcus agalactiae NOT DETECTED NOT DETECTED Final   Streptococcus pneumoniae NOT DETECTED NOT DETECTED Final   Streptococcus pyogenes NOT DETECTED NOT DETECTED Final   Acinetobacter baumannii NOT DETECTED NOT DETECTED Final   Enterobacteriaceae species NOT DETECTED NOT DETECTED Final   Enterobacter cloacae complex NOT DETECTED NOT DETECTED Final   Escherichia coli NOT DETECTED NOT DETECTED Final   Klebsiella oxytoca NOT DETECTED NOT DETECTED Final   Klebsiella pneumoniae NOT DETECTED NOT DETECTED Final   Proteus species NOT DETECTED NOT DETECTED Final   Serratia marcescens NOT DETECTED NOT DETECTED Final   Haemophilus influenzae NOT DETECTED NOT DETECTED Final   Neisseria meningitidis NOT DETECTED NOT DETECTED Final   Pseudomonas aeruginosa NOT DETECTED NOT DETECTED Final   Candida albicans NOT DETECTED NOT DETECTED Final   Candida glabrata NOT DETECTED NOT DETECTED Final   Candida krusei NOT DETECTED NOT DETECTED Final   Candida parapsilosis NOT DETECTED NOT DETECTED Final   Candida tropicalis NOT DETECTED NOT DETECTED Final  Blood culture (routine x 2)     Status: None (Preliminary result)   Collection Time: 07/07/17  8:42 PM  Result Value Ref Range Status   Specimen Description BLOOD RIGHT ANTECUBITAL  Final   Special Requests   Final    BOTTLES DRAWN AEROBIC AND ANAEROBIC Blood Culture adequate volume   Culture NO GROWTH 4 DAYS  Final   Report Status PENDING  Incomplete  MRSA PCR Screening     Status: Abnormal   Collection Time: 07/08/17 12:45  AM  Result Value Ref Range Status   MRSA by PCR POSITIVE (A) NEGATIVE Final    Comment:        The GeneXpert MRSA Assay (FDA approved for NASAL specimens only), is one component of a comprehensive MRSA colonization surveillance program. It is not intended to diagnose MRSA infection nor to guide or monitor treatment for MRSA infections. RESULT CALLED TO, READ BACK BY AND VERIFIED WITH: Barbaraann Cao CASTRO RN [phone] 07/08/17 A BROWNING   Culture, blood (Routine X 2) w Reflex to ID Panel     Status: None (Preliminary result)   Collection Time: 07/09/17  8:23 AM  Result Value Ref Range Status   Specimen Description BLOOD RIGHT HAND  Final   Special Requests   Final    BOTTLES DRAWN AEROBIC ONLY Blood Culture results may not be optimal due to an inadequate volume of blood received in culture bottles   Culture NO GROWTH 2 DAYS  Final   Report Status PENDING  Incomplete  Culture, blood (Routine X 2) w Reflex to ID Panel     Status: None (Preliminary result)   Collection Time: 07/09/17  8:28 AM  Result Value Ref Range Status   Specimen Description BLOOD RIGHT HAND  Final   Special Requests   Final    BOTTLES DRAWN AEROBIC ONLY Blood Culture results may not be optimal due to an inadequate volume of blood received in culture bottles   Culture NO GROWTH 2 DAYS  Final   Report Status PENDING  Incomplete    Radiology Reports No results found.  Lab Data:  CBC:  Recent Labs Lab 07/07/17 1955 07/08/17 0413 07/09/17 0758 07/10/17 0517 07/11/17 1516  WBC 6.6 6.7 8.4 8.7 9.6  HGB 6.7* 7.4* 8.5* 8.5* 8.4*  HCT 21.8* 23.5* 26.7* 27.2* 27.5*  MCV 94.0 92.9 93.0 93.2 95.8  PLT 269 254 223 234 237   Basic Metabolic Panel:  Recent Labs Lab 07/08/17 0413 07/09/17 0758 07/10/17 0517 07/11/17 1516 07/12/17 0311  NA 138 138 139 139 141  K 3.8 3.9 4.3 4.7 4.5  CL 97* 98* 101 101 103  CO2 31 30 30  32 31  GLUCOSE 86 116* 75 67 102*  BUN 33* 26* 26* 29* 32*  CREATININE 1.40* 1.21 1.32* 1.36*  1.44*  CALCIUM 8.6* 8.6* 8.8* 9.0 8.9   GFR: Estimated Creatinine Clearance: 63.1 mL/min (A) (by C-G formula based on SCr of 1.44 mg/dL (H)). Liver Function Tests:  Recent Labs Lab 07/07/17 1955 07/10/17 0517  AST 12* 10*  ALT 7* 9*  ALKPHOS 113 107  BILITOT 0.6 0.6  PROT 5.0* 5.2*  ALBUMIN 2.0* 2.0*   No results for input(s): LIPASE, AMYLASE in the last 168 hours.  Recent Labs Lab 07/10/17 1041 07/11/17 1215  AMMONIA 43* 78*   Coagulation Profile:  Recent Labs Lab 07/10/17 1214  INR 1.45   Cardiac Enzymes: No results for input(s): CKTOTAL, CKMB, CKMBINDEX, TROPONINI in the last 168 hours. BNP (last 3 results) No results for input(s): PROBNP in the last 8760 hours. HbA1C: No results for input(s): HGBA1C in the last 72 hours. CBG:  Recent Labs Lab 07/07/17 2010  GLUCAP 116*   Lipid Profile: No results for input(s): CHOL, HDL, LDLCALC, TRIG, CHOLHDL, LDLDIRECT in the last 72 hours. Thyroid Function Tests: No results for input(s): TSH, T4TOTAL, FREET4, T3FREE, THYROIDAB in the last 72 hours. Anemia Panel: No  results for input(s): VITAMINB12, FOLATE, FERRITIN, TIBC, IRON, RETICCTPCT in the last 72 hours. Urine analysis:    Component Value Date/Time   COLORURINE AMBER (A) 03/12/2017 1238   APPEARANCEUR CLOUDY (A) 03/12/2017 1238   LABSPEC 1.018 03/12/2017 1238   PHURINE 5.0 03/12/2017 1238   GLUCOSEU NEGATIVE 03/12/2017 1238   HGBUR MODERATE (A) 03/12/2017 1238   BILIRUBINUR NEGATIVE 03/12/2017 1238   KETONESUR NEGATIVE 03/12/2017 1238   PROTEINUR 100 (A) 03/12/2017 1238   UROBILINOGEN 4.0 (H) 06/02/2012 1200   NITRITE NEGATIVE 03/12/2017 1238   LEUKOCYTESUR LARGE (A) 03/12/2017 1238     Kathline Banbury M.D. [facility] 07/12/2017, 12:35 PM  Pager: [phone] Between 7am to 7pm - call Pager - [phone]  After 7pm go to [email or web address] - password [email or web address]  Call night coverage person covering after 7pm

## 2017-07-12 NOTE — Progress Notes (Signed)
Daily Progress Note   Patient Name: Jesus [name]       Date: 07/12/2017 DOB: February 27, 1946  Age: 71 y.o. MRN#: [id number] Attending Physician: Cathren Harsh, MD Primary Care Physician: Gerre Pebbles, [name] Admit Date: 07/07/2017  Reason for Consultation/Follow-up: Establishing goals of care, Interfamily conflict, Non pain symptom management, Pain control and Psychosocial/spiritual support  Subjective: Events of yesterday noted. Patient in bed calling out "Help me, please help me". Incontinent of stool. Resp therapist trying to draw ABG. Patient comforted with therapeutic touch. Patient not oriented to person, place or situation. Unable to verbalize needs. Appears in pain and discomfort.   Spoke with patient's son this morning. Answered questions related to balancing comfort needs, life prolonging measures, and overall trajectory and prognosis which remains quite poor.     ROS  Length of Stay: 3  Current Medications: Scheduled Meds:  . apixaban  5 mg Oral BID  . atorvastatin  80 mg Oral Daily  . carvedilol  12.5 mg Oral BID WC  . Chlorhexidine Gluconate Cloth  6 each Topical Q0600  . divalproex  250 mg Oral TID  . feeding supplement (PRO-STAT SUGAR FREE 64)  30 mL Oral Q6H  . loratadine  10 mg Oral Daily  . memantine  5 mg Oral BID  . multivitamin with minerals  1 tablet Oral Daily  . mupirocin ointment  1 application Nasal BID  . OLANZapine zydis  5 mg Oral QHS  . potassium chloride SA  20 mEq Oral BID  . predniSONE  5 mg Oral Q breakfast  . thiamine  100 mg Oral Daily    Continuous Infusions: . vancomycin Stopped (07/11/17 1848)    PRN Meds: acetaminophen **OR** acetaminophen, diphenhydrAMINE, HYDROcodone-acetaminophen, LORazepam, morphine injection, ondansetron **OR**  ondansetron (ZOFRAN) IV  Physical Exam  Constitutional: He appears well-developed and well-nourished. He appears distressed.  Abdominal: He exhibits distension.  Musculoskeletal:  Unable to move lower extremities  Neurological: He is alert.  Not oriented  Skin: Rash noted.  Diffuse bullae in multiple stages of healing  Nursing note and vitals reviewed.           Vital Signs: BP 117/76 (BP Location: Right Arm)   Pulse 83   Temp 99.6 F (37.6 C) (Oral)   Resp 20   Ht 5' 10.5" (1.791 m)  Wt 125.6 kg (277 lb)   SpO2 91%   BMI 39.18 kg/m  SpO2: SpO2: 91 % O2 Device: O2 Device: Not Delivered O2 Flow Rate:    Intake/output summary:  Intake/Output Summary (Last 24 hours) at 07/12/17 0852 Last data filed at 07/12/17 0421  Gross per 24 hour  Intake                0 ml  Output             1125 ml  Net            -1125 ml   LBM: Last BM Date: 07/09/17 Baseline Weight: Weight: 118.6 kg (261 lb 7.5 oz) Most recent weight: Weight: 125.6 kg (277 lb)       Palliative Assessment/Data: PPS:       Patient Active Problem List   Diagnosis Date Noted  . Hepatic cirrhosis (HCC)   . Advance care planning   . Staphylococcal sepsis (HCC) 07/09/2017  . Bullous pemphigoid   . MRSA bacteremia   . Normochromic normocytic anemia 07/07/2017  . Anemia 07/07/2017  . PNA (pneumonia) 03/23/2017  . Hypoxemia   . OSA on CPAP   . Acute respiratory failure with hypoxia and hypercapnia (HCC) 02/28/2017  . Acute pulmonary edema (HCC) 02/28/2017  . Sepsis secondary to UTI (HCC) 02/28/2017  . Goals of care, counseling/discussion   . Palliative care encounter   . Acute encephalopathy 02/24/2017  . Palliative care by specialist   . Decubitus ulcer of sacral region, unstageable (HCC) 12/30/2016  . Altered mental status 12/30/2016  . Hyponatremia 07/15/2015  . Hypokalemia   . Chronic diastolic CHF (congestive heart failure) (HCC) 04/02/2015  . Pulmonary vascular congestion   . Peripheral  edema   . 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 Renal artery stenosis (HCC) 02/17/2014  . Peripheral vascular disease (HCC) 04/15/2013  . Morbid obesity (HCC) 06/11/2012  . Essential hypertension 05/18/2012  . Atrial fibrillation (HCC) 04/27/2012    Palliative Care Assessment & Plan   Patient Profile: 71 y.o. male  with past medical history of dementia, OSA, HTN, CHF, A. Fib, hepatic cirrhosis, chronic anemia, non healing decubitis ulcers requiring surgical debridement, chronic venous insufficieny, chronic edema, lesions in liver by CT (thought to be cyst vs pseudocyst) admitted on 07/07/2017 with low hemoglobin and multiple blistering wounds over his body. Workup reveals blood cultures positive for MRSA. Patient has history of multiple admissions (3 in the last six months- last in March for AMS- hypoxia and hypercarbia, CHF exacerbation, sepsis) and poor functional status and medical noncompliance. Palliative medicine consulted for GOC.    Assessment/Recommendations/Plan   Continue full scope treatment  Discussed with [name] this morning- he is now also considering comfort measures- [facility] and she is going to contact me today.  Goals of Care and Additional Recommendations:  Limitations on Scope of Treatment: Full Scope Treatment  Code Status:  DNR  Prognosis:   Unable to determine likely weeks due to MRSA bacteremia, liver cirrhosis with encephalopathy, unhealing sacral wound- ?osteomyelitis, very poor functional, cognitive, nutritional status, OSA and refusal to wear bipap  Discharge Planning:  To Be Determined  Care plan was discussed with patient's son, Elta GuadeloupeJosh Edmonds.  Thank you for allowing the Palliative Medicine Team to assist in the care of this patient.   Total time: 60 minutes  Greater than 50%  of this time was spent counseling and coordinating care related to the above assessment and  plan.  Ocie BobKasie Mahan,  AGNP-C Palliative Medicine   Please contact Palliative Medicine Team phone at [phone] for questions and concerns.

## 2017-07-13 DIAGNOSIS — Z7189 Other specified counseling: Secondary | ICD-10-CM

## 2017-07-13 MED ORDER — SODIUM CHLORIDE 0.9 % IV SOLN
1000.0000 mg | INTRAVENOUS | Status: DC
  Administered 2017-07-13: 1000 mg via INTRAVENOUS
  Filled 2017-07-13 (×2): qty 20

## 2017-07-13 MED ORDER — ACETAMINOPHEN 325 MG PO TABS
650.0000 mg | ORAL_TABLET | Freq: Three times a day (TID) | ORAL | Status: DC
  Administered 2017-07-13 – 2017-07-14 (×2): 650 mg via ORAL
  Filled 2017-07-13 (×3): qty 2

## 2017-07-13 MED ORDER — DIPHENHYDRAMINE-ZINC ACETATE 2-0.1 % EX CREA
TOPICAL_CREAM | Freq: Three times a day (TID) | CUTANEOUS | Status: DC | PRN
  Administered 2017-07-13: 19:00:00 via TOPICAL
  Filled 2017-07-13: qty 28

## 2017-07-13 MED ORDER — LORAZEPAM 0.5 MG PO TABS
0.5000 mg | ORAL_TABLET | Freq: Four times a day (QID) | ORAL | Status: DC | PRN
  Administered 2017-07-13 – 2017-07-14 (×2): 0.5 mg via ORAL
  Filled 2017-07-13 (×2): qty 1

## 2017-07-13 MED ORDER — GABAPENTIN 100 MG PO CAPS
100.0000 mg | ORAL_CAPSULE | Freq: Two times a day (BID) | ORAL | Status: DC
  Administered 2017-07-13 – 2017-07-14 (×2): 100 mg via ORAL
  Filled 2017-07-13 (×3): qty 1

## 2017-07-13 MED ORDER — MORPHINE SULFATE (PF) 2 MG/ML IV SOLN
1.0000 mg | INTRAVENOUS | Status: DC | PRN
  Administered 2017-07-13 – 2017-07-15 (×6): 1 mg via INTRAVENOUS
  Filled 2017-07-13 (×6): qty 1

## 2017-07-13 NOTE — Progress Notes (Signed)
[facility]                                                                              Patient Demographics  Jesus [name], is a 71 y.o. male, DOB - 27-Jan-1946, HYQ:[id number]  Admit date - 07/07/2017   Admitting Physician Eduard ClosArshad N Kakrakandy, MD  Outpatient Primary MD for the patient is Gerre Pebblesavis, [name], [name]  Outpatient specialists:   LOS - 4  days   Medical records reviewed and are as summarized below:    Chief Complaint  Patient presents with  . Abnormal Lab  . Altered Mental Status  . Rash       Brief summary   71 year old with history of diastolic heart failure, atrial fibrillation, anemia, liver services, brought to the emergency department with complaints of low hemoglobin. Admitted for MRSA bacteremia and anemia. Infectious disease consulted and appreciated. Palliative care also following.   Assessment & Plan    MRSA bacteremia, in the setting of bullous pemphigoid - ID following, patient will need 6 weeks of IV vancomycin if the patient is to be treated aggressively and will need PICC line - 2-D echo EF 55-60% - Currently on IV vancomycin - Palliative medicine following closely, family conflict with 3 daughters requesting transition to hospice and comfort measures however patient's son wants to continue with full aggressive care.  - Will await for further decisions from family and then decide management regarding disposition  Anemia of chronic illness - FOBT negative, patient has received 3 units packed RBCs for hemoglobin of 6.7 - Currently H&H stable   Blistering skin rash consistent with possible bullous pemphigoid - Continue wound care, continue prednisone  Acute encephalopathy in the setting of dementia - Currently alert and awake, intermittently confused, and did not need BiPAP overnight  Sacral decub ulcers, stage IV Cont wound care  Chronic atrial fibrillation - Heart rate controlled, continue eliquis  Acute kidney injury -  baseline Cr 0.9-1, upto 1.4 -  will place on gentle hydration until goals of care are established  Essential hypertension - Continue Coreg  Code Status: DNR DVT Prophylaxis:  eliquis  Family Communication:   Disposition Plan: Awaiting palliative goals of care regarding disposition  Time Spent in minutes  15 minutes  Procedures:  2d ECHO Impressions:  - Technically difficult study with poor acoustic windows. The   patient was in atrial fibrillation. Normal LV size with moderate   LV hypertrophy. EF 55-60%. Mildly dilated RV with normal systolic   function. Biatrial enlargement. Dilated IVC suggestive of   elevated RV filling pressure.  Consultants:   Palliative care Infectious disease  Antimicrobials:      Medications  Scheduled Meds: . apixaban  5 mg Oral BID  . atorvastatin  80 mg Oral Daily  . carvedilol  12.5 mg Oral BID WC  . divalproex  250 mg Oral TID  . feeding supplement (PRO-STAT SUGAR FREE 64)  30 mL Oral Q6H  . loratadine  10 mg Oral Daily  . memantine  5 mg Oral BID  . multivitamin with minerals  1 tablet Oral Daily  . OLANZapine zydis  5 mg Oral QHS  .  predniSONE  5 mg Oral Q breakfast  . thiamine  100 mg Oral Daily   Continuous Infusions: . sodium chloride 75 mL/hr at 07/13/17 0208  . vancomycin Stopped (07/12/17 1934)   PRN Meds:.acetaminophen **OR** acetaminophen, diphenhydrAMINE, HYDROcodone-acetaminophen, LORazepam, morphine injection, ondansetron **OR** ondansetron (ZOFRAN) IV   Antibiotics   Anti-infectives    Start     Dose/Rate Route Frequency Ordered Stop   07/09/17 1800  vancomycin (VANCOCIN) 1,250 mg in sodium chloride 0.9 % 250 mL IVPB     1,250 mg 166.7 mL/hr over 90 Minutes Intravenous Every 24 hours 07/08/17 1606     07/08/17 1700  vancomycin (VANCOCIN) 2,000 mg in sodium chloride 0.9 % 500 mL IVPB     2,000 mg 250 mL/hr over 120 Minutes Intravenous  Once 07/08/17 1606 07/08/17 2249        Subjective:   Jesus [name]  Pals was seen and examined today. Still somewhat confused but alert and awake.  No fevers or chills. Denies any chest pain or shortness of breath, O2 sats 94%.  Objective:   Vitals:   07/13/17 0434 07/13/17 0644 07/13/17 0855 07/13/17 0950  BP:  (!) 113/55 (!) 82/66   Pulse:  82 71 (!) 25  Resp:  20 (!) 29 19  Temp:  98.2 F (36.8 C) 98.4 F (36.9 C)   TempSrc:  Oral Axillary   SpO2:  100% 99% 96%  Weight: 126.1 kg (278 lb) 127.5 kg (281 lb)    Height:        Intake/Output Summary (Last 24 hours) at 07/13/17 1215 Last data filed at 07/13/17 1610  Gross per 24 hour  Intake           2512.5 ml  Output             1650 ml  Net            862.5 ml     Wt Readings from Last 3 Encounters:  07/13/17 127.5 kg (281 lb)  04/17/17 117.2 kg (258 lb 6.4 oz)  04/14/17 111.7 kg (246 lb 3.2 oz)     Exam   General: Alert and oriented x2 , NAD, confused  Eyes:   HEENT:    Cardiovascular: S1 S2 auscultated, no rubs, murmurs or gallops. Regular rate and rhythm.   Respiratory: Clear to auscultation bilaterally, no wheezing, rales or rhonchi  Gastrointestinal: Soft, nontender, nondistended, + bowel sounds  Ext: 1+ pedal edema bilaterally  Neuro:   Musculoskeletal: No digital cyanosis, clubbing  Skin: Diffuse blistering rash all over the body in different stages of healing  Psych: confused,   Data Reviewed:  I have personally reviewed following labs and imaging studies  Micro Results Recent Results (from the past 240 hour(s))  Blood culture (routine x 2)     Status: Abnormal   Collection Time: 07/07/17  7:55 PM  Result Value Ref Range Status   Specimen Description BLOOD RIGHT HAND  Final   Special Requests   Final    BOTTLES DRAWN AEROBIC AND ANAEROBIC Blood Culture adequate volume   Culture  Setup Time   Final    GRAM POSITIVE COCCI IN CLUSTERS AEROBIC BOTTLE ONLY CRITICAL RESULT CALLED TO, READ BACK BY AND VERIFIED WITH: Artelia Laroche Pharm.D. 14:55 07/08/17  ([email or web address])    Culture METHICILLIN RESISTANT STAPHYLOCOCCUS AUREUS (A)  Final   Report Status 07/10/2017 FINAL  Final   Organism ID, Bacteria METHICILLIN RESISTANT STAPHYLOCOCCUS AUREUS  Final      Susceptibility   Methicillin  resistant staphylococcus aureus - MIC*    CIPROFLOXACIN >=8 RESISTANT Resistant     ERYTHROMYCIN >=8 RESISTANT Resistant     GENTAMICIN <=0.5 SENSITIVE Sensitive     OXACILLIN >=4 RESISTANT Resistant     TETRACYCLINE <=1 SENSITIVE Sensitive     VANCOMYCIN <=0.5 SENSITIVE Sensitive     TRIMETH/SULFA >=320 RESISTANT Resistant     CLINDAMYCIN <=0.25 SENSITIVE Sensitive     RIFAMPIN <=0.5 SENSITIVE Sensitive     Inducible Clindamycin NEGATIVE Sensitive     * METHICILLIN RESISTANT STAPHYLOCOCCUS AUREUS  Blood Culture ID Panel (Reflexed)     Status: Abnormal   Collection Time: 07/07/17  7:55 PM  Result Value Ref Range Status   Enterococcus species NOT DETECTED NOT DETECTED Final   Listeria monocytogenes NOT DETECTED NOT DETECTED Final   Staphylococcus species DETECTED (A) NOT DETECTED Final    Comment: CRITICAL RESULT CALLED TO, READ BACK BY AND VERIFIED WITH: MLoretha Brasil.D. 14:55 07/08/17 ([email or web address])    Staphylococcus aureus DETECTED (A) NOT DETECTED Final    Comment: Methicillin (oxacillin)-resistant Staphylococcus aureus (MRSA). MRSA is predictably resistant to beta-lactam antibiotics (except ceftaroline). Preferred therapy is vancomycin unless clinically contraindicated. Patient requires contact precautions if  hospitalized. CRITICAL RESULT CALLED TO, READ BACK BY AND VERIFIED WITH: M. Mayford Knife Pharm.D. 14:55 07/08/17 ([email or web address])    Methicillin resistance DETECTED (A) NOT DETECTED Final    Comment: CRITICAL RESULT CALLED TO, READ BACK BY AND VERIFIED WITH: M. Mayford Knife Pharm.D. 14:55 07/08/17 ([email or web address])    Streptococcus species NOT DETECTED NOT DETECTED Final   Streptococcus agalactiae NOT DETECTED NOT DETECTED Final   Streptococcus pneumoniae NOT DETECTED NOT DETECTED  Final   Streptococcus pyogenes NOT DETECTED NOT DETECTED Final   Acinetobacter baumannii NOT DETECTED NOT DETECTED Final   Enterobacteriaceae species NOT DETECTED NOT DETECTED Final   Enterobacter cloacae complex NOT DETECTED NOT DETECTED Final   Escherichia coli NOT DETECTED NOT DETECTED Final   Klebsiella oxytoca NOT DETECTED NOT DETECTED Final   Klebsiella pneumoniae NOT DETECTED NOT DETECTED Final   Proteus species NOT DETECTED NOT DETECTED Final   Serratia marcescens NOT DETECTED NOT DETECTED Final   Haemophilus influenzae NOT DETECTED NOT DETECTED Final   Neisseria meningitidis NOT DETECTED NOT DETECTED Final   Pseudomonas aeruginosa NOT DETECTED NOT DETECTED Final   Candida albicans NOT DETECTED NOT DETECTED Final   Candida glabrata NOT DETECTED NOT DETECTED Final   Candida krusei NOT DETECTED NOT DETECTED Final   Candida parapsilosis NOT DETECTED NOT DETECTED Final   Candida tropicalis NOT DETECTED NOT DETECTED Final  Blood culture (routine x 2)     Status: None   Collection Time: 07/07/17  8:42 PM  Result Value Ref Range Status   Specimen Description BLOOD RIGHT ANTECUBITAL  Final   Special Requests   Final    BOTTLES DRAWN AEROBIC AND ANAEROBIC Blood Culture adequate volume   Culture NO GROWTH 5 DAYS  Final   Report Status 07/12/2017 FINAL  Final  MRSA PCR Screening     Status: Abnormal   Collection Time: 07/08/17 12:45 AM  Result Value Ref Range Status   MRSA by PCR POSITIVE (A) NEGATIVE Final    Comment:        The GeneXpert MRSA Assay (FDA approved for NASAL specimens only), is one component of a comprehensive MRSA colonization surveillance program. It is not intended to diagnose MRSA infection nor to guide or monitor treatment for MRSA infections. RESULT CALLED TO, READ BACK BY AND VERIFIED WITH: E CASTRO RN  8295 07/08/17 A BROWNING   Culture, blood (Routine X 2) w Reflex to ID Panel     Status: None (Preliminary result)   Collection Time: 07/09/17  8:23 AM    Result Value Ref Range Status   Specimen Description BLOOD RIGHT HAND  Final   Special Requests   Final    BOTTLES DRAWN AEROBIC ONLY Blood Culture results may not be optimal due to an inadequate volume of blood received in culture bottles   Culture NO GROWTH 4 DAYS  Final   Report Status PENDING  Incomplete  Culture, blood (Routine X 2) w Reflex to ID Panel     Status: None (Preliminary result)   Collection Time: 07/09/17  8:28 AM  Result Value Ref Range Status   Specimen Description BLOOD RIGHT HAND  Final   Special Requests   Final    BOTTLES DRAWN AEROBIC ONLY Blood Culture results may not be optimal due to an inadequate volume of blood received in culture bottles   Culture NO GROWTH 4 DAYS  Final   Report Status PENDING  Incomplete    Radiology Reports No results found.  Lab Data:  CBC:  Recent Labs Lab 07/07/17 1955 07/08/17 0413 07/09/17 0758 07/10/17 0517 07/11/17 1516  WBC 6.6 6.7 8.4 8.7 9.6  HGB 6.7* 7.4* 8.5* 8.5* 8.4*  HCT 21.8* 23.5* 26.7* 27.2* 27.5*  MCV 94.0 92.9 93.0 93.2 95.8  PLT 269 254 223 234 237   Basic Metabolic Panel:  Recent Labs Lab 07/08/17 0413 07/09/17 0758 07/10/17 0517 07/11/17 1516 07/12/17 0311  NA 138 138 139 139 141  K 3.8 3.9 4.3 4.7 4.5  CL 97* 98* 101 101 103  CO2 31 30 30  32 31  GLUCOSE 86 116* 75 67 102*  BUN 33* 26* 26* 29* 32*  CREATININE 1.40* 1.21 1.32* 1.36* 1.44*  CALCIUM 8.6* 8.6* 8.8* 9.0 8.9   GFR: Estimated Creatinine Clearance: 63.6 mL/min (A) (by C-G formula based on SCr of 1.44 mg/dL (H)). Liver Function Tests:  Recent Labs Lab 07/07/17 1955 07/10/17 0517  AST 12* 10*  ALT 7* 9*  ALKPHOS 113 107  BILITOT 0.6 0.6  PROT 5.0* 5.2*  ALBUMIN 2.0* 2.0*   No results for input(s): LIPASE, AMYLASE in the last 168 hours.  Recent Labs Lab 07/10/17 1041 07/11/17 1215  AMMONIA 43* 78*   Coagulation Profile:  Recent Labs Lab 07/10/17 1214  INR 1.45   Cardiac Enzymes: No results for  input(s): CKTOTAL, CKMB, CKMBINDEX, TROPONINI in the last 168 hours. BNP (last 3 results) No results for input(s): PROBNP in the last 8760 hours. HbA1C: No results for input(s): HGBA1C in the last 72 hours. CBG:  Recent Labs Lab 07/07/17 2010  GLUCAP 116*   Lipid Profile: No results for input(s): CHOL, HDL, LDLCALC, TRIG, CHOLHDL, LDLDIRECT in the last 72 hours. Thyroid Function Tests: No results for input(s): TSH, T4TOTAL, FREET4, T3FREE, THYROIDAB in the last 72 hours. Anemia Panel: No results for input(s): VITAMINB12, FOLATE, FERRITIN, TIBC, IRON, RETICCTPCT in the last 72 hours. Urine analysis:    Component Value Date/Time   COLORURINE AMBER (A) 03/12/2017 1238   APPEARANCEUR CLOUDY (A) 03/12/2017 1238   LABSPEC 1.018 03/12/2017 1238   PHURINE 5.0 03/12/2017 1238   GLUCOSEU NEGATIVE 03/12/2017 1238   HGBUR MODERATE (A) 03/12/2017 1238   BILIRUBINUR NEGATIVE 03/12/2017 1238   KETONESUR NEGATIVE 03/12/2017 1238   PROTEINUR 100 (A) 03/12/2017 1238   UROBILINOGEN 4.0 (H) 06/02/2012 1200   NITRITE NEGATIVE 03/12/2017  1238   LEUKOCYTESUR LARGE (A) 03/12/2017 1238     Damarko Stitely M.D. [facility] 07/13/2017, 12:15 PM  Pager: [phone] Between 7am to 7pm - call Pager - [phone]  After 7pm go to [email or web address] - password [email or web address]  Call night coverage person covering after 7pm

## 2017-07-13 NOTE — Progress Notes (Signed)
Pt refuse CPAP for the night. Pt states that he just wants to rest with the oxygen. Pt SATs are stable at this time. Pt is alert but somewhat confused at times. No distress noted at this time.

## 2017-07-13 NOTE — Progress Notes (Signed)
CSW consulted as patient is from [facility]. Patient's family is in disagreement about patient goals of care and palliative continues to follow. CSW will continue to follow and support with disposition planning.  Abigail ButtsSusan Mamadou Breon, LCSWA [phone]

## 2017-07-13 NOTE — Progress Notes (Addendum)
Daily Progress Note   Patient Name: Jesus [name]       Date: 07/13/2017 DOB: November 05, 1946  Age: 71 y.o. MRN#: [id number] Attending Physician: Cathren Harsh, MD Primary Care Physician: Gerre Pebbles, [name] Admit Date: 07/07/2017  Reason for Consultation/Follow-up: Establishing goals of care and Interfamily conflict  Subjective: Patient in bed. More confused today. Tells me he is going to the hospital and wants to beat his Mama there.  Spoke with Jesus [name] (HCPOA) via telephone. She continues to have goals of comfort for her Dad, however, patient's son remains conflicted. Jesus [name] did not inform her of conversation with me yesterday. Jesus [name] agrees to arrange follow-up family meeting.  Jesus [name] is concerned that patient's pain and discomfort is being undermanaged due to events on Friday when patient was hypercarbic and received Narcan. She would like further symptom management.  Review of Systems  Unable to perform ROS: Mental status change    Length of Stay: 4  Current Medications: Scheduled Meds:  . apixaban  5 mg Oral BID  . atorvastatin  80 mg Oral Daily  . carvedilol  12.5 mg Oral BID WC  . divalproex  250 mg Oral TID  . feeding supplement (PRO-STAT SUGAR FREE 64)  30 mL Oral Q6H  . loratadine  10 mg Oral Daily  . memantine  5 mg Oral BID  . multivitamin with minerals  1 tablet Oral Daily  . OLANZapine zydis  5 mg Oral QHS  . predniSONE  5 mg Oral Q breakfast  . thiamine  100 mg Oral Daily    Continuous Infusions: . sodium chloride 75 mL/hr at 07/13/17 0208  . vancomycin Stopped (07/12/17 1934)    PRN Meds: acetaminophen **OR** acetaminophen, diphenhydrAMINE, HYDROcodone-acetaminophen, LORazepam, morphine injection, ondansetron **OR** ondansetron (ZOFRAN) IV  Physical Exam    Musculoskeletal:  BLE contracted, wrapped in UNA boots, toes with crusting  Neurological:  Awake, not oriented to place or situation  Skin: Rash noted.  Diffuse bullous rash in various stages of healing over entire body  Psychiatric:  Appears depressed            Vital Signs: BP (!) 82/66 (BP Location: Right Wrist)   Pulse (!) 25   Temp 98.4 F (36.9 C) (Axillary)   Resp 19   Ht 5' 10.5" (1.791 m)   Wt 127.5  kg (281 lb)   SpO2 96%   BMI 39.75 kg/m  SpO2: SpO2: 96 % O2 Device: O2 Device: Not Delivered O2 Flow Rate:    Intake/output summary:  Intake/Output Summary (Last 24 hours) at 07/13/17 1320 Last data filed at 07/13/17 2956  Gross per 24 hour  Intake           2272.5 ml  Output             1650 ml  Net            622.5 ml   LBM: Last BM Date: 07/13/17 Baseline Weight: Weight: 118.6 kg (261 lb 7.5 oz) Most recent weight: Weight: 127.5 kg (281 lb)       Palliative Assessment/Data: PPS: 30%     Patient Active Problem List   Diagnosis Date Noted  . Hepatic cirrhosis (HCC)   . Advance care planning   . Staphylococcal sepsis (HCC) 07/09/2017  . Bullous pemphigoid   . MRSA bacteremia   . Normochromic normocytic anemia 07/07/2017  . Anemia 07/07/2017  . PNA (pneumonia) 03/23/2017  . Hypoxemia   . OSA on CPAP   . Acute respiratory failure with hypoxia and hypercapnia (HCC) 02/28/2017  . Acute pulmonary edema (HCC) 02/28/2017  . Sepsis secondary to UTI (HCC) 02/28/2017  . Goals of care, counseling/discussion   . Palliative care encounter   . Acute encephalopathy 02/24/2017  . Palliative care by specialist   . Decubitus ulcer of sacral region, unstageable (HCC) 12/30/2016  . Altered mental status 12/30/2016  . Hyponatremia 07/15/2015  . Hypokalemia   . Chronic diastolic CHF (congestive heart failure) (HCC) 04/02/2015  . Pulmonary vascular congestion   . Peripheral edema   . 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 Renal artery  stenosis (HCC) 02/17/2014  . Peripheral vascular disease (HCC) 04/15/2013  . Morbid obesity (HCC) 06/11/2012  . Essential hypertension 05/18/2012  . Atrial fibrillation (HCC) 04/27/2012    Palliative Care Assessment & Plan   Patient Profile: 71 y.o.malewith past medical history of dementia, OSA, HTN, CHF, A. Fib, hepatic cirrhosis, chronic anemia, non healing decubitis ulcers requiring surgical debridement, chronic venous insufficieny, chronic edema, lesions in liver by CT (thought to be cyst vs pseudocyst)admitted on 7/31/2018with low hemoglobin and multiple blistering wounds over his body. Workup reveals blood cultures positive for MRSA. Patient has history of multiple admissions (3 in the last six months- last in March for AMS- hypoxia and hypercarbia, CHF exacerbation, sepsis)and poor functional status and medical noncompliance. Palliative medicine consulted for GOC.   Assessment/Recommendations/Plan   Family remains in conflict regarding patient's GOC  Plan for in person meeting with all family members present hopefully tomorrow- Jesus [name] in process of arranging  Symptom management: Complicated due to patient's OSA and propensity for hypercarbic incidents when he receives sedating medications  D/C hydrocodone  Start scheduled acetaminophen 650mg  q8hr po  Increase morphine 1mg  IV to 2hr prn- this dose relieves patient's pain, but isn't lasting long enough. May consider transition to long lasting po morphine tomorrow  Utilize benadryl po and topical for itching- discussed with RN and plan is to avoid administering sedating medications close together. Per Gwen administration of .5mg  lorazepam po and 1mg  IV morphine has seemed to provide patient relief.   Will restart Neurontin at 100mg  BID po for lower extremity pain- I do not expect this to affect his respiratory status    Goals of Care and Additional Recommendations:  Limitations on Scope of Treatment: Full Scope  Treatment  Code Status:  DNR  Prognosis:   Unable to determine  Discharge Planning:  To Be Determined  Care plan was discussed with Jesus [name] (pt HCPOA)  Thank you for allowing the Palliative Medicine Team to assist in the care of this patient.   Total time: 35 minutes  Greater than 50%  of this time was spent counseling and coordinating care related to the above assessment and plan.  Jesus BobKasie Kelissa [name], AGNP-C Palliative Medicine   Please contact Palliative Medicine Team phone at [phone] for questions and concerns.

## 2017-07-13 NOTE — Progress Notes (Signed)
Pharmacy Antibiotic Note  Jesus [name] is a 71 y.o. male admitted on 07/07/2017 and found to have MRSA bacteremia.  Patient has had slightly elevated SCr throughout this admission, his baseline from a few months ago looks like 0.7-1. Due to bump in SCr and necessity of extended length of therapy, vancomycin was changed to daptomycin. Pt is obese, per package insert, daptomycin should be dosed off of total body weight regardless of BMI.  Noted that patient is also on atorvastatin 80 mg. Major drug-drug interaction with atorvastatin and daptomycin, would consider holding atorvastatin until pt completes course of Dapto.   OUTPATIENT  PARENTERAL ANTIBIOTIC THERAPY (OPAT)  Indication: Bacteremia  Regimen: Daptomycin 1 g IV q24h End date: August 19, 2017  IV antibiotic discharge orders are pended. To discharging provider:  please sign these orders via discharge navigator,  Select New Orders & click on the button choice - Manage This Unsigned Work.    Plan: -Daptomycin 1000 mg IV q24h -Monitor renal fx, CPK q week -Continue abx through 08/19/17 per ID recommendation -OPAT entered -Consider holding Lipitor until patient completes Daptomycin   Height: 5' 10.5" (179.1 cm) Weight: 281 lb (127.5 kg) IBW/kg (Calculated) : 74.15  Temp (24hrs), Avg:98.3 F (36.8 C), Min:97.9 F (36.6 C), Max:98.7 F (37.1 C)   Recent Labs Lab 07/07/17 1955 07/08/17 0413 07/09/17 0758 07/10/17 0517 07/11/17 1516 07/12/17 0311  WBC 6.6 6.7 8.4 8.7 9.6  --   CREATININE 1.41* 1.40* 1.21 1.32* 1.36* 1.44*      Allergies  Allergen Reactions  . Amlodipine Besylate Swelling and Other (See Comments)    "started off low and ended up severe; if, in fact, that is what's causing the swelling" (06/03/12)  . Levaquin [Levofloxacin In D5w] Rash    Antimicrobials this admission:  Vanc 8/1 >> 8/5 Daptomycin 8/6 >>  Dose adjustments this admission:  N/A  Microbiology results:  7/31 BCx: 1/2  MRSA 8/2 BCx: ngtd 8/1 MRSA PCR: pos     Jesus [name], PharmD, BCPS Clinical Pharmacist 07/13/2017 5:16 PM

## 2017-07-13 NOTE — Progress Notes (Signed)
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 The pt was placed on Bipap.   -Patient's pCO2 continues to decrease and is now 60.6 -If aggressive treatment is desired, continue Vancomycin for 6 weeks.   -Patient's renal function continues to worsen with cr=1.44 today 8/6 vs 1.36 on 8/5  -can consider placing a picc line for 6-week abx course  -OPAT orders already made as of 07/10/17  -pharmacy assistance with dosing and monitoring trough to 15-20 is appreciated    Bullous Pemphigoid Consider consult to dermatology  Goals of Care Patient's family continues to remain undecided about goals of care. Patient's daughter would like comfort care, but son would like aggressive measures.    Marland Kitchen apixaban  5 mg Oral BID  . atorvastatin  80 mg Oral Daily  . carvedilol  12.5 mg Oral BID WC  . divalproex  250 mg Oral TID  . feeding supplement (PRO-STAT SUGAR FREE 64)  30 mL Oral Q6H  . loratadine  10 mg Oral Daily  . memantine  5 mg Oral BID  . multivitamin with minerals  1 tablet Oral Daily  . OLANZapine zydis  5 mg Oral QHS  . predniSONE  5 mg Oral Q breakfast  . thiamine  100 mg Oral Daily    SUBJECTIVE: Jesus [name] was doing well this morning. He was less agitated, denied any sob or chest pain.   Review of Systems: ROS none except above  Allergies  Allergen Reactions  . Amlodipine Besylate Swelling and Other (See Comments)    "started off low and ended up severe; if, in fact, that is what's causing the swelling" (06/03/12)  . Levaquin [Levofloxacin In D5w] Rash    OBJECTIVE: Vitals:   07/13/17 0003 07/13/17 0433 07/13/17 0434 07/13/17 0644  BP: (!) 108/94 (!) 91/51  (!)  113/55  Pulse: 67   82  Resp: 16   20  Temp: 98.7 F (37.1 C) 98.6 F (37 C)  98.2 F (36.8 C)  TempSrc: Axillary Oral  Oral  SpO2: 100%   100%  Weight:   278 lb (126.1 kg) 281 lb (127.5 kg)  Height:       Body mass index is 39.75 kg/m.  Physical Exam  Constitutional: He is well-developed, well-nourished, and in no distress. No distress.  HENT:  Head: Normocephalic and atraumatic.  Cardiovascular: Normal rate, regular rhythm and normal heart sounds.   No murmur heard. Pulmonary/Chest: Effort normal and breath sounds normal. No respiratory distress. He has no wheezes. He exhibits tenderness.  Abdominal: Soft. Bowel sounds are normal.  Skin: Bruising, ecchymosis, petechiae and rash noted. There is erythema.  Dried blood present over bilateral arms and upper trunk     Lab Results Lab Results  Component Value Date   WBC 9.6 07/11/2017   HGB 8.4 (L) 07/11/2017   HCT 27.5 (L) 07/11/2017   MCV 95.8 07/11/2017   PLT 237 07/11/2017    Lab Results  Component Value Date   CREATININE 1.44 (H) 07/12/2017   BUN 32 (H) 07/12/2017   NA 141 07/12/2017   K 4.5 07/12/2017  CL 103 07/12/2017   CO2 31 07/12/2017    Lab Results  Component Value Date   ALT 9 (L) 07/10/2017   AST 10 (L) 07/10/2017   ALKPHOS 107 07/10/2017   BILITOT 0.6 07/10/2017     Microbiology: Recent Results (from the past 240 hour(s))  Blood culture (routine x 2)     Status: Abnormal   Collection Time: 07/07/17  7:55 PM  Result Value Ref Range Status   Specimen Description BLOOD RIGHT HAND  Final   Special Requests   Final    BOTTLES DRAWN AEROBIC AND ANAEROBIC Blood Culture adequate volume   Culture  Setup Time   Final    GRAM POSITIVE COCCI IN CLUSTERS AEROBIC BOTTLE ONLY CRITICAL RESULT CALLED TO, READ BACK BY AND VERIFIED WITH: Artelia Laroche Pharm.D. 14:55 07/08/17 ([email or web address])    Culture METHICILLIN RESISTANT STAPHYLOCOCCUS AUREUS (A)  Final   Report Status 07/10/2017 FINAL  Final   Organism ID, Bacteria  METHICILLIN RESISTANT STAPHYLOCOCCUS AUREUS  Final      Susceptibility   Methicillin resistant staphylococcus aureus - MIC*    CIPROFLOXACIN >=8 RESISTANT Resistant     ERYTHROMYCIN >=8 RESISTANT Resistant     GENTAMICIN <=0.5 SENSITIVE Sensitive     OXACILLIN >=4 RESISTANT Resistant     TETRACYCLINE <=1 SENSITIVE Sensitive     VANCOMYCIN <=0.5 SENSITIVE Sensitive     TRIMETH/SULFA >=320 RESISTANT Resistant     CLINDAMYCIN <=0.25 SENSITIVE Sensitive     RIFAMPIN <=0.5 SENSITIVE Sensitive     Inducible Clindamycin NEGATIVE Sensitive     * METHICILLIN RESISTANT STAPHYLOCOCCUS AUREUS  Blood Culture ID Panel (Reflexed)     Status: Abnormal   Collection Time: 07/07/17  7:55 PM  Result Value Ref Range Status   Enterococcus species NOT DETECTED NOT DETECTED Final   Listeria monocytogenes NOT DETECTED NOT DETECTED Final   Staphylococcus species DETECTED (A) NOT DETECTED Final    Comment: CRITICAL RESULT CALLED TO, READ BACK BY AND VERIFIED WITH: MMayford Knife Pharm.D. 14:55 07/08/17 ([email or web address])    Staphylococcus aureus DETECTED (A) NOT DETECTED Final    Comment: Methicillin (oxacillin)-resistant Staphylococcus aureus (MRSA). MRSA is predictably resistant to beta-lactam antibiotics (except ceftaroline). Preferred therapy is vancomycin unless clinically contraindicated. Patient requires contact precautions if  hospitalized. CRITICAL RESULT CALLED TO, READ BACK BY AND VERIFIED WITH: M. Mayford Knife Pharm.D. 14:55 07/08/17 ([email or web address])    Methicillin resistance DETECTED (A) NOT DETECTED Final    Comment: CRITICAL RESULT CALLED TO, READ BACK BY AND VERIFIED WITH: M. Mayford Knife Pharm.D. 14:55 07/08/17 ([email or web address])    Streptococcus species NOT DETECTED NOT DETECTED Final   Streptococcus agalactiae NOT DETECTED NOT DETECTED Final   Streptococcus pneumoniae NOT DETECTED NOT DETECTED Final   Streptococcus pyogenes NOT DETECTED NOT DETECTED Final   Acinetobacter baumannii NOT DETECTED NOT DETECTED Final   Enterobacteriaceae  species NOT DETECTED NOT DETECTED Final   Enterobacter cloacae complex NOT DETECTED NOT DETECTED Final   Escherichia coli NOT DETECTED NOT DETECTED Final   Klebsiella oxytoca NOT DETECTED NOT DETECTED Final   Klebsiella pneumoniae NOT DETECTED NOT DETECTED Final   Proteus species NOT DETECTED NOT DETECTED Final   Serratia marcescens NOT DETECTED NOT DETECTED Final   Haemophilus influenzae NOT DETECTED NOT DETECTED Final   Neisseria meningitidis NOT DETECTED NOT DETECTED Final   Pseudomonas aeruginosa NOT DETECTED NOT DETECTED Final   Candida albicans NOT DETECTED NOT DETECTED Final   Candida glabrata NOT DETECTED NOT DETECTED Final   Candida krusei NOT DETECTED NOT DETECTED Final  Candida parapsilosis NOT DETECTED NOT DETECTED Final   Candida tropicalis NOT DETECTED NOT DETECTED Final  Blood culture (routine x 2)     Status: None   Collection Time: 07/07/17  8:42 PM  Result Value Ref Range Status   Specimen Description BLOOD RIGHT ANTECUBITAL  Final   Special Requests   Final    BOTTLES DRAWN AEROBIC AND ANAEROBIC Blood Culture adequate volume   Culture NO GROWTH 5 DAYS  Final   Report Status 07/12/2017 FINAL  Final  MRSA PCR Screening     Status: Abnormal   Collection Time: 07/08/17 12:45 AM  Result Value Ref Range Status   MRSA by PCR POSITIVE (A) NEGATIVE Final    Comment:        The GeneXpert MRSA Assay (FDA approved for NASAL specimens only), is one component of a comprehensive MRSA colonization surveillance program. It is not intended to diagnose MRSA infection nor to guide or monitor treatment for MRSA infections. RESULT CALLED TO, READ BACK BY AND VERIFIED WITH: Barbaraann Cao CASTRO RN [phone] 07/08/17 A BROWNING   Culture, blood (Routine X 2) w Reflex to ID Panel     Status: None (Preliminary result)   Collection Time: 07/09/17  8:23 AM  Result Value Ref Range Status   Specimen Description BLOOD RIGHT HAND  Final   Special Requests   Final    BOTTLES DRAWN AEROBIC ONLY Blood  Culture results may not be optimal due to an inadequate volume of blood received in culture bottles   Culture NO GROWTH 3 DAYS  Final   Report Status PENDING  Incomplete  Culture, blood (Routine X 2) w Reflex to ID Panel     Status: None (Preliminary result)   Collection Time: 07/09/17  8:28 AM  Result Value Ref Range Status   Specimen Description BLOOD RIGHT HAND  Final   Special Requests   Final    BOTTLES DRAWN AEROBIC ONLY Blood Culture results may not be optimal due to an inadequate volume of blood received in culture bottles   Culture NO GROWTH 3 DAYS  Final   Report Status PENDING  Incomplete    Jesus CourierVahini Dailey Alberson, MD [facility] [phone] pager   [phone] cell 07/13/2017, 8:25 AM

## 2017-07-14 DIAGNOSIS — K7460 Unspecified cirrhosis of liver: Secondary | ICD-10-CM

## 2017-07-14 DIAGNOSIS — G4733 Obstructive sleep apnea (adult) (pediatric): Secondary | ICD-10-CM

## 2017-07-14 DIAGNOSIS — Z515 Encounter for palliative care: Secondary | ICD-10-CM

## 2017-07-14 DIAGNOSIS — Z9989 Dependence on other enabling machines and devices: Secondary | ICD-10-CM

## 2017-07-14 LAB — CULTURE, BLOOD (ROUTINE X 2)
CULTURE: NO GROWTH
Culture: NO GROWTH

## 2017-07-14 MED ORDER — SODIUM CHLORIDE 0.9% FLUSH: 3.0000 mL | Freq: Two times a day (BID) | INTRAVENOUS | Status: DC

## 2017-07-14 MED ORDER — SODIUM CHLORIDE 0.9% FLUSH: 3.0000 mL | INTRAVENOUS | Status: DC | PRN

## 2017-07-14 MED ORDER — SODIUM CHLORIDE 0.9 % IV SOLN: 250.0000 mL | INTRAVENOUS | Status: DC | PRN

## 2017-07-14 NOTE — Progress Notes (Signed)
Patient refused CPAP tonight. Patient is confused and is yelling and screaming. Patient in no distress at this time.

## 2017-07-14 NOTE — Consult Note (Signed)
   [facility] CM Inpatient Consult   07/14/2017  [place] DragonDouglas M Marti [birth year] [id number]  Spoke with inpatient social worker, Darl PikesSusan, this morning regarding [facility]'s status with [facility].  Patient was evaluated and was a [facility] [address] [facility]. Chart review of her notes that the patient is a [facility] will not outreach for community care management services.  Will sign off.  For questions, please contact:  Charlesetta ShanksVictoria Zoee Heeney, RN BSN CCM [facility]  [phone] business mobile phone Toll free office [phone]

## 2017-07-14 NOTE — Progress Notes (Addendum)
[facility]                                                                              Patient Demographics  Jesus [name], is a 71 y.o. male, DOB - [birth year], ZOX:[id number]  Admit date - 07/07/2017   Admitting Physician Eduard Clos, MD  Outpatient Primary MD for the patient is Gerre Pebbles, [name]  Outpatient specialists:   LOS - 5  days   Medical records reviewed and are as summarized below:    Chief Complaint  Patient presents with  . Abnormal Lab  . Altered Mental Status  . Rash       Brief summary   71 year old with history of diastolic heart failure, atrial fibrillation, anemia, liver services, brought to the emergency department with complaints of low hemoglobin. Admitted for MRSA bacteremia and anemia. Infectious disease consulted and appreciated. Palliative care also following.   Assessment & Plan    MRSA bacteremia, in the setting of bullous pemphigoid - ID following, patient will need 6 weeks of IV vancomycin if the patient is to be treated aggressively and will need PICC line - 2-D echo EF 55-60% - Currently on IV vancomycin - Palliative medicine following closely, family conflict with 3 daughters requesting transition to hospice and comfort measures however patient's son wants to continue with full aggressive care.  - Will await for further decisions from family and then decide management regarding disposition, Palliative goals of care meeting planned again today  Anemia of chronic illness - FOBT negative, patient has received 3 units packed RBCs for hemoglobin of 6.7 - Currently H&H stable   Blistering skin rash consistent with possible bullous pemphigoid - Continue wound care, continue prednisone  Acute metabolic encephalopathy in the setting of dementia - On 8/4, patient was lethargic, somnolent, ABG showed pH of 7.3/PCO2 64, no significant response with narcan and his pain medications had been increased a day before.  -  His pain medications were decreased. Patient was transferred to stepdown unit and was placed on BiPAP.  - Currently alert and awake, intermittently confused. He has not needed BiPAP in over 24 hours.   Sacral decub ulcers, stage IV Cont wound care  Chronic atrial fibrillation - Heart rate controlled, continue eliquis  Acute kidney injury - baseline Cr 0.9-1, upto 1.4 - on gentle hydration, awaiting goals of care  Essential hypertension - Continue Coreg  Code Status: DNR DVT Prophylaxis:  eliquis  Family Communication:   Disposition Plan: Awaiting palliative goals of care regarding disposition  Time Spent in minutes  15 minutes  Procedures:  2d ECHO Impressions:  - Technically difficult study with poor acoustic windows. The   patient was in atrial fibrillation. Normal LV size with moderate   LV hypertrophy. EF 55-60%. Mildly dilated RV with normal systolic   function. Biatrial enlargement. Dilated IVC suggestive of   elevated RV filling pressure.  Consultants:   Palliative care Infectious disease  Antimicrobials:      Medications  Scheduled Meds: . acetaminophen  650 mg Oral Q8H  . apixaban  5 mg Oral BID  . carvedilol  12.5 mg Oral BID WC  .  divalproex  250 mg Oral TID  . feeding supplement (PRO-STAT SUGAR FREE 64)  30 mL Oral Q6H  . gabapentin  100 mg Oral BID  . loratadine  10 mg Oral Daily  . memantine  5 mg Oral BID  . multivitamin with minerals  1 tablet Oral Daily  . OLANZapine zydis  5 mg Oral QHS  . predniSONE  5 mg Oral Q breakfast  . thiamine  100 mg Oral Daily   Continuous Infusions: . sodium chloride 75 mL/hr at 07/13/17 0208  . DAPTOmycin (CUBICIN)  IV Stopped (07/13/17 1923)   PRN Meds:.acetaminophen **OR** acetaminophen, diphenhydrAMINE, diphenhydrAMINE-zinc acetate, LORazepam, morphine injection, ondansetron **OR** ondansetron (ZOFRAN) IV   Antibiotics   Anti-infectives    Start     Dose/Rate Route Frequency Ordered Stop   07/13/17  1800  DAPTOmycin (CUBICIN) 1,000 mg in sodium chloride 0.9 % IVPB     1,000 mg 240 mL/hr over 30 Minutes Intravenous Every 24 hours 07/13/17 1728     07/09/17 1800  vancomycin (VANCOCIN) 1,250 mg in sodium chloride 0.9 % 250 mL IVPB  Status:  Discontinued     1,250 mg 166.7 mL/hr over 90 Minutes Intravenous Every 24 hours 07/08/17 1606 07/13/17 1713   07/08/17 1700  vancomycin (VANCOCIN) 2,000 mg in sodium chloride 0.9 % 500 mL IVPB     2,000 mg 250 mL/hr over 120 Minutes Intravenous  Once 07/08/17 1606 07/08/17 2249        Subjective:   Jesus [name] was seen and examined today. Alert and awake but still confused. Oriented to himself and place. Did not need BiPAP overnight. No fevers or chills. Denies any pain currently. O2 sats 98%. No significant shortness of breath.   Objective:   Vitals:   07/14/17 0616 07/14/17 0848 07/14/17 1052 07/14/17 1210  BP: 116/83 (!) 164/66 (!) 146/113 (!) 144/81  Pulse: 97 72 75 64  Resp: (!) 22   (!) 22  Temp: 98.7 F (37.1 C) 98.3 F (36.8 C)  97.9 F (36.6 C)  TempSrc: Oral Oral  Axillary  SpO2: 99% 98%    Weight: 124.3 kg (274 lb)     Height:        Intake/Output Summary (Last 24 hours) at 07/14/17 1358 Last data filed at 07/14/17 0800  Gross per 24 hour  Intake              120 ml  Output             1700 ml  Net            -1580 ml     Wt Readings from Last 3 Encounters:  07/14/17 124.3 kg (274 lb)  04/17/17 117.2 kg (258 lb 6.4 oz)  04/14/17 111.7 kg (246 lb 3.2 oz)     Exam   General: Alert and oriented x2, confused  Eyes:   HEENT:    Cardiovascular: S1 S2 auscultated, no rubs, murmurs or gallops. Regular rate and rhythm.   Respiratory: Clear to auscultation bilaterally, no wheezing, rales or rhonchi  Gastrointestinal: Morbidly obese, Soft, nontender, nondistended, + bowel sounds  Ext:  1+ pedal edema bilaterally  Neuro:   Musculoskeletal: No digital cyanosis, clubbing  Skin: Diffuse blistering rash  all over the body in different stages of healing  Psych: confused   Data Reviewed:  I have personally reviewed following labs and imaging studies  Micro Results Recent Results (from the past 240 hour(s))  Blood culture (routine x 2)  Status: Abnormal   Collection Time: 07/07/17  7:55 PM  Result Value Ref Range Status   Specimen Description BLOOD RIGHT HAND  Final   Special Requests   Final    BOTTLES DRAWN AEROBIC AND ANAEROBIC Blood Culture adequate volume   Culture  Setup Time   Final    GRAM POSITIVE COCCI IN CLUSTERS AEROBIC BOTTLE ONLY CRITICAL RESULT CALLED TO, READ BACK BY AND VERIFIED WITH: Artelia Laroche Pharm.D. 14:55 07/08/17 ([email or web address])    Culture METHICILLIN RESISTANT STAPHYLOCOCCUS AUREUS (A)  Final   Report Status 07/10/2017 FINAL  Final   Organism ID, Bacteria METHICILLIN RESISTANT STAPHYLOCOCCUS AUREUS  Final      Susceptibility   Methicillin resistant staphylococcus aureus - MIC*    CIPROFLOXACIN >=8 RESISTANT Resistant     ERYTHROMYCIN >=8 RESISTANT Resistant     GENTAMICIN <=0.5 SENSITIVE Sensitive     OXACILLIN >=4 RESISTANT Resistant     TETRACYCLINE <=1 SENSITIVE Sensitive     VANCOMYCIN <=0.5 SENSITIVE Sensitive     TRIMETH/SULFA >=320 RESISTANT Resistant     CLINDAMYCIN <=0.25 SENSITIVE Sensitive     RIFAMPIN <=0.5 SENSITIVE Sensitive     Inducible Clindamycin NEGATIVE Sensitive     * METHICILLIN RESISTANT STAPHYLOCOCCUS AUREUS  Blood Culture ID Panel (Reflexed)     Status: Abnormal   Collection Time: 07/07/17  7:55 PM  Result Value Ref Range Status   Enterococcus species NOT DETECTED NOT DETECTED Final   Listeria monocytogenes NOT DETECTED NOT DETECTED Final   Staphylococcus species DETECTED (A) NOT DETECTED Final    Comment: CRITICAL RESULT CALLED TO, READ BACK BY AND VERIFIED WITH: MMayford Knife Pharm.D. 14:55 07/08/17 ([email or web address])    Staphylococcus aureus DETECTED (A) NOT DETECTED Final    Comment: Methicillin (oxacillin)-resistant Staphylococcus aureus  (MRSA). MRSA is predictably resistant to beta-lactam antibiotics (except ceftaroline). Preferred therapy is vancomycin unless clinically contraindicated. Patient requires contact precautions if  hospitalized. CRITICAL RESULT CALLED TO, READ BACK BY AND VERIFIED WITH: M. Mayford Knife Pharm.D. 14:55 07/08/17 ([email or web address])    Methicillin resistance DETECTED (A) NOT DETECTED Final    Comment: CRITICAL RESULT CALLED TO, READ BACK BY AND VERIFIED WITH: M. Mayford Knife Pharm.D. 14:55 07/08/17 (wilsonm)    Streptococcus species NOT DETECTED NOT DETECTED Final   Streptococcus agalactiae NOT DETECTED NOT DETECTED Final   Streptococcus pneumoniae NOT DETECTED NOT DETECTED Final   Streptococcus pyogenes NOT DETECTED NOT DETECTED Final   Acinetobacter baumannii NOT DETECTED NOT DETECTED Final   Enterobacteriaceae species NOT DETECTED NOT DETECTED Final   Enterobacter cloacae complex NOT DETECTED NOT DETECTED Final   Escherichia coli NOT DETECTED NOT DETECTED Final   Klebsiella oxytoca NOT DETECTED NOT DETECTED Final   Klebsiella pneumoniae NOT DETECTED NOT DETECTED Final   Proteus species NOT DETECTED NOT DETECTED Final   Serratia marcescens NOT DETECTED NOT DETECTED Final   Haemophilus influenzae NOT DETECTED NOT DETECTED Final   Neisseria meningitidis NOT DETECTED NOT DETECTED Final   Pseudomonas aeruginosa NOT DETECTED NOT DETECTED Final   Candida albicans NOT DETECTED NOT DETECTED Final   Candida glabrata NOT DETECTED NOT DETECTED Final   Candida krusei NOT DETECTED NOT DETECTED Final   Candida parapsilosis NOT DETECTED NOT DETECTED Final   Candida tropicalis NOT DETECTED NOT DETECTED Final  Blood culture (routine x 2)     Status: None   Collection Time: 07/07/17  8:42 PM  Result Value Ref Range Status   Specimen Description BLOOD RIGHT ANTECUBITAL  Final   Special Requests   Final  BOTTLES DRAWN AEROBIC AND ANAEROBIC Blood Culture adequate volume   Culture NO GROWTH 5 DAYS  Final   Report Status 07/12/2017  FINAL  Final  MRSA PCR Screening     Status: Abnormal   Collection Time: 07/08/17 12:45 AM  Result Value Ref Range Status   MRSA by PCR POSITIVE (A) NEGATIVE Final    Comment:        The GeneXpert MRSA Assay (FDA approved for NASAL specimens only), is one component of a comprehensive MRSA colonization surveillance program. It is not intended to diagnose MRSA infection nor to guide or monitor treatment for MRSA infections. RESULT CALLED TO, READ BACK BY AND VERIFIED WITH: Barbaraann Cao RN [phone] 07/08/17 A BROWNING   Culture, blood (Routine X 2) w Reflex to ID Panel     Status: None (Preliminary result)   Collection Time: 07/09/17  8:23 AM  Result Value Ref Range Status   Specimen Description BLOOD RIGHT HAND  Final   Special Requests   Final    BOTTLES DRAWN AEROBIC ONLY Blood Culture results may not be optimal due to an inadequate volume of blood received in culture bottles   Culture NO GROWTH 4 DAYS  Final   Report Status PENDING  Incomplete  Culture, blood (Routine X 2) w Reflex to ID Panel     Status: None (Preliminary result)   Collection Time: 07/09/17  8:28 AM  Result Value Ref Range Status   Specimen Description BLOOD RIGHT HAND  Final   Special Requests   Final    BOTTLES DRAWN AEROBIC ONLY Blood Culture results may not be optimal due to an inadequate volume of blood received in culture bottles   Culture NO GROWTH 4 DAYS  Final   Report Status PENDING  Incomplete    Radiology Reports No results found.  Lab Data:  CBC:  Recent Labs Lab 07/07/17 1955 07/08/17 0413 07/09/17 0758 07/10/17 0517 07/11/17 1516  WBC 6.6 6.7 8.4 8.7 9.6  HGB 6.7* 7.4* 8.5* 8.5* 8.4*  HCT 21.8* 23.5* 26.7* 27.2* 27.5*  MCV 94.0 92.9 93.0 93.2 95.8  PLT 269 254 223 234 237   Basic Metabolic Panel:  Recent Labs Lab 07/08/17 0413 07/09/17 0758 07/10/17 0517 07/11/17 1516 07/12/17 0311  NA 138 138 139 139 141  K 3.8 3.9 4.3 4.7 4.5  CL 97* 98* 101 101 103  CO2 31 30 30  32 31    GLUCOSE 86 116* 75 67 102*  BUN 33* 26* 26* 29* 32*  CREATININE 1.40* 1.21 1.32* 1.36* 1.44*  CALCIUM 8.6* 8.6* 8.8* 9.0 8.9   GFR: Estimated Creatinine Clearance: 62.7 mL/min (A) (by C-G formula based on SCr of 1.44 mg/dL (H)). Liver Function Tests:  Recent Labs Lab 07/07/17 1955 07/10/17 0517  AST 12* 10*  ALT 7* 9*  ALKPHOS 113 107  BILITOT 0.6 0.6  PROT 5.0* 5.2*  ALBUMIN 2.0* 2.0*   No results for input(s): LIPASE, AMYLASE in the last 168 hours.  Recent Labs Lab 07/10/17 1041 07/11/17 1215  AMMONIA 43* 78*   Coagulation Profile:  Recent Labs Lab 07/10/17 1214  INR 1.45   Cardiac Enzymes: No results for input(s): CKTOTAL, CKMB, CKMBINDEX, TROPONINI in the last 168 hours. BNP (last 3 results) No results for input(s): PROBNP in the last 8760 hours. HbA1C: No results for input(s): HGBA1C in the last 72 hours. CBG:  Recent Labs Lab 07/07/17 2010  GLUCAP 116*   Lipid Profile: No results for input(s): CHOL, HDL, LDLCALC, TRIG, CHOLHDL,  LDLDIRECT in the last 72 hours. Thyroid Function Tests: No results for input(s): TSH, T4TOTAL, FREET4, T3FREE, THYROIDAB in the last 72 hours. Anemia Panel: No results for input(s): VITAMINB12, FOLATE, FERRITIN, TIBC, IRON, RETICCTPCT in the last 72 hours. Urine analysis:    Component Value Date/Time   COLORURINE AMBER (A) 03/12/2017 1238   APPEARANCEUR CLOUDY (A) 03/12/2017 1238   LABSPEC 1.018 03/12/2017 1238   PHURINE 5.0 03/12/2017 1238   GLUCOSEU NEGATIVE 03/12/2017 1238   HGBUR MODERATE (A) 03/12/2017 1238   BILIRUBINUR NEGATIVE 03/12/2017 1238   KETONESUR NEGATIVE 03/12/2017 1238   PROTEINUR 100 (A) 03/12/2017 1238   UROBILINOGEN 4.0 (H) 06/02/2012 1200   NITRITE NEGATIVE 03/12/2017 1238   LEUKOCYTESUR LARGE (A) 03/12/2017 1238     Durand Wittmeyer M.D. [facility] 07/14/2017, 1:58 PM  Pager: [phone] Between 7am to 7pm - call Pager - [phone]  After 7pm go to [email or web address] - password  [email or web address]  Call night coverage person covering after 7pm

## 2017-07-14 NOTE — Plan of Care (Signed)
Problem: Skin Integrity: Goal: Risk for impaired skin integrity will decrease Outcome: Not Progressing Pt still at an increased risk for impaired skin integrity.

## 2017-07-14 NOTE — Progress Notes (Signed)
CSW met with palliative care NP, Kasie, and patient's two daughters, Jonette Eva and [place]. Family was initially in disagreement about goals of care. Family has now chosen to pursue comfort care measures for patient and have chosen [facility]. CSW made referral to hospice. CSW will continue to follow and support with discharge.  Estanislado Emms, [place]

## 2017-07-14 NOTE — Progress Notes (Signed)
Daily Progress Note   Patient Name: Jesus [name]       Date: 07/14/2017 DOB: 1946/02/09  Age: 71 y.o. MRN#: [id number] Attending Physician: Mendel Corning, MD Primary Care Physician: Adron Bene, [name] Admit Date: 07/07/2017  Reason for Consultation/Follow-up: Establishing goals of care, Interfamily conflict, Inpatient hospice referral, Terminal Care and Withdrawal of life-sustaining treatment  Subjective: Patient in bed. Increased confusion today. Mumbling, coherent only intermittently. Unable to follow commands. Not swallowing po meds.   Met with Jesus [name] and Jesus [name] (pt HCPOA) along with Jesus Schwartz, LCSW. Discussed patient status does not appear to be improving. Today confusion is worse. We discussed limits in medical care and again reviewed options for continued aggressive medical care vs options for comfort care.  Final GOC decision made not to place PICC. Decision to proceed with full comfort measures. D/C antibiotics, do not use Bipap. Give medications and interventions only for comfort. Pursue residential Hospice placement.   Discussed with Jesus [name] and Jesus [name] that Bipap will not be used at [facility] and they verbalized understanding. They also requested Bipap not be used in hospital as well. They wish for their Dad to be comfortable, that is there only GOC at this point in time.   Review of Systems  Unable to perform ROS: Mental status change    Length of Stay: 5  Current Medications: Scheduled Meds:  . acetaminophen  650 mg Oral Q8H  . divalproex  250 mg Oral TID  . gabapentin  100 mg Oral BID  . predniSONE  5 mg Oral Q breakfast  . sodium chloride flush  3 mL Intravenous Q12H    Continuous Infusions: . sodium chloride      PRN Meds: sodium chloride, acetaminophen **OR**  acetaminophen, diphenhydrAMINE, diphenhydrAMINE-zinc acetate, LORazepam, morphine injection, ondansetron **OR** ondansetron (ZOFRAN) IV, sodium chloride flush  Physical Exam  Cardiovascular: Normal rate.   Pulmonary/Chest: He has wheezes.  Abdominal: He exhibits distension.  Musculoskeletal:  Lower ble wrapped in unna boots, does not move  BLE  Neurological:  UTA orientation, not following commands, mumbling incoherently  Skin:  Diffuse bullous rash in various stages of healing over entire body            Vital Signs: BP (!) 144/81 (BP Location: Left Arm)   Pulse 64   Temp 97.9 F (36.6 C) (Axillary)  Resp (!) 22   Ht 5' 10.5" (1.791 m)   Wt 124.3 kg (274 lb)   SpO2 98%   BMI 38.76 kg/m  SpO2: SpO2: 98 % O2 Device: O2 Device: Not Delivered O2 Flow Rate:    Intake/output summary:  Intake/Output Summary (Last 24 hours) at 07/14/17 1557 Last data filed at 07/14/17 1500  Gross per 24 hour  Intake              320 ml  Output             1700 ml  Net            -1380 ml   LBM: Last BM Date: 07/13/17 Baseline Weight: Weight: 118.6 kg (261 lb 7.5 oz) Most recent weight: Weight: 124.3 kg (274 lb)       Palliative Assessment/Data: PPS: 20%      Patient Active Problem List   Diagnosis Date Noted  . Hepatic cirrhosis ([place])   . Advance care planning   . Staphylococcal sepsis ([place]) 07/09/2017  . Bullous pemphigoid   . MRSA bacteremia   . Normochromic normocytic anemia 07/07/2017  . Anemia 07/07/2017  . PNA (pneumonia) 03/23/2017  . Hypoxemia   . OSA on CPAP   . Acute respiratory failure with hypoxia and hypercapnia ([place]) 02/28/2017  . Acute pulmonary edema ([place]) 02/28/2017  . Sepsis secondary to UTI ([place]) 02/28/2017  . Goals of care, counseling/discussion   . Palliative care encounter   . Acute encephalopathy 02/24/2017  . Palliative care by specialist   . Decubitus ulcer of sacral region, unstageable ([place]) 12/30/2016  . Altered mental status 12/30/2016  .  Hyponatremia 07/15/2015  . Hypokalemia   . Chronic diastolic CHF (congestive heart failure) ([place]) 04/02/2015  . Pulmonary vascular congestion   . Peripheral edema   . 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 Renal artery stenosis ([place]) 02/17/2014  . Peripheral vascular disease ([place]) 04/15/2013  . Morbid obesity ([place]) 06/11/2012  . Essential hypertension 05/18/2012  . Atrial fibrillation ([place]) 04/27/2012    Palliative Care Assessment & Plan   Patient Profile: 71 y.o.malewith past medical history of dementia, OSA, HTN, CHF, A. Fib, hepatic cirrhosis, chronic anemia, non healing decubitis ulcers requiring surgical debridement, chronic venous insufficieny, chronic edema, lesions in liver by CT (thought to be cyst vs pseudocyst)admitted on 7/31/2018with low hemoglobin and multiple blistering wounds over his body. Workup reveals blood cultures positive for MRSA. Patient has history of multiple admissions (3 in the last six months- last in March for AMS- hypoxia and hypercarbia, CHF exacerbation, sepsis)and poor functional status and medical noncompliance. Palliative medicine consulted for [place].   Assessment/Recommendations/Plan   DNR/DNI, comfort measures, no Bipap  Referral for residential Hospice placement  Continue current symptom management regimen as this has appeared to have kept patient calm and comfortable today  Goals of Care and Additional Recommendations:  Limitations on Scope of Treatment: Full Comfort Care  Code Status:  DNR  Prognosis:   < 2 weeks due MRSA bacteremia, encephalopathy in setting of elevated ammonia and hx of liver cirrhosis; unhealing sacral wound with depth to bone concerning for osteomyelitis, poor nutritional status (albumin 2.0), poor po intake, acute on chronic anemia, OSA with hypercarbia and refusal to wear CPAP- worsens when pain is controlled with needed pain medication, now plan for transition to full comfort measures.     Discharge Planning:  Hospice facility  Care plan was discussed with patient's children Jesus [name] (HCPOA) and Jesus [name], and Jesus Schwartz,  LCSW.  Thank you for allowing the Palliative Medicine Team to assist in the care of this patient.  Time in: 1415 Time out: 1600 Total Time: 105 minutes Prolonged services billed: yes Greater than 50%  of this time was spent counseling and coordinating care related to the above assessment and plan.  Mariana Kaufman, AGNP-C Palliative Medicine   Please contact Palliative Medicine Team phone at [phone] for questions and concerns.

## 2017-07-14 NOTE — Progress Notes (Signed)
No charge note:   Spoke with patient's daughter- she states she will be here sometime this afternoon if she can find someone to cover her store. Anticipate follow up GOC meeting then. She plans to call me when she arrives.   Ocie BobKasie Ketra [name], AGNP-C Palliative Medicine  Please call Palliative Medicine team phone with any questions [phone]. For individual providers please see AMION.

## 2017-07-14 NOTE — Clinical Social Work Note (Signed)
Clinical Social Work Assessment  Patient Details  Name: Jesus [name] MRN: [id number] Date of Birth: 1946-04-25  Date of referral:  07/14/17               Reason for consult:  Facility Placement                Permission sought to share information with:  Facility Medical sales representativeContact Representative, Family Supports Permission granted to share information::  No (patient disoriented)  [facility]::     [facility]  Agency::  [facility]  Relationship::  Daughter     Contact Information:  [phone]   Housing/Transportation Living arrangements for the past 2 months:  Skilled Nursing Facility Source of Information:  Facility Patient Interpreter Needed:  None Criminal Activity/Legal Involvement Pertinent to Current Situation/Hospitalization:  No - Comment as needed Significant Relationships:  Adult Children Lives with:  Facility Resident Do you feel safe going back to the place where you live?  Yes Need for family participation in patient care:  Yes (Comment)  Care giving concerns: [facility] Sister, Chales AbrahamsMary-Ann, at bedside deferred to patient's children for decision making. Family has had ongoing conversations with palliative care.   Social Worker assessment / plan: CSW spoke to [place], Lora HavensKasie, and planned another goals of care meeting with patient's family for this afternoon. CSW spoke to patient's daughter, Jennette KettleDeanna, via phone and Jennette KettleDeanna indicates all siblings are in agreement to go with comfort care measures for patient, as opposed to aggressive treatment. CSW left messages for patient's daughter [name], who is HCPOA, and patient's son, Sharia ReeveJosh. CSW confirmed with SNF that patient is long term resident. CSW to continue to follow and participate in GOC meeting. CSW to support ith disposition planning.  Employment status:  Retired Office managernsurance information:  Managed Medicare ([employer]) PT Recommendations:  Not assessed at this time Information / Referral to  community resources:  Skilled Nursing Facility  Patient/Family's Response to care: Patient is disoriented. Family has been in disagreement about GOC.  Patient/Family's Understanding of and Emotional Response to Diagnosis, Current Treatment, and Prognosis: Family disagreeing about GOC; son prefers aggressive treatment and daughters prefer comfort care. Daughters understanding of prognosis and patient's level of pain. Daughter, Jennette KettleDeanna, indicated Sharia ReeveJosh now ok with comfort measures. CSW to confirm in palliative GOC meeting today.  Emotional Assessment Appearance:  Appears stated age Attitude/Demeanor/Rapport:  Unable to Assess (disoriented) Affect (typically observed):  Unable to Assess (disoriented) Orientation:  Oriented to Self Alcohol / Substance use:  Not Applicable Psych involvement (Current and /or in the community):  No (Comment)  Discharge Needs  Concerns to be addressed:  Discharge Planning Concerns Readmission within the last 30 days:  No Current discharge risk:  Chronically ill, Cognitively Impaired, Dependent with Mobility Barriers to Discharge:  Continued Medical Work up, Family Issues   Abigail ButtsSusan Tura Roller, LCSW 07/14/2017, 10:34 AM

## 2017-07-15 DIAGNOSIS — D649 Anemia, unspecified: Secondary | ICD-10-CM

## 2017-07-15 MED ORDER — LORAZEPAM 2 MG/ML IJ SOLN
INTRAMUSCULAR | Status: AC
  Filled 2017-07-15: qty 1

## 2017-07-15 MED ORDER — GABAPENTIN 100 MG PO CAPS: 100.0000 mg | ORAL_CAPSULE | Freq: Two times a day (BID) | ORAL | Status: AC

## 2017-07-15 MED ORDER — ONDANSETRON HCL 4 MG/2ML IJ SOLN: 4.0000 mg | Freq: Four times a day (QID) | INTRAMUSCULAR | 0 refills | Status: AC | PRN

## 2017-07-15 MED ORDER — LORAZEPAM 2 MG/ML IJ SOLN
0.5000 mg | INTRAMUSCULAR | Status: AC
Start: 2017-07-15 — End: 2017-07-15
  Administered 2017-07-15: 0.5 mg via INTRAVENOUS

## 2017-07-15 MED ORDER — MORPHINE SULFATE (PF) 2 MG/ML IV SOLN
1.0000 mg | INTRAVENOUS | 0 refills | Status: AC | PRN
Start: 2017-07-15 — End: ?

## 2017-07-15 MED ORDER — LORAZEPAM 2 MG/ML IJ SOLN: 0.5000 mg | INTRAMUSCULAR | 0 refills | Status: AC

## 2017-07-15 MED ORDER — DIPHENHYDRAMINE HCL 50 MG/ML IJ SOLN: 25.0000 mg | Freq: Four times a day (QID) | INTRAMUSCULAR | 0 refills | Status: AC | PRN

## 2017-07-15 MED ORDER — LORAZEPAM 2 MG/ML IJ SOLN: 0.5000 mg | Freq: Four times a day (QID) | INTRAMUSCULAR | Status: DC | PRN

## 2017-07-15 NOTE — Consult Note (Signed)
   [facility] Consult   07/15/2017  [name] 1946-06-17 [id number]    [facility].   Chart reviewed. Noted [name] has full comfort measures. Disposition plans to be residential hospice.   Will update [facility].    Raiford NobleAtika Keelin Sheridan, MSN-Ed, RN,BSN [facility] Care [facility] Hospital Liaison [phone]

## 2017-07-15 NOTE — Discharge Summary (Signed)
Physician Discharge Summary  Jesus [name]  ZOX:[id number]  DOB: 1946-11-14  DOA: 07/07/2017 PCP: Gerre Pebbles, [name]  Admit date: 07/07/2017 Discharge date: 07/15/2017  Admitted From: SNF Disposition: Hospice   Discharge Condition: Hospice, life expectancy less than 6 months  CODE STATUS: DNR  Diet recommendation: Dysphagia   Brief/Interim Summary:  For further details see H&P and hospital progress note but in brief. Jesus [name] is a 71 year old male with history of CHF, A. 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 Patient was found to sepsis and admitted for MRSA bacteremia started on IV vancomycin, ID was consulted and also found to have symptomatic anemia. During hospital stay patient went under respiratory distress and was placed on BiPAP. Given poor recovery and poor prognosis, palliative care was consulted recommended to place patient on hospital with full comfort measures. Family has agreed making patient for comfort care. Patient will be discharged to hospice for pain symptomatic management.   Subjective: Patient seen and examined complaining pain all over the place, he seems confused at times. No other concerns. Patient remains afebrile.  Discharge Diagnoses/Hospital Course:  Sepsis secondary to MRSA bacteremia, in the setting of bullous pemphigoid - ID following, patient will need 6 weeks of IV vancomycin, op family has decided to make patient comfort care, so will not continue with IV antibiotics at this point. 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  - His pain medications were decreased. Patient was transferred to stepdown unit and was placed on BiPAP.  - Currently alert and awake, intermittently confused. He has not needed BiPAP in over 48 hours.   Sacral decub ulcers, stage IV - Cont wound care  Chronic atrial fibrillation - Heart rate controlled  Acute kidney injury - baseline Cr 0.9-1, upto 1.4 -  was treated with IV fluids with no improvement. - Patient transitioned to comfort care and encourage oral hydration  Essential hypertension - Continue Coreg  Discharge Instructions  You were cared for by a hospitalist during your hospital stay. If you have any questions about your discharge medications or the care you received while you were in the hospital after you are discharged, you can call the unit and asked to speak with the hospitalist on call if the hospitalist that took care of you is not available. Once you are discharged, your primary care physician will handle any further medical issues. Please note that NO REFILLS for any discharge medications will be authorized once you are discharged, as it is imperative that you return to your primary care physician (or establish a relationship with a primary care physician if you do not have one) for your aftercare needs so that they can reassess your need for medications and monitor your lab values.  Discharge Instructions    Call MD for:  difficulty breathing, headache or visual disturbances    Complete by:  As directed  Call MD for:  extreme fatigue    Complete by:  As directed    Call MD for:  hives    Complete by:  As directed    Call MD for:  persistant dizziness or light-headedness    Complete by:  As directed    Call MD for:  persistant  nausea and vomiting    Complete by:  As directed    Call MD for:  redness, tenderness, or signs of infection (pain, swelling, redness, odor or green/yellow discharge around incision site)    Complete by:  As directed    Call MD for:  severe uncontrolled pain    Complete by:  As directed    Call MD for:  temperature >100.4    Complete by:  As directed    Diet - low sodium heart healthy    Complete by:  As directed    Increase activity slowly    Complete by:  As directed      Allergies as of 07/15/2017      Reactions   Amlodipine Besylate Swelling, Other (See Comments)   "started off low and ended up severe; if, in fact, that is what's causing the swelling" (06/03/12)   Levaquin [levofloxacin In D5w] Rash      Medication List    STOP taking these medications   acetaminophen 500 MG tablet Commonly known as:  TYLENOL   apixaban 5 MG Tabs tablet Commonly known as:  ELIQUIS   atorvastatin 80 MG tablet Commonly known as:  LIPITOR   furosemide 40 MG tablet Commonly known as:  LASIX   HYDROcodone-acetaminophen 5-325 MG tablet Commonly known as:  NORCO/VICODIN   loratadine 10 MG tablet Commonly known as:  CLARITIN   memantine 5 MG tablet Commonly known as:  NAMENDA   multivitamin with minerals Tabs tablet   pantoprazole 40 MG tablet Commonly known as:  PROTONIX   potassium chloride SA 20 MEQ tablet Commonly known as:  K-DUR,KLOR-CON   saccharomyces boulardii 250 MG capsule Commonly known as:  FLORASTOR   thiamine 100 MG tablet   vitamin A & D ointment     TAKE these medications   carvedilol 12.5 MG tablet Commonly known as:  COREG Take 12.5 mg by mouth 2 (two) times daily with a meal.   diphenhydrAMINE 50 MG/ML injection Commonly known as:  BENADRYL Inject 0.5-1 mLs (25-50 mg total) into the vein every 6 (six) hours as needed for itching or allergies.   divalproex 250 MG DR tablet Commonly known as:  DEPAKOTE Take 250 mg by mouth 3 (three) times daily.    feeding supplement (PRO-STAT SUGAR FREE 64) Liqd Take 30 mLs by mouth every 6 (six) hours.   gabapentin 100 MG capsule Commonly known as:  NEURONTIN Take 1 capsule (100 mg total) by mouth 2 (two) times daily.   LORazepam 2 MG/ML injection Commonly known as:  ATIVAN Inject 0.25 mLs (0.5 mg total) into the vein every 4 (four) hours.   morphine 2 MG/ML injection Inject 0.5 mLs (1 mg total) into the vein every 2 (two) hours as needed.   ondansetron 4 MG/2ML Soln injection Commonly known as:  ZOFRAN Inject 2 mLs (4 mg total) into the vein every 6 (six) hours as needed for nausea.   predniSONE 5 MG tablet Commonly known as:  DELTASONE Take 5 mg by mouth daily with breakfast.       Allergies  Allergen Reactions  . Amlodipine Besylate Swelling and Other (See Comments)    "  started off low and ended up severe; if, in fact, that is what's causing the swelling" (06/03/12)  . Levaquin [Levofloxacin In D5w] Rash    Consultations:  Palliative care  Infectious disease   Procedures/Studies: No results found. Echocardiogram done on 07/08/2017 Impressions:  - Technically difficult study with poor acoustic windows. The   patient was in atrial fibrillation. Normal LV size with moderate   LV hypertrophy. EF 55-60%. Mildly dilated RV with normal systolic   function. Biatrial enlargement. Dilated IVC suggestive of   elevated RV filling pressure.  Discharge Exam: Vitals:   07/14/17 2044 07/15/17 0143  BP: 125/64 119/66  Pulse: 70 60  Resp: 20 (!) 32  Temp: 98 F (36.7 C)    Vitals:   07/14/17 1052 07/14/17 1210 07/14/17 2044 07/15/17 0143  BP: (!) 146/113 (!) 144/81 125/64 119/66  Pulse: 75 64 70 60  Resp:  (!) 22 20 (!) 32  Temp:  97.9 F (36.6 C) 98 F (36.7 C)   TempSrc:  Axillary Axillary   SpO2:      Weight:      Height:        General: Pt is alert, awake, not in acute distress, confused at times Cardiovascular: RRR, S1/S2 +, no rubs, no gallops Respiratory:  CTA bilaterally, no wheezing, no rhonchi Abdominal: Morbid obesity, Soft, NT, ND, bowel sounds + Extremities: 1+ pedal edema bilaterally Skin: Diffuse blistering rash all over body  The results of significant diagnostics from this hospitalization (including imaging, microbiology, ancillary and laboratory) are listed below for reference.     Microbiology: Recent Results (from the past 240 hour(s))  Blood culture (routine x 2)     Status: Abnormal   Collection Time: 07/07/17  7:55 PM  Result Value Ref Range Status   Specimen Description BLOOD RIGHT HAND  Final   Special Requests   Final    BOTTLES DRAWN AEROBIC AND ANAEROBIC Blood Culture adequate volume   Culture  Setup Time   Final    GRAM POSITIVE COCCI IN CLUSTERS AEROBIC BOTTLE ONLY CRITICAL RESULT CALLED TO, READ BACK BY AND VERIFIED WITH: Artelia LarocheM. Turner Pharm.D. 14:55 07/08/17 ([email or web address])    Culture METHICILLIN RESISTANT STAPHYLOCOCCUS AUREUS (A)  Final   Report Status 07/10/2017 FINAL  Final   Organism ID, Bacteria METHICILLIN RESISTANT STAPHYLOCOCCUS AUREUS  Final      Susceptibility   Methicillin resistant staphylococcus aureus - MIC*    CIPROFLOXACIN >=8 RESISTANT Resistant     ERYTHROMYCIN >=8 RESISTANT Resistant     GENTAMICIN <=0.5 SENSITIVE Sensitive     OXACILLIN >=4 RESISTANT Resistant     TETRACYCLINE <=1 SENSITIVE Sensitive     VANCOMYCIN <=0.5 SENSITIVE Sensitive     TRIMETH/SULFA >=320 RESISTANT Resistant     CLINDAMYCIN <=0.25 SENSITIVE Sensitive     RIFAMPIN <=0.5 SENSITIVE Sensitive     Inducible Clindamycin NEGATIVE Sensitive     * METHICILLIN RESISTANT STAPHYLOCOCCUS AUREUS  Blood Culture ID Panel (Reflexed)     Status: Abnormal   Collection Time: 07/07/17  7:55 PM  Result Value Ref Range Status   Enterococcus species NOT DETECTED NOT DETECTED Final   Listeria monocytogenes NOT DETECTED NOT DETECTED Final   Staphylococcus species DETECTED (A) NOT DETECTED Final    Comment: CRITICAL RESULT CALLED TO, READ BACK  BY AND VERIFIED WITH: MMayford Knife. Turner Pharm.D. 14:55 07/08/17 ([email or web address])    Staphylococcus aureus DETECTED (A) NOT DETECTED Final    Comment: Methicillin (oxacillin)-resistant Staphylococcus aureus (MRSA). MRSA is predictably resistant to beta-lactam  antibiotics (except ceftaroline). Preferred therapy is vancomycin unless clinically contraindicated. Patient requires contact precautions if  hospitalized. CRITICAL RESULT CALLED TO, READ BACK BY AND VERIFIED WITH: M. Mayford Knife Pharm.D. 14:55 07/08/17 ([email or web address])    Methicillin resistance DETECTED (A) NOT DETECTED Final    Comment: CRITICAL RESULT CALLED TO, READ BACK BY AND VERIFIED WITH: M. Mayford Knife Pharm.D. 14:55 07/08/17 ([email or web address])    Streptococcus species NOT DETECTED NOT DETECTED Final   Streptococcus agalactiae NOT DETECTED NOT DETECTED Final   Streptococcus pneumoniae NOT DETECTED NOT DETECTED Final   Streptococcus pyogenes NOT DETECTED NOT DETECTED Final   Acinetobacter baumannii NOT DETECTED NOT DETECTED Final   Enterobacteriaceae species NOT DETECTED NOT DETECTED Final   Enterobacter cloacae complex NOT DETECTED NOT DETECTED Final   Escherichia coli NOT DETECTED NOT DETECTED Final   Klebsiella oxytoca NOT DETECTED NOT DETECTED Final   Klebsiella pneumoniae NOT DETECTED NOT DETECTED Final   Proteus species NOT DETECTED NOT DETECTED Final   Serratia marcescens NOT DETECTED NOT DETECTED Final   Haemophilus influenzae NOT DETECTED NOT DETECTED Final   Neisseria meningitidis NOT DETECTED NOT DETECTED Final   Pseudomonas aeruginosa NOT DETECTED NOT DETECTED Final   Candida albicans NOT DETECTED NOT DETECTED Final   Candida glabrata NOT DETECTED NOT DETECTED Final   Candida krusei NOT DETECTED NOT DETECTED Final   Candida parapsilosis NOT DETECTED NOT DETECTED Final   Candida tropicalis NOT DETECTED NOT DETECTED Final  Blood culture (routine x 2)     Status: None   Collection Time: 07/07/17  8:42 PM  Result Value Ref Range Status   Specimen  Description BLOOD RIGHT ANTECUBITAL  Final   Special Requests   Final    BOTTLES DRAWN AEROBIC AND ANAEROBIC Blood Culture adequate volume   Culture NO GROWTH 5 DAYS  Final   Report Status 07/12/2017 FINAL  Final  MRSA PCR Screening     Status: Abnormal   Collection Time: 07/08/17 12:45 AM  Result Value Ref Range Status   MRSA by PCR POSITIVE (A) NEGATIVE Final    Comment:        The GeneXpert MRSA Assay (FDA approved for NASAL specimens only), is one component of a comprehensive MRSA colonization surveillance program. It is not intended to diagnose MRSA infection nor to guide or monitor treatment for MRSA infections. RESULT CALLED TO, READ BACK BY AND VERIFIED WITH: E CASTRO RN [phone] 07/08/17 A BROWNING   Culture, blood (Routine X 2) w Reflex to ID Panel     Status: None   Collection Time: 07/09/17  8:23 AM  Result Value Ref Range Status   Specimen Description BLOOD RIGHT HAND  Final   Special Requests   Final    BOTTLES DRAWN AEROBIC ONLY Blood Culture results may not be optimal due to an inadequate volume of blood received in culture bottles   Culture NO GROWTH 5 DAYS  Final   Report Status 07/14/2017 FINAL  Final  Culture, blood (Routine X 2) w Reflex to ID Panel     Status: None   Collection Time: 07/09/17  8:28 AM  Result Value Ref Range Status   Specimen Description BLOOD RIGHT HAND  Final   Special Requests   Final    BOTTLES DRAWN AEROBIC ONLY Blood Culture results may not be optimal due to an inadequate volume of blood received in culture bottles   Culture NO GROWTH 5 DAYS  Final   Report Status 07/14/2017 FINAL  Final     Labs: BNP (last 3  results)  Recent Labs  11/13/16 1435 11/24/16 1110 01/19/17 1114  BNP 142.6* 247.1* 489.6*   Basic Metabolic Panel:  Recent Labs Lab 07/09/17 0758 07/10/17 0517 07/11/17 1516 07/12/17 0311  NA 138 139 139 141  K 3.9 4.3 4.7 4.5  CL 98* 101 101 103  CO2 30 30 32 31  GLUCOSE 116* 75 67 102*  BUN 26* 26* 29* 32*   CREATININE 1.21 1.32* 1.36* 1.44*  CALCIUM 8.6* 8.8* 9.0 8.9   Liver Function Tests:  Recent Labs Lab 07/10/17 0517  AST 10*  ALT 9*  ALKPHOS 107  BILITOT 0.6  PROT 5.2*  ALBUMIN 2.0*   No results for input(s): LIPASE, AMYLASE in the last 168 hours.  Recent Labs Lab 07/10/17 1041 07/11/17 1215  AMMONIA 43* 78*   CBC:  Recent Labs Lab 07/09/17 0758 07/10/17 0517 07/11/17 1516  WBC 8.4 8.7 9.6  HGB 8.5* 8.5* 8.4*  HCT 26.7* 27.2* 27.5*  MCV 93.0 93.2 95.8  PLT 223 234 237   Cardiac Enzymes: No results for input(s): CKTOTAL, CKMB, CKMBINDEX, TROPONINI in the last 168 hours. BNP: Invalid input(s): POCBNP CBG: No results for input(s): GLUCAP in the last 168 hours. D-Dimer No results for input(s): DDIMER in the last 72 hours. Hgb A1c No results for input(s): HGBA1C in the last 72 hours. Lipid Profile No results for input(s): CHOL, HDL, LDLCALC, TRIG, CHOLHDL, LDLDIRECT in the last 72 hours. Thyroid function studies No results for input(s): TSH, T4TOTAL, T3FREE, THYROIDAB in the last 72 hours.  Invalid input(s): FREET3 Anemia work up No results for input(s): VITAMINB12, FOLATE, FERRITIN, TIBC, IRON, RETICCTPCT in the last 72 hours. Urinalysis    Component Value Date/Time   COLORURINE AMBER (A) 03/12/2017 1238   APPEARANCEUR CLOUDY (A) 03/12/2017 1238   LABSPEC 1.018 03/12/2017 1238   PHURINE 5.0 03/12/2017 1238   GLUCOSEU NEGATIVE 03/12/2017 1238   HGBUR MODERATE (A) 03/12/2017 1238   BILIRUBINUR NEGATIVE 03/12/2017 1238   KETONESUR NEGATIVE 03/12/2017 1238   PROTEINUR 100 (A) 03/12/2017 1238   UROBILINOGEN 4.0 (H) 06/02/2012 1200   NITRITE NEGATIVE 03/12/2017 1238   LEUKOCYTESUR LARGE (A) 03/12/2017 1238   Sepsis Labs Invalid input(s): PROCALCITONIN,  WBC,  LACTICIDVEN Microbiology Recent Results (from the past 240 hour(s))  Blood culture (routine x 2)     Status: Abnormal   Collection Time: 07/07/17  7:55 PM  Result Value Ref Range Status    Specimen Description BLOOD RIGHT HAND  Final   Special Requests   Final    BOTTLES DRAWN AEROBIC AND ANAEROBIC Blood Culture adequate volume   Culture  Setup Time   Final    GRAM POSITIVE COCCI IN CLUSTERS AEROBIC BOTTLE ONLY CRITICAL RESULT CALLED TO, READ BACK BY AND VERIFIED WITH: Artelia Laroche Pharm.D. 14:55 07/08/17 ([email or web address])    Culture METHICILLIN RESISTANT STAPHYLOCOCCUS AUREUS (A)  Final   Report Status 07/10/2017 FINAL  Final   Organism ID, Bacteria METHICILLIN RESISTANT STAPHYLOCOCCUS AUREUS  Final      Susceptibility   Methicillin resistant staphylococcus aureus - MIC*    CIPROFLOXACIN >=8 RESISTANT Resistant     ERYTHROMYCIN >=8 RESISTANT Resistant     GENTAMICIN <=0.5 SENSITIVE Sensitive     OXACILLIN >=4 RESISTANT Resistant     TETRACYCLINE <=1 SENSITIVE Sensitive     VANCOMYCIN <=0.5 SENSITIVE Sensitive     TRIMETH/SULFA >=320 RESISTANT Resistant     CLINDAMYCIN <=0.25 SENSITIVE Sensitive     RIFAMPIN <=0.5 SENSITIVE Sensitive     Inducible Clindamycin  NEGATIVE Sensitive     * METHICILLIN RESISTANT STAPHYLOCOCCUS AUREUS  Blood Culture ID Panel (Reflexed)     Status: Abnormal   Collection Time: 07/07/17  7:55 PM  Result Value Ref Range Status   Enterococcus species NOT DETECTED NOT DETECTED Final   Listeria monocytogenes NOT DETECTED NOT DETECTED Final   Staphylococcus species DETECTED (A) NOT DETECTED Final    Comment: CRITICAL RESULT CALLED TO, READ BACK BY AND VERIFIED WITH: M. Loretha Brasil.D. 14:55 07/08/17 ([email or web address])    Staphylococcus aureus DETECTED (A) NOT DETECTED Final    Comment: Methicillin (oxacillin)-resistant Staphylococcus aureus (MRSA). MRSA is predictably resistant to beta-lactam antibiotics (except ceftaroline). Preferred therapy is vancomycin unless clinically contraindicated. Patient requires contact precautions if  hospitalized. CRITICAL RESULT CALLED TO, READ BACK BY AND VERIFIED WITH: M. Mayford Knife Pharm.D. 14:55 07/08/17 ([email or web address])    Methicillin  resistance DETECTED (A) NOT DETECTED Final    Comment: CRITICAL RESULT CALLED TO, READ BACK BY AND VERIFIED WITH: M. Mayford Knife Pharm.D. 14:55 07/08/17 ([email or web address])    Streptococcus species NOT DETECTED NOT DETECTED Final   Streptococcus agalactiae NOT DETECTED NOT DETECTED Final   Streptococcus pneumoniae NOT DETECTED NOT DETECTED Final   Streptococcus pyogenes NOT DETECTED NOT DETECTED Final   Acinetobacter baumannii NOT DETECTED NOT DETECTED Final   Enterobacteriaceae species NOT DETECTED NOT DETECTED Final   Enterobacter cloacae complex NOT DETECTED NOT DETECTED Final   Escherichia coli NOT DETECTED NOT DETECTED Final   Klebsiella oxytoca NOT DETECTED NOT DETECTED Final   Klebsiella pneumoniae NOT DETECTED NOT DETECTED Final   Proteus species NOT DETECTED NOT DETECTED Final   Serratia marcescens NOT DETECTED NOT DETECTED Final   Haemophilus influenzae NOT DETECTED NOT DETECTED Final   Neisseria meningitidis NOT DETECTED NOT DETECTED Final   Pseudomonas aeruginosa NOT DETECTED NOT DETECTED Final   Candida albicans NOT DETECTED NOT DETECTED Final   Candida glabrata NOT DETECTED NOT DETECTED Final   Candida krusei NOT DETECTED NOT DETECTED Final   Candida parapsilosis NOT DETECTED NOT DETECTED Final   Candida tropicalis NOT DETECTED NOT DETECTED Final  Blood culture (routine x 2)     Status: None   Collection Time: 07/07/17  8:42 PM  Result Value Ref Range Status   Specimen Description BLOOD RIGHT ANTECUBITAL  Final   Special Requests   Final    BOTTLES DRAWN AEROBIC AND ANAEROBIC Blood Culture adequate volume   Culture NO GROWTH 5 DAYS  Final   Report Status 07/12/2017 FINAL  Final  MRSA PCR Screening     Status: Abnormal   Collection Time: 07/08/17 12:45 AM  Result Value Ref Range Status   MRSA by PCR POSITIVE (A) NEGATIVE Final    Comment:        The GeneXpert MRSA Assay (FDA approved for NASAL specimens only), is one component of a comprehensive MRSA colonization surveillance  program. It is not intended to diagnose MRSA infection nor to guide or monitor treatment for MRSA infections. RESULT CALLED TO, READ BACK BY AND VERIFIED WITH: E CASTRO RN [phone] 07/08/17 A BROWNING   Culture, blood (Routine X 2) w Reflex to ID Panel     Status: None   Collection Time: 07/09/17  8:23 AM  Result Value Ref Range Status   Specimen Description BLOOD RIGHT HAND  Final   Special Requests   Final    BOTTLES DRAWN AEROBIC ONLY Blood Culture results may not be optimal due to an inadequate volume of blood received in culture bottles   Culture NO  GROWTH 5 DAYS  Final   Report Status 07/14/2017 FINAL  Final  Culture, blood (Routine X 2) w Reflex to ID Panel     Status: None   Collection Time: 07/09/17  8:28 AM  Result Value Ref Range Status   Specimen Description BLOOD RIGHT HAND  Final   Special Requests   Final    BOTTLES DRAWN AEROBIC ONLY Blood Culture results may not be optimal due to an inadequate volume of blood received in culture bottles   Culture NO GROWTH 5 DAYS  Final   Report Status 07/14/2017 FINAL  Final    Time coordinating discharge: 35 minutes  SIGNED:  Latrelle Dodrill, MD  [facility] 07/15/2017, 1:59 PM  Pager please text page via  [email or web address] Password [email or web address]

## 2017-07-15 NOTE — Progress Notes (Signed)
Transportation came and received report on patient. [facility] Patient IV was removed.

## 2017-07-15 NOTE — Progress Notes (Signed)
[facility]. Anticipated discharge date: 07/15/17 Family notified: [facility] at bedside, [facility] and Surveyor, miningDeanna Transportation by: PTAR   CSW signing off.  Abigail ButtsSusan Mickie Kozikowski, LCSWA  Clinical Social Worker

## 2017-08-08 DEATH — deceased
